# Patient Record
Sex: Female | Born: 1945 | Race: Black or African American | Hispanic: No | Marital: Single | Smoking: Former smoker
Health system: Southern US, Community
[De-identification: ages and names within clinical notes are randomized; demographics above are authoritative.]

## PROBLEM LIST (undated history)

## (undated) DIAGNOSIS — I10 Essential (primary) hypertension: Secondary | ICD-10-CM

## (undated) DIAGNOSIS — I517 Cardiomegaly: Secondary | ICD-10-CM

## (undated) DIAGNOSIS — I739 Peripheral vascular disease, unspecified: Secondary | ICD-10-CM

## (undated) DIAGNOSIS — I422 Other hypertrophic cardiomyopathy: Secondary | ICD-10-CM

## (undated) DIAGNOSIS — M1990 Unspecified osteoarthritis, unspecified site: Secondary | ICD-10-CM

## (undated) DIAGNOSIS — I441 Atrioventricular block, second degree: Secondary | ICD-10-CM

## (undated) DIAGNOSIS — E785 Hyperlipidemia, unspecified: Secondary | ICD-10-CM

## (undated) DIAGNOSIS — I779 Disorder of arteries and arterioles, unspecified: Secondary | ICD-10-CM

## (undated) DIAGNOSIS — Z9289 Personal history of other medical treatment: Secondary | ICD-10-CM

## (undated) DIAGNOSIS — I2510 Atherosclerotic heart disease of native coronary artery without angina pectoris: Secondary | ICD-10-CM

## (undated) DIAGNOSIS — Z950 Presence of cardiac pacemaker: Secondary | ICD-10-CM

## (undated) HISTORY — DX: Personal history of other medical treatment: Z92.89

## (undated) HISTORY — DX: Atrioventricular block, second degree: I44.1

## (undated) HISTORY — PX: ABDOMINAL HYSTERECTOMY: SHX81

## (undated) HISTORY — DX: Cardiomegaly: I51.7

## (undated) HISTORY — DX: Other hypertrophic cardiomyopathy: I42.2

---

## 2004-02-02 ENCOUNTER — Emergency Department: Payer: Self-pay | Admitting: Internal Medicine

## 2004-03-20 ENCOUNTER — Ambulatory Visit: Payer: Self-pay | Admitting: Family Medicine

## 2004-03-22 ENCOUNTER — Ambulatory Visit: Payer: Self-pay | Admitting: Family Medicine

## 2004-05-15 ENCOUNTER — Ambulatory Visit: Payer: Self-pay | Admitting: Neurology

## 2004-10-22 ENCOUNTER — Ambulatory Visit: Payer: Self-pay | Admitting: Family Medicine

## 2004-11-13 ENCOUNTER — Ambulatory Visit: Payer: Self-pay | Admitting: Family Medicine

## 2004-12-20 ENCOUNTER — Ambulatory Visit: Payer: Self-pay | Admitting: Family Medicine

## 2005-03-05 ENCOUNTER — Other Ambulatory Visit: Payer: Self-pay

## 2005-04-07 ENCOUNTER — Inpatient Hospital Stay: Payer: Self-pay | Admitting: Obstetrics and Gynecology

## 2006-03-18 ENCOUNTER — Ambulatory Visit: Payer: Self-pay | Admitting: Family Medicine

## 2006-03-23 ENCOUNTER — Ambulatory Visit: Payer: Self-pay | Admitting: Family Medicine

## 2006-04-22 ENCOUNTER — Other Ambulatory Visit: Payer: Self-pay

## 2006-04-22 ENCOUNTER — Ambulatory Visit: Payer: Self-pay | Admitting: General Surgery

## 2006-04-29 ENCOUNTER — Ambulatory Visit: Payer: Self-pay | Admitting: General Surgery

## 2006-10-23 ENCOUNTER — Ambulatory Visit: Payer: Self-pay | Admitting: Unknown Physician Specialty

## 2016-10-03 ENCOUNTER — Observation Stay: Admit: 2016-10-03 | Payer: No Typology Code available for payment source

## 2016-10-03 ENCOUNTER — Observation Stay
Admission: EM | Admit: 2016-10-03 | Discharge: 2016-10-05 | Disposition: A | Discharge status: Hospice | Payer: No Typology Code available for payment source | Attending: Internal Medicine | Admitting: Internal Medicine

## 2016-10-03 ENCOUNTER — Encounter: Payer: Self-pay | Admitting: Emergency Medicine

## 2016-10-03 ENCOUNTER — Emergency Department: Payer: No Typology Code available for payment source

## 2016-10-03 DIAGNOSIS — I441 Atrioventricular block, second degree: Secondary | ICD-10-CM | POA: Diagnosis not present

## 2016-10-03 DIAGNOSIS — I6523 Occlusion and stenosis of bilateral carotid arteries: Secondary | ICD-10-CM

## 2016-10-03 DIAGNOSIS — N179 Acute kidney failure, unspecified: Secondary | ICD-10-CM | POA: Diagnosis not present

## 2016-10-03 DIAGNOSIS — I700 Atherosclerosis of aorta: Secondary | ICD-10-CM | POA: Diagnosis not present

## 2016-10-03 DIAGNOSIS — Y9389 Activity, other specified: Secondary | ICD-10-CM | POA: Insufficient documentation

## 2016-10-03 DIAGNOSIS — M1990 Unspecified osteoarthritis, unspecified site: Secondary | ICD-10-CM | POA: Insufficient documentation

## 2016-10-03 DIAGNOSIS — Y92481 Parking lot as the place of occurrence of the external cause: Secondary | ICD-10-CM | POA: Insufficient documentation

## 2016-10-03 DIAGNOSIS — E785 Hyperlipidemia, unspecified: Secondary | ICD-10-CM | POA: Insufficient documentation

## 2016-10-03 DIAGNOSIS — Z8673 Personal history of transient ischemic attack (TIA), and cerebral infarction without residual deficits: Secondary | ICD-10-CM | POA: Insufficient documentation

## 2016-10-03 DIAGNOSIS — I119 Hypertensive heart disease without heart failure: Secondary | ICD-10-CM

## 2016-10-03 DIAGNOSIS — R55 Syncope and collapse: Secondary | ICD-10-CM | POA: Diagnosis present

## 2016-10-03 DIAGNOSIS — Z79899 Other long term (current) drug therapy: Secondary | ICD-10-CM | POA: Insufficient documentation

## 2016-10-03 DIAGNOSIS — Z720 Tobacco use: Secondary | ICD-10-CM

## 2016-10-03 DIAGNOSIS — Z7982 Long term (current) use of aspirin: Secondary | ICD-10-CM | POA: Diagnosis not present

## 2016-10-03 DIAGNOSIS — R9431 Abnormal electrocardiogram [ECG] [EKG]: Secondary | ICD-10-CM

## 2016-10-03 DIAGNOSIS — F17210 Nicotine dependence, cigarettes, uncomplicated: Secondary | ICD-10-CM | POA: Diagnosis not present

## 2016-10-03 DIAGNOSIS — Z90710 Acquired absence of both cervix and uterus: Secondary | ICD-10-CM | POA: Insufficient documentation

## 2016-10-03 DIAGNOSIS — J322 Chronic ethmoidal sinusitis: Secondary | ICD-10-CM | POA: Insufficient documentation

## 2016-10-03 DIAGNOSIS — Y998 Other external cause status: Secondary | ICD-10-CM | POA: Diagnosis not present

## 2016-10-03 DIAGNOSIS — I10 Essential (primary) hypertension: Secondary | ICD-10-CM | POA: Insufficient documentation

## 2016-10-03 HISTORY — DX: Unspecified osteoarthritis, unspecified site: M19.90

## 2016-10-03 LAB — URINALYSIS, COMPLETE (UACMP) WITH MICROSCOPIC
Bacteria, UA: NONE SEEN
Bilirubin Urine: NEGATIVE
GLUCOSE, UA: NEGATIVE mg/dL
HGB URINE DIPSTICK: NEGATIVE
KETONES UR: NEGATIVE mg/dL
LEUKOCYTES UA: NEGATIVE
NITRITE: NEGATIVE
PH: 6 (ref 5.0–8.0)
Protein, ur: NEGATIVE mg/dL
Specific Gravity, Urine: 1.011 (ref 1.005–1.030)

## 2016-10-03 LAB — HEMOGLOBIN A1C
HEMOGLOBIN A1C: 5.8 % — AB (ref 4.8–5.6)
Hgb A1c MFr Bld: 5.8 % — ABNORMAL HIGH (ref 4.8–5.6)
Mean Plasma Glucose: 119.76 mg/dL
Mean Plasma Glucose: 119.76 mg/dL

## 2016-10-03 LAB — TSH: TSH: 0.577 u[IU]/mL (ref 0.350–4.500)

## 2016-10-03 LAB — LIPID PANEL
Cholesterol: 275 mg/dL — ABNORMAL HIGH (ref 0–200)
HDL: 54 mg/dL (ref >40)
LDL CALC: 185 mg/dL — AB (ref 0–99)
TRIGLYCERIDES: 178 mg/dL — AB (ref <150)
Total CHOL/HDL Ratio: 5.1 RATIO
VLDL: 36 mg/dL (ref 0–40)

## 2016-10-03 LAB — COMPREHENSIVE METABOLIC PANEL
ALT: 17 U/L (ref 14–54)
AST: 26 U/L (ref 15–41)
Albumin: 4.1 g/dL (ref 3.5–5.0)
Alkaline Phosphatase: 74 U/L (ref 38–126)
Anion gap: 13 (ref 5–15)
BILIRUBIN TOTAL: 1 mg/dL (ref 0.3–1.2)
BUN: 28 mg/dL — AB (ref 6–20)
CO2: 22 mmol/L (ref 22–32)
CREATININE: 1.32 mg/dL — AB (ref 0.44–1.00)
Calcium: 9.7 mg/dL (ref 8.9–10.3)
Chloride: 108 mmol/L (ref 101–111)
GFR calc Af Amer: 45 mL/min — ABNORMAL LOW (ref >60)
GFR, EST NON AFRICAN AMERICAN: 39 mL/min — AB (ref >60)
Glucose, Bld: 183 mg/dL — ABNORMAL HIGH (ref 65–99)
POTASSIUM: 3.7 mmol/L (ref 3.5–5.1)
Sodium: 143 mmol/L (ref 135–145)
TOTAL PROTEIN: 8.1 g/dL (ref 6.5–8.1)

## 2016-10-03 LAB — CBC
HCT: 44 % (ref 35.0–47.0)
HEMOGLOBIN: 14.9 g/dL (ref 12.0–16.0)
MCH: 29.5 pg (ref 26.0–34.0)
MCHC: 33.8 g/dL (ref 32.0–36.0)
MCV: 87.1 fL (ref 80.0–100.0)
Platelets: 327 10*3/uL (ref 150–440)
RBC: 5.05 MIL/uL (ref 3.80–5.20)
RDW: 13.2 % (ref 11.5–14.5)
WBC: 9.7 10*3/uL (ref 3.6–11.0)

## 2016-10-03 LAB — TROPONIN I: TROPONIN I: 0.05 ng/mL — AB (ref <0.03)

## 2016-10-03 MED ORDER — ENOXAPARIN SODIUM 40 MG/0.4ML ~~LOC~~ SOLN
40.0000 mg | SUBCUTANEOUS | Status: DC
  Filled 2016-10-03: qty 0.4

## 2016-10-03 MED ORDER — NICOTINE 21 MG/24HR TD PT24
MEDICATED_PATCH | TRANSDERMAL | Status: AC
  Filled 2016-10-03: qty 1

## 2016-10-03 MED ORDER — ACETAMINOPHEN 650 MG RE SUPP: 650.0000 mg | Freq: Four times a day (QID) | RECTAL | Status: DC | PRN

## 2016-10-03 MED ORDER — NICOTINE 21 MG/24HR TD PT24
21.0000 mg | MEDICATED_PATCH | Freq: Every day | TRANSDERMAL | Status: DC
  Administered 2016-10-03 – 2016-10-05 (×3): 21 mg via TRANSDERMAL
  Filled 2016-10-03 (×2): qty 1

## 2016-10-03 MED ORDER — ONDANSETRON HCL 4 MG PO TABS: 4.0000 mg | ORAL_TABLET | Freq: Four times a day (QID) | ORAL | Status: DC | PRN

## 2016-10-03 MED ORDER — HYDRALAZINE HCL 20 MG/ML IJ SOLN
10.0000 mg | Freq: Four times a day (QID) | INTRAMUSCULAR | Status: DC | PRN
  Administered 2016-10-03 – 2016-10-05 (×3): 10 mg via INTRAVENOUS
  Filled 2016-10-03 (×2): qty 1

## 2016-10-03 MED ORDER — HYDRALAZINE HCL 20 MG/ML IJ SOLN
INTRAMUSCULAR | Status: AC
  Administered 2016-10-04: 10 mg via INTRAVENOUS
  Filled 2016-10-03: qty 1

## 2016-10-03 MED ORDER — ACETAMINOPHEN 325 MG PO TABS
650.0000 mg | ORAL_TABLET | Freq: Four times a day (QID) | ORAL | Status: DC | PRN
  Administered 2016-10-05: 650 mg via ORAL
  Filled 2016-10-03: qty 2

## 2016-10-03 MED ORDER — SODIUM CHLORIDE 0.9 % IV BOLUS (SEPSIS): 500.0000 mL | Freq: Once | INTRAVENOUS | Status: DC

## 2016-10-03 MED ORDER — SODIUM CHLORIDE 0.9 % IV SOLN
1000.0000 mL | Freq: Once | INTRAVENOUS | Status: AC
  Administered 2016-10-03: 1000 mL via INTRAVENOUS

## 2016-10-03 MED ORDER — ONDANSETRON HCL 4 MG/2ML IJ SOLN
4.0000 mg | Freq: Four times a day (QID) | INTRAMUSCULAR | Status: DC | PRN
Start: 2016-10-03 — End: 2016-10-05

## 2016-10-03 MED ORDER — SODIUM CHLORIDE 0.9 % IV SOLN
INTRAVENOUS | Status: DC
  Administered 2016-10-03 – 2016-10-04 (×2): via INTRAVENOUS
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
## 2016-10-03 NOTE — ED Notes (Signed)
Assisted to bathroom. Ambulated with steady gait. NAD
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
## 2016-10-03 NOTE — ED Notes (Signed)
Assisted to bedpan.  Unable to go to bathroom at this time

## 2016-10-03 NOTE — ED Notes (Signed)
Given water, ok per dr Roxan Hockey
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
## 2016-10-04 ENCOUNTER — Observation Stay (HOSPITAL_BASED_OUTPATIENT_CLINIC_OR_DEPARTMENT_OTHER)
Admit: 2016-10-04 | Discharge: 2016-10-04 | Disposition: A | Discharge status: Home / Self Care | Payer: No Typology Code available for payment source | Attending: Internal Medicine | Admitting: Internal Medicine

## 2016-10-04 ENCOUNTER — Observation Stay: Admit: 2016-10-04 | Payer: No Typology Code available for payment source

## 2016-10-04 ENCOUNTER — Observation Stay: Payer: No Typology Code available for payment source

## 2016-10-04 DIAGNOSIS — I6523 Occlusion and stenosis of bilateral carotid arteries: Secondary | ICD-10-CM

## 2016-10-04 DIAGNOSIS — R55 Syncope and collapse: Secondary | ICD-10-CM | POA: Diagnosis not present

## 2016-10-04 DIAGNOSIS — I10 Essential (primary) hypertension: Secondary | ICD-10-CM | POA: Diagnosis not present

## 2016-10-04 DIAGNOSIS — F17200 Nicotine dependence, unspecified, uncomplicated: Secondary | ICD-10-CM

## 2016-10-04 DIAGNOSIS — I119 Hypertensive heart disease without heart failure: Secondary | ICD-10-CM

## 2016-10-04 DIAGNOSIS — I441 Atrioventricular block, second degree: Secondary | ICD-10-CM

## 2016-10-04 LAB — CBC
HCT: 39.5 % (ref 35.0–47.0)
Hemoglobin: 13.4 g/dL (ref 12.0–16.0)
MCH: 29.3 pg (ref 26.0–34.0)
MCHC: 33.9 g/dL (ref 32.0–36.0)
MCV: 86.5 fL (ref 80.0–100.0)
PLATELETS: 261 10*3/uL (ref 150–440)
RBC: 4.57 MIL/uL (ref 3.80–5.20)
RDW: 13.3 % (ref 11.5–14.5)
WBC: 7.5 10*3/uL (ref 3.6–11.0)

## 2016-10-04 LAB — ECHOCARDIOGRAM COMPLETE
Height: 66 in
Weight: 2272 oz

## 2016-10-04 LAB — BASIC METABOLIC PANEL
Anion gap: 6 (ref 5–15)
BUN: 20 mg/dL (ref 6–20)
CHLORIDE: 110 mmol/L (ref 101–111)
CO2: 24 mmol/L (ref 22–32)
CREATININE: 0.56 mg/dL (ref 0.44–1.00)
Calcium: 8.5 mg/dL — ABNORMAL LOW (ref 8.9–10.3)
GFR calc Af Amer: >60 mL/min (ref >60)
GFR calc non Af Amer: >60 mL/min (ref >60)
GLUCOSE: 112 mg/dL — AB (ref 65–99)
Potassium: 3.5 mmol/L (ref 3.5–5.1)
Sodium: 140 mmol/L (ref 135–145)

## 2016-10-04 LAB — TROPONIN I
TROPONIN I: 0.09 ng/mL — AB (ref <0.03)
Troponin I: 0.07 ng/mL (ref <0.03)

## 2016-10-04 MED ORDER — LOSARTAN POTASSIUM 50 MG PO TABS
100.0000 mg | ORAL_TABLET | Freq: Every day | ORAL | Status: DC
  Administered 2016-10-04 – 2016-10-05 (×2): 100 mg via ORAL
  Filled 2016-10-04 (×2): qty 2

## 2016-10-04 MED ORDER — HYDRALAZINE HCL 25 MG PO TABS
25.0000 mg | ORAL_TABLET | Freq: Three times a day (TID) | ORAL | Status: DC
  Administered 2016-10-04 (×2): 25 mg via ORAL
  Filled 2016-10-04 (×3): qty 1

## 2016-10-04 MED ORDER — ROSUVASTATIN CALCIUM 10 MG PO TABS
20.0000 mg | ORAL_TABLET | Freq: Every day | ORAL | Status: DC
  Administered 2016-10-04: 20 mg via ORAL
  Filled 2016-10-04: qty 2

## 2016-10-04 MED ORDER — METOPROLOL TARTRATE 25 MG PO TABS: 25.0000 mg | ORAL_TABLET | Freq: Two times a day (BID) | ORAL | Status: DC

## 2016-10-04 NOTE — Progress Notes (Signed)
DR [name]  And DR Luberta Mutter was made aware of pt change In rhythm. bata-blocker was held and will continue to monitor after reviewing pt strip
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
## 2016-10-04 NOTE — Progress Notes (Signed)
She has episodes of second-degree heart block on telemetr,d/w  Dr. [name].. Continue to monitor. Discontinue beta blocker and patient did not. Beta blocker dose yet.
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
## 2016-10-04 NOTE — Care Management Obs Status (Signed)
MEDICARE OBSERVATION STATUS NOTIFICATION   Patient Details  Name: Shannon Chen MRN: [id number] Date of Birth: 1945/10/22   Medicare Observation Status Notification Given:  Yes    Maley Venezia A, RN 10/04/2016, 4:02 PM

## 2016-10-04 NOTE — Progress Notes (Signed)
DR Luberta Mutter was informed about pt having 5 beat of v-tach , pt asymptomatic and resting at the time of event , no new order will continue to monitor.

## 2016-10-05 ENCOUNTER — Encounter (HOSPITAL_COMMUNITY): Payer: Self-pay | Admitting: Cardiology

## 2016-10-05 ENCOUNTER — Inpatient Hospital Stay (HOSPITAL_COMMUNITY)
Admission: AD | Admit: 2016-10-05 | Discharge: 2016-10-09 | DRG: 244 | Disposition: A | Discharge status: Home Health | Payer: Medicare Other | Source: Other Acute Inpatient Hospital | Attending: Internal Medicine | Admitting: Internal Medicine

## 2016-10-05 DIAGNOSIS — E785 Hyperlipidemia, unspecified: Secondary | ICD-10-CM | POA: Diagnosis present

## 2016-10-05 DIAGNOSIS — R9431 Abnormal electrocardiogram [ECG] [EKG]: Secondary | ICD-10-CM

## 2016-10-05 DIAGNOSIS — K0530 Chronic periodontitis, unspecified: Secondary | ICD-10-CM | POA: Diagnosis present

## 2016-10-05 DIAGNOSIS — I119 Hypertensive heart disease without heart failure: Secondary | ICD-10-CM | POA: Diagnosis present

## 2016-10-05 DIAGNOSIS — R55 Syncope and collapse: Secondary | ICD-10-CM | POA: Diagnosis present

## 2016-10-05 DIAGNOSIS — I10 Essential (primary) hypertension: Secondary | ICD-10-CM | POA: Diagnosis not present

## 2016-10-05 DIAGNOSIS — I441 Atrioventricular block, second degree: Secondary | ICD-10-CM | POA: Diagnosis not present

## 2016-10-05 DIAGNOSIS — R739 Hyperglycemia, unspecified: Secondary | ICD-10-CM | POA: Diagnosis present

## 2016-10-05 DIAGNOSIS — I110 Hypertensive heart disease with heart failure: Secondary | ICD-10-CM | POA: Diagnosis present

## 2016-10-05 DIAGNOSIS — F17210 Nicotine dependence, cigarettes, uncomplicated: Secondary | ICD-10-CM | POA: Diagnosis present

## 2016-10-05 DIAGNOSIS — K089 Disorder of teeth and supporting structures, unspecified: Secondary | ICD-10-CM | POA: Diagnosis not present

## 2016-10-05 DIAGNOSIS — Z8249 Family history of ischemic heart disease and other diseases of the circulatory system: Secondary | ICD-10-CM

## 2016-10-05 DIAGNOSIS — E7800 Pure hypercholesterolemia, unspecified: Secondary | ICD-10-CM | POA: Diagnosis not present

## 2016-10-05 DIAGNOSIS — Z79899 Other long term (current) drug therapy: Secondary | ICD-10-CM | POA: Diagnosis not present

## 2016-10-05 DIAGNOSIS — M264 Malocclusion, unspecified: Secondary | ICD-10-CM | POA: Diagnosis present

## 2016-10-05 DIAGNOSIS — I509 Heart failure, unspecified: Secondary | ICD-10-CM | POA: Diagnosis present

## 2016-10-05 DIAGNOSIS — E860 Dehydration: Secondary | ICD-10-CM | POA: Diagnosis present

## 2016-10-05 DIAGNOSIS — Z720 Tobacco use: Secondary | ICD-10-CM | POA: Diagnosis present

## 2016-10-05 DIAGNOSIS — Z01818 Encounter for other preprocedural examination: Secondary | ICD-10-CM | POA: Diagnosis not present

## 2016-10-05 DIAGNOSIS — Z950 Presence of cardiac pacemaker: Secondary | ICD-10-CM

## 2016-10-05 DIAGNOSIS — I6521 Occlusion and stenosis of right carotid artery: Secondary | ICD-10-CM | POA: Diagnosis not present

## 2016-10-05 DIAGNOSIS — K029 Dental caries, unspecified: Secondary | ICD-10-CM | POA: Diagnosis present

## 2016-10-05 DIAGNOSIS — I6523 Occlusion and stenosis of bilateral carotid arteries: Secondary | ICD-10-CM | POA: Diagnosis present

## 2016-10-05 DIAGNOSIS — K045 Chronic apical periodontitis: Secondary | ICD-10-CM | POA: Diagnosis present

## 2016-10-05 DIAGNOSIS — I442 Atrioventricular block, complete: Secondary | ICD-10-CM | POA: Diagnosis present

## 2016-10-05 HISTORY — DX: Hyperlipidemia, unspecified: E78.5

## 2016-10-05 HISTORY — DX: Disorder of arteries and arterioles, unspecified: I77.9

## 2016-10-05 HISTORY — DX: Essential (primary) hypertension: I10

## 2016-10-05 HISTORY — DX: Peripheral vascular disease, unspecified: I73.9

## 2016-10-05 LAB — CBC
HCT: 40.5 % (ref 36.0–46.0)
Hemoglobin: 13.6 g/dL (ref 12.0–15.0)
MCH: 28.9 pg (ref 26.0–34.0)
MCHC: 33.6 g/dL (ref 30.0–36.0)
MCV: 86.2 fL (ref 78.0–100.0)
PLATELETS: 280 10*3/uL (ref 150–400)
RBC: 4.7 MIL/uL (ref 3.87–5.11)
RDW: 13 % (ref 11.5–15.5)
WBC: 7.2 10*3/uL (ref 4.0–10.5)

## 2016-10-05 LAB — MRSA PCR SCREENING: MRSA BY PCR: NEGATIVE

## 2016-10-05 LAB — CREATININE, SERUM: CREATININE: 0.73 mg/dL (ref 0.44–1.00)

## 2016-10-05 LAB — MAGNESIUM: Magnesium: 2 mg/dL (ref 1.7–2.4)

## 2016-10-05 MED ORDER — NITROGLYCERIN 0.4 MG SL SUBL: 0.4000 mg | SUBLINGUAL_TABLET | SUBLINGUAL | Status: DC | PRN

## 2016-10-05 MED ORDER — HYDRALAZINE HCL 50 MG PO TABS: 100.0000 mg | ORAL_TABLET | Freq: Three times a day (TID) | ORAL | Status: DC

## 2016-10-05 MED ORDER — ONDANSETRON HCL 4 MG/2ML IJ SOLN: 4.0000 mg | Freq: Four times a day (QID) | INTRAMUSCULAR | Status: DC | PRN

## 2016-10-05 MED ORDER — ROSUVASTATIN CALCIUM 20 MG PO TABS: 20.0000 mg | ORAL_TABLET | Freq: Every day | ORAL | 0 refills | Status: DC

## 2016-10-05 MED ORDER — ROSUVASTATIN CALCIUM 20 MG PO TABS
40.0000 mg | ORAL_TABLET | Freq: Every day | ORAL | Status: DC
  Administered 2016-10-05 – 2016-10-09 (×5): 40 mg via ORAL
  Filled 2016-10-05 (×5): qty 2

## 2016-10-05 MED ORDER — LOSARTAN POTASSIUM 50 MG PO TABS
100.0000 mg | ORAL_TABLET | Freq: Every day | ORAL | Status: DC
  Administered 2016-10-05 – 2016-10-07 (×3): 100 mg via ORAL
  Filled 2016-10-05 (×3): qty 2

## 2016-10-05 MED ORDER — HYDRALAZINE HCL 20 MG/ML IJ SOLN
20.0000 mg | Freq: Once | INTRAMUSCULAR | Status: AC
  Administered 2016-10-05: 20 mg via INTRAVENOUS
  Filled 2016-10-05: qty 1

## 2016-10-05 MED ORDER — HYDRALAZINE HCL 25 MG PO TABS: 25.0000 mg | ORAL_TABLET | Freq: Three times a day (TID) | ORAL | 0 refills | Status: DC

## 2016-10-05 MED ORDER — ASPIRIN EC 81 MG PO TBEC
81.0000 mg | DELAYED_RELEASE_TABLET | Freq: Every day | ORAL | Status: DC
  Administered 2016-10-06 – 2016-10-09 (×4): 81 mg via ORAL
  Filled 2016-10-05 (×4): qty 1

## 2016-10-05 MED ORDER — HYDROCODONE-ACETAMINOPHEN 5-325 MG PO TABS: 1.0000 | ORAL_TABLET | ORAL | Status: DC | PRN

## 2016-10-05 MED ORDER — NICOTINE 21 MG/24HR TD PT24
21.0000 mg | MEDICATED_PATCH | TRANSDERMAL | Status: DC
  Administered 2016-10-05 – 2016-10-09 (×5): 21 mg via TRANSDERMAL
  Filled 2016-10-05 (×5): qty 1

## 2016-10-05 MED ORDER — HYDRALAZINE HCL 50 MG PO TABS
50.0000 mg | ORAL_TABLET | Freq: Four times a day (QID) | ORAL | Status: DC
Start: 2016-10-05 — End: 2016-10-08
  Administered 2016-10-05 – 2016-10-08 (×10): 50 mg via ORAL
  Filled 2016-10-05 (×10): qty 1

## 2016-10-05 MED ORDER — ASPIRIN 81 MG PO CHEW
324.0000 mg | CHEWABLE_TABLET | ORAL | Status: AC
  Filled 2016-10-05: qty 4

## 2016-10-05 MED ORDER — LOSARTAN POTASSIUM 100 MG PO TABS: 100.0000 mg | ORAL_TABLET | Freq: Every day | ORAL | 0 refills | Status: DC

## 2016-10-05 MED ORDER — HYDRALAZINE HCL 20 MG/ML IJ SOLN
10.0000 mg | Freq: Four times a day (QID) | INTRAMUSCULAR | Status: DC | PRN
  Administered 2016-10-06 – 2016-10-08 (×2): 10 mg via INTRAVENOUS
  Filled 2016-10-05 (×2): qty 1

## 2016-10-05 MED ORDER — ASPIRIN EC 81 MG PO TBEC: 81.0000 mg | DELAYED_RELEASE_TABLET | Freq: Every day | ORAL | 2 refills | Status: AC

## 2016-10-05 MED ORDER — ENOXAPARIN SODIUM 40 MG/0.4ML ~~LOC~~ SOLN
40.0000 mg | SUBCUTANEOUS | Status: DC
  Administered 2016-10-05: 40 mg via SUBCUTANEOUS
  Filled 2016-10-05: qty 0.4

## 2016-10-05 MED ORDER — ASPIRIN 300 MG RE SUPP: 300.0000 mg | RECTAL | Status: AC

## 2016-10-05 MED ORDER — ZOLPIDEM TARTRATE 5 MG PO TABS: 5.0000 mg | ORAL_TABLET | Freq: Every evening | ORAL | Status: DC | PRN

## 2016-10-05 MED ORDER — ACETAMINOPHEN 325 MG PO TABS: 650.0000 mg | ORAL_TABLET | ORAL | Status: DC | PRN
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
## 2016-10-05 NOTE — Progress Notes (Signed)
Nursing informed of an 8-Beat run of V-Tach per CCMD. MD notified and new order to check mag level placed. Nursing will continue to assess.

## 2016-10-06 ENCOUNTER — Inpatient Hospital Stay (HOSPITAL_COMMUNITY): Payer: Medicare Other

## 2016-10-06 ENCOUNTER — Encounter (HOSPITAL_COMMUNITY): Payer: Self-pay | Admitting: Dentistry

## 2016-10-06 DIAGNOSIS — I10 Essential (primary) hypertension: Secondary | ICD-10-CM

## 2016-10-06 DIAGNOSIS — R55 Syncope and collapse: Secondary | ICD-10-CM

## 2016-10-06 DIAGNOSIS — Z01818 Encounter for other preprocedural examination: Secondary | ICD-10-CM

## 2016-10-06 DIAGNOSIS — I6523 Occlusion and stenosis of bilateral carotid arteries: Secondary | ICD-10-CM

## 2016-10-06 DIAGNOSIS — I441 Atrioventricular block, second degree: Secondary | ICD-10-CM

## 2016-10-06 DIAGNOSIS — I6521 Occlusion and stenosis of right carotid artery: Secondary | ICD-10-CM

## 2016-10-06 LAB — BASIC METABOLIC PANEL
ANION GAP: 6 (ref 5–15)
BUN: 20 mg/dL (ref 6–20)
CHLORIDE: 108 mmol/L (ref 101–111)
CO2: 27 mmol/L (ref 22–32)
Calcium: 8.9 mg/dL (ref 8.9–10.3)
Creatinine, Ser: 0.81 mg/dL (ref 0.44–1.00)
GFR calc non Af Amer: >60 mL/min (ref >60)
GLUCOSE: 124 mg/dL — AB (ref 65–99)
POTASSIUM: 3.8 mmol/L (ref 3.5–5.1)
Sodium: 141 mmol/L (ref 135–145)

## 2016-10-06 LAB — GLUCOSE, CAPILLARY: Glucose-Capillary: 107 mg/dL — ABNORMAL HIGH (ref 65–99)

## 2016-10-06 LAB — PROTIME-INR
INR: 1.04
PROTHROMBIN TIME: 13.6 s (ref 11.4–15.2)

## 2016-10-06 MED ORDER — AMLODIPINE BESYLATE 5 MG PO TABS
5.0000 mg | ORAL_TABLET | Freq: Every day | ORAL | Status: DC
  Administered 2016-10-06 – 2016-10-07 (×2): 5 mg via ORAL
  Filled 2016-10-06 (×2): qty 1

## 2016-10-06 MED ORDER — CEFAZOLIN SODIUM-DEXTROSE 2-4 GM/100ML-% IV SOLN: 2.0000 g | INTRAVENOUS | Status: DC

## 2016-10-06 MED ORDER — SODIUM CHLORIDE 0.9% FLUSH
3.0000 mL | Freq: Two times a day (BID) | INTRAVENOUS | Status: DC
  Administered 2016-10-07: 3 mL via INTRAVENOUS

## 2016-10-06 MED ORDER — SODIUM CHLORIDE 0.9% FLUSH: 3.0000 mL | INTRAVENOUS | Status: DC | PRN

## 2016-10-06 MED ORDER — CHLORHEXIDINE GLUCONATE 4 % EX LIQD: 60.0000 mL | Freq: Once | CUTANEOUS | Status: AC

## 2016-10-06 MED ORDER — CHLORHEXIDINE GLUCONATE 4 % EX LIQD
60.0000 mL | Freq: Once | CUTANEOUS | Status: AC
  Filled 2016-10-06: qty 60

## 2016-10-06 MED ORDER — SODIUM CHLORIDE 0.9 % IV SOLN
INTRAVENOUS | Status: DC
  Administered 2016-10-06: 09:00:00 via INTRAVENOUS
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
## 2016-10-06 NOTE — Progress Notes (Signed)
Cardiology paged and made aware of Shannon Chen pauses. 3.7 second and 3.6 second pause around 2330 on 10/05/16. Shortly followed by a 5 second pause at 0112. EKG obtained. Patient is asymptomatic. Will continue to closely monitor.

## 2016-10-07 ENCOUNTER — Encounter (HOSPITAL_COMMUNITY)
Admission: AD | Disposition: A | Discharge status: Home Health | Payer: Self-pay | Source: Other Acute Inpatient Hospital | Attending: Internal Medicine

## 2016-10-07 DIAGNOSIS — E7800 Pure hypercholesterolemia, unspecified: Secondary | ICD-10-CM

## 2016-10-07 SURGERY — PACEMAKER IMPLANT

## 2016-10-07 MED ORDER — SODIUM CHLORIDE 0.9 % IV SOLN: 250.0000 mL | INTRAVENOUS | Status: DC

## 2016-10-07 MED ORDER — AMLODIPINE BESYLATE 10 MG PO TABS
10.0000 mg | ORAL_TABLET | Freq: Every day | ORAL | Status: DC
  Administered 2016-10-08 – 2016-10-09 (×2): 10 mg via ORAL
  Filled 2016-10-07 (×2): qty 1

## 2016-10-07 MED ORDER — SODIUM CHLORIDE 0.9 % IR SOLN: 80.0000 mg | Status: DC

## 2016-10-07 MED ORDER — AMLODIPINE BESYLATE 5 MG PO TABS
5.0000 mg | ORAL_TABLET | Freq: Once | ORAL | Status: AC
  Administered 2016-10-07: 5 mg via ORAL
  Filled 2016-10-07: qty 1

## 2016-10-07 NOTE — Progress Notes (Signed)
Progress Note  Patient Name: Shannon Chen Date of Encounter: 10/07/2016  Primary Cardiologist: Dr. [name] Primary Electrophysiologist: Dr. Elberta Fortis  Subjective   Feeling overwhelmed but feeling well physically.  She denies chest pain or shortness of breath.  No dizziness or palpitaitons.   Inpatient Medications    Scheduled Meds: . amLODipine  5 mg Oral Daily  . aspirin EC  81 mg Oral Daily  . gentamicin irrigation  80 mg Irrigation To SSTC  . hydrALAZINE  50 mg Oral Q6H  . losartan  100 mg Oral Daily  . nicotine  21 mg Transdermal Q24H  . rosuvastatin  40 mg Oral q1800  . sodium chloride flush  3 mL Intravenous Q12H   Continuous Infusions: . sodium chloride 50 mL/hr at 10/07/16 0800  . sodium chloride Stopped (10/07/16 0522)  .  ceFAZolin (ANCEF) IV     PRN Meds: acetaminophen, hydrALAZINE, HYDROcodone-acetaminophen, nitroGLYCERIN, ondansetron (ZOFRAN) IV, sodium chloride flush, zolpidem   Vital Signs    Vitals:   10/06/16 2304 10/06/16 2321 10/07/16 0335 10/07/16 0745  BP: (!) 170/71 (!) 150/74 (!) 143/59 (!) 161/78  Pulse: 71  95 87  Resp: 20  17 16   Temp: 98 F (36.7 C)  97.9 F (36.6 C) 98.7 F (37.1 C)  TempSrc: Oral  Oral Oral  SpO2: 97%  100% 100%  Weight:      Height:        Intake/Output Summary (Last 24 hours) at 10/07/16 0916 Last data filed at 10/07/16 0519  Gross per 24 hour  Intake             1475 ml  Output              475 ml  Net             1000 ml   Filed Weights   10/05/16 1622  Weight: 73.3 kg (161 lb 9.6 oz)    Telemetry    Sinus rhythm.  Mobitz II - Personally Reviewed  ECG    Sinus rhythm. Rate 91 bpm.  LVH with repolarization abnormalities  - Personally Reviewed  Physical Exam   GEN: No acute distress.   HEENT: Poor dentition Neck: No JVD Cardiac: RRR, no murmurs, rubs, or gallops.  Respiratory: Clear to auscultation bilaterally. GI: Soft, nontender, non-distended  MS: No edema; No deformity. Neuro:   Nonfocal  Psych: Normal affect   Labs    Chemistry Recent Labs Lab 10/03/16 1422 10/04/16 0637 10/05/16 1516 10/06/16 0442  NA 143 140  --  141  K 3.7 3.5  --  3.8  CL 108 110  --  108  CO2 22 24  --  27  GLUCOSE 183* 112*  --  124*  BUN 28* 20  --  20  CREATININE 1.32* 0.56 0.73 0.81  CALCIUM 9.7 8.5*  --  8.9  PROT 8.1  --   --   --   ALBUMIN 4.1  --   --   --   AST 26  --   --   --   ALT 17  --   --   --   ALKPHOS 74  --   --   --   BILITOT 1.0  --   --   --   GFRNONAA 39* >60 >60 >60  GFRAA 45* >60 >60 >60  ANIONGAP 13 6  --  6     Hematology Recent Labs Lab 10/03/16 1422 10/04/16 6811 10/05/16 1516  WBC 9.7 7.5 7.2  RBC 5.05 4.57 4.70  HGB 14.9 13.4 13.6  HCT 44.0 39.5 40.5  MCV 87.1 86.5 86.2  MCH 29.5 29.3 28.9  MCHC 33.8 33.9 33.6  RDW 13.2 13.3 13.0  PLT 327 261 280    Cardiac Enzymes Recent Labs Lab 10/03/16 1422 10/03/16 1841 10/04/16 0033 10/04/16 0637  TROPONINI <0.03 0.05* 0.09* 0.07*   No results for input(s): TROPIPOC in the last 168 hours.   BNPNo results for input(s): BNP, PROBNP in the last 168 hours.   DDimer No results for input(s): DDIMER in the last 168 hours.   Radiology    Dg Orthopantogram  Result Date: 10/06/2016 CLINICAL DATA:  Poor dentition EXAM: ORTHOPANTOGRAM/PANORAMIC COMPARISON:  None. FINDINGS: Multiple missing teeth. Caries involving teeth numbers 5, 20, and 27. No definite periapical lucency to suggest abscess. Mandible unremarkable. IMPRESSION: 1. Multiple missing teeth and caries as above. Electronically Signed   By: Corlis Leak M.D.   On: 10/06/2016 13:04    Cardiac Studies   Echo 10/04/16: Study Conclusions  - Left ventricle: The cavity size was normal. There was moderate   concentric hypertrophy. Cavity obliteration in systole. Unable to   exclude LVOT gradient. Systolic function was normal. The   estimated ejection fraction was in the range of 60% to 65%. Wall   motion was normal; there were no  regional wall motion   abnormalities. Doppler parameters are consistent with abnormal   left ventricular relaxation (grade 1 diastolic dysfunction). - Mitral valve: Calcified annulus. - Left atrium: The atrium was normal in size. - Right ventricle: Systolic function was normal. - Pulmonary arteries: Systolic pressure could not be accurately   estimated. - Inferior vena cava: The vessel was small size. . The   respirophasic diameter changes were >= 50%, consistent with low   central venous pressure.  Carotid Doppler 10/04/16: L 50-69%, R 70-99%  Patient Profile     [name] is a 56F with hypertension, hyeprlipidemia and carotid stenosis here with syncope  Assessment & Plan    # Syncope: # High degree AV block: Plan for PPM after dental extractions.  Currently stable.   # Carotid Stenosis:  Neck CT-A pending.  Vascular surgery following and will make recommendations after CT.  # Hypertension:  Blood pressure remains poorly-controlled.  Increase amlodipine to 10mg .  Continue hydralazine and losartan.   # Hyperlipidemia: Rosuvastatin started this admission.  Check lipids and CMP in 6 weeks.   Signed, Chilton Si, MD  10/07/2016, 9:16 AM

## 2016-10-08 ENCOUNTER — Encounter (HOSPITAL_COMMUNITY)
Admission: AD | Disposition: A | Discharge status: Home Health | Payer: Self-pay | Source: Other Acute Inpatient Hospital | Attending: Internal Medicine

## 2016-10-08 ENCOUNTER — Inpatient Hospital Stay (HOSPITAL_COMMUNITY): Payer: Medicare Other

## 2016-10-08 DIAGNOSIS — I119 Hypertensive heart disease without heart failure: Secondary | ICD-10-CM

## 2016-10-08 DIAGNOSIS — K089 Disorder of teeth and supporting structures, unspecified: Secondary | ICD-10-CM

## 2016-10-08 DIAGNOSIS — I441 Atrioventricular block, second degree: Secondary | ICD-10-CM

## 2016-10-08 DIAGNOSIS — Z720 Tobacco use: Secondary | ICD-10-CM

## 2016-10-08 HISTORY — PX: PACEMAKER IMPLANT: EP1218

## 2016-10-08 SURGERY — PACEMAKER IMPLANT

## 2016-10-08 MED ORDER — SODIUM CHLORIDE 0.9 % IR SOLN
Status: AC
  Filled 2016-10-08: qty 2

## 2016-10-08 MED ORDER — ASPIRIN EC 81 MG PO TBEC: 81.0000 mg | DELAYED_RELEASE_TABLET | Freq: Every day | ORAL | Status: DC

## 2016-10-08 MED ORDER — HYDRALAZINE HCL 50 MG PO TABS: 25.0000 mg | ORAL_TABLET | Freq: Three times a day (TID) | ORAL | Status: DC

## 2016-10-08 MED ORDER — CHLORHEXIDINE GLUCONATE 4 % EX LIQD
CUTANEOUS | Status: AC
  Filled 2016-10-08: qty 15

## 2016-10-08 MED ORDER — HEPARIN (PORCINE) IN NACL 2-0.9 UNIT/ML-% IJ SOLN
INTRAMUSCULAR | Status: AC | PRN
  Administered 2016-10-08: 500 mL

## 2016-10-08 MED ORDER — LOSARTAN POTASSIUM 50 MG PO TABS
100.0000 mg | ORAL_TABLET | Freq: Every day | ORAL | Status: DC
  Administered 2016-10-09: 100 mg via ORAL
  Filled 2016-10-08: qty 2

## 2016-10-08 MED ORDER — IOPAMIDOL (ISOVUE-370) INJECTION 76%
INTRAVENOUS | Status: AC
  Administered 2016-10-08: 50 mL via INTRAVENOUS
  Filled 2016-10-08: qty 50

## 2016-10-08 MED ORDER — CEFAZOLIN SODIUM-DEXTROSE 2-4 GM/100ML-% IV SOLN
2.0000 g | INTRAVENOUS | Status: AC
  Administered 2016-10-08: 2 g via INTRAVENOUS

## 2016-10-08 MED ORDER — SODIUM CHLORIDE 0.9 % IR SOLN: 80.0000 mg | Status: DC

## 2016-10-08 MED ORDER — HYDRALAZINE HCL 50 MG PO TABS
50.0000 mg | ORAL_TABLET | Freq: Once | ORAL | Status: AC
  Administered 2016-10-08: 50 mg via ORAL
  Filled 2016-10-08: qty 1

## 2016-10-08 MED ORDER — LIDOCAINE HCL (PF) 1 % IJ SOLN
INTRAMUSCULAR | Status: DC | PRN
  Administered 2016-10-08: 45 mL via INTRADERMAL

## 2016-10-08 MED ORDER — HEPARIN (PORCINE) IN NACL 2-0.9 UNIT/ML-% IJ SOLN
INTRAMUSCULAR | Status: AC
  Filled 2016-10-08: qty 500

## 2016-10-08 MED ORDER — MIDAZOLAM HCL 5 MG/5ML IJ SOLN
INTRAMUSCULAR | Status: AC
  Filled 2016-10-08: qty 5

## 2016-10-08 MED ORDER — MIDAZOLAM HCL 5 MG/5ML IJ SOLN
INTRAMUSCULAR | Status: DC | PRN
  Administered 2016-10-08 (×4): 1 mg via INTRAVENOUS

## 2016-10-08 MED ORDER — CHLORHEXIDINE GLUCONATE 4 % EX LIQD
60.0000 mL | Freq: Once | CUTANEOUS | Status: AC
  Administered 2016-10-08: 4 via TOPICAL

## 2016-10-08 MED ORDER — SODIUM CHLORIDE 0.9 % IV SOLN
INTRAVENOUS | Status: DC
  Administered 2016-10-08: 12:00:00 via INTRAVENOUS

## 2016-10-08 MED ORDER — CEFAZOLIN SODIUM-DEXTROSE 2-4 GM/100ML-% IV SOLN
INTRAVENOUS | Status: AC
  Filled 2016-10-08: qty 100

## 2016-10-08 MED ORDER — HYDRALAZINE HCL 50 MG PO TABS
100.0000 mg | ORAL_TABLET | Freq: Three times a day (TID) | ORAL | Status: DC
  Administered 2016-10-08 – 2016-10-09 (×3): 100 mg via ORAL
  Filled 2016-10-08 (×4): qty 2

## 2016-10-08 MED ORDER — ACETAMINOPHEN 325 MG PO TABS: 325.0000 mg | ORAL_TABLET | ORAL | Status: DC | PRN

## 2016-10-08 MED ORDER — FENTANYL CITRATE (PF) 100 MCG/2ML IJ SOLN
INTRAMUSCULAR | Status: DC | PRN
  Administered 2016-10-08 (×4): 12.5 ug via INTRAVENOUS

## 2016-10-08 MED ORDER — CHLORHEXIDINE GLUCONATE 4 % EX LIQD: 60.0000 mL | Freq: Once | CUTANEOUS | Status: AC

## 2016-10-08 MED ORDER — LIDOCAINE HCL (PF) 1 % IJ SOLN
INTRAMUSCULAR | Status: AC
  Filled 2016-10-08: qty 60

## 2016-10-08 MED ORDER — FENTANYL CITRATE (PF) 100 MCG/2ML IJ SOLN
INTRAMUSCULAR | Status: AC
  Filled 2016-10-08: qty 2

## 2016-10-08 MED ORDER — ONDANSETRON HCL 4 MG/2ML IJ SOLN: 4.0000 mg | Freq: Four times a day (QID) | INTRAMUSCULAR | Status: DC | PRN

## 2016-10-08 MED ORDER — IRBESARTAN 150 MG PO TABS
300.0000 mg | ORAL_TABLET | Freq: Every day | ORAL | Status: DC
  Administered 2016-10-08: 300 mg via ORAL
  Filled 2016-10-08: qty 2

## 2016-10-08 MED ORDER — ROSUVASTATIN CALCIUM 20 MG PO TABS: 20.0000 mg | ORAL_TABLET | Freq: Every day | ORAL | Status: DC

## 2016-10-08 MED ORDER — CEFAZOLIN SODIUM-DEXTROSE 1-4 GM/50ML-% IV SOLN
1.0000 g | Freq: Four times a day (QID) | INTRAVENOUS | Status: AC
  Administered 2016-10-09 (×3): 1 g via INTRAVENOUS
  Filled 2016-10-08 (×6): qty 50

## 2016-10-08 SURGICAL SUPPLY — 12 items
CABLE SURGICAL S-101-97-12 (CABLE) ×3 IMPLANT
CATH RIGHTSITE C315HIS02 (CATHETERS) ×3 IMPLANT
IPG PACE AZUR XT DR MRI W1DR01 (Pacemaker) ×1 IMPLANT
LEAD CAPSURE NOVUS 5076-52CM (Lead) ×3 IMPLANT
LEAD SELECT SECURE 3830 383069 (Lead) ×1 IMPLANT
PACE AZURE XT DR MRI W1DR01 (Pacemaker) ×3 IMPLANT
PAD DEFIB LIFELINK (PAD) ×3 IMPLANT
SELECT SECURE 3830 383069 (Lead) ×3 IMPLANT
SHEATH CLASSIC 7F (SHEATH) ×6 IMPLANT
SLITTER 6232ADJ (MISCELLANEOUS) ×3 IMPLANT
TRAY PACEMAKER INSERTION (PACKS) ×3 IMPLANT
WIRE HI TORQ VERSACORE-J 145CM (WIRE) ×3 IMPLANT

## 2016-10-08 NOTE — Progress Notes (Signed)
Prep. done on the chest with hibiclens,tol.well.

## 2016-10-08 NOTE — Progress Notes (Addendum)
Back from the ep lab by bed. Dressing to left upper chest pacemaker site clean and dry. Bedrest emphasized, left arm sling in placed.

## 2016-10-08 NOTE — Progress Notes (Signed)
Progress Note  Patient Name: Shannon Chen Date of Encounter: 10/08/2016  Primary Cardiologist: Dr. Mariah Chen Primary Electrophysiologist: Dr. Elberta Chen  Subjective   Feeling well.  Hungry.  Otherwise no complaints.    Inpatient Medications    Scheduled Meds: . amLODipine  10 mg Oral Daily  . aspirin EC  81 mg Oral Daily  . hydrALAZINE  50 mg Oral Q6H  . losartan  100 mg Oral Daily  . nicotine  21 mg Transdermal Q24H  . rosuvastatin  40 mg Oral q1800   Continuous Infusions:  PRN Meds: acetaminophen, hydrALAZINE, HYDROcodone-acetaminophen, nitroGLYCERIN, ondansetron (ZOFRAN) IV, zolpidem   Vital Signs    Vitals:   10/08/16 0330 10/08/16 0409 10/08/16 0740 10/08/16 0742  BP: (!) 196/81 (!) 152/65 (!) 159/72   Pulse:   78   Resp: 17  18   Temp: 98 F (36.7 C)   97.8 F (36.6 C)  TempSrc: Oral   Axillary  SpO2: 98%  94%   Weight:      Height:        Intake/Output Summary (Last 24 hours) at 10/08/16 0809 Last data filed at 10/08/16 1117  Gross per 24 hour  Intake              830 ml  Output              950 ml  Net             -120 ml   Filed Weights   10/05/16 1622  Weight: 73.3 kg (161 lb 9.6 oz)    Telemetry    Sinus rhythm.  Mobitz II.  Episodes with up to 2 dropped beats.  - Personally Reviewed  ECG    Sinus rhythm. Rate 91 bpm.  LVH with repolarization abnormalities  - Personally Reviewed  Physical Exam   VS:  BP (!) 159/72 (BP Location: Right Arm)   Pulse 78   Temp 97.8 F (36.6 C) (Axillary)   Resp 18   Ht 5\' 6"  (1.676 m)   Wt 73.3 kg (161 lb 9.6 oz)   SpO2 94%   BMI 26.08 kg/m  , BMI Body mass index is 26.08 kg/m. GENERAL:  Well appearing.  No acute distress HEENT: Pupils equal round and reactive, fundi not visualized, oral mucosa unremarkable.  Poor dentition.  Several missing teeth. NECK:  No jugular venous distention, waveform within normal limits, carotid upstroke brisk and symmetric, no bruits LUNGS:  Clear to auscultation  bilaterally HEART:  RRR.  PMI not displaced or sustained,S1 and S2 within normal limits, no S3, no S4, no clicks, no rubs, II/VI systolic murmur ABD:  Flat, positive bowel sounds normal in frequency in pitch, no bruits, no rebound, no guarding, no midline pulsatile mass, no hepatomegaly, no splenomegaly EXT:  2 plus pulses throughout, no edema, no cyanosis no clubbing SKIN:  No rashes no nodules.  Large lipoma on back  NEURO:  Cranial nerves II through XII grossly intact, motor grossly intact throughout [facility]:  Cognitively intact, oriented to person place and time   Labs    Chemistry  Recent Labs Lab 10/03/16 1422 10/04/16 0637 10/05/16 1516 10/06/16 0442  NA 143 140  --  141  K 3.7 3.5  --  3.8  CL 108 110  --  108  CO2 22 24  --  27  GLUCOSE 183* 112*  --  124*  BUN 28* 20  --  20  CREATININE 1.32* 0.56 0.73 0.81  CALCIUM  9.7 8.5*  --  8.9  PROT 8.1  --   --   --   ALBUMIN 4.1  --   --   --   AST 26  --   --   --   ALT 17  --   --   --   ALKPHOS 74  --   --   --   BILITOT 1.0  --   --   --   GFRNONAA 39* >60 >60 >60  GFRAA 45* >60 >60 >60  ANIONGAP 13 6  --  6     Hematology  Recent Labs Lab 10/03/16 1422 10/04/16 0637 10/05/16 1516  WBC 9.7 7.5 7.2  RBC 5.05 4.57 4.70  HGB 14.9 13.4 13.6  HCT 44.0 39.5 40.5  MCV 87.1 86.5 86.2  MCH 29.5 29.3 28.9  MCHC 33.8 33.9 33.6  RDW 13.2 13.3 13.0  PLT 327 261 280    Cardiac Enzymes  Recent Labs Lab 10/03/16 1422 10/03/16 1841 10/04/16 0033 10/04/16 0637  TROPONINI <0.03 0.05* 0.09* 0.07*   No results for input(s): TROPIPOC in the last 168 hours.   BNPNo results for input(s): BNP, PROBNP in the last 168 hours.   DDimer No results for input(s): DDIMER in the last 168 hours.   Radiology    Dg Orthopantogram  Result Date: 10/06/2016 CLINICAL DATA:  Poor dentition EXAM: ORTHOPANTOGRAM/PANORAMIC COMPARISON:  None. FINDINGS: Multiple missing teeth. Caries involving teeth numbers 5, 20, and 27. No definite  periapical lucency to suggest abscess. Mandible unremarkable. IMPRESSION: 1. Multiple missing teeth and caries as above. Electronically Signed   By: Corlis Leak M.D.   On: 10/06/2016 13:04    Cardiac Studies   Echo 10/04/16: Study Conclusions  - Left ventricle: The cavity size was normal. There was moderate   concentric hypertrophy. Cavity obliteration in systole. Unable to   exclude LVOT gradient. Systolic function was normal. The   estimated ejection fraction was in the range of 60% to 65%. Wall   motion was normal; there were no regional wall motion   abnormalities. Doppler parameters are consistent with abnormal   left ventricular relaxation (grade 1 diastolic dysfunction). - Mitral valve: Calcified annulus. - Left atrium: The atrium was normal in size. - Right ventricle: Systolic function was normal. - Pulmonary arteries: Systolic pressure could not be accurately   estimated. - Inferior vena cava: The vessel was small size. . The   respirophasic diameter changes were >= 50%, consistent with low   central venous pressure.  Carotid Doppler 10/04/16: L 50-69%, R 70-99%  Patient Profile     Shannon Chen is a 83F with hypertension, hyeprlipidemia and carotid stenosis here with syncope  Assessment & Plan    # Syncope: # High degree AV block: Plan for PPM after dental extractions.  Currently stable.   # Carotid Stenosis:  Neck CT-A pending.  Apparently this didn't happen because they were unable to place a large bore IV.  Will ask IV team to return and try again.  Vascular surgery following.  She will likely need R CEA.  They will make recommendations regarding timing after the CT.  Continue aspirin and rosuvastatin.   # Hypertension:  Blood pressure remains poorly-controlled.  Increase hydralazine to 100mg  q8h.  Continue amlodipine and switch losartan to irbesartan 300 mg daily.   # Hyperlipidemia: Rosuvastatin started this admission.  Check lipids and CMP in 6 weeks.    Signed, Chilton Si, MD  10/08/2016, 8:09 AM

## 2016-10-08 NOTE — Progress Notes (Signed)
Electrophysiology Rounding Note  Patient Name: TONGA PROUT Date of Encounter: 10/08/2016  Primary Cardiologist: [name] Electrophysiologist: Elberta Fortis   Subjective   The patient is doing well today.  At this time, the patient denies chest pain, shortness of breath, or any new concerns. She has decided against teeth extraction  Inpatient Medications    Scheduled Meds: . amLODipine  10 mg Oral Daily  . aspirin EC  81 mg Oral Daily  . hydrALAZINE  100 mg Oral Q8H  . irbesartan  300 mg Oral Daily  . nicotine  21 mg Transdermal Q24H  . rosuvastatin  40 mg Oral q1800   Continuous Infusions:  PRN Meds: acetaminophen, hydrALAZINE, HYDROcodone-acetaminophen, nitroGLYCERIN, ondansetron (ZOFRAN) IV, zolpidem   Vital Signs    Vitals:   10/08/16 0409 10/08/16 0740 10/08/16 0742 10/08/16 1145  BP: (!) 152/65 (!) 159/72    Pulse:  78    Resp:  18    Temp:   97.8 F (36.6 C) 97.8 F (36.6 C)  TempSrc:   Axillary Oral  SpO2:  94%    Weight:      Height:        Intake/Output Summary (Last 24 hours) at 10/08/16 1146 Last data filed at 10/08/16 1100  Gross per 24 hour  Intake              560 ml  Output             1100 ml  Net             -540 ml   Filed Weights   10/05/16 1622  Weight: 161 lb 9.6 oz (73.3 kg)    Physical Exam    GEN- The patient is elderly appearing, alert and oriented x 3 today.   Head- normocephalic, atraumatic Eyes-  Sclera clear, conjunctiva pink Ears- hearing intact Neck- supple Lungs- Clear to ausculation bilaterally, normal work of breathing Heart- Regular rate and rhythm  GI- soft, NT, ND, + BS Extremities- no clubbing, cyanosis, or edema Skin- no rash or lesion Psych- euthymic mood, full affect Neuro- strength and sensation are intact  Labs    CBC  Recent Labs  10/05/16 1516  WBC 7.2  HGB 13.6  HCT 40.5  MCV 86.2  PLT 280   Basic Metabolic Panel  Recent Labs  10/05/16 1516 10/06/16 0442  NA  --  141  K  --  3.8    CL  --  108  CO2  --  27  GLUCOSE  --  124*  BUN  --  20  CREATININE 0.73 0.81  CALCIUM  --  8.9     Telemetry    Sinus rhythm with intermittent complete heart block  (personally reviewed)  Radiology    Dg Orthopantogram  Result Date: 10/06/2016 CLINICAL DATA:  Poor dentition EXAM: ORTHOPANTOGRAM/PANORAMIC COMPARISON:  None. FINDINGS: Multiple missing teeth. Caries involving teeth numbers 5, 20, and 27. No definite periapical lucency to suggest abscess. Mandible unremarkable. IMPRESSION: 1. Multiple missing teeth and caries as above. Electronically Signed   By: Corlis Leak M.D.   On: 10/06/2016 13:04     Patient Profile     Ms. Salberg is a 38F with hypertension, hyeprlipidemia and carotid stenosis here with syncope  Assessment & Plan    1.  Intermittent complete heart block Persistent She has decided against teeth extraction, we discussed extensively infection risks. She would like to go ahead and proceed with pacemaker. Risks, benefits reviewed. Will plan for  later today  Signed, Gypsy Balsam, NP  10/08/2016, 11:46 AM   EP Attending  Patient seen and examined. Agree with above. The patient has declined tooth extraction and wishes to proceed with PPM insertion. She understands that her infection risk is increased. Once the PPM is in place, I have strongly encouraged her to see her dentist as an outpatient.  I have discussed the indications, risks/benefits/goals/expectations of PPM insertion and she wishes to proceed.   Leonia Reeves.D.

## 2016-10-08 NOTE — Progress Notes (Signed)
Transported down for CT scan of the neck with SWOT RN by wheelchair.

## 2016-10-08 NOTE — Progress Notes (Signed)
10/08/2016  Patient:            Shannon Chen Date of Birth:  May 22, 1945 MRN:                [id number]   BP (!) 159/72 (BP Location: Right Arm)   Pulse 78   Temp 97.8 F (36.6 C) (Axillary)   Resp 18   Ht 5\' 6"  (1.676 m)   Wt 161 lb 9.6 oz (73.3 kg)   SpO2 94%   BMI 26.08 kg/m   [name]  Is a 71 year old female recently diagnosed with an AV block. Patient with anticipated placement of a permanent pacemaker by Dr. Elberta Fortis.  A dental consultation was then requested to evaluate poor dentition prior to placement of the permanent pacemaker.  The patient was examined and treatment planned for extraction of remaining teeth with alveoloplasty in the operating room with general anesthesia.  The procedure had been scheduled for Friday, 10/10/2016 at 7:30 AM.   I presented today to again discuss risks, benefits, complications of the proposed treatment.  After further discussion of risks, benefits and potential complications, the patient indicates that she does not wish to proceed with dental procedures in the operating room at this time. I reviewed the potential risk for the teeth to be a cause of infection to the anticipated pacemaker placement, infection to the heart , and infection to the heart valves. The patient expresses understanding of these potential complications and again refuses to proceed with dental extractions at this time. I then contacted Dr. Elberta Fortis concerning the patient's decision. Dr. Elberta Fortis will again discuss potential risk for infection if the teeth are not extracted.  If the patient does not change her mind, I will cancel the operating room procedure currently scheduled for Friday morning.   Charlynne Pander, DDS [phone] Clinic [phone]  Pager

## 2016-10-08 NOTE — Progress Notes (Signed)
Transported to the cath lab by bed. 

## 2016-10-09 ENCOUNTER — Encounter (HOSPITAL_COMMUNITY): Payer: Self-pay | Admitting: Internal Medicine

## 2016-10-09 ENCOUNTER — Inpatient Hospital Stay (HOSPITAL_COMMUNITY): Payer: Medicare Other

## 2016-10-09 DIAGNOSIS — E785 Hyperlipidemia, unspecified: Secondary | ICD-10-CM | POA: Diagnosis present

## 2016-10-09 MED ORDER — ACETAMINOPHEN 325 MG PO TABS: 650.0000 mg | ORAL_TABLET | ORAL | Status: DC | PRN

## 2016-10-09 MED ORDER — CARVEDILOL 6.25 MG PO TABS: 6.2500 mg | ORAL_TABLET | Freq: Two times a day (BID) | ORAL | 5 refills | Status: DC

## 2016-10-09 MED ORDER — CARVEDILOL 6.25 MG PO TABS
6.2500 mg | ORAL_TABLET | Freq: Two times a day (BID) | ORAL | Status: DC
  Administered 2016-10-09: 6.25 mg via ORAL
  Filled 2016-10-09: qty 1

## 2016-10-09 MED ORDER — CARVEDILOL 12.5 MG PO TABS: 12.5000 mg | ORAL_TABLET | Freq: Two times a day (BID) | ORAL | Status: DC

## 2016-10-09 MED ORDER — NICOTINE 21 MG/24HR TD PT24: 21.0000 mg | MEDICATED_PATCH | TRANSDERMAL | 0 refills | Status: DC

## 2016-10-09 MED ORDER — HYDRALAZINE HCL 25 MG PO TABS: 50.0000 mg | ORAL_TABLET | Freq: Three times a day (TID) | ORAL | 5 refills | Status: DC

## 2016-10-09 MED ORDER — ROSUVASTATIN CALCIUM 40 MG PO TABS: 40.0000 mg | ORAL_TABLET | Freq: Every day | ORAL | 5 refills | Status: DC
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
## 2016-10-09 NOTE — Care Management Note (Addendum)
Case Management Note  Patient Details  Name: Shannon Chen MRN: [id number] Date of Birth: August 02, 1945  Subjective/Objective:   Pt is s/p pacemaker placement               Action/Plan:   PTA independent from home - pt still drives.  Pt is in agreement for [facility] as suggested by cardiology - pt given choice of agencies that accepts insurance and pt chose [facility].  Pt will attempt to arrange with family and friends for transportation from [place] to [place] for follow up appts during the time periods when pt is advised not to drive.  [facility] contacted and referral accepted for [facility] - agency informed of discharge today.  Pt will transport via private vehicle home..    Expected Discharge Date:  10/09/16               Expected Discharge Plan:  Home w Home Health Services  In-House Referral:     Discharge planning Services  CM Consult  Post Acute Care Choice:    Choice offered to:  Patient  DME Arranged:    DME Agency:     HH Arranged:  RN HH Agency:  [facility]  Status of Service:  In process, will continue to follow  If discussed at Long Length of Stay Meetings, dates discussed:    Additional Comments: CM explained to pt the barriers with transportation assistance across counties -  Pt states she has arranged for nephew to provide transportation to appts.  Also, Cardiology PA will also arrange for pts first follow up appt to be in [place]  CM contacted council on aging, [employer] , [facility] to gain transportation assistance - however neither agency provides transportation across counties.  CM consulted CSW for transportation assistance including [place].   Cherylann Parr, RN 10/09/2016, 11:28 AM

## 2016-10-09 NOTE — Discharge Instructions (Signed)
° ° °  Supplemental Discharge Instructions for  Pacemaker/Defibrillator Patients  Activity No heavy lifting or vigorous activity with your left/right arm for 6 to 8 weeks.  Do not raise your left/right arm above your head for one week.  Gradually raise your affected arm as drawn below.          10/13/16                              10/14/16                        10/15/16                    10/16/16 __  NO DRIVING until after wound check appointment   WOUND CARE - Keep the wound area clean and dry.  Do not get this area wet, no showers for 1 week - The tape/steri-strips on your wound will fall off; do not pull them off.  No bandage is needed on the site.  DO  NOT apply any creams, oils, or ointments to the wound area. - If you notice any drainage or discharge from the wound, any swelling or bruising at the site, or you develop a fever > 101? F after you are discharged home, call the office at once.  Special Instructions - You are still able to use cellular telephones; use the ear opposite the side where you have your pacemaker/defibrillator.  Avoid carrying your cellular phone near your device. - When traveling through airports, show security personnel your identification card to avoid being screened in the metal detectors.  Ask the security personnel to use the hand wand. - Avoid arc welding equipment, MRI testing (magnetic resonance imaging), TENS units (transcutaneous nerve stimulators).  Call the office for questions about other devices. - Avoid electrical appliances that are in poor condition or are not properly grounded. - Microwave ovens are safe to be near or to operate.  Additional information for defibrillator patients should your device go off: - If your device goes off ONCE and you feel fine afterward, notify the device clinic nurses. - If your device goes off ONCE and you do not feel well afterward, call 911. - If your device goes off TWICE, call 911. - If your device goes off  THREE times in one day, call 911.  DO NOT DRIVE YOURSELF OR A FAMILY MEMBER WITH A DEFIBRILLATOR TO THE HOSPITAL--CALL 911.

## 2016-10-09 NOTE — Progress Notes (Signed)
Resting throughout the night. C/o pain at pacer site but refusing any analgesic. Sling to left arm usually underneath patient, finally took it off. Knows not to turn on affected side. Up to void in [facility].

## 2016-10-09 NOTE — Progress Notes (Signed)
Discharge note. Patient educated at bedside on medications and when to take them, follow-up appointments, when to call the MD, when to call 911, instructions on pacemaker. Peripheral IVs removed without complications.   Patient discharged in wheelchair by NT with godson.
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
## 2016-10-10 SURGERY — MULTIPLE EXTRACTION WITH ALVEOLOPLASTY
Anesthesia: General

## 2016-10-14 ENCOUNTER — Ambulatory Visit: Payer: Medicare Other

## 2016-10-15 ENCOUNTER — Ambulatory Visit: Payer: Medicare Other

## 2016-10-21 ENCOUNTER — Ambulatory Visit (INDEPENDENT_AMBULATORY_CARE_PROVIDER_SITE_OTHER): Payer: Medicare Other | Admitting: *Deleted

## 2016-10-21 DIAGNOSIS — I441 Atrioventricular block, second degree: Secondary | ICD-10-CM

## 2016-10-21 DIAGNOSIS — R55 Syncope and collapse: Secondary | ICD-10-CM

## 2016-10-21 DIAGNOSIS — Z950 Presence of cardiac pacemaker: Secondary | ICD-10-CM

## 2016-10-21 LAB — CUP PACEART INCLINIC DEVICE CHECK
Brady Statistic AP VP Percent: 2.64 %
Brady Statistic AP VS Percent: 1.91 %
Brady Statistic AS VP Percent: 0.18 %
Brady Statistic AS VS Percent: 95.27 %
Date Time Interrogation Session: 20180904121624
Implantable Lead Location: 753859
Implantable Lead Model: 5076
Lead Channel Impedance Value: 494 Ohm
Lead Channel Pacing Threshold Amplitude: 0.5 V
Lead Channel Sensing Intrinsic Amplitude: 27.5 mV
Lead Channel Setting Pacing Amplitude: 3.5 V
Lead Channel Setting Pacing Pulse Width: 1 ms
MDC IDC LEAD IMPLANT DT: 20180822
MDC IDC LEAD IMPLANT DT: 20180822
MDC IDC LEAD LOCATION: 753860
MDC IDC MSMT BATTERY REMAINING LONGEVITY: 182 mo
MDC IDC MSMT BATTERY VOLTAGE: 3.23 V
MDC IDC MSMT LEADCHNL RA IMPEDANCE VALUE: 418 Ohm
MDC IDC MSMT LEADCHNL RA IMPEDANCE VALUE: 532 Ohm
MDC IDC MSMT LEADCHNL RA PACING THRESHOLD AMPLITUDE: 0.5 V
MDC IDC MSMT LEADCHNL RA PACING THRESHOLD PULSEWIDTH: 0.4 ms
MDC IDC MSMT LEADCHNL RA SENSING INTR AMPL: 4.625 mV
MDC IDC MSMT LEADCHNL RV IMPEDANCE VALUE: 399 Ohm
MDC IDC MSMT LEADCHNL RV PACING THRESHOLD PULSEWIDTH: 1 ms
MDC IDC PG IMPLANT DT: 20180822
MDC IDC SET LEADCHNL RV PACING AMPLITUDE: 3.5 V
MDC IDC SET LEADCHNL RV SENSING SENSITIVITY: 2 mV
MDC IDC STAT BRADY RA PERCENT PACED: 4.58 %
MDC IDC STAT BRADY RV PERCENT PACED: 2.82 %

## 2016-10-21 NOTE — Progress Notes (Signed)
Wound check appointment. Steri-strips removed. Wound without redness or edema. Incision edges approximated, wound well healed. Normal device function. Thresholds, sensing, and impedances consistent with implant measurements. EKG reviewed, RV (His) capture appears septal. Device programmed at 3.5V for extra safety margin until 3 month visit. Histogram distribution appropriate for patient and level of activity. No mode switches or high ventricular rates noted. Patient educated about wound care, arm mobility, lifting restrictions. ROV with GT in 3 months.

## 2016-10-23 ENCOUNTER — Ambulatory Visit: Payer: Medicare Other

## 2016-10-24 ENCOUNTER — Other Ambulatory Visit: Payer: Self-pay

## 2016-10-29 ENCOUNTER — Ambulatory Visit (INDEPENDENT_AMBULATORY_CARE_PROVIDER_SITE_OTHER): Payer: Medicare Other | Admitting: Nurse Practitioner

## 2016-10-29 ENCOUNTER — Encounter: Payer: Self-pay | Admitting: Nurse Practitioner

## 2016-10-29 VITALS — BP 146/90 | HR 91 | Ht 66.0 in | Wt 151.8 lb

## 2016-10-29 DIAGNOSIS — I441 Atrioventricular block, second degree: Secondary | ICD-10-CM | POA: Diagnosis not present

## 2016-10-29 DIAGNOSIS — E782 Mixed hyperlipidemia: Secondary | ICD-10-CM

## 2016-10-29 DIAGNOSIS — I779 Disorder of arteries and arterioles, unspecified: Secondary | ICD-10-CM

## 2016-10-29 DIAGNOSIS — R55 Syncope and collapse: Secondary | ICD-10-CM

## 2016-10-29 DIAGNOSIS — I739 Peripheral vascular disease, unspecified: Secondary | ICD-10-CM

## 2016-10-29 DIAGNOSIS — I10 Essential (primary) hypertension: Secondary | ICD-10-CM | POA: Diagnosis not present

## 2016-10-29 MED ORDER — CARVEDILOL 12.5 MG PO TABS: 12.5000 mg | ORAL_TABLET | Freq: Two times a day (BID) | ORAL | 3 refills | Status: DC

## 2016-10-29 NOTE — Patient Instructions (Signed)
Medication Instructions:  Your physician has recommended you make the following change in your medication:  INCREASE coreg to 12.5mg  twice daily   Labwork: [facility]. Nothing to eat or drink after midnight the evening before your labs.   Testing/Procedures: none  Follow-Up: Your physician recommends that you schedule a follow-up appointment with Dr. Mariah MillingGollan at the end of October.    Any Other Special Instructions Will Be Listed Below (If Applicable).     If you need a refill on your cardiac medications before your next appointment, please call your pharmacy.
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
## 2016-11-18 ENCOUNTER — Encounter: Payer: Self-pay | Admitting: Vascular Surgery

## 2016-11-18 ENCOUNTER — Ambulatory Visit (INDEPENDENT_AMBULATORY_CARE_PROVIDER_SITE_OTHER): Payer: Medicare Other | Admitting: Vascular Surgery

## 2016-11-18 VITALS — BP 120/63 | HR 72 | Temp 97.4°F | Resp 16 | Ht 65.0 in | Wt 152.4 lb

## 2016-11-18 DIAGNOSIS — I6523 Occlusion and stenosis of bilateral carotid arteries: Secondary | ICD-10-CM
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
## 2016-11-19 NOTE — Addendum Note (Signed)
Addended by: Ladana Chavero A on: 11/19/2016 09:46 AM   Modules accepted: Orders  

## 2016-12-15 ENCOUNTER — Other Ambulatory Visit: Payer: Self-pay | Admitting: Cardiology

## 2016-12-15 NOTE — Telephone Encounter (Signed)
REFILL 
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
## 2016-12-17 ENCOUNTER — Encounter: Payer: Self-pay | Admitting: Cardiovascular Disease

## 2016-12-17 ENCOUNTER — Ambulatory Visit (INDEPENDENT_AMBULATORY_CARE_PROVIDER_SITE_OTHER): Payer: Medicare Other | Admitting: Cardiovascular Disease

## 2016-12-17 VITALS — BP 150/88 | HR 76 | Ht 65.5 in | Wt 146.5 lb

## 2016-12-17 DIAGNOSIS — E785 Hyperlipidemia, unspecified: Secondary | ICD-10-CM | POA: Diagnosis not present

## 2016-12-17 DIAGNOSIS — I441 Atrioventricular block, second degree: Secondary | ICD-10-CM

## 2016-12-17 DIAGNOSIS — I6523 Occlusion and stenosis of bilateral carotid arteries: Secondary | ICD-10-CM

## 2016-12-17 DIAGNOSIS — I10 Essential (primary) hypertension: Secondary | ICD-10-CM

## 2016-12-17 DIAGNOSIS — Z720 Tobacco use: Secondary | ICD-10-CM

## 2016-12-17 NOTE — Patient Instructions (Addendum)
Medication Instructions:   No medication changes made  Labwork:  No new labs needed  Testing/Procedures:  We will schedule a stress test (lexiscan) for arrhythmia and abn EKG, Unable to treadmill  [facility] MYOVIEW  Your caregiver has ordered a Stress Test with nuclear imaging. The purpose of this test is to evaluate the blood supply to your heart muscle. This procedure is referred to as a "Non-Invasive Stress Test." This is because other than having an IV started in your vein, nothing is inserted or "invades" your body. Cardiac stress tests are done to find areas of poor blood flow to the heart by determining the extent of coronary artery disease (CAD). Some patients exercise on a treadmill, which naturally increases the blood flow to your heart, while others who are  unable to walk on a treadmill due to physical limitations have a pharmacologic/chemical stress agent called Lexiscan . This medicine will mimic walking on a treadmill by temporarily increasing your coronary blood flow.   Please note: these test may take anywhere between 2-4 hours to complete  PLEASE REPORT TO [facility]TR[facility]  THE VOLUNTEERS AT THE FIRST DESK WILL DIRECT YOU WHERE TO GO  Date of Procedure:_Thursday Nov. 8th___  Arrival Time for Procedure:___08:45AM____  Instructions regarding medication:   _X___:  Hold Carvedilol the night before procedure and morning of procedure    PLEASE NOTIFY THE OFFICE AT LEAST 24 HOURS IN ADVANCE IF YOU ARE UNABLE TO KEEP YOUR APPOINTMENT.  [phone] AND  PLEASE NOTIFY NUCLEAR MEDICINE AT [facility] AT LEAST 24 HOURS IN ADVANCE IF YOU ARE UNABLE TO KEEP YOUR APPOINTMENT. [phone]  How to prepare for your Myoview test:  1. Do not eat or drink after midnight 2. No caffeine for 24 hours prior to test 3. No smoking 24 hours prior to test. 4. Your medication may be taken with water.  If your doctor stopped a medication because of this test, do not take that  medication. 5. Ladies, please do not wear dresses.  Skirts or pants are appropriate. Please wear a short sleeve shirt. 6. No perfume, cologne or lotion. 7. Wear comfortable walking shoes. No heels!   Follow-Up: It was a pleasure seeing you in the office today. Please call us if you have new issues that need to be addressed before your next appt.  [phone]  Your physician wants you to follow-up in: 6 months.  You will receive a reminder letter in the mail two months in advance. If you don't receive a letter, please call our office to schedule the follow-up appointment.  If you need a refill on your cardiac medications before your next appointment, please call your pharmacy.

## 2016-12-25 ENCOUNTER — Ambulatory Visit
Admission: RE | Admit: 2016-12-25 | Discharge: 2016-12-25 | Disposition: A | Discharge status: Home / Self Care | Payer: Medicare Other | Source: Ambulatory Visit | Attending: Cardiovascular Disease | Admitting: Cardiovascular Disease

## 2016-12-25 DIAGNOSIS — I441 Atrioventricular block, second degree: Secondary | ICD-10-CM | POA: Insufficient documentation

## 2016-12-25 LAB — NM MYOCAR MULTI W/SPECT W/WALL MOTION / EF
CHL CUP NUCLEAR SDS: 0
CHL CUP NUCLEAR SRS: 2
CHL CUP NUCLEAR SSS: 0
CHL CUP RESTING HR STRESS: 63 {beats}/min
LV dias vol: 54 mL (ref 46–106)
LV sys vol: 22 mL
NUC STRESS TID: 1.21
Peak HR: 81 {beats}/min
Percent HR: 55 %

## 2016-12-25 MED ORDER — TECHNETIUM TC 99M TETROFOSMIN IV KIT
10.0000 | PACK | Freq: Once | INTRAVENOUS | Status: AC | PRN
  Administered 2016-12-25: 8.86 via INTRAVENOUS

## 2016-12-25 MED ORDER — TECHNETIUM TC 99M TETROFOSMIN IV KIT
27.6900 | PACK | Freq: Once | INTRAVENOUS | Status: AC | PRN
  Administered 2016-12-25: 27.69 via INTRAVENOUS

## 2016-12-25 MED ORDER — REGADENOSON 0.4 MG/5ML IV SOLN
0.4000 mg | Freq: Once | INTRAVENOUS | Status: AC
  Administered 2016-12-25: 0.4 mg via INTRAVENOUS

## 2017-01-01 ENCOUNTER — Telehealth: Payer: Self-pay | Admitting: Cardiovascular Disease

## 2017-01-01 NOTE — Telephone Encounter (Signed)
No answer/No voicemail box has been set up. 

## 2017-01-01 NOTE — Telephone Encounter (Signed)
Reviewed stress test results with patient and she verbalized understanding with no further questions.

## 2017-01-01 NOTE — Telephone Encounter (Signed)
Pt returning our call °Please call back ° °

## 2017-01-06 ENCOUNTER — Ambulatory Visit: Payer: Medicare Other | Admitting: Cardiology

## 2017-01-12 ENCOUNTER — Encounter: Payer: Medicare Other | Admitting: Internal Medicine

## 2017-01-13 ENCOUNTER — Encounter: Payer: Self-pay | Admitting: Internal Medicine

## 2017-01-13 ENCOUNTER — Ambulatory Visit (INDEPENDENT_AMBULATORY_CARE_PROVIDER_SITE_OTHER): Payer: Medicare Other | Admitting: Internal Medicine

## 2017-01-13 VITALS — BP 160/62 | HR 78 | Ht 65.5 in | Wt 147.5 lb

## 2017-01-13 DIAGNOSIS — R55 Syncope and collapse: Secondary | ICD-10-CM | POA: Diagnosis not present

## 2017-01-13 DIAGNOSIS — I441 Atrioventricular block, second degree: Secondary | ICD-10-CM

## 2017-01-13 MED ORDER — CARVEDILOL 12.5 MG PO TABS: 18.7500 mg | ORAL_TABLET | Freq: Two times a day (BID) | ORAL | 3 refills | Status: DC

## 2017-01-13 NOTE — Progress Notes (Signed)
Patient Care Team: Gustavo LahFralix, Teresa, MD as PCP - General (Family Medicine)   HPI  Shannon Chen is a 71 y.o. female Seen in followup for Medtronic Para Hisian PM implanted 8/18 for symptomatic second-degree heart block.  No interval syncope  No chest pain edema or sob  BP   LVEF 8/18 normal LV function Myoview 11/18 no ischemia  Records and Results Reviewed  Date Cr K Hgb  8/18  0.81 3.8 13.6           Past Medical History:  Diagnosis Date  . Arthritis   . AV block, Mobitz II    a. 09/2016 s/p MDT [id number] Azure XT DR MRI DC PPM  . Carotid artery disease ([name])    a. 09/2016 Carotid U/S: RICA 70-99%, LICA 50-69%;  b. 09/2016 CTA Neck: RICA 60, RCCA 30, LICA 70%, L Vert 60, L Basilar 70.  . Essential hypertension   . Hyperlipidemia   . Systolic murmur    09/2016 Echo: EF 60-65%, no rwma, Gr1 DD, Ca2+ MV annulus.    Past Surgical History:  Procedure Laterality Date  . ABDOMINAL HYSTERECTOMY    . PACEMAKER IMPLANT N/A 10/08/2016   Procedure: Pacemaker Implant;  Surgeon: Marinus Mawaylor, Gregg W, MD;  [facility];  Service: Cardiovascular;  Laterality: N/A;    Current Outpatient Medications  Medication Sig Dispense Refill  . aspirin EC 81 MG tablet Take 1 tablet (81 mg total) by mouth daily. 150 tablet 2  . carvedilol (COREG) 12.5 MG tablet Take 1 tablet (12.5 mg total) by mouth 2 (two) times daily. 180 tablet 3  . hydrALAZINE (APRESOLINE) 25 MG tablet Take 2 tablets (50 mg total) by mouth 3 (three) times daily. 180 tablet 5  . losartan (COZAAR) 100 MG tablet TAKE 1 TABLET BY MOUTH EVERY DAY 30 tablet 6  . nicotine (NICODERM CQ - DOSED IN MG/24 HOURS) 21 mg/24hr patch Place 1 patch (21 mg total) onto the skin daily. 28 patch 0  . rosuvastatin (CRESTOR) 40 MG tablet Take 1 tablet (40 mg total) by mouth daily at 6 PM. (Patient taking differently: Take 60 mg by mouth daily at 6 PM. ) 30 tablet 5   No current facility-administered medications for this visit.     What we have her increase  No Known Allergies    Review of Systems negative except from HPI and PMH  Physical Exam BP (!) 160/62 (BP Location: Left Arm, Patient Position: Sitting, Cuff Size: Normal)   Pulse 78   Ht 5' 5.5" (1.664 m)   Wt 147 lb 8 oz (66.9 kg)   BMI 24.17 kg/m  Well developed and well nourished in no acute distress HENT normal E scleral and icterus clear Neck Supple JVP flat; carotids brisk and full Clear to ausculation Regular rate and rhythm, no murmurs gallops or rub Soft with active bowel sounds No clubbing cyanosis none Edema Alert and oriented, grossly normal motor and sensory function Skin Warm and Dry  Personally reviewed  ECG 9/18  Low atrial rhythm v Junctional  11/26/35  Assessment and  Plan  Mobitz 2 AVB  Syncope  Medtronic ParaHisian Pacemaker  Hypertension    BP is elevated  And was previously   Easiest option is to increase carvedilol 12.5>>18.75   Will have her followup with PCP in a few weeks  No interval syncope  Device function normal        Current medicines are reviewed at length with  the patient today .  The patient does not  have concerns regarding medicines.

## 2017-01-13 NOTE — Patient Instructions (Addendum)
Medication Instructions:  Your physician has recommended you make the following change in your medication:  INCREASE coreg to 18.75mg  (1 and 1/2 tablets) twice daily   Labwork: none  Testing/Procedures: none  Follow-Up: Your physician wants you to follow-up in: 9 months with Dr. Graciela HusbandsKlein. You will receive a reminder letter in the mail two months in advance. If you don't receive a letter, please call our office to schedule the follow-up appointment.   Any Other Special Instructions Will Be Listed Below (If Applicable). Please follow up with your primary care physician in two weeks for a blood pressure check.      If you need a refill on your cardiac medications before your next appointment, please call your pharmacy.

## 2017-01-20 ENCOUNTER — Other Ambulatory Visit: Payer: Self-pay

## 2017-01-29 ENCOUNTER — Ambulatory Visit: Payer: Self-pay

## 2017-02-03 LAB — CUP PACEART INCLINIC DEVICE CHECK
Brady Statistic AP VP Percent: 3.35 %
Brady Statistic AP VS Percent: 1.39 %
Brady Statistic AS VP Percent: 2.35 %
Brady Statistic AS VS Percent: 92.92 %
Brady Statistic RV Percent Paced: 5.7 %
Date Time Interrogation Session: 20181127162040
Implantable Lead Implant Date: 20180822
Implantable Lead Model: 5076
Implantable Pulse Generator Implant Date: 20180822
Lead Channel Impedance Value: 342 Ohm
Lead Channel Impedance Value: 437 Ohm
Lead Channel Impedance Value: 513 Ohm
Lead Channel Pacing Threshold Amplitude: 1.125 V
Lead Channel Pacing Threshold Pulse Width: 0.4 ms
Lead Channel Sensing Intrinsic Amplitude: 21.375 mV
Lead Channel Setting Pacing Amplitude: 2.5 V
Lead Channel Setting Pacing Pulse Width: 0.4 ms
Lead Channel Setting Sensing Sensitivity: 2 mV
MDC IDC LEAD IMPLANT DT: 20180822
MDC IDC LEAD LOCATION: 753859
MDC IDC LEAD LOCATION: 753860
MDC IDC MSMT BATTERY REMAINING LONGEVITY: 178 mo
MDC IDC MSMT BATTERY VOLTAGE: 3.2 V
MDC IDC MSMT LEADCHNL RA IMPEDANCE VALUE: 570 Ohm
MDC IDC MSMT LEADCHNL RA PACING THRESHOLD AMPLITUDE: 0.5 V
MDC IDC MSMT LEADCHNL RA SENSING INTR AMPL: 6.375 mV
MDC IDC MSMT LEADCHNL RV PACING THRESHOLD PULSEWIDTH: 0.4 ms
MDC IDC SET LEADCHNL RV PACING AMPLITUDE: 2.5 V
MDC IDC STAT BRADY RA PERCENT PACED: 4.75 %

## 2017-04-14 ENCOUNTER — Encounter: Payer: Medicare Other | Admitting: *Deleted

## 2017-04-14 ENCOUNTER — Telehealth: Payer: Self-pay | Admitting: Cardiology

## 2017-04-14 NOTE — Telephone Encounter (Signed)
LMOVM reminding pt to send remote transmission.   

## 2017-04-15 ENCOUNTER — Encounter: Payer: Self-pay | Admitting: Cardiology

## 2017-05-19 ENCOUNTER — Ambulatory Visit: Payer: Medicare Other | Admitting: Vascular Surgery

## 2017-05-19 ENCOUNTER — Encounter (HOSPITAL_COMMUNITY): Payer: Self-pay

## 2017-06-01 ENCOUNTER — Other Ambulatory Visit: Payer: Self-pay | Admitting: Cardiology

## 2017-08-11 ENCOUNTER — Encounter: Payer: Self-pay | Admitting: Cardiology

## 2017-09-11 ENCOUNTER — Encounter: Payer: Self-pay | Admitting: Cardiology

## 2017-11-17 ENCOUNTER — Inpatient Hospital Stay
Admission: EM | Admit: 2017-11-17 | Discharge: 2017-11-20 | DRG: 297 | Disposition: A | Discharge status: Home / Self Care | Payer: Medicare Other | Attending: Internal Medicine | Admitting: Internal Medicine

## 2017-11-17 ENCOUNTER — Emergency Department: Payer: Medicare Other

## 2017-11-17 ENCOUNTER — Inpatient Hospital Stay: Payer: Medicare Other

## 2017-11-17 ENCOUNTER — Inpatient Hospital Stay (HOSPITAL_COMMUNITY)
Admit: 2017-11-17 | Discharge: 2017-11-17 | Disposition: A | Discharge status: Home / Self Care | Payer: Medicare Other | Attending: Internal Medicine | Admitting: Internal Medicine

## 2017-11-17 ENCOUNTER — Other Ambulatory Visit: Payer: Self-pay

## 2017-11-17 DIAGNOSIS — E860 Dehydration: Secondary | ICD-10-CM | POA: Diagnosis present

## 2017-11-17 DIAGNOSIS — I441 Atrioventricular block, second degree: Secondary | ICD-10-CM | POA: Diagnosis present

## 2017-11-17 DIAGNOSIS — I959 Hypotension, unspecified: Secondary | ICD-10-CM | POA: Diagnosis present

## 2017-11-17 DIAGNOSIS — Z79899 Other long term (current) drug therapy: Secondary | ICD-10-CM | POA: Diagnosis not present

## 2017-11-17 DIAGNOSIS — Z87891 Personal history of nicotine dependence: Secondary | ICD-10-CM

## 2017-11-17 DIAGNOSIS — I469 Cardiac arrest, cause unspecified: Secondary | ICD-10-CM

## 2017-11-17 DIAGNOSIS — E785 Hyperlipidemia, unspecified: Secondary | ICD-10-CM | POA: Diagnosis present

## 2017-11-17 DIAGNOSIS — I350 Nonrheumatic aortic (valve) stenosis: Secondary | ICD-10-CM | POA: Diagnosis present

## 2017-11-17 DIAGNOSIS — R7989 Other specified abnormal findings of blood chemistry: Secondary | ICD-10-CM | POA: Diagnosis present

## 2017-11-17 DIAGNOSIS — N179 Acute kidney failure, unspecified: Secondary | ICD-10-CM | POA: Diagnosis present

## 2017-11-17 DIAGNOSIS — I248 Other forms of acute ischemic heart disease: Secondary | ICD-10-CM | POA: Diagnosis present

## 2017-11-17 DIAGNOSIS — Z8673 Personal history of transient ischemic attack (TIA), and cerebral infarction without residual deficits: Secondary | ICD-10-CM | POA: Diagnosis not present

## 2017-11-17 DIAGNOSIS — I5030 Unspecified diastolic (congestive) heart failure: Secondary | ICD-10-CM | POA: Diagnosis not present

## 2017-11-17 DIAGNOSIS — E876 Hypokalemia: Secondary | ICD-10-CM | POA: Diagnosis present

## 2017-11-17 DIAGNOSIS — I421 Obstructive hypertrophic cardiomyopathy: Secondary | ICD-10-CM | POA: Diagnosis not present

## 2017-11-17 DIAGNOSIS — I472 Ventricular tachycardia: Secondary | ICD-10-CM | POA: Diagnosis present

## 2017-11-17 DIAGNOSIS — I10 Essential (primary) hypertension: Secondary | ICD-10-CM | POA: Diagnosis present

## 2017-11-17 DIAGNOSIS — I422 Other hypertrophic cardiomyopathy: Secondary | ICD-10-CM | POA: Diagnosis present

## 2017-11-17 DIAGNOSIS — Z950 Presence of cardiac pacemaker: Secondary | ICD-10-CM

## 2017-11-17 DIAGNOSIS — R55 Syncope and collapse: Secondary | ICD-10-CM | POA: Diagnosis present

## 2017-11-17 LAB — LACTIC ACID, PLASMA
Lactic Acid, Venous: 2.8 mmol/L (ref 0.5–1.9)
Lactic Acid, Venous: 1.2 mmol/L (ref 0.5–1.9)

## 2017-11-17 LAB — CBC WITH DIFFERENTIAL/PLATELET
Basophils Absolute: 0 10*3/uL (ref 0–0.1)
Basophils Relative: 0 %
EOS ABS: 0 10*3/uL (ref 0–0.7)
EOS PCT: 0 %
HCT: 37.3 % (ref 35.0–47.0)
HEMOGLOBIN: 13.1 g/dL (ref 12.0–16.0)
LYMPHS ABS: 4.6 10*3/uL — AB (ref 1.0–3.6)
Lymphocytes Relative: 48 %
MCH: 30.6 pg (ref 26.0–34.0)
MCHC: 35 g/dL (ref 32.0–36.0)
MCV: 87.4 fL (ref 80.0–100.0)
MONOS PCT: 6 %
Monocytes Absolute: 0.6 10*3/uL (ref 0.2–0.9)
NEUTROS PCT: 46 %
Neutro Abs: 4.4 10*3/uL (ref 1.4–6.5)
Platelets: 296 10*3/uL (ref 150–440)
RBC: 4.27 MIL/uL (ref 3.80–5.20)
RDW: 13.4 % (ref 11.5–14.5)
WBC: 9.7 10*3/uL (ref 3.6–11.0)

## 2017-11-17 LAB — COMPREHENSIVE METABOLIC PANEL
ALT: 11 U/L (ref 0–44)
AST: 15 U/L (ref 15–41)
Albumin: 3.7 g/dL (ref 3.5–5.0)
Alkaline Phosphatase: 60 U/L (ref 38–126)
Anion gap: 13 (ref 5–15)
BUN: 32 mg/dL — AB (ref 8–23)
CHLORIDE: 105 mmol/L (ref 98–111)
CO2: 23 mmol/L (ref 22–32)
CREATININE: 1.39 mg/dL — AB (ref 0.44–1.00)
Calcium: 8.6 mg/dL — ABNORMAL LOW (ref 8.9–10.3)
GFR calc Af Amer: 42 mL/min — ABNORMAL LOW (ref >60)
GFR calc non Af Amer: 36 mL/min — ABNORMAL LOW (ref >60)
Glucose, Bld: 196 mg/dL — ABNORMAL HIGH (ref 70–99)
Potassium: 2.8 mmol/L — ABNORMAL LOW (ref 3.5–5.1)
SODIUM: 141 mmol/L (ref 135–145)
Total Bilirubin: 1.2 mg/dL (ref 0.3–1.2)
Total Protein: 7 g/dL (ref 6.5–8.1)

## 2017-11-17 LAB — MAGNESIUM: Magnesium: 2.1 mg/dL (ref 1.7–2.4)

## 2017-11-17 LAB — TROPONIN I
TROPONIN I: 0.03 ng/mL — AB (ref <0.03)
Troponin I: 0.06 ng/mL (ref <0.03)

## 2017-11-17 MED ORDER — SODIUM CHLORIDE 0.9 % IV SOLN
INTRAVENOUS | Status: DC | PRN
  Administered 2017-11-17: 500 mL via INTRAVENOUS

## 2017-11-17 MED ORDER — LOSARTAN POTASSIUM 50 MG PO TABS
100.0000 mg | ORAL_TABLET | Freq: Every day | ORAL | Status: DC
  Administered 2017-11-17: 100 mg via ORAL

## 2017-11-17 MED ORDER — POTASSIUM CHLORIDE CRYS ER 20 MEQ PO TBCR
40.0000 meq | EXTENDED_RELEASE_TABLET | ORAL | Status: AC
  Administered 2017-11-17: 40 meq via ORAL
  Filled 2017-11-17: qty 2

## 2017-11-17 MED ORDER — ENOXAPARIN SODIUM 40 MG/0.4ML ~~LOC~~ SOLN
40.0000 mg | SUBCUTANEOUS | Status: DC
  Administered 2017-11-17 – 2017-11-19 (×3): 40 mg via SUBCUTANEOUS
  Filled 2017-11-17 (×3): qty 0.4

## 2017-11-17 MED ORDER — ONDANSETRON HCL 4 MG/2ML IJ SOLN: 4.0000 mg | Freq: Four times a day (QID) | INTRAMUSCULAR | Status: DC | PRN

## 2017-11-17 MED ORDER — ACETAMINOPHEN 650 MG RE SUPP: 650.0000 mg | Freq: Four times a day (QID) | RECTAL | Status: DC | PRN

## 2017-11-17 MED ORDER — SODIUM CHLORIDE 0.9% FLUSH
3.0000 mL | Freq: Two times a day (BID) | INTRAVENOUS | Status: DC
  Administered 2017-11-17 – 2017-11-20 (×6): 3 mL via INTRAVENOUS

## 2017-11-17 MED ORDER — CARVEDILOL 6.25 MG PO TABS
18.7500 mg | ORAL_TABLET | Freq: Two times a day (BID) | ORAL | Status: DC
  Administered 2017-11-17: 18.75 mg via ORAL
  Filled 2017-11-17: qty 1

## 2017-11-17 MED ORDER — ALBUTEROL SULFATE (2.5 MG/3ML) 0.083% IN NEBU: 2.5000 mg | INHALATION_SOLUTION | RESPIRATORY_TRACT | Status: DC | PRN

## 2017-11-17 MED ORDER — ONDANSETRON HCL 4 MG PO TABS: 4.0000 mg | ORAL_TABLET | Freq: Four times a day (QID) | ORAL | Status: DC | PRN

## 2017-11-17 MED ORDER — POLYETHYLENE GLYCOL 3350 17 G PO PACK: 17.0000 g | PACK | Freq: Every day | ORAL | Status: DC | PRN

## 2017-11-17 MED ORDER — ASPIRIN EC 81 MG PO TBEC
81.0000 mg | DELAYED_RELEASE_TABLET | Freq: Every day | ORAL | Status: DC
  Administered 2017-11-18 – 2017-11-20 (×3): 81 mg via ORAL
  Filled 2017-11-17 (×3): qty 1

## 2017-11-17 MED ORDER — SODIUM CHLORIDE 0.9 % IV BOLUS
1000.0000 mL | Freq: Once | INTRAVENOUS | Status: AC
  Administered 2017-11-17: 1000 mL via INTRAVENOUS

## 2017-11-17 MED ORDER — ROSUVASTATIN CALCIUM 10 MG PO TABS
40.0000 mg | ORAL_TABLET | Freq: Every day | ORAL | Status: DC
  Administered 2017-11-18 – 2017-11-19 (×2): 40 mg via ORAL
  Filled 2017-11-17 (×2): qty 4

## 2017-11-17 MED ORDER — HYDRALAZINE HCL 20 MG/ML IJ SOLN: 10.0000 mg | Freq: Four times a day (QID) | INTRAMUSCULAR | Status: DC | PRN

## 2017-11-17 MED ORDER — POTASSIUM CHLORIDE 10 MEQ/100ML IV SOLN
10.0000 meq | INTRAVENOUS | Status: AC
  Administered 2017-11-17: 10 meq via INTRAVENOUS
  Filled 2017-11-17 (×2): qty 100

## 2017-11-17 MED ORDER — ACETAMINOPHEN 325 MG PO TABS: 650.0000 mg | ORAL_TABLET | Freq: Four times a day (QID) | ORAL | Status: DC | PRN

## 2017-11-17 NOTE — ED Notes (Signed)
Floor nurse Merdis Delay given report.
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
## 2017-11-17 NOTE — Consult Note (Signed)
Cardiology Consultation:   Patient ID: Shannon Chen MRN: [id number]; DOB: 23-Oct-1945  Admit date: 11/17/2017 Date of Consult: 11/17/2017  Primary Care Provider: Gustavo Lah, MD Primary Cardiologist: Dossie Arbour, MD PhD Primary Electrophysiologist: Berton Mount, MD   Patient Profile:   Shannon Chen is a 72 y.o. 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 Elpidio Anis.  History of Present Illness:   Shannon Chen was in her usual state of health and had returned around noon from taking her sister to the wound care clinic.  The patient was seated outside in her carport and was suddenly noted to slump over by her nephew.  He rushed out to her and found Shannon Chen unresponsive and without a pulse.  He was able to lie her on the ground and begin CPR, which lasted for a total of 15 minutes per his report.  EMS was summoned and found the patient to be responsive with low blood pressure (SBP in the 80s).  She reportedly complained of chest and abdominal pain at that time, though she now denies this.  Since arriving in the emergency department, the patient reports that she has felt well.  She has not had any bradycardia or hypotension here.  At this time, she feels back to her baseline.  Shannon Chen notes that she has not been eating and drinking very well for several weeks, predominantly due to anxiety and stress related to her sister.  This morning, she had only consumed half a glass of water.  She had not had anything else to eat or drink.  She denies any recent medication changes or missing doses/taking extra medications.  She has not felt any palpitations, lightheadedness, orthopnea, PND, or edema.  Past Medical History:  Diagnosis Date  . Arthritis   . AV block, Mobitz II    a. 09/2016 s/p MDT  [id number] Azure XT DR MRI DC PPM  . Carotid artery disease ([name])    a. 09/2016 Carotid U/S: RICA 70-99%, LICA 50-69%;  b. 09/2016 CTA Neck: RICA 60, RCCA 30, LICA 70%, L Vert 60, L Basilar 70.  . Essential hypertension   . Hyperlipidemia   . Systolic murmur    09/2016 Echo: EF 60-65%, no rwma, Gr1 DD, Ca2+ MV annulus.    Past Surgical History:  Procedure Laterality Date  . ABDOMINAL HYSTERECTOMY    . PACEMAKER IMPLANT N/A 10/08/2016   Procedure: Pacemaker Implant;  Surgeon: Marinus Maw, MD;  Location: [facility] INVASIVE CV LAB;  Service: Cardiovascular;  Laterality: N/A;     Home Medications:  Prior to Admission medications   Medication Sig Start Date Angelia Hazell Date Taking? Authorizing Provider  carvedilol (COREG) 12.5 MG tablet Take 1.5 tablets (18.75 mg total) by mouth 2 (two) times daily. Patient not taking: Reported on 11/17/2017 01/13/17 11/17/17  Duke Salvia, MD  hydrALAZINE (APRESOLINE) 25 MG tablet Take 2 tablets (50 mg total) by mouth 3 (three) times daily. Patient not taking: Reported on 11/17/2017 06/03/17   Abelino Derrick, PA-C  losartan (COZAAR) 100 MG tablet TAKE 1 TABLET BY MOUTH EVERY DAY Patient not taking: Reported on 11/17/2017 12/15/16   Abelino Derrick, PA-C  rosuvastatin (CRESTOR) 40 MG tablet Take 1 tablet (40 mg total) by mouth daily at 6 PM. Patient not taking: Reported on 11/17/2017 10/09/16   Diona Fanti,  Eda Paschal, PA-C    Inpatient Medications: Scheduled Meds: . [START ON 11/18/2017] aspirin EC  81 mg Oral Daily  . enoxaparin (LOVENOX) injection  40 mg Subcutaneous Q24H  . potassium chloride  40 mEq Oral Q4H  . sodium chloride flush  3 mL Intravenous Q12H   Continuous Infusions: . potassium chloride 10 mEq (11/17/17 1748)   PRN Meds: acetaminophen **OR** acetaminophen, albuterol, hydrALAZINE, ondansetron **OR** ondansetron (ZOFRAN) IV, polyethylene glycol  Allergies:   No Known Allergies  Social History:   Social History   Tobacco Use  . Smoking status: Former Smoker      Packs/day: 1.00    Years: 30.00    Pack years: 30.00    Types: Cigarettes  . Smokeless tobacco: Never Used  Substance Use Topics  . Alcohol use: No  . Drug use: No     Family History:   Family History  Problem Relation Age of Onset  . Diabetes Mother   . CAD Father      ROS:  Please see the history of present illness. All other ROS reviewed and negative.     Physical Exam/Data:   Vitals:   11/17/17 1645 11/17/17 1700 11/17/17 1745 11/17/17 1800  BP: (!) 145/66 (!) 155/70 (!) 156/90 (!) 158/74  Pulse:  75 76 82  Resp: 17 (!) 21 18 16   Temp:      TempSrc:      SpO2:  96% 100% 97%  Weight:      Height:        Intake/Output Summary (Last 24 hours) at 11/17/2017 1830 Last data filed at 11/17/2017 1656 Gross per 24 hour  Intake 1000 ml  Output -  Net 1000 ml   Filed Weights   11/17/17 1457  Weight: 68 kg   Body mass index is 24.95 kg/m.  General: Elderly woman, lying comfortably in bed.  She is accompanied by her nephew. HEENT: Moist mucous membranes.  Poor dentition. Lymph: no adenopathy Neck: no JVD or HJR Endocrine:  No thryomegaly Vascular: 1+ radial pulses bilaterally. Cardiac: Regular rate and rhythm with 1/6 systolic murmur.  No rubs or gallops. Lungs:  Clear to auscultation bilaterally, no wheezing, rhonchi or rales  Abd: Soft, nontender, no hepatomegaly  Ext: No lower extremity edema. Musculoskeletal:  No deformities, BUE and BLE strength normal and equal Skin: warm and dry  Neuro:  CNs 2-12 intact, no focal abnormalities noted Psych:  Normal affect   EKG:  The EKG was personally reviewed and demonstrates: Normal sinus rhythm with right axis deviation, right bundle branch block, and prolonged QTC.  Relevant CV Studies: TTE (10/04/17): - Left ventricle: The cavity size was normal. There was moderate   concentric hypertrophy. Cavity obliteration in systole. Unable to   exclude LVOT gradient. Systolic function was normal. The   estimated ejection  fraction was in the range of 60% to 65%. Wall   motion was normal; there were no regional wall motion   abnormalities. Doppler parameters are consistent with abnormal   left ventricular relaxation (grade 1 diastolic dysfunction). - Mitral valve: Calcified annulus. - Left atrium: The atrium was normal in size. - Right ventricle: Systolic function was normal. - Pulmonary arteries: Systolic pressure could not be accurately   estimated. - Inferior vena cava: The vessel was small size. . The   respirophasic diameter changes were >= 50%, consistent with low   central venous pressure.  Device interrogation (personally performed; 11/17/2017): Mode: DDD, lower rate 60 bpm, upper tract/sensor rate 120  bpm.  Capture threshold 0.5 V (atrial) and 1.0 V (RV).  Sensitivity 5.4 mV (P) 7.6 mV (R) several brief episodes of nonsustained ventricular tachycardia noted since January, most recently on 11/09/2017.  No episodes recorded today to explain syncope.   Laboratory Data:  Chemistry Recent Labs  Lab 11/17/17 1445  NA 141  K 2.8*  CL 105  CO2 23  GLUCOSE 196*  BUN 32*  CREATININE 1.39*  CALCIUM 8.6*  GFRNONAA 36*  GFRAA 42*  ANIONGAP 13    Recent Labs  Lab 11/17/17 1445  PROT 7.0  ALBUMIN 3.7  AST 15  ALT 11  ALKPHOS 60  BILITOT 1.2   Hematology Recent Labs  Lab 11/17/17 1445  WBC 9.7  RBC 4.27  HGB 13.1  HCT 37.3  MCV 87.4  MCH 30.6  MCHC 35.0  RDW 13.4  PLT 296   Cardiac Enzymes Recent Labs  Lab 11/17/17 1445  TROPONINI 0.03*   No results for input(s): TROPIPOC in the last 168 hours.  BNPNo results for input(s): BNP, PROBNP in the last 168 hours.  DDimer No results for input(s): DDIMER in the last 168 hours.  Radiology/Studies:  Ct Abdomen Pelvis Wo Contrast  Result Date: 11/17/2017 CLINICAL DATA:  Patient presents ill-appearing after episode of syncope at home during which she was suspected to have a cardiac arrest. She had CPR at home and did have return of  spontaneous circulation but hypotensive on initial assessment. EKG does not show a STEMI. Plan to check labs. With epigastric tenderness I will get a CT scan of the abdomen to evaluate for AAA. IV fluid bolus for volume resuscitation. EXAM: CT ABDOMEN AND PELVIS WITHOUT CONTRAST TECHNIQUE: Multidetector CT imaging of the abdomen and pelvis was performed following the standard protocol without IV contrast. COMPARISON:  12/20/2004 FINDINGS: Lower chest: No acute abnormality. Hepatobiliary: Liver normal in size attenuation. Gallbladder is distended. No gallstones. No wall thickening or pericholecystic inflammation no bile duct dilation. Pancreas: Unremarkable. No pancreatic ductal dilatation or surrounding inflammatory changes. Spleen: Normal in size without focal abnormality. Adrenals/Urinary Tract: No adrenal masses. Kidneys normal size, orientation and position. No renal masses. Are renal vascular calcifications. No collecting system stones. No hydronephrosis. Ureters normal in course and in caliber.  No ureteral stones. Bladder is unremarkable. Stomach/Bowel: Stomach and small bowel unremarkable colon is normal in caliber. No colonic wall thickening or adjacent inflammation. Scattered colonic diverticula. Normal appendix visualized. Vascular/Lymphatic: There is aortic ectasia and diffuse atherosclerotic calcifications. Aorta is dilated to 3.2 cm at the aortic hiatus. The infrarenal abdominal aorta is dilated to 2.4 cm, anterior posterior, by 2.5 cm transversely. Aortic ectasia has increased compared to the prior CT. No adenopathy. Reproductive: Status post hysterectomy. No adnexal masses. Other: No abdominal wall hernia or abnormality. No abdominopelvic ascites. Musculoskeletal: No fracture or acute finding. No osteoblastic or osteolytic lesions. IMPRESSION: 1. No acute findings. 2. Aortic ectasia. Maximum aortic transverse dimension is 2.4 x 2.5 cm. No aneurysm rupture. Ectatic abdominal aorta at risk for aneurysm  development. Recommend followup by ultrasound in 5 years. This recommendation follows ACR consensus guidelines: White Paper of the ACR Incidental Findings Committee II on Vascular Findings. J Am Coll Radiol 2013; 10:789-794. Electronically Signed   By: Amie Portland M.D.   On: 11/17/2017 15:44   Dg Chest Port 1 View  Result Date: 11/17/2017 CLINICAL DATA:  Syncope, hypotension, cysts underwent CPR, history coronary artery disease, hypertension, arrhythmia EXAM: PORTABLE CHEST 1 VIEW COMPARISON:  Portable exam 1722 hours compared  10/09/2016 FINDINGS: LEFT subclavian sequential pacemaker leads project over RIGHT atrium and questionably near tricuspid valve versus proximal RIGHT ventricle, unchanged. Minimal enlargement of cardiac silhouette. Atherosclerotic calcification aorta. Mediastinal contours and pulmonary vascularity normal. Lungs clear. No infiltrate, pleural effusion or pneumothorax. Bones demineralized. IMPRESSION: Enlargement of cardiac silhouette post pacemaker. No acute abnormalities. Electronically Signed   By: Ulyses Southward M.D.   On: 11/17/2017 17:38    Assessment and Plan:   Syncope Unclear etiology, though I am most suspicious for hypotension secondary to dehydration.  Significant cerebrovascular disease may have complicated this, though it does not explain why the patient was found to be pulseless.  She does not have any residual neurologic findings.  She has not had any chest pain either.  EKG shows a new atypical appearing right bundle branch block.  Initial troponin of 0.03 is nonspecific.  Pacemaker interrogation today demonstrates normal device function and no arrhythmia to explain today's episode.  Repeat EKG.  Trend troponin x3 or until it has peaked.  Obtain transthoracic echocardiogram.  Monitor overnight on telemetry.  Check orthostatic vital signs.  Defer ischemia evaluation unless patient has angina, dynamic EKG changes, or significant rise in troponin.  Evaluation  for potential occult infection, such as UTI, per IM service.  Consider work-up for PE; d-dimer may be helpful.  Hypertension Blood pressure suboptimally controlled.  Continue home medications.  If patient is orthostatic, adjustments may need to be made.   For questions or updates, please contact [facility] Please consult [email or web address] for contact info under [facility] cardiology.  Signed, Yvonne Kendall, MD  11/17/2017 6:30 PM

## 2017-11-17 NOTE — H&P (Signed)
[employer] - [place] [facility]   PATIENT NAME: Shannon Chen    MR#:  [id number]  DATE OF BIRTH:  12-Jul-1945  DATE OF ADMISSION:  11/17/2017  PRIMARY CARE PHYSICIAN: Gustavo Lah, MD   REQUESTING/REFERRING PHYSICIAN: Dr. Pershing Proud  CHIEF COMPLAINT:   Chief Complaint  Patient presents with  . Loss of Consciousness    HISTORY OF PRESENT ILLNESS:  Shannon Chen  is a 72 y.o. female with a known history of Mobitz type II status post pacemaker, hypertension, carotid artery disease presents to the emergency room brought in by EMS after patient had an episode of syncope.  Patient had helped her sister go to a doctor's office.  Was tired and was sitting in the porch.  At the last thing she remembers and passed out.  Nephew found her on the floor and checked her pulse which was absent and no respirations and started CPR.  Patient had CPR for approximately 5 to 7 minutes until EMS arrived and was found to be hypotensive with systolic blood pressure in the 80s.  Patient slowly woke up.  Complained of some chest and abdominal pain which has resolved at this time.  Brought to the emergency room and has had stable vitals.  Troponin normal.  Lactic acid mildly elevated at 2.5.  Patient has no pain at this time.  EKG shows no ST-T wave changes.  PAST MEDICAL HISTORY:   Past Medical History:  Diagnosis Date  . Arthritis   . AV block, Mobitz II    a. 09/2016 s/p MDT [id number] Azure XT DR MRI DC PPM  . Carotid artery disease ([name])    a. 09/2016 Carotid U/S: RICA 70-99%, LICA 50-69%;  b. 09/2016 CTA Neck: RICA 60, RCCA 30, LICA 70%, L Vert 60, L Basilar 70.  . Essential hypertension   . Hyperlipidemia   . Systolic murmur    09/2016 Echo: EF 60-65%, no rwma, Gr1 DD, Ca2+ MV annulus.    PAST SURGICAL HISTORY:   Past Surgical History:  Procedure Laterality Date  . ABDOMINAL HYSTERECTOMY    . PACEMAKER IMPLANT N/A 10/08/2016   Procedure: Pacemaker Implant;  Surgeon: Marinus Maw,  MD;  Location: [facility] INVASIVE CV LAB;  Service: Cardiovascular;  Laterality: N/A;    SOCIAL HISTORY:   Social History   Tobacco Use  . Smoking status: Former Smoker    Packs/day: 1.00    Years: 30.00    Pack years: 30.00    Types: Cigarettes  . Smokeless tobacco: Never Used  Substance Use Topics  . Alcohol use: No    FAMILY HISTORY:   Family History  Problem Relation Age of Onset  . Diabetes Mother   . CAD Father     DRUG ALLERGIES:  No Known Allergies  REVIEW OF SYSTEMS:   Review of Systems  Constitutional: Positive for malaise/fatigue. Negative for chills and fever.  HENT: Negative for sore throat.   Eyes: Negative for blurred vision, double vision and pain.  Respiratory: Negative for cough, hemoptysis, shortness of breath and wheezing.   Cardiovascular: Negative for chest pain, palpitations, orthopnea and leg swelling.  Gastrointestinal: Negative for abdominal pain, constipation, diarrhea, heartburn, nausea and vomiting.  Genitourinary: Negative for dysuria and hematuria.  Musculoskeletal: Negative for back pain and joint pain.  Skin: Negative for rash.  Neurological: Positive for loss of consciousness. Negative for sensory change, speech change, focal weakness and headaches.  Endo/Heme/Allergies: Does not bruise/bleed easily.  Psychiatric/Behavioral: Negative for depression. The patient is not  nervous/anxious.     MEDICATIONS AT HOME:   Prior to Admission medications   Medication Sig Start Date End Date Taking? Authorizing Provider  carvedilol (COREG) 12.5 MG tablet Take 1.5 tablets (18.75 mg total) by mouth 2 (two) times daily. 01/13/17 04/13/17  Duke Salvia, MD  hydrALAZINE (APRESOLINE) 25 MG tablet Take 2 tablets (50 mg total) by mouth 3 (three) times daily. 06/03/17   Abelino Derrick, PA-C  losartan (COZAAR) 100 MG tablet TAKE 1 TABLET BY MOUTH EVERY DAY 12/15/16   Kilroy, Eda Paschal, PA-C  nicotine (NICODERM CQ - DOSED IN MG/24 HOURS) 21 mg/24hr patch Place 1  patch (21 mg total) onto the skin daily. 10/09/16   Abelino Derrick, PA-C  rosuvastatin (CRESTOR) 40 MG tablet Take 1 tablet (40 mg total) by mouth daily at 6 PM. Patient taking differently: Take 60 mg by mouth daily at 6 PM.  10/09/16   Kilroy, Eda Paschal, PA-C     VITAL SIGNS:  Blood pressure (!) 145/66, pulse 80, temperature (!) 96.7 F (35.9 C), temperature source Axillary, resp. rate 17, height 5\' 5"  (1.651 m), weight 68 kg, SpO2 100 %.  PHYSICAL EXAMINATION:  Physical Exam  GENERAL:  72 y.o.-year-old patient lying in the bed with no acute distress.  EYES: Pupils equal, round, reactive to light and accommodation. No scleral icterus. Extraocular muscles intact.  HEENT: Head atraumatic, normocephalic. Oropharynx and nasopharynx clear. No oropharyngeal erythema, moist oral mucosa  NECK:  Supple, no jugular venous distention. No thyroid enlargement, no tenderness.  LUNGS: Normal breath sounds bilaterally, no wheezing, rales, rhonchi. No use of accessory muscles of respiration.  CARDIOVASCULAR: S1, S2 normal. No murmurs, rubs, or gallops.  ABDOMEN: Soft, nontender, nondistended. Bowel sounds present. No organomegaly or mass.  EXTREMITIES: No pedal edema, cyanosis, or clubbing. + 2 pedal & radial pulses b/l.   NEUROLOGIC: Cranial nerves II through XII are intact. No focal Motor or sensory deficits appreciated b/l PSYCHIATRIC: The patient is alert and oriented x 3. Good affect.  SKIN: No obvious rash, lesion, or ulcer.   LABORATORY PANEL:   CBC Recent Labs  Lab 11/17/17 1445  WBC 9.7  HGB 13.1  HCT 37.3  PLT 296   ------------------------------------------------------------------------------------------------------------------  Chemistries  Recent Labs  Lab 11/17/17 1445  NA 141  K 2.8*  CL 105  CO2 23  GLUCOSE 196*  BUN 32*  CREATININE 1.39*  CALCIUM 8.6*  AST 15  ALT 11  ALKPHOS 60  BILITOT 1.2    ------------------------------------------------------------------------------------------------------------------  Cardiac Enzymes Recent Labs  Lab 11/17/17 1445  TROPONINI 0.03*   ------------------------------------------------------------------------------------------------------------------  RADIOLOGY:  Ct Abdomen Pelvis Wo Contrast  Result Date: 11/17/2017 CLINICAL DATA:  Patient presents ill-appearing after episode of syncope at home during which she was suspected to have a cardiac arrest. She had CPR at home and did have return of spontaneous circulation but hypotensive on initial assessment. EKG does not show a STEMI. Plan to check labs. With epigastric tenderness I will get a CT scan of the abdomen to evaluate for AAA. IV fluid bolus for volume resuscitation. EXAM: CT ABDOMEN AND PELVIS WITHOUT CONTRAST TECHNIQUE: Multidetector CT imaging of the abdomen and pelvis was performed following the standard protocol without IV contrast. COMPARISON:  12/20/2004 FINDINGS: Lower chest: No acute abnormality. Hepatobiliary: Liver normal in size attenuation. Gallbladder is distended. No gallstones. No wall thickening or pericholecystic inflammation no bile duct dilation. Pancreas: Unremarkable. No pancreatic ductal dilatation or surrounding inflammatory changes. Spleen: Normal in size without focal  abnormality. Adrenals/Urinary Tract: No adrenal masses. Kidneys normal size, orientation and position. No renal masses. Are renal vascular calcifications. No collecting system stones. No hydronephrosis. Ureters normal in course and in caliber.  No ureteral stones. Bladder is unremarkable. Stomach/Bowel: Stomach and small bowel unremarkable colon is normal in caliber. No colonic wall thickening or adjacent inflammation. Scattered colonic diverticula. Normal appendix visualized. Vascular/Lymphatic: There is aortic ectasia and diffuse atherosclerotic calcifications. Aorta is dilated to 3.2 cm at the aortic  hiatus. The infrarenal abdominal aorta is dilated to 2.4 cm, anterior posterior, by 2.5 cm transversely. Aortic ectasia has increased compared to the prior CT. No adenopathy. Reproductive: Status post hysterectomy. No adnexal masses. Other: No abdominal wall hernia or abnormality. No abdominopelvic ascites. Musculoskeletal: No fracture or acute finding. No osteoblastic or osteolytic lesions. IMPRESSION: 1. No acute findings. 2. Aortic ectasia. Maximum aortic transverse dimension is 2.4 x 2.5 cm. No aneurysm rupture. Ectatic abdominal aorta at risk for aneurysm development. Recommend followup by ultrasound in 5 years. This recommendation follows ACR consensus guidelines: White Paper of the ACR Incidental Findings Committee II on Vascular Findings. J Am Coll Radiol 2013; 10:789-794. Electronically Signed   By: Amie Portland M.D.   On: 11/17/2017 15:44     IMPRESSION AND PLAN:   *Syncope versus cardiac arrest. Patient is status post CPR at home.  Will admit to telemetry floor.  Check echocardiogram.  Check chest x-ray repeat troponin.  We will have her [employer] pacemaker interrogated here in the emergency room.  Discussed with charge nurse. Case discussed with cardiology Dr. Okey Dupre who will see the patient.   *Hypokalemia  replace potassium .  Check magnesium levels  *Hypertension.  Continue home medications.  IV hydralazine as needed added.  *DVT prophylaxis with Lovenox  All the records are reviewed and case discussed with ED provider. Management plans discussed with the patient, family and they are in agreement.  CODE STATUS: Full code  TOTAL TIME TAKING CARE OF THIS PATIENT: 40 minutes.   Molinda Bailiff Lashaya Kienitz M.D on 11/17/2017 at 4:59 PM  Between 7am to 6pm - Pager - [phone]  After 6pm go to [email or web address] - password [email or web address]  [facility]  Office  [phone]  CC: Primary care physician; Gustavo Lah, MD  Note: This dictation was prepared with Dragon dictation  along with smaller phrase technology. Any transcriptional errors that result from this process are unintentional.
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
## 2017-11-17 NOTE — Progress Notes (Signed)
Advance care planning  Purpose of Encounter Cardiac arrest.  Parties in Attendance Patient  Patients Decisional capacity Patient is alert and oriented.  Discussed with patient regarding her need for admission and further work-up for possible cardiac arrest.  Patient questions answered. Patient is a full code.  Presented per  Time spent - 17 minutes

## 2017-11-17 NOTE — ED Provider Notes (Signed)
Signout from Dr. Scotty Court in this 72 year old female with CPR in progress prior to arrival, now with baseline level of consciousness.  Pending labs as well as CT angiography of the abdomen at this time.  Plan is to admit to the hospital for postarrest observation.  Physical Exam  BP (!) 153/42   Pulse 80   Temp (!) 96.7 F (35.9 C) (Axillary)   Resp 17   Ht 5\' 5"  (1.651 m)   Wt 68 kg   SpO2 97%   BMI 24.95 kg/m  ----------------------------------------- 4:39 PM on 11/17/2017 -----------------------------------------   Physical Exam Patient at this time resting without any distress. ED Course/Procedures     Procedures  MDM  CT angiography with an ectatic aorta but without any other critical findings on the CT angiography.  Patient to be admitted to the hospital.  Patient with acute renal insufficiency as well as an elevated lactic acid.  White blood cell count.  Likely elevated lactic acid secondary to hypoperfusing episode.  Patient to be admitted to the hospital.  Signed out to Dr. Elpidio Anis.       Myrna Blazer, MD 11/17/17 1640

## 2017-11-18 DIAGNOSIS — I422 Other hypertrophic cardiomyopathy: Secondary | ICD-10-CM

## 2017-11-18 LAB — BASIC METABOLIC PANEL
Anion gap: 7 (ref 5–15)
BUN: 23 mg/dL (ref 8–23)
CHLORIDE: 109 mmol/L (ref 98–111)
CO2: 26 mmol/L (ref 22–32)
CREATININE: 0.68 mg/dL (ref 0.44–1.00)
Calcium: 8.1 mg/dL — ABNORMAL LOW (ref 8.9–10.3)
GFR calc Af Amer: >60 mL/min (ref >60)
GFR calc non Af Amer: >60 mL/min (ref >60)
GLUCOSE: 110 mg/dL — AB (ref 70–99)
Potassium: 3.4 mmol/L — ABNORMAL LOW (ref 3.5–5.1)
SODIUM: 142 mmol/L (ref 135–145)

## 2017-11-18 LAB — CBC
HCT: 35.3 % (ref 35.0–47.0)
Hemoglobin: 12.2 g/dL (ref 12.0–16.0)
MCH: 30.2 pg (ref 26.0–34.0)
MCHC: 34.5 g/dL (ref 32.0–36.0)
MCV: 87.4 fL (ref 80.0–100.0)
PLATELETS: 258 10*3/uL (ref 150–440)
RBC: 4.04 MIL/uL (ref 3.80–5.20)
RDW: 13.2 % (ref 11.5–14.5)
WBC: 6.6 10*3/uL (ref 3.6–11.0)

## 2017-11-18 LAB — ECHOCARDIOGRAM COMPLETE
Height: 65 in
Weight: 2398.6 oz

## 2017-11-18 MED ORDER — POTASSIUM CHLORIDE CRYS ER 20 MEQ PO TBCR
40.0000 meq | EXTENDED_RELEASE_TABLET | Freq: Once | ORAL | Status: AC
  Administered 2017-11-18: 40 meq via ORAL
  Filled 2017-11-18: qty 2

## 2017-11-18 MED ORDER — CARVEDILOL 25 MG PO TABS
25.0000 mg | ORAL_TABLET | Freq: Two times a day (BID) | ORAL | Status: DC
  Administered 2017-11-18 – 2017-11-20 (×5): 25 mg via ORAL
  Filled 2017-11-18: qty 1
  Filled 2017-11-18: qty 2
  Filled 2017-11-18 (×3): qty 1

## 2017-11-18 MED ORDER — DILTIAZEM HCL 30 MG PO TABS
30.0000 mg | ORAL_TABLET | Freq: Four times a day (QID) | ORAL | Status: DC
  Administered 2017-11-18 – 2017-11-19 (×5): 30 mg via ORAL
  Filled 2017-11-18 (×5): qty 1

## 2017-11-18 NOTE — Progress Notes (Signed)
[facility] - [place] at [facility]   PATIENT NAME: Shannon Chen    MR#:  [id number]  DATE OF BIRTH:  [birth year]  SUBJECTIVE: Admitted for syncope, LOC.  Family members with CPR.  Patient now is alert, denies any chest pain.  CHIEF COMPLAINT:   Chief Complaint  Patient presents with  . Loss of Consciousness    REVIEW OF SYSTEMS:    Review of Systems  Constitutional: Negative for chills and fever.  HENT: Negative for hearing loss.   Eyes: Negative for blurred vision, double vision and photophobia.  Respiratory: Negative for cough, hemoptysis and shortness of breath.   Cardiovascular: Negative for palpitations, orthopnea and leg swelling.  Gastrointestinal: Negative for abdominal pain, diarrhea and vomiting.  Genitourinary: Negative for dysuria and urgency.  Musculoskeletal: Negative for myalgias and neck pain.  Skin: Negative for rash.  Neurological: Negative for dizziness, focal weakness, seizures, weakness and headaches.  Psychiatric/Behavioral: Negative for memory loss. The patient does not have insomnia.     Nutrition: Tolerating Diet: Tolerating PT:      DRUG ALLERGIES:  No Known Allergies  VITALS:  Blood pressure (!) 137/52, pulse 70, temperature 97.9 F (36.6 C), resp. rate 18, height 5\' 5"  (1.651 m), weight 66.2 kg, SpO2 99 %.  PHYSICAL EXAMINATION:   Physical Exam  GENERAL:  72 y.o.-year-old patient lying in the bed with no acute distress.  EYES: Pupils equal, round, reactive to light and accommodation. No scleral icterus. Extraocular muscles intact.  HEENT: Head atraumatic, normocephalic. Oropharynx and nasopharynx clear.  NECK:  Supple, no jugular venous distention. No thyroid enlargement, no tenderness.  LUNGS: Normal breath sounds bilaterally, no wheezing, rales,rhonchi or crepitation. No use of accessory muscles of respiration.  CARDIOVASCULAR: S1, S2 normal. No murmurs, rubs, or gallops.  ABDOMEN: Soft, nontender,  nondistended. Bowel sounds present. No organomegaly or mass.  EXTREMITIES: No pedal edema, cyanosis, or clubbing.  NEUROLOGIC: Cranial nerves II through XII are intact. Muscle strength 5/5 in all extremities. Sensation intact. Gait not checked.  PSYCHIATRIC: The patient is alert and oriented x 3.  SKIN: No obvious rash, lesion, or ulcer.    LABORATORY PANEL:   CBC Recent Labs  Lab 11/18/17 0533  WBC 6.6  HGB 12.2  HCT 35.3  PLT 258   ------------------------------------------------------------------------------------------------------------------  Chemistries  Recent Labs  Lab 11/17/17 1445 11/18/17 0533  NA 141 142  K 2.8* 3.4*  CL 105 109  CO2 23 26  GLUCOSE 196* 110*  BUN 32* 23  CREATININE 1.39* 0.68  CALCIUM 8.6* 8.1*  MG 2.1  --   AST 15  --   ALT 11  --   ALKPHOS 60  --   BILITOT 1.2  --    ------------------------------------------------------------------------------------------------------------------  Cardiac Enzymes Recent Labs  Lab 11/18/17 0005  TROPONINI 0.09*   ------------------------------------------------------------------------------------------------------------------  RADIOLOGY:  Ct Abdomen Pelvis Wo Contrast  Result Date: 11/17/2017 CLINICAL DATA:  Patient presents ill-appearing after episode of syncope at home during which she was suspected to have a cardiac arrest. She had CPR at home and did have return of spontaneous circulation but hypotensive on initial assessment. EKG does not show a STEMI. Plan to check labs. With epigastric tenderness I will get a CT scan of the abdomen to evaluate for AAA. IV fluid bolus for volume resuscitation. EXAM: CT ABDOMEN AND PELVIS WITHOUT CONTRAST TECHNIQUE: Multidetector CT imaging of the abdomen and pelvis was performed following the standard protocol without IV contrast. COMPARISON:  12/20/2004 FINDINGS: Lower chest: No  acute abnormality. Hepatobiliary: Liver normal in size attenuation. Gallbladder is  distended. No gallstones. No wall thickening or pericholecystic inflammation no bile duct dilation. Pancreas: Unremarkable. No pancreatic ductal dilatation or surrounding inflammatory changes. Spleen: Normal in size without focal abnormality. Adrenals/Urinary Tract: No adrenal masses. Kidneys normal size, orientation and position. No renal masses. Are renal vascular calcifications. No collecting system stones. No hydronephrosis. Ureters normal in course and in caliber.  No ureteral stones. Bladder is unremarkable. Stomach/Bowel: Stomach and small bowel unremarkable colon is normal in caliber. No colonic wall thickening or adjacent inflammation. Scattered colonic diverticula. Normal appendix visualized. Vascular/Lymphatic: There is aortic ectasia and diffuse atherosclerotic calcifications. Aorta is dilated to 3.2 cm at the aortic hiatus. The infrarenal abdominal aorta is dilated to 2.4 cm, anterior posterior, by 2.5 cm transversely. Aortic ectasia has increased compared to the prior CT. No adenopathy. Reproductive: Status post hysterectomy. No adnexal masses. Other: No abdominal wall hernia or abnormality. No abdominopelvic ascites. Musculoskeletal: No fracture or acute finding. No osteoblastic or osteolytic lesions. IMPRESSION: 1. No acute findings. 2. Aortic ectasia. Maximum aortic transverse dimension is 2.4 x 2.5 cm. No aneurysm rupture. Ectatic abdominal aorta at risk for aneurysm development. Recommend followup by ultrasound in 5 years. This recommendation follows ACR consensus guidelines: White Paper of the ACR Incidental Findings Committee II on Vascular Findings. J Am Coll Radiol 2013; 10:789-794. Electronically Signed   By: Amie Portland M.D.   On: 11/17/2017 15:44   Dg Chest Port 1 View  Result Date: 11/17/2017 CLINICAL DATA:  Syncope, hypotension, cysts underwent CPR, history coronary artery disease, hypertension, arrhythmia EXAM: PORTABLE CHEST 1 VIEW COMPARISON:  Portable exam 1722 hours compared  10/09/2016 FINDINGS: LEFT subclavian sequential pacemaker leads project over RIGHT atrium and questionably near tricuspid valve versus proximal RIGHT ventricle, unchanged. Minimal enlargement of cardiac silhouette. Atherosclerotic calcification aorta. Mediastinal contours and pulmonary vascularity normal. Lungs clear. No infiltrate, pleural effusion or pneumothorax. Bones demineralized. IMPRESSION: Enlargement of cardiac silhouette post pacemaker. No acute abnormalities. Electronically Signed   By: Ulyses Southward M.D.   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  He received 2 L of normal saline, she feels better so out of bed to chair, check orthostatic vitals.-Year-old note, patient has pacemaker in detail interrogatory and it is working well. 2.  Hypokalemia: Improved with supplements. 3.  History of carotid disease, did not have follow-up ultrasound and patient did not follow-up.  Continue aspirin, statins, follow-up as an outpatient for repeat carotid carotid ultrasound 4.  Elevated troponins likely demand ischemia, cardiology is not planning to do cardiac work-up at this time. 5.  Acute kidney injury: Improving. GI, DVT prophylaxis.     All the records are reviewed and case discussed with Care Management/Social Workerr. Management plans discussed with the patient, family and they are in agreement.  CODE STATUS: Full code  TOTAL TIME TAKING CARE OF THIS PATIENT: 38 minutes.  More than 50% time spent in counseling, coordination of care  POSSIBLE D/C IN 1-2 DAYS, DEPENDING ON CLINICAL CONDITION.   Katha Hamming M.D on 11/18/2017 at 5:00 PM  Between 7am to 6pm - Pager - [phone]  After 6pm go to  [email or web address] - password [email or web address] [facility]  [facility]  Office  [phone]  CC: Primary  care physician; Gustavo Lah, MD

## 2017-11-18 NOTE — Progress Notes (Signed)
Progress Note  Patient Name: Shannon Chen Date of Encounter: 11/18/2017  Primary Cardiologist: Julien Nordmann, MD   Subjective   No recurrent presyncope/syncope.  No events on tele.  Hasn't been out of bed yet.  BP's trending 150's since admission.  Inpatient Medications    Scheduled Meds: . aspirin EC  81 mg Oral Daily  . carvedilol  25 mg Oral BID  . diltiazem  30 mg Oral Q6H  . enoxaparin (LOVENOX) injection  40 mg Subcutaneous Q24H  . rosuvastatin  40 mg Oral q1800  . sodium chloride flush  3 mL Intravenous Q12H   Continuous Infusions: . sodium chloride 500 mL (11/17/17 1912)   PRN Meds: sodium chloride, acetaminophen **OR** acetaminophen, albuterol, hydrALAZINE, ondansetron **OR** ondansetron (ZOFRAN) IV, polyethylene glycol   Vital Signs    Vitals:   11/17/17 1950 11/18/17 0424 11/18/17 0500 11/18/17 0759  BP: (!) 145/70 (!) 159/77  (!) 158/66  Pulse: 95 79  81  Resp: 19 16  18   Temp: 98.3 F (36.8 C) 98.3 F (36.8 C)  97.9 F (36.6 C)  TempSrc: Oral Oral    SpO2: 99% 100%  97%  Weight:   66.2 kg   Height:        Intake/Output Summary (Last 24 hours) at 11/18/2017 1025 Last data filed at 11/18/2017 0626 Gross per 24 hour  Intake 1103 ml  Output 150 ml  Net 953 ml   Filed Weights   11/17/17 1457 11/18/17 0500  Weight: 68 kg 66.2 kg    Telemetry    RSR - Personally Reviewed  Physical Exam   GEN: No acute distress.   Neck: No JVD Cardiac: RRR, 2/6 syst murmur loudest @ upper sternal borders, heard throughout, no rubs, or gallops.  Respiratory: diminished breath sounds bilaterally. GI: Soft, nontender, non-distended  MS: No edema; No deformity. Neuro:  Nonfocal  Psych: Flat affect   Labs    Chemistry Recent Labs  Lab 11/17/17 1445 11/18/17 0533  NA 141 142  K 2.8* 3.4*  CL 105 109  CO2 23 26  GLUCOSE 196* 110*  BUN 32* 23  CREATININE 1.39* 0.68  CALCIUM 8.6* 8.1*  PROT 7.0  --   ALBUMIN 3.7  --   AST 15  --   ALT 11  --     ALKPHOS 60  --   BILITOT 1.2  --   GFRNONAA 36* >60  GFRAA 42* >60  ANIONGAP 13 7     Hematology Recent Labs  Lab 11/17/17 1445 11/18/17 0533  WBC 9.7 6.6  RBC 4.27 4.04  HGB 13.1 12.2  HCT 37.3 35.3  MCV 87.4 87.4  MCH 30.6 30.2  MCHC 35.0 34.5  RDW 13.4 13.2  PLT 296 258    Cardiac Enzymes Recent Labs  Lab 11/17/17 1445 11/17/17 1857 11/18/17 0005  TROPONINI 0.03* 0.06* 0.09*      Radiology    Ct Abdomen Pelvis Wo Contrast  Result Date: 11/17/2017 CLINICAL DATA:  Patient presents ill-appearing after episode of syncope at home during which she was suspected to have a cardiac arrest. She had CPR at home and did have return of spontaneous circulation but hypotensive on initial assessment. EKG does not show a STEMI. Plan to check labs. With epigastric tenderness I will get a CT scan of the abdomen to evaluate for AAA. IV fluid bolus for volume resuscitation. EXAM: CT ABDOMEN AND PELVIS WITHOUT CONTRAST TECHNIQUE: Multidetector CT imaging of the abdomen and pelvis was performed following  the standard protocol without IV contrast. COMPARISON:  12/20/2004 FINDINGS: Lower chest: No acute abnormality. Hepatobiliary: Liver normal in size attenuation. Gallbladder is distended. No gallstones. No wall thickening or pericholecystic inflammation no bile duct dilation. Pancreas: Unremarkable. No pancreatic ductal dilatation or surrounding inflammatory changes. Spleen: Normal in size without focal abnormality. Adrenals/Urinary Tract: No adrenal masses. Kidneys normal size, orientation and position. No renal masses. Are renal vascular calcifications. No collecting system stones. No hydronephrosis. Ureters normal in course and in caliber.  No ureteral stones. Bladder is unremarkable. Stomach/Bowel: Stomach and small bowel unremarkable colon is normal in caliber. No colonic wall thickening or adjacent inflammation. Scattered colonic diverticula. Normal appendix visualized. Vascular/Lymphatic:  There is aortic ectasia and diffuse atherosclerotic calcifications. Aorta is dilated to 3.2 cm at the aortic hiatus. The infrarenal abdominal aorta is dilated to 2.4 cm, anterior posterior, by 2.5 cm transversely. Aortic ectasia has increased compared to the prior CT. No adenopathy. Reproductive: Status post hysterectomy. No adnexal masses. Other: No abdominal wall hernia or abnormality. No abdominopelvic ascites. Musculoskeletal: No fracture or acute finding. No osteoblastic or osteolytic lesions. IMPRESSION: 1. No acute findings. 2. Aortic ectasia. Maximum aortic transverse dimension is 2.4 x 2.5 cm. No aneurysm rupture. Ectatic abdominal aorta at risk for aneurysm development. Recommend followup by ultrasound in 5 years. This recommendation follows ACR consensus guidelines: White Paper of the ACR Incidental Findings Committee II on Vascular Findings. J Am Coll Radiol 2013; 10:789-794. Electronically Signed   By: Amie Portland M.D.   On: 11/17/2017 15:44   Dg Chest Port 1 View  Result Date: 11/17/2017 CLINICAL DATA:  Syncope, hypotension, cysts underwent CPR, history coronary artery disease, hypertension, arrhythmia EXAM: PORTABLE CHEST 1 VIEW COMPARISON:  Portable exam 1722 hours compared 10/09/2016 FINDINGS: LEFT subclavian sequential pacemaker leads project over RIGHT atrium and questionably near tricuspid valve versus proximal RIGHT ventricle, unchanged. Minimal enlargement of cardiac silhouette. Atherosclerotic calcification aorta. Mediastinal contours and pulmonary vascularity normal. Lungs clear. No infiltrate, pleural effusion or pneumothorax. Bones demineralized. IMPRESSION: Enlargement of cardiac silhouette post pacemaker. No acute abnormalities. Electronically Signed   By: Ulyses Southward M.D.   On: 11/17/2017 17:38    Cardiac Studies   2D Echocardiogram 10.1.2019  Study Conclusions   - Left ventricle: The cavity size was normal. Wall thickness was   increased in a pattern of severe LVH. The  basal inferior wall is   relatively spared. Systolic function was hyperdynamic. The   estimated ejection fraction was in the range of 70% to 75%. Wall   motion was normal; there were no regional wall motion   abnormalities. Doppler parameters are consistent with abnormal   left ventricular relaxation (grade 1 diastolic dysfunction). - Aortic valve: Transvalvular velocity was minimally increased.   There was very mild stenosis versus dynamic LVOT obstruction. - Mitral valve: Calcified annulus. - Right ventricle: The cavity size was normal. Systolic function   was normal.   Impressions:   - Hyperdynamic LVEF with severe LVH. Hypertrophic cardiomyopathy   and dynamic LVOT obstruction (HOCM) cannot be excluded.   Patient Profile     72 y.o. female 72 y.o. 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  Echo shows HOCM.  Assessment & Plan    1.  Syncope/left ventricular hypertrophy: Patient presented to [facility] on October 1 after slumping over while sitting at home.  She was unresponsive for  15 minutes and apparently pulseless.  Family member performed CPR.  She was hypotensive upon EMS arrival with a systolic blood pressure in the 80s.  In the emergency department, BUN and creatinine were elevated at 32 and 1.39.  She was hypokalemic at 2.8.  Pacemaker interrogation shows normal device function.  She did have several brief episodes of nonsustained ventricular tachycardia, most recently occurring November 09, 2017.  There were no episodes recorded on the day of admission to explain syncope.  Echocardiogram performed last night shows hyperdynamic LV function with severe LVH and mild aortic stenosis versus dynamic LVOT obstruction.  We suspect that symptoms leading to admission were most likely secondary to dehydration in the  setting of hypertrophic cardiomyopathy.  She has already received 2 L IV fluids and adequate oral hydration in the future will be crucial.  Afterload reducing agents can contribute to syncope in the setting and therefore, we have discontinued her losartan.  Hydralazine was held on admission and this should remain discontinued.  We will titrate her beta-blocker to 25 mg twice daily and add short acting diltiazem 30 mg every 6 hours with a plan to consolidate this tomorrow depending on how blood pressure does today.  Check orthostatics.  Though I do not think that arrhythmia caused symptoms yesterday, in the setting of findings of periodic nonsustained ventricular tachycardia with hypertrophic cardiomyopathy and syncope, we will plan on referral back to electrophysiology as an outpatient for consideration of ICD therapy if appropriate.  2.  Essential hypertension: See discussion above.  3.  Cerebrovascular disease: Known moderate to severe bilateral internal carotid disease.  In the setting of hypertension, this certainly could have contributed to altered mental status.  She was supposed to have had a follow-up carotid ultrasound in April however this never occurred.  This will need to be done as an outpatient.  Continue statin therapy.  4.  Elevated troponin: Likely demand ischemia in the setting of hypotension on admission.  Low risk stress test in 2018.  No wall motion abnormalities on echo.  Would not pursue ischemic evaluation at this time.  Continue aspirin, beta-blocker, and statin.  5.  Hypokalemia: Supplement.  6.  Acute kidney injury: Improved with IV fluids.  For questions or updates, please contact [facility] Please consult [email or web address] for contact info under        Signed, Nicolasa Ducking, NP  11/18/2017, 10:25 AM

## 2017-11-19 DIAGNOSIS — I421 Obstructive hypertrophic cardiomyopathy: Secondary | ICD-10-CM

## 2017-11-19 LAB — BASIC METABOLIC PANEL
ANION GAP: 5 (ref 5–15)
BUN: 22 mg/dL (ref 8–23)
CO2: 25 mmol/L (ref 22–32)
Calcium: 8.5 mg/dL — ABNORMAL LOW (ref 8.9–10.3)
Chloride: 111 mmol/L (ref 98–111)
Creatinine, Ser: 0.62 mg/dL (ref 0.44–1.00)
Glucose, Bld: 128 mg/dL — ABNORMAL HIGH (ref 70–99)
POTASSIUM: 3.8 mmol/L (ref 3.5–5.1)
SODIUM: 141 mmol/L (ref 135–145)

## 2017-11-19 LAB — TROPONIN I: Troponin I: 0.09 ng/mL (ref <0.03)

## 2017-11-19 MED ORDER — DILTIAZEM HCL ER COATED BEADS 120 MG PO CP24: 120.0000 mg | ORAL_CAPSULE | Freq: Every day | ORAL | Status: DC

## 2017-11-19 NOTE — Evaluation (Signed)
Physical Therapy Evaluation Patient Details Name: Shannon Chen MRN: [id number] DOB: 11/11/1945 Today's Date: 11/19/2017   History of Present Illness  Pt is a 72 y.o female presenting with syncope and collapse and hypotension. PMH significant for AV block, CAD, pacemaker, and hypertension.   Clinical Impression  Pt awake and willing to work with PT. Pt demonstrated limited deficits in strength as compared to baseline and overall is [facility]. Pt independent with all bed mobility and basic transfers with no assistance needed. Ambulation of 200 feet with no AD was safe with occasional mild balance nuances noted, however pt reporting this is typical to baseline. HR reaching as high as 77 during ambulation and at rest in mid 60's. SpO2 remaining in high 90's t/o. Pt is relatively near baseline with minimal strength and balance loss due to hospital stay. Pt would benefit from skilled PT via HHPT to conduct balance training that would improve overall safety and prevent falls at home.      Follow Up Recommendations Home health PT    Equipment Recommendations  None recommended by PT    Recommendations for Other Services       Precautions / Restrictions Precautions Precautions: Fall      Mobility  Bed Mobility Overal bed mobility: Independent                Transfers Overall transfer level: Independent                  Ambulation/Gait Ambulation/Gait assistance: Min guard Gait Distance (Feet): 200 Feet Assistive device: None       General Gait Details: pt with occasional and inconsistent unsteadiness that did not affect safety, occasional 1 UE support on hand rail but otherwise not needed  Stairs            Wheelchair Mobility    Modified Rankin (Stroke Patients Only)       Balance Overall balance assessment: Mild deficits observed, not formally tested(pt reporting intermittent dizziness that subsides within seconds, inconsistent balance impairments not  affecting safety)   Sitting balance-Leahy Scale: Normal       Standing balance-Leahy Scale: Good       Tandem Stance - Right Leg: 5(LOB) Tandem Stance - Left Leg: 5(LOB) Rhomberg - Eyes Opened: 20 Rhomberg - Eyes Closed: 10(LOB)                 Pertinent Vitals/Pain Pain Assessment: No/denies pain    Home Living                        Prior Function                 Hand Dominance        Extremity/Trunk Assessment   Upper Extremity Assessment Upper Extremity Assessment: Overall WFL for tasks assessed    Lower Extremity Assessment Lower Extremity Assessment: Overall WFL for tasks assessed       Communication      Cognition Arousal/Alertness: Awake/alert Behavior During Therapy: WFL for tasks assessed/performed Overall Cognitive Status: Within Functional Limits for tasks assessed                                        General Comments General comments (skin integrity, edema, etc.): HR in mid 60's at beginning of eval reaching as high as 77 during ambulation/ SpO2 in high 90's t/o  eval    Exercises     Assessment/Plan    PT Assessment Patient needs continued PT services  PT Problem List Decreased balance;Decreased safety awareness;Decreased strength       PT Treatment Interventions Stair training;Therapeutic exercise;Therapeutic activities;Patient/family education;Functional mobility training;Gait training    PT Goals (Current goals can be found in the Care Plan section)  Acute Rehab PT Goals Patient Stated Goal: to go to rehab PT Goal Formulation: With patient Time For Goal Achievement: 12/03/17 Potential to Achieve Goals: Poor    Frequency Min 2X/week   Barriers to discharge        Co-evaluation               AM-PAC PT "6 Clicks" Daily Activity  Outcome Measure Difficulty turning over in bed (including adjusting bedclothes, sheets and blankets)?: None Difficulty moving from lying on back to  sitting on the side of the bed? : None Difficulty sitting down on and standing up from a chair with arms (e.g., wheelchair, bedside commode, etc,.)?: None Help needed moving to and from a bed to chair (including a wheelchair)?: None Help needed walking in hospital room?: A Little Help needed climbing 3-5 steps with a railing? : A Little 6 Click Score: 22    End of Session Equipment Utilized During Treatment: Gait belt Activity Tolerance: Patient tolerated treatment well Patient left: in bed;with bed alarm set;with call bell/phone within reach Nurse Communication: Mobility status PT Visit Diagnosis: Unsteadiness on feet (R26.81);History of falling (Z91.81)    Time: 1610-9604 PT Time Calculation (min) (ACUTE ONLY): 19 min   Charges:              Mickel Duhamel, SPT 11/19/2017, 4:04 PM

## 2017-11-19 NOTE — Care Management Note (Signed)
Case Management Note  Patient Details  Name: Shannon Chen MRN: [id number] Date of Birth: 26-May-1945  Subjective/Objective:        Patient admitted with syncope.  Negative troponin's.  From home, lives alone.  No difficulties obtaining medications or with transportation.  She uses her sisters car to transport.  She has a cardiologist but no PCP.  Offered to make an appointment for her with a PCP and she said she has a list at home and she wants to call when she gets home.  States she is not happy with current living situation.  Does not explain why when asked what she doesn't like about it. PT to evaluate.  She says she would like to move into a nursing home.  PT is evaluating now.           Action/Plan:   Expected Discharge Date:                  Expected Discharge Plan:  Home/Self Care  In-House Referral:     Discharge planning Services     Post Acute Care Choice:    Choice offered to:     DME Arranged:    DME Agency:     HH Arranged:    HH Agency:     Status of Service:  In process, will continue to follow  If discussed at Long Length of Stay Meetings, dates discussed:    Additional Comments:  Sherren Kerns, RN 11/19/2017, 3:18 PM

## 2017-11-19 NOTE — Progress Notes (Signed)
Progress Note  Patient Name: Shannon Chen Date of Encounter: 11/19/2017  Primary Cardiologist: Julien Nordmann, MD  Subjective   No recurrent syncope.  Denies ever experiencing presyncope.  No c/p or dyspnea.  Good spirits this AM.    Inpatient Medications    Scheduled Meds: . aspirin EC  81 mg Oral Daily  . carvedilol  25 mg Oral BID  . diltiazem  30 mg Oral Q6H  . enoxaparin (LOVENOX) injection  40 mg Subcutaneous Q24H  . rosuvastatin  40 mg Oral q1800  . sodium chloride flush  3 mL Intravenous Q12H   Continuous Infusions: . sodium chloride 500 mL (11/17/17 1912)   PRN Meds: sodium chloride, acetaminophen **OR** acetaminophen, albuterol, hydrALAZINE, ondansetron **OR** ondansetron (ZOFRAN) IV, polyethylene glycol   Vital Signs    Vitals:   11/18/17 1550 11/18/17 2016 11/19/17 0413 11/19/17 0722  BP: (!) 137/52 126/77 (!) 163/87 (!) 136/53  Pulse: 70 69 65 (!) 59  Resp: 18 18 18 18   Temp: 97.9 F (36.6 C) 98 F (36.7 C) 98 F (36.7 C) 97.7 F (36.5 C)  TempSrc:  Oral Oral Oral  SpO2: 99% 99% 100% 99%  Weight:   67.7 kg   Height:        Intake/Output Summary (Last 24 hours) at 11/19/2017 1231 Last data filed at 11/19/2017 0949 Gross per 24 hour  Intake 120 ml  Output 200 ml  Net -80 ml   Filed Weights   11/17/17 1457 11/18/17 0500 11/19/17 0413  Weight: 68 kg 66.2 kg 67.7 kg    Physical Exam   GEN: Well nourished, well developed, in no acute distress.  HEENT: Grossly normal.  Neck: Supple, no JVD, carotid bruits, or masses. Cardiac: RRR, 2/6 syst murmur loudest @ upper sternal borders, no rubs, or gallops. No clubbing, cyanosis, edema.  Radials/DP/PT 2+ and equal bilaterally.  Respiratory:  Respirations regular and unlabored, diminished breath sounds bilaterally. GI: Soft, nontender, nondistended, BS + x 4. MS: no deformity or atrophy. Skin: warm and dry, no rash. Neuro:  Strength and sensation are intact. Psych: AAOx3.  Normal affect.  Labs    Chemistry Recent Labs  Lab 11/17/17 1445 11/18/17 0533 11/19/17 0433  NA 141 142 141  K 2.8* 3.4* 3.8  CL 105 109 111  CO2 23 26 25   GLUCOSE 196* 110* 128*  BUN 32* 23 22  CREATININE 1.39* 0.68 0.62  CALCIUM 8.6* 8.1* 8.5*  PROT 7.0  --   --   ALBUMIN 3.7  --   --   AST 15  --   --   ALT 11  --   --   ALKPHOS 60  --   --   BILITOT 1.2  --   --   GFRNONAA 36* >60 >60  GFRAA 42* >60 >60  ANIONGAP 13 7 5      Hematology Recent Labs  Lab 11/17/17 1445 11/18/17 0533  WBC 9.7 6.6  RBC 4.27 4.04  HGB 13.1 12.2  HCT 37.3 35.3  MCV 87.4 87.4  MCH 30.6 30.2  MCHC 35.0 34.5  RDW 13.4 13.2  PLT 296 258    Cardiac Enzymes Recent Labs  Lab 11/17/17 1445 11/17/17 1857 11/18/17 0005  TROPONINI 0.03* 0.06* 0.09*      Radiology    Ct Abdomen Pelvis Wo Contrast  Result Date: 11/17/2017 CLINICAL DATA:  Patient presents ill-appearing after episode of syncope at home during which she was suspected to have a cardiac arrest. She had CPR  at home and did have return of spontaneous circulation but hypotensive on initial assessment. EKG does not show a STEMI. Plan to check labs. With epigastric tenderness I will get a CT scan of the abdomen to evaluate for AAA. IV fluid bolus for volume resuscitation. EXAM: CT ABDOMEN AND PELVIS WITHOUT CONTRAST TECHNIQUE: Multidetector CT imaging of the abdomen and pelvis was performed following the standard protocol without IV contrast. COMPARISON:  12/20/2004 FINDINGS: Lower chest: No acute abnormality. Hepatobiliary: Liver normal in size attenuation. Gallbladder is distended. No gallstones. No wall thickening or pericholecystic inflammation no bile duct dilation. Pancreas: Unremarkable. No pancreatic ductal dilatation or surrounding inflammatory changes. Spleen: Normal in size without focal abnormality. Adrenals/Urinary Tract: No adrenal masses. Kidneys normal size, orientation and position. No renal masses. Are renal vascular calcifications. No  collecting system stones. No hydronephrosis. Ureters normal in course and in caliber.  No ureteral stones. Bladder is unremarkable. Stomach/Bowel: Stomach and small bowel unremarkable colon is normal in caliber. No colonic wall thickening or adjacent inflammation. Scattered colonic diverticula. Normal appendix visualized. Vascular/Lymphatic: There is aortic ectasia and diffuse atherosclerotic calcifications. Aorta is dilated to 3.2 cm at the aortic hiatus. The infrarenal abdominal aorta is dilated to 2.4 cm, anterior posterior, by 2.5 cm transversely. Aortic ectasia has increased compared to the prior CT. No adenopathy. Reproductive: Status post hysterectomy. No adnexal masses. Other: No abdominal wall hernia or abnormality. No abdominopelvic ascites. Musculoskeletal: No fracture or acute finding. No osteoblastic or osteolytic lesions. IMPRESSION: 1. No acute findings. 2. Aortic ectasia. Maximum aortic transverse dimension is 2.4 x 2.5 cm. No aneurysm rupture. Ectatic abdominal aorta at risk for aneurysm development. Recommend followup by ultrasound in 5 years. This recommendation follows ACR consensus guidelines: White Paper of the ACR Incidental Findings Committee II on Vascular Findings. J Am Coll Radiol 2013; 10:789-794. Electronically Signed   By: Amie Portland M.D.   On: 11/17/2017 15:44   Dg Chest Port 1 View  Result Date: 11/17/2017 CLINICAL DATA:  Syncope, hypotension, cysts underwent CPR, history coronary artery disease, hypertension, arrhythmia EXAM: PORTABLE CHEST 1 VIEW COMPARISON:  Portable exam 1722 hours compared 10/09/2016 FINDINGS: LEFT subclavian sequential pacemaker leads project over RIGHT atrium and questionably near tricuspid valve versus proximal RIGHT ventricle, unchanged. Minimal enlargement of cardiac silhouette. Atherosclerotic calcification aorta. Mediastinal contours and pulmonary vascularity normal. Lungs clear. No infiltrate, pleural effusion or pneumothorax. Bones demineralized.  IMPRESSION: Enlargement of cardiac silhouette post pacemaker. No acute abnormalities. Electronically Signed   By: Ulyses Southward M.D.   On: 11/17/2017 17:38    Telemetry    Sinus rhythm w/ atrial pacing on demand - Personally Reviewed  Cardiac Studies   2D Echocardiogram 10.1.2019  Study Conclusions  - Left ventricle: The cavity size was normal. Wall thickness was increased in a pattern of severe LVH. The basal inferior wall is relatively spared. Systolic function was hyperdynamic. The estimated ejection fraction was in the range of 70% to 75%. Wall motion was normal; there were no regional wall motion abnormalities. Doppler parameters are consistent with abnormal left ventricular relaxation (grade 1 diastolic dysfunction). - Aortic valve: Transvalvular velocity was minimally increased. There was very mild stenosis versus dynamic LVOT obstruction. - Mitral valve: Calcified annulus. - Right ventricle: The cavity size was normal. Systolic function was normal.  Impressions:  - Hyperdynamic LVEF with severe LVH. Hypertrophic cardiomyopathy and dynamic LVOT obstruction (HOCM) cannot be excluded.  Patient Profile     72 y.o. female 72 y.o.femalewith a hx of Mobitz type II second-degree  AV block status post dual-chamber pacemaker (09/2016), essential hypertension, hyperlipidemia, and carotid artery stenosis admitted 10/1 following a syncopal spell.  Echo shows HOCM.  Assessment & Plan    1.  Syncope/LVH: Admitted 10/1 after becoming unresponsive @ home - x 15 mins.  CPR by family member.  Reportedly pulseless.  Hypotensive on EMS arrival.  BUN/Creat 32/1.39 on arrival and hypokalemic.  Pacer interrogation showed nl device fxn w/ brief episodes of NSVT, though last episode was 11/09/17. No arrhythmias to explain symptoms on day of admission.  Echo 10/1 showed hyperdynamic LV fxn w/ severe LVH and mild Ao stenosis vs dynamic LVOT obstruction.  Ss most likely 2/2  dehydration in the setting of possible hypertropic cardiomyopathy.  She has been aggressively hydrated and we've adjusted her meds, discontinuing afterload reducing agents such as hydralazine and losartan.   blocker titrated and dilt added yesterday.  BP and HR stable this AM. Will consolidate dilt to 120 daily.  No arrhythmias on tele or recurrent Ss.  Ok for d/c today.  We will arrange for outpt EP f/u with Odessa Fleming, MD to consider upgrade to ICD in setting of HCM, NSVT, and syncope (not known to be related to NSVT).  2.  Essential HTN:  Stable on  blocker and dilt.  3.  Elevated trop:  Demand isch in the setting of hypotension on admission.  Low risk nuc in 2018.  Echo w/o wma this admission.  Cont asa,  blocker, and statin.  No ischemic eval planned @ this time.  4.  Hypokalemia:  Stable.  5.  AKI:  Resolved.  6.  Cerebrovascular dzs:  Mod to sev bilateral ICA stenoses.  In setting of Hypotension, certainly could have contributed to AMS.  Will need outpt carotid u/s in f/u.  Signed, Nicolasa Ducking, NP  11/19/2017, 12:31 PM    For questions or updates, please contact   Please consult [email or web address] for contact info under Cardiology/STEMI.

## 2017-11-19 NOTE — Progress Notes (Signed)
[facility] - [place] [facility]   PATIENT NAME: Shannon Chen    MR#:  [id number]  DATE OF BIRTH:  02/21/1945  Patient feels better, no further shortness of breath .requesting to go to rehab.  CHIEF COMPLAINT:   Chief Complaint  Patient presents with  . Loss of Consciousness    REVIEW OF SYSTEMS:    Review of Systems  Constitutional: Negative for chills and fever.  HENT: Negative for hearing loss.   Eyes: Negative for blurred vision, double vision and photophobia.  Respiratory: Negative for cough, hemoptysis and shortness of breath.   Cardiovascular: Negative for palpitations, orthopnea and leg swelling.  Gastrointestinal: Negative for abdominal pain, diarrhea and vomiting.  Genitourinary: Negative for dysuria and urgency.  Musculoskeletal: Negative for myalgias and neck pain.  Skin: Negative for rash.  Neurological: Negative for dizziness, focal weakness, seizures, weakness and headaches.  Psychiatric/Behavioral: Negative for memory loss. The patient does not have insomnia.     Nutrition: Tolerating Diet: Tolerating PT:      DRUG ALLERGIES:  No Known Allergies  VITALS:  Blood pressure (!) 136/53, pulse (!) 59, temperature 97.7 F (36.5 C), temperature source Oral, resp. rate 18, height 5\' 5"  (1.651 m), weight 67.7 kg, SpO2 99 %.  PHYSICAL EXAMINATION:   Physical Exam  GENERAL:  72 y.o.-year-old patient lying in the bed with no acute distress.  EYES: Pupils equal, round, reactive to light and accommodation. No scleral icterus. Extraocular muscles intact.  HEENT: Head atraumatic, normocephalic. Oropharynx and nasopharynx clear.  NECK:  Supple, no jugular venous distention. No thyroid enlargement, no tenderness.  LUNGS: Normal breath sounds bilaterally, no wheezing, rales,rhonchi or crepitation. No use of accessory muscles of respiration.  CARDIOVASCULAR: S1, S2 normal. No murmurs, rubs, or gallops.  ABDOMEN: Soft, nontender,  nondistended. Bowel sounds present. No organomegaly or mass.  EXTREMITIES: No pedal edema, cyanosis, or clubbing.  NEUROLOGIC: Cranial nerves II through XII are intact. Muscle strength 5/5 in all extremities. Sensation intact. Gait not checked.  PSYCHIATRIC: The patient is alert and oriented x 3.  SKIN: No obvious rash, lesion, or ulcer.    LABORATORY PANEL:   CBC Recent Labs  Lab 11/18/17 0533  WBC 6.6  HGB 12.2  HCT 35.3  PLT 258   ------------------------------------------------------------------------------------------------------------------  Chemistries  Recent Labs  Lab 11/17/17 1445  11/19/17 0433  NA 141   < > 141  K 2.8*   < > 3.8  CL 105   < > 111  CO2 23   < > 25  GLUCOSE 196*   < > 128*  BUN 32*   < > 22  CREATININE 1.39*   < > 0.62  CALCIUM 8.6*   < > 8.5*  MG 2.1  --   --   AST 15  --   --   ALT 11  --   --   ALKPHOS 60  --   --   BILITOT 1.2  --   --    < > = values in this interval not displayed.   ------------------------------------------------------------------------------------------------------------------  Cardiac Enzymes Recent Labs  Lab 11/18/17 0005  TROPONINI 0.09*   ------------------------------------------------------------------------------------------------------------------  RADIOLOGY:  Ct Abdomen Pelvis Wo Contrast  Result Date: 11/17/2017 CLINICAL DATA:  Patient presents ill-appearing after episode of syncope at home during which she was suspected to have a cardiac arrest. She had CPR at home and did have return of spontaneous circulation but hypotensive on initial assessment. EKG does not show a STEMI. Plan  to check labs. With epigastric tenderness I will get a CT scan of the abdomen to evaluate for AAA. IV fluid bolus for volume resuscitation. EXAM: CT ABDOMEN AND PELVIS WITHOUT CONTRAST TECHNIQUE: Multidetector CT imaging of the abdomen and pelvis was performed following the standard protocol without IV contrast. COMPARISON:   12/20/2004 FINDINGS: Lower chest: No acute abnormality. Hepatobiliary: Liver normal in size attenuation. Gallbladder is distended. No gallstones. No wall thickening or pericholecystic inflammation no bile duct dilation. Pancreas: Unremarkable. No pancreatic ductal dilatation or surrounding inflammatory changes. Spleen: Normal in size without focal abnormality. Adrenals/Urinary Tract: No adrenal masses. Kidneys normal size, orientation and position. No renal masses. Are renal vascular calcifications. No collecting system stones. No hydronephrosis. Ureters normal in course and in caliber.  No ureteral stones. Bladder is unremarkable. Stomach/Bowel: Stomach and small bowel unremarkable colon is normal in caliber. No colonic wall thickening or adjacent inflammation. Scattered colonic diverticula. Normal appendix visualized. Vascular/Lymphatic: There is aortic ectasia and diffuse atherosclerotic calcifications. Aorta is dilated to 3.2 cm at the aortic hiatus. The infrarenal abdominal aorta is dilated to 2.4 cm, anterior posterior, by 2.5 cm transversely. Aortic ectasia has increased compared to the prior CT. No adenopathy. Reproductive: Status post hysterectomy. No adnexal masses. Other: No abdominal wall hernia or abnormality. No abdominopelvic ascites. Musculoskeletal: No fracture or acute finding. No osteoblastic or osteolytic lesions. IMPRESSION: 1. No acute findings. 2. Aortic ectasia. Maximum aortic transverse dimension is 2.4 x 2.5 cm. No aneurysm rupture. Ectatic abdominal aorta at risk for aneurysm development. Recommend followup by ultrasound in 5 years. This recommendation follows ACR consensus guidelines: White Paper of the ACR Incidental Findings Committee II on Vascular Findings. J Am Coll Radiol 2013; 10:789-794. Electronically Signed   By: Amie Portland M.D.   On: 11/17/2017 15:44   Dg Chest Port 1 View  Result Date: 11/17/2017 CLINICAL DATA:  Syncope, hypotension, cysts underwent CPR, history  coronary artery disease, hypertension, arrhythmia EXAM: PORTABLE CHEST 1 VIEW COMPARISON:  Portable exam 1722 hours compared 10/09/2016 FINDINGS: LEFT subclavian sequential pacemaker leads project over RIGHT atrium and questionably near tricuspid valve versus proximal RIGHT ventricle, unchanged. Minimal enlargement of cardiac silhouette. Atherosclerotic calcification aorta. Mediastinal contours and pulmonary vascularity normal. Lungs clear. No infiltrate, pleural effusion or pneumothorax. Bones demineralized. IMPRESSION: Enlargement of cardiac silhouette post pacemaker. No acute abnormalities. Electronically Signed   By: Ulyses Southward M.D.   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  She feels better today, no arrhythmias noted.. 2.  Hypokalemia: Improved with supplements. 3.  History of carotid disease, did not have follow-up ultrasound and patient did not follow-up.  Continue aspirin, statins, follow-up as an outpatient for repeat carotid carotid ultrasound 4.  Elevated troponins likely demand ischemia, cardiology is not planning to do cardiac work-up at this time. 5.  Acute kidney injury: Improving. GI, DVT prophylaxis. #6 deconditioning, physical therapy consult, likely discharge home tomorrow with home health PT,depending on PT eval.    All the records are reviewed and case discussed with Care Management/Social Workerr. Management plans discussed with the patient, family and they are in agreement.  CODE STATUS: Full code  TOTAL TIME TAKING CARE OF THIS PATIENT: 38 minutes.  More than 50% time spent in counseling, coordination of care  POSSIBLE D/C  IN 1-2 DAYS, DEPENDING ON CLINICAL CONDITION.   Katha Hamming M.D on 11/19/2017 at 12:34 PM  Between 7am to 6pm - Pager - [phone]  After 6pm go to [email or web address] - password [email or web address] [facility]  [facility]  Office  [phone]  CC: Primary care physician; Gustavo Lah, MD

## 2017-11-20 LAB — BASIC METABOLIC PANEL
Anion gap: 6 (ref 5–15)
BUN: 15 mg/dL (ref 8–23)
CO2: 26 mmol/L (ref 22–32)
CREATININE: 0.65 mg/dL (ref 0.44–1.00)
Calcium: 8.3 mg/dL — ABNORMAL LOW (ref 8.9–10.3)
Chloride: 107 mmol/L (ref 98–111)
GFR calc Af Amer: >60 mL/min (ref >60)
Glucose, Bld: 108 mg/dL — ABNORMAL HIGH (ref 70–99)
POTASSIUM: 3.9 mmol/L (ref 3.5–5.1)
SODIUM: 139 mmol/L (ref 135–145)

## 2017-11-20 LAB — MAGNESIUM: MAGNESIUM: 1.8 mg/dL (ref 1.7–2.4)

## 2017-11-20 MED ORDER — DILTIAZEM HCL ER COATED BEADS 180 MG PO CP24: 180.0000 mg | ORAL_CAPSULE | Freq: Every day | ORAL | 0 refills | Status: DC

## 2017-11-20 MED ORDER — DILTIAZEM HCL ER COATED BEADS 180 MG PO CP24
180.0000 mg | ORAL_CAPSULE | Freq: Every day | ORAL | Status: DC
  Administered 2017-11-20: 180 mg via ORAL
  Filled 2017-11-20: qty 1

## 2017-11-20 MED ORDER — DILTIAZEM HCL ER COATED BEADS 120 MG PO CP24: 120.0000 mg | ORAL_CAPSULE | Freq: Every day | ORAL | 0 refills | Status: DC

## 2017-11-20 MED ORDER — CARVEDILOL 25 MG PO TABS: 25.0000 mg | ORAL_TABLET | Freq: Two times a day (BID) | ORAL | 0 refills | Status: DC

## 2017-11-20 NOTE — Care Management Important Message (Signed)
Copy of signed IM left with patient in room.  Patient also asked about ALFs or rest homes.  Gave her a list of area ALFs, F[facility]s, and RHs to check out.

## 2017-11-20 NOTE — Discharge Summary (Signed)
Shannon Chen, is a 72 y.o. female  DOB [birth year]  MRN [id number].  Admission date:  11/17/2017  Admitting Physician  Milagros Loll, MD  Discharge Date:  11/20/2017   Primary MD  Gustavo Lah, MD  Recommendations for primary care physician for things to follow:   Follow-up with PCP in 1 week Follow-up with Dr. [name] with EP physician, appointment to be scheduled by cardiology from [facility]   Admission Diagnosis  Cardiac arrest [facility]) [I46.9] Syncope and collapse [R55] Syncope [R55] Hypotension, unspecified hypotension type [I95.9]   Discharge Diagnosis  Cardiac arrest ([name]) [I46.9] Syncope and collapse [R55] Syncope [R55] Hypotension, unspecified hypotension type [I95.9]    Active Problems:   Syncope      Past Medical History:  Diagnosis Date  . Arthritis   . AV block, Mobitz II    a. 09/2016 s/p MDT [id number] Azure XT DR MRI DC PPM  . Carotid artery disease ([name])    a. 09/2016 Carotid U/S: RICA 70-99%, LICA 50-69%;  b. 09/2016 CTA Neck: RICA 60, RCCA 30, LICA 70%, L Vert 60, L Basilar 70.  . Essential hypertension   . Hyperlipidemia   . Systolic murmur    09/2016 Echo: EF 60-65%, no rwma, Gr1 DD, Ca2+ MV annulus.    Past Surgical History:  Procedure Laterality Date  . ABDOMINAL HYSTERECTOMY    . PACEMAKER IMPLANT N/A 10/08/2016   Procedure: Pacemaker Implant;  Surgeon: Marinus Maw, MD;  Location: [facility] INVASIVE CV LAB;  Service: Cardiovascular;  Laterality: N/A;       History of present illness and  Hospital Course:     Kindly see H&P for history of present illness and admission details, please review complete Labs, Consult reports and Test reports for all details in brief  HPI  from the history and physical done  72 year old female patient admitted for loss of consciousness.  Has history of  Mobitz type II heart block status post pacemaker, carotid disease, hypertension.  Patient admitted for evaluation of cardiac arrest, syncope, found to have hypo-tension, acute renal failure, hypokalemia.  Hospital Course   #1 syncope with LVH: Admitted after become unresponsive at home for 15 minutes, reportedly pulseless of family in bed CPR, admitted, patient has pacemaker that is interrogated which showed normal function and normal device, syncope thought to be secondary to dehydration, patient received IV fluids, did not have any arrhythmias on the monitor. 2.  Syncope, cardiac arrest, unresponsive at home for 15 minutes with CPR by family member, hypotensive on EMS arrival, patient had extensive evaluation of her pacemaker, and monitored on telemetry for any arrhythmias, echocardiogram showed hyperdynamic left ventricle with severe LVH, mild aortic stenosis versus dynamic LVOT obstruction: We discontinued afterload reducing agents like hydralazine, losartan.  Cardiology suggested up titrating beta-blocker and also added calcium channel blocker.  Patient did not have any active yes, hemodynamically stable state status stable.  Will discharge home today, cardiology will arrange for outpatient EP follow-up with Dr. [name] to consider ICD in the setting of her centimeters, and SVT and syncope. 3.  Elevated troponins likely demand ischemia.  Patient echo did not show any wall motion.  Continue beta-blockers, statins, no ischemia work-up plan by cardiology. 4.  Hypokalemia improved 5.  High acute renal failure due to dehydration: Improved after fluid resuscitation. 6.  History of cerebrovascular disease with bilateral carotid stenosis, patient is to follow-up with PCP as an outpatient for repeat ultrasound of carotids.    Discharge Condition: stable  Follow UP      Discharge Instructions  and  Discharge Medications      Allergies as of 11/20/2017   No Known Allergies     Medication List     STOP taking these medications   hydrALAZINE 25 MG tablet Commonly known as:  APRESOLINE   losartan 100 MG tablet Commonly known as:  COZAAR     TAKE these medications   carvedilol 25 MG tablet Commonly known as:  COREG Take 1 tablet (25 mg total) by mouth 2 (two) times daily. What changed:    medication strength  how much to take   diltiazem 180 MG 24 hr capsule Commonly known as:  CARDIZEM CD Take 1 capsule (180 mg total) by mouth daily.   rosuvastatin 40 MG tablet Commonly known as:  CRESTOR Take 1 tablet (40 mg total) by mouth daily at 6 PM.         Diet and Activity recommendation: See Discharge Instructions above   Consults obtained - cardio   Major procedures and Radiology Reports - PLEASE review detailed and final reports for all details, in bri   Ct Abdomen Pelvis Wo Contrast  Result Date: 11/17/2017 CLINICAL DATA:  Patient presents ill-appearing after episode of syncope at home during which she was suspected to have a cardiac arrest. She had CPR at home and did have return of spontaneous circulation but hypotensive on initial assessment. EKG does not show a STEMI. Plan to check labs. With epigastric tenderness I will get a CT scan of the abdomen to evaluate for AAA. IV fluid bolus for volume resuscitation. EXAM: CT ABDOMEN AND PELVIS WITHOUT CONTRAST TECHNIQUE: Multidetector CT imaging of the abdomen and pelvis was performed following the standard protocol without IV contrast. COMPARISON:  12/20/2004 FINDINGS: Lower chest: No acute abnormality. Hepatobiliary: Liver normal in size attenuation. Gallbladder is distended. No gallstones. No wall thickening or pericholecystic inflammation no bile duct dilation. Pancreas: Unremarkable. No pancreatic ductal dilatation or surrounding inflammatory changes. Spleen: Normal in size without focal abnormality. Adrenals/Urinary Tract: No adrenal masses. Kidneys normal size, orientation and position. No renal masses. Are renal  vascular calcifications. No collecting system stones. No hydronephrosis. Ureters normal in course and in caliber.  No ureteral stones. Bladder is unremarkable. Stomach/Bowel: Stomach and small bowel unremarkable colon is normal in caliber. No colonic wall thickening or adjacent inflammation. Scattered colonic diverticula. Normal appendix visualized. Vascular/Lymphatic: There is aortic ectasia and diffuse atherosclerotic calcifications. Aorta is dilated to 3.2 cm at the aortic hiatus. The infrarenal abdominal aorta is dilated to 2.4 cm, anterior posterior, by 2.5 cm transversely. Aortic ectasia has increased compared to the prior CT. No adenopathy. Reproductive: Status post hysterectomy. No adnexal masses. Other: No abdominal wall hernia or abnormality. No abdominopelvic ascites. Musculoskeletal: No fracture or acute finding. No osteoblastic or osteolytic lesions. IMPRESSION: 1. No acute findings. 2. Aortic ectasia. Maximum aortic transverse dimension is 2.4 x 2.5 cm. No aneurysm rupture. Ectatic abdominal aorta at risk for aneurysm development. Recommend followup by ultrasound in 5 years. This recommendation follows ACR consensus guidelines: White Paper of the ACR Incidental Findings Committee II on Vascular Findings. J Am Coll Radiol 2013; 10:789-794. Electronically Signed   By: Amie Portland M.D.   On: 11/17/2017 15:44   Dg Chest Port 1 View  Result Date: 11/17/2017 CLINICAL DATA:  Syncope, hypotension, cysts underwent CPR, history coronary artery disease, hypertension, arrhythmia EXAM: PORTABLE CHEST 1 VIEW COMPARISON:  Portable exam 1722 hours compared 10/09/2016 FINDINGS: LEFT subclavian sequential pacemaker leads  project over RIGHT atrium and questionably near tricuspid valve versus proximal RIGHT ventricle, unchanged. Minimal enlargement of cardiac silhouette. Atherosclerotic calcification aorta. Mediastinal contours and pulmonary vascularity normal. Lungs clear. No infiltrate, pleural effusion or  pneumothorax. Bones demineralized. IMPRESSION: Enlargement of cardiac silhouette post pacemaker. No acute abnormalities. Electronically Signed   By: Ulyses Southward M.D.   On: 11/17/2017 17:38    Micro Results     No results found for this or any previous visit (from the past 240 hour(s)).     Today   Subjective:   Shannon Chen today has no headache,no chest abdominal pain,no new weakness tingling or numbness, feels much better wants to go home today.  Objective:   Blood pressure (!) 188/69, pulse 68, temperature 97.9 F (36.6 C), temperature source Oral, resp. rate 18, height 5\' 5"  (1.651 m), weight 68.5 kg, SpO2 100 %.   Intake/Output Summary (Last 24 hours) at 11/20/2017 0935 Last data filed at 11/20/2017 0735 Gross per 24 hour  Intake 480 ml  Output 1050 ml  Net -570 ml    Exam Awake Alert, Oriented x 3, No new F.N deficits, Normal affect [place].AT,PERRAL Supple Neck,No JVD, No cervical lymphadenopathy appriciated.  Symmetrical Chest wall movement, Good air movement bilaterally, CTAB RRR,No Gallops,Rubs or new Murmurs, No Parasternal Heave +ve B.Sounds, Abd Soft, Non tender, No organomegaly appriciated, No rebound -guarding or rigidity. No Cyanosis, Clubbing or edema, No new Rash or bruise  Data Review   CBC w Diff:  Lab Results  Component Value Date   WBC 6.6 11/18/2017   HGB 12.2 11/18/2017   HCT 35.3 11/18/2017   PLT 258 11/18/2017   LYMPHOPCT 48 11/17/2017   MONOPCT 6 11/17/2017   EOSPCT 0 11/17/2017   BASOPCT 0 11/17/2017    CMP:  Lab Results  Component Value Date   NA 139 11/20/2017   K 3.9 11/20/2017   CL 107 11/20/2017   CO2 26 11/20/2017   BUN 15 11/20/2017   CREATININE 0.65 11/20/2017   PROT 7.0 11/17/2017   ALBUMIN 3.7 11/17/2017   BILITOT 1.2 11/17/2017   ALKPHOS 60 11/17/2017   AST 15 11/17/2017   ALT 11 11/17/2017  .   Total Time in preparing paper work, data evaluation and todays exam - 35 minutes  Katha Hamming M.D on  11/20/2017 at 9:35 AM    Note: This dictation was prepared with Dragon dictation along with smaller phrase technology. Any transcriptional errors that result from this process are unintentional.

## 2017-11-20 NOTE — Care Management Note (Signed)
Case Management Note  Patient Details  Name: Shannon Chen MRN: [id number] Date of Birth: December 08, 1945  Subjective/Objective:    Patient will be discharging today to home.  PT recommended home health PT.  MD ordered as well.  RNCM to patient room to offer choice of home health agency.  She adamantly refuses home health services.  She states, "I will be in and out and I am just fine!".   Nephew to pick up patient today at discharge.            Action/Plan:Will notify Dr. Luberta Mutter.    Expected Discharge Date:  11/20/17               Expected Discharge Plan:  Home/Self Care  In-House Referral:     Discharge planning Services  CM Consult  Post Acute Care Choice:    Choice offered to:     DME Arranged:    DME Agency:     HH Arranged:    HH Agency:     Status of Service:  Completed, signed off  If discussed at [employer] of Stay Meetings, dates discussed:    Additional Comments:  Sherren Kerns, RN 11/20/2017, 10:30 AM

## 2017-11-20 NOTE — Progress Notes (Signed)
Progress Note  Patient Name: Shannon Chen Date of Encounter: 11/20/2017  Primary Cardiologist: Julien Nordmann, MD  Subjective   She says she felt lightheaded when using the bathroom earlier this morning but otherwise doing well.  Likely discharge today.  She did have 11 beats of nonsustained VT at 555 this morning she thinks she was sleeping at that time.  Inpatient Medications    Scheduled Meds: . aspirin EC  81 mg Oral Daily  . carvedilol  25 mg Oral BID  . diltiazem  180 mg Oral Daily  . enoxaparin (LOVENOX) injection  40 mg Subcutaneous Q24H  . rosuvastatin  40 mg Oral q1800  . sodium chloride flush  3 mL Intravenous Q12H   Continuous Infusions: . sodium chloride 500 mL (11/17/17 1912)   PRN Meds: sodium chloride, acetaminophen **OR** acetaminophen, albuterol, hydrALAZINE, ondansetron **OR** ondansetron (ZOFRAN) IV, polyethylene glycol   Vital Signs    Vitals:   11/19/17 1625 11/19/17 2002 11/20/17 0241 11/20/17 0733  BP: (!) 155/71 (!) 163/87 (!) 157/75 (!) 188/69  Pulse: 64 71 69 68  Resp: 18 18 18    Temp:  98 F (36.7 C) 98 F (36.7 C) 97.9 F (36.6 C)  TempSrc:  Oral Oral Oral  SpO2: 99% 98% 98% 100%  Weight:   68.5 kg   Height:        Intake/Output Summary (Last 24 hours) at 11/20/2017 1014 Last data filed at 11/20/2017 0942 Gross per 24 hour  Intake 360 ml  Output 1200 ml  Net -840 ml   Filed Weights   11/18/17 0500 11/19/17 0413 11/20/17 0241  Weight: 66.2 kg 67.7 kg 68.5 kg    Physical Exam   GEN: Well nourished, well developed, in no acute distress.  HEENT: Grossly normal.  Neck: Supple, no JVD, carotid bruits, or masses. Cardiac: RRR, 2/6 systolic murmur loudest at the upper sternal borders, no rubs, or gallops. No clubbing, cyanosis, edema.  Radials/DP/PT 2+ and equal bilaterally.  Respiratory:  Respirations regular and unlabored, clear to auscultation bilaterally. GI: Soft, nontender, nondistended, BS + x 4. MS: no deformity or  atrophy. Skin: warm and dry, no rash. Neuro:  Strength and sensation are intact. Psych: AAOx3.  Normal affect.  Labs    Chemistry Recent Labs  Lab 11/17/17 1445 11/18/17 0533 11/19/17 0433 11/20/17 0903  NA 141 142 141 139  K 2.8* 3.4* 3.8 3.9  CL 105 109 111 107  CO2 23 26 25 26   GLUCOSE 196* 110* 128* 108*  BUN 32* 23 22 15   CREATININE 1.39* 0.68 0.62 0.65  CALCIUM 8.6* 8.1* 8.5* 8.3*  PROT 7.0  --   --   --   ALBUMIN 3.7  --   --   --   AST 15  --   --   --   ALT 11  --   --   --   ALKPHOS 60  --   --   --   BILITOT 1.2  --   --   --   GFRNONAA 36* >60 >60 >60  GFRAA 42* >60 >60 >60  ANIONGAP 13 7 5 6      Hematology Recent Labs  Lab 11/17/17 1445 11/18/17 0533  WBC 9.7 6.6  RBC 4.27 4.04  HGB 13.1 12.2  HCT 37.3 35.3  MCV 87.4 87.4  MCH 30.6 30.2  MCHC 35.0 34.5  RDW 13.4 13.2  PLT 296 258    Cardiac Enzymes Recent Labs  Lab 11/17/17 1445 11/17/17  1857 11/18/17 0005  TROPONINI 0.03* 0.06* 0.09*      Radiology    No results found.  Telemetry    RSR, A POD, 11 beats of NSVT @ 05:55 this AM - Personally Reviewed  Cardiac Studies   2D Echocardiogram10.1.2019  Study Conclusions  - Left ventricle: The cavity size was normal. Wall thickness was increased in a pattern of severe LVH. The basal inferior wall is relatively spared. Systolic function was hyperdynamic. The estimated ejection fraction was in the range of 70% to 75%. Wall motion was normal; there were no regional wall motion abnormalities. Doppler parameters are consistent with abnormal left ventricular relaxation (grade 1 diastolic dysfunction). - Aortic valve: Transvalvular velocity was minimally increased. There was very mild stenosis versus dynamic LVOT obstruction. - Mitral valve: Calcified annulus. - Right ventricle: The cavity size was normal. Systolic function was normal.  Impressions:  - Hyperdynamic LVEF with severe LVH. Hypertrophic  cardiomyopathy and dynamic LVOT obstruction (HOCM) cannot be excluded.  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 Echo shows sev LVH - ? HCM.  Assessment & Plan    1.  Syncope/LVH: Admitted October 1 after becoming unresponsive at home times approximate 15 minutes.  CPR by family member.  Reportedly pulses.  Hypotensive on EMS arrival.  BUN and creatinine were elevated at 32 and 1.39 respectively on arrival and she was found to be hypokalemic.  Pacemaker interrogation showed normal device function with brief episodes of nonsustained VT, though the last episode was September 23 and she was not having any arrhythmias on the day of admission.  Echo October 1 showed hyperdynamic LV function with severe LVH and mild aortic stenosis versus dynamic LVOT obstruction.  Symptoms most likely secondary to dehydration in the setting of possible hypertrophic cardiomyopathy.  We have discontinued hydralazine and losartan in favor of titration of beta-blocker in addition of diltiazem.  Blood pressure elevated yesterday and I will titrate diltiazem further today.  She did have 11 beats of nonsustained VT on telemetry though she thinks she was sleeping at that time.  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 Synthia Innocent as an outpatient.  2.  Essential hypertension: Blood pressure elevated yesterday and this morning.  We will titrate the diltiazem further.  Continue beta-blocker.  Avoid afterload reducing agents.  3.  Elevated troponin: Demand ischemia in the setting of hypotension on admission.  Low risk stress test in 2018.  Echo without wall motion abnormalities this admission.  Continue aspirin,  beta-blocker, and statin therapy.  No ischemic evaluation plan at this time.  4.  Nonsustained ventricular tachycardia: 11 beats at 5:55 AM this morning.  Stat magnesium and potassium are within normal limits.  Normal EF by echo.  Continue beta-blocker.  As above, will have her follow-up with Dr. [name].  5.  Acute kidney injury: Resolved.  6.  Cerebrovascular disease: Moderate to severe bilateral ICA stenoses.  The setting of hypertension, certainly could have contributed to altered mental status on admission.  Will need outpatient carotid ultrasound and follow-up.  Signed, Nicolasa Ducking, NP  11/20/2017, 10:14 AM    For questions or updates, please contact   Please consult [email or web address] for contact info under Cardiology/STEMI.

## 2017-11-20 NOTE — Progress Notes (Signed)
Went over discharge instructions with the patient including medications and follow-up appointment. Discontinue peripheral IV and telemetry monitor. Waiting for nephew's patient for a ride, he said he probably be here before 1230. Will call volunteer for transport.

## 2017-11-24 ENCOUNTER — Telehealth: Payer: Self-pay | Admitting: Internal Medicine

## 2017-11-24 ENCOUNTER — Telehealth: Payer: Self-pay

## 2017-11-24 NOTE — Telephone Encounter (Signed)
-----   Message from Creig Hines, NP sent at 11/20/2017 10:18 AM EDT ----- S,  Could you pls arrange for Ms. Holloran to f/u with [facility] sooner rather than later (yes, I realized that this is my second 'sooner rather than later' request this AM).  She will be d/c'd today, has a ppm, and has also had nsvt.  T,  C

## 2017-11-24 NOTE — Telephone Encounter (Signed)
Flagged on EMMI report for not reading discharge papers and for having unfilled prescriptions.  Called and spoke with patient who mentioned she was able to fill all her medications and has since read over her discharge papers.  No questions or concerns currently.  I thanked her for her time and informed her she would receive one more automated call checking in during the next few days.

## 2017-11-24 NOTE — Telephone Encounter (Signed)
Per Ward Givens, NP pt needs pacer follow up appt. Asap.

## 2017-11-26 NOTE — Telephone Encounter (Signed)
Patient is scheduled 12/08/17 to see Dr [name]

## 2017-11-27 ENCOUNTER — Telehealth: Payer: Self-pay | Admitting: Family Medicine

## 2017-11-27 NOTE — Telephone Encounter (Signed)
CSW attempted to contact patient to discuss feelings of sadness/lost interest in things/lonliness.  Unable to leave a message because voice mail not set up.  Shannon Chen. Shannon Chen, MSW, Theresia Majors [phone]  11/27/2017 5:35 PM

## 2017-12-03 IMAGING — US US CAROTID DUPLEX BILAT
1 series · 13 of 24 positions shown · non-contrast
Comparison: None

CLINICAL DATA: 71-year-old female with a history of syncope.

Cardiovascular risk factors include known prior stroke/ TIA, tobacco
use
EXAM:
BILATERAL CAROTID DUPLEX ULTRASOUND
TECHNIQUE: Gray scale imaging, color Doppler and duplex ultrasound were
performed of bilateral carotid and vertebral arteries in the neck.

[Series 1: us carotid duplex bilat · 0.08mm/px · 13 of 81 slices shown]
[im 1/81]
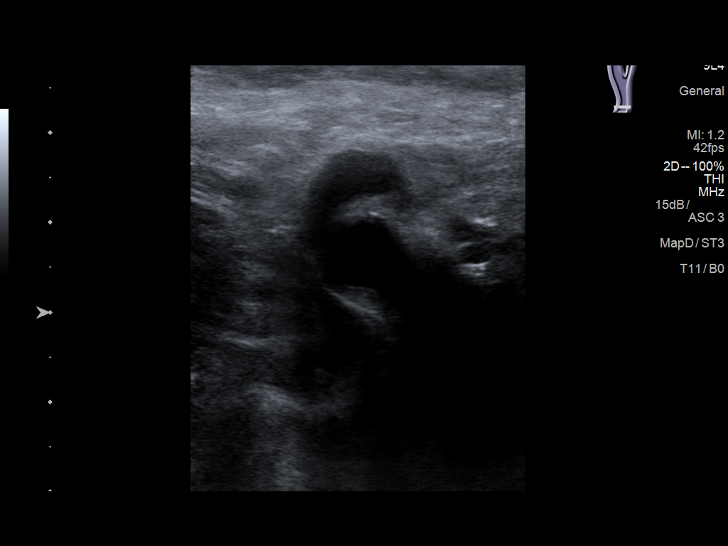
[im 7/81]
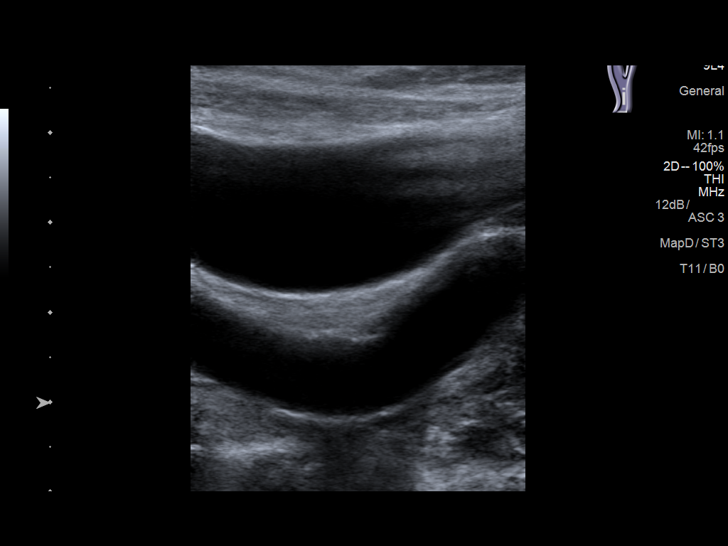
[im 14/81]
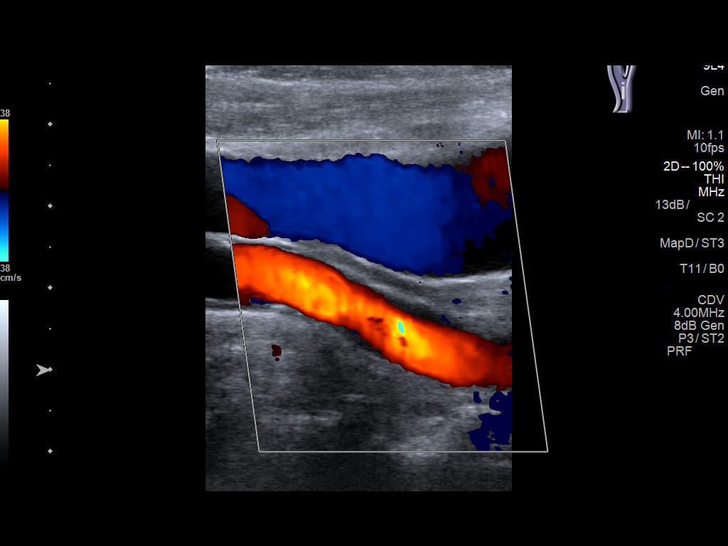
[im 21/81]
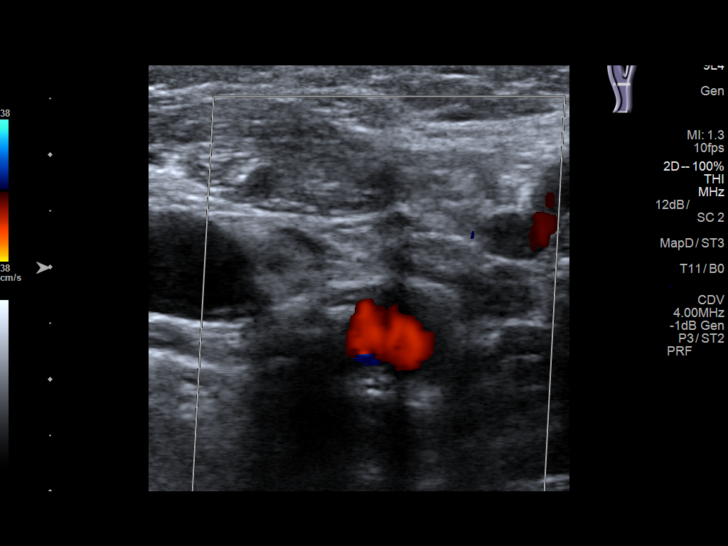
[im 28/81]
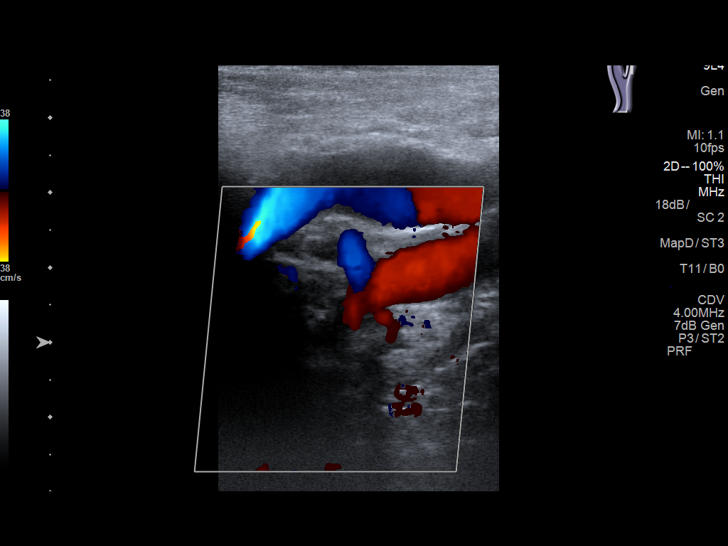
[im 35/81]
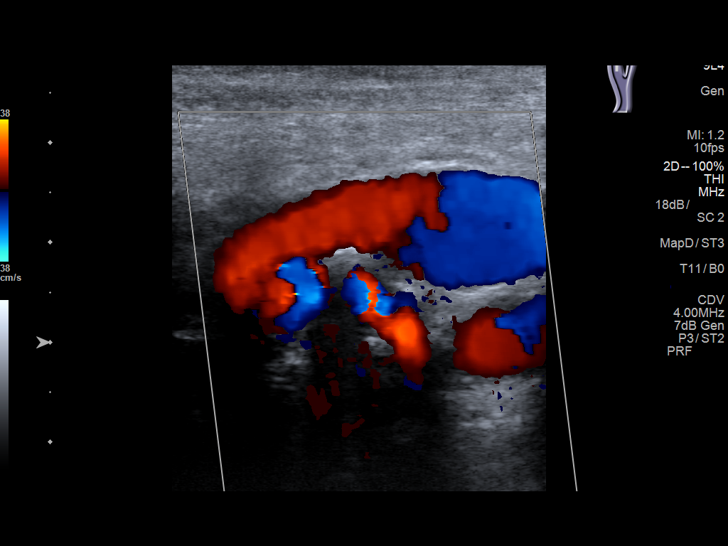
[im 42/81]
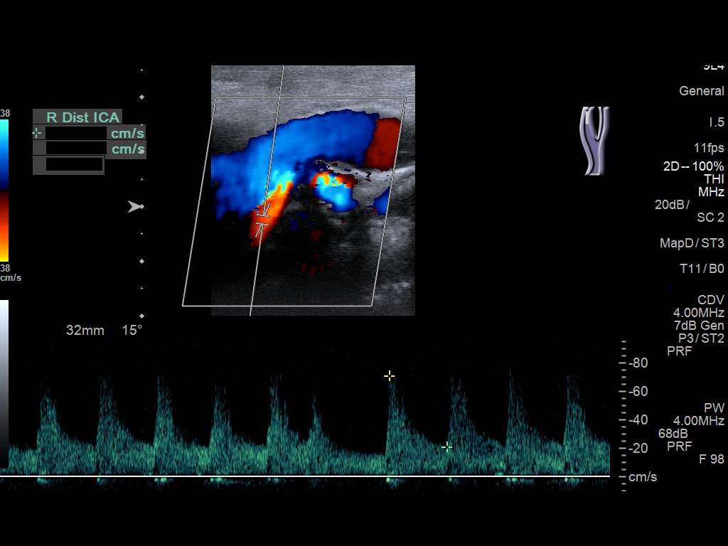
[im 46/81]
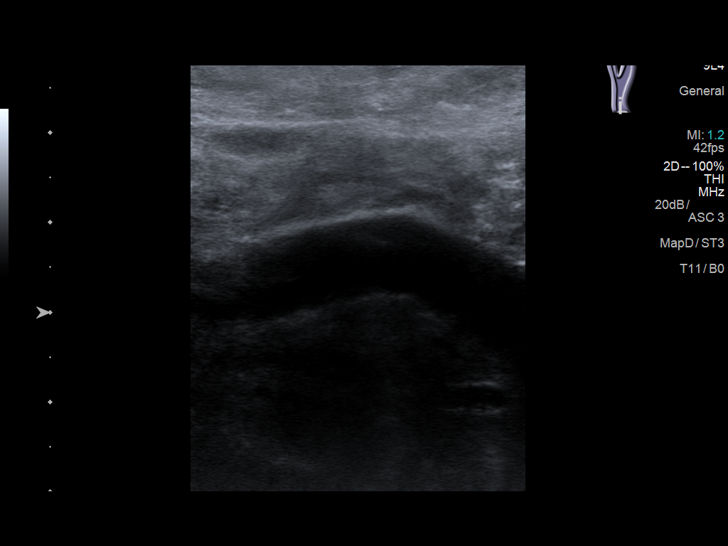
[im 53/81]
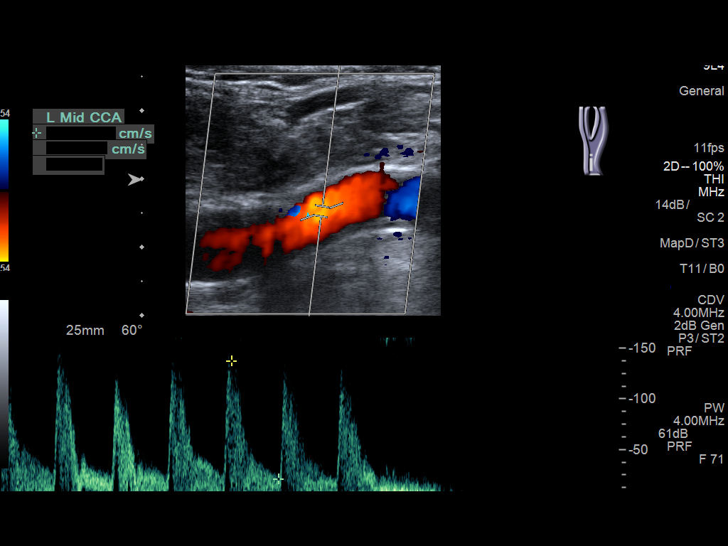
[im 60/81]
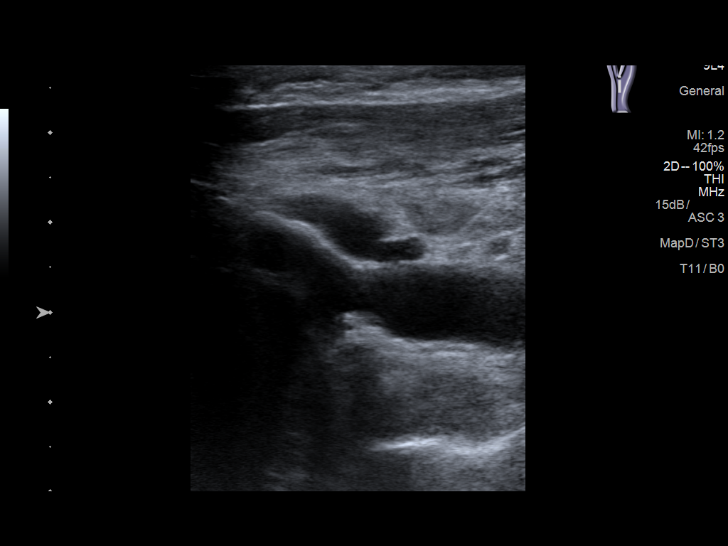
[im 67/81]
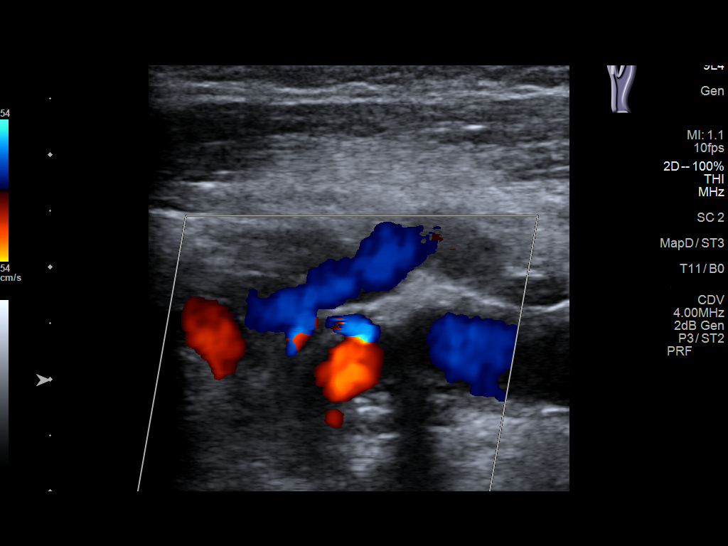
[im 74/81]
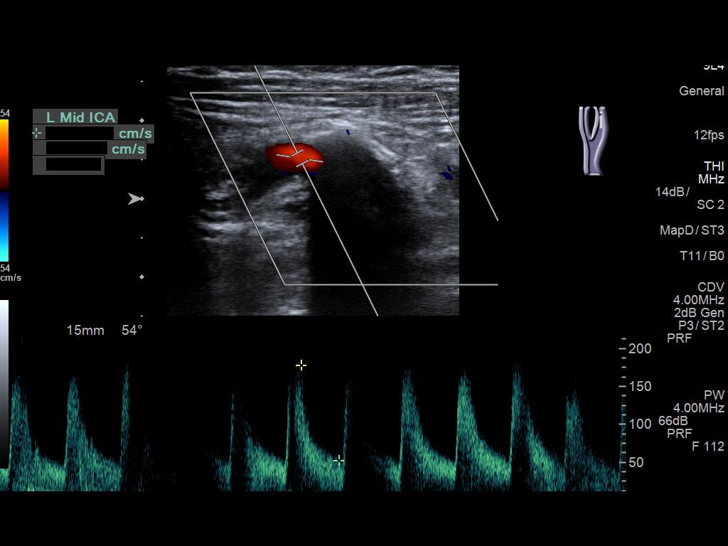
[im 81/81]
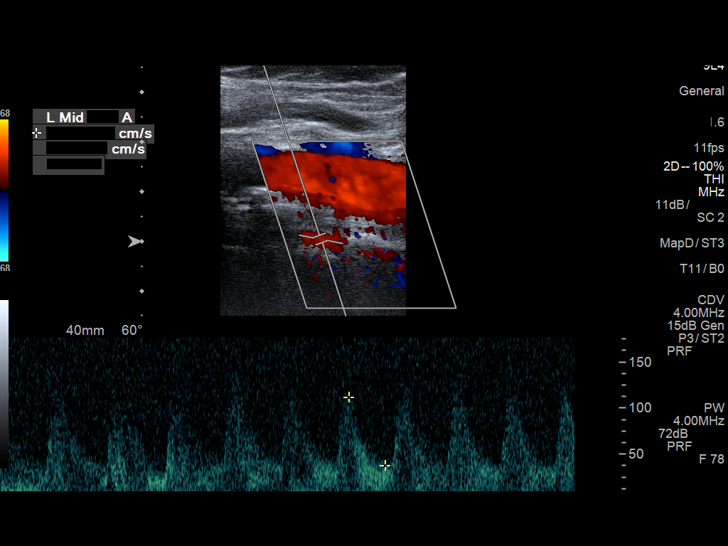

[13 of 24 positions shown; findings below may reference images not displayed]

FINDINGS: Criteria: Quantification of carotid stenosis is based on velocity
parameters that correlate the residual internal carotid diameter
with NASCET-based stenosis levels, using the diameter of the distal
internal carotid lumen as the denominator for stenosis measurement.

The following velocity measurements were obtained:

RIGHT

ICA:  Systolic 645 cm/sec, Diastolic 183 cm/sec

CCA:  147 cm/sec

SYSTOLIC ICA/CCA RATIO:

ECA:  100 cm/sec

LEFT

ICA:  Systolic 182 cm/sec, Diastolic 57 cm/sec

CCA:  137 cm/sec

SYSTOLIC ICA/CCA RATIO:

ECA:  100 cm/sec

Right Brachial SBP: Not acquired

Left Brachial SBP: Not acquired

RIGHT CAROTID ARTERY: Heterogeneous plaque of the distal common
carotid artery without significant calcifications. Intermediate
waveform maintained. Heterogeneous and partially calcified plaque at
the right carotid bifurcation. No significant lumen shadowing. Low
resistance waveform of the right ICA. Tortuosity

RIGHT VERTEBRAL ARTERY: Antegrade flow with low resistance waveform.

LEFT CAROTID ARTERY: No significant calcifications of the left
common carotid artery. Intermediate waveform maintained.
Heterogeneous and partially calcified plaque at the left carotid
bifurcation without significant lumen shadowing. Low resistance
waveform of the left ICA. Tortuosity

LEFT VERTEBRAL ARTERY:  Antegrade flow with low resistance waveform.
IMPRESSION: Right:

Heterogeneous and partially calcified plaque contributes to 70%- 99%
stenosis by established duplex criteria.

Left:

Heterogeneous and partially calcified plaque at the carotid
bifurcation, with discordant results regarding degree of stenosis by
established duplex criteria. Peak velocity suggests 50% -69%
stenosis, with the ICA/ CCA ratio suggesting a lesser degree of
stenosis. There is, however, calcifications contributing to
shadowing of the lumen, and if a more accurate assessment for degree
of stenosis is required, cerebral angiogram should be considered, or
as a second best test, CTA.

## 2017-12-07 NOTE — Progress Notes (Signed)
Patient Care Team: Gustavo Lah, MD as PCP - General (Family Medicine) Antonieta Iba, MD as PCP - Cardiology (Cardiology) Marinus Maw, MD as PCP - Electrophysiology (Cardiology)   HPI  Shannon Chen is a 72 y.o. female Seen in followup for Medtronic Para Hisian PM implanted 8/18 for symptomatic second-degree heart block.  Denies chest pain shortness of breath peripheral edema.  But the weight of the world is on her shoulders.  Where she has lived for the last 16 years is been close down because of bad ceilings rooms.  She is looking for a place to live.    LVEF 8/18 normal LV function Myoview 11/18 no ischemia  Records and Results Reviewed  Date Cr K Hgb  8/18  0.81 3.8 13.6  10/19  0.65 3.9      Past Medical History:  Diagnosis Date  . Arthritis   . AV block, Mobitz II    a. 09/2016 s/p MDT [id number] Azure XT DR MRI DC PPM  . Carotid artery disease ([name])    a. 09/2016 Carotid U/S: RICA 70-99%, LICA 50-69%;  b. 09/2016 CTA Neck: RICA 60, RCCA 30, LICA 70%, L Vert 60, L Basilar 70.  . Essential hypertension   . Hyperlipidemia   . Systolic murmur    09/2016 Echo: EF 60-65%, no rwma, Gr1 DD, Ca2+ MV annulus.    Past Surgical History:  Procedure Laterality Date  . ABDOMINAL HYSTERECTOMY    . PACEMAKER IMPLANT N/A 10/08/2016   Procedure: Pacemaker Implant;  Surgeon: Marinus Maw, MD;  Location: [facility] INVASIVE CV LAB;  Service: Cardiovascular;  Laterality: N/A;    Current Outpatient Medications  Medication Sig Dispense Refill  . carvedilol (COREG) 25 MG tablet Take 1 tablet (25 mg total) by mouth 2 (two) times daily. 60 tablet 0  . diltiazem (CARDIZEM CD) 180 MG 24 hr capsule Take 1 capsule (180 mg total) by mouth daily. 30 capsule 0  . rosuvastatin (CRESTOR) 40 MG tablet Take 1 tablet (40 mg total) by mouth daily at 6 PM. 30 tablet 5   No current facility-administered medications for this visit.   What we have her increase  No Known  Allergies    Review of Systems negative except from HPI and PMH  Physical Exam BP (!) 198/124 (BP Location: Left Arm, Patient Position: Sitting, Cuff Size: Normal)   Pulse 96   Ht 5' 5.5" (1.664 m)   Wt 146 lb 8 oz (66.5 kg)   BMI 24.01 kg/m  Well developed and nourished in no acute distress HENT normal Neck supple with JVP-flat Clear Regular rate and rhythm, no murmurs or gallops Abd-soft with active BS No Clubbing cyanosis edema Skin-warm and dry A & Oriented  Grossly normal sensory and motor function   Personally reviewed sinus with RBBB  Assessment and  Plan  Mobitz 2 AVB  Syncope  Medtronic ParaHisian Pacemaker  Hypertension   Stress    BP poorly controlled.  She has not taken her medications today.  She is under a great deal of stress related to her living situation.  If her blood pressure remains elevated, I have encouraged her to follow-up with the clinic for augmented therapies.  Device function is normal.  We spent more than 50% of our >25 min visit in face to face counseling regarding the above           Current medicines are reviewed at length with the patient today .  The  patient does not  have concerns regarding medicines.

## 2017-12-08 ENCOUNTER — Ambulatory Visit (INDEPENDENT_AMBULATORY_CARE_PROVIDER_SITE_OTHER): Payer: Medicare Other | Admitting: Internal Medicine

## 2017-12-08 ENCOUNTER — Encounter: Payer: Self-pay | Admitting: Internal Medicine

## 2017-12-08 VITALS — BP 198/124 | HR 96 | Ht 65.5 in | Wt 146.5 lb

## 2017-12-08 DIAGNOSIS — I441 Atrioventricular block, second degree: Secondary | ICD-10-CM | POA: Diagnosis not present

## 2017-12-08 DIAGNOSIS — Z950 Presence of cardiac pacemaker: Secondary | ICD-10-CM | POA: Diagnosis not present

## 2017-12-08 LAB — CUP PACEART INCLINIC DEVICE CHECK
Brady Statistic AP VP Percent: 5.32 %
Brady Statistic AP VS Percent: 1.11 %
Brady Statistic AS VP Percent: 4.46 %
Brady Statistic RA Percent Paced: 6.43 %
Date Time Interrogation Session: 20191022133836
Implantable Lead Implant Date: 20180822
Implantable Lead Location: 753859
Implantable Lead Model: 5076
Lead Channel Impedance Value: 342 Ohm
Lead Channel Impedance Value: 475 Ohm
Lead Channel Impedance Value: 494 Ohm
Lead Channel Pacing Threshold Amplitude: 0.5 V
Lead Channel Pacing Threshold Pulse Width: 0.4 ms
Lead Channel Sensing Intrinsic Amplitude: 6.375 mV
Lead Channel Setting Sensing Sensitivity: 2 mV
MDC IDC LEAD IMPLANT DT: 20180822
MDC IDC LEAD LOCATION: 753860
MDC IDC MSMT BATTERY REMAINING LONGEVITY: 170 mo
MDC IDC MSMT BATTERY VOLTAGE: 3.1 V
MDC IDC MSMT LEADCHNL RA IMPEDANCE VALUE: 627 Ohm
MDC IDC MSMT LEADCHNL RV PACING THRESHOLD AMPLITUDE: 1 V
MDC IDC MSMT LEADCHNL RV PACING THRESHOLD PULSEWIDTH: 0.4 ms
MDC IDC MSMT LEADCHNL RV SENSING INTR AMPL: 8.25 mV
MDC IDC PG IMPLANT DT: 20180822
MDC IDC SET LEADCHNL RA PACING AMPLITUDE: 2 V
MDC IDC SET LEADCHNL RV PACING AMPLITUDE: 2.5 V
MDC IDC SET LEADCHNL RV PACING PULSEWIDTH: 0.4 ms
MDC IDC STAT BRADY AS VS PERCENT: 89.11 %
MDC IDC STAT BRADY RV PERCENT PACED: 9.78 %

## 2017-12-08 NOTE — Patient Instructions (Addendum)
Medication Instructions:  - Your physician recommends that you continue on your current medications as directed. Please refer to the Current Medication list given to you today.  If you need a refill on your cardiac medications before your next appointment, please call your pharmacy.   Lab work: - none ordered  If you have labs (blood work) drawn today and your tests are completely normal, you will receive your results only by: Marland Kitchen MyChart Message (if you have MyChart) OR . A paper copy in the mail If you have any lab test that is abnormal or we need to change your treatment, we will call you to review the results.  Testing/Procedures: - none ordered  Follow-Up: At [facility], you and your health needs are our priority.  As part of our continuing mission to provide you with exceptional heart care, we have created designated Provider Care Teams.  These Care Teams include your primary Cardiologist (physician) and Advanced Practice Providers (APPs -  Physician Assistants and Nurse Practitioners) who all work together to provide you with the care you need, when you need it. . You will need a follow up appointment in 10 months (August 2020) with Dr. [name].  Please call our office 2 months in advance to schedule this appointment.   Remote monitoring is used to monitor your Pacemaker of ICD from home. This monitoring reduces the number of office visits required to check your device to one time per year. It allows [place] to keep an eye on the functioning of your device to ensure it is working properly. You are scheduled for a device check from home on 03/09/18. You may send your transmission at any time that day. If you have a wireless device, the transmission will be sent automatically. After your physician reviews your transmission, you will receive a postcard with your next transmission date.   Any Other Special Instructions Will Be Listed Below (If Applicable) - Please send a manual transmission with  your pacemaker home monitor. Instructions are attached.

## 2017-12-16 ENCOUNTER — Ambulatory Visit: Payer: Self-pay | Admitting: Internal Medicine

## 2017-12-23 ENCOUNTER — Ambulatory Visit: Payer: Self-pay | Admitting: Nurse Practitioner

## 2017-12-23 ENCOUNTER — Encounter: Payer: Self-pay | Admitting: Nurse Practitioner

## 2017-12-23 NOTE — Progress Notes (Deleted)
    Office Visit    Patient Name: Shannon Chen Date of Encounter: 12/23/2017  Primary Care Provider:  Gustavo Lah, MD Primary Cardiologist:  Julien Nordmann, MD  Chief Complaint    ***  Past Medical History    Past Medical History:  Diagnosis Date  . Arthritis   . AV block, Mobitz II    a. 09/2016 s/p MDT [id number] Azure XT DR MRI DC PPM  . Carotid artery disease ([name])    a. 09/2016 Carotid U/S: RICA 70-99%, LICA 50-69%;  b. 09/2016 CTA Neck: RICA 60, RCCA 30, LICA 70%, L Vert 60, L Basilar 70.  . Essential hypertension   . Hyperlipidemia   . LVH (left ventricular hypertrophy)    a. 09/2016 Echo: EF 60-65%, no rwma, Gr1 DD, Ca2+ MV annulus; b. 11/2017 Echo: EF 70-75%, no rwma, Gr1 DD, mild AS vs dynamic LVOT obs. Nl RV fxn. Hyperdynamic LVEF w/ sev LVH.   Past Surgical History:  Procedure Laterality Date  . ABDOMINAL HYSTERECTOMY    . PACEMAKER IMPLANT N/A 10/08/2016   Procedure: Pacemaker Implant;  Surgeon: Marinus Maw, MD;  Location: [facility] INVASIVE CV LAB;  Service: Cardiovascular;  Laterality: N/A;    Allergies  No Known Allergies  History of Present Illness    ***  Home Medications    Prior to Admission medications   Medication Sig Start Date End Date Taking? Authorizing Provider  carvedilol (COREG) 25 MG tablet Take 1 tablet (25 mg total) by mouth 2 (two) times daily. 11/20/17   Katha Hamming, MD  diltiazem (CARDIZEM CD) 180 MG 24 hr capsule Take 1 capsule (180 mg total) by mouth daily. 11/20/17   Katha Hamming, MD  rosuvastatin (CRESTOR) 40 MG tablet Take 1 tablet (40 mg total) by mouth daily at 6 PM. 10/09/16   Abelino Derrick, PA-C    Review of Systems    ***.  All other systems reviewed and are otherwise negative except as noted above.  Physical Exam    VS:  There were no vitals taken for this visit. , BMI There is no height or weight on file to calculate BMI. GEN: Well nourished, well developed, in no acute distress. HEENT: normal. Neck:  Supple, no JVD, carotid bruits, or masses. Cardiac: RRR, no murmurs, rubs, or gallops. No clubbing, cyanosis, edema.  Radials/DP/PT 2+ and equal bilaterally.  Respiratory:  Respirations regular and unlabored, clear to auscultation bilaterally. GI: Soft, nontender, nondistended, BS + x 4. MS: no deformity or atrophy. Skin: warm and dry, no rash. Neuro:  Strength and sensation are intact. Psych: Normal affect.  Accessory Clinical Findings    ECG personally reviewed by me today - *** - no acute changes.  Assessment & Plan    1.  ***   Nicolasa Ducking, NP 12/23/2017, 9:36 AM

## 2017-12-24 ENCOUNTER — Encounter: Payer: Self-pay | Admitting: Nurse Practitioner

## 2018-03-12 ENCOUNTER — Telehealth: Payer: Self-pay

## 2018-03-12 NOTE — Telephone Encounter (Signed)
Left message for patient to remind of missed remote transmission.  

## 2018-03-15 ENCOUNTER — Encounter: Payer: Self-pay | Admitting: Cardiology

## 2018-04-24 ENCOUNTER — Emergency Department: Payer: Medicare Other

## 2018-04-24 ENCOUNTER — Other Ambulatory Visit: Payer: Self-pay

## 2018-04-24 ENCOUNTER — Observation Stay
Admission: EM | Admit: 2018-04-24 | Discharge: 2018-04-26 | Disposition: A | Discharge status: Home / Self Care | Payer: Medicare Other | Attending: Internal Medicine | Admitting: Internal Medicine

## 2018-04-24 DIAGNOSIS — E860 Dehydration: Secondary | ICD-10-CM

## 2018-04-24 DIAGNOSIS — I6523 Occlusion and stenosis of bilateral carotid arteries: Secondary | ICD-10-CM | POA: Insufficient documentation

## 2018-04-24 DIAGNOSIS — E876 Hypokalemia: Secondary | ICD-10-CM | POA: Diagnosis not present

## 2018-04-24 DIAGNOSIS — I4510 Unspecified right bundle-branch block: Secondary | ICD-10-CM | POA: Insufficient documentation

## 2018-04-24 DIAGNOSIS — Z950 Presence of cardiac pacemaker: Secondary | ICD-10-CM | POA: Insufficient documentation

## 2018-04-24 DIAGNOSIS — R55 Syncope and collapse: Secondary | ICD-10-CM | POA: Diagnosis not present

## 2018-04-24 DIAGNOSIS — R630 Anorexia: Secondary | ICD-10-CM | POA: Diagnosis not present

## 2018-04-24 DIAGNOSIS — Z8674 Personal history of sudden cardiac arrest: Secondary | ICD-10-CM | POA: Diagnosis not present

## 2018-04-24 DIAGNOSIS — Z8249 Family history of ischemic heart disease and other diseases of the circulatory system: Secondary | ICD-10-CM | POA: Diagnosis not present

## 2018-04-24 DIAGNOSIS — Z7982 Long term (current) use of aspirin: Secondary | ICD-10-CM | POA: Diagnosis not present

## 2018-04-24 DIAGNOSIS — R262 Difficulty in walking, not elsewhere classified: Secondary | ICD-10-CM | POA: Insufficient documentation

## 2018-04-24 DIAGNOSIS — I441 Atrioventricular block, second degree: Secondary | ICD-10-CM | POA: Insufficient documentation

## 2018-04-24 DIAGNOSIS — I119 Hypertensive heart disease without heart failure: Secondary | ICD-10-CM | POA: Diagnosis not present

## 2018-04-24 DIAGNOSIS — Z87891 Personal history of nicotine dependence: Secondary | ICD-10-CM | POA: Insufficient documentation

## 2018-04-24 DIAGNOSIS — M1990 Unspecified osteoarthritis, unspecified site: Secondary | ICD-10-CM | POA: Diagnosis not present

## 2018-04-24 DIAGNOSIS — Z7901 Long term (current) use of anticoagulants: Secondary | ICD-10-CM | POA: Diagnosis not present

## 2018-04-24 DIAGNOSIS — Z79899 Other long term (current) drug therapy: Secondary | ICD-10-CM | POA: Insufficient documentation

## 2018-04-24 DIAGNOSIS — E785 Hyperlipidemia, unspecified: Secondary | ICD-10-CM | POA: Diagnosis not present

## 2018-04-24 LAB — URINALYSIS, COMPLETE (UACMP) WITH MICROSCOPIC
Bilirubin Urine: NEGATIVE
GLUCOSE, UA: NEGATIVE mg/dL
Hgb urine dipstick: NEGATIVE
Ketones, ur: 5 mg/dL — AB
Leukocytes,Ua: NEGATIVE
Nitrite: NEGATIVE
Protein, ur: 30 mg/dL — AB
SPECIFIC GRAVITY, URINE: 1.027 (ref 1.005–1.030)
pH: 5 (ref 5.0–8.0)

## 2018-04-24 LAB — URINE DRUG SCREEN, QUALITATIVE (ARMC ONLY)
AMPHETAMINES, UR SCREEN: NOT DETECTED
Barbiturates, Ur Screen: NOT DETECTED
Benzodiazepine, Ur Scrn: NOT DETECTED
Cannabinoid 50 Ng, Ur ~~LOC~~: NOT DETECTED
Cocaine Metabolite,Ur ~~LOC~~: NOT DETECTED
MDMA (ECSTASY) UR SCREEN: NOT DETECTED
Methadone Scn, Ur: NOT DETECTED
Opiate, Ur Screen: NOT DETECTED
PHENCYCLIDINE (PCP) UR S: NOT DETECTED
TRICYCLIC, UR SCREEN: NOT DETECTED

## 2018-04-24 LAB — CBC WITH DIFFERENTIAL/PLATELET
Abs Immature Granulocytes: 0.02 10*3/uL (ref 0.00–0.07)
BASOS ABS: 0 10*3/uL (ref 0.0–0.1)
BASOS PCT: 0 %
EOS ABS: 0.1 10*3/uL (ref 0.0–0.5)
EOS PCT: 1 %
HCT: 39.8 % (ref 36.0–46.0)
Hemoglobin: 13.1 g/dL (ref 12.0–15.0)
Immature Granulocytes: 0 %
Lymphocytes Relative: 63 %
Lymphs Abs: 6.1 10*3/uL — ABNORMAL HIGH (ref 0.7–4.0)
MCH: 28.9 pg (ref 26.0–34.0)
MCHC: 32.9 g/dL (ref 30.0–36.0)
MCV: 87.9 fL (ref 80.0–100.0)
Monocytes Absolute: 0.6 10*3/uL (ref 0.1–1.0)
Monocytes Relative: 7 %
NEUTROS PCT: 29 %
NRBC: 0 % (ref 0.0–0.2)
Neutro Abs: 2.8 10*3/uL (ref 1.7–7.7)
PLATELETS: 309 10*3/uL (ref 150–400)
RBC: 4.53 MIL/uL (ref 3.87–5.11)
RDW: 12.5 % (ref 11.5–15.5)
WBC: 9.7 10*3/uL (ref 4.0–10.5)

## 2018-04-24 LAB — ETHANOL

## 2018-04-24 LAB — BASIC METABOLIC PANEL
Anion gap: 13 (ref 5–15)
BUN: 24 mg/dL — ABNORMAL HIGH (ref 8–23)
CALCIUM: 8.3 mg/dL — AB (ref 8.9–10.3)
CHLORIDE: 107 mmol/L (ref 98–111)
CO2: 19 mmol/L — AB (ref 22–32)
Creatinine, Ser: 0.92 mg/dL (ref 0.44–1.00)
GFR calc Af Amer: >60 mL/min (ref >60)
Glucose, Bld: 198 mg/dL — ABNORMAL HIGH (ref 70–99)
Potassium: 3.1 mmol/L — ABNORMAL LOW (ref 3.5–5.1)
SODIUM: 139 mmol/L (ref 135–145)

## 2018-04-24 LAB — MAGNESIUM: MAGNESIUM: 2 mg/dL (ref 1.7–2.4)

## 2018-04-24 LAB — TSH: TSH: 1.229 u[IU]/mL (ref 0.350–4.500)

## 2018-04-24 LAB — TROPONIN I: Troponin I: <0.03 ng/mL (ref <0.03)

## 2018-04-24 MED ORDER — SENNOSIDES-DOCUSATE SODIUM 8.6-50 MG PO TABS: 1.0000 | ORAL_TABLET | Freq: Every evening | ORAL | Status: DC | PRN

## 2018-04-24 MED ORDER — DILTIAZEM HCL ER COATED BEADS 180 MG PO CP24
180.0000 mg | ORAL_CAPSULE | Freq: Every day | ORAL | Status: DC
  Administered 2018-04-24 – 2018-04-25 (×2): 180 mg via ORAL
  Filled 2018-04-24 (×2): qty 1

## 2018-04-24 MED ORDER — ACETAMINOPHEN 325 MG PO TABS
650.0000 mg | ORAL_TABLET | Freq: Four times a day (QID) | ORAL | Status: DC | PRN
  Administered 2018-04-26: 650 mg via ORAL
  Filled 2018-04-24: qty 2

## 2018-04-24 MED ORDER — CARVEDILOL 25 MG PO TABS
25.0000 mg | ORAL_TABLET | Freq: Two times a day (BID) | ORAL | Status: DC
  Administered 2018-04-24 – 2018-04-25 (×2): 25 mg via ORAL
  Filled 2018-04-24 (×2): qty 1

## 2018-04-24 MED ORDER — ONDANSETRON HCL 4 MG PO TABS: 4.0000 mg | ORAL_TABLET | Freq: Four times a day (QID) | ORAL | Status: DC | PRN

## 2018-04-24 MED ORDER — ACETAMINOPHEN 650 MG RE SUPP: 650.0000 mg | Freq: Four times a day (QID) | RECTAL | Status: DC | PRN

## 2018-04-24 MED ORDER — POTASSIUM CHLORIDE 20 MEQ PO PACK
40.0000 meq | PACK | Freq: Once | ORAL | Status: AC
  Administered 2018-04-24: 40 meq via ORAL
  Filled 2018-04-24: qty 2

## 2018-04-24 MED ORDER — SODIUM CHLORIDE 0.9 % IV SOLN
INTRAVENOUS | Status: DC
  Administered 2018-04-24 – 2018-04-25 (×2): via INTRAVENOUS

## 2018-04-24 MED ORDER — ACETAMINOPHEN 500 MG PO TABS
1000.0000 mg | ORAL_TABLET | Freq: Once | ORAL | Status: AC
  Administered 2018-04-24: 1000 mg via ORAL
  Filled 2018-04-24: qty 2

## 2018-04-24 MED ORDER — ROSUVASTATIN CALCIUM 10 MG PO TABS: 40.0000 mg | ORAL_TABLET | Freq: Every day | ORAL | Status: DC

## 2018-04-24 MED ORDER — SODIUM CHLORIDE 0.9% FLUSH
3.0000 mL | Freq: Two times a day (BID) | INTRAVENOUS | Status: DC
  Administered 2018-04-24 – 2018-04-25 (×3): 3 mL via INTRAVENOUS

## 2018-04-24 MED ORDER — SODIUM CHLORIDE 0.9 % IV BOLUS
1000.0000 mL | Freq: Once | INTRAVENOUS | Status: AC
  Administered 2018-04-24: 1000 mL via INTRAVENOUS

## 2018-04-24 MED ORDER — ENOXAPARIN SODIUM 40 MG/0.4ML ~~LOC~~ SOLN
40.0000 mg | SUBCUTANEOUS | Status: DC
  Administered 2018-04-24 – 2018-04-25 (×2): 40 mg via SUBCUTANEOUS
  Filled 2018-04-24 (×2): qty 0.4

## 2018-04-24 MED ORDER — ONDANSETRON HCL 4 MG/2ML IJ SOLN: 4.0000 mg | Freq: Four times a day (QID) | INTRAMUSCULAR | Status: DC | PRN

## 2018-04-24 NOTE — ED Notes (Signed)
Pt c/o upper lip/nose pain and there is some nasal/upper lip swelling.

## 2018-04-24 NOTE — ED Notes (Signed)
Admitting at bedside 

## 2018-04-24 NOTE — ED Notes (Signed)
Both numbers in demographics attempted to be called at this time without an answer.

## 2018-04-24 NOTE — H&P (Addendum)
[facility] [place] at [facility]   PATIENT NAME: Shannon Chen    MR#:  [id number]  DATE OF BIRTH:  1946-02-05  DATE OF ADMISSION:  04/24/2018  PRIMARY CARE PHYSICIAN: Gustavo Lah, MD   REQUESTING/REFERRING PHYSICIAN:   CHIEF COMPLAINT:   Chief Complaint  Patient presents with  . Fall    HISTORY OF PRESENT ILLNESS: Amoy Farver  is a 73 y.o. female with a known history of cardiac arrest, carotid artery disease, hypertension, hyperlipidemia, left ventricular hypertrophy, arthritis was brought to the emergency room for passing out.  Patient had prior episodes of syncope and was evaluated by [facility] health cardiology.  She was checked in the emergency room and found to be dehydrated and IV fluids were started.  Pacer download does not show any arrhythmia.  Twelve-lead EKG normal sinus rhythm with RBBB.  No complaints of any chest pain.  Pending CT head.  Hospitalist service was consulted.  PAST MEDICAL HISTORY:   Past Medical History:  Diagnosis Date  . Arthritis   . AV block, Mobitz II    a. 09/2016 s/p MDT [id number] Azure XT DR MRI DC PPM  . Carotid artery disease ([name])    a. 09/2016 Carotid U/S: RICA 70-99%, LICA 50-69%;  b. 09/2016 CTA Neck: RICA 60, RCCA 30, LICA 70%, L Vert 60, L Basilar 70.  . Essential hypertension   . Hyperlipidemia   . LVH (left ventricular hypertrophy)    a. 09/2016 Echo: EF 60-65%, no rwma, Gr1 DD, Ca2+ MV annulus; b. 11/2017 Echo: EF 70-75%, no rwma, Gr1 DD, mild AS vs dynamic LVOT obs. Nl RV fxn. Hyperdynamic LVEF w/ sev LVH.    PAST SURGICAL HISTORY:  Past Surgical History:  Procedure Laterality Date  . ABDOMINAL HYSTERECTOMY    . PACEMAKER IMPLANT N/A 10/08/2016   Procedure: Pacemaker Implant;  Surgeon: Marinus Maw, MD;  Location: [facility] INVASIVE CV LAB;  Service: Cardiovascular;  Laterality: N/A;    SOCIAL HISTORY:  Social History   Tobacco Use  . Smoking status: Former Smoker    Packs/day: 1.00    Years: 30.00     Pack years: 30.00    Types: Cigarettes  . Smokeless tobacco: Never Used  Substance Use Topics  . Alcohol use: No    FAMILY HISTORY:  Family History  Problem Relation Age of Onset  . Diabetes Mother   . CAD Father   Deceased  DRUG ALLERGIES: No Known Allergies  REVIEW OF SYSTEMS:   CONSTITUTIONAL: No fever,has fatigue and weakness.  EYES: No blurred or double vision.  EARS, NOSE, AND THROAT: No tinnitus or ear pain.  RESPIRATORY: No cough, shortness of breath, wheezing or hemoptysis.  CARDIOVASCULAR: No chest pain, orthopnea, edema.  GASTROINTESTINAL: No nausea, vomiting, diarrhea or abdominal pain.  GENITOURINARY: No dysuria, hematuria.  ENDOCRINE: No polyuria, nocturia,  HEMATOLOGY: No anemia, easy bruising or bleeding SKIN: No rash or lesion. MUSCULOSKELETAL: No joint pain or arthritis.   NEUROLOGIC: No tingling, numbness, weakness.  PSYCHIATRY: No anxiety or depression.   MEDICATIONS AT HOME:  Prior to Admission medications   Medication Sig Start Date End Date Taking? Authorizing Provider  carvedilol (COREG) 25 MG tablet Take 1 tablet (25 mg total) by mouth 2 (two) times daily. 11/20/17   Katha Hamming, MD  diltiazem (CARDIZEM CD) 180 MG 24 hr capsule Take 1 capsule (180 mg total) by mouth daily. 11/20/17   Katha Hamming, MD  rosuvastatin (CRESTOR) 40 MG tablet Take 1 tablet (40 mg total)  by mouth daily at 6 PM. 10/09/16   Kilroy, Eda Paschal, PA-C      PHYSICAL EXAMINATION:   VITAL SIGNS: Blood pressure 138/63, pulse 80, temperature 97.6 F (36.4 C), temperature source Oral, resp. rate 16, SpO2 95 %.  GENERAL:  73 y.o.-year-old patient lying in the bed with no acute distress.  EYES: Pupils equal, round, reactive to light and accommodation. No scleral icterus. Extraocular muscles intact.  HEENT: Head atraumatic, normocephalic. Oropharynx and nasopharynx clear.  NECK:  Supple, no jugular venous distention. No thyroid enlargement, no tenderness.  LUNGS: Normal  breath sounds bilaterally, no wheezing, rales,rhonchi or crepitation. No use of accessory muscles of respiration.  CARDIOVASCULAR: S1, S2 normal. No murmurs, rubs, or gallops.  ABDOMEN: Soft, nontender, nondistended. Bowel sounds present. No organomegaly or mass.  EXTREMITIES: No pedal edema, cyanosis, or clubbing.  NEUROLOGIC: Cranial nerves II through XII are intact. Muscle strength 5/5 in all extremities. Sensation intact. Gait not checked.  PSYCHIATRIC: The patient is alert and oriented x 3.  SKIN: No obvious rash, lesion, or ulcer.   LABORATORY PANEL:   CBC Recent Labs  Lab 04/24/18 1526  WBC 9.7  HGB 13.1  HCT 39.8  PLT 309  MCV 87.9  MCH 28.9  MCHC 32.9  RDW 12.5  LYMPHSABS 6.1*  MONOABS 0.6  EOSABS 0.1  BASOSABS 0.0   ------------------------------------------------------------------------------------------------------------------  Chemistries  Recent Labs  Lab 04/24/18 1526 04/24/18 1626  NA 139  --   K 3.1*  --   CL 107  --   CO2 19*  --   GLUCOSE 198*  --   BUN 24*  --   CREATININE 0.92  --   CALCIUM 8.3*  --   MG  --  2.0   ------------------------------------------------------------------------------------------------------------------ CrCl cannot be calculated (Unknown ideal weight.). ------------------------------------------------------------------------------------------------------------------ No results for input(s): TSH, T4TOTAL, T3FREE, THYROIDAB in the last 72 hours.  Invalid input(s): FREET3   Coagulation profile No results for input(s): INR, PROTIME in the last 168 hours. ------------------------------------------------------------------------------------------------------------------- No results for input(s): DDIMER in the last 72 hours. -------------------------------------------------------------------------------------------------------------------  Cardiac Enzymes Recent Labs  Lab 04/24/18 1526  TROPONINI <0.03    ------------------------------------------------------------------------------------------------------------------ Invalid input(s): POCBNP  ---------------------------------------------------------------------------------------------------------------  Urinalysis    Component Value Date/Time   COLORURINE AMBER (A) 04/24/2018 1600   APPEARANCEUR CLEAR (A) 04/24/2018 1600   LABSPEC 1.027 04/24/2018 1600   PHURINE 5.0 04/24/2018 1600   GLUCOSEU NEGATIVE 04/24/2018 1600   HGBUR NEGATIVE 04/24/2018 1600   BILIRUBINUR NEGATIVE 04/24/2018 1600   KETONESUR 5 (A) 04/24/2018 1600   PROTEINUR 30 (A) 04/24/2018 1600   NITRITE NEGATIVE 04/24/2018 1600   LEUKOCYTESUR NEGATIVE 04/24/2018 1600     RADIOLOGY: Dg Chest Portable 1 View  Result Date: 04/24/2018 CLINICAL DATA:  73 year old with syncope. EXAM: PORTABLE CHEST 1 VIEW COMPARISON:  11/17/2017 FINDINGS: Stable appearance of the left dual chamber cardiac pacemaker. Heart size is normal. Atherosclerotic calcifications at the aortic arch. Lungs are clear. Negative for pneumothorax. Bone structures are intact. Degenerative changes at the right [facility] joint. IMPRESSION: No active disease. Electronically Signed   By: Richarda Overlie M.D.   On: 04/24/2018 16:09    EKG: Orders placed or performed during the hospital encounter of 04/24/18  . EKG 12-Lead  . EKG 12-Lead  . Repeat EKG  . Repeat EKG   CT head :  1. No acute intracranial abnormality. 2. Stable advanced atrophy and white matter disease. 3. 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 4. Ex  vacuo dilation of the lateral ventricles, left greater than right.  IMPRESSION AND PLAN: 73 year old female patient with a known history of cardiac arrest, carotid artery disease, hypertension, hyperlipidemia, left ventricular hypertrophy, arthritis was brought to the emergency room for passing out.  -Syncope Appears to be secondary to dehydration and  orthostasis Hydration with normal saline Pacer download does not show any arrhythmia Twelve-lead EKG NSR with no ST segment abnormalities Admit under telemetry observation bed Monitor for any arrhythmias Cardiology consult and echocardiogram Check orthostatics  -Acute hypokalemia Replace potassium orally and follow-up electrolytes  -Dehydration IV fluids  -Hypertension Continue Coreg for control of blood pressure  -Hyperlipidemia Resume statin medication  -DVT prophylaxis subcu Lovenox daily  All the records are reviewed and case discussed with ED provider. Management plans discussed with the patient, family and they are in agreement.  CODE STATUS:Full code Code Status History    Date Active Date Inactive Code Status Order ID Comments User Context   11/17/2017 1653 11/20/2017 1514 Full Code [id number]  Milagros Loll, MD ED   10/05/2016 1444 10/10/2016 0233 Full Code [id number]  Jodelle Gross, NP Inpatient   10/03/2016 1820 10/05/2016 1342 Full Code [id number]  Enid Baas, MD ED       TOTAL TIME TAKING CARE OF THIS PATIENT: 51 minutes.    Ihor Austin M.D on 04/24/2018 at 5:33 PM  Between 7am to 6pm - Pager - [phone]  After 6pm go to [email or web address] - password [email or web address] [facility]  [facility]  Office  [phone]  CC: Primary care physician; Gustavo Lah, MD
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
## 2018-04-24 NOTE — ED Notes (Signed)
ED TO INPATIENT HANDOFF REPORT  ED Nurse Name and Phone #: Shanda Bumps [phone]  S Name/Age/Gender Shannon Chen 73 y.o. female Room/Bed: ED02A/ED02A  Code Status   Code Status: Prior  Home/SNF/Other Home Patient oriented to: self Is this baseline? [email or web address]  Triage Complete: Triage complete  Chief Complaint fall  Triage Note Pt came via EMS from the [facility] after falling backwards and hitting her head. Pt has had multiple EKG changes. Pt was diaphoretic and drooling before EMS arrival but was talking with EMS got to her. EMS says pupils are PERRLA.    Allergies No Known Allergies  Level of Care/Admitting Diagnosis ED Disposition    ED Disposition Condition Comment   Admit  Hospital Area: [facility] [[id number]]  Level of Care: Telemetry [5]  Diagnosis: Syncope and collapse [780.2.ICD-9-CM]  Admitting Physician: Ihor Austin [[id number]]  Attending Physician: Ihor Austin [[id number]]  PT Class (Do Not Modify): Observation [104]  PT Acc Code (Do Not Modify): Observation [10022]       B Medical/Surgery History Past Medical History:  Diagnosis Date  . Arthritis   . AV block, Mobitz II    a. 09/2016 s/p MDT [id number] Azure XT DR MRI DC PPM  . Carotid artery disease ([name])    a. 09/2016 Carotid U/S: RICA 70-99%, LICA 50-69%;  b. 09/2016 CTA Neck: RICA 60, RCCA 30, LICA 70%, L Vert 60, L Basilar 70.  . Essential hypertension   . Hyperlipidemia   . LVH (left ventricular hypertrophy)    a. 09/2016 Echo: EF 60-65%, no rwma, Gr1 DD, Ca2+ MV annulus; b. 11/2017 Echo: EF 70-75%, no rwma, Gr1 DD, mild AS vs dynamic LVOT obs. Nl RV fxn. Hyperdynamic LVEF w/ sev LVH.   Past Surgical History:  Procedure Laterality Date  . ABDOMINAL HYSTERECTOMY    . PACEMAKER IMPLANT N/A 10/08/2016   Procedure: Pacemaker Implant;  Surgeon: Marinus Maw, MD;  Location: [facility] INVASIVE CV LAB;  Service: Cardiovascular;  Laterality: N/A;     A IV Location/Drains/Wounds Patient  Lines/Drains/Airways Status   Active Line/Drains/Airways    Name:   Placement date:   Placement time:   Site:   Days:   Peripheral IV 04/24/18 Left Antecubital   04/24/18    1555    Antecubital   less than 1   Incision (Closed) 10/08/16 Chest Left   10/08/16    1200     563          Intake/Output Last 24 hours No intake or output data in the 24 hours ending 04/24/18 1728  Labs/Imaging Results for orders placed or performed during the hospital encounter of 04/24/18 (from the past 48 hour(s))  Basic metabolic panel     Status: Abnormal   Collection Time: 04/24/18  3:26 PM  Result Value Ref Range   Sodium 139 135 - 145 mmol/L   Potassium 3.1 (L) 3.5 - 5.1 mmol/L   Chloride 107 98 - 111 mmol/L   CO2 19 (L) 22 - 32 mmol/L   Glucose, Bld 198 (H) 70 - 99 mg/dL   BUN 24 (H) 8 - 23 mg/dL   Creatinine, Ser 9.92 0.44 - 1.00 mg/dL   Calcium 8.3 (L) 8.9 - 10.3 mg/dL   GFR calc non Af Amer >60 >60 mL/min   GFR calc Af Amer >60 >60 mL/min   Anion gap 13 5 - 15    Comment: Performed at [facility], [address], [place], [place] [address]  Ethanol  Status: None   Collection Time: 04/24/18  3:26 PM  Result Value Ref Range   Alcohol, Ethyl (B) <10 <10 mg/dL    Comment: (NOTE) Lowest detectable limit for serum alcohol is 10 mg/dL. For medical purposes only. Performed at [facility], [address] [place], [place] [address]   CBC with Differential     Status: Abnormal   Collection Time: 04/24/18  3:26 PM  Result Value Ref Range   WBC 9.7 4.0 - 10.5 K/uL   RBC 4.53 3.87 - 5.11 MIL/uL   Hemoglobin 13.1 12.0 - 15.0 g/dL   HCT 60.439.8 54.036.0 - 98.146.0 %   MCV 87.9 80.0 - 100.0 fL   MCH 28.9 26.0 - 34.0 pg   MCHC 32.9 30.0 - 36.0 g/dL   RDW 19.112.5 47.811.5 - 29.515.5 %   Platelets 309 150 - 400 K/uL   nRBC 0.0 0.0 - 0.2 %   Neutrophils Relative % 29 %   Neutro Abs 2.8 1.7 - 7.7 K/uL   Lymphocytes Relative 63 %   Lymphs Abs 6.1 (H) 0.7 - 4.0 K/uL   Monocytes Relative 7 %    Monocytes Absolute 0.6 0.1 - 1.0 K/uL   Eosinophils Relative 1 %   Eosinophils Absolute 0.1 0.0 - 0.5 K/uL   Basophils Relative 0 %   Basophils Absolute 0.0 0.0 - 0.1 K/uL   Immature Granulocytes 0 %   Abs Immature Granulocytes 0.02 0.00 - 0.07 K/uL    Comment: Performed at [facility], [address] [place], [place] [address]  Troponin I - Once     Status: None   Collection Time: 04/24/18  3:26 PM  Result Value Ref Range   Troponin I <0.03 <0.03 ng/mL    Comment: Performed at [facility], [address] [place], [place] [address]  Urinalysis, Complete w Microscopic     Status: Abnormal   Collection Time: 04/24/18  4:00 PM  Result Value Ref Range   Color, Urine AMBER (A) YELLOW    Comment: BIOCHEMICALS MAY BE AFFECTED BY COLOR   APPearance CLEAR (A) CLEAR   Specific Gravity, Urine 1.027 1.005 - 1.030   pH 5.0 5.0 - 8.0   Glucose, UA NEGATIVE NEGATIVE mg/dL   Hgb urine dipstick NEGATIVE NEGATIVE   Bilirubin Urine NEGATIVE NEGATIVE   Ketones, ur 5 (A) NEGATIVE mg/dL   Protein, ur 30 (A) NEGATIVE mg/dL   Nitrite NEGATIVE NEGATIVE   Leukocytes,Ua NEGATIVE NEGATIVE   RBC / HPF 0-5 0 - 5 RBC/hpf   WBC, UA 0-5 0 - 5 WBC/hpf   Bacteria, UA RARE (A) NONE SEEN   Squamous Epithelial / LPF 0-5 0 - 5   Mucus PRESENT     Comment: Performed at [facility], [address] [place], [place] [address]  Urine Drug Screen, Qualitative     Status: None   Collection Time: 04/24/18  4:00 PM  Result Value Ref Range   Tricyclic, Ur Screen NONE DETECTED NONE DETECTED   Amphetamines, Ur Screen NONE DETECTED NONE DETECTED   MDMA (Ecstasy)Ur Screen NONE DETECTED NONE DETECTED   Cocaine Metabolite,Ur [employer] NONE DETECTED NONE DETECTED   Opiate, Ur Screen NONE DETECTED NONE DETECTED   Phencyclidine (PCP) Ur S NONE DETECTED NONE DETECTED   Cannabinoid 50 Ng, Ur [place] NONE DETECTED NONE DETECTED   Barbiturates, Ur Screen NONE DETECTED NONE DETECTED   Benzodiazepine, Ur Scrn  NONE DETECTED NONE DETECTED   Methadone Scn, Ur NONE DETECTED NONE DETECTED  Comment: (NOTE) Tricyclics + metabolites, urine    Cutoff 1000 ng/mL Amphetamines + metabolites, urine  Cutoff 1000 ng/mL MDMA (Ecstasy), urine              Cutoff 500 ng/mL Cocaine Metabolite, urine          Cutoff 300 ng/mL Opiate + metabolites, urine        Cutoff 300 ng/mL Phencyclidine (PCP), urine         Cutoff 25 ng/mL Cannabinoid, urine                 Cutoff 50 ng/mL Barbiturates + metabolites, urine  Cutoff 200 ng/mL Benzodiazepine, urine              Cutoff 200 ng/mL Methadone, urine                   Cutoff 300 ng/mL The urine drug screen provides only a preliminary, unconfirmed analytical test result and should not be used for non-medical purposes. Clinical consideration and professional judgment should be applied to any positive drug screen result due to possible interfering substances. A more specific alternate chemical method must be used in order to obtain a confirmed analytical result. Gas chromatography / mass spectrometry (GC/MS) is the preferred confirmat ory method. Performed at [facility], [address] [place], [place] [address]   Magnesium     Status: None   Collection Time: 04/24/18  4:26 PM  Result Value Ref Range   Magnesium 2.0 1.7 - 2.4 mg/dL    Comment: Performed at [facility], [address] [place] [place], [place] [address]   Dg Chest Portable 1 View  Result Date: 04/24/2018 CLINICAL DATA:  73 year old with syncope. EXAM: PORTABLE CHEST 1 VIEW COMPARISON:  11/17/2017 FINDINGS: Stable appearance of the left dual chamber cardiac pacemaker. Heart size is normal. Atherosclerotic calcifications at the aortic arch. Lungs are clear. Negative for pneumothorax. Bone structures are intact. Degenerative changes at the right [facility] joint. IMPRESSION: No active disease. Electronically Signed   By: Richarda Overlie M.D.   On: 04/24/2018 16:09    Pending Labs Unresulted Labs  (From admission, onward)    Start     Ordered   Signed and Held  CBC  (enoxaparin (LOVENOX)    CrCl >/= 30 ml/min)  Once,   R    Comments:  Baseline for enoxaparin therapy IF NOT ALREADY DRAWN.  Notify MD if PLT < 100 K.    Signed and Held   Signed and Held  Creatinine, serum  (enoxaparin (LOVENOX)    CrCl >/= 30 ml/min)  Once,   R    Comments:  Baseline for enoxaparin therapy IF NOT ALREADY DRAWN.    Signed and Held   Signed and Held  Creatinine, serum  (enoxaparin (LOVENOX)    CrCl >/= 30 ml/min)  Weekly,   R    Comments:  while on enoxaparin therapy    Signed and Held   Signed and Held  Basic metabolic panel  Tomorrow morning,   R     Signed and Held   Signed and Held  TSH  Once,   R     Signed and Held          Vitals/Pain Today's Vitals   04/24/18 1614 04/24/18 1621 04/24/18 1630 04/24/18 1700  BP:  (!) 117/59 (!) 126/58 138/63  Pulse:  72 70 80  Resp:  Temp:      TempSrc:  SpO2:  98% 99% 95%  PainSc: 10-Worst pain ever       Isolation Precautions No active isolations  Medications Medications  sodium chloride 0.9 % bolus 1,000 mL (1,000 mLs Intravenous Bolus 04/24/18 1555)  acetaminophen (TYLENOL) tablet 1,000 mg (1,000 mg Oral Given 04/24/18 1613)  potassium chloride (KLOR-CON) packet 40 mEq (40 mEq Oral Given 04/24/18 1711)    Mobility walks with person assist Moderate fall risk   Focused Assessments Neuro Assessment Handoff:  Swallow screen pass? Not ordered         Neuro Assessment: Exceptions to WDL Neuro Checks:      Last Documented NIHSS Modified Score:   Has TPA been given? No If patient is a Neuro Trauma and patient is going to OR before floor call report to 4N Charge nurse: [phone] or [phone]     R Recommendations: See Admitting Provider Note  Report given to:   Additional Notes: pt fell today and hit head; pt has pacemaker that has been interrogated and found 4 runs of SVT since October including one yesterday;  pt is oriented to self and time but not situation (doesn't remember falling)

## 2018-04-24 NOTE — ED Triage Notes (Signed)
Pt came via EMS from the [facility] after falling backwards and hitting her head. Pt has had multiple EKG changes. Pt was diaphoretic and drooling before EMS arrival but was talking with EMS got to her. EMS says pupils are PERRLA.

## 2018-04-24 NOTE — Progress Notes (Signed)
Advanced care plan. Purpose of the Encounter: CODE STATUS Parties in Attendance: Patient Patient's Decision Capacity: Good Subjective/Patient's story:  Shannon Chen  is a 73 y.o. female with a known history of cardiac arrest, carotid artery disease, hypertension, hyperlipidemia, left ventricular hypertrophy, arthritis was brought to the emergency room for passing out.  Patient had prior episodes of syncope and was evaluated by [facility] cardiology.   Objective/Medical story Patient evaluated in the emergency room with EKG. pacer download did not reveal any arrhythmia.  Appears dry and dehydrated needs IV fluid hydration.  Syncope could be secondary to orthostasis. Goals of care determination:  Advance care directives goals of care and treatment plan discussed For now wants everything done which includes CPR, intubation ventilator if the need arises CODE STATUS: Full code Time spent discussing advanced care planning: 16 minutes

## 2018-04-25 ENCOUNTER — Observation Stay (HOSPITAL_BASED_OUTPATIENT_CLINIC_OR_DEPARTMENT_OTHER)
Admit: 2018-04-25 | Discharge: 2018-04-25 | Disposition: A | Discharge status: Home / Self Care | Payer: Medicare Other | Attending: Internal Medicine | Admitting: Internal Medicine

## 2018-04-25 ENCOUNTER — Observation Stay: Payer: Medicare Other

## 2018-04-25 DIAGNOSIS — I340 Nonrheumatic mitral (valve) insufficiency: Secondary | ICD-10-CM | POA: Diagnosis not present

## 2018-04-25 DIAGNOSIS — R55 Syncope and collapse: Secondary | ICD-10-CM

## 2018-04-25 DIAGNOSIS — E860 Dehydration: Secondary | ICD-10-CM

## 2018-04-25 DIAGNOSIS — I517 Cardiomegaly: Secondary | ICD-10-CM

## 2018-04-25 DIAGNOSIS — I951 Orthostatic hypotension: Secondary | ICD-10-CM

## 2018-04-25 DIAGNOSIS — I119 Hypertensive heart disease without heart failure: Secondary | ICD-10-CM

## 2018-04-25 DIAGNOSIS — I441 Atrioventricular block, second degree: Secondary | ICD-10-CM

## 2018-04-25 DIAGNOSIS — Z950 Presence of cardiac pacemaker: Secondary | ICD-10-CM

## 2018-04-25 LAB — BASIC METABOLIC PANEL
Anion gap: 9 (ref 5–15)
BUN: 24 mg/dL — ABNORMAL HIGH (ref 8–23)
CO2: 18 mmol/L — ABNORMAL LOW (ref 22–32)
CREATININE: 0.75 mg/dL (ref 0.44–1.00)
Calcium: 8.4 mg/dL — ABNORMAL LOW (ref 8.9–10.3)
Chloride: 110 mmol/L (ref 98–111)
GFR calc Af Amer: >60 mL/min (ref >60)
GFR calc non Af Amer: >60 mL/min (ref >60)
Glucose, Bld: 132 mg/dL — ABNORMAL HIGH (ref 70–99)
Potassium: 3.7 mmol/L (ref 3.5–5.1)
Sodium: 137 mmol/L (ref 135–145)

## 2018-04-25 LAB — ECHOCARDIOGRAM COMPLETE
Height: 66 in
Weight: 2324.8 oz

## 2018-04-25 MED ORDER — SODIUM CHLORIDE 0.9 % IV SOLN
INTRAVENOUS | Status: DC
  Administered 2018-04-25: 23:00:00 via INTRAVENOUS

## 2018-04-25 MED ORDER — DILTIAZEM HCL ER COATED BEADS 120 MG PO CP24: 120.0000 mg | ORAL_CAPSULE | Freq: Every day | ORAL | Status: DC

## 2018-04-25 MED ORDER — SODIUM CHLORIDE 0.9 % IV BOLUS
250.0000 mL | Freq: Once | INTRAVENOUS | Status: AC
  Administered 2018-04-25: 250 mL via INTRAVENOUS

## 2018-04-25 MED ORDER — CARVEDILOL 12.5 MG PO TABS: 12.5000 mg | ORAL_TABLET | Freq: Two times a day (BID) | ORAL | 2 refills | Status: DC

## 2018-04-25 NOTE — Care Management Obs Status (Signed)
MEDICARE OBSERVATION STATUS NOTIFICATION   Patient Details  Name: Shannon Chen MRN: [id number] Date of Birth: 14-Apr-1945   Medicare Observation Status Notification Given:  Yes    Namiko Pritts A Deegan Valentino, RN 04/25/2018, 10:33 AM

## 2018-04-25 NOTE — Progress Notes (Signed)
BP dropped with orthostatic check, as patient received oral coreg and cardizem this AM Continue IV fluids and discontinue discharge. Monitor- meds stopped.

## 2018-04-25 NOTE — Evaluation (Signed)
Physical Therapy Evaluation Patient Details Name: Shannon Chen MRN: [id number] DOB: [birth year] Today's Date: 04/25/2018   History of Present Illness  Patient is a 73 y/o female that presents with syncopal episode with collapse and hypotension. She has had similar episode(s) int he past, has an AV block, CAD, hypertension, and pacemaker. In the ED, pacemaker information was downloaded without significant event seen.   Clinical Impression  Patient was admitted after a syncopal episode while grocery shopping earlier this week. She did not have any remarkable events shown on her pacemaker, and was noted to be hypotensive in the ER. She was initially mildly orthostatic in this session, though no significant complaints of dizziness or light headedness noted throughout the session. She did utilize HHA from IV pole intermittently, unclear if due to balance deficits acutely or at her baseline. Would recommend HHPT with a RW for now to manage balance deficits when she is appropriate for discharge.     Follow Up Recommendations Home health PT    Equipment Recommendations  Rolling walker with 5" wheels    Recommendations for Other Services       Precautions / Restrictions Precautions Precautions: Fall Restrictions Weight Bearing Restrictions: No      Mobility  Bed Mobility Overal bed mobility: Modified Independent             General bed mobility comments: Patient uses hand rails, is otherwise able to complete transfer without assistance.   Transfers Overall transfer level: Needs assistance Equipment used: 1 person hand held assist Transfers: Sit to/from Stand Sit to Stand: Min assist         General transfer comment: Patient used IV pole to help herself up, no other assistance required   Ambulation/Gait Ambulation/Gait assistance: Supervision Gait Distance (Feet): 200 Feet Assistive device: IV Pole Gait Pattern/deviations: WFL(Within Functional Limits)   Gait velocity  interpretation: <1.8 ft/sec, indicate of risk for recurrent falls General Gait Details: Patient utilized IV pole for HHA intermittently as she had one episode of "dizziness". Mild antalgic pattern observed, though could be due to IV pole placement.   Stairs            Wheelchair Mobility    Modified Rankin (Stroke Patients Only)       Balance Overall balance assessment: History of Falls;Mild deficits observed, not formally tested                                           Pertinent Vitals/Pain      Home Living Family/patient expects to be discharged to:: Group home                 Additional Comments: Patient does not use ADs at baseline.     Prior Function Level of Independence: Independent         Comments: Patient walks to get her groceries and denies previous falls (though per notes she has had a similar syncopal episode in the last year).      Hand Dominance        Extremity/Trunk Assessment   Upper Extremity Assessment Upper Extremity Assessment: Overall WFL for tasks assessed    Lower Extremity Assessment Lower Extremity Assessment: Overall WFL for tasks assessed       Communication   Communication: No difficulties  Cognition Arousal/Alertness: Awake/alert Behavior During Therapy: WFL for tasks assessed/performed Overall Cognitive Status: Difficult to assess  General Comments: Patient does not engage in conversation, appears to have a history of cognitive impairments though appears at her baseline.       General Comments      Exercises     Assessment/Plan    PT Assessment Patient needs continued PT services  PT Problem List Decreased strength;Decreased range of motion;Decreased cognition;Decreased activity tolerance;Decreased knowledge of use of DME;Decreased balance;Decreased safety awareness;Decreased mobility       PT Treatment Interventions Gait training;DME  instruction;Therapeutic exercise;Balance training;Stair training;Neuromuscular re-education;Functional mobility training;Cognitive remediation;Therapeutic activities    PT Goals (Current goals can be found in the Care Plan section)  Acute Rehab PT Goals Patient Stated Goal: To find a new place to live PT Goal Formulation: With patient Time For Goal Achievement: 05/09/18 Potential to Achieve Goals: Good    Frequency Min 2X/week   Barriers to discharge Decreased caregiver support      Co-evaluation               AM-PAC PT "6 Clicks" Mobility  Outcome Measure Help needed turning from your back to your side while in a flat bed without using bedrails?: None Help needed moving from lying on your back to sitting on the side of a flat bed without using bedrails?: None Help needed moving to and from a bed to a chair (including a wheelchair)?: None Help needed standing up from a chair using your arms (e.g., wheelchair or bedside chair)?: A Little Help needed to walk in hospital room?: A Little Help needed climbing 3-5 steps with a railing? : A Little 6 Click Score: 21    End of Session Equipment Utilized During Treatment: Gait belt Activity Tolerance: Patient tolerated treatment well;Patient limited by fatigue Patient left: in chair;with chair alarm set Nurse Communication: Mobility status PT Visit Diagnosis: Difficulty in walking, not elsewhere classified (R26.2)    Time: 1828-8337 PT Time Calculation (min) (ACUTE ONLY): 20 min   Charges:   PT Evaluation $PT Eval Moderate Complexity: 1 Mod      Alva Garnet PT, DPT, CSCS      04/25/2018, 2:37 PM

## 2018-04-25 NOTE — Consult Note (Addendum)
Cardiology Consultation:   Patient ID: Shannon Chen MRN: [id number]; DOB: 1945/12/27  Admit date: 04/24/2018 Date of Consult: 04/25/2018  Primary Care Provider: Gustavo Lah, MD Primary Cardiologist: Julien Nordmann, MD , [facility] Primary Electrophysiologist: [name] Reason for consult: Syncope, pacemaker And physician requesting the consult: Dr. Tobi Bastos   Patient Profile:   Shannon Chen is a 73 y.o. female with a hx of pretension, hyperlipidemia, AV block type II with syncope, pacemaker implant August 2018, moderate carotid disease, long smoking history, presented to the hospital after episode of syncope  History of Present Illness:   Shannon Chen reports that she has not been eating or drinking well Etiology unclear, reports that she lives in a group home, might have some food in the house but each resident is responsible for doing their own groceries.  She did not really feel like making anything and went at least a day or more without food.  She does not have a car.  Yesterday walking to a convenience store to get lunch, fell backwards hitting her head on the ground, was difficult to wake up, lethargic EMS arrived pulse 90/50  Brought to the emergency room, pacemaker checked showing no significant arrhythmia Lab work was markedly abnormal on arrival  Potassium 3.1, creatinine 0.92 with BUN 24, above her baseline Normal TSH  Started on IV fluids, She did receive diltiazem and carvedilol last night and again this morning Orthostatics checked, systolic pressure down to 100 with standing, after 3 minutes up to 120   Past Medical History:  Diagnosis Date  . Arthritis   . AV block, Mobitz II    a. 09/2016 s/p MDT [id number] Azure XT DR MRI DC PPM  . Carotid artery disease ([name])    a. 09/2016 Carotid U/S: RICA 70-99%, LICA 50-69%;  b. 09/2016 CTA Neck: RICA 60, RCCA 30, LICA 70%, L Vert 60, L Basilar 70.  . Essential hypertension   . Hyperlipidemia   . LVH (left ventricular  hypertrophy)    a. 09/2016 Echo: EF 60-65%, no rwma, Gr1 DD, Ca2+ MV annulus; b. 11/2017 Echo: EF 70-75%, no rwma, Gr1 DD, mild AS vs dynamic LVOT obs. Nl RV fxn. Hyperdynamic LVEF w/ sev LVH.    Past Surgical History:  Procedure Laterality Date  . ABDOMINAL HYSTERECTOMY    . PACEMAKER IMPLANT N/A 10/08/2016   Procedure: Pacemaker Implant;  Surgeon: Marinus Maw, MD;  Location: [facility] INVASIVE CV LAB;  Service: Cardiovascular;  Laterality: N/A;     Home Medications:  Prior to Admission medications   Medication Sig Start Date End Date Taking? Authorizing Provider  diltiazem (CARDIZEM CD) 120 MG 24 hr capsule Take 120 mg by mouth daily. 12/08/17  Yes [provider]  rosuvastatin (CRESTOR) 40 MG tablet Take 1 tablet (40 mg total) by mouth daily at 6 PM. 10/09/16  Yes Kilroy, Franky Macho K, PA-C  carvedilol (COREG) 12.5 MG tablet Take 1 tablet (12.5 mg total) by mouth 2 (two) times daily. 04/25/18   Enid Baas, MD    Inpatient Medications: Scheduled Meds: . enoxaparin (LOVENOX) injection  40 mg Subcutaneous Q24H  . rosuvastatin  40 mg Oral q1800  . sodium chloride flush  3 mL Intravenous Q12H   Continuous Infusions: . sodium chloride     PRN Meds: acetaminophen **OR** acetaminophen, ondansetron **OR** ondansetron (ZOFRAN) IV, senna-docusate  Allergies:   No Known Allergies  Social History:   Social History   Socioeconomic History  . Marital status: Single    Spouse name: Not  on file  . Number of children: Not on file  . Years of education: Not on file  . Highest education level: Not on file  Occupational History  . Not on file  Social Needs  . Financial resource strain: Not on file  . Food insecurity:    Worry: Not on file    Inability: Not on file  . Transportation needs:    Medical: Not on file    Non-medical: Not on file  Tobacco Use  . Smoking status: Former Smoker    Packs/day: 1.00    Years: 30.00    Pack years: 30.00    Types: Cigarettes  . Smokeless  tobacco: Never Used  Substance and Sexual Activity  . Alcohol use: No  . Drug use: No  . Sexual activity: Not on file  Lifestyle  . Physical activity:    Days per week: Not on file    Minutes per session: Not on file  . Stress: Not on file  Relationships  . Social connections:    Talks on phone: Not on file    Gets together: Not on file    Attends religious service: Not on file    Active member of club or organization: Not on file    Attends meetings of clubs or organizations: Not on file    Relationship status: Not on file  . Intimate partner violence:    Fear of current or ex partner: Not on file    Emotionally abused: Not on file    Physically abused: Not on file    Forced sexual activity: Not on file  Other Topics Concern  . Not on file  Social History Narrative   Independent at baseline   Ambulates without any assistance. Lives by herself    Family History:    Family History  Problem Relation Age of Onset  . Diabetes Mother   . CAD Father      ROS:  Please see the history of present illness.  Review of Systems  Constitutional: Negative.   Respiratory: Negative.   Cardiovascular: Negative.   Gastrointestinal: Negative.   Musculoskeletal: Negative.   Neurological: Positive for loss of consciousness.  Psychiatric/Behavioral: Negative.   All other systems reviewed and are negative.   Physical Exam/Data:   Vitals:   04/25/18 0831 04/25/18 1448 04/25/18 1451 04/25/18 1454  BP: (!) 150/73 95/76 104/65 (!) 55/40  Pulse: 66 (!) 59 62 67  Resp: 20     Temp: 98 F (36.7 C)     TempSrc: Oral     SpO2: 100%     Weight:      Height:        Intake/Output Summary (Last 24 hours) at 04/25/2018 1612 Last data filed at 04/25/2018 1300 Gross per 24 hour  Intake 600 ml  Output 300 ml  Net 300 ml   Last 3 Weights 04/25/2018 04/24/2018 12/08/2017  Weight (lbs) 145 lb 4.8 oz 145 lb 14.4 oz 146 lb 8 oz  Weight (kg) 65.908 kg 66.18 kg 66.452 kg     Body mass index is  23.45 kg/m.  General: Moderately disheveled, poor dentition,  HEENT: normal Lymph: no adenopathy Neck: no JVD Endocrine:  No thryomegaly Vascular: No carotid bruits; FA pulses 2+ bilaterally without bruits  Cardiac:  normal S1, S2; RRR; 2/6 systolic ejection murmur left sternal border murmur  Lungs:  clear to auscultation bilaterally, no wheezing, rhonchi or rales  Abd: soft, nontender, no hepatomegaly  Ext: no edema Musculoskeletal:  No deformities, BUE and BLE strength normal and equal Skin: warm and dry  Neuro:  CNs 2-12 intact, no focal abnormalities noted Psych: Communicative but not very talkative, alert and oriented  EKG:  The EKG was personally reviewed and demonstrates: Normal sinus rhythm rate 80 bpm consider right bundle branch block, nonspecific T wave abnormality V1 through V4, lead I and aVL Telemetry:  Telemetry was personally reviewed and demonstrates: Normal sinus rhythm  Relevant CV Studies: Echocardiogram October 2019 Left ventricle: The cavity size was normal. Wall thickness was  increased in a pattern of severe LVH. The basal inferior wall is relatively spared. Systolic function was hyperdynamic. The  estimated ejection fraction was in the range of 70% to 75%.   Laboratory Data:   Chemistry Recent Labs  Lab 04/24/18 1526 04/25/18 0543  NA 139 137  K 3.1* 3.7  CL 107 110  CO2 19* 18*  GLUCOSE 198* 132*  BUN 24* 24*  CREATININE 0.92 0.75  CALCIUM 8.3* 8.4*  GFRNONAA >60 >60  GFRAA >60 >60  ANIONGAP 13 9    No results for input(s): PROT, ALBUMIN, AST, ALT, ALKPHOS, BILITOT in the last 168 hours. Hematology Recent Labs  Lab 04/24/18 1526  WBC 9.7  RBC 4.53  HGB 13.1  HCT 39.8  MCV 87.9  MCH 28.9  MCHC 32.9  RDW 12.5  PLT 309   Cardiac Enzymes Recent Labs  Lab 04/24/18 1526  TROPONINI <0.03   No results for input(s): TROPIPOC in the last 168 hours.  BNPNo results for input(s): BNP, PROBNP in the last 168 hours.  DDimer No results for  input(s): DDIMER in the last 168 hours.  Radiology/Studies:  Ct Head Wo Contrast  Result Date: 04/24/2018 CLINICAL DATA:  Fall.  Trauma to back of head.  Abnormal EKG. EXAM: CT HEAD WITHOUT CONTRAST TECHNIQUE: Contiguous axial images were obtained from the base of the skull through the vertex without intravenous contrast. COMPARISON:  CT head without contrast 10/03/2016 FINDINGS: Brain: Advanced atrophy and white matter disease is present bilaterally. Remote lacunar infarcts of the basal ganglia bilaterally are stable. Asymmetric volume loss is present on the left. Ex vacuo dilation of the left lateral ventricle is noted. No acute infarct, hemorrhage, or mass lesion is present. The ventricles are of proportionate to the degree of atrophy. No significant extraaxial fluid collection is present. The brainstem and cerebellum are within normal limits. Vascular: Atherosclerotic calcifications are present there is no hyperdense vessel. Skull: Calvarium is intact. No focal lytic or blastic lesions are present. Sinuses/Orbits: Fluid is present and posterior ethmoid air cells. The paranasal sinuses and mastoid air cells are otherwise clear. IMPRESSION: 1. No acute intracranial abnormality. 2. Stable advanced atrophy and white matter disease. 3. 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 4. Ex vacuo dilation of the lateral ventricles, left greater than right. Electronically Signed   By: Marin Roberts M.D.   On: 04/24/2018 18:42   US Carotid Bilateral  Result Date: 04/25/2018 CLINICAL DATA:  Syncope EXAM: BILATERAL CAROTID DUPLEX ULTRASOUND TECHNIQUE: Wallace Cullens scale imaging, color Doppler and duplex ultrasound were performed of bilateral carotid and vertebral arteries in the neck. COMPARISON:  None. FINDINGS: Criteria: Quantification of carotid stenosis is based on velocity parameters that correlate the residual internal carotid diameter with NASCET-based stenosis levels, using the  diameter of the distal internal carotid lumen as the denominator for stenosis measurement. The following velocity measurements were obtained: RIGHT ICA: 170/42 cm/sec CCA: 115/22 cm/sec SYSTOLIC ICA/CCA  RATIO:  1.5 ECA: 78 cm/sec LEFT ICA: 198/4 cm/sec CCA: 94/19 cm/sec SYSTOLIC ICA/CCA RATIO:  2.1 ECA: 74 cm/sec RIGHT CAROTID ARTERY: Moderate carotid atherosclerosis. Calcific plaque formation of the proximal ICA. Proximal ICA mild velocity elevation measures 170/42 cm per second with slight turbulent flow. Right ICA stenosis estimated at 50-69% by ultrasound criteria. RIGHT VERTEBRAL ARTERY:  Antegrade LEFT CAROTID ARTERY: Similar moderate calcific plaque formation. Luminal narrowing of the proximal ICA. Proximal ICA mild velocity elevation measures 198/40 centimeters/second with mild turbulent flow. Degree of stenosis also estimated at 50-69% by ultrasound criteria. LEFT VERTEBRAL ARTERY:  Antegrade IMPRESSION: Moderate carotid atherosclerosis. Bilateral ICA stenoses within the 50-69% range by ultrasound criteria as above. Patent antegrade vertebral flow bilaterally Electronically Signed   By: Judie Petit.  Shick M.D.   On: 04/25/2018 09:29   Dg Chest Portable 1 View  Result Date: 04/24/2018 CLINICAL DATA:  73 year old with syncope. EXAM: PORTABLE CHEST 1 VIEW COMPARISON:  11/17/2017 FINDINGS: Stable appearance of the left dual chamber cardiac pacemaker. Heart size is normal. Atherosclerotic calcifications at the aortic arch. Lungs are clear. Negative for pneumothorax. Bone structures are intact. Degenerative changes at the right [facility] joint. IMPRESSION: No active disease. Electronically Signed   By: Richarda Overlie M.D.   On: 04/24/2018 16:09    Assessment and Plan:   1) syncope Appears to be secondary to orthostasis Possibly overmedicated, also presenting with dehydration Poor fluid and food intake at least the past 24 hours,  Etiology for her anorexia is unclear.  She denies having a financial issue, but more of a  motivational issue to go to local convenience store to get food -She does not have a car -Consult case manager to see if she qualifies for Meals on Wheels -In terms of her blood pressure, still orthostatic this morning and she did receive carvedilol and diltiazem last night and this morning.  --- Would continue IV fluids for now  hold the diltiazem, decrease Coreg down to 6.25 mg twice daily only once orthostatic numbers have stabilized -Stressed the importance that she needs to eat and drink and not skip meals  2) secondary AV block History of pacemaker Pacer interrogated in the emergency room and by report there was no arrhythmia yesterday contributing to her syncope -Medication changes as above  3) carotid stenosis 50 to 60% bilateral disease Continue aspirin, statin  4) essential hypertension Given poor p.o. intake, recent anorexia, will need to cut back drastically on her medications to avoid recurrent syncope She was orthostatic again this morning on her medications. As above would hold the diltiazem, decrease Coreg down to 6.26 mg twice daily Outpatient follow-up  5) severe hypertrophy Documented on echocardiogram, this will make her very sensitive to dehydration Cavity obliteration in systole Agree with hydration   Case discussed with hospitalist service and with nursing  Total encounter time more than 110 minutes  Greater than 50% was spent in counseling and coordination of care with the patient   For questions or updates, please contact [facility] Please consult [email or web address] for contact info under     Signed, Julien Nordmann, MD  04/25/2018 4:12 PM

## 2018-04-25 NOTE — Progress Notes (Signed)
[facility] [place] at [facility]   PATIENT NAME: Shannon Chen    MR#:  [id number]  DATE OF BIRTH:  1945-08-26  SUBJECTIVE:  CHIEF COMPLAINT:   Chief Complaint  Patient presents with  . Fall   -Came in after she passed out at the store yesterday. -denies any complaints now  REVIEW OF SYSTEMS:  Review of Systems  Constitutional: Positive for malaise/fatigue. Negative for chills and fever.  HENT: Negative for congestion, ear discharge, hearing loss and nosebleeds.   Eyes: Negative for blurred vision and double vision.  Respiratory: Negative for cough, shortness of breath and wheezing.   Cardiovascular: Negative for chest pain, palpitations and leg swelling.  Gastrointestinal: Negative for abdominal pain, constipation, diarrhea, nausea and vomiting.  Genitourinary: Negative for dysuria.  Musculoskeletal: Negative for myalgias.  Neurological: Negative for dizziness, focal weakness, seizures, weakness and headaches.  Psychiatric/Behavioral: Negative for depression.    DRUG ALLERGIES:  No Known Allergies  VITALS:  Blood pressure (!) 150/73, pulse 66, temperature 98 F (36.7 C), temperature source Oral, resp. rate 20, height 5\' 6"  (1.676 m), weight 65.9 kg, SpO2 100 %.  PHYSICAL EXAMINATION:  Physical Exam   GENERAL:  73 y.o.-year-old patient lying in the bed with no acute distress.  EYES: Pupils equal, round, reactive to light and accommodation. No scleral icterus. Extraocular muscles intact.  HEENT: Head atraumatic, normocephalic. Oropharynx and nasopharynx clear.  NECK:  Supple, no jugular venous distention. No thyroid enlargement, no tenderness.  LUNGS: Normal breath sounds bilaterally, no wheezing, rales,rhonchi or crepitation. No use of accessory muscles of respiration. Decreased bibasilar breath sounds CARDIOVASCULAR: S1, S2 normal. No  rubs, or gallops. 2/6 systolic murmur present ABDOMEN: Soft, nontender, nondistended. Bowel sounds present. No  organomegaly or mass.  EXTREMITIES: No pedal edema, cyanosis, or clubbing.  NEUROLOGIC: Cranial nerves II through XII are intact. Muscle strength 5/5 in all extremities. Sensation intact. Gait not checked.  PSYCHIATRIC: The patient is alert and oriented x 3.  SKIN: No obvious rash, lesion, or ulcer.    LABORATORY PANEL:   CBC Recent Labs  Lab 04/24/18 1526  WBC 9.7  HGB 13.1  HCT 39.8  PLT 309   ------------------------------------------------------------------------------------------------------------------  Chemistries  Recent Labs  Lab 04/24/18 1626 04/25/18 0543  NA  --  137  K  --  3.7  CL  --  110  CO2  --  18*  GLUCOSE  --  132*  BUN  --  24*  CREATININE  --  0.75  CALCIUM  --  8.4*  MG 2.0  --    ------------------------------------------------------------------------------------------------------------------  Cardiac Enzymes Recent Labs  Lab 04/24/18 1526  TROPONINI <0.03   ------------------------------------------------------------------------------------------------------------------  RADIOLOGY:  Ct Head Wo Contrast  Result Date: 04/24/2018 CLINICAL DATA:  Fall.  Trauma to back of head.  Abnormal EKG. EXAM: CT HEAD WITHOUT CONTRAST TECHNIQUE: Contiguous axial images were obtained from the base of the skull through the vertex without intravenous contrast. COMPARISON:  CT head without contrast 10/03/2016 FINDINGS: Brain: Advanced atrophy and white matter disease is present bilaterally. Remote lacunar infarcts of the basal ganglia bilaterally are stable. Asymmetric volume loss is present on the left. Ex vacuo dilation of the left lateral ventricle is noted. No acute infarct, hemorrhage, or mass lesion is present. The ventricles are of proportionate to the degree of atrophy. No significant extraaxial fluid collection is present. The brainstem and cerebellum are within normal limits. Vascular: Atherosclerotic calcifications are present there is no hyperdense  vessel. Skull: Calvarium  is intact. No focal lytic or blastic lesions are present. Sinuses/Orbits: Fluid is present and posterior ethmoid air cells. The paranasal sinuses and mastoid air cells are otherwise clear. IMPRESSION: 1. No acute intracranial abnormality. 2. Stable advanced atrophy and white matter disease. 3. 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 4. Ex vacuo dilation of the lateral ventricles, left greater than right. Electronically Signed   By: Marin Robertshristopher  Mattern M.D.   On: 04/24/2018 18:42   [place] Carotid Bilateral  Result Date: 04/25/2018 CLINICAL DATA:  Syncope EXAM: BILATERAL CAROTID DUPLEX ULTRASOUND TECHNIQUE: Wallace CullensGray scale imaging, color Doppler and duplex ultrasound were performed of bilateral carotid and vertebral arteries in the neck. COMPARISON:  None. FINDINGS: Criteria: Quantification of carotid stenosis is based on velocity parameters that correlate the residual internal carotid diameter with NASCET-based stenosis levels, using the diameter of the distal internal carotid lumen as the denominator for stenosis measurement. The following velocity measurements were obtained: RIGHT ICA: 170/42 cm/sec CCA: 115/22 cm/sec SYSTOLIC ICA/CCA RATIO:  1.5 ECA: 78 cm/sec LEFT ICA: 198/4 cm/sec CCA: 94/19 cm/sec SYSTOLIC ICA/CCA RATIO:  2.1 ECA: 74 cm/sec RIGHT CAROTID ARTERY: Moderate carotid atherosclerosis. Calcific plaque formation of the proximal ICA. Proximal ICA mild velocity elevation measures 170/42 cm per second with slight turbulent flow. Right ICA stenosis estimated at 50-69% by ultrasound criteria. RIGHT VERTEBRAL ARTERY:  Antegrade LEFT CAROTID ARTERY: Similar moderate calcific plaque formation. Luminal narrowing of the proximal ICA. Proximal ICA mild velocity elevation measures 198/40 centimeters/second with mild turbulent flow. Degree of stenosis also estimated at 50-69% by ultrasound criteria. LEFT VERTEBRAL ARTERY:  Antegrade IMPRESSION: Moderate  carotid atherosclerosis. Bilateral ICA stenoses within the 50-69% range by ultrasound criteria as above. Patent antegrade vertebral flow bilaterally Electronically Signed   By: Judie PetitM.  Shick M.D.   On: 04/25/2018 09:29   Dg Chest Portable 1 View  Result Date: 04/24/2018 CLINICAL DATA:  73 year old with syncope. EXAM: PORTABLE CHEST 1 VIEW COMPARISON:  11/17/2017 FINDINGS: Stable appearance of the left dual chamber cardiac pacemaker. Heart size is normal. Atherosclerotic calcifications at the aortic arch. Lungs are clear. Negative for pneumothorax. Bone structures are intact. Degenerative changes at the right [employer] joint. IMPRESSION: No active disease. Electronically Signed   By: Richarda OverlieAdam  Henn M.D.   On: 04/24/2018 16:09    EKG:   Orders placed or performed during the hospital encounter of 04/24/18  . EKG 12-Lead  . EKG 12-Lead  . Repeat EKG  . Repeat EKG    ASSESSMENT AND PLAN:   73 year old female with prior medical history significant for type II AV block status post pacemaker, carotid artery disease, hypertension presents to hospital secondary to a syncopal episode.  1.  Syncope-likely vasovagal/secondary to hypotension -Dehydration.  Did not eat anything for a day prior to this.  Denies any aura -No seizure-like activity -Received IV fluids, no orthostatic hypotension -Cardiology consult pending.  Echocardiogram done -Physical therapy evaluation today.  2.  History of AV block status post pacemaker-continue Coreg and Cardizem -Follow-up cardiology input  3.  Carotid artery disease-repeat carotid Doppler showing moderate 50 to 60% carotid artery disease bilaterally.  No change from previous Dopplers.  4.  DVT prophylaxis-on Lovenox  Ambulate with physical therapy    All the records are reviewed and case discussed with Care Management/Social Workerr. Management plans discussed with the patient, family and they are in agreement.  CODE STATUS: Full Code  TOTAL TIME TAKING CARE OF THIS  PATIENT: 38 minutes.   POSSIBLE D/C  IN 1-2 DAYS, DEPENDING ON CLINICAL CONDITION.   Enid Baas M.D on 04/25/2018 at 11:43 AM  Between 7am to 6pm - Pager - [phone]  After 6pm go to [email or web address] - Social research officer, government  [facility] [place] [facility] [facility]  Office  [phone]  CC: Primary care physician; Gustavo Lah, MD

## 2018-04-25 NOTE — Progress Notes (Signed)
*  PRELIMINARY RESULTS* Echocardiogram 2D Echocardiogram has been performed.  Shannon Chen Shannon Chen 04/25/2018, 12:18 PM

## 2018-04-25 NOTE — Clinical Social Work Note (Signed)
The CSW received a consult for "other" with no comment given as to the nature of the consult. The CSW performed a chart review and no social work needs were apparent. Please consult should needs arise; the CSW is signing off.  Argentina Ponder, MSW, LCSW [phone]

## 2018-04-26 ENCOUNTER — Telehealth: Payer: Self-pay | Admitting: Cardiovascular Disease

## 2018-04-26 DIAGNOSIS — R55 Syncope and collapse: Secondary | ICD-10-CM | POA: Diagnosis not present

## 2018-04-26 LAB — GLUCOSE, CAPILLARY
Glucose-Capillary: 104 mg/dL — ABNORMAL HIGH (ref 70–99)
Glucose-Capillary: 91 mg/dL (ref 70–99)

## 2018-04-26 MED ORDER — TRAMADOL HCL 50 MG PO TABS
50.0000 mg | ORAL_TABLET | ORAL | Status: AC
  Administered 2018-04-26: 50 mg via ORAL
  Filled 2018-04-26: qty 1

## 2018-04-26 MED ORDER — CARVEDILOL 6.25 MG PO TABS
6.2500 mg | ORAL_TABLET | Freq: Two times a day (BID) | ORAL | Status: DC
  Administered 2018-04-26: 6.25 mg via ORAL
  Filled 2018-04-26: qty 1

## 2018-04-26 NOTE — Telephone Encounter (Signed)
Pt needs a TCM appointment from 3/8

## 2018-04-26 NOTE — Care Management Note (Signed)
Case Management Note  Patient Details  Name: Shannon Chen MRN: [id number] Date of Birth: 03-Aug-1945  Subjective/Objective:     Patient is discharging to home today.  She states she lives in a boarding house.  This house does not have a name.  She does not have  Transportation when discharged.  When asked patient what her address is, she states one different from the chart.  I explained  that was not the address on chart.  She stated she used to live with her sister; then provided me with that address.  This RNCM provided patient with a taxi voucher to transport to home.  She is current with PCP.  Obtains medications at [facility] on [address] [facility] difficulty. Offered HH services and patient declines.  No further needs identified at this time.              Action/Plan:   Expected Discharge Date:  04/26/18               Expected Discharge Plan:  Home/Self Care  In-House Referral:  Clinical Social Work  Discharge planning Services  CM Consult  Post Acute Care Choice:    Choice offered to:     DME Arranged:    DME Agency:     HH Arranged:  Patient Refused HH HH Agency:     Status of Service:  Completed, signed off  If discussed at [employer], dates discussed:    Additional Comments:  Sherren Kerns, RN 04/26/2018, 2:02 PM

## 2018-04-26 NOTE — Progress Notes (Signed)
Progress Note  Patient Name: Shannon Chen Date of Encounter: 04/26/2018  Primary Cardiologist: [name] Primary Electrophysiologist: [name]  Subjective   Notes thoracic back pain this morning. "Feels like a pulled muscle." Appetite remains poor. No chest pain, SOB, or dizziness.   Inpatient Medications    Scheduled Meds: . carvedilol  6.25 mg Oral BID WC  . enoxaparin (LOVENOX) injection  40 mg Subcutaneous Q24H  . rosuvastatin  40 mg Oral q1800  . sodium chloride flush  3 mL Intravenous Q12H   Continuous Infusions: . sodium chloride 75 mL/hr at 04/25/18 2311   PRN Meds: acetaminophen **OR** acetaminophen, ondansetron **OR** ondansetron (ZOFRAN) IV, senna-docusate   Vital Signs    Vitals:   04/26/18 0721 04/26/18 0839 04/26/18 0840 04/26/18 0841  BP: (!) 166/70 (!) 165/60 (!) 166/90 (!) 166/79  Pulse: (!) 59  62 68  Resp:      Temp: (!) 97.4 F (36.3 C)     TempSrc: Oral     SpO2: 98% 100% 99% 99%  Weight:      Height:        Intake/Output Summary (Last 24 hours) at 04/26/2018 1033 Last data filed at 04/26/2018 0950 Gross per 24 hour  Intake 480 ml  Output 700 ml  Net -220 ml   Filed Weights   04/24/18 1829 04/25/18 0522 04/26/18 0356  Weight: 66.2 kg 65.9 kg 68.8 kg    Telemetry    Atrial paced rhythm - Personally Reviewed  ECG    n/a - Personally Reviewed  Physical Exam   GEN: No acute distress. Dentition poor.  Neck: No JVD. Cardiac: RRR, no murmurs, rubs, or gallops.  Respiratory: Clear to auscultation bilaterally.  GI: Soft, nontender, non-distended.   MS: No edema; No deformity. Neuro:  Alert and oriented x 3; Nonfocal.  Psych: Normal affect.  Labs    Chemistry Recent Labs  Lab 04/24/18 1526 04/25/18 0543  NA 139 137  K 3.1* 3.7  CL 107 110  CO2 19* 18*  GLUCOSE 198* 132*  BUN 24* 24*  CREATININE 0.92 0.75  CALCIUM 8.3* 8.4*  GFRNONAA >60 >60  GFRAA >60 >60  ANIONGAP 13 9     Hematology Recent Labs  Lab 04/24/18 1526   WBC 9.7  RBC 4.53  HGB 13.1  HCT 39.8  MCV 87.9  MCH 28.9  MCHC 32.9  RDW 12.5  PLT 309    Cardiac Enzymes Recent Labs  Lab 04/24/18 1526  TROPONINI <0.03   No results for input(s): TROPIPOC in the last 168 hours.   BNPNo results for input(s): BNP, PROBNP in the last 168 hours.   DDimer No results for input(s): DDIMER in the last 168 hours.   Radiology    Ct Head Wo Contrast  Result Date: 04/24/2018 IMPRESSION: 1. No acute intracranial abnormality. 2. Stable advanced atrophy and white matter disease. 3. 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 4. Ex vacuo dilation of the lateral ventricles, left greater than right. Electronically Signed   By: Marin Roberts M.D.   On: 04/24/2018 18:42   US Carotid Bilateral  Result Date: 04/25/2018 IMPRESSION: Moderate carotid atherosclerosis. Bilateral ICA stenoses within the 50-69% range by ultrasound criteria as above. Patent antegrade vertebral flow bilaterally Electronically Signed   By: Judie Petit.  Shick M.D.   On: 04/25/2018 09:29   Dg Chest Portable 1 View  Result Date: 04/24/2018 IMPRESSION: No active disease. Electronically Signed   By: Richarda Overlie  M.D.   On: 04/24/2018 16:09    Cardiac Studies   2D echo 04/25/2018:   1. The left ventricle has normal systolic function with an ejection fraction of 60-65%. The cavity size was decreased. There is severely increased left ventricular wall thickness. Near cavity obliteration in systole. Left ventricular diastolic Doppler  parameters are consistent with impaired relaxation.  2. The right ventricle has normal systolic function. The cavity was normal. There is no increase in right ventricular wall thickness. Unable to estimate RVSP  3. Left atrial size was mildly dilated.  Patient Profile     73 y.o. 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   Assessment & Plan    1. Syncope: -Felt to be orthostatic hypotension, possible over medication dehydration, and poor food intake -Recheck orthostatic vitals today -Diltiazem held following AM dose on 3/8 with Coreg being decreased to 6.25 mg bid on 3/8 -Remains on IV fluids  2. Second degree AV block: -Status post PPM as above -Notes indicate PPM was interrogated in the ED without significant arrhythmia being noted  3. Carotid artery stenosis: -Outpatient follow up -ASA -Statin  4. HTN: -Medications scaled back as above given poor PO intake and dehydration with orthostasis -Now hypertensive  -Increase Coreg back to 12.5 mg bid, if orthostatic vital signs are improved later today  5. Severe LVH: -Echo as above -Very sensitive to dehydration    For questions or updates, please contact [facility] Please consult [email or web address] for contact info under Cardiology/STEMI.    Signed, Eula Listen, PA-C [facility] Pager: [phone] 04/26/2018, 10:33 AM

## 2018-04-26 NOTE — Discharge Summary (Signed)
[facility] [place] at [facility]   PATIENT NAME: Shannon Chen    MR#:  [id number]  DATE OF BIRTH:  Nov 06, 1945  DATE OF ADMISSION:  04/24/2018   ADMITTING PHYSICIAN: Ihor Austin, MD  DATE OF DISCHARGE:  04/26/18  PRIMARY CARE PHYSICIAN: Gustavo Lah, MD   ADMISSION DIAGNOSIS:   Dehydration [E86.0] Syncope, unspecified syncope type [R55]  DISCHARGE DIAGNOSIS:   Active Problems:   Syncope and collapse   SECONDARY DIAGNOSIS:   Past Medical History:  Diagnosis Date  . Arthritis   . AV block, Mobitz II    a. 09/2016 s/p MDT [id number] Azure XT DR MRI DC PPM  . Carotid artery disease ([name])    a. 09/2016 Carotid U/S: RICA 70-99%, LICA 50-69%;  b. 09/2016 CTA Neck: RICA 60, RCCA 30, LICA 70%, L Vert 60, L Basilar 70.  . Essential hypertension   . Hyperlipidemia   . LVH (left ventricular hypertrophy)    a. 09/2016 Echo: EF 60-65%, no rwma, Gr1 DD, Ca2+ MV annulus; b. 11/2017 Echo: EF 70-75%, no rwma, Gr1 DD, mild AS vs dynamic LVOT obs. Nl RV fxn. Hyperdynamic LVEF w/ sev LVH.    HOSPITAL COURSE:   73 year old female with prior medical history significant for type II AV block status post pacemaker, carotid artery disease, hypertension presents to hospital secondary to a syncopal episode.  1.  Syncope-likely vasovagal/secondary to hypotension -Dehydration and poor oral intake.   Denies any aura -No seizure-like activity -Received IV fluids-was orthostatically hypotensive initially-.  Now with fluids -Cardiology consult appreciated.  Echocardiogram with EF 60 to 65%.  Severely increased LV thickness causing near cavity obliteration in systole secondary to dehydration -Blood pressure meds were held initially.  Coreg being restarted at discharge..  2.  History of AV block status post pacemaker-2 hypertension, Cardizem discontinued. -Coreg restarted and increased to 12.5 mg twice a day at discharge -Pacemaker was interrogated in the ED without significant  events  3.  Carotid artery disease-repeat carotid Doppler showing moderate 50 to 60% carotid artery disease bilaterally.  No change from previous Dopplers. -Continue outpatient follow-up.  On statin.  Ambulated well with physical therapy.  Will be discharged home today   DISCHARGE CONDITIONS:   Guarded  CONSULTS OBTAINED:   Treatment Team:  Antonieta Iba, MD  DRUG ALLERGIES:   No Known Allergies DISCHARGE MEDICATIONS:   Allergies as of 04/26/2018   No Known Allergies     Medication List    STOP taking these medications   diltiazem 120 MG 24 hr capsule Commonly known as:  CARDIZEM CD     TAKE these medications   carvedilol 12.5 MG tablet Commonly known as:  COREG Take 1 tablet (12.5 mg total) by mouth 2 (two) times daily. What changed:    medication strength  how much to take   rosuvastatin 40 MG tablet Commonly known as:  CRESTOR Take 1 tablet (40 mg total) by mouth daily at 6 PM.        DISCHARGE INSTRUCTIONS:   1. PCP f/u in 1-2 weeks 2. Cardiology f/u in 2  Weeks  DIET:   Cardiac diet  ACTIVITY:   Activity as tolerated  OXYGEN:   Home Oxygen: No.  Oxygen Delivery: room air  DISCHARGE LOCATION:   home   If you experience worsening of your admission symptoms, develop shortness of breath, life threatening emergency, suicidal or homicidal thoughts you must seek medical attention immediately by calling 911 or calling your MD immediately  if symptoms less severe.  You Must read complete instructions/literature along with all the possible adverse reactions/side effects for all the Medicines you take and that have been prescribed to you. Take any new Medicines after you have completely understood and accpet all the possible adverse reactions/side effects.   Please note  You were cared for by a hospitalist during your hospital stay. If you have any questions about your discharge medications or the care you received while you were in the  hospital after you are discharged, you can call the unit and asked to speak with the hospitalist on call if the hospitalist that took care of you is not available. Once you are discharged, your primary care physician will handle any further medical issues. Please note that NO REFILLS for any discharge medications will be authorized once you are discharged, as it is imperative that you return to your primary care physician (or establish a relationship with a primary care physician if you do not have one) for your aftercare needs so that they can reassess your need for medications and monitor your lab values.    On the day of Discharge:  VITAL SIGNS:   Blood pressure (!) 166/79, pulse 68, temperature (!) 97.4 F (36.3 C), temperature source Oral, resp. rate 20, height 5\' 6"  (1.676 m), weight 68.8 kg, SpO2 99 %.  PHYSICAL EXAMINATION:     GENERAL:  73 y.o.-year-old patient lying in the bed with no acute distress.  EYES: Pupils equal, round, reactive to light and accommodation. No scleral icterus. Extraocular muscles intact.  HEENT: Head atraumatic, normocephalic. Oropharynx and nasopharynx clear.  NECK:  Supple, no jugular venous distention. No thyroid enlargement, no tenderness.  LUNGS: Normal breath sounds bilaterally, no wheezing, rales,rhonchi or crepitation. No use of accessory muscles of respiration. Decreased bibasilar breath sounds CARDIOVASCULAR: S1, S2 normal. No  rubs, or gallops. 2/6 systolic murmur present ABDOMEN: Soft, nontender, nondistended. Bowel sounds present. No organomegaly or mass.  EXTREMITIES: No pedal edema, cyanosis, or clubbing.  NEUROLOGIC: Cranial nerves II through XII are intact. Muscle strength 5/5 in all extremities. Sensation intact. Gait not checked.  PSYCHIATRIC: The patient is alert and oriented x 3.  SKIN: No obvious rash, lesion, or ulcer.    DATA REVIEW:   CBC Recent Labs  Lab 04/24/18 1526  WBC 9.7  HGB 13.1  HCT 39.8  PLT 309    Chemistries    Recent Labs  Lab 04/24/18 1626 04/25/18 0543  NA  --  137  K  --  3.7  CL  --  110  CO2  --  18*  GLUCOSE  --  132*  BUN  --  24*  CREATININE  --  0.75  CALCIUM  --  8.4*  MG 2.0  --      Microbiology Results  Results for orders placed or performed during the hospital encounter of 10/05/16  MRSA PCR Screening     Status: None   Collection Time: 10/05/16  1:48 PM  Result Value Ref Range Status   MRSA by PCR NEGATIVE NEGATIVE Final    Comment:        The GeneXpert MRSA Assay (FDA approved for NASAL specimens only), is one component of a comprehensive MRSA colonization surveillance program. It is not intended to diagnose MRSA infection nor to guide or monitor treatment for MRSA infections.     RADIOLOGY:  No results found.   Management plans discussed with the patient, family and they are in agreement.  CODE  STATUS:     Code Status Orders  (From admission, onward)         Start     Ordered   04/24/18 1838  Full code  Continuous     04/24/18 1837        Code Status History    Date Active Date Inactive Code Status Order ID Comments User Context   11/17/2017 1653 11/20/2017 1514 Full Code [id number]  Milagros Loll, MD ED   10/05/2016 1444 10/10/2016 0233 Full Code [id number]  Jodelle Gross, NP Inpatient   10/03/2016 1820 10/05/2016 1342 Full Code [id number]  Enid Baas, MD ED      TOTAL TIME TAKING CARE OF THIS PATIENT: 38 minutes.    Enid Baas M.D on 04/26/2018 at 2:10 PM  Between 7am to 6pm - Pager - [phone]  After 6pm go to [email or web address] - Social research officer, government  [facility] [place] [facility]  Office  [phone]  CC: Primary care physician; Gustavo Lah, MD   Note: This dictation was prepared with Dragon dictation along with smaller phrase technology. Any transcriptional errors that result from this process are unintentional.

## 2018-04-26 NOTE — Progress Notes (Signed)
Pt to be discharged to home today. (2) iv's d/c'd and tele removed. disch instructions and taxi voucher given to pt. Awaiting [employer] taxi.

## 2018-04-27 NOTE — Telephone Encounter (Signed)
Patient is scheduled for 05/20/18 at 9 am with Brion Aliment, NP.  1st attempt.  No answer. Left message to call back.

## 2018-04-27 NOTE — Telephone Encounter (Signed)
lmov to see sooner .  °

## 2018-04-28 NOTE — Telephone Encounter (Signed)
2nd attempt- TCM  I attempted to call the patient at her home #. No answer & no voice mail set up.  I attempted to call the cell # listed, which I think is for her nephew. I left a message at that # to have the patient please call back.

## 2018-04-29 NOTE — Telephone Encounter (Signed)
3rd TCM attempt.  No answer or VM on home number listed.  No answer. Left message to call back on mobile number listed.

## 2018-05-18 ENCOUNTER — Telehealth: Payer: Self-pay

## 2018-05-18 NOTE — Telephone Encounter (Signed)
Call attempted to reschedule patient to a telephone or video visit instead of a face to face visit with Ward Givens, NP due to current clinic policies related to COVID19 precautions. Left voicemail message on cell requesting to have patient call back to discuss. Also tried home number but unable to leave a message because voice mailbox has not been set up yet.

## 2018-05-19 NOTE — Telephone Encounter (Signed)
Attempted to call patient. LMTCB 05/19/2018   

## 2018-05-20 ENCOUNTER — Ambulatory Visit: Payer: Self-pay | Admitting: Nurse Practitioner

## 2018-05-24 NOTE — Telephone Encounter (Signed)
Attempted to call patient. LMTCB 05/24/2018

## 2018-05-27 NOTE — Telephone Encounter (Signed)
Tried to call patient to see If she would be willing to do a video visit.  No answer. Vm was full.

## 2018-06-08 NOTE — Telephone Encounter (Signed)
Recall placed for 09/18/2018

## 2018-06-10 ENCOUNTER — Telehealth: Payer: Self-pay | Admitting: *Deleted

## 2018-06-10 NOTE — Telephone Encounter (Signed)
LM with family member who reports pt is not available. Requested that pt call the DC back about setting up [facility] monitor. Monitor has not transmitted since 10/08/16, remote appointment on 03/09/18 was missed.

## 2018-06-22 NOTE — Telephone Encounter (Signed)
LMOVM for pt's nephew requesting that pt call the DC. Gave direct number for return call.

## 2018-06-23 NOTE — Telephone Encounter (Signed)
LMOVM for pt nephew to return call.

## 2018-06-24 NOTE — Telephone Encounter (Signed)
LMOVM requesting a call back.

## 2018-06-25 NOTE — Telephone Encounter (Signed)
Spoke w/ pt nephew and he stated that they have not seen patient in quit some time. He stated that pt used to live on the premises with them but they do not know where she is at. He stated he would try and get some information and requested that we call back around 4:00 PM.

## 2018-07-06 NOTE — Telephone Encounter (Signed)
LMOVM for Mr. Alessandra Bevels to return call.

## 2018-07-08 ENCOUNTER — Encounter: Payer: Self-pay | Admitting: Cardiology

## 2018-07-08 NOTE — Telephone Encounter (Signed)
LMOVM for pt nephew to return call. Mailing letter w/ instructions attached.

## 2018-07-10 ENCOUNTER — Emergency Department
Admission: EM | Admit: 2018-07-10 | Discharge: 2018-07-10 | Disposition: A | Discharge status: Home / Self Care | Payer: Medicare Other | Attending: Emergency Medicine | Admitting: Emergency Medicine

## 2018-07-10 ENCOUNTER — Other Ambulatory Visit: Payer: Self-pay

## 2018-07-10 DIAGNOSIS — R55 Syncope and collapse: Secondary | ICD-10-CM | POA: Insufficient documentation

## 2018-07-10 DIAGNOSIS — Z950 Presence of cardiac pacemaker: Secondary | ICD-10-CM | POA: Insufficient documentation

## 2018-07-10 DIAGNOSIS — I10 Essential (primary) hypertension: Secondary | ICD-10-CM | POA: Insufficient documentation

## 2018-07-10 DIAGNOSIS — R42 Dizziness and giddiness: Secondary | ICD-10-CM | POA: Insufficient documentation

## 2018-07-10 DIAGNOSIS — E876 Hypokalemia: Secondary | ICD-10-CM | POA: Diagnosis not present

## 2018-07-10 DIAGNOSIS — Z79899 Other long term (current) drug therapy: Secondary | ICD-10-CM | POA: Insufficient documentation

## 2018-07-10 DIAGNOSIS — Z87891 Personal history of nicotine dependence: Secondary | ICD-10-CM | POA: Diagnosis not present

## 2018-07-10 DIAGNOSIS — E860 Dehydration: Secondary | ICD-10-CM | POA: Diagnosis not present

## 2018-07-10 LAB — CBC
HCT: 33.7 % — ABNORMAL LOW (ref 36.0–46.0)
Hemoglobin: 11 g/dL — ABNORMAL LOW (ref 12.0–15.0)
MCH: 28.4 pg (ref 26.0–34.0)
MCHC: 32.6 g/dL (ref 30.0–36.0)
MCV: 87.1 fL (ref 80.0–100.0)
Platelets: 320 10*3/uL (ref 150–400)
RBC: 3.87 MIL/uL (ref 3.87–5.11)
RDW: 13 % (ref 11.5–15.5)
WBC: 9.1 10*3/uL (ref 4.0–10.5)
nRBC: 0 % (ref 0.0–0.2)

## 2018-07-10 LAB — URINALYSIS, COMPLETE (UACMP) WITH MICROSCOPIC
Bacteria, UA: NONE SEEN
Bilirubin Urine: NEGATIVE
Glucose, UA: NEGATIVE mg/dL
Hgb urine dipstick: NEGATIVE
Ketones, ur: NEGATIVE mg/dL
Leukocytes,Ua: NEGATIVE
Nitrite: NEGATIVE
Protein, ur: 30 mg/dL — AB
Specific Gravity, Urine: 1.015 (ref 1.005–1.030)
pH: 6 (ref 5.0–8.0)

## 2018-07-10 LAB — BASIC METABOLIC PANEL
Anion gap: 13 (ref 5–15)
BUN: 21 mg/dL (ref 8–23)
CO2: 22 mmol/L (ref 22–32)
Calcium: 8.3 mg/dL — ABNORMAL LOW (ref 8.9–10.3)
Chloride: 106 mmol/L (ref 98–111)
Creatinine, Ser: 1.04 mg/dL — ABNORMAL HIGH (ref 0.44–1.00)
GFR calc Af Amer: >60 mL/min (ref >60)
GFR calc non Af Amer: 53 mL/min — ABNORMAL LOW (ref >60)
Glucose, Bld: 184 mg/dL — ABNORMAL HIGH (ref 70–99)
Potassium: 2.6 mmol/L — CL (ref 3.5–5.1)
Sodium: 141 mmol/L (ref 135–145)

## 2018-07-10 LAB — MAGNESIUM: Magnesium: 2 mg/dL (ref 1.7–2.4)

## 2018-07-10 MED ORDER — POTASSIUM CHLORIDE CRYS ER 20 MEQ PO TBCR
40.0000 meq | EXTENDED_RELEASE_TABLET | Freq: Once | ORAL | Status: AC
  Administered 2018-07-10: 13:00:00 40 meq via ORAL
  Filled 2018-07-10: qty 2

## 2018-07-10 MED ORDER — POTASSIUM CHLORIDE 10 MEQ/100ML IV SOLN
10.0000 meq | Freq: Once | INTRAVENOUS | Status: AC
  Administered 2018-07-10: 14:00:00 10 meq via INTRAVENOUS
  Filled 2018-07-10: qty 100

## 2018-07-10 MED ORDER — IRON 325 (65 FE) MG PO TABS: 1.0000 | ORAL_TABLET | Freq: Every day | ORAL | 1 refills | Status: DC

## 2018-07-10 MED ORDER — SENNA 8.6 MG PO TABS: 1.0000 | ORAL_TABLET | Freq: Every day | ORAL | 0 refills | Status: DC

## 2018-07-10 MED ORDER — SODIUM CHLORIDE 0.9 % IV BOLUS
1000.0000 mL | Freq: Once | INTRAVENOUS | Status: AC
  Administered 2018-07-10: 13:00:00 1000 mL via INTRAVENOUS

## 2018-07-10 MED ORDER — SODIUM CHLORIDE 0.9% FLUSH
3.0000 mL | Freq: Once | INTRAVENOUS | Status: AC
  Administered 2018-07-10: 3 mL via INTRAVENOUS

## 2018-07-10 NOTE — ED Notes (Signed)
Pt c/o burning with IV site, decreased rate to 43ml/hr on potassium

## 2018-07-10 NOTE — ED Triage Notes (Signed)
Ems was called to the [employer] for the patient who was witnessed to be possibly passing out or having a seizure. Pt was weak, pale, and diaphoretic for ems, blood sugar of 200, bp of 106/67. 12 lead for ems noted an st depression in leads 4,5, and 6. Pt c/o weakness. Pt states recent difficulty with eating, states that she is having some emotional issues with her family. Pt speech is slow and somewhat slurred

## 2018-07-10 NOTE — ED Notes (Signed)
Patient ambulated to room commode with a steady gait. 

## 2018-07-10 NOTE — Discharge Instructions (Addendum)
You have been seen today in the Emergency Department (ED)  for syncope (passing out).  Your workup including labs and EKG did not show a cause for your syncope and were overall reassuring.   ° °Your symptoms may be due to dehydration, so it is important that you drink plenty of non-alcoholic fluids. Emotional stress, pain, or overheating--especially if you have been standing--can make you faint. In these cases, fainting is usually not serious. But fainting can be a sign of a more serious problem. Therefore it is imperative that you follow up with your doctor in 1-2 days for further evaluation. ° °When should you call for help?  °Call 911 anytime you think you may need emergency care. For example, call if:  °You have symptoms of a heart problem. These may include:  °Chest pain or pressure.  °Severe trouble breathing.  °A fast or irregular heartbeat.  °Lightheadedness or sudden weakness.  °Coughing up pink, foamy mucus.  °Passing out. °You have symptoms of a stroke. These may include:  °Sudden numbness, tingling, weakness, or loss of movement in your face, arm, or leg, especially on only one side of your body.  °Sudden vision changes.  °Sudden trouble speaking.  °Sudden confusion or trouble understanding simple statements.  °Sudden problems with walking or balance.  °A sudden, severe headache that is different from past headaches. °You passed out (lost consciousness) again. ° °Watch closely for changes in your health, and be sure to contact your doctor if:  °You do not get better as expected. ° ° How can you care for yourself at home?  °Drink plenty of fluids to prevent dehydration. If you have kidney, heart, or liver disease and have to limit fluids, talk with your doctor before you increase your fluid intake. ° ° °

## 2018-07-10 NOTE — ED Notes (Signed)
Attempted to urinate, pt unable to do so. Pt reports that some times after she eats she doesn't use the bathroom for a couple of hours.

## 2018-07-10 NOTE — ED Notes (Signed)
Patient was yelling out, stating that her IV site was swelling. Potassium infusion was finished. IV site was flushed and checked for patency. Blood return noted and site flushed without difficulty. NS infusing.
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
## 2018-07-10 NOTE — ED Notes (Signed)
PT reports feeling better after taking a few bites of food.

## 2018-07-12 LAB — URINE CULTURE

## 2018-07-16 IMAGING — DX DG ORTHOPANTOGRAM /PANORAMIC
2 series · 2 of 2 positions shown · non-contrast
Comparison: None.

CLINICAL DATA: Poor dentition

EXAM:
ORTHOPANTOGRAM/PANORAMIC

[view not recorded (1 of 2)]
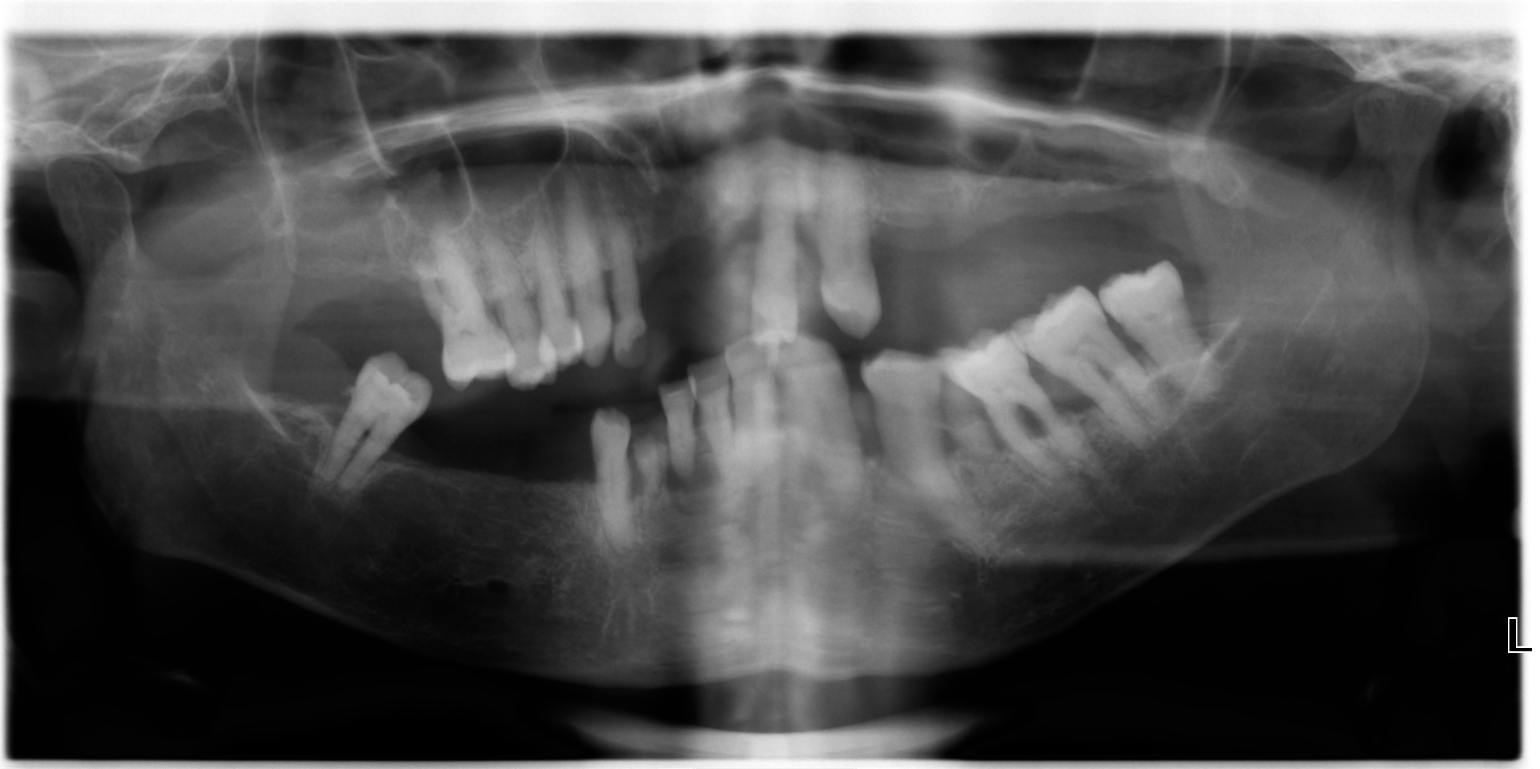

[view not recorded (2 of 2)]
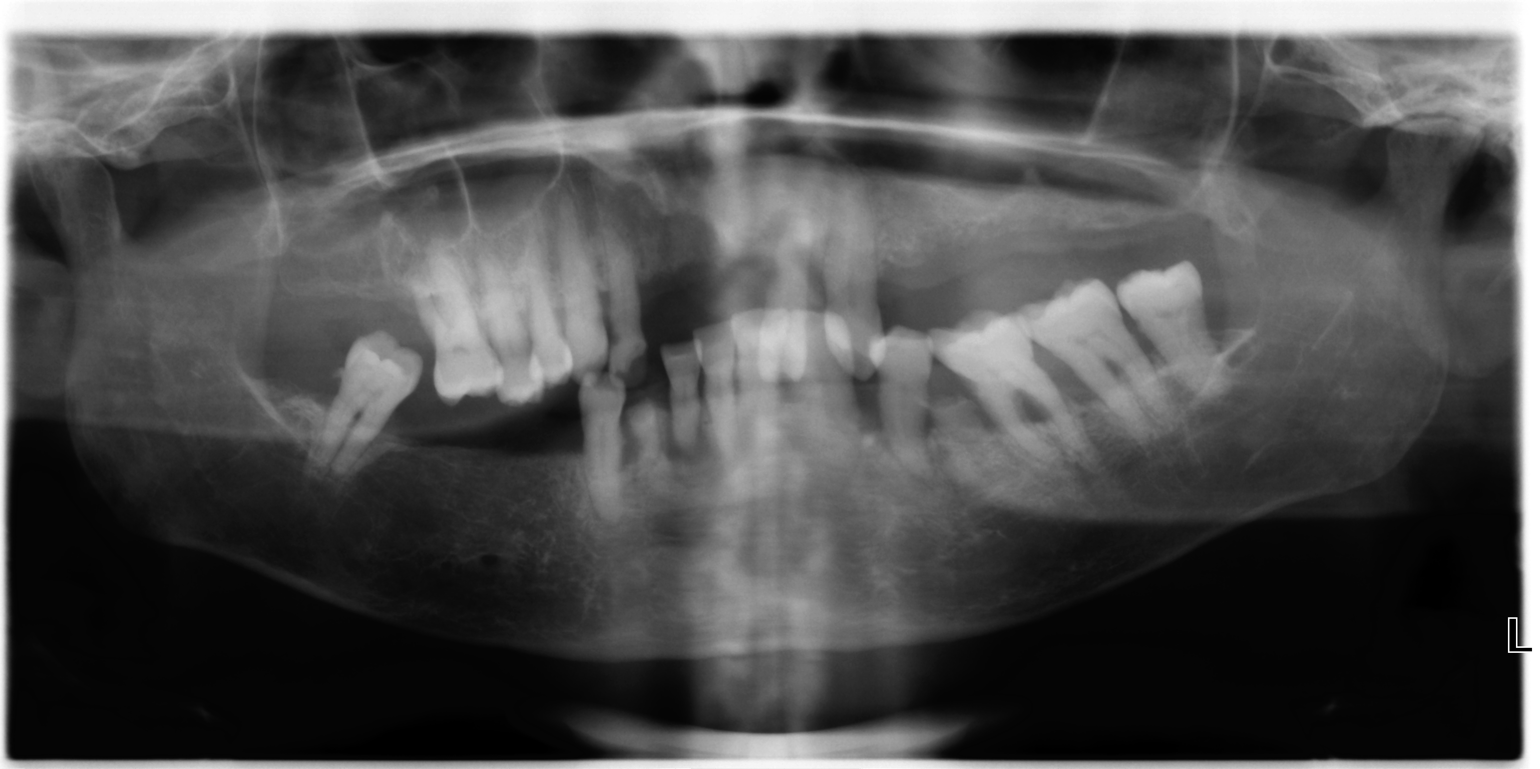

[2 of 2 positions shown; findings below may reference images not displayed]

FINDINGS: Multiple missing teeth. Caries involving teeth numbers 5, 20, and
27. No definite periapical lucency to suggest abscess. Mandible
unremarkable.
IMPRESSION: 1. Multiple missing teeth and caries as above.

## 2018-08-02 ENCOUNTER — Encounter: Payer: Self-pay | Admitting: Cardiology

## 2018-08-02 NOTE — Telephone Encounter (Signed)
Certified letter  

## 2019-08-08 ENCOUNTER — Ambulatory Visit
Admission: RE | Admit: 2019-08-08 | Discharge: 2019-08-08 | Disposition: A | Discharge status: Home / Self Care | Payer: Medicare Other | Source: Ambulatory Visit | Attending: Adult Health | Admitting: Adult Health

## 2019-08-08 ENCOUNTER — Other Ambulatory Visit: Payer: Self-pay

## 2019-08-08 ENCOUNTER — Ambulatory Visit
Admission: RE | Admit: 2019-08-08 | Discharge: 2019-08-08 | Disposition: A | Discharge status: Home / Self Care | Payer: Medicare Other | Attending: Adult Health | Admitting: Adult Health

## 2019-08-08 ENCOUNTER — Other Ambulatory Visit: Payer: Self-pay | Admitting: Adult Health

## 2019-08-08 DIAGNOSIS — Z201 Contact with and (suspected) exposure to tuberculosis: Secondary | ICD-10-CM | POA: Diagnosis present

## 2019-09-13 DIAGNOSIS — I4510 Unspecified right bundle-branch block: Secondary | ICD-10-CM | POA: Insufficient documentation

## 2019-10-03 ENCOUNTER — Other Ambulatory Visit: Payer: Self-pay

## 2019-10-03 ENCOUNTER — Encounter (INDEPENDENT_AMBULATORY_CARE_PROVIDER_SITE_OTHER): Payer: Self-pay | Admitting: Vascular Surgery

## 2019-10-03 ENCOUNTER — Ambulatory Visit (INDEPENDENT_AMBULATORY_CARE_PROVIDER_SITE_OTHER): Payer: Medicare Other | Admitting: Vascular Surgery

## 2019-10-03 VITALS — BP 146/78 | HR 98 | Ht 65.0 in | Wt 154.0 lb

## 2019-10-03 DIAGNOSIS — E785 Hyperlipidemia, unspecified: Secondary | ICD-10-CM

## 2019-10-03 DIAGNOSIS — I10 Essential (primary) hypertension: Secondary | ICD-10-CM

## 2019-10-03 DIAGNOSIS — R55 Syncope and collapse: Secondary | ICD-10-CM

## 2019-10-03 DIAGNOSIS — I6523 Occlusion and stenosis of bilateral carotid arteries: Secondary | ICD-10-CM | POA: Diagnosis not present

## 2019-10-03 NOTE — Progress Notes (Signed)
MRN : [id number]  Shannon Chen is a 74 y.o. (12-09-1945) female who presents with chief complaint of No chief complaint on file. Marland Kitchen  History of Present Illness:   The patient is seen for evaluation of carotid stenosis. The carotid stenosis was identified in the past by duplex ultrasound.  Most recently he had a follow up duplex on 09/13/2019 that showed RICA 80-90% and LICA 50-69%.  He has had syncopal episodes and follows with Dr Darrold Junker.  The patient denies recent amaurosis fugax. There is no recent history of TIA symptoms or focal motor deficits. There is no prior documented CVA.  There is no history of migraine headaches. There is no history of seizures.  The patient is taking enteric-coated aspirin 81 mg daily.  The patient has a history of coronary artery disease, no recent episodes of angina or shortness of breath. The patient denies PAD or claudication symptoms. There is a history of hyperlipidemia which is being treated with a statin.    No outpatient medications have been marked as taking for the 10/03/19 encounter (Appointment) with Gilda Crease, Latina Craver, MD.    Past Medical History:  Diagnosis Date  . Arthritis   . AV block, Mobitz II    a. 09/2016 s/p MDT [id number] Azure XT DR MRI DC PPM  . Carotid artery disease ([name])    a. 09/2016 Carotid U/S: RICA 70-99%, LICA 50-69%;  b. 09/2016 CTA Neck: RICA 60, RCCA 30, LICA 70%, L Vert 60, L Basilar 70.  . Essential hypertension   . Hyperlipidemia   . LVH (left ventricular hypertrophy)    a. 09/2016 Echo: EF 60-65%, no rwma, Gr1 DD, Ca2+ MV annulus; b. 11/2017 Echo: EF 70-75%, no rwma, Gr1 DD, mild AS vs dynamic LVOT obs. Nl RV fxn. Hyperdynamic LVEF w/ sev LVH.    Past Surgical History:  Procedure Laterality Date  . ABDOMINAL HYSTERECTOMY    . PACEMAKER IMPLANT N/A 10/08/2016   Procedure: Pacemaker Implant;  Surgeon: Marinus Maw, MD;  Location: [facility] INVASIVE CV LAB;  Service: Cardiovascular;  Laterality: N/A;    Social  History Social History   Tobacco Use  . Smoking status: Former Smoker    Packs/day: 1.00    Years: 30.00    Pack years: 30.00    Types: Cigarettes  . Smokeless tobacco: Never Used  Vaping Use  . Vaping Use: Never used  Substance Use Topics  . Alcohol use: No  . Drug use: No    Family History Family History  Problem Relation Age of Onset  . Diabetes Mother   . CAD Father   No family history of bleeding/clotting disorders, porphyria or autoimmune disease   No Known Allergies   REVIEW OF SYSTEMS (Negative unless checked)  Constitutional: [] Weight loss  [] Fever  [] Chills Cardiac: [] Chest pain   [] Chest pressure   [] Palpitations   [] Shortness of breath when laying flat   [] Shortness of breath with exertion. Vascular:  [] Pain in legs with walking   [] Pain in legs at rest  [] History of DVT   [] Phlebitis   [] Swelling in legs   [] Varicose veins   [] Non-healing ulcers Pulmonary:   [] Uses home oxygen   [] Productive cough   [] Hemoptysis   [] Wheeze  [] COPD   [] Asthma Neurologic:  [x] Dizziness   [] Seizures   [] History of stroke   [] History of TIA  [] Aphasia   [] Vissual changes   [] Weakness or numbness in arm   [] Weakness or numbness in leg Musculoskeletal:   [] Joint  swelling   [] Joint pain   [] Low back pain Hematologic:  [] Easy bruising  [] Easy bleeding   [] Hypercoagulable state   [] Anemic Gastrointestinal:  [] Diarrhea   [] Vomiting  [] Gastroesophageal reflux/heartburn   [] Difficulty swallowing. Genitourinary:  [] Chronic kidney disease   [] Difficult urination  [] Frequent urination   [] Blood in urine Skin:  [] Rashes   [] Ulcers  Psychological:  [] History of anxiety   []  History of major depression.  Physical Examination  There were no vitals filed for this visit. There is no height or weight on file to calculate BMI. Gen: WD/WN, NAD Head: [place]/AT, No temporalis wasting.  Ear/Nose/Throat: Hearing grossly intact, nares w/o erythema or drainage, poor dentition Eyes: PER, EOMI, sclera  nonicteric.  Neck: Supple, no masses.  No bruit or JVD.  Pulmonary:  Good air movement, clear to auscultation bilaterally, no use of accessory muscles.  Cardiac: RRR, normal S1, S2, no Murmurs. Vascular: carotid bruit bilaterally Vessel Right Left  Radial Palpable Palpable  Carotid Palpable Palpable  Gastrointestinal: soft, non-distended. No guarding/no peritoneal signs.  Musculoskeletal: M/S 5/5 throughout.  No deformity or atrophy.  Neurologic: CN 2-12 intact. Pain and light touch intact in extremities.  Symmetrical.  Speech is fluent. Motor exam as listed above. Psychiatric: Judgment intact, Mood & affect appropriate for pt's clinical situation. Dermatologic: No rashes or ulcers noted.  No changes consistent with cellulitis.   CBC Lab Results  Component Value Date   WBC 9.1 07/10/2018   HGB 11.0 (L) 07/10/2018   HCT 33.7 (L) 07/10/2018   MCV 87.1 07/10/2018   PLT 320 07/10/2018    BMET    Component Value Date/Time   NA 141 07/10/2018 1224   K 2.6 (LL) 07/10/2018 1224   CL 106 07/10/2018 1224   CO2 22 07/10/2018 1224   GLUCOSE 184 (H) 07/10/2018 1224   BUN 21 07/10/2018 1224   CREATININE 1.04 (H) 07/10/2018 1224   CALCIUM 8.3 (L) 07/10/2018 1224   GFRNONAA 53 (L) 07/10/2018 1224   GFRAA >60 07/10/2018 1224   CrCl cannot be calculated (Patient's most recent lab result is older than the maximum 21 days allowed.).  COAG Lab Results  Component Value Date   INR 1.04 10/06/2016    Radiology No results found.   Assessment/Plan 1. Carotid stenosis, bilateral The patient remains asymptomatic with respect to the carotid stenosis.  However, the patient has now progressed and has a lesion the is >70%.  Patient should undergo CT angiography of the carotid arteries to define the degree of stenosis of the internal carotid arteries bilaterally and the anatomic suitability for surgery vs. intervention.  If the patient does indeed need surgery cardiac clearance will be  required, once cleared the patient will be scheduled for surgery.  The risks, benefits and alternative therapies were reviewed in detail with the patient.  All questions were answered.  The patient agrees to proceed with imaging.  Continue antiplatelet therapy as prescribed. Continue management of CAD, HTN and Hyperlipidemia. Healthy heart diet, encouraged exercise at least 4 times per week.   - CT ANGIO NECK W OR WO CONTRAST; Future  2. Essential hypertension Continue antihypertensive medications as already ordered, these medications have been reviewed and there are no changes at this time.   3. Dyslipidemia Continue statin as ordered and reviewed, no changes at this time   4. Syncope, unspecified syncope type Possibly secondary to her carotid stenosis    , MD  10/03/2019 9:26 AM

## 2019-10-12 ENCOUNTER — Ambulatory Visit
Admission: RE | Admit: 2019-10-12 | Discharge: 2019-10-12 | Disposition: A | Discharge status: Home / Self Care | Payer: Medicare Other | Source: Ambulatory Visit | Attending: Vascular Surgery | Admitting: Vascular Surgery

## 2019-10-12 ENCOUNTER — Other Ambulatory Visit: Payer: Self-pay

## 2019-10-12 DIAGNOSIS — I6523 Occlusion and stenosis of bilateral carotid arteries: Secondary | ICD-10-CM | POA: Diagnosis not present

## 2019-10-12 LAB — POCT I-STAT CREATININE: Creatinine, Ser: 1.2 mg/dL — ABNORMAL HIGH (ref 0.44–1.00)

## 2019-10-12 MED ORDER — IOHEXOL 350 MG/ML SOLN
75.0000 mL | Freq: Once | INTRAVENOUS | Status: AC | PRN
  Administered 2019-10-12: 75 mL via INTRAVENOUS

## 2019-10-13 ENCOUNTER — Encounter (INDEPENDENT_AMBULATORY_CARE_PROVIDER_SITE_OTHER): Payer: Self-pay | Admitting: Vascular Surgery

## 2019-10-13 ENCOUNTER — Ambulatory Visit (INDEPENDENT_AMBULATORY_CARE_PROVIDER_SITE_OTHER): Payer: Medicare Other | Admitting: Vascular Surgery

## 2019-10-13 VITALS — BP 130/74 | HR 96 | Resp 16 | Wt 153.8 lb

## 2019-10-13 DIAGNOSIS — E785 Hyperlipidemia, unspecified: Secondary | ICD-10-CM

## 2019-10-13 DIAGNOSIS — I6523 Occlusion and stenosis of bilateral carotid arteries: Secondary | ICD-10-CM

## 2019-10-13 DIAGNOSIS — R55 Syncope and collapse: Secondary | ICD-10-CM | POA: Diagnosis not present

## 2019-10-13 DIAGNOSIS — I10 Essential (primary) hypertension: Secondary | ICD-10-CM

## 2019-10-13 NOTE — Progress Notes (Signed)
MRN : [id number]  Shannon Chen is a 74 y.o. (1945/09/20) female who presents with chief complaint of No chief complaint on file. Marland Kitchen  History of Present Illness:   The patient is seen for follow up evaluation of carotid stenosis status post CT angiogram. CT scan was done 10/12/2019. Patient reports that the test went well with no problems or complications.   The patient denies interval amaurosis fugax. There is no recent or interval TIA symptoms or focal motor deficits. There is no prior documented CVA.  The patient is taking enteric-coated aspirin 81 mg daily.  There is no history of migraine headaches. There is no history of seizures.  The patient has a history of coronary artery disease, no recent episodes of angina or shortness of breath. The patient denies PAD or claudication symptoms. There is a history of hyperlipidemia which is being treated with a statin.    CT angiogram is reviewed by me personally and shows >80% RICA and 70% LICA stenosis. The right lesion is heavily calcified.  I do not concur with the radiologist, I believe that this is an significant under read of the lesion  No outpatient medications have been marked as taking for the 10/13/19 encounter (Appointment) with Gilda Crease, Latina Craver, MD.    Past Medical History:  Diagnosis Date  . Arthritis   . AV block, Mobitz II    a. 09/2016 s/p MDT [id number] Azure XT DR MRI DC PPM  . Carotid artery disease ([name])    a. 09/2016 Carotid U/S: RICA 70-99%, LICA 50-69%;  b. 09/2016 CTA Neck: RICA 60, RCCA 30, LICA 70%, L Vert 60, L Basilar 70.  . Essential hypertension   . Hyperlipidemia   . LVH (left ventricular hypertrophy)    a. 09/2016 Echo: EF 60-65%, no rwma, Gr1 DD, Ca2+ MV annulus; b. 11/2017 Echo: EF 70-75%, no rwma, Gr1 DD, mild AS vs dynamic LVOT obs. Nl RV fxn. Hyperdynamic LVEF w/ sev LVH.    Past Surgical History:  Procedure Laterality Date  . ABDOMINAL HYSTERECTOMY    . PACEMAKER IMPLANT N/A 10/08/2016    Procedure: Pacemaker Implant;  Surgeon: Marinus Maw, MD;  Location: [facility] INVASIVE CV LAB;  Service: Cardiovascular;  Laterality: N/A;    Social History Social History   Tobacco Use  . Smoking status: Former Smoker    Packs/day: 1.00    Years: 30.00    Pack years: 30.00    Types: Cigarettes  . Smokeless tobacco: Never Used  Vaping Use  . Vaping Use: Never used  Substance Use Topics  . Alcohol use: No  . Drug use: No    Family History Family History  Problem Relation Age of Onset  . Diabetes Mother   . CAD Father     No Known Allergies   REVIEW OF SYSTEMS (Negative unless checked)  Constitutional: [] Weight loss  [] Fever  [] Chills Cardiac: [] Chest pain   [] Chest pressure   [] Palpitations   [] Shortness of breath when laying flat   [] Shortness of breath with exertion. Vascular:  [] Pain in legs with walking   [] Pain in legs at rest  [] History of DVT   [] Phlebitis   [] Swelling in legs   [] Varicose veins   [] Non-healing ulcers Pulmonary:   [] Uses home oxygen   [] Productive cough   [] Hemoptysis   [] Wheeze  [] COPD   [] Asthma Neurologic:  [x] Dizziness   [] Seizures   [] History of stroke   [x] History of TIA  [] Aphasia   [] Vissual changes   []   Weakness or numbness in arm   [] Weakness or numbness in leg Musculoskeletal:   [] Joint swelling   [] Joint pain   [] Low back pain Hematologic:  [] Easy bruising  [] Easy bleeding   [] Hypercoagulable state   [] Anemic Gastrointestinal:  [] Diarrhea   [] Vomiting  [] Gastroesophageal reflux/heartburn   [] Difficulty swallowing. Genitourinary:  [] Chronic kidney disease   [] Difficult urination  [] Frequent urination   [] Blood in urine Skin:  [] Rashes   [] Ulcers  Psychological:  [] History of anxiety   []  History of major depression.  Physical Examination  There were no vitals filed for this visit. There is no height or weight on file to calculate BMI. Gen: WD/WN, NAD Head: [place]/AT, No temporalis wasting.  Ear/Nose/Throat: Hearing grossly intact, nares w/o  erythema or drainage Eyes: PER, EOMI, sclera nonicteric.  Neck: Supple, no large masses.   Pulmonary:  Good air movement, no audible wheezing bilaterally, no use of accessory muscles.  Cardiac: RRR, no JVD Vascular: bilateral carotid bruits Vessel Right Left  Radial Palpable Palpable  Brachial Palpable Palpable  Carotid Palpable Palpable  Gastrointestinal: Non-distended. No guarding/no peritoneal signs.  Musculoskeletal: M/S 5/5 throughout.  No deformity or atrophy.  Neurologic: CN 2-12 intact. Symmetrical.  Speech is fluent. Motor exam as listed above. Psychiatric: Judgment intact, Mood & affect appropriate for pt's clinical situation. Dermatologic: No rashes or ulcers noted.  No changes consistent with cellulitis.   CBC Lab Results  Component Value Date   WBC 9.1 07/10/2018   HGB 11.0 (L) 07/10/2018   HCT 33.7 (L) 07/10/2018   MCV 87.1 07/10/2018   PLT 320 07/10/2018    BMET    Component Value Date/Time   NA 141 07/10/2018 1224   K 2.6 (LL) 07/10/2018 1224   CL 106 07/10/2018 1224   CO2 22 07/10/2018 1224   GLUCOSE 184 (H) 07/10/2018 1224   BUN 21 07/10/2018 1224   CREATININE 1.20 (H) 10/12/2019 1113   CALCIUM 8.3 (L) 07/10/2018 1224   GFRNONAA 53 (L) 07/10/2018 1224   GFRAA >60 07/10/2018 1224   Estimated Creatinine Clearance: 39.2 mL/min (A) (by C-G formula based on SCr of 1.2 mg/dL (H)).  COAG Lab Results  Component Value Date   INR 1.04 10/06/2016    Radiology CT ANGIO NECK W OR WO CONTRAST  Result Date: 10/12/2019 CLINICAL DATA:  Carotid stenosis.  Worsening by previous ultrasound. EXAM: CT ANGIOGRAPHY NECK TECHNIQUE: Multidetector CT imaging of the neck was performed using the standard protocol during bolus administration of intravenous contrast. Multiplanar CT image reconstructions and MIPs were obtained to evaluate the vascular anatomy. Carotid stenosis measurements (when applicable) are obtained utilizing NASCET criteria, using the distal internal  carotid diameter as the denominator. CONTRAST:  66mL OMNIPAQUE IOHEXOL 350 MG/ML SOLN COMPARISON:  Ultrasound 04/25/2018.  CT angiography 10/08/2016. FINDINGS: Aortic arch: Pronounced aortic atherosclerosis with calcified and soft plaque and wall irregularity. No sign of dissection. Right carotid system: Innominate artery is widely patent. Common carotid artery is tortuous and shows soft and calcified plaque. 30% stenosis 4 cm proximal to the bifurcation as seen previously. Complex soft and calcified plaque at the carotid bifurcation and ICA bulb. Minimal diameter at the distal bulb is 1.5 mm. Compared to an expected distal cervical ICA diameter of 5 mm, this indicates a 70% stenosis. Actual diameter of the distal internal carotid artery measures only 4 mm, therefore the stenosis is only 60% using NASCET criteria. The morphology of the stenosis is self appears quite similar to the study of 2018. Left carotid system:  There is beam hardening artifact at the left common carotid artery origin. The common carotid artery shows soft and calcified plaque but no stenosis proximal to the bifurcation. Complex soft and calcified plaque at the carotid bifurcation and ICA bulb. Minimal diameter is 1.8-2 mm. Compared to a more distal cervical ICA diameter of 5 mm, this indicates a 60-70% stenosis. Both internal carotid arteries are patent through the skull base and siphon regions. Calcified plaque in both carotid siphon regions but without stenosis greater than 30% suspected. Vertebral arteries: The think proximal right vertebral artery is occluded and reconstituted in the cervical region by cervical collaterals. This is a severely disease vessel with multiple serial stenoses. The vessel does show flow through the foramen magnum. The right V4 segment is severely diseased in is occluded or nearly occluded. Flow is present within right PICA. Left subclavian artery shows stenosis at its origin estimated at 50%. Beam hardening  artifact prevents accurate evaluation of the next 2-3 cm of the vessel. The left vertebral artery origin is poorly demonstrated. The vessel does show flow beyond that with multiple serial stenoses through the cervical region. The vessel is patent through the foramen magnum. No vertebral artery V4 stenosis. PICA shows flow. The left vertebral artery give supply to the basilar. 70% stenosis of proximal basilar artery. Skeleton: Ordinary cervical spondylosis and facet osteoarthritis Other neck: No soft tissue mass or lymphadenopathy. Upper chest: No focal or active process. Slightly prominent interstitial markings. IMPRESSION: 1. Advanced aortic atherosclerosis with calcified and soft plaque and wall irregularity. No sign of dissection. 2. 30% stenosis of the right common carotid artery 4 cm proximal to the bifurcation. Complex soft and calcified plaque at the carotid bifurcation and ICA bulb. Minimal diameter at the distal bulb is 1.5 mm, therefore the stenosis is only 60% using NASCET criteria given a distal ICA diameter of 4 mm. The morphology of the stenosis is self appears quite similar to the study of 2018. 3. Complex soft and calcified plaque at the left carotid bifurcation and ICA bulb regions. Minimal diameter at the distal bulb is 1.8-2 mm, therefore the stenosis is 60-70%, considering distal ICA diameter of 5 mm. The morphology of the stenosis it self appears quite similar to the study of 2018. 4. Left vertebral artery origin, proximal left common carotid artery and proximal left subclavian artery obscured by beam hardening artifact. Left vertebral artery is severely disease with multiple serial stenoses but does remain patent to the basilar. 70% stenosis of the basilar artery as shown previously. 5. The proximal right vertebral artery is occluded and reconstituted in the cervical region by cervical collaterals. This is a severely diseased vessel. There is supply to right PICA but no contribution to the  basilar. Aortic Atherosclerosis (ICD10-I70.0). Electronically Signed   By: Paulina Fusi M.D.   On: 10/12/2019 16:19     Assessment/Plan 1. Carotid stenosis, bilateral Recommend:  The patient remains asymptomatic with respect to the carotid stenosis.  However, the patient has now progressed and has a lesion the is >80%.  Patient's CT angiography of the carotid arteries confirms >80% right ICA stenosis.  The anatomical considerations support surgery over stenting.  This was discussed in detail with the patient.  The patient does indeed need surgery, therefore, cardiac clearance will be arranged. Once cleared the patient will be scheduled for surgery.  The risks, benefits and alternative therapies were reviewed in detail with the patient.  All questions were answered.  The patient agrees to proceed with surgery  of the right carotid artery.  Continue antiplatelet therapy as prescribed. Continue management of CAD, HTN and Hyperlipidemia. Healthy heart diet, encouraged exercise at least 4 times per week.    2. Syncope and collapse See #1  3. Essential hypertension Continue antihypertensive medications as already ordered, these medications have been reviewed and there are no changes at this time.   4. Dyslipidemia Continue statin as ordered and reviewed, no changes at this time     Levora DredgeGregory Kwali Wrinkle, MD  10/13/2019 8:51 AM
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
## 2019-10-19 ENCOUNTER — Other Ambulatory Visit: Payer: Self-pay

## 2019-10-19 ENCOUNTER — Encounter: Payer: Self-pay | Admitting: Cardiovascular Disease

## 2019-10-19 ENCOUNTER — Ambulatory Visit (INDEPENDENT_AMBULATORY_CARE_PROVIDER_SITE_OTHER): Payer: Medicare Other | Admitting: Cardiovascular Disease

## 2019-10-19 VITALS — BP 140/80 | HR 82 | Ht 65.5 in | Wt 155.0 lb

## 2019-10-19 DIAGNOSIS — I739 Peripheral vascular disease, unspecified: Secondary | ICD-10-CM

## 2019-10-19 DIAGNOSIS — I6523 Occlusion and stenosis of bilateral carotid arteries: Secondary | ICD-10-CM

## 2019-10-19 DIAGNOSIS — I441 Atrioventricular block, second degree: Secondary | ICD-10-CM | POA: Diagnosis not present

## 2019-10-19 DIAGNOSIS — R0602 Shortness of breath: Secondary | ICD-10-CM

## 2019-10-19 DIAGNOSIS — I10 Essential (primary) hypertension: Secondary | ICD-10-CM

## 2019-10-19 DIAGNOSIS — R072 Precordial pain: Secondary | ICD-10-CM

## 2019-10-19 DIAGNOSIS — E782 Mixed hyperlipidemia: Secondary | ICD-10-CM

## 2019-10-19 DIAGNOSIS — Z950 Presence of cardiac pacemaker: Secondary | ICD-10-CM

## 2019-10-19 MED ORDER — ROSUVASTATIN CALCIUM 40 MG PO TABS: 40.0000 mg | ORAL_TABLET | Freq: Every day | ORAL | 3 refills | Status: DC

## 2019-10-19 NOTE — Patient Instructions (Addendum)
Medication Instructions:  - Your physician has recommended you make the following change in your medication:   1) START crestor/rosuvastatin 40 mg- take 1 tablet by mouth once daily for cholesterol  If you need a refill on your cardiac medications before your next appointment, please call your pharmacy.    Lab work: No new labs needed   If you have labs (blood work) drawn today and your tests are completely normal, you will receive your results only by: Shannon Chen MyChart Message (if you have MyChart) OR . A paper copy in the mail If you have any lab test that is abnormal or we need to change your treatment, we will call you to review the results.   Testing/Procedures:  1) Your physician has requested that you have a lexiscan myoview- next Tuesday (Cardiac Nuclear Scan)  [facility] MYOVIEW  Your caregiver has ordered a Stress Test with nuclear imaging. The purpose of this test is to evaluate the blood supply to your heart muscle. This procedure is referred to as a "Non-Invasive Stress Test." This is because other than having an IV started in your vein, nothing is inserted or "invades" your body. Cardiac stress tests are done to find areas of poor blood flow to the heart by determining the extent of coronary artery disease (CAD). Some patients exercise on a treadmill, which naturally increases the blood flow to your heart, while others who are  unable to walk on a treadmill due to physical limitations have a pharmacologic/chemical stress agent called Lexiscan . This medicine will mimic walking on a treadmill by temporarily increasing your coronary blood flow.   Please note: these test may take anywhere between 2-4 hours to complete  PLEASE REPORT TO [facility] MEDICAL MALL ENTRANCE  THE VOLUNTEERS AT THE FIRST DESK WILL DIRECT YOU WHERE TO GO  Date of Procedure:_____________________________________  Arrival Time for Procedure:______________________________  Instructions regarding medication:     _X___:  Hold lasix (furosemide) the morning of your test  PLEASE NOTIFY THE OFFICE AT LEAST 24 HOURS IN ADVANCE IF YOU ARE UNABLE TO KEEP YOUR APPOINTMENT.  [phone] AND  PLEASE NOTIFY NUCLEAR MEDICINE AT [facility] AT LEAST 24 HOURS IN ADVANCE IF YOU ARE UNABLE TO KEEP YOUR APPOINTMENT. [phone]  How to prepare for your Myoview test:  1. Do not eat or drink after midnight 2. No caffeine for 24 hours prior to test 3. No smoking 24 hours prior to test. 4. Your medication may be taken with water.  If your doctor stopped a medication because of this test, do not take that medication. 5. Ladies, please do not wear dresses.  Skirts or pants are appropriate. Please wear a short sleeve shirt. 6. No perfume, cologne or lotion. 7. Wear comfortable walking shoes. No heels!       Follow-Up: At [facility], you and your health needs are our priority.  As part of our continuing mission to provide you with exceptional heart care, we have created designated Provider Care Teams.  These Care Teams include your primary Cardiologist (physician) and Advanced Practice Providers (APPs -  Physician Assistants and Nurse Practitioners) who all work together to provide you with the care you need, when you need it.  Shannon Chen You will need a follow up appointment with Dr. [name]- next available for re-evaluation/ care of your pacemaker.  . You will need a follow up appointment in 12 months with Dr. [name]   . Providers on your designated Care Team:   . Nicolasa Ducking, NP . Ryan  Dunn, PA-C . Marisue Ivan, PA-C  Any Other Special Instructions Will Be Listed Below (If Applicable).  COVID-19 Vaccine Information can be found at: [email or web address] For questions related to vaccine distribution or appointments, please email [email or web address] or call [phone].       Cardiac Nuclear Scan A cardiac nuclear scan is a test that is done to  check the flow of blood to your heart. It is done when you are resting and when you are exercising. The test looks for problems such as:  Not enough blood reaching a portion of the heart.  The heart muscle not working as it should. You may need this test if:  You have heart disease.  You have had lab results that are not normal.  You have had heart surgery or a balloon procedure to open up blocked arteries (angioplasty).  You have chest pain.  You have shortness of breath. In this test, a special dye (tracer) is put into your bloodstream. The tracer will travel to your heart. A camera will then take pictures of your heart to see how the tracer moves through your heart. This test is usually done at a hospital and takes 2-4 hours. Tell a doctor about:  Any allergies you have.  All medicines you are taking, including vitamins, herbs, eye drops, creams, and over-the-counter medicines.  Any problems you or family members have had with anesthetic medicines.  Any blood disorders you have.  Any surgeries you have had.  Any medical conditions you have.  Whether you are pregnant or may be pregnant. What are the risks? Generally, this is a safe test. However, problems may occur, such as:  Serious chest pain and heart attack. This is only a risk if the stress portion of the test is done.  Rapid heartbeat.  A feeling of warmth in your chest. This feeling usually does not last long.  Allergic reaction to the tracer. What happens before the test?  Ask your doctor about changing or stopping your normal medicines. This is important.  Follow instructions from your doctor about what you cannot eat or drink.  Remove your jewelry on the day of the test. What happens during the test?  An IV tube will be inserted into one of your veins.  Your doctor will give you a small amount of tracer through the IV tube.  You will wait for 20-40 minutes while the tracer moves through your  bloodstream.  Your heart will be monitored with an electrocardiogram (ECG).  You will lie down on an exam table.  Pictures of your heart will be taken for about 15-20 minutes.  You may also have a stress test. For this test, one of these things may be done: ? You will be asked to exercise on a treadmill or a stationary bike. ? You will be given medicines that will make your heart work harder. This is done if you are unable to exercise.  When blood flow to your heart has peaked, a tracer will again be given through the IV tube.  After 20-40 minutes, you will get back on the exam table. More pictures will be taken of your heart.  Depending on the tracer that is used, more pictures may need to be taken 3-4 hours later.  Your IV tube will be removed when the test is over. The test may vary among doctors and hospitals. What happens after the test?  Ask your doctor: ? Whether you can return to your normal schedule, including diet, activities,  and medicines. ? Whether you should drink more fluids. This will help to remove the tracer from your body. Drink enough fluid to keep your pee (urine) pale yellow.  Ask your doctor, or the department that is doing the test: ? When will my results be ready? ? How will I get my results? Summary  A cardiac nuclear scan is a test that is done to check the flow of blood to your heart.  Tell your doctor whether you are pregnant or may be pregnant.  Before the test, ask your doctor about changing or stopping your normal medicines. This is important.  Ask your doctor whether you can return to your normal activities. You may be asked to drink more fluids. This information is not intended to replace advice given to you by your health care provider. Make sure you discuss any questions you have with your health care provider. Document Revised: 05/26/2018 Document Reviewed: 07/20/2017 Elsevier Patient Education  2020 [employer].

## 2019-10-20 ENCOUNTER — Telehealth: Payer: Self-pay | Admitting: Cardiovascular Disease

## 2019-10-20 NOTE — Telephone Encounter (Signed)
Please call to discuss if the time of the medication being administered can be changed.

## 2019-10-20 NOTE — Telephone Encounter (Signed)
Spoke with patient who wanted to see if the Crestor could be moved to the morning pill pack instead of at 8 pm. Patient does not take any other evening meds and it would make it easier since she lives in a group home.  Called Greggory Stallion at [facility] drug who confirmed that would be ok for them to switch the timing to the morning pill pack. He will make arrangements to do so.  Notified patient and caregiver that the changes are being made. They were appreciative.

## 2019-10-26 ENCOUNTER — Encounter
Admission: RE | Admit: 2019-10-26 | Discharge: 2019-10-26 | Disposition: A | Discharge status: Home / Self Care | Payer: Medicare Other | Source: Ambulatory Visit | Attending: Cardiovascular Disease | Admitting: Cardiovascular Disease

## 2019-10-26 ENCOUNTER — Other Ambulatory Visit: Payer: Self-pay

## 2019-10-26 DIAGNOSIS — R072 Precordial pain: Secondary | ICD-10-CM | POA: Insufficient documentation

## 2019-10-26 DIAGNOSIS — R0602 Shortness of breath: Secondary | ICD-10-CM | POA: Insufficient documentation

## 2019-10-26 LAB — NM MYOCAR MULTI W/SPECT W/WALL MOTION / EF
Estimated workload: 1 METS
Exercise duration (min): 0 min
Exercise duration (sec): 0 s
LV dias vol: 32 mL (ref 46–106)
LV sys vol: 7 mL
MPHR: 144 {beats}/min
Peak HR: 92 {beats}/min
Percent HR: 63 %
Rest HR: 75 {beats}/min
SDS: 0
SRS: 0
SSS: 1
TID: 1.07

## 2019-10-26 MED ORDER — REGADENOSON 0.4 MG/5ML IV SOLN
0.4000 mg | Freq: Once | INTRAVENOUS | Status: AC
  Administered 2019-10-26: 0.4 mg via INTRAVENOUS

## 2019-10-26 MED ORDER — TECHNETIUM TC 99M TETROFOSMIN IV KIT
10.0000 | PACK | Freq: Once | INTRAVENOUS | Status: AC | PRN
  Administered 2019-10-26: 10.29 via INTRAVENOUS

## 2019-10-26 MED ORDER — TECHNETIUM TC 99M TETROFOSMIN IV KIT
30.0000 | PACK | Freq: Once | INTRAVENOUS | Status: AC | PRN
  Administered 2019-10-26: 29.828 via INTRAVENOUS

## 2019-11-03 ENCOUNTER — Telehealth (INDEPENDENT_AMBULATORY_CARE_PROVIDER_SITE_OTHER): Payer: Self-pay

## 2019-11-03 NOTE — Telephone Encounter (Signed)
I attempted to contact the patient and a message was left for a return call. 

## 2019-11-04 NOTE — Telephone Encounter (Signed)
I spoke with Clydie Braun at the facility [facility] and let her know the patient is scheduled with Dr. Gilda Crease for a RCEA on 11/16/19, pre-op on 11/09/19 at 1:00 pm at the [facility] and covid testing on 11/14/19 between 8-1 pm at the [facility]. Pre-surgical instructions will be faxed to the facility at [facility] to attention Clydie Braun as requested.

## 2019-11-06 ENCOUNTER — Other Ambulatory Visit: Payer: Self-pay

## 2019-11-06 ENCOUNTER — Encounter: Payer: Self-pay | Admitting: Emergency Medicine

## 2019-11-06 ENCOUNTER — Observation Stay
Admission: EM | Admit: 2019-11-06 | Discharge: 2019-11-08 | Disposition: A | Discharge status: Home / Self Care | Payer: Medicare Other | Attending: Obstetrics and Gynecology | Admitting: Obstetrics and Gynecology

## 2019-11-06 ENCOUNTER — Emergency Department: Payer: Medicare Other

## 2019-11-06 DIAGNOSIS — Z20822 Contact with and (suspected) exposure to covid-19: Secondary | ICD-10-CM | POA: Insufficient documentation

## 2019-11-06 DIAGNOSIS — R55 Syncope and collapse: Secondary | ICD-10-CM | POA: Diagnosis present

## 2019-11-06 DIAGNOSIS — R2681 Unsteadiness on feet: Secondary | ICD-10-CM | POA: Insufficient documentation

## 2019-11-06 DIAGNOSIS — E876 Hypokalemia: Secondary | ICD-10-CM | POA: Diagnosis present

## 2019-11-06 DIAGNOSIS — N189 Chronic kidney disease, unspecified: Secondary | ICD-10-CM | POA: Diagnosis present

## 2019-11-06 DIAGNOSIS — E785 Hyperlipidemia, unspecified: Secondary | ICD-10-CM | POA: Diagnosis present

## 2019-11-06 DIAGNOSIS — Z79899 Other long term (current) drug therapy: Secondary | ICD-10-CM | POA: Diagnosis not present

## 2019-11-06 DIAGNOSIS — N179 Acute kidney failure, unspecified: Secondary | ICD-10-CM | POA: Diagnosis present

## 2019-11-06 DIAGNOSIS — I441 Atrioventricular block, second degree: Secondary | ICD-10-CM | POA: Diagnosis present

## 2019-11-06 DIAGNOSIS — I4510 Unspecified right bundle-branch block: Secondary | ICD-10-CM | POA: Diagnosis present

## 2019-11-06 DIAGNOSIS — R778 Other specified abnormalities of plasma proteins: Secondary | ICD-10-CM | POA: Diagnosis present

## 2019-11-06 DIAGNOSIS — I10 Essential (primary) hypertension: Secondary | ICD-10-CM | POA: Diagnosis present

## 2019-11-06 DIAGNOSIS — Z950 Presence of cardiac pacemaker: Secondary | ICD-10-CM | POA: Diagnosis present

## 2019-11-06 DIAGNOSIS — Z87891 Personal history of nicotine dependence: Secondary | ICD-10-CM | POA: Insufficient documentation

## 2019-11-06 DIAGNOSIS — I2510 Atherosclerotic heart disease of native coronary artery without angina pectoris: Secondary | ICD-10-CM | POA: Diagnosis not present

## 2019-11-06 DIAGNOSIS — R7989 Other specified abnormal findings of blood chemistry: Secondary | ICD-10-CM | POA: Diagnosis present

## 2019-11-06 LAB — URINALYSIS, COMPLETE (UACMP) WITH MICROSCOPIC
Bilirubin Urine: NEGATIVE
Glucose, UA: NEGATIVE mg/dL
Hgb urine dipstick: NEGATIVE
Ketones, ur: NEGATIVE mg/dL
Leukocytes,Ua: NEGATIVE
Nitrite: NEGATIVE
Protein, ur: 100 mg/dL — AB
Specific Gravity, Urine: 1.012 (ref 1.005–1.030)
pH: 6 (ref 5.0–8.0)

## 2019-11-06 LAB — BASIC METABOLIC PANEL
Anion gap: 15 (ref 5–15)
BUN: 26 mg/dL — ABNORMAL HIGH (ref 8–23)
CO2: 28 mmol/L (ref 22–32)
Calcium: 9.1 mg/dL (ref 8.9–10.3)
Chloride: 99 mmol/L (ref 98–111)
Creatinine, Ser: 1.65 mg/dL — ABNORMAL HIGH (ref 0.44–1.00)
GFR calc Af Amer: 35 mL/min — ABNORMAL LOW (ref >60)
GFR calc non Af Amer: 30 mL/min — ABNORMAL LOW (ref >60)
Glucose, Bld: 113 mg/dL — ABNORMAL HIGH (ref 70–99)
Potassium: 2.6 mmol/L — CL (ref 3.5–5.1)
Sodium: 142 mmol/L (ref 135–145)

## 2019-11-06 LAB — CBC
HCT: 38.4 % (ref 36.0–46.0)
Hemoglobin: 13.1 g/dL (ref 12.0–15.0)
MCH: 29.4 pg (ref 26.0–34.0)
MCHC: 34.1 g/dL (ref 30.0–36.0)
MCV: 86.3 fL (ref 80.0–100.0)
Platelets: 314 10*3/uL (ref 150–400)
RBC: 4.45 MIL/uL (ref 3.87–5.11)
RDW: 11.8 % (ref 11.5–15.5)
WBC: 8.9 10*3/uL (ref 4.0–10.5)
nRBC: 0 % (ref 0.0–0.2)

## 2019-11-06 LAB — TROPONIN I (HIGH SENSITIVITY)
Troponin I (High Sensitivity): 131 ng/L (ref <18)
Troponin I (High Sensitivity): 138 ng/L (ref <18)

## 2019-11-06 LAB — SARS CORONAVIRUS 2 BY RT PCR (HOSPITAL ORDER, PERFORMED IN ~~LOC~~ HOSPITAL LAB): SARS Coronavirus 2: NEGATIVE

## 2019-11-06 LAB — MAGNESIUM: Magnesium: 2.3 mg/dL (ref 1.7–2.4)

## 2019-11-06 MED ORDER — ROSUVASTATIN CALCIUM 20 MG PO TABS
40.0000 mg | ORAL_TABLET | Freq: Every day | ORAL | Status: DC
  Administered 2019-11-07: 40 mg via ORAL
  Filled 2019-11-06 (×2): qty 2
  Filled 2019-11-06 (×2): qty 4

## 2019-11-06 MED ORDER — POTASSIUM CHLORIDE 20 MEQ PO PACK
60.0000 meq | PACK | Freq: Once | ORAL | Status: AC
  Administered 2019-11-06: 60 meq via ORAL
  Filled 2019-11-06: qty 3

## 2019-11-06 MED ORDER — POTASSIUM CHLORIDE CRYS ER 20 MEQ PO TBCR
40.0000 meq | EXTENDED_RELEASE_TABLET | Freq: Once | ORAL | Status: AC
  Administered 2019-11-06: 40 meq via ORAL
  Filled 2019-11-06: qty 2

## 2019-11-06 MED ORDER — SODIUM CHLORIDE 0.9% FLUSH
3.0000 mL | Freq: Two times a day (BID) | INTRAVENOUS | Status: DC
  Administered 2019-11-07 – 2019-11-08 (×4): 3 mL via INTRAVENOUS

## 2019-11-06 MED ORDER — ACETAMINOPHEN 650 MG RE SUPP: 650.0000 mg | Freq: Four times a day (QID) | RECTAL | Status: DC | PRN

## 2019-11-06 MED ORDER — ACETAMINOPHEN 325 MG PO TABS: 650.0000 mg | ORAL_TABLET | Freq: Four times a day (QID) | ORAL | Status: DC | PRN

## 2019-11-06 MED ORDER — ASPIRIN EC 81 MG PO TBEC
81.0000 mg | DELAYED_RELEASE_TABLET | Freq: Every day | ORAL | Status: DC
  Administered 2019-11-07 – 2019-11-08 (×2): 81 mg via ORAL
  Filled 2019-11-06 (×2): qty 1

## 2019-11-06 MED ORDER — HEPARIN SODIUM (PORCINE) 5000 UNIT/ML IJ SOLN
5000.0000 [IU] | Freq: Three times a day (TID) | INTRAMUSCULAR | Status: DC
  Administered 2019-11-06 – 2019-11-08 (×6): 5000 [IU] via SUBCUTANEOUS
  Filled 2019-11-06 (×6): qty 1

## 2019-11-06 MED ORDER — POTASSIUM CHLORIDE 2 MEQ/ML IV SOLN
INTRAVENOUS | Status: DC
  Filled 2019-11-06 (×2): qty 1000

## 2019-11-06 MED ORDER — POTASSIUM CHLORIDE 10 MEQ/100ML IV SOLN
10.0000 meq | INTRAVENOUS | Status: AC
  Administered 2019-11-06 (×3): 10 meq via INTRAVENOUS
  Filled 2019-11-06 (×2): qty 100

## 2019-11-06 MED ORDER — ASPIRIN 81 MG PO CHEW
324.0000 mg | CHEWABLE_TABLET | Freq: Once | ORAL | Status: AC
  Administered 2019-11-06: 324 mg via ORAL
  Filled 2019-11-06: qty 4

## 2019-11-06 NOTE — ED Triage Notes (Signed)
Pt was in church and per EMS had syncopal episode.  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  Unlabored. VSS at this time.  Pt felt weak and hot prior to passing out.  Pt is oriented to person place and time.
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
## 2019-11-06 NOTE — ED Notes (Signed)
Attempted to call report

## 2019-11-06 NOTE — ED Notes (Signed)
IV attempt x1

## 2019-11-06 NOTE — ED Notes (Signed)
Helped pt to there bathroom. Pts has no other needs at this time.

## 2019-11-06 NOTE — H&P (Signed)
History and Physical    Shantoya M Racey ZOX:[id number] DOB: Nov 02, 1945 DOA: 11/06/2019  PCP: Franciso Bendogers, Jennifer B, NP  [facility]  I have personally briefly reviewed patient's old medical [facility]  Chief Complaint: Syncopal episode  HPI: Shannon JumperVinnie M Chen is a 74 y.o. 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  Patient does not recall the events but states that she last remembers sitting down in church and feeling hot and weak.  The next thing she knows she is surrounded by people telling her that she lost consciousness and slumped over without falling to the ground.  She was seemingly unconscious for less than a minute before returning back to her baseline.  She does state that she has had decreased appetite and not keeping up with her diet recently.  She noticed becoming lightheaded easily when she stands up at home.  She says one of her blood pressure medications was decreased in dosage due to dizziness.  She has had recent cardiac and vascular work-up for upcoming planned right-sided endarterectomy currently scheduled for 11/16/2019. Work-up included nuclear stress test 10/26/2019 which was a low risk study.  Echocardiogram 08/29/2019 showed EF 54%, LVH, LV diastolic dysfunction, tricuspid valve regurgitation, otherwise normal study.  ED Course:  Initial vitals showed BP 138/76, pulse 77, RR 16, temp 98.4 Fahrenheit, SPO2 98% on room air.  Labs show potassium 3.6, magnesium 2.3, sodium 142, bicarb 28, BUN 26, creatinine 1.65, serum glucose 113, WBC 8.9, hemoglobin 13.1, platelets 314,000, high-sensitivity troponin I 131 > 138.  Urinalysis shows negative  nitrates, negative leukocytes, 0-5 RBC/hpf, 6-10 WBC/hpf, rare bacteria microscopy.  SARS-CoV-2 PCR's obtained and pending.  CT head without contrast was negative for acute intracranial abnormality.  Age-related atrophy and chronic small vessel ischemic changes are seen as well as stable bilateral basal ganglia lacunar infarcts.  Patient was given aspirin 324 mg, IV K 10 mEq x 3 and oral K 40 mEq x 1.  Per EDP, pacemaker was interrogated without evidence of significant arrhythmia or other abnormality.  The hospitalist service was consulted to admit for further evaluation and management.  Review of Systems: All systems reviewed and are negative except as documented in history of present illness above.   Past Medical History:  Diagnosis Date  . Arthritis   . AV block, Mobitz II    a. 09/2016 s/p MDT [id number] Azure XT DR MRI DC PPM  . Carotid artery disease ([name])    a. 09/2016 Carotid U/S: RICA 70-99%, LICA 50-69%;  b. 09/2016 CTA Neck: RICA 60, RCCA 30, LICA 70%, L Vert 60, L Basilar 70.  . Essential hypertension   . Hyperlipidemia   . LVH (left ventricular hypertrophy)    a. 09/2016 Echo: EF 60-65%, no rwma, Gr1 DD, Ca2+ MV annulus; b. 11/2017 Echo: EF 70-75%, no rwma, Gr1 DD, mild AS vs dynamic LVOT obs. Nl RV fxn. Hyperdynamic LVEF w/ sev LVH.    Past Surgical History:  Procedure Laterality Date  . ABDOMINAL HYSTERECTOMY    . PACEMAKER IMPLANT N/A 10/08/2016   Procedure: Pacemaker Implant;  Surgeon: Marinus Mawaylor, Gregg W, MD;  [facility];  Service: Cardiovascular;  Laterality: N/A;    Social History:  reports that  she has quit smoking. Her smoking use included cigarettes. She has a 30.00 pack-year smoking history. She has never used smokeless tobacco. She reports that she does not drink alcohol and does not use drugs.  No Known Allergies  Family History  Problem Relation Age of Onset  . Diabetes Mother   . CAD Father      Prior to Admission medications   Medication  Sig Start Date End Date Taking? Authorizing Provider  acetaminophen (TYLENOL) 325 MG tablet Take 650 mg by mouth every 6 (six) hours as needed.    [provider]  amLODipine (NORVASC) 2.5 MG tablet Take 2.5 mg by mouth daily. 10/06/19   [provider]  busPIRone (BUSPAR) 7.5 MG tablet Take 7.5 mg by mouth daily.  09/07/19   [provider]  furosemide (LASIX) 20 MG tablet Take 20 mg by mouth daily.  09/07/19   [provider]  lisinopril (ZESTRIL) 10 MG tablet Take 10 mg by mouth daily.  09/07/19   [provider]  rosuvastatin (CRESTOR) 40 MG tablet Take 1 tablet (40 mg total) by mouth daily. 10/19/19   Antonieta Iba, MD  Vitamin D, Ergocalciferol, (DRISDOL) 1.25 MG (50000 UNIT) CAPS capsule Take 50,000 Units by mouth once a week. 10/06/19   [provider]    Physical Exam: Vitals:   11/06/19 1534 11/06/19 1536  BP: 138/76   Pulse: 77   Resp: 16   Temp: 98.4 F (36.9 C)   TempSrc: Oral   SpO2: 98%   Weight:  70.3 kg  Height:  5' 5.5" (1.664 m)   Constitutional: Resting in bed with head slightly elevated, NAD, calm, comfortable Eyes: PERRL, lids and conjunctivae normal ENMT: Mucous membranes are moist. Posterior pharynx clear of any exudate or lesions.poor dentition.  Neck: normal, supple, no masses. Respiratory: clear to auscultation bilaterally, no wheezing, no crackles. Normal respiratory effort. No accessory muscle use.  Cardiovascular: Regular rate and rhythm, soft systolic murmur present. No extremity edema. 2+ pedal pulses. Abdomen: no tenderness, no masses palpated. No hepatosplenomegaly. Bowel sounds positive.  Musculoskeletal: no clubbing / cyanosis. No joint deformity upper and lower extremities. Good ROM, no contractures. Normal muscle tone.  Skin: no rashes, lesions, ulcers. No induration Neurologic: CN 2-12 grossly intact. Sensation intact, Strength 5/5 in all 4.  Psychiatric: Normal judgment and insight. Alert and  oriented x 3. Normal mood.   Labs on Admission: I have personally reviewed following labs and imaging studies  CBC: Recent Labs  Lab 11/06/19 1538  WBC 8.9  HGB 13.1  HCT 38.4  MCV 86.3  PLT 314   Basic Metabolic Panel: Recent Labs  Lab 11/06/19 1536 11/06/19 1538  NA  --  142  K  --  2.6*  CL  --  99  CO2  --  28  GLUCOSE  --  113*  BUN  --  26*  CREATININE  --  1.65*  CALCIUM  --  9.1  MG 2.3  --    GFR: Estimated Creatinine Clearance: 28.8 mL/min (A) (by C-G formula based on SCr of 1.65 mg/dL (H)). Liver Function Tests: No results for input(s): AST, ALT, ALKPHOS, BILITOT, PROT, ALBUMIN in the last 168 hours. No results for input(s): LIPASE, AMYLASE in the last 168 hours. No results for input(s): AMMONIA in the last 168 hours. Coagulation Profile: No results for input(s): INR, PROTIME in the last 168 hours. Cardiac Enzymes: No results for input(s): CKTOTAL, CKMB, CKMBINDEX, TROPONINI in the last 168 hours.  BNP (last 3 results) No results for input(s): PROBNP in the last 8760 hours. HbA1C: No results for input(s): HGBA1C in the last 72 hours. CBG: No results for input(s): GLUCAP in the last 168 hours. Lipid Profile: No results for input(s): CHOL, HDL, LDLCALC, TRIG, CHOLHDL, LDLDIRECT in the last 72 hours. Thyroid Function Tests: No results for input(s): TSH, T4TOTAL, FREET4, T3FREE, THYROIDAB in the last 72 hours. Anemia Panel: No results for input(s): VITAMINB12, FOLATE, FERRITIN, TIBC, IRON, RETICCTPCT in the last 72 hours. Urine analysis:    Component Value Date/Time   COLORURINE YELLOW (A) 11/06/2019 1538   APPEARANCEUR CLOUDY (A) 11/06/2019 1538   LABSPEC 1.012 11/06/2019 1538   PHURINE 6.0 11/06/2019 1538   GLUCOSEU NEGATIVE 11/06/2019 1538   HGBUR NEGATIVE 11/06/2019 1538   BILIRUBINUR NEGATIVE 11/06/2019 1538   KETONESUR NEGATIVE 11/06/2019 1538   PROTEINUR 100 (A) 11/06/2019 1538   NITRITE NEGATIVE 11/06/2019 1538   LEUKOCYTESUR NEGATIVE  11/06/2019 1538    Radiological Exams on Admission: CT Head Wo Contrast  Result Date: 11/06/2019 CLINICAL DATA:  Syncopal episodes EXAM: CT HEAD WITHOUT CONTRAST TECHNIQUE: Contiguous axial images were obtained from the base of the skull through the vertex without intravenous contrast. COMPARISON:  April 24, 2018 FINDINGS: Brain: No evidence of acute territorial infarction, hemorrhage, hydrocephalus,extra-axial collection or mass lesion/mass effect. There is dilatation the ventricles and sulci consistent with age-related atrophy. Low-attenuation changes in the deep white matter consistent with small vessel ischemia. Bilateral basal ganglia lacunar infarcts are seen. Vascular: No hyperdense vessel or unexpected calcification. Skull: The skull is intact. No fracture or focal lesion identified. Sinuses/Orbits: Ethmoid air cell mucosal thickening is seen. The orbits and globes intact. Other: None IMPRESSION: No acute intracranial abnormality. Findings consistent with age related atrophy and chronic small vessel ischemia Stable bilateral basal ganglia lacunar infarcts. Electronically Signed   By: Jonna Clark M.D.   On: 11/06/2019 18:28    EKG: Independently reviewed.  Sinus rhythm, RBBB.  Not significantly changed when compared to prior  Assessment/Plan Principal Problem:   Syncope Active Problems:   Second degree AV block, Mobitz type II   Hypertension   Dyslipidemia   RBBB   S/P placement of cardiac pacemaker   Elevated troponin   Hypokalemia   AKI (acute kidney injury) ([name])  Yocelin PRIMA RAYNER is a 74 y.o. 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  Syncope: Differential includes cardiogenic given history  of second-degree AV block and hypokalemia, vasovagal, or related to known carotid artery stenosis.  She also reports orthostatic type symptoms recently though her reported syncopal episode occurred with positional change.  Recent work-up with nuclear stress test 10/26/2019 was a low risk study.  Echocardiogram 08/29/2019 showed EF 54%, LVH, LV diastolic dysfunction, tricuspid valve regurgitation, otherwise normal study -Monitor on telemetry -Correct hypokalemia -Obtain orthostatic vitals -Continue IV fluid hydration overnight -CT head negative for acute changes  Hypokalemia: Continue repletion and recheck labs in a.m.  Acute kidney injury: Suspect due to hypoperfusion during syncopal episode.  Continue IV fluid hydration overnight.  Hold home lisinopril and Lasix.  Elevated troponin: Mild elevation, suspect due to demand ischemia.  She had a recent low risk nuclear stress test.  We will continue to trend cardiac enzymes.  Second-degree AV block, Mobitz  type II s/p PPM RBBB: EKG on admission shows sinus rhythm with RBBB.  Per EP, PPM interrogation was without significant arrhythmia or abnormality.  Carotid artery disease: Follows with vascular surgery, Dr. Gilda Crease.  Currently plan for right-sided endarterectomy on 11/16/2019. -Continue aspirin, rosuvastatin  Hypertension: Holding home lisinopril, Lasix, amlodipine due to possible orthostasis and AKI as above.  Hyperlipidemia: Continue rosuvastatin.   DVT prophylaxis: Subcutaneous heparin Code Status: Full code, confirmed with patient Family Communication: Discussed with patient, she has discussed with family Disposition Plan: From group home, likely discharge to prior facility Consults called: None Admission status:  Status is: Observation  The patient remains OBS appropriate and will d/c before 2 midnights.  Dispo: The patient is from: Group home              Anticipated d/c is to: Group home              Anticipated d/c date  is: 1 day              Patient currently is not medically stable to d/c.   Darreld Mclean MD [facility]  If 7PM-7AM, please contact night-coverage [email or web address]  11/06/2019, 7:42 PM

## 2019-11-07 DIAGNOSIS — R55 Syncope and collapse: Secondary | ICD-10-CM

## 2019-11-07 DIAGNOSIS — R778 Other specified abnormalities of plasma proteins: Secondary | ICD-10-CM

## 2019-11-07 LAB — BASIC METABOLIC PANEL
Anion gap: 8 (ref 5–15)
Anion gap: 11 (ref 5–15)
BUN: 26 mg/dL — ABNORMAL HIGH (ref 8–23)
BUN: 20 mg/dL (ref 8–23)
CO2: 27 mmol/L (ref 22–32)
CO2: 25 mmol/L (ref 22–32)
Calcium: 8.3 mg/dL — ABNORMAL LOW (ref 8.9–10.3)
Calcium: 8.4 mg/dL — ABNORMAL LOW (ref 8.9–10.3)
Chloride: 105 mmol/L (ref 98–111)
Chloride: 106 mmol/L (ref 98–111)
Creatinine, Ser: 1.28 mg/dL — ABNORMAL HIGH (ref 0.44–1.00)
Creatinine, Ser: 1.29 mg/dL — ABNORMAL HIGH (ref 0.44–1.00)
GFR calc Af Amer: 47 mL/min — ABNORMAL LOW (ref >60)
GFR calc Af Amer: 47 mL/min — ABNORMAL LOW (ref >60)
GFR calc non Af Amer: 41 mL/min — ABNORMAL LOW (ref >60)
GFR calc non Af Amer: 40 mL/min — ABNORMAL LOW (ref >60)
Glucose, Bld: 135 mg/dL — ABNORMAL HIGH (ref 70–99)
Glucose, Bld: 114 mg/dL — ABNORMAL HIGH (ref 70–99)
Potassium: 4 mmol/L (ref 3.5–5.1)
Potassium: 3.7 mmol/L (ref 3.5–5.1)
Sodium: 140 mmol/L (ref 135–145)
Sodium: 142 mmol/L (ref 135–145)

## 2019-11-07 LAB — CBC
HCT: 33.5 % — ABNORMAL LOW (ref 36.0–46.0)
Hemoglobin: 11.3 g/dL — ABNORMAL LOW (ref 12.0–15.0)
MCH: 29.1 pg (ref 26.0–34.0)
MCHC: 33.7 g/dL (ref 30.0–36.0)
MCV: 86.3 fL (ref 80.0–100.0)
Platelets: 291 10*3/uL (ref 150–400)
RBC: 3.88 MIL/uL (ref 3.87–5.11)
RDW: 11.9 % (ref 11.5–15.5)
WBC: 7.6 10*3/uL (ref 4.0–10.5)
nRBC: 0 % (ref 0.0–0.2)

## 2019-11-07 LAB — TROPONIN I (HIGH SENSITIVITY)
Troponin I (High Sensitivity): 168 ng/L (ref <18)
Troponin I (High Sensitivity): 173 ng/L (ref <18)

## 2019-11-07 LAB — MAGNESIUM: Magnesium: 2.1 mg/dL (ref 1.7–2.4)

## 2019-11-07 MED ORDER — AMLODIPINE BESYLATE 10 MG PO TABS
10.0000 mg | ORAL_TABLET | Freq: Every day | ORAL | Status: DC
  Administered 2019-11-07: 10 mg via ORAL
  Filled 2019-11-07 (×2): qty 1

## 2019-11-07 MED ORDER — SODIUM CHLORIDE 0.9 % IV SOLN: INTRAVENOUS | Status: DC

## 2019-11-07 MED ORDER — VITAMIN D (ERGOCALCIFEROL) 1.25 MG (50000 UNIT) PO CAPS
50000.0000 [IU] | ORAL_CAPSULE | ORAL | Status: DC
  Administered 2019-11-08: 50000 [IU] via ORAL
  Filled 2019-11-07: qty 1

## 2019-11-07 MED ORDER — POTASSIUM CHLORIDE CRYS ER 20 MEQ PO TBCR
40.0000 meq | EXTENDED_RELEASE_TABLET | Freq: Once | ORAL | Status: AC
  Administered 2019-11-07: 40 meq via ORAL
  Filled 2019-11-07: qty 2

## 2019-11-07 MED ORDER — HYDRALAZINE HCL 25 MG PO TABS: 25.0000 mg | ORAL_TABLET | Freq: Four times a day (QID) | ORAL | Status: DC | PRN

## 2019-11-07 NOTE — Evaluation (Signed)
Physical Therapy Evaluation Patient Details Name: Shannon Chen MRN: [id number] DOB: 11/20/1945 Today's Date: 11/07/2019   History of Present Illness  Shannon Chen is a 74 y.o. 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  Clinical Impression  Pt was pleasant and motivated to participate during the session. Pt was able to move transfer from chair with Min Guard assist with SpO2 remaining in the 90s. Pt ambulated 125' with minimal drifting and inconsistent use of quad cane. Pt did demonstrate decreased exercise tolerance and pt's gait became more unsteady as the pt fatigued. Pt will benefit from HHPT services upon discharge to safely address deficits listed in patient problem list for decreased caregiver assistance and eventual return to PLOF.      Follow Up Recommendations Supervision for mobility/OOB;Other (comment);Home health PT (continue with HHPT that pt is currently recieving)    Equipment Recommendations  Standard walker    Recommendations for Other Services       Precautions / Restrictions Precautions Precautions: Fall Restrictions Weight Bearing Restrictions: No      Mobility  Bed Mobility Overal bed mobility: Needs Assistance Bed Mobility: Rolling;Supine to Sit Rolling: Supervision   Supine to sit: Supervision     General bed mobility comments: min cuing for technique  Transfers Overall transfer level: Needs assistance Equipment used: None Transfers: Sit to/from [employer] Transfers Sit to Stand: Min guard Stand pivot transfers: Min guard       General transfer comment: pt uses some mommentum to come to standing  Ambulation/Gait Ambulation/Gait assistance: Min guard Gait Distance (Feet): 125 Feet Assistive device: Quad cane Gait Pattern/deviations: Drifts right/left Gait  velocity: Decreased   General Gait Details: pt demonstrates decreased balance for walking and reports feeling "wobbly" after walking about 75'  Stairs            Wheelchair Mobility    Modified Rankin (Stroke Patients Only)       Balance Overall balance assessment: Needs assistance   Sitting balance-Leahy Scale: Good Sitting balance - Comments: no LOB with dynamic sitting balance tasks   Standing balance support: During functional activity;No upper extremity supported Standing balance-Leahy Scale: Fair Standing balance comment: supervision - min guard without use of AD                             Pertinent Vitals/Pain Pain Assessment: No/denies pain    Home Living Family/patient expects to be discharged to:: Group home Living Arrangements: Group Home               Additional Comments: pt uses no AD at home but quad cane for mobility ambulation    Prior Function Level of Independence: Independent with assistive device(s)         Comments: IND with all ADLs except for getting in and out of the shower, which she has an assistant to help her. 1 fall getting out of the shower     Hand Dominance   Dominant Hand: Right    Extremity/Trunk Assessment   Upper Extremity Assessment Upper Extremity Assessment: Overall WFL for tasks assessed    Lower Extremity Assessment Lower Extremity Assessment: Overall WFL for tasks assessed    Cervical / Trunk Assessment Cervical / Trunk Assessment: Normal  Communication   Communication: No difficulties  Cognition Arousal/Alertness: Awake/alert Behavior During Therapy:  WFL for tasks assessed/performed Overall Cognitive Status: Within Functional Limits for tasks assessed                                        General Comments      Exercises Total Joint Exercises Ankle Circles/Pumps: AROM;Both;10 reps Quad Sets: AROM;Both;10 reps Long Arc Quad: Both;AROM;10 reps Marching in  Standing: PROM;Both;5 reps Other Exercises Other Exercises: pt educated about conservation of energy   Assessment/Plan    PT Assessment Patient needs continued PT services  PT Problem List Decreased strength;Decreased mobility;Decreased safety awareness;Decreased activity tolerance;Decreased balance;Decreased knowledge of use of DME       PT Treatment Interventions DME instruction;Therapeutic exercise;Gait training;Balance training;Neuromuscular re-education;Functional mobility training;Therapeutic activities;Patient/family education    PT Goals (Current goals can be found in the Care Plan section)  Acute Rehab PT Goals Patient Stated Goal: to go home and get stronger PT Goal Formulation: With patient Time For Goal Achievement: 11/20/19    Frequency Min 2X/week   Barriers to discharge        Co-evaluation               AM-PAC PT "6 Clicks" Mobility  Outcome Measure Help needed turning from your back to your side while in a flat bed without using bedrails?: None Help needed moving from lying on your back to sitting on the side of a flat bed without using bedrails?: None Help needed moving to and from a bed to a chair (including a wheelchair)?: A Little Help needed standing up from a chair using your arms (e.g., wheelchair or bedside chair)?: A Little Help needed to walk in hospital room?: A Little Help needed climbing 3-5 steps with a railing? : A Lot 6 Click Score: 19    End of Session Equipment Utilized During Treatment: Gait belt Activity Tolerance: Patient tolerated treatment well Patient left: in chair;with call bell/phone within reach Nurse Communication: Mobility status PT Visit Diagnosis: Unsteadiness on feet (R26.81);Difficulty in walking, not elsewhere classified (R26.2);Muscle weakness (generalized) (M62.81)    Time: 1610-9604 PT Time Calculation (min) (ACUTE ONLY): 21 min   Charges:              Nicolette Bang, SPT 11/07/19. 1:53 PM

## 2019-11-07 NOTE — Progress Notes (Signed)
Noticed a knot of patients right temporal and Left mid back. MD notified.

## 2019-11-07 NOTE — Progress Notes (Signed)
Clydie Braun from the facility called to check on the pt. Pt stated it was okay to give Clydie Braun information.Clydie Braun updated.

## 2019-11-07 NOTE — Progress Notes (Signed)
BP 162/63.  Pt refuses to take PRN hydralazine at this.  Will recheck at later time.  Pt educated but still refuses at this time.

## 2019-11-07 NOTE — Progress Notes (Addendum)
PROGRESS NOTE    Shannon Chen  DZH:[id number] DOB: August 14, 1945 DOA: 11/06/2019 PCP: Franciso Bend, NP  Outpatient Specialists: [facility]    Brief Narrative:  Shannon Chen is a 74 y.o. 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  Patient does not recall the events but states that she last remembers sitting down in church and feeling hot and weak.  The next thing she knows she is surrounded by people telling her that she lost consciousness and slumped over without falling to the ground.  She was seemingly unconscious for less than a minute before returning back to her baseline.  She does state that she has had decreased appetite and not keeping up with her diet recently.  She noticed becoming lightheaded easily when she stands up at home.  She says one of her blood pressure medications was decreased in dosage due to dizziness.  She has had recent cardiac and vascular work-up for upcoming planned right-sided endarterectomy currently scheduled for 11/16/2019. Work-up included nuclear stress test 10/26/2019 which was a low risk study.  Echocardiogram 08/29/2019 showed EF 54%, LVH, LV diastolic dysfunction, tricuspid valve regurgitation, otherwise normal study.  ED Course:   CT head without contrast was negative for acute intracranial abnormality.  Age-related atrophy and chronic small vessel ischemic changes are seen as well as stable bilateral basal ganglia lacunar infarcts.  Patient was given aspirin 324 mg, IV K 10 mEq x 3 and oral K 40 mEq x 1.  Per EDP, pacemaker was interrogated without evidence of significant arrhythmia or other abnormality.  The hospitalist service was consulted  to admit for further evaluation and management.   Assessment & Plan:   Principal Problem:   Syncope Active Problems:   Second degree AV block, Mobitz type II   Hypertension   Dyslipidemia   RBBB   S/P placement of cardiac pacemaker   Elevated troponin   Hypokalemia   AKI (acute kidney injury) ([name])   Syncope: Differential includes cardiogenic given history of second-degree AV block and hypokalemia, vasovagal, dehydration. Recent work-up with nuclear stress test 10/26/2019 was a low risk study.  Echocardiogram 08/29/2019 showed EF 54%, LVH, LV diastolic dysfunction, tricuspid valve regurgitation, otherwise normal study. Pacemaker reportedly interrogated in ED w/o significant findings. CT head negative. -Monitor on telemetry -Correct hypokalemia as below -Obtain orthostatic vitals -dedrease fluids to NS @75  - cardiology consulted  Hypokalemia -  Severe at 2.6. On lasix at home, reports no recent changes. No recent vomiting or diarrhea. Received 110 meq along with 40 of K w/ IV fluids and K is normal this morning, Mg also normal - re-check PM labs  Acute kidney injury: Initial cr 1.65 from baseline of normal, down to 1.28 with IV hydration. Suspect due to hypoperfusion during syncopal episode.   - reduce fluids to 75 - PO ad lib  Elevated troponin: Mild elevation, stable on trend x4, no ischemic changes on ekg, suspect due to demand ischemia.  She had a recent low risk nuclear stress test.    Second-degree AV block, Mobitz type II s/p PPM RBBB: EKG on admission shows sinus rhythm with RBBB.  Per EP, PPM interrogation was without significant arrhythmia or abnormality. - f/u cardiology consult  Carotid artery disease: Follows with  vascular surgery, Dr. Gilda Crease.  Currently plan for right-sided endarterectomy on 11/16/2019. Syncope is not a typical symptoms of carotid artery stenosis -Continue aspirin, rosuvastatin  Hypertension: Holding home lisinopril, Lasix, amlodipine  due to possible orthostasis and AKI as above. BP this morning wnl  Hyperlipidemia: Continue rosuvastatin.   DVT prophylaxis: heparin Code Status: Full Family Communication: nephew updated telephonically Disposition Plan: return to assisted living (PT/OT consulted)   Consultants:   Cardiology  Procedures:  none  Antimicrobials:  none    Subjective: Says feels "crummy" because didn't eat much yesterday. No specific complaints. No ha, no chest pain or palpitations, no recurrence of syncope or presyneop. No vomiting or diarrhea, has an appetite.  Objective: Vitals:   11/07/19 0100 11/07/19 0448 11/07/19 0600 11/07/19 0733  BP:  (!) 158/65  136/68  Pulse:  72  72  Resp: 19 18 20 17   Temp:  97.7 F (36.5 C)  99.2 F (37.3 C)  TempSrc:  Oral  Oral  SpO2:  98%  97%  Weight:  70.7 kg    Height:        Intake/Output Summary (Last 24 hours) at 11/07/2019 0901 Last data filed at 11/07/2019 0700 Gross per 24 hour  Intake 615.11 ml  Output --  Net 615.11 ml   Filed Weights   11/06/19 1536 11/07/19 0448  Weight: 70.3 kg 70.7 kg    Examination:  General exam: Appears calm and comfortable  Respiratory system: Clear to auscultation. Respiratory effort normal. Cardiovascular system: S1 & S2 heard, RRR. Holosystolic murmur. Gastrointestinal system: Abdomen is nondistended, soft and nontender. No organomegaly or masses felt. Normal bowel sounds heard. Central nervous system: Alert and oriented. No focal neurological deficits. Extremities: Symmetric 5 x 5 power. Skin: No rashes, lesions or ulcers Psychiatry: Judgement and insight appear normal. Mood & affect appropriate.     Data Reviewed: I have personally reviewed following labs and imaging studies  CBC: Recent Labs  Lab 11/06/19 1538 11/07/19 0044  WBC 8.9 7.6  HGB 13.1 11.3*  HCT 38.4 33.5*  MCV 86.3 86.3  PLT 314 291   Basic Metabolic Panel: Recent Labs  Lab 11/06/19 1536 11/06/19 1538 11/07/19 0044   NA  --  142 140  K  --  2.6* 4.0  CL  --  99 105  CO2  --  28 27  GLUCOSE  --  113* 135*  BUN  --  26* 26*  CREATININE  --  1.65* 1.28*  CALCIUM  --  9.1 8.3*  MG 2.3  --  2.1   GFR: Estimated Creatinine Clearance: 37.3 mL/min (A) (by C-G formula based on SCr of 1.28 mg/dL (H)). Liver Function Tests: No results for input(s): AST, ALT, ALKPHOS, BILITOT, PROT, ALBUMIN in the last 168 hours. No results for input(s): LIPASE, AMYLASE in the last 168 hours. No results for input(s): AMMONIA in the last 168 hours. Coagulation Profile: No results for input(s): INR, PROTIME in the last 168 hours. Cardiac Enzymes: No results for input(s): CKTOTAL, CKMB, CKMBINDEX, TROPONINI in the last 168 hours. BNP (last 3 results) No results for input(s): PROBNP in the last 8760 hours. HbA1C: No results for input(s): HGBA1C in the last 72 hours. CBG: No results for input(s): GLUCAP in the last 168 hours. Lipid Profile: No results for input(s): CHOL, HDL, LDLCALC, TRIG, CHOLHDL, LDLDIRECT in the last 72 hours. Thyroid Function Tests: No results for input(s): TSH, T4TOTAL, FREET4, T3FREE, THYROIDAB in the last 72 hours. Anemia Panel: No results for input(s):  VITAMINB12, FOLATE, FERRITIN, TIBC, IRON, RETICCTPCT in the last 72 hours. Urine analysis:    Component Value Date/Time   COLORURINE YELLOW (A) 11/06/2019 1538   APPEARANCEUR CLOUDY (A) 11/06/2019 1538   LABSPEC 1.012 11/06/2019 1538   PHURINE 6.0 11/06/2019 1538   GLUCOSEU NEGATIVE 11/06/2019 1538   HGBUR NEGATIVE 11/06/2019 1538   BILIRUBINUR NEGATIVE 11/06/2019 1538   KETONESUR NEGATIVE 11/06/2019 1538   PROTEINUR 100 (A) 11/06/2019 1538   NITRITE NEGATIVE 11/06/2019 1538   LEUKOCYTESUR NEGATIVE 11/06/2019 1538   Sepsis Labs: @LABRCNTIP (procalcitonin:4,lacticidven:4)  ) Recent Results (from the past 240 hour(s))  SARS Coronavirus 2 by RT PCR (hospital order, performed in [facility] lab) Nasopharyngeal Nasopharyngeal Swab      Status: None   Collection Time: 11/06/19  7:45 PM   Specimen: Nasopharyngeal Swab  Result Value Ref Range Status   SARS Coronavirus 2 NEGATIVE NEGATIVE Final    Comment: (NOTE) SARS-CoV-2 target nucleic acids are NOT DETECTED.  The SARS-CoV-2 RNA is generally detectable in upper and lower respiratory specimens during the acute phase of infection. The lowest concentration of SARS-CoV-2 viral copies this assay can detect is 250 copies / mL. A negative result does not preclude SARS-CoV-2 infection and should not be used as the sole basis for treatment or other patient management decisions.  A negative result may occur with improper specimen collection / handling, submission of specimen other than nasopharyngeal swab, presence of viral mutation(s) within the areas targeted by this assay, and inadequate number of viral copies (<250 copies / mL). A negative result must be combined with clinical observations, patient history, and epidemiological information.  Fact Sheet for Patients:   BoilerBrush.com.cyhttps://www.fda.gov/media/136312/download  Fact Sheet for Healthcare Providers: https://pope.com/https://www.fda.gov/media/136313/download  This test is not yet approved or  cleared by the [place] FDA and has been authorized for detection and/or diagnosis of SARS-CoV-2 by FDA under an Emergency Use Authorization (EUA).  This EUA will remain in effect (meaning this test can be used) for the duration of the COVID-19 declaration under Section 564(b)(1) of the Act, 21 U.S.C. section 360bbb-3(b)(1), unless the authorization is terminated or revoked sooner.  Performed at [facility], [address] [place], [place] [id number]          Radiology Studies: CT Head Wo Contrast  Result Date: 11/06/2019 CLINICAL DATA:  Syncopal episodes EXAM: CT HEAD WITHOUT CONTRAST TECHNIQUE: Contiguous axial images were obtained from the base of the skull through the vertex without intravenous contrast. COMPARISON:   April 24, 2018 FINDINGS: Brain: No evidence of acute territorial infarction, hemorrhage, hydrocephalus,extra-axial collection or mass lesion/mass effect. There is dilatation the ventricles and sulci consistent with age-related atrophy. Low-attenuation changes in the deep white matter consistent with small vessel ischemia. Bilateral basal ganglia lacunar infarcts are seen. Vascular: No hyperdense vessel or unexpected calcification. Skull: The skull is intact. No fracture or focal lesion identified. Sinuses/Orbits: Ethmoid air cell mucosal thickening is seen. The orbits and globes intact. Other: None IMPRESSION: No acute intracranial abnormality. Findings consistent with age related atrophy and chronic small vessel ischemia Stable bilateral basal ganglia lacunar infarcts. Electronically Signed   By: Jonna ClarkBindu  Avutu M.D.   On: 11/06/2019 18:28        Scheduled Meds: . aspirin EC  81 mg Oral Daily  . heparin  5,000 Units Subcutaneous Q8H  . rosuvastatin  40 mg Oral q1800  . sodium chloride flush  3 mL Intravenous Q12H   Continuous Infusions: . sodium chloride  LOS: 0 days    Time spent: 40 min    Silvano Bilis, MD [facility]   If 7PM-7AM, please contact night-coverage [email or web address] Password [email or web address] 11/07/2019, 9:01 AM

## 2019-11-07 NOTE — Progress Notes (Signed)
   11/07/19 1709  Vitals  Temp 98.2 F (36.8 C)  Temp Source Oral  BP (!) 201/94  MAP (mmHg) 123  BP Location Left Arm  BP Method Automatic  Patient Position (if appropriate) Sitting  Pulse Rate 89  Pulse Rate Source Monitor  Resp 20   Pt BP is elevated. Checked x3. MD notified.
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
## 2019-11-07 NOTE — Evaluation (Signed)
Occupational Therapy Evaluation Patient Details Name: Shannon Chen MRN: [id number] DOB: 18-Nov-1945 Today's Date: 11/07/2019    History of Present Illness Shannon Chen is a 74 y.o. 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   Clinical Impression   Patient presenting with decreased I in self care, balance, functional transfer/mobility, endurance, strength, and safety awareness.  Patient reports being mod I PTA with use of quad cane. She lives in a group home and is able to vacuum and maintain her room independently. Group home assists with meals, medications, and assists her into/out of shower but she performs bathing and dressing on her own.Patient currently functioning at min guard overall. Patient will benefit from acute OT to increase overall independence in the areas of ADLs, functional mobility, and safety awareness in order to safely discharge to group home.    Follow Up Recommendations  No OT follow up;Supervision/Assistance - 24 hour    Equipment Recommendations  None recommended by OT       Precautions / Restrictions Precautions Precautions: Fall Restrictions Weight Bearing Restrictions: No      Mobility Bed Mobility Overal bed mobility: Needs Assistance Bed Mobility: Rolling;Supine to Sit Rolling: Supervision   Supine to sit: Supervision     General bed mobility comments: min cuing for technique  Transfers Overall transfer level: Needs assistance Equipment used: None Transfers: Sit to/from [employer] Sit to Stand: Min guard Stand pivot transfers: Min guard       General transfer comment: pt uses some mommentum to come to standing    Balance Overall balance assessment: Needs assistance   Sitting balance-Leahy Scale: Good Sitting balance - Comments: no LOB with  dynamic sitting balance tasks   Standing balance support: During functional activity;No upper extremity supported Standing balance-Leahy Scale: Fair Standing balance comment: supervision - min guard without use of AD          ADL either performed or assessed with clinical judgement   ADL Overall ADL's : Needs assistance/impaired     Grooming: Wash/dry hands;Wash/dry face;Oral care;Applying deodorant;Standing;Min guard           Upper Body Dressing : Set up;Standing;Supervision/safety   Lower Body Dressing: Min guard;Sit to/from stand      Functional mobility during ADLs: Min guard General ADL Comments: Pt reports feeling "close" to her baseline. Pt ambulating without use of AD this session and reports normally utilizing quad cane at home. Pt needing min cuing and min guard overall for safety with self care tasks in standing.                  Pertinent Vitals/Pain Pain Assessment: No/denies pain     Hand Dominance Right   Extremity/Trunk Assessment Upper Extremity Assessment Upper Extremity Assessment: Overall WFL for tasks assessed   Lower Extremity Assessment Lower Extremity Assessment: Overall WFL for tasks assessed   Cervical / Trunk Assessment Cervical / Trunk Assessment: Normal   Communication Communication Communication: No difficulties   Cognition Arousal/Alertness: Awake/alert Behavior During Therapy: WFL for tasks assessed/performed Overall Cognitive Status: Within Functional Limits for tasks assessed           Exercises Total Joint Exercises Ankle Circles/Pumps: AROM;Both;10 reps Quad Sets: AROM;Both;10 reps Long Arc Quad: Both;AROM;10 reps Marching in Standing: PROM;Both;5 reps Other Exercises Other Exercises: pt educated about conservation of energy  Home Living Family/patient expects to be discharged to:: Group home Living Arrangements: Group Home     Additional Comments: pt uses no AD at home but quad cane for mobility  ambulation      Prior Functioning/Environment Level of Independence: Independent with assistive device(s)        Comments: IND with all ADLs except for getting in and out of the shower, which she has an assistant to help her. 1 fall getting out of the shower        OT Problem List: Decreased strength;Decreased coordination;Decreased activity tolerance;Decreased safety awareness;Decreased knowledge of use of DME or AE;Impaired balance (sitting and/or standing);Decreased cognition      OT Treatment/Interventions: Self-care/ADL training;Therapeutic exercise;Therapeutic activities;Energy conservation;DME and/or AE instruction;Patient/family education;Balance training    OT Goals(Current goals can be found in the care plan section) Acute Rehab OT Goals Patient Stated Goal: to go home and get stronger OT Goal Formulation: With patient Time For Goal Achievement: 11/21/19 Potential to Achieve Goals: Good ADL Goals Pt Will Perform Grooming: with modified independence;standing Pt Will Transfer to Toilet: with modified independence;ambulating Pt Will Perform Toileting - Clothing Manipulation and hygiene: with modified independence;sit to/from stand  OT Frequency: Min 1X/week   Barriers to D/C:    none at this time          AM-PAC OT "6 Clicks" Daily Activity     Outcome Measure Help from another person eating meals?: None Help from another person taking care of personal grooming?: A Little Help from another person toileting, which includes using toliet, bedpan, or urinal?: A Little Help from another person bathing (including washing, rinsing, drying)?: A Little Help from another person to put on and taking off regular upper body clothing?: None Help from another person to put on and taking off regular lower body clothing?: A Little 6 Click Score: 20   End of Session Nurse Communication: Mobility status  Activity Tolerance: Patient tolerated treatment well Patient left: in  chair;with call bell/phone within reach;with chair alarm set  OT Visit Diagnosis: Muscle weakness (generalized) (M62.81)                Time: 9233-0076 OT Time Calculation (min): 26 min Charges:  OT General Charges $OT Visit: 1 Visit OT Evaluation $OT Eval Low Complexity: 1 Low OT Treatments $Self Care/Home Management : 8-22 mins  Jackquline Denmark, MS, OTR/L , CBIS ascom [phone]  11/07/19, 12:43 PM

## 2019-11-08 ENCOUNTER — Observation Stay (HOSPITAL_BASED_OUTPATIENT_CLINIC_OR_DEPARTMENT_OTHER)
Admit: 2019-11-08 | Discharge: 2019-11-08 | Disposition: A | Discharge status: Home / Self Care | Payer: Medicare Other | Attending: Physician Assistant | Admitting: Physician Assistant

## 2019-11-08 ENCOUNTER — Other Ambulatory Visit (INDEPENDENT_AMBULATORY_CARE_PROVIDER_SITE_OTHER): Payer: Self-pay | Admitting: Nurse Practitioner

## 2019-11-08 DIAGNOSIS — N179 Acute kidney failure, unspecified: Secondary | ICD-10-CM

## 2019-11-08 DIAGNOSIS — R55 Syncope and collapse: Secondary | ICD-10-CM | POA: Diagnosis not present

## 2019-11-08 LAB — TSH: TSH: 0.294 u[IU]/mL — ABNORMAL LOW (ref 0.350–4.500)

## 2019-11-08 LAB — BASIC METABOLIC PANEL
Anion gap: 7 (ref 5–15)
BUN: 17 mg/dL (ref 8–23)
CO2: 25 mmol/L (ref 22–32)
Calcium: 8.5 mg/dL — ABNORMAL LOW (ref 8.9–10.3)
Chloride: 109 mmol/L (ref 98–111)
Creatinine, Ser: 1.03 mg/dL — ABNORMAL HIGH (ref 0.44–1.00)
GFR calc Af Amer: >60 mL/min (ref >60)
GFR calc non Af Amer: 53 mL/min — ABNORMAL LOW (ref >60)
Glucose, Bld: 112 mg/dL — ABNORMAL HIGH (ref 70–99)
Potassium: 3.5 mmol/L (ref 3.5–5.1)
Sodium: 141 mmol/L (ref 135–145)

## 2019-11-08 LAB — ECHOCARDIOGRAM COMPLETE
AR max vel: 1.93 cm2
AV Area VTI: 2.05 cm2
AV Area mean vel: 1.92 cm2
AV Mean grad: 6 mmHg
AV Peak grad: 10.3 mmHg
Ao pk vel: 1.6 m/s
Area-P 1/2: 2.97 cm2
Height: 65.5 in
S' Lateral: 2.76 cm
Weight: 2600 oz

## 2019-11-08 LAB — MAGNESIUM: Magnesium: 2 mg/dL (ref 1.7–2.4)

## 2019-11-08 MED ORDER — POTASSIUM CHLORIDE CRYS ER 20 MEQ PO TBCR
40.0000 meq | EXTENDED_RELEASE_TABLET | Freq: Once | ORAL | Status: AC
  Administered 2019-11-08: 40 meq via ORAL
  Filled 2019-11-08: qty 2

## 2019-11-08 MED ORDER — ASPIRIN 81 MG PO TBEC
81.0000 mg | DELAYED_RELEASE_TABLET | Freq: Every day | ORAL | 11 refills | Status: DC
Start: 2019-11-09 — End: 2022-02-27

## 2019-11-08 MED ORDER — METOPROLOL TARTRATE 25 MG PO TABS
25.0000 mg | ORAL_TABLET | Freq: Two times a day (BID) | ORAL | Status: DC
  Administered 2019-11-08: 25 mg via ORAL
  Filled 2019-11-08: qty 1

## 2019-11-08 MED ORDER — AMLODIPINE BESYLATE 5 MG PO TABS
5.0000 mg | ORAL_TABLET | Freq: Every day | ORAL | Status: DC
  Administered 2019-11-08: 5 mg via ORAL
  Filled 2019-11-08: qty 1

## 2019-11-08 MED ORDER — AMLODIPINE BESYLATE 2.5 MG PO TABS
5.0000 mg | ORAL_TABLET | Freq: Every day | ORAL | 0 refills | Status: DC
Start: 2019-11-08 — End: 2019-11-28

## 2019-11-08 MED ORDER — METOPROLOL TARTRATE 25 MG PO TABS
25.0000 mg | ORAL_TABLET | Freq: Two times a day (BID) | ORAL | 0 refills | Status: DC
Start: 2019-11-08 — End: 2019-11-28

## 2019-11-08 MED ORDER — ENOXAPARIN SODIUM 40 MG/0.4ML ~~LOC~~ SOLN: 40.0000 mg | SUBCUTANEOUS | Status: DC

## 2019-11-08 NOTE — TOC Transition Note (Signed)
Transition of Care [facility]) - CM/SW Discharge Note   Patient Details  Name: Shannon Chen MRN: [id number] Date of Birth: [birth year]  [facility] CM/SW Contact:  Shawn Route, RN Phone Number: 11/08/2019, 3:44 PM   Clinical Narrative:     Patient discharging back to [facility], Spoke with Administrator Cheri and they will pick patient up at discharge.  DME walker ordered and will be delivered to room before discharge. .nn           Patient Goals and CMS Choice        Discharge Placement                       Discharge Plan and Services                                     Social Determinants of Health (SDOH) Interventions     Readmission Risk Interventions No flowsheet data found.

## 2019-11-08 NOTE — Progress Notes (Signed)
*  PRELIMINARY RESULTS* Echocardiogram 2D Echocardiogram has been performed.  Cristela Blue 11/08/2019, 1:21 PM

## 2019-11-08 NOTE — Progress Notes (Signed)
Progress Note  Patient Name: Shannon Chen Date of Encounter: 11/08/2019  Primary Cardiologist: [name]  Subjective   No chest pain, dyspnea, palpitations, dizziness, presyncope, or further syncope.  With hydration, her renal function continues to improve.  Potassium is improved from 2.6 upon presentation to a current value of 3.5.  Magnesium at goal.  Inpatient Medications    Scheduled Meds: . amLODipine  5 mg Oral Daily  . aspirin EC  81 mg Oral Daily  . heparin  5,000 Units Subcutaneous Q8H  . metoprolol tartrate  25 mg Oral BID  . rosuvastatin  40 mg Oral q1800  . sodium chloride flush  3 mL Intravenous Q12H  . Vitamin D (Ergocalciferol)  50,000 Units Oral Q7 days   Continuous Infusions: . sodium chloride 75 mL/hr at 11/07/19 1047   PRN Meds: acetaminophen **OR** acetaminophen, hydrALAZINE   Vital Signs    Vitals:   11/08/19 0400 11/08/19 0426 11/08/19 0600 11/08/19 0742  BP:  (!) 111/59  129/71  Pulse:  85  88  Resp: 16  15 19   Temp:  98.6 F (37 C)  98.4 F (36.9 C)  TempSrc:  Oral  Oral  SpO2:  98%  98%  Weight:  73.7 kg    Height:        Intake/Output Summary (Last 24 hours) at 11/08/2019 1023 Last data filed at 11/08/2019 0600 Gross per 24 hour  Intake 1330.76 ml  Output 1301 ml  Net 29.76 ml   Filed Weights   11/06/19 1536 11/07/19 0448 11/08/19 0426  Weight: 70.3 kg 70.7 kg 73.7 kg    Telemetry    SR, 12 beat run of SVT at 6:31 AM, intermittent pacing - Personally Reviewed  ECG    No new tracings- Personally Reviewed  Physical Exam   GEN: No acute distress.   Neck: No JVD. Cardiac: RRR, I/VI systolic murmur RUSB, no rubs, or gallops.  Respiratory: Clear to auscultation bilaterally.  GI: Soft, nontender, non-distended.   MS: No edema; No deformity. Neuro:  Alert and oriented x 3; Nonfocal.  Psych: Normal affect.  Labs    Chemistry Recent Labs  Lab 11/07/19 0044 11/07/19 1355 11/08/19 0550  NA 140 142 141  K 4.0 3.7 3.5   CL 105 106 109  CO2 27 25 25   GLUCOSE 135* 114* 112*  BUN 26* 20 17  CREATININE 1.28* 1.29* 1.03*  CALCIUM 8.3* 8.4* 8.5*  GFRNONAA 41* 40* 53*  GFRAA 47* 47* >60  ANIONGAP 8 11 7      Hematology Recent Labs  Lab 11/06/19 1538 11/07/19 0044  WBC 8.9 7.6  RBC 4.45 3.88  HGB 13.1 11.3*  HCT 38.4 33.5*  MCV 86.3 86.3  MCH 29.4 29.1  MCHC 34.1 33.7  RDW 11.8 11.9  PLT 314 291    Cardiac EnzymesNo results for input(s): TROPONINI in the last 168 hours. No results for input(s): TROPIPOC in the last 168 hours.   BNPNo results for input(s): BNP, PROBNP in the last 168 hours.   DDimer No results for input(s): DDIMER in the last 168 hours.   Radiology    CT Head Wo Contrast  Result Date: 11/06/2019 IMPRESSION: No acute intracranial abnormality. Findings consistent with age related atrophy and chronic small vessel ischemia Stable bilateral basal ganglia lacunar infarcts. Electronically Signed   By: 11/08/19 M.D.   On: 11/06/2019 18:28    Cardiac Studies   2D echo this admission: Pending __________  11/08/2019 MPI 10/26/2019:  ST segment depression was noted during stress in the V3, V4, V5, V6, I, II, III and aVF leads.  T wave inversion was noted during stress. T wave inversion persisted.  The study is normal.  This is a low risk study.  The left ventricular ejection fraction is normal (61%).  There is no evidence for ischemia __________  2D echo 08/2019: EF 54%, normal wall motion, diastolic dysfunction, tricuspid regurgitation, normal RV systolic function and ventricular cavity size, normal PASP, normal size and structure aortic root __________  2D echo 04/2018: 1. The left ventricle has normal systolic function with an ejection  fraction of 60-65%. The cavity size was decreased. There is severely  increased left ventricular wall thickness. Near cavity obliteration in  systole. Left ventricular diastolic Doppler  parameters are consistent with impaired  relaxation.  2. The right ventricle has normal systolic function. The cavity was  normal. There is no increase in right ventricular wall thickness. Unable  to estimate RVSP  3. Left atrial size was mildly dilated. __________  2D echo 11/2017: - Left ventricle: The cavity size was normal. Wall thickness was  increased in a pattern of severe LVH. The basal inferior wall is  relatively spared. Systolic function was hyperdynamic. The  estimated ejection fraction was in the range of 70% to 75%. Wall  motion was normal; there were no regional wall motion  abnormalities. Doppler parameters are consistent with abnormal  left ventricular relaxation (grade 1 diastolic dysfunction).  - Aortic valve: Transvalvular velocity was minimally increased.  There was very mild stenosis versus dynamic LVOT obstruction.  - Mitral valve: Calcified annulus.  - Right ventricle: The cavity size was normal. Systolic function  was normal.   Impressions:   - Hyperdynamic LVEF with severe LVH. Hypertrophic cardiomyopathy  and dynamic LVOT obstruction (HOCM) cannot be excluded.   Patient Profile     74 y.o. 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 intake and orthostasis, severe carotid artery stenosis bilaterally, prior CVA, long history of tobacco use quitting in 2020, HTN, HLD, and medication nonadherence who is being seen today for the evaluation of syncope.  Assessment & Plan    1. Syncope with possible HOCM:  -Episode of syncope presumably in the setting of dehydration with poor p.o. intake and recently added Lasix with underlying possible HOCM -Less likely arrhythmogenic in the setting of HOCM given pacemaker interrogation in the ED showed no significant arrhythmia with a VT threshold set at 150 bpm -Update echo -Case has been discussed with EP as well  who feels like the above is the most likely scenario for her syncopal episode -No emergent indication for device upgrade to ICD -No plans for LifeVest at this time -Add Lopressor 25 mg twice daily -Would continue to avoid long-term diuretics and medications that reduce afterload -Recent ischemic evaluation low risk  2.  Bilateral carotid artery stenosis: -Scheduled for surgery on 9/29 -She has been nonadherent with statin therapy -Continue aspirin and Crestor  3.  Second-degree AV block type II: -Status post Medtronic PPM with normal device function and no significant arrhythmia or prolonged pauses noted on interrogation in the ED this admission -Despite PPM implantation syncopal episodes have continued to occur, possibly in the setting of HOCM with intermittent volume depletion in the context of poor p.o. intake and recent diuretic addition -Follow-up with EP as outpatient  4.  AKI: -Likely prerenal with dehydration  -Improving with IV fluids  5. Elevated troponin: -No symptoms of chest pain -Recent low risk Lexiscan MPI and outpatient echo done by outside office with a reported EF of 54% -Update echo as above -Minimally elevated and flat trending not consistent with ACS -No indication for heparin drip -Likely supply demand ischemia in the setting of dehydration with AKI and hypotension with associated syncope -ASA -Statin -No plans for inpatient ischemic evaluation at this time  6.  Paroxysmal SVT: -Noted every 12 beat run of SVT earlier this morning -Asymptomatic -Add Lopressor as above -Replete potassium to goal 4.0 -Add TSH  7.  HLD: -Previously nonadherent to statin therapy -Continue Crestor  8.  HTN: -BP has been mildly elevated at times -Add Lopressor 25 mg twice daily as outlined above -Decrease amlodipine to 5 mg daily  For questions or updates, please contact [facility] Please consult [email or web address] for contact info under Cardiology/STEMI.     Signed, Eula Listen, PA-C [facility] Pager: [phone] 11/08/2019, 10:23 AM

## 2019-11-08 NOTE — Discharge Summary (Signed)
Shannon Chen WUJ:[id number] DOB: 04-21-1945 DOA: 11/06/2019  PCP: Franciso Bend, NP  Admit date: 11/06/2019 Discharge date: 11/08/2019  Time spent: 35 minutes  Recommendations for Outpatient Follow-up:  1. 1 week follow-up with primary cardiologist [facility] heart care), cardiology to schedule   Discharge Diagnoses:  Principal Problem:   Syncope Active Problems:   Second degree AV block, Mobitz type II   Hypertension   Dyslipidemia   RBBB   S/P placement of cardiac pacemaker   Elevated troponin   Hypokalemia   AKI (acute kidney injury) ([name])   Discharge Condition: fair  Diet recommendation: heart healthy  Filed Weights   11/06/19 1536 11/07/19 0448 11/08/19 0426  Weight: 70.3 kg 70.7 kg 73.7 kg    History of present illness:  Shannon M Bethelis a 74 y.o.femalewith medical history significant forMobitz type II second-degree AV block s/p PPM, carotid artery disease (per cardiology documentation in Care Everywhere 80-90% right ICA and 60-79% left ICA stenosis by carotid ultrasound 08/29/2019), RBBB, hypertension, and hyperlipidemia who presents to the ED for evaluation after syncopal episode.  Patient does not recall the events but states that she last remembers sitting down in church and feeling hot and weak. The next thing she knows she is surrounded by people telling her that she lost consciousness and slumped over without falling to the ground. She was seemingly unconscious for less than a minute before returning back to her baseline.  She does state that she has had decreased appetite and not keeping up with her diet recently. She noticed becoming lightheaded easily when she stands up at home. She says one of her blood pressure medications was decreased in dosage due to dizziness.  She has had recent cardiac and vascular work-up for upcoming planned right-sided endarterectomy currently scheduled for 11/16/2019.Work-up included nuclear stress test 10/26/2019 which  was a low risk study. Echocardiogram 08/29/2019 showed EF 54%, LVH, LV diastolic dysfunction, tricuspid valve regurgitation, otherwise normal study.  ED Course:  CT head without contrast was negative for acute intracranial abnormality. Age-related atrophy and chronic small vessel ischemic changes are seen as well as stable bilateral basal ganglia lacunar infarcts.  Patient was given aspirin 324 mg, IV K 10 mEq x 3 and oral K 40 mEq x 1.Per EDP, pacemaker was interrogated without evidence of significant arrhythmia or other abnormality. The hospitalist service was consulted to admit for further evaluation and management.  Hospital Course:  Syncope: Thought to be secondary to volume depletion 2/2 heat/dehydration and diuretic.  Recent work-up with nuclear stress test 10/26/2019 was a low risk study. echocardiogram performed showed hyperdynamic LV and diastolic dysfunction. CT head negative. - cardiology advises hold lasix and lisinopril, start metoprolol, increase amlodipine to 5, and f/u with cardiology in 1 week  Hypokalemia -  Severe at 2.6, likely 2/2 diuretic use. Resolved - hold at discharge  Acute kidney injury: Resolved with fluids and holding home diuretic  Elevated troponin: Mild elevation, stable on trend x4, no ischemic changes on ekg, suspect due to demand ischemia. She had a recent low risk nuclear stress test.   Second-degree AV block, Mobitz type II s/p PPM RBBB: EKG on admission shows sinus rhythm with RBBB. Per EP, PPM interrogation was without significant arrhythmia or abnormality.  Carotid artery disease: Follows with vascular surgery, Dr. Gilda Crease. Currently plan for right-sided endarterectomy on 11/16/2019.   Hypertension: See bp med changes above  Hyperlipidemia: Continue rosuvastatin.  Procedures:  TTE  Consultations:  cardiology  Discharge Exam: Vitals:   11/08/19  1114 11/08/19 1522  BP: 135/68 138/71  Pulse: 63 68  Resp: 18 18   Temp: 98.3 F (36.8 C) 98.4 F (36.9 C)  SpO2: 99% 99%    General exam: Appears calm and comfortable  Respiratory system: Clear to auscultation. Respiratory effort normal. Cardiovascular system: S1 & S2 heard, RRR. Holosystolic murmur. Gastrointestinal system: Abdomen is nondistended, soft and nontender. No organomegaly or masses felt. Normal bowel sounds heard. Central nervous system: Alert and oriented. No focal neurological deficits. Extremities: Symmetric 5 x 5 power. Skin: No rashes, lesions or ulcers Psychiatry: Judgement and insight appear normal. Mood & affect appropriate.    Discharge Instructions   Discharge Instructions    Call MD for:  difficulty breathing, headache or visual disturbances   Complete by: As directed    Call MD for:  extreme fatigue   Complete by: As directed    Call MD for:  severe uncontrolled pain   Complete by: As directed    Call MD for:  temperature >100.4   Complete by: As directed    Diet - low sodium heart healthy   Complete by: As directed    Increase activity slowly   Complete by: As directed      Allergies as of 11/08/2019   No Known Allergies     Medication List    STOP taking these medications   furosemide 20 MG tablet Commonly known as: LASIX   lisinopril 10 MG tablet Commonly known as: ZESTRIL   Vitamin D (Ergocalciferol) 1.25 MG (50000 UNIT) Caps capsule Commonly known as: DRISDOL     TAKE these medications   acetaminophen 325 MG tablet Commonly known as: TYLENOL Take 650 mg by mouth every 6 (six) hours as needed.   amLODipine 2.5 MG tablet Commonly known as: NORVASC Take 2 tablets (5 mg total) by mouth daily. What changed: how much to take   aspirin 81 MG EC tablet Take 1 tablet (81 mg total) by mouth daily. Swallow whole. Start taking on: November 09, 2019   busPIRone 7.5 MG tablet Commonly known as: BUSPAR Take 7.5 mg by mouth daily.   metoprolol tartrate 25 MG tablet Commonly known as:  LOPRESSOR Take 1 tablet (25 mg total) by mouth 2 (two) times daily.   rosuvastatin 40 MG tablet Commonly known as: CRESTOR Take 1 tablet (40 mg total) by mouth daily.            Durable Medical Equipment  (From admission, onward)         Start     Ordered   11/08/19 1531  For home use only DME Walker rolling  Once       Question Answer Comment  Walker: With 5 Inch Wheels   Patient needs a walker to treat with the following condition Weakness      11/08/19 1531         No Known Allergies    The results of significant diagnostics from this hospitalization (including imaging, microbiology, ancillary and laboratory) are listed below for reference.    Significant Diagnostic Studies: CT Head Wo Contrast  Result Date: 11/06/2019 CLINICAL DATA:  Syncopal episodes EXAM: CT HEAD WITHOUT CONTRAST TECHNIQUE: Contiguous axial images were obtained from the base of the skull through the vertex without intravenous contrast. COMPARISON:  April 24, 2018 FINDINGS: Brain: No evidence of acute territorial infarction, hemorrhage, hydrocephalus,extra-axial collection or mass lesion/mass effect. There is dilatation the ventricles and sulci consistent with age-related atrophy. Low-attenuation changes in the deep white matter consistent  with small vessel ischemia. Bilateral basal ganglia lacunar infarcts are seen. Vascular: No hyperdense vessel or unexpected calcification. Skull: The skull is intact. No fracture or focal lesion identified. Sinuses/Orbits: Ethmoid air cell mucosal thickening is seen. The orbits and globes intact. Other: None IMPRESSION: No acute intracranial abnormality. Findings consistent with age related atrophy and chronic small vessel ischemia Stable bilateral basal ganglia lacunar infarcts. Electronically Signed   By: Jonna Clark M.D.   On: 11/06/2019 18:28   CT ANGIO NECK W OR WO CONTRAST  Result Date: 10/12/2019 CLINICAL DATA:  Carotid stenosis.  Worsening by previous  ultrasound. EXAM: CT ANGIOGRAPHY NECK TECHNIQUE: Multidetector CT imaging of the neck was performed using the standard protocol during bolus administration of intravenous contrast. Multiplanar CT image reconstructions and MIPs were obtained to evaluate the vascular anatomy. Carotid stenosis measurements (when applicable) are obtained utilizing NASCET criteria, using the distal internal carotid diameter as the denominator. CONTRAST:  34mL OMNIPAQUE IOHEXOL 350 MG/ML SOLN COMPARISON:  Ultrasound 04/25/2018.  CT angiography 10/08/2016. FINDINGS: Aortic arch: Pronounced aortic atherosclerosis with calcified and soft plaque and wall irregularity. No sign of dissection. Right carotid system: Innominate artery is widely patent. Common carotid artery is tortuous and shows soft and calcified plaque. 30% stenosis 4 cm proximal to the bifurcation as seen previously. Complex soft and calcified plaque at the carotid bifurcation and ICA bulb. Minimal diameter at the distal bulb is 1.5 mm. Compared to an expected distal cervical ICA diameter of 5 mm, this indicates a 70% stenosis. Actual diameter of the distal internal carotid artery measures only 4 mm, therefore the stenosis is only 60% using NASCET criteria. The morphology of the stenosis is self appears quite similar to the study of 2018. Left carotid system: There is beam hardening artifact at the left common carotid artery origin. The common carotid artery shows soft and calcified plaque but no stenosis proximal to the bifurcation. Complex soft and calcified plaque at the carotid bifurcation and ICA bulb. Minimal diameter is 1.8-2 mm. Compared to a more distal cervical ICA diameter of 5 mm, this indicates a 60-70% stenosis. Both internal carotid arteries are patent through the skull base and siphon regions. Calcified plaque in both carotid siphon regions but without stenosis greater than 30% suspected. Vertebral arteries: The think proximal right vertebral artery is occluded  and reconstituted in the cervical region by cervical collaterals. This is a severely disease vessel with multiple serial stenoses. The vessel does show flow through the foramen magnum. The right V4 segment is severely diseased in is occluded or nearly occluded. Flow is present within right PICA. Left subclavian artery shows stenosis at its origin estimated at 50%. Beam hardening artifact prevents accurate evaluation of the next 2-3 cm of the vessel. The left vertebral artery origin is poorly demonstrated. The vessel does show flow beyond that with multiple serial stenoses through the cervical region. The vessel is patent through the foramen magnum. No vertebral artery V4 stenosis. PICA shows flow. The left vertebral artery give supply to the basilar. 70% stenosis of proximal basilar artery. Skeleton: Ordinary cervical spondylosis and facet osteoarthritis Other neck: No soft tissue mass or lymphadenopathy. Upper chest: No focal or active process. Slightly prominent interstitial markings. IMPRESSION: 1. Advanced aortic atherosclerosis with calcified and soft plaque and wall irregularity. No sign of dissection. 2. 30% stenosis of the right common carotid artery 4 cm proximal to the bifurcation. Complex soft and calcified plaque at the carotid bifurcation and ICA bulb. Minimal diameter at the distal bulb  is 1.5 mm, therefore the stenosis is only 60% using NASCET criteria given a distal ICA diameter of 4 mm. The morphology of the stenosis is self appears quite similar to the study of 2018. 3. Complex soft and calcified plaque at the left carotid bifurcation and ICA bulb regions. Minimal diameter at the distal bulb is 1.8-2 mm, therefore the stenosis is 60-70%, considering distal ICA diameter of 5 mm. The morphology of the stenosis it self appears quite similar to the study of 2018. 4. Left vertebral artery origin, proximal left common carotid artery and proximal left subclavian artery obscured by beam hardening artifact.  Left vertebral artery is severely disease with multiple serial stenoses but does remain patent to the basilar. 70% stenosis of the basilar artery as shown previously. 5. The proximal right vertebral artery is occluded and reconstituted in the cervical region by cervical collaterals. This is a severely diseased vessel. There is supply to right PICA but no contribution to the basilar. Aortic Atherosclerosis (ICD10-I70.0). Electronically Signed   By: Paulina Fusi M.D.   On: 10/12/2019 16:19   NM Myocar Multi W/Spect W/Wall Motion / EF  Result Date: 10/26/2019  ST segment depression was noted during stress in the V3, V4, V5, V6, I, II, III and aVF leads.  T wave inversion was noted during stress. T wave inversion persisted.  The study is normal.  This is a low risk study.  The left ventricular ejection fraction is normal (61%).  There is no evidence for ischemia    ECHOCARDIOGRAM COMPLETE  Result Date: 11/08/2019    ECHOCARDIOGRAM REPORT   Patient Name:   MARK BENECKE Date of Exam: 11/08/2019 Medical Rec #:  [id number]       Height:       65.5 in Accession #:    [id number]      Weight:       162.5 lb Date of Birth:  1945-12-26       BSA:          1.821 m Patient Age:    74 years        BP:           135/68 mmHg Patient Gender: F               HR:           63 bpm. Exam Location:  [facility] Procedure: 2D Echo, Cardiac Doppler and Color Doppler Indications:     Syncope 780.2  History:         Patient has prior history of Echocardiogram examinations, most                  recent 04/25/2018. Risk Factors:Dyslipidemia and Hypertension. AV                  block mobitz II, LVH.  Sonographer:     Cristela Blue RDCS (AE) Referring Phys:  [id number] Sondra Barges Diagnosing Phys: Yvonne Kendall MD  Sonographer Comments: Suboptimal parasternal window. IMPRESSIONS  1. Left ventricular ejection fraction, by estimation, is 70 to 75%. The left ventricle has hyperdynamic function. The left ventricle has no regional wall motion  abnormalities. There is severe left ventricular hypertrophy. Left ventricular diastolic parameters are consistent with Grade I diastolic dysfunction (impaired relaxation). Elevated left atrial pressure.  2. Right ventricular systolic function is mildly reduced. The right ventricular size is normal. There is normal pulmonary artery systolic pressure.  3. The mitral valve is normal in structure. Trivial  mitral valve regurgitation. No evidence of mitral stenosis.  4. The aortic valve has an indeterminant number of cusps. There is mild calcification of the aortic valve. There is moderate thickening of the aortic valve. Aortic valve regurgitation is not visualized. Mild to moderate aortic valve sclerosis/calcification is present, without any evidence of aortic stenosis.  5. The inferior vena cava is normal in size with greater than 50% respiratory variability, suggesting right atrial pressure of 3 mmHg. FINDINGS  Left Ventricle: Left ventricular ejection fraction, by estimation, is 70 to 75%. The left ventricle has hyperdynamic function. The left ventricle has no regional wall motion abnormalities. The left ventricular internal cavity size was small. There is severe left ventricular hypertrophy. Left ventricular diastolic parameters are consistent with Grade I diastolic dysfunction (impaired relaxation). Elevated left atrial pressure. Right Ventricle: The right ventricular size is normal. No increase in right ventricular wall thickness. Right ventricular systolic function is mildly reduced. There is normal pulmonary artery systolic pressure. The tricuspid regurgitant velocity is 2.43 m/s, and with an assumed right atrial pressure of 3 mmHg, the estimated right ventricular systolic pressure is 26.6 mmHg. Left Atrium: Left atrial size was normal in size. Right Atrium: Right atrial size was normal in size. Pericardium: There is no evidence of pericardial effusion. Mitral Valve: The mitral valve is normal in structure. Mild  mitral annular calcification. Trivial mitral valve regurgitation. No evidence of mitral valve stenosis. Tricuspid Valve: The tricuspid valve is not well visualized. Tricuspid valve regurgitation is trivial. Aortic Valve: The aortic valve has an indeterminant number of cusps. There is mild calcification of the aortic valve. There is moderate thickening of the aortic valve. Aortic valve regurgitation is not visualized. Mild to moderate aortic valve sclerosis/calcification is present, without any evidence of aortic stenosis. Aortic valve mean gradient measures 6.0 mmHg. Aortic valve peak gradient measures 10.3 mmHg. Aortic valve area, by VTI measures 2.05 cm. Pulmonic Valve: The pulmonic valve was not well visualized. Pulmonic valve regurgitation is not visualized. No evidence of pulmonic stenosis. Aorta: The aortic root is normal in size and structure. Pulmonary Artery: The pulmonary artery is not well seen. Venous: The inferior vena cava is normal in size with greater than 50% respiratory variability, suggesting right atrial pressure of 3 mmHg. IAS/Shunts: The interatrial septum was not well visualized. Additional Comments: A pacer wire is visualized in the right ventricle.  LEFT VENTRICLE PLAX 2D LVIDd:         3.17 cm  Diastology LVIDs:         2.76 cm  LV e' medial:    3.92 cm/s LV PW:         1.47 cm  LV E/e' medial:  15.5 LV IVS:        1.80 cm  LV e' lateral:   3.81 cm/s LVOT diam:     2.00 cm  LV E/e' lateral: 16.0 LV SV:         63 LV SV Index:   34 LVOT Area:     3.14 cm  RIGHT VENTRICLE RV Basal diam:  3.71 cm RV S prime:     8.92 cm/s TAPSE (M-mode): 1.7 cm LEFT ATRIUM             Index       RIGHT ATRIUM           Index LA diam:        2.80 cm 1.54 cm/m  RA Area:     10.70 cm LA Vol (A2C):  28.7 ml 15.76 ml/m RA Volume:   24.80 ml  13.62 ml/m LA Vol (A4C):   45.4 ml 24.93 ml/m LA Biplane Vol: 37.6 ml 20.64 ml/m  AORTIC VALVE                    PULMONIC VALVE AV Area (Vmax):    1.93 cm     PV  Vmax:        0.54 m/s AV Area (Vmean):   1.92 cm     PV Peak grad:   1.1 mmHg AV Area (VTI):     2.05 cm     RVOT Peak grad: 3 mmHg AV Vmax:           160.33 cm/s AV Vmean:          111.667 cm/s AV VTI:            0.305 m AV Peak Grad:      10.3 mmHg AV Mean Grad:      6.0 mmHg LVOT Vmax:         98.60 cm/s LVOT Vmean:        68.400 cm/s LVOT VTI:          0.199 m LVOT/AV VTI ratio: 0.65  AORTA Ao Root diam: 2.40 cm MITRAL VALVE               TRICUSPID VALVE MV Area (PHT): 2.97 cm    TR Peak grad:   23.6 mmHg MV Decel Time: 255 msec    TR Vmax:        243.00 cm/s MV E velocity: 60.80 cm/s MV A velocity: 86.50 cm/s  SHUNTS MV E/A ratio:  0.70        Systemic VTI:  0.20 m                            Systemic Diam: 2.00 cm Yvonne Kendall MD Electronically signed by Yvonne Kendall MD Signature Date/Time: 11/08/2019/3:04:54 PM    Final     Microbiology: Recent Results (from the past 240 hour(s))  SARS Coronavirus 2 by RT PCR (hospital order, performed in [facility] hospital lab) Nasopharyngeal Nasopharyngeal Swab     Status: None   Collection Time: 11/06/19  7:45 PM   Specimen: Nasopharyngeal Swab  Result Value Ref Range Status   SARS Coronavirus 2 NEGATIVE NEGATIVE Final    Comment: (NOTE) SARS-CoV-2 target nucleic acids are NOT DETECTED.  The SARS-CoV-2 RNA is generally detectable in upper and lower respiratory specimens during the acute phase of infection. The lowest concentration of SARS-CoV-2 viral copies this assay can detect is 250 copies / mL. A negative result does not preclude SARS-CoV-2 infection and should not be used as the sole basis for treatment or other patient management decisions.  A negative result may occur with improper specimen collection / handling, submission of specimen other than nasopharyngeal swab, presence of viral mutation(s) within the areas targeted by this assay, and inadequate number of viral copies (<250 copies / mL). A negative result must be combined with  clinical observations, patient history, and epidemiological information.  Fact Sheet for Patients:   BoilerBrush.com.cy  Fact Sheet for Healthcare Providers: https://pope.com/  This test is not yet approved or  cleared by the [place] FDA and has been authorized for detection and/or diagnosis of SARS-CoV-2 by FDA under an Emergency Use Authorization (EUA).  This EUA will remain in effect (meaning this test can be  used) for the duration of the COVID-19 declaration under Section 564(b)(1) of the Act, 21 U.S.C. section 360bbb-3(b)(1), unless the authorization is terminated or revoked sooner.  Performed at [facility], [address] [place], [place] [address]      Labs: Basic Metabolic Panel: Recent Labs  Lab 11/06/19 1536 11/06/19 1538 11/07/19 0044 11/07/19 1355 11/08/19 0550  NA  --  142 140 142 141  K  --  2.6* 4.0 3.7 3.5  CL  --  99 105 106 109  CO2  --  28 27 25 25   GLUCOSE  --  113* 135* 114* 112*  BUN  --  26* 26* 20 17  CREATININE  --  1.65* 1.28* 1.29* 1.03*  CALCIUM  --  9.1 8.3* 8.4* 8.5*  MG 2.3  --  2.1  --  2.0   Liver Function Tests: No results for input(s): AST, ALT, ALKPHOS, BILITOT, PROT, ALBUMIN in the last 168 hours. No results for input(s): LIPASE, AMYLASE in the last 168 hours. No results for input(s): AMMONIA in the last 168 hours. CBC: Recent Labs  Lab 11/06/19 1538 11/07/19 0044  WBC 8.9 7.6  HGB 13.1 11.3*  HCT 38.4 33.5*  MCV 86.3 86.3  PLT 314 291   Cardiac Enzymes: No results for input(s): CKTOTAL, CKMB, CKMBINDEX, TROPONINI in the last 168 hours. BNP: BNP (last 3 results) No results for input(s): BNP in the last 8760 hours.  ProBNP (last 3 results) No results for input(s): PROBNP in the last 8760 hours.  CBG: No results for input(s): GLUCAP in the last 168 hours.     Signed:  11/09/19 MD.  [facility] 11/08/2019, 3:42 PM

## 2019-11-09 ENCOUNTER — Telehealth: Payer: Self-pay | Admitting: *Deleted

## 2019-11-09 ENCOUNTER — Telehealth: Payer: Self-pay | Admitting: Cardiovascular Disease

## 2019-11-09 ENCOUNTER — Encounter
Admission: RE | Admit: 2019-11-09 | Discharge: 2019-11-09 | Disposition: A | Discharge status: Home / Self Care | Payer: Medicare Other | Source: Ambulatory Visit | Attending: Vascular Surgery | Admitting: Vascular Surgery

## 2019-11-09 ENCOUNTER — Other Ambulatory Visit: Payer: Self-pay

## 2019-11-09 DIAGNOSIS — Z01812 Encounter for preprocedural laboratory examination: Secondary | ICD-10-CM | POA: Diagnosis not present

## 2019-11-09 HISTORY — DX: Presence of cardiac pacemaker: Z95.0

## 2019-11-09 LAB — TYPE AND SCREEN
ABO/RH(D): O POS
Antibody Screen: NEGATIVE

## 2019-11-09 NOTE — Telephone Encounter (Signed)
-----   Message from Sondra Barges, PA-C sent at 11/08/2019  3:21 PM EDT ----- Can you please get her set up with TCM follow up in 1-2 weeks? Thanks!

## 2019-11-09 NOTE — Patient Instructions (Signed)
Your procedure is scheduled on: Wed. 9/29 Report to Day Surgery. To find out your arrival time please call [phone] between 1PM - 3PM on Tues 9/28.  Remember: Instructions that are not followed completely may result in serious medical risk,  up to and including death, or upon the discretion of your surgeon and anesthesiologist your  surgery may need to be rescheduled.     _X__ 1. Do not eat food after midnight the night before your procedure.                 No chewing gum or hard candies. You may drink clear liquids up to 2 hours                 before you are scheduled to arrive for your surgery- DO not drink clear                 liquids within 2 hours of the start of your surgery.                 Clear Liquids include:  water, apple juice without pulp, clear Gatorade, G2 or                  Gatorade Zero (avoid Red/Purple/Blue), Black Coffee or Tea (Do not add                 anything to coffee or tea). _____2.   Complete the "Ensure Clear Pre-surgery Clear Carbohydrate Drink" provided to you, 2 hours before arrival. **If you       are diabetic you will be provided with an alternative drink, Gatorade Zero or G2.  __X__2.  On the morning of surgery brush your teeth with toothpaste and water, you                may rinse your mouth with mouthwash if you wish.  Do not swallow any toothpaste of mouthwash.     _X__ 3.  No Alcohol for 24 hours before or after surgery.   ___ 4.  Do Not Smoke or use e-cigarettes For 24 Hours Prior to Your Surgery.                 Do not use any chewable tobacco products for at least 6 hours prior to                 Surgery.  ___  5.  Do not use any recreational drugs (marijuana, cocaine, heroin, ecstasy, MDMA or other)                For at least one week prior to your surgery.  Combination of these drugs with anesthesia                May have life threatening results.  ____  6.  Bring all medications with you on the day of  surgery if instructed.   __x_  7.  Notify your doctor if there is any change in your medical condition      (cold, fever, infections).     Do not wear jewelry, make-up, hairpins, clips or nail polish. Do not wear lotions, powders, or perfumes.  Do not shave 48 hours prior to surgery.  Do not bring valuables to the hospital.    [facility] is not responsible for any belongings or valuables.  Contacts, dentures or bridgework may not be worn into surgery. Leave your suitcase in the car. After surgery it may  be brought to your room. For patients admitted to the hospital, discharge time is determined by your treatment team.   Patients discharged the day of surgery will not be allowed to drive home.   Make arrangements for someone to be with you for the first 24 hours of your Same Day Discharge.    Please read over the following fact sheets that you were given:    __x__ Take these medicines the morning of surgery with A SIP OF WATER:    1. acetaminophen (TYLENOL) 325 MG tablet if needed  2. amLODipine (NORVASC) 2.5 MG tablet  3. busPIRone (BUSPAR) 7.5 MG tablet  4.metoprolol tartrate (LOPRESSOR) 25 MG tablet  5.  6.  ____ Fleet Enema (as directed)   __x__ Use CHG Soap (or wipes) as directed  ____ Use Benzoyl Peroxide Gel as instructed  ____ Use inhalers on the day of surgery  ____ Stop metformin 2 days prior to surgery    ____ Take 1/2 of usual insulin dose the night before surgery. No insulin the morning          of surgery.   _X___ Continue aspirin   DO NOT TAKE THE DAY OF SURGERY  ____ Stop Anti-inflammatories on    ____ Stop supplements until after surgery.    ____ Bring C-Pap to the hospital.    If you have any questions regarding your pre-procedure instructions,  Please call Pre-admit Testing at [phone]  [facility]

## 2019-11-09 NOTE — Telephone Encounter (Signed)
° °  [facility] Pre-operative Risk Assessment    HEARTCARE STAFF: - Please ensure there is not already an duplicate clearance open for this procedure. - Under Visit Info/Reason for Call, type in Other and utilize the format Clearance MM/DD/YY or Clearance TBD. Do not use dashes or single digits. - If request is for dental extraction, please clarify the # of teeth to be extracted.  Request for surgical clearance:  1. What type of surgery is being performed? RIGHT CAROTID ENDARTERCTOMY   2. When is this surgery scheduled? 11/16/19  3. What type of clearance is required (medical clearance vs. Pharmacy clearance to hold med vs. Both)? MEDICAL  4. Are there any medications that need to be held prior to surgery and how long? ASA   5. Practice name and name of physician performing surgery? DR. Belenda Cruise [facility]  6. What is the office phone number? [phone]   7.   What is the office fax number? [phone] ATTN: Honor Loh, MSN, APRN, FNP-C, CEN; [facility] PERI-OPERATIVE SERVICES NURSE PRACTITIONER  8.   Anesthesia type (None, local, MAC, general) ?GENERAL   Shannon Chen 11/09/2019, 2:53 PM  _________________________________________________________________   (provider comments below)

## 2019-11-09 NOTE — Telephone Encounter (Signed)
Attempted to schedule no ans no vm line busy

## 2019-11-09 NOTE — Telephone Encounter (Signed)
   Primary Cardiologist: Julien Nordmann, MD  Chart reviewed as part of pre-operative protocol coverage.   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 intake and orthostasis, severe carotid artery stenosis bilaterally, prior CVA, long history of tobacco use quitting in 2020, HTN, HLD, and medication nonadherence.  Stress test 10/26/19 low risk. The patient was seen by Dr. Okey Dupre yesterday during admission. Per note, no emergent indication for device upgrade to ICD.   Given past medical history and time since last visit, based on ACC/AHA guidelines, ALYCE INSCORE would be at acceptable risk for the planned procedure without further cardiovascular testing.   Okay to hold ASA 5-7 days prior.   [place], [place] 11/09/2019, 3:01 PM

## 2019-11-14 ENCOUNTER — Other Ambulatory Visit: Payer: Self-pay

## 2019-11-14 ENCOUNTER — Other Ambulatory Visit
Admission: RE | Admit: 2019-11-14 | Discharge: 2019-11-14 | Disposition: A | Discharge status: Home / Self Care | Payer: Medicare Other | Source: Ambulatory Visit | Attending: Vascular Surgery | Admitting: Vascular Surgery

## 2019-11-16 ENCOUNTER — Telehealth: Payer: Self-pay | Admitting: Cardiovascular Disease

## 2019-11-16 ENCOUNTER — Inpatient Hospital Stay: Payer: Medicare Other | Admitting: Anesthesiology

## 2019-11-16 ENCOUNTER — Other Ambulatory Visit: Payer: Self-pay

## 2019-11-16 ENCOUNTER — Inpatient Hospital Stay: Payer: Medicare Other

## 2019-11-16 ENCOUNTER — Encounter
Admission: RE | Disposition: A | Discharge status: Home / Self Care | Payer: Self-pay | Source: Home / Self Care | Attending: Vascular Surgery

## 2019-11-16 ENCOUNTER — Encounter: Payer: Self-pay | Admitting: Vascular Surgery

## 2019-11-16 ENCOUNTER — Inpatient Hospital Stay
Admission: RE | Admit: 2019-11-16 | Discharge: 2019-11-18 | DRG: 037 | Disposition: A | Discharge status: Home / Self Care | Payer: Medicare Other | Attending: Vascular Surgery | Admitting: Vascular Surgery

## 2019-11-16 DIAGNOSIS — Z79899 Other long term (current) drug therapy: Secondary | ICD-10-CM

## 2019-11-16 DIAGNOSIS — J9601 Acute respiratory failure with hypoxia: Secondary | ICD-10-CM | POA: Diagnosis present

## 2019-11-16 DIAGNOSIS — I2510 Atherosclerotic heart disease of native coronary artery without angina pectoris: Secondary | ICD-10-CM | POA: Diagnosis present

## 2019-11-16 DIAGNOSIS — Z8249 Family history of ischemic heart disease and other diseases of the circulatory system: Secondary | ICD-10-CM

## 2019-11-16 DIAGNOSIS — R0602 Shortness of breath: Secondary | ICD-10-CM

## 2019-11-16 DIAGNOSIS — J439 Emphysema, unspecified: Secondary | ICD-10-CM | POA: Diagnosis present

## 2019-11-16 DIAGNOSIS — I10 Essential (primary) hypertension: Secondary | ICD-10-CM | POA: Diagnosis present

## 2019-11-16 DIAGNOSIS — Z87891 Personal history of nicotine dependence: Secondary | ICD-10-CM

## 2019-11-16 DIAGNOSIS — E785 Hyperlipidemia, unspecified: Secondary | ICD-10-CM | POA: Diagnosis present

## 2019-11-16 DIAGNOSIS — Z833 Family history of diabetes mellitus: Secondary | ICD-10-CM

## 2019-11-16 DIAGNOSIS — Z950 Presence of cardiac pacemaker: Secondary | ICD-10-CM | POA: Diagnosis not present

## 2019-11-16 DIAGNOSIS — R578 Other shock: Secondary | ICD-10-CM | POA: Diagnosis present

## 2019-11-16 DIAGNOSIS — J811 Chronic pulmonary edema: Secondary | ICD-10-CM | POA: Diagnosis present

## 2019-11-16 DIAGNOSIS — I441 Atrioventricular block, second degree: Secondary | ICD-10-CM | POA: Diagnosis present

## 2019-11-16 DIAGNOSIS — I4510 Unspecified right bundle-branch block: Secondary | ICD-10-CM | POA: Diagnosis present

## 2019-11-16 DIAGNOSIS — M1990 Unspecified osteoarthritis, unspecified site: Secondary | ICD-10-CM | POA: Diagnosis present

## 2019-11-16 DIAGNOSIS — Z7982 Long term (current) use of aspirin: Secondary | ICD-10-CM | POA: Diagnosis not present

## 2019-11-16 DIAGNOSIS — Z20822 Contact with and (suspected) exposure to covid-19: Secondary | ICD-10-CM | POA: Diagnosis present

## 2019-11-16 DIAGNOSIS — I6523 Occlusion and stenosis of bilateral carotid arteries: Secondary | ICD-10-CM | POA: Diagnosis present

## 2019-11-16 DIAGNOSIS — I6521 Occlusion and stenosis of right carotid artery: Secondary | ICD-10-CM | POA: Diagnosis present

## 2019-11-16 HISTORY — PX: ENDARTERECTOMY: SHX5162

## 2019-11-16 LAB — TROPONIN I (HIGH SENSITIVITY): Troponin I (High Sensitivity): 67 ng/L — ABNORMAL HIGH (ref <18)

## 2019-11-16 LAB — RESPIRATORY PANEL BY RT PCR (FLU A&B, COVID)
Influenza A by PCR: NEGATIVE
Influenza B by PCR: NEGATIVE
SARS Coronavirus 2 by RT PCR: NEGATIVE

## 2019-11-16 LAB — ABO/RH: ABO/RH(D): O POS

## 2019-11-16 LAB — GLUCOSE, CAPILLARY
Glucose-Capillary: 94 mg/dL (ref 70–99)
Glucose-Capillary: 201 mg/dL — ABNORMAL HIGH (ref 70–99)

## 2019-11-16 SURGERY — ENDARTERECTOMY, CAROTID
Anesthesia: General | Laterality: Right

## 2019-11-16 MED ORDER — HEPARIN SODIUM (PORCINE) 1000 UNIT/ML IJ SOLN
INTRAMUSCULAR | Status: AC
  Filled 2019-11-16: qty 1

## 2019-11-16 MED ORDER — FAMOTIDINE 20 MG PO TABS: 20.0000 mg | ORAL_TABLET | Freq: Once | ORAL | Status: AC

## 2019-11-16 MED ORDER — AMLODIPINE BESYLATE 5 MG PO TABS
5.0000 mg | ORAL_TABLET | Freq: Every day | ORAL | Status: DC
  Administered 2019-11-17 – 2019-11-18 (×2): 5 mg via ORAL
  Filled 2019-11-16 (×2): qty 1

## 2019-11-16 MED ORDER — PROPOFOL 10 MG/ML IV BOLUS
INTRAVENOUS | Status: AC
  Filled 2019-11-16: qty 40

## 2019-11-16 MED ORDER — SUCCINYLCHOLINE CHLORIDE 200 MG/10ML IV SOSY
PREFILLED_SYRINGE | INTRAVENOUS | Status: AC
  Filled 2019-11-16: qty 10

## 2019-11-16 MED ORDER — ONDANSETRON HCL 4 MG/2ML IJ SOLN
INTRAMUSCULAR | Status: DC | PRN
  Administered 2019-11-16: 4 mg via INTRAVENOUS

## 2019-11-16 MED ORDER — METOPROLOL TARTRATE 5 MG/5ML IV SOLN
2.0000 mg | INTRAVENOUS | Status: DC | PRN
  Filled 2019-11-16: qty 5

## 2019-11-16 MED ORDER — PANTOPRAZOLE SODIUM 40 MG IV SOLR
40.0000 mg | Freq: Every day | INTRAVENOUS | Status: DC
  Administered 2019-11-16 – 2019-11-17 (×2): 40 mg via INTRAVENOUS
  Filled 2019-11-16 (×3): qty 40

## 2019-11-16 MED ORDER — SODIUM CHLORIDE 0.9 % IV SOLN
INTRAVENOUS | Status: DC | PRN
  Administered 2019-11-16: 25 ug/min via INTRAVENOUS

## 2019-11-16 MED ORDER — DEXAMETHASONE SODIUM PHOSPHATE 10 MG/ML IJ SOLN
INTRAMUSCULAR | Status: AC
  Filled 2019-11-16: qty 1

## 2019-11-16 MED ORDER — CEFAZOLIN SODIUM-DEXTROSE 2-4 GM/100ML-% IV SOLN
INTRAVENOUS | Status: AC
  Filled 2019-11-16: qty 100

## 2019-11-16 MED ORDER — HYDROMORPHONE HCL 1 MG/ML IJ SOLN
1.0000 mg | Freq: Once | INTRAMUSCULAR | Status: AC | PRN
  Administered 2019-11-16: 1 mg via INTRAVENOUS

## 2019-11-16 MED ORDER — ONDANSETRON HCL 4 MG/2ML IJ SOLN
INTRAMUSCULAR | Status: AC
  Filled 2019-11-16: qty 2

## 2019-11-16 MED ORDER — FENTANYL CITRATE (PF) 100 MCG/2ML IJ SOLN
INTRAMUSCULAR | Status: AC
  Filled 2019-11-16: qty 2

## 2019-11-16 MED ORDER — ACETAMINOPHEN 325 MG PO TABS
325.0000 mg | ORAL_TABLET | ORAL | Status: DC | PRN
  Administered 2019-11-17 – 2019-11-18 (×2): 650 mg via ORAL

## 2019-11-16 MED ORDER — MORPHINE SULFATE (PF) 2 MG/ML IV SOLN
2.0000 mg | INTRAVENOUS | Status: DC | PRN
  Administered 2019-11-17: 2 mg via INTRAVENOUS

## 2019-11-16 MED ORDER — ASPIRIN 81 MG PO CHEW
CHEWABLE_TABLET | ORAL | Status: AC
  Filled 2019-11-16: qty 1

## 2019-11-16 MED ORDER — ASPIRIN EC 81 MG PO TBEC
81.0000 mg | DELAYED_RELEASE_TABLET | Freq: Every day | ORAL | Status: DC
  Filled 2019-11-16: qty 1

## 2019-11-16 MED ORDER — FENTANYL CITRATE (PF) 100 MCG/2ML IJ SOLN
INTRAMUSCULAR | Status: AC
  Administered 2019-11-16: 25 ug
  Filled 2019-11-16: qty 2

## 2019-11-16 MED ORDER — ACETAMINOPHEN 325 MG RE SUPP
325.0000 mg | RECTAL | Status: DC | PRN
  Filled 2019-11-16: qty 2

## 2019-11-16 MED ORDER — LABETALOL HCL 5 MG/ML IV SOLN
10.0000 mg | INTRAVENOUS | Status: DC | PRN
  Filled 2019-11-16: qty 4

## 2019-11-16 MED ORDER — FENTANYL CITRATE (PF) 100 MCG/2ML IJ SOLN
INTRAMUSCULAR | Status: DC | PRN
Start: 2019-11-16 — End: 2019-11-16
  Administered 2019-11-16: 25 ug via INTRAVENOUS
  Administered 2019-11-16: 50 ug via INTRAVENOUS
  Administered 2019-11-16 (×2): 25 ug via INTRAVENOUS
  Administered 2019-11-16: 50 ug via INTRAVENOUS
  Administered 2019-11-16: 25 ug via INTRAVENOUS

## 2019-11-16 MED ORDER — ASPIRIN 81 MG PO CHEW: 324.0000 mg | CHEWABLE_TABLET | Freq: Once | ORAL | Status: DC

## 2019-11-16 MED ORDER — SODIUM CHLORIDE 0.9 % IV SOLN
INTRAVENOUS | Status: DC | PRN
  Administered 2019-11-16: 15:00:00 200 mL via INTRAMUSCULAR

## 2019-11-16 MED ORDER — OXYCODONE HCL 5 MG/5ML PO SOLN: 5.0000 mg | Freq: Once | ORAL | Status: DC | PRN

## 2019-11-16 MED ORDER — PHENYLEPHRINE HCL (PRESSORS) 10 MG/ML IV SOLN
INTRAVENOUS | Status: DC | PRN
  Administered 2019-11-16 (×3): 50 ug via INTRAVENOUS
  Administered 2019-11-16: 100 ug via INTRAVENOUS
  Administered 2019-11-16: 50 ug via INTRAVENOUS

## 2019-11-16 MED ORDER — SODIUM CHLORIDE (PF) 0.9 % IJ SOLN
INTRAMUSCULAR | Status: AC
  Filled 2019-11-16: qty 10

## 2019-11-16 MED ORDER — OXYCODONE HCL 5 MG PO TABS: 5.0000 mg | ORAL_TABLET | Freq: Once | ORAL | Status: DC | PRN

## 2019-11-16 MED ORDER — ASPIRIN EC 81 MG PO TBEC
81.0000 mg | DELAYED_RELEASE_TABLET | Freq: Every day | ORAL | Status: DC
  Administered 2019-11-16 – 2019-11-17 (×2): 81 mg via ORAL
  Filled 2019-11-16 (×5): qty 1

## 2019-11-16 MED ORDER — FENTANYL CITRATE (PF) 100 MCG/2ML IJ SOLN
25.0000 ug | INTRAMUSCULAR | Status: DC | PRN
  Administered 2019-11-16 (×4): 25 ug via INTRAVENOUS

## 2019-11-16 MED ORDER — BUSPIRONE HCL 15 MG PO TABS
7.5000 mg | ORAL_TABLET | Freq: Every day | ORAL | Status: DC
  Administered 2019-11-17 – 2019-11-18 (×2): 7.5 mg via ORAL
  Filled 2019-11-16 (×2): qty 1

## 2019-11-16 MED ORDER — ACETAMINOPHEN 10 MG/ML IV SOLN
INTRAVENOUS | Status: AC
  Filled 2019-11-16: qty 100

## 2019-11-16 MED ORDER — METOPROLOL TARTRATE 25 MG PO TABS
ORAL_TABLET | ORAL | Status: AC
  Administered 2019-11-16: 25 mg via ORAL
  Filled 2019-11-16: qty 1

## 2019-11-16 MED ORDER — PROPOFOL 10 MG/ML IV BOLUS
INTRAVENOUS | Status: DC | PRN
  Administered 2019-11-16: 100 mg via INTRAVENOUS
  Administered 2019-11-16: 50 mg via INTRAVENOUS

## 2019-11-16 MED ORDER — CHLORHEXIDINE GLUCONATE 0.12 % MT SOLN: 15.0000 mL | Freq: Once | OROMUCOSAL | Status: AC

## 2019-11-16 MED ORDER — SODIUM CHLORIDE 0.9 % IV SOLN: INTRAVENOUS | Status: DC

## 2019-11-16 MED ORDER — HYDRALAZINE HCL 20 MG/ML IJ SOLN
5.0000 mg | INTRAMUSCULAR | Status: DC | PRN
  Filled 2019-11-16: qty 0.25

## 2019-11-16 MED ORDER — ROSUVASTATIN CALCIUM 20 MG PO TABS
40.0000 mg | ORAL_TABLET | Freq: Every day | ORAL | Status: DC
  Administered 2019-11-16 – 2019-11-17 (×2): 40 mg via ORAL
  Filled 2019-11-16 (×3): qty 2

## 2019-11-16 MED ORDER — HEPARIN SODIUM (PORCINE) 1000 UNIT/ML IJ SOLN
INTRAMUSCULAR | Status: DC | PRN
  Administered 2019-11-16: 7000 [IU] via INTRAVENOUS

## 2019-11-16 MED ORDER — GUAIFENESIN-DM 100-10 MG/5ML PO SYRP
15.0000 mL | ORAL_SOLUTION | ORAL | Status: DC | PRN
  Filled 2019-11-16: qty 15

## 2019-11-16 MED ORDER — ASPIRIN 81 MG PO CHEW: 324.0000 mg | CHEWABLE_TABLET | Freq: Every day | ORAL | Status: DC

## 2019-11-16 MED ORDER — ACETAMINOPHEN 10 MG/ML IV SOLN
INTRAVENOUS | Status: DC | PRN
  Administered 2019-11-16: 1000 mg via INTRAVENOUS

## 2019-11-16 MED ORDER — ORAL CARE MOUTH RINSE: 15.0000 mL | Freq: Once | OROMUCOSAL | Status: AC

## 2019-11-16 MED ORDER — DEXMEDETOMIDINE (PRECEDEX) IN NS 20 MCG/5ML (4 MCG/ML) IV SYRINGE
PREFILLED_SYRINGE | INTRAVENOUS | Status: AC
  Filled 2019-11-16: qty 5

## 2019-11-16 MED ORDER — DOCUSATE SODIUM 100 MG PO CAPS
100.0000 mg | ORAL_CAPSULE | Freq: Every day | ORAL | Status: DC
  Administered 2019-11-17 – 2019-11-18 (×2): 100 mg via ORAL
  Filled 2019-11-16 (×2): qty 1

## 2019-11-16 MED ORDER — IPRATROPIUM-ALBUTEROL 0.5-2.5 (3) MG/3ML IN SOLN
RESPIRATORY_TRACT | Status: AC
  Filled 2019-11-16: qty 3

## 2019-11-16 MED ORDER — LIDOCAINE HCL (PF) 2 % IJ SOLN
INTRAMUSCULAR | Status: AC
  Filled 2019-11-16: qty 5

## 2019-11-16 MED ORDER — SUCCINYLCHOLINE CHLORIDE 20 MG/ML IJ SOLN
INTRAMUSCULAR | Status: DC | PRN
  Administered 2019-11-16: 100 mg via INTRAVENOUS

## 2019-11-16 MED ORDER — ROCURONIUM BROMIDE 100 MG/10ML IV SOLN
INTRAVENOUS | Status: DC | PRN
  Administered 2019-11-16: 20 mg via INTRAVENOUS
  Administered 2019-11-16: 40 mg via INTRAVENOUS
  Administered 2019-11-16: 20 mg via INTRAVENOUS

## 2019-11-16 MED ORDER — PHENOL 1.4 % MT LIQD
1.0000 | OROMUCOSAL | Status: DC | PRN
  Filled 2019-11-16: qty 177

## 2019-11-16 MED ORDER — IPRATROPIUM-ALBUTEROL 0.5-2.5 (3) MG/3ML IN SOLN
3.0000 mL | Freq: Four times a day (QID) | RESPIRATORY_TRACT | Status: DC
  Administered 2019-11-16 – 2019-11-17 (×2): 3 mL via RESPIRATORY_TRACT

## 2019-11-16 MED ORDER — HYDRALAZINE HCL 20 MG/ML IJ SOLN
10.0000 mg | INTRAMUSCULAR | Status: AC | PRN
  Administered 2019-11-16 (×2): 10 mg via INTRAVENOUS

## 2019-11-16 MED ORDER — OXYCODONE HCL 5 MG PO TABS: 5.0000 mg | ORAL_TABLET | ORAL | Status: DC | PRN

## 2019-11-16 MED ORDER — ROCURONIUM BROMIDE 10 MG/ML (PF) SYRINGE
PREFILLED_SYRINGE | INTRAVENOUS | Status: AC
  Filled 2019-11-16: qty 10

## 2019-11-16 MED ORDER — NITROGLYCERIN 0.2 MG/ML ON CALL CATH LAB
INTRAVENOUS | Status: DC | PRN
  Administered 2019-11-16: 20 ug via INTRAVENOUS
  Administered 2019-11-16: 40 ug via INTRAVENOUS
  Administered 2019-11-16 (×2): 20 ug via INTRAVENOUS
  Administered 2019-11-16: 40 ug via INTRAVENOUS
  Administered 2019-11-16 (×4): 20 ug via INTRAVENOUS
  Administered 2019-11-16 (×3): 40 ug via INTRAVENOUS
  Administered 2019-11-16: 10 ug via INTRAVENOUS
  Administered 2019-11-16: 20 ug via INTRAVENOUS

## 2019-11-16 MED ORDER — CHLORHEXIDINE GLUCONATE 0.12 % MT SOLN
OROMUCOSAL | Status: AC
  Administered 2019-11-16: 15 mL via OROMUCOSAL
  Filled 2019-11-16: qty 15

## 2019-11-16 MED ORDER — ALUM & MAG HYDROXIDE-SIMETH 200-200-20 MG/5ML PO SUSP: 15.0000 mL | ORAL | Status: DC | PRN

## 2019-11-16 MED ORDER — SODIUM CHLORIDE 0.9 % IV SOLN
500.0000 mg | INTRAVENOUS | Status: DC
  Administered 2019-11-16 – 2019-11-17 (×2): 500 mg via INTRAVENOUS
  Filled 2019-11-16 (×4): qty 500

## 2019-11-16 MED ORDER — FAMOTIDINE 20 MG PO TABS
ORAL_TABLET | ORAL | Status: AC
  Administered 2019-11-16: 20 mg via ORAL
  Filled 2019-11-16: qty 1

## 2019-11-16 MED ORDER — DEXAMETHASONE SODIUM PHOSPHATE 10 MG/ML IJ SOLN
INTRAMUSCULAR | Status: DC | PRN
  Administered 2019-11-16: 5 mg via INTRAVENOUS

## 2019-11-16 MED ORDER — ONDANSETRON HCL 4 MG/2ML IJ SOLN: 4.0000 mg | Freq: Four times a day (QID) | INTRAMUSCULAR | Status: DC | PRN

## 2019-11-16 MED ORDER — HYDRALAZINE HCL 20 MG/ML IJ SOLN
INTRAMUSCULAR | Status: AC
  Filled 2019-11-16: qty 1

## 2019-11-16 MED ORDER — KCL IN DEXTROSE-NACL 20-5-0.9 MEQ/L-%-% IV SOLN
INTRAVENOUS | Status: DC
  Filled 2019-11-16 (×5): qty 1000

## 2019-11-16 MED ORDER — SODIUM CHLORIDE 0.9 % IV SOLN: INTRAVENOUS | Status: DC | PRN

## 2019-11-16 MED ORDER — SODIUM CHLORIDE 0.9 % IV SOLN: 500.0000 mL | Freq: Once | INTRAVENOUS | Status: DC | PRN

## 2019-11-16 MED ORDER — METOPROLOL TARTRATE 25 MG PO TABS: 25.0000 mg | ORAL_TABLET | Freq: Two times a day (BID) | ORAL | Status: DC

## 2019-11-16 MED ORDER — SUGAMMADEX SODIUM 200 MG/2ML IV SOLN
INTRAVENOUS | Status: DC | PRN
  Administered 2019-11-16: 200 mg via INTRAVENOUS

## 2019-11-16 MED ORDER — FIBRIN SEALANT 2 ML SINGLE DOSE KIT
PACK | CUTANEOUS | Status: DC | PRN
  Administered 2019-11-16: 2 mL via TOPICAL

## 2019-11-16 MED ORDER — LIDOCAINE HCL (CARDIAC) PF 100 MG/5ML IV SOSY
PREFILLED_SYRINGE | INTRAVENOUS | Status: DC | PRN
  Administered 2019-11-16: 40 mg via INTRAVENOUS
  Administered 2019-11-16: 60 mg via INTRAVENOUS

## 2019-11-16 MED ORDER — CEFAZOLIN SODIUM-DEXTROSE 2-4 GM/100ML-% IV SOLN
2.0000 g | Freq: Three times a day (TID) | INTRAVENOUS | Status: AC
  Administered 2019-11-16: 2 g via INTRAVENOUS

## 2019-11-16 MED ORDER — DEXMEDETOMIDINE (PRECEDEX) IN NS 20 MCG/5ML (4 MCG/ML) IV SYRINGE
PREFILLED_SYRINGE | INTRAVENOUS | Status: DC | PRN
  Administered 2019-11-16: 4 ug via INTRAVENOUS

## 2019-11-16 MED ORDER — HYDROMORPHONE HCL 1 MG/ML IJ SOLN
INTRAMUSCULAR | Status: AC
Start: 2019-11-16 — End: 2019-11-17
  Filled 2019-11-16: qty 1

## 2019-11-16 MED ORDER — CEFAZOLIN SODIUM-DEXTROSE 2-4 GM/100ML-% IV SOLN
2.0000 g | Freq: Once | INTRAVENOUS | Status: AC
  Administered 2019-11-16: 2 g via INTRAVENOUS

## 2019-11-16 MED ORDER — LACTATED RINGERS IV SOLN: INTRAVENOUS | Status: DC

## 2019-11-16 MED ORDER — ASPIRIN 81 MG PO CHEW
CHEWABLE_TABLET | ORAL | Status: AC
  Filled 2019-11-16: qty 2

## 2019-11-16 SURGICAL SUPPLY — 66 items
APPLIER CLIP 11 MED OPEN (CLIP)
APPLIER CLIP 9.375 SM OPEN (CLIP)
BAG DECANTER FOR FLEXI CONT (MISCELLANEOUS) ×3 IMPLANT
BLADE SURG 15 STRL LF DISP TIS (BLADE) ×1 IMPLANT
BLADE SURG 15 STRL SS (BLADE) ×2
BLADE SURG SZ11 CARB STEEL (BLADE) ×3 IMPLANT
BOOT SUTURE AID YELLOW STND (SUTURE) ×6 IMPLANT
BRUSH SCRUB EZ  4% CHG (MISCELLANEOUS) ×2
BRUSH SCRUB EZ 4% CHG (MISCELLANEOUS) ×1 IMPLANT
CANISTER SUCT 1200ML W/VALVE (MISCELLANEOUS) ×3 IMPLANT
CHLORAPREP W/TINT 26 (MISCELLANEOUS) ×3 IMPLANT
CLIP APPLIE 11 MED OPEN (CLIP) IMPLANT
CLIP APPLIE 9.375 SM OPEN (CLIP) IMPLANT
COVER WAND RF STERILE (DRAPES) ×3 IMPLANT
DERMABOND ADVANCED (GAUZE/BANDAGES/DRESSINGS) ×2
DERMABOND ADVANCED .7 DNX12 (GAUZE/BANDAGES/DRESSINGS) ×1 IMPLANT
DRAPE 3/4 80X56 (DRAPES) ×3 IMPLANT
DRAPE INCISE IOBAN 66X45 STRL (DRAPES) ×3 IMPLANT
DRAPE LAPAROTOMY 100X77 ABD (DRAPES) ×3 IMPLANT
DRESSING SURGICEL FIBRLLR 1X2 (HEMOSTASIS) ×1 IMPLANT
DRSG SURGICEL FIBRILLAR 1X2 (HEMOSTASIS) ×3
ELECT CAUTERY BLADE 6.4 (BLADE) ×3 IMPLANT
ELECT REM PT RETURN 9FT ADLT (ELECTROSURGICAL) ×3
ELECTRODE REM PT RTRN 9FT ADLT (ELECTROSURGICAL) ×1 IMPLANT
GLOVE BIO SURGEON STRL SZ7 (GLOVE) ×12 IMPLANT
GLOVE INDICATOR 7.5 STRL GRN (GLOVE) ×12 IMPLANT
GLOVE SURG SYN 8.0 (GLOVE) ×3 IMPLANT
GOWN STRL REUS W/ TWL LRG LVL3 (GOWN DISPOSABLE) ×2 IMPLANT
GOWN STRL REUS W/ TWL XL LVL3 (GOWN DISPOSABLE) ×1 IMPLANT
GOWN STRL REUS W/TWL LRG LVL3 (GOWN DISPOSABLE) ×4
GOWN STRL REUS W/TWL XL LVL3 (GOWN DISPOSABLE) ×2
HOLDER FOLEY CATH W/STRAP (MISCELLANEOUS) ×3 IMPLANT
IV NS 500ML (IV SOLUTION) ×2
IV NS 500ML BAXH (IV SOLUTION) ×1 IMPLANT
KIT TURNOVER KIT A (KITS) ×3 IMPLANT
LABEL OR SOLS (LABEL) ×3 IMPLANT
LOOP RED MAXI  1X406MM (MISCELLANEOUS) ×4
LOOP VESSEL MAXI 1X406 RED (MISCELLANEOUS) ×2 IMPLANT
LOOP VESSEL MINI 0.8X406 BLUE (MISCELLANEOUS) ×1 IMPLANT
LOOPS BLUE MINI 0.8X406MM (MISCELLANEOUS) ×2
NEEDLE FILTER BLUNT 18X 1/2SAF (NEEDLE) ×2
NEEDLE FILTER BLUNT 18X1 1/2 (NEEDLE) ×1 IMPLANT
NEEDLE HYPO 25X1 1.5 SAFETY (NEEDLE) ×3 IMPLANT
NS IRRIG 500ML POUR BTL (IV SOLUTION) ×3 IMPLANT
PACK BASIN MAJOR ARMC (MISCELLANEOUS) ×3 IMPLANT
PATCH CAROTID ECM VASC 1X10 (Prosthesis & Implant Heart) ×3 IMPLANT
PENCIL ELECTRO HAND CTR (MISCELLANEOUS) IMPLANT
SHUNT CAROTID STR REINF 3.0X4. (MISCELLANEOUS) ×3 IMPLANT
SUT MNCRL+ 5-0 UNDYED PC-3 (SUTURE) ×2 IMPLANT
SUT MONOCRYL 5-0 (SUTURE) ×4
SUT PROLENE 6 0 BV (SUTURE) ×45 IMPLANT
SUT PROLENE 7 0 BV 1 (SUTURE) ×6 IMPLANT
SUT SILK 2 0 (SUTURE) ×2
SUT SILK 2-0 18XBRD TIE 12 (SUTURE) ×1 IMPLANT
SUT SILK 3 0 (SUTURE) ×2
SUT SILK 3-0 18XBRD TIE 12 (SUTURE) ×1 IMPLANT
SUT SILK 4 0 (SUTURE) ×2
SUT SILK 4-0 18XBRD TIE 12 (SUTURE) ×1 IMPLANT
SUT VIC AB 3-0 SH 27 (SUTURE) ×2
SUT VIC AB 3-0 SH 27X BRD (SUTURE) ×1 IMPLANT
SYR 10ML LL (SYRINGE) ×3 IMPLANT
SYR 20ML LL LF (SYRINGE) ×3 IMPLANT
SYR 3ML LL SCALE MARK (SYRINGE) ×3 IMPLANT
TRAY FOLEY MTR SLVR 16FR STAT (SET/KITS/TRAYS/PACK) ×3 IMPLANT
TUBING CONNECTING 10 (TUBING) IMPLANT
TUBING CONNECTING 10' (TUBING)

## 2019-11-16 NOTE — Interval H&P Note (Signed)
History and Physical Interval Note:  11/16/2019 10:30 AM  Shannon Chen  has presented today for surgery, with the diagnosis of CAROTID ARTERY STENOSIS.  The various methods of treatment have been discussed with the patient and family. After consideration of risks, benefits and other options for treatment, the patient has consented to  Procedure(s): ENDARTERECTOMY CAROTID (Right) as a surgical intervention.  The patient's history has been reviewed, patient examined, no change in status, stable for surgery.  I have reviewed the patient's chart and labs.  Questions were answered to the patient's satisfaction.     Levora Dredge

## 2019-11-16 NOTE — Telephone Encounter (Signed)
Patient's Lasix was discontinued during admission as long-term diuretic therapy should be avoided in patients with hypertrophic cardiomyopathy as this worsens the gradient potentially leading to syncope.  Lisinopril was discontinued as we try to avoid medications that reduce afterload for similar reasons.  Please see the patient's discharge summary which indicates these medications were discontinued.  She should continue the recently started metoprolol.

## 2019-11-16 NOTE — Telephone Encounter (Signed)
Spoke with Bonita Quin and she states that patients medications were discontinued and she was calling to find out who and why. Advised that we did not stop any medications from what I could see but I do see when she was discharged from the hospital that there were some changes made. Advised I would send over to provider for review and we would be in touch with any changes or recommendations. She verbalized understanding and had no further questions at this time. She lives with patient but no DPR on file.

## 2019-11-16 NOTE — Telephone Encounter (Signed)
Spoke with patients caregiver and reviewed providers review and she requested that I please send this information to her primary care provider Rexene Agent. Advised that I would send this to them and to let me know if any further questions.

## 2019-11-16 NOTE — Anesthesia Preprocedure Evaluation (Signed)
Anesthesia Evaluation  Patient identified by MRN, date of birth, ID band Patient awake    Reviewed: Allergy & Precautions, H&P , NPO status , Patient's Chart, lab work & pertinent test results  History of Anesthesia Complications Negative for: history of anesthetic complications  Airway Mallampati: III  TM Distance: >3 FB Neck ROM: limited    Dental  (+) Chipped, Poor Dentition, Missing, Loose   Pulmonary neg shortness of breath, former smoker,    Pulmonary exam normal        Cardiovascular hypertension, Normal cardiovascular exam+ dysrhythmias + pacemaker      Neuro/Psych negative neurological ROS  negative psych ROS   GI/Hepatic negative GI ROS,   Endo/Other  negative endocrine ROS  Renal/GU Renal disease     Musculoskeletal   Abdominal   Peds  Hematology negative hematology ROS (+)   Anesthesia Other Findings Past Medical History: No date: Arthritis No date: AV block, Mobitz II     Comment:  a. 09/2016 s/p MDT [id number] Azure XT DR MRI DC PPM No date: Carotid artery disease ([name])     Comment:  a. 09/2016 Carotid U/S: RICA 70-99%, LICA 50-69%;  b.               09/2016 CTA Neck: RICA 60, RCCA 30, LICA 70%, L Vert 60, L              Basilar 70. No date: Essential hypertension No date: Hyperlipidemia No date: LVH (left ventricular hypertrophy)     Comment:  a. 09/2016 Echo: EF 60-65%, no rwma, Gr1 DD, Ca2+ MV               annulus; b. 11/2017 Echo: EF 70-75%, no rwma, Gr1 DD,               mild AS vs dynamic LVOT obs. Nl RV fxn. Hyperdynamic LVEF              w/ sev LVH. No date: Presence of permanent cardiac pacemaker  Past Surgical History: No date: ABDOMINAL HYSTERECTOMY 10/08/2016: PACEMAKER IMPLANT; N/A     Comment:  Procedure: Pacemaker Implant;  Surgeon: Marinus Maw,              MD;  Location: [facility] IN[facility]ASIVE CV LAB;  Service:               Cardiovascular;  Laterality: N/A;      Reproductive/Obstetrics negative OB ROS                             Anesthesia Physical Anesthesia Plan  ASA: IV  Anesthesia Plan: General ETT   Post-op Pain Management:    Induction: Intravenous  PONV Risk Score and Plan: Ondansetron, Dexamethasone, Midazolam and Treatment may vary due to age or medical condition  Airway Management Planned: Oral ETT and Video Laryngoscope Planned  Additional Equipment: Arterial line  Intra-op Plan:   Post-operative Plan: Extubation in OR  Informed Consent: I have reviewed the patients History and Physical, chart, labs and discussed the procedure including the risks, benefits and alternatives for the proposed anesthesia with the patient or authorized representative who has indicated his/her understanding and acceptance.     Dental Advisory Given  Plan Discussed with: Anesthesiologist, CRNA and Surgeon  Anesthesia Plan Comments: (Patient consented for risks of anesthesia including but not limited to:  - adverse reactions to medications - damage to eyes, teeth, lips or other oral mucosa -  nerve damage due to positioning  - sore throat or hoarseness - Damage to heart, brain, nerves, lungs, other parts of body or loss of life  Patient voiced understanding.)        Anesthesia Quick Evaluation

## 2019-11-16 NOTE — Op Note (Signed)
[facility]   OPERATIVE NOTE  PROCEDURE:   1. Right carotid endarterectomy with CorMatrix arterial patch reconstruction in association with resection of 15 mm of the right internal carotid artery with the primary end-to-end anastomosis  PRE-OPERATIVE DIAGNOSIS: 1.  Critical carotid stenosis   POST-OPERATIVE DIAGNOSIS: same as above   SURGEON: Renford Dills, MD  ASSISTANT(S): Ms. Raul Del  ANESTHESIA: general  ESTIMATED BLOOD LOSS: 150 cc  FINDING(S): 1.  Extensive calcified carotid plaque associated with marked redundancy and a kink of the internal carotid artery just distal to the plaque.  SPECIMEN(S):  Carotid plaque in association with resected arterial wall (sent to Pathology)  INDICATIONS:   ZAHRIYAH JOO is a 74 y.o. y.o. female who presents with right carotid stenosis of greater than 90%.  The risks, benefits, and alternatives to carotid endarterectomy were discussed with the patient. The differences between carotid stenting and carotid endarterectomy were reviewed.  The patient voiced understanding and appears to be aware that the risks of carotid endarterectomy include but are not limited to: bleeding, infection, stroke, myocardial infarction, death, cranial nerve injuries both temporary and permanent, neck hematoma, possible airway compromise, labile blood pressure post-operatively, cerebral hyperperfusion syndrome, and possible need for additional interventions in the future. The patient is aware of the risks and agrees to proceed forward with the procedure.  DESCRIPTION: After full informed written consent was obtained from the patient, the patient was brought back to the operating room and placed supine upon the operating table.  Prior to induction, the patient received IV antibiotics.  After obtaining adequate anesthesia, the patient was placed a supine position with a shoulder roll in place and the patient's neck slightly hyperextended  and rotated away from the surgical site.  The patient was prepped in the standard fashion for a carotid endarterectomy.    A first assistant was required to provide a safe and appropriate environment for executing the surgery.  The assistant was integral in providing retraction, exposure, running suture providing suction and in the closing process.  The incision was made anterior to the sternocleidomastoid muscle and dissected down through the subcutaneous tissue.  The platysmas was opened with electrocautery.  The internal jugular vein and facial vein were identified.  The facial vein is ligated and divided between 2-0 silk ties.  The omohyoid was identified in the common carotid artery exposed at this level. The dissection was there in carried out along the carotid artery in a cranial direction.  The dissection was then carried along periadventitial plane along the common carotid artery up to the bifurcation. The external carotid artery was identified. Vessel loops were then placed around the external carotid artery as well as the superior thyroid artery. In the process of this dissection, the hypoglossal nerve was identified and protected from harm.  The internal carotid artery was then dissected circumferentially just beyond an area in the internal carotid artery distal to the plaque.    At this point, we gave the patient 7000 units of intravenous heparin.  After this was allowed to circulate for several minutes, the common carotid followed by the external carotid and then the internal carotid artery were clamped.  Arteriotomy was made in the common carotid artery with a 11 blade, and extended the arteriotomy with a Potts scissor down into the common carotid artery, then the arteriotomy was carried through the bifurcation into the internal carotid artery until I reached an area that was not diseased.  At this point, a  Sundt shunt was placed.  The endarterectomy was begun in the common carotid artery  with a Runner, broadcasting/film/video and carried this dissection down into the common carotid artery circumferentially.  Then I transected the plaque at a segment where it was adherent and transected the plaque with Potts scissors.  I then carried this dissection up into the external carotid artery.  The plaque was extracted by unclamping the external carotid artery and performing an eversion endarterectomy.  The dissection was then carried into the internal carotid artery where a  feathered end point was created.  The plaque was passed off the field as a specimen.  The distal endpoint was tacked down with 6 interrupted 7-0 Prolene sutures.    Once the plaque had been completely removed I now had to address the redundancy and kink. The internal carotid artery was then pulled down to meet the carotid bulb near the origin of the external carotid artery. The redundant carotid wall was then assessed Potts scissors was used to resect this segment and the internal carotid artery was then reattached to the carotid bulb using 8 interrupted 6-0 Prolene sutures. Once the suture line was complete I proceeded with standard patch repair of the endarterectomy.  A CorMatrix arterial patch was delivered onto the field and trimmed appropriately for the artery and sewed in place with 6-0 Prolene using a 4 quadrant technique.  The medial suture line was completed and the lateral suture line was run approximately one quarter the length of the arteriotomy.  Prior to completing this patch angioplasty, the shunt was removed, the internal carotid artery was flushed and there was excellent backbleeding.  The carotid artery repair was flushed with heparinized saline and then the patch angioplasty was completed in the usual fashion.  The flow was then reestablished first to the external carotid artery and then the internal carotid artery to prevent distal embolization.   Several minutes of pressure were held and 6-0 Prolene patch sutures were used  as need for hemostasis.  At this point, I placed Surgicel and Evicel topical hemostatic agents.  There was no more active bleeding in the surgical site.  The sternocleidomastoid space was closed with three interrupted 3-0 Vicryl sutures. I then reapproximated the platysma muscle with a running stitch of 3-0 Vicryl.  The skin was then closed with a running subcuticular 4-0 Monocryl.  The skin was then cleaned, dried and Dermabond was used to reinforce the skin closure.  The patient awakened and was taken to the recovery room in stable condition, following commands and moving all four extremities without any apparent deficits.    COMPLICATIONS: none  CONDITION: stable  Levora Dredge 11/16/2019<3:18 PM

## 2019-11-16 NOTE — H&P (Signed)
@LOGO @   MRN :  Shannon Chen is a 74 y.o. (05-Dec-1945) female who presents with chief complaint of blocked carotid artery.  History of Present Illness:   The patient presents to [facility] today for treatment of her critically stenotic right internal carotid artery.  She was last seen in the office on October 13, 2019 for follow up evaluation of carotid stenosis and review of her CT angiogram. CT scan was done 10/12/2019.  There has been no further episodes of syncope since this visit.  The patient denies interval amaurosis fugax. There is no recent or interval TIA symptoms or focal motor deficits. There is no prior documented CVA.  The patient is taking enteric-coated aspirin 81 mg daily.  There is no history of migraine headaches. There is no history of seizures.  The patient has a history of coronary artery disease, no recent episodes of angina or shortness of breath. The patient denies PAD or claudication symptoms. There is a history of hyperlipidemia which is being treated with a statin.   CT angiogram is reviewed by me personally and shows >80% RICA and 70% LICA stenosis. The right lesion is heavily calcified.  I do not concur with the radiologist, I believe that this is an significant under read of the lesion  Current Meds  Medication Sig  . acetaminophen (TYLENOL) 325 MG tablet Take 650 mg by mouth every 6 (six) hours as needed.  10/14/2019 amLODipine (NORVASC) 2.5 MG tablet Take 2 tablets (5 mg total) by mouth daily.  Marland Kitchen aspirin EC 81 MG EC tablet Take 1 tablet (81 mg total) by mouth daily. Swallow whole.  . busPIRone (BUSPAR) 7.5 MG tablet Take 7.5 mg by mouth daily.   . metoprolol tartrate (LOPRESSOR) 25 MG tablet Take 1 tablet (25 mg total) by mouth 2 (two) times daily.  . rosuvastatin (CRESTOR) 40 MG tablet Take 1 tablet (40 mg total) by mouth daily.    Past Medical History:  Diagnosis Date  . Arthritis   . AV block, Mobitz II    a. 09/2016 s/p MDT 10/2016 Azure XT DR  MRI DC PPM  . Carotid artery disease ([name])    a. 09/2016 Carotid U/S: RICA 70-99%, LICA 50-69%;  b. 09/2016 CTA Neck: RICA 60, RCCA 30, LICA 70%, L Vert 60, L Basilar 70.  . Essential hypertension   . Hyperlipidemia   . LVH (left ventricular hypertrophy)    a. 09/2016 Echo: EF 60-65%, no rwma, Gr1 DD, Ca2+ MV annulus; b. 11/2017 Echo: EF 70-75%, no rwma, Gr1 DD, mild AS vs dynamic LVOT obs. Nl RV fxn. Hyperdynamic LVEF w/ sev LVH.  . Presence of permanent cardiac pacemaker     Past Surgical History:  Procedure Laterality Date  . ABDOMINAL HYSTERECTOMY    . PACEMAKER IMPLANT N/A 10/08/2016   Procedure: Pacemaker Implant;  Surgeon: 10/10/2016, MD;  Location: [facility] INVASIVE CV LAB;  Service: Cardiovascular;  Laterality: N/A;    Social History Social History   Tobacco Use  . Smoking status: Former Smoker    Packs/day: 1.00    Years: 30.00    Pack years: 30.00    Types: Cigarettes    Quit date: 11/09/2018    Years since quitting: 1.0  . Smokeless tobacco: Never Used  Vaping Use  . Vaping Use: Never used  Substance Use Topics  . Alcohol use: No  . Drug use: No    Family History Family History  Problem Relation Age of Onset  . Diabetes  Mother   . CAD Father     No Known Allergies   REVIEW OF SYSTEMS (Negative unless checked)  Constitutional: Weight loss  Fever  Chills Cardiac: Chest pain   Chest pressure   Palpitations   Shortness of breath when laying flat   Shortness of breath with exertion. Vascular:  Pain in legs with walking   Pain in legs at rest  History of DVT   Phlebitis   Swelling in legs   Varicose veins   Non-healing ulcers Pulmonary:   Uses home oxygen   Productive cough   Hemoptysis   Wheeze  COPD   Asthma Neurologic:  Dizziness   Seizures   History of stroke   History of TIA  Aphasia   Vissual changes   Weakness or numbness in arm   Weakness or numbness in leg Musculoskeletal:   Joint swelling    Joint pain   Low back pain Hematologic:  Easy bruising  Easy bleeding   Hypercoagulable state   Anemic Gastrointestinal:  Diarrhea   Vomiting  Gastroesophageal reflux/heartburn   Difficulty swallowing. Genitourinary:  Chronic kidney disease   Difficult urination  Frequent urination   Blood in urine Skin:  Rashes   Ulcers  Psychological:  History of anxiety    History of major depression.  Physical Examination  Vitals:   11/16/19 1016  BP: 105/64  Pulse: (!) 59  Resp: 16  Temp: (!) 97.4 F (36.3 C)  TempSrc: Temporal  SpO2: 99%   There is no height or weight on file to calculate BMI. Gen: WD/WN, NAD Head: [place]/AT, No temporalis wasting.  Ear/Nose/Throat: Hearing grossly intact, nares w/o erythema or drainage Eyes: PER, EOMI, sclera nonicteric.  Neck: Supple, no large masses.   Pulmonary:  Good air movement, no audible wheezing bilaterally, no use of accessory muscles.  Cardiac: RRR, no JVD Vascular: Bilateral carotid bruits are auscultated Vessel Right Left  Radial Palpable Palpable  Carotid Palpable Palpable  Gastrointestinal: Non-distended. No guarding/no peritoneal signs.  Musculoskeletal: M/S 5/5 throughout.  No deformity or atrophy.  Neurologic: CN 2-12 intact. Symmetrical.  Speech is fluent. Motor exam as listed above. Psychiatric: Judgment intact, Mood & affect appropriate for pt's clinical situation. Dermatologic: No rashes or ulcers noted.  No changes consistent with cellulitis.  CBC Lab Results  Component Value Date   WBC 7.6 11/07/2019   HGB 11.3 (L) 11/07/2019   HCT 33.5 (L) 11/07/2019   MCV 86.3 11/07/2019   PLT 291 11/07/2019    BMET    Component Value Date/Time   NA 141 11/08/2019 0550   K 3.5 11/08/2019 0550   CL 109 11/08/2019 0550   CO2 25 11/08/2019 0550   GLUCOSE 112 (H) 11/08/2019 0550   BUN 17 11/08/2019 0550   CREATININE 1.03 (H) 11/08/2019 0550   CALCIUM 8.5 (L) 11/08/2019 0550   GFRNONAA 53 (L)  11/08/2019 0550   GFRAA >60 11/08/2019 0550   Estimated Creatinine Clearance: 46.2 mL/min (A) (by C-G formula based on SCr of 1.03 mg/dL (H)).  COAG Lab Results  Component Value Date   INR 1.04 10/06/2016    Radiology CT Head Wo Contrast  Result Date: 11/06/2019 CLINICAL DATA:  Syncopal episodes EXAM: CT HEAD WITHOUT CONTRAST TECHNIQUE: Contiguous axial images were obtained from the base of the skull through the vertex without intravenous contrast. COMPARISON:  April 24, 2018 FINDINGS: Brain: No evidence of acute territorial infarction, hemorrhage, hydrocephalus,extra-axial collection or mass lesion/mass effect. There is dilatation the ventricles and sulci consistent with  age-related atrophy. Low-attenuation changes in the deep white matter consistent with small vessel ischemia. Bilateral basal ganglia lacunar infarcts are seen. Vascular: No hyperdense vessel or unexpected calcification. Skull: The skull is intact. No fracture or focal lesion identified. Sinuses/Orbits: Ethmoid air cell mucosal thickening is seen. The orbits and globes intact. Other: None IMPRESSION: No acute intracranial abnormality. Findings consistent with age related atrophy and chronic small vessel ischemia Stable bilateral basal ganglia lacunar infarcts. Electronically Signed   By: Jonna ClarkBindu  Avutu M.D.   On: 11/06/2019 18:28   NM Myocar Multi W/Spect W/Wall Motion / EF  Result Date: 10/26/2019  ST segment depression was noted during stress in the V3, V4, V5, V6, I, II, III and aVF leads.  T wave inversion was noted during stress. T wave inversion persisted.  The study is normal.  This is a low risk study.  The left ventricular ejection fraction is normal (61%).  There is no evidence for ischemia    ECHOCARDIOGRAM COMPLETE  Result Date: 11/08/2019    ECHOCARDIOGRAM REPORT   Patient Name:   Romie JumperVINNIE M Jarvie Date of Exam: 11/08/2019 Medical Rec #:  [id number]       Height:       65.5 in Accession #:    [id number]      Weight:        162.5 lb Date of Birth:  30-Apr-1945       BSA:          1.821 m Patient Age:    74 years        BP:           135/68 mmHg Patient Gender: F               HR:           63 bpm. Exam Location:  [facility] Procedure: 2D Echo, Cardiac Doppler and Color Doppler Indications:     Syncope 780.2  History:         Patient has prior history of Echocardiogram examinations, most                  recent 04/25/2018. Risk Factors:Dyslipidemia and Hypertension. AV                  block mobitz II, LVH.  Sonographer:     Cristela BlueJerry Hege RDCS (AE) Referring Phys:  [id number] Sondra BargesYAN M DUNN Diagnosing Phys: Yvonne Kendallhristopher End MD  Sonographer Comments: Suboptimal parasternal window. IMPRESSIONS  1. Left ventricular ejection fraction, by estimation, is 70 to 75%. The left ventricle has hyperdynamic function. The left ventricle has no regional wall motion abnormalities. There is severe left ventricular hypertrophy. Left ventricular diastolic parameters are consistent with Grade I diastolic dysfunction (impaired relaxation). Elevated left atrial pressure.  2. Right ventricular systolic function is mildly reduced. The right ventricular size is normal. There is normal pulmonary artery systolic pressure.  3. The mitral valve is normal in structure. Trivial mitral valve regurgitation. No evidence of mitral stenosis.  4. The aortic valve has an indeterminant number of cusps. There is mild calcification of the aortic valve. There is moderate thickening of the aortic valve. Aortic valve regurgitation is not visualized. Mild to moderate aortic valve sclerosis/calcification is present, without any evidence of aortic stenosis.  5. The inferior vena cava is normal in size with greater than 50% respiratory variability, suggesting right atrial pressure of 3 mmHg. FINDINGS  Left Ventricle: Left ventricular ejection fraction, by estimation, is 70 to 75%. The left ventricle  has hyperdynamic function. The left ventricle has no regional wall motion abnormalities. The left  ventricular internal cavity size was small. There is severe left ventricular hypertrophy. Left ventricular diastolic parameters are consistent with Grade I diastolic dysfunction (impaired relaxation). Elevated left atrial pressure. Right Ventricle: The right ventricular size is normal. No increase in right ventricular wall thickness. Right ventricular systolic function is mildly reduced. There is normal pulmonary artery systolic pressure. The tricuspid regurgitant velocity is 2.43 m/s, and with an assumed right atrial pressure of 3 mmHg, the estimated right ventricular systolic pressure is 26.6 mmHg. Left Atrium: Left atrial size was normal in size. Right Atrium: Right atrial size was normal in size. Pericardium: There is no evidence of pericardial effusion. Mitral Valve: The mitral valve is normal in structure. Mild mitral annular calcification. Trivial mitral valve regurgitation. No evidence of mitral valve stenosis. Tricuspid Valve: The tricuspid valve is not well visualized. Tricuspid valve regurgitation is trivial. Aortic Valve: The aortic valve has an indeterminant number of cusps. There is mild calcification of the aortic valve. There is moderate thickening of the aortic valve. Aortic valve regurgitation is not visualized. Mild to moderate aortic valve sclerosis/calcification is present, without any evidence of aortic stenosis. Aortic valve mean gradient measures 6.0 mmHg. Aortic valve peak gradient measures 10.3 mmHg. Aortic valve area, by VTI measures 2.05 cm. Pulmonic Valve: The pulmonic valve was not well visualized. Pulmonic valve regurgitation is not visualized. No evidence of pulmonic stenosis. Aorta: The aortic root is normal in size and structure. Pulmonary Artery: The pulmonary artery is not well seen. Venous: The inferior vena cava is normal in size with greater than 50% respiratory variability, suggesting right atrial pressure of 3 mmHg. IAS/Shunts: The interatrial septum was not well visualized.  Additional Comments: A pacer wire is visualized in the right ventricle.  LEFT VENTRICLE PLAX 2D LVIDd:         3.17 cm  Diastology LVIDs:         2.76 cm  LV e' medial:    3.92 cm/s LV PW:         1.47 cm  LV E/e' medial:  15.5 LV IVS:        1.80 cm  LV e' lateral:   3.81 cm/s LVOT diam:     2.00 cm  LV E/e' lateral: 16.0 LV SV:         63 LV SV Index:   34 LVOT Area:     3.14 cm  RIGHT VENTRICLE RV Basal diam:  3.71 cm RV S prime:     8.92 cm/s TAPSE (M-mode): 1.7 cm LEFT ATRIUM             Index       RIGHT ATRIUM           Index LA diam:        2.80 cm 1.54 cm/m  RA Area:     10.70 cm LA Vol (A2C):   28.7 ml 15.76 ml/m RA Volume:   24.80 ml  13.62 ml/m LA Vol (A4C):   45.4 ml 24.93 ml/m LA Biplane Vol: 37.6 ml 20.64 ml/m  AORTIC VALVE                    PULMONIC VALVE AV Area (Vmax):    1.93 cm     PV Vmax:        0.54 m/s AV Area (Vmean):   1.92 cm     PV Peak grad:  1.1 mmHg AV Area (VTI):     2.05 cm     RVOT Peak grad: 3 mmHg AV Vmax:           160.33 cm/s AV Vmean:          111.667 cm/s AV VTI:            0.305 m AV Peak Grad:      10.3 mmHg AV Mean Grad:      6.0 mmHg LVOT Vmax:         98.60 cm/s LVOT Vmean:        68.400 cm/s LVOT VTI:          0.199 m LVOT/AV VTI ratio: 0.65  AORTA Ao Root diam: 2.40 cm MITRAL VALVE               TRICUSPID VALVE MV Area (PHT): 2.97 cm    TR Peak grad:   23.6 mmHg MV Decel Time: 255 msec    TR Vmax:        243.00 cm/s MV E velocity: 60.80 cm/s MV A velocity: 86.50 cm/s  SHUNTS MV E/A ratio:  0.70        Systemic VTI:  0.20 m                            Systemic Diam: 2.00 cm Yvonne Kendall MD Electronically signed by Yvonne Kendall MD Signature Date/Time: 11/08/2019/3:04:54 PM    Final      Assessment/Plan 1. Carotid stenosis, bilateral Recommend:  The patient remains asymptomatic with respect to the carotid stenosis.  However, the patient has now progressed and has a lesion the is >80%.  Patient's CT angiography of the carotid arteries confirms  >80% right ICA stenosis.  The anatomical considerations support surgery over stenting.  This was discussed in detail with the patient.  The patient does indeed need surgery, therefore, cardiac clearance will be arranged. Once cleared the patient will be scheduled for surgery.  The risks, benefits and alternative therapies were reviewed in detail with the patient.  All questions were answered.  The patient agrees to proceed with surgery of the right carotid artery.  Continue antiplatelet therapy as prescribed. Continue management of CAD, HTN and Hyperlipidemia. Healthy heart diet, encouraged exercise at least 4 times per week.   2. Syncope and collapse See #1  3. Essential hypertension Continue antihypertensive medications as already ordered, these medications have been reviewed and there are no changes at this time.  4. Dyslipidemia Continue statin as ordered and reviewed, no changes at this time  Levora Dredge, MD  11/16/2019 10:25 AM

## 2019-11-16 NOTE — Transfer of Care (Signed)
Immediate Anesthesia Transfer of Care Note  Patient: Shannon Chen  Procedure(s) Performed: ENDARTERECTOMY CAROTID (Right )  Patient Location: PACU  Anesthesia Type:General  Level of Consciousness: sedated  Airway & Oxygen Therapy: Patient Spontanous Breathing and Patient connected to face mask oxygen  Post-op Assessment: Report given to RN and Post -op Vital signs reviewed and stable  Post vital signs: Reviewed and stable  Last Vitals:  Vitals Value Taken Time  BP 149/66 11/16/19 1553  Temp 36.4 C 11/16/19 1539  Pulse 83 11/16/19 1553  Resp 13 11/16/19 1553  SpO2 100 % 11/16/19 1553  Vitals shown include unvalidated device data.  Last Pain:  Vitals:   11/16/19 1553  TempSrc:   PainSc: 0-No pain         Complications: No complications documented.

## 2019-11-16 NOTE — Anesthesia Procedure Notes (Addendum)
Arterial Line Insertion Start/End9/29/2021 12:20 PM, 11/16/2019 12:30 PM Performed by: Rosaria Ferries, MD, Karoline Caldwell, CRNA, CRNA  Patient location: OR. Preanesthetic checklist: patient identified, IV checked, site marked, risks and benefits discussed, surgical consent, monitors and equipment checked, pre-op evaluation, timeout performed and anesthesia consent Patient sedated Right, radial was placed Catheter size: 20 Fr Hand hygiene performed , maximum sterile barriers used  and Seldinger technique used  Attempts: 1 Procedure performed without using ultrasound guided technique. Following insertion, dressing applied. Post procedure assessment: normal and unchanged  Patient tolerated the procedure well with no immediate complications. Additional procedure comments: Performed by Geoffery Spruce Best.

## 2019-11-16 NOTE — Telephone Encounter (Signed)
Linda from [employer] [facility] came by office  States that Dr [name] discontinued many of the patients medications but they did not receive any word of this - also states PCP Rexene Agent did not know  Would need confirmation or a signature from Dr [name] indicating these changes Fax to [phone] or call Bonita Quin [phone]

## 2019-11-16 NOTE — OR Nursing (Signed)
Dr. Gilda Crease at bedside and states patient looks good.  He told me to go by the cuff pressures and not the A line.

## 2019-11-16 NOTE — Anesthesia Procedure Notes (Addendum)
Procedure Name: Intubation Date/Time: 11/16/2019 12:24 PM Performed by: Felix Ahmadi, RN Pre-anesthesia Checklist: Patient identified, Patient being monitored, Timeout performed, Emergency Drugs available and Suction available Patient Re-evaluated:Patient Re-evaluated prior to induction Oxygen Delivery Method: Circle system utilized Preoxygenation: Pre-oxygenation with 100% oxygen Induction Type: IV induction Ventilation: Mask ventilation without difficulty Laryngoscope Size: 3 and McGraph Grade View: Grade I Tube type: Oral Tube size: 7.0 mm Number of attempts: 1 Airway Equipment and Method: Stylet Placement Confirmation: ETT inserted through vocal cords under direct vision,  positive ETCO2 and breath sounds checked- equal and bilateral Secured at: 21 cm Tube secured with: Tape Dental Injury: Teeth and Oropharynx as per pre-operative assessment

## 2019-11-16 NOTE — Consult Note (Addendum)
CRITICAL CARE CONSULT NOTE    Name: Shannon Chen MRN: [id number] DOB: 1945/10/09     LOS: 0  Referring physician: Dr Gilda CreaseSchnier  SUBJECTIVE FINDINGS & SIGNIFICANT EVENTS    Patient description:  This is a pleasant 74 yo with HTN, lifelong smoker, hx of arrythmias s/p pace maker placement hx of CAD s/p CEA who was noted to have abnormal vital signs with hypotension post operatively.  She also went in to respiratory distress and had increased O2 requirement. Critical care consultation was placed by vascular team to evaluate and manage post operative circulatory shock.  Patient was lethargic during interview but she just had general anesthesia.   Lines/tubes : Arterial Line 11/16/19 Right Radial (Active)  Site Assessment Clean;Dry 11/16/19 1539  Art Line Waveform Appropriate 11/16/19 1539  Art Line Interventions Zeroed and calibrated;Leveled 11/16/19 1539  Color/Movement/Sensation Capillary refill less than 3 sec 11/16/19 1539  Dressing Type Transparent 11/16/19 1539  Dressing Status Clean;Dry 11/16/19 1539     Urethral Catheter Stevan Bornerry Duncan RN Double-lumen;Latex 16 Fr. (Active)    Microbiology/Sepsis markers: Results for orders placed or performed during the hospital encounter of 11/16/19  Respiratory Panel by RT PCR (Flu A&B, Covid) - Nasopharyngeal Swab     Status: None   Collection Time: 11/16/19 10:20 AM   Specimen: Nasopharyngeal Swab  Result Value Ref Range Status   SARS Coronavirus 2 by RT PCR NEGATIVE NEGATIVE Final    Comment: (NOTE) SARS-CoV-2 target nucleic acids are NOT DETECTED.  The SARS-CoV-2 RNA is generally detectable in upper respiratoy specimens during the acute phase of infection. The lowest concentration of SARS-CoV-2 viral copies this assay can detect is 131 copies/mL. A negative  result does not preclude SARS-Cov-2 infection and should not be used as the sole basis for treatment or other patient management decisions. A negative result may occur with  improper specimen collection/handling, submission of specimen other than nasopharyngeal swab, presence of viral mutation(s) within the areas targeted by this assay, and inadequate number of viral copies (<131 copies/mL). A negative result must be combined with clinical observations, patient history, and epidemiological information. The expected result is Negative.  Fact Sheet for Patients:  https://www.moore.com/https://www.fda.gov/media/142436/download  Fact Sheet for Healthcare Providers:  https://www.young.biz/https://www.fda.gov/media/142435/download  This test is no t yet approved or cleared by the [place] FDA and  has been authorized for detection and/or diagnosis of SARS-CoV-2 by FDA under an Emergency Use Authorization (EUA). This EUA will remain  in effect (meaning this test can be used) for the duration of the COVID-19 declaration under Section 564(b)(1) of the Act, 21 U.S.C. section 360bbb-3(b)(1), unless the authorization is terminated or revoked sooner.     Influenza A by PCR NEGATIVE NEGATIVE Final   Influenza B by PCR NEGATIVE NEGATIVE Final    Comment: (NOTE) The Xpert Xpress SARS-CoV-2/FLU/RSV assay is intended as an aid in  the diagnosis of influenza from Nasopharyngeal swab specimens and  should not be used as a sole basis for treatment. Nasal washings and  aspirates are unacceptable for Xpert Xpress SARS-CoV-2/FLU/RSV  testing.  Fact Sheet for Patients: https://www.moore.com/https://www.fda.gov/media/142436/download  Fact Sheet for Healthcare Providers: https://www.young.biz/https://www.fda.gov/media/142435/download  This test is not yet approved or cleared by the [place] FDA and  has been authorized for detection and/or diagnosis of SARS-CoV-2 by  FDA under an Emergency Use Authorization (EUA). This EUA will remain  in effect (meaning this test can be used)  for the duration of the  Covid-19 declaration under Section 564(b)(1) of the Act,  21  U.S.C. section 360bbb-3(b)(1), unless the authorization is  terminated or revoked. Performed at [facility], [address] [place], [place] [address]     Anti-infectives:  Anti-infectives (From admission, onward)   Start     Dose/Rate Route Frequency Ordered Stop   11/16/19 1215  ceFAZolin (ANCEF) IVPB 2g/100 mL premix        2 g 200 mL/hr over 30 Minutes Intravenous  Once 11/16/19 1204 11/16/19 1230   11/16/19 1205  ceFAZolin (ANCEF) 2-4 GM/100ML-% IVPB       Note to Pharmacy: Manfred Arch   : cabinet override      11/16/19 1205 11/16/19 1259         PAST MEDICAL HISTORY   Past Medical History:  Diagnosis Date  . Arthritis   . AV block, Mobitz II    a. 09/2016 s/p MDT [id number] Azure XT DR MRI DC PPM  . Carotid artery disease ([name])    a. 09/2016 Carotid U/S: RICA 70-99%, LICA 50-69%;  b. 09/2016 CTA Neck: RICA 60, RCCA 30, LICA 70%, L Vert 60, L Basilar 70.  . Essential hypertension   . Hyperlipidemia   . LVH (left ventricular hypertrophy)    a. 09/2016 Echo: EF 60-65%, no rwma, Gr1 DD, Ca2+ MV annulus; b. 11/2017 Echo: EF 70-75%, no rwma, Gr1 DD, mild AS vs dynamic LVOT obs. Nl RV fxn. Hyperdynamic LVEF w/ sev LVH.  . Presence of permanent cardiac pacemaker      SURGICAL HISTORY   Past Surgical History:  Procedure Laterality Date  . ABDOMINAL HYSTERECTOMY    . PACEMAKER IMPLANT N/A 10/08/2016   Procedure: Pacemaker Implant;  Surgeon: Marinus Maw, MD;  Location: [facility] INVASIVE CV LAB;  Service: Cardiovascular;  Laterality: N/A;     FAMILY HISTORY   Family History  Problem Relation Age of Onset  . Diabetes Mother   . CAD Father      SOCIAL HISTORY   Social History   Tobacco Use  . Smoking status: Former Smoker    Packs/day: 1.00    Years: 30.00    Pack years: 30.00    Types: Cigarettes    Quit date: 11/09/2018    Years since quitting: 1.0  . Smokeless  tobacco: Never Used  Vaping Use  . Vaping Use: Never used  Substance Use Topics  . Alcohol use: No  . Drug use: No     MEDICATIONS   Current Medication:  Current Facility-Administered Medications:  .  [START ON 11/17/2019] amLODipine (NORVASC) tablet 5 mg, 5 mg, Oral, Daily, Schnier, Latina Craver, MD .  aspirin 81 MG chewable tablet, , , ,  .  aspirin 81 MG chewable tablet, , , ,  .  aspirin 81 MG chewable tablet, , , ,  .  aspirin chewable tablet 324 mg, 324 mg, Oral, Once, Naomie Dean, MD .  Melene Muller ON 11/17/2019] aspirin EC tablet 81 mg, 81 mg, Oral, Daily, Schnier, Latina Craver, MD .  Melene Muller ON 11/17/2019] busPIRone (BUSPAR) tablet 7.5 mg, 7.5 mg, Oral, Daily, Schnier, Latina Craver, MD .  fentaNYL (SUBLIMAZE) 100 MCG/2ML injection, , , ,  .  fentaNYL (SUBLIMAZE) 100 MCG/2ML injection, , , ,  .  fentaNYL (SUBLIMAZE) injection 25-50 mcg, 25-50 mcg, Intravenous, Q5 min PRN, Piscitello, Cleda Mccreedy, MD, 25 mcg at 11/16/19 1631 .  hydrALAZINE (APRESOLINE) 20 MG/ML injection, , , ,  .  metoprolol tartrate (LOPRESSOR) tablet 25 mg, 25 mg, Oral, BID, Schnier, Latina Craver, MD .  oxyCODONE (Oxy IR/ROXICODONE) immediate release tablet 5 mg, 5 mg, Oral, Once PRN **OR** oxyCODONE (ROXICODONE) 5 MG/5ML solution 5 mg, 5 mg, Oral, Once PRN, Piscitello, Cleda Mccreedy, MD .  rosuvastatin (CRESTOR) tablet 40 mg, 40 mg, Oral, Daily, Schnier, Latina Craver, MD    ALLERGIES   Patient has no known allergies.    REVIEW OF SYSTEMS    10 point ROS unable to complete due to lethragy  PHYSICAL EXAMINATION   Vital Signs: Temp:  [97.4 F (36.3 C)-97.5 F (36.4 C)] 97.5 F (36.4 C) (09/29 1539) Pulse Rate:  [59-89] 85 (09/29 1740) Resp:  [10-20] 16 (09/29 1740) BP: (105-186)/(52-74) 136/59 (09/29 1740) SpO2:  [99 %-100 %] 100 % (09/29 1740) Arterial Line BP: (94-146)/(45-83) 146/56 (09/29 1740)  GENERAL:age appropriate HEAD: Normocephalic, atraumatic.  EYES: Pupils equal, round, reactive to light.  No  scleral icterus.  MOUTH: Moist mucosal membrane. NECK: Supple. No thyromegaly. No nodules. No JVD. +righ neck CEA wound PULMONARY: crackles bilaterally  CARDIOVASCULAR: S1 and S2. Regular rate and rhythm. No murmurs, rubs, or gallops.  GASTROINTESTINAL: Soft, nontender, non-distended. No masses. Positive bowel sounds. No hepatosplenomegaly.  MUSCULOSKELETAL: No swelling, clubbing, or edema.  NEUROLOGIC: Mild distress due to acute illness GCS10 SKIN:intact,warm,dry   PERTINENT DATA     Infusions:  Scheduled Medications: . [START ON 11/17/2019] amLODipine  5 mg Oral Daily  . aspirin      . aspirin      . aspirin      . aspirin  324 mg Oral Once  . [START ON 11/17/2019] aspirin EC  81 mg Oral Daily  . [START ON 11/17/2019] busPIRone  7.5 mg Oral Daily  . fentaNYL      . fentaNYL      . hydrALAZINE      . metoprolol tartrate  25 mg Oral BID  . rosuvastatin  40 mg Oral Daily   PRN Medications: fentaNYL (SUBLIMAZE) injection, oxyCODONE **OR** oxyCODONE Hemodynamic parameters:   Intake/Output: No intake/output data recorded.  Ventilator  Settings:     LAB RESULTS:  Basic Metabolic Panel: No results for input(s): NA, K, CL, CO2, GLUCOSE, BUN, CREATININE, CALCIUM, MG, PHOS in the last 168 hours. Liver Function Tests: No results for input(s): AST, ALT, ALKPHOS, BILITOT, PROT, ALBUMIN in the last 168 hours. No results for input(s): LIPASE, AMYLASE in the last 168 hours. No results for input(s): AMMONIA in the last 168 hours. CBC: No results for input(s): WBC, NEUTROABS, HGB, HCT, MCV, PLT in the last 168 hours. Cardiac Enzymes: No results for input(s): CKTOTAL, CKMB, CKMBINDEX, TROPONINI in the last 168 hours. BNP: Invalid input(s): POCBNP CBG: Recent Labs  Lab 11/16/19 1040 11/16/19 1606  GLUCAP 94 201*       IMAGING RESULTS:    ASSESSMENT AND PLAN    -Multidisciplinary rounds held today  Acute Hypoxic Respiratory Failure -due to mild-moderate pulmonary  edema as evidenced by cxr above with concomitant weak respiratory drive post anesthesia and baseline emphysema  -q6h duoneb while awake -due to abnormal CXR and history of emphysema/copd - will start empiric zithromax for COPD since she has increased O2 requirement  Circulatory shock   - will monitor only for now    - after my evaluation patient had improvement in vital signs and no intervention was required     -serial interval 12 lead EKGs reviewed before and after transient hypotension with findigs of LVH, prolonged QTc, RBBB which are all chronic there were no ischemic changes.      -  after BP is stable we will diurese gently  ICU telemetry monitoring   Carotid stenosis  S/p CEA  -patient on Cephalexin   ID -continue IV abx as prescibed -follow up cultures  GI/Nutrition GI PROPHYLAXIS as indicated DIET-->TF's as tolerated Constipation protocol as indicated  ENDO - ICU hypoglycemic\Hyperglycemia protocol -check FSBS per protocol   ELECTROLYTES -follow labs as needed -replace as needed -pharmacy consultation   DVT/GI PRX ordered -SCDs  TRANSFUSIONS AS NEEDED MONITOR FSBS ASSESS the need for LABS as needed   Critical care provider statement:    Critical care time (minutes):  33   Critical care time was exclusive of:  Separately billable procedures and treating other patients   Critical care was necessary to treat or prevent imminent or life-threatening deterioration of the following conditions:  acute hypoxemic respiratory failure, circulatory shock, emphysema, multiple comorbid conditions.    Critical care was time spent personally by me on the following activities:  Development of treatment plan with patient or surrogate, discussions with consultants, evaluation of patient's response to treatment, examination of patient, obtaining history from patient or surrogate, ordering and performing treatments and interventions, ordering and review of laboratory studies and  re-evaluation of patient's condition.  I assumed direction of critical care for this patient from another provider in my specialty: no    This document was prepared using Dragon voice recognition software and may include unintentional dictation errors.    Vida Rigger, M.D.  Division of Pulmonary & Critical Care Medicine  [facility]

## 2019-11-17 ENCOUNTER — Encounter: Payer: Self-pay | Admitting: Vascular Surgery

## 2019-11-17 DIAGNOSIS — R0602 Shortness of breath: Secondary | ICD-10-CM

## 2019-11-17 LAB — BASIC METABOLIC PANEL
Anion gap: 8 (ref 5–15)
BUN: 14 mg/dL (ref 8–23)
CO2: 23 mmol/L (ref 22–32)
Calcium: 7.7 mg/dL — ABNORMAL LOW (ref 8.9–10.3)
Chloride: 108 mmol/L (ref 98–111)
Creatinine, Ser: 0.87 mg/dL (ref 0.44–1.00)
GFR calc Af Amer: >60 mL/min (ref >60)
GFR calc non Af Amer: >60 mL/min (ref >60)
Glucose, Bld: 169 mg/dL — ABNORMAL HIGH (ref 70–99)
Potassium: 3.9 mmol/L (ref 3.5–5.1)
Sodium: 139 mmol/L (ref 135–145)

## 2019-11-17 LAB — CBC
HCT: 27.7 % — ABNORMAL LOW (ref 36.0–46.0)
Hemoglobin: 9.8 g/dL — ABNORMAL LOW (ref 12.0–15.0)
MCH: 29.4 pg (ref 26.0–34.0)
MCHC: 35.4 g/dL (ref 30.0–36.0)
MCV: 83.2 fL (ref 80.0–100.0)
Platelets: 258 10*3/uL (ref 150–400)
RBC: 3.33 MIL/uL — ABNORMAL LOW (ref 3.87–5.11)
RDW: 12.4 % (ref 11.5–15.5)
WBC: 10.5 10*3/uL (ref 4.0–10.5)
nRBC: 0 % (ref 0.0–0.2)

## 2019-11-17 MED ORDER — IPRATROPIUM-ALBUTEROL 0.5-2.5 (3) MG/3ML IN SOLN
RESPIRATORY_TRACT | Status: AC
  Administered 2019-11-17: 3 mL via RESPIRATORY_TRACT
  Filled 2019-11-17: qty 3

## 2019-11-17 MED ORDER — METOPROLOL TARTRATE 25 MG PO TABS
ORAL_TABLET | ORAL | Status: AC
  Administered 2019-11-17: 25 mg via ORAL
  Filled 2019-11-17: qty 1

## 2019-11-17 MED ORDER — IPRATROPIUM-ALBUTEROL 0.5-2.5 (3) MG/3ML IN SOLN
RESPIRATORY_TRACT | Status: AC
  Filled 2019-11-17: qty 3

## 2019-11-17 MED ORDER — ACETAMINOPHEN 500 MG PO TABS
ORAL_TABLET | ORAL | Status: AC
  Filled 2019-11-17: qty 2

## 2019-11-17 MED ORDER — OXYCODONE HCL 5 MG PO TABS
ORAL_TABLET | ORAL | Status: AC
Start: 2019-11-17 — End: 2019-11-17
  Administered 2019-11-17: 5 mg via ORAL
  Filled 2019-11-17: qty 1

## 2019-11-17 MED ORDER — ACETAMINOPHEN 325 MG PO TABS
ORAL_TABLET | ORAL | Status: AC
  Filled 2019-11-17: qty 2

## 2019-11-17 MED ORDER — CEFAZOLIN SODIUM-DEXTROSE 2-4 GM/100ML-% IV SOLN
INTRAVENOUS | Status: AC
  Administered 2019-11-17: 2 g via INTRAVENOUS
  Filled 2019-11-17: qty 100

## 2019-11-17 MED ORDER — OXYCODONE HCL 5 MG PO TABS
5.0000 mg | ORAL_TABLET | Freq: Four times a day (QID) | ORAL | Status: DC | PRN
  Administered 2019-11-18: 5 mg via ORAL

## 2019-11-17 MED ORDER — MORPHINE SULFATE (PF) 2 MG/ML IV SOLN
INTRAVENOUS | Status: AC
  Filled 2019-11-17: qty 1

## 2019-11-17 NOTE — Anesthesia Postprocedure Evaluation (Signed)
Anesthesia Post Note  Patient: Shannon Chen  Procedure(s) Performed: ENDARTERECTOMY CAROTID (Right )  Patient location during evaluation: Nursing Unit Anesthesia Type: General Level of consciousness: awake, awake and alert and oriented Pain management: pain level controlled Vital Signs Assessment: post-procedure vital signs reviewed and stable Respiratory status: spontaneous breathing, respiratory function stable and nonlabored ventilation Cardiovascular status: blood pressure returned to baseline and stable Postop Assessment: no headache Anesthetic complications: no   No complications documented.   Last Vitals:  Vitals:   11/17/19 0600 11/17/19 0730  BP: (!) 161/67 (!) 173/70  Pulse: 74 78  Resp: 16 16  Temp:  (!) 36.3 C  SpO2: 96% 97%    Last Pain:  Vitals:   11/17/19 0749  TempSrc:   PainSc: 4                  Chiropodist

## 2019-11-17 NOTE — Evaluation (Signed)
Physical Therapy Evaluation Patient Details Name: Shannon Chen MRN: [id number] DOB: 1945-09-09 Today's Date: 11/17/2019   History of Present Illness  Shannon Chen is a 74 y.o. female with medical history significant for Mobitz type II second-degree AV block s/p PPM, carotid artery disease, RBBB, hypertension, and hyperlipidemia who presented last week after syncopal episode.  Now s/p endarectomy.    Clinical Impression  Pt did well with post-op PT exam, she was able to ambulate ~100 ft w/ only light use of the QC and overall showed good effort, tolerance somewhat limited as she felt weak ("I have had almost nothing to eat for the last 24 hours").  Pt's vitals stable t/o the effort with O2 in the high 90s, HR remaining 70-80s and appropriate (not orthostatic despite some c/o lightheadedness).  Pt reports feeling confident with being able to return to her group home once she gets feeling better, apparently she already has HHPT coming out and it is appropriate for her to continue this.  No overt issues that would preclude her return from PT stand-point.   Follow Up Recommendations Home health PT (pt apparently has HHPT currently set up)    Equipment Recommendations  None recommended by PT    Recommendations for Other Services       Precautions / Restrictions Precautions Precautions: Fall Restrictions Weight Bearing Restrictions: No      Mobility  Bed Mobility               General bed mobility comments: in recliner on arrival, returned to recliner post ambulation  Transfers Overall transfer level: Modified independent Equipment used: Quad cane Transfers: Sit to/from Stand Sit to Stand: Min guard         General transfer comment: Pt able to slowly but confidently rise to standing with minimal realince on UEs  Ambulation/Gait Ambulation/Gait assistance: Min guard Gait Distance (Feet): 100 Feet Assistive device: Quad cane       General Gait Details: Pt with slow  but confident ambulaiton, she was not overly reliant on the QC.  She did report subective fatigue but overall did well.  Her HR stayed in the 80s t/o the effort (nursing reports that HR did rise >100 with ambulation earlier)  Stairs            Wheelchair Mobility    Modified Rankin (Stroke Patients Only)       Balance Overall balance assessment: Modified Independent   Sitting balance-Leahy Scale: Good       Standing balance-Leahy Scale: Good Standing balance comment: no LOBs during transition to standing and during ambulation                             Pertinent Vitals/Pain Pain Assessment: 0-10 Pain Score: 5  Pain Location: surgical site, R neck    Home Living Family/patient expects to be discharged to:: Group home Living Arrangements: Group Home               Additional Comments: has been using QC as they "require" her to at group home, no steps to enter, has assist with ALDs, errands,etc    Prior Function                 Hand Dominance        Extremity/Trunk Assessment   Upper Extremity Assessment Upper Extremity Assessment: Overall WFL for tasks assessed;Generalized weakness    Lower Extremity Assessment Lower Extremity Assessment: Overall [facility]  for tasks assessed       Communication      Cognition Arousal/Alertness: Awake/alert Behavior During Therapy: [facility] for tasks assessed/performed Overall Cognitive Status: Within Functional Limits for tasks assessed                                        General Comments      Exercises     Assessment/Plan    PT Assessment Patient needs continued PT services  PT Problem List Decreased strength;Decreased mobility;Decreased safety awareness;Decreased activity tolerance;Decreased balance;Decreased knowledge of use of DME       PT Treatment Interventions DME instruction;Therapeutic exercise;Gait training;Balance training;Neuromuscular re-education;Functional  mobility training;Therapeutic activities;Patient/family education    PT Goals (Current goals can be found in the Care Plan section)  Acute Rehab PT Goals Patient Stated Goal: to go home and get stronger PT Goal Formulation: With patient Time For Goal Achievement: 12/01/19 Potential to Achieve Goals: Good    Frequency Min 2X/week   Barriers to discharge        Co-evaluation               AM-PAC PT "6 Clicks" Mobility  Outcome Measure Help needed turning from your back to your side while in a flat bed without using bedrails?: None Help needed moving from lying on your back to sitting on the side of a flat bed without using bedrails?: None Help needed moving to and from a bed to a chair (including a wheelchair)?: A Little Help needed standing up from a chair using your arms (e.g., wheelchair or bedside chair)?: A Little Help needed to walk in hospital room?: A Little Help needed climbing 3-5 steps with a railing? : A Little 6 Click Score: 20    End of Session Equipment Utilized During Treatment: Gait belt Activity Tolerance: Patient tolerated treatment well Patient left: in chair;with call bell/phone within reach Nurse Communication: Mobility status PT Visit Diagnosis: Unsteadiness on feet (R26.81);Difficulty in walking, not elsewhere classified (R26.2);Muscle weakness (generalized) (M62.81)    Time: 6803-2122 PT Time Calculation (min) (ACUTE ONLY): 22 min   Charges:   PT Evaluation $PT Eval Low Complexity: 1 Low PT Treatments $Gait Training: 8-22 mins        Malachi Pro, DPT 11/17/2019, 4:51 PM

## 2019-11-17 NOTE — OR Nursing (Addendum)
Went in to check on patient and give a duo neb treatment, patient complaining of some neck pain, swelling noted to surgery site, upon palpating noted to be hard to touch under incision. Dr. Gilda Crease called and notified, no new orders at this time just continue to monitor.  Patient swelling marked with skin marker. Will continue to monitor. No signs of troubled breathing, repositioned patient and sat her up more.

## 2019-11-17 NOTE — Progress Notes (Signed)
[facility] Daily Progress Note   Subjective: 11/16/19: 1. Right carotid endarterectomy with CorMatrix arterial patch reconstruction in association with resection of 15 mm of the right internal carotid artery with the primary end-to-end anastomosis  Patient without complaint this AM  Objective: Vitals:   11/17/19 0600 11/17/19 0730 11/17/19 0839 11/17/19 0939  BP: (!) 161/67 (!) 173/70 (!) 166/70 (!) 137/49  Pulse: 74 78 77 80  Resp: 16 16 14 16   Temp:  (!) 97.4 F (36.3 C)    TempSrc:  Temporal    SpO2: 96% 97% 98% 97%    Intake/Output Summary (Last 24 hours) at 11/17/2019 0959 Last data filed at 11/17/2019 0600 Gross per 24 hour  Intake 2100 ml  Output 1930 ml  Net 170 ml   Physical Exam: A&Ox3, NAD Face: Symmetrical, tongue midline Neck:  Hematoma noted.  Trachea midline CV: RRR Pulmonary: CTA Bilaterally Abdomen: Soft, Nontender, Nondistended Foley: Clear urine Vascular: warm distally to toes   Laboratory: CBC    Component Value Date/Time   WBC 10.5 11/17/2019 0620   HGB 9.8 (L) 11/17/2019 0620   HCT 27.7 (L) 11/17/2019 0620   PLT 258 11/17/2019 0620   BMET    Component Value Date/Time   NA 139 11/17/2019 0620   K 3.9 11/17/2019 0620   CL 108 11/17/2019 0620   CO2 23 11/17/2019 0620   GLUCOSE 169 (H) 11/17/2019 0620   BUN 14 11/17/2019 0620   CREATININE 0.87 11/17/2019 0620   CALCIUM 7.7 (L) 11/17/2019 0620   GFRNONAA >60 11/17/2019 0620   GFRAA >60 11/17/2019 0620   Assessment/Planning: Shannon Chen is a 74 year old female who presents with right carotid stenosis of greater than 90% s/p right carotid endarterectomy POD#1  1) Hematoma noted on exam today. No SOB / dysphagia. 2) CBC in AM 3) D/c foley 4) OOB / Ambulation today 5) Possible d/c tomorrow  Discussed with Dr. 73 Seniah Lawrence PA-C 11/17/2019 9:59 AM

## 2019-11-18 LAB — BASIC METABOLIC PANEL
Anion gap: 7 (ref 5–15)
BUN: 12 mg/dL (ref 8–23)
CO2: 25 mmol/L (ref 22–32)
Calcium: 8.3 mg/dL — ABNORMAL LOW (ref 8.9–10.3)
Chloride: 109 mmol/L (ref 98–111)
Creatinine, Ser: 0.74 mg/dL (ref 0.44–1.00)
GFR calc Af Amer: >60 mL/min (ref >60)
GFR calc non Af Amer: >60 mL/min (ref >60)
Glucose, Bld: 108 mg/dL — ABNORMAL HIGH (ref 70–99)
Potassium: 3.5 mmol/L (ref 3.5–5.1)
Sodium: 141 mmol/L (ref 135–145)

## 2019-11-18 LAB — CBC
HCT: 26.2 % — ABNORMAL LOW (ref 36.0–46.0)
Hemoglobin: 8.8 g/dL — ABNORMAL LOW (ref 12.0–15.0)
MCH: 29.4 pg (ref 26.0–34.0)
MCHC: 33.6 g/dL (ref 30.0–36.0)
MCV: 87.6 fL (ref 80.0–100.0)
Platelets: 234 10*3/uL (ref 150–400)
RBC: 2.99 MIL/uL — ABNORMAL LOW (ref 3.87–5.11)
RDW: 12.4 % (ref 11.5–15.5)
WBC: 6.6 10*3/uL (ref 4.0–10.5)
nRBC: 0 % (ref 0.0–0.2)

## 2019-11-18 LAB — SURGICAL PATHOLOGY

## 2019-11-18 LAB — MAGNESIUM: Magnesium: 2 mg/dL (ref 1.7–2.4)

## 2019-11-18 MED ORDER — ACETAMINOPHEN 325 MG PO TABS
ORAL_TABLET | ORAL | Status: AC
  Administered 2019-11-18: 650 mg via ORAL
  Filled 2019-11-18: qty 2

## 2019-11-18 MED ORDER — IPRATROPIUM-ALBUTEROL 0.5-2.5 (3) MG/3ML IN SOLN
RESPIRATORY_TRACT | Status: AC
  Filled 2019-11-18: qty 3

## 2019-11-18 MED ORDER — OXYCODONE HCL 5 MG PO TABS
5.0000 mg | ORAL_TABLET | Freq: Four times a day (QID) | ORAL | 0 refills | Status: DC | PRN
Start: 2019-11-18 — End: 2020-05-18

## 2019-11-18 MED ORDER — OXYCODONE HCL 5 MG PO TABS
ORAL_TABLET | ORAL | Status: AC
  Filled 2019-11-18: qty 1

## 2019-11-18 MED ORDER — METOPROLOL TARTRATE 25 MG PO TABS
ORAL_TABLET | ORAL | Status: AC
  Administered 2019-11-18: 25 mg via ORAL
  Filled 2019-11-18: qty 1

## 2019-11-18 MED ORDER — ACETAMINOPHEN 325 MG PO TABS
ORAL_TABLET | ORAL | Status: AC
  Filled 2019-11-18: qty 2

## 2019-11-18 NOTE — Discharge Instructions (Signed)
May shower as of tomorrow.  Gently clean your incision with soap and water.  Gently pat dry. No heavy lifting greater than 10 pounds until cleared at your first postoperative visit

## 2019-11-18 NOTE — Progress Notes (Signed)
Nsg Discharge Note  Admit Date:  11/16/2019 Discharge date: 11/18/2019   Shannon Chen to be D/C'd Home per MD order.  AVS completed.   Report called to [facility] and transportation set up via facility.   Discharge Medication: Allergies as of 11/18/2019   No Known Allergies     Medication List    TAKE these medications   acetaminophen 325 MG tablet Commonly known as: TYLENOL Take 650 mg by mouth every 6 (six) hours as needed.   amLODipine 2.5 MG tablet Commonly known as: NORVASC Take 2 tablets (5 mg total) by mouth daily.   aspirin 81 MG EC tablet Take 1 tablet (81 mg total) by mouth daily. Swallow whole.   busPIRone 7.5 MG tablet Commonly known as: BUSPAR Take 7.5 mg by mouth daily.   metoprolol tartrate 25 MG tablet Commonly known as: LOPRESSOR Take 1 tablet (25 mg total) by mouth 2 (two) times daily.   oxyCODONE 5 MG immediate release tablet Commonly known as: Oxy IR/ROXICODONE Take 1 tablet (5 mg total) by mouth every 6 (six) hours as needed for moderate pain or severe pain.   rosuvastatin 40 MG tablet Commonly known as: CRESTOR Take 1 tablet (40 mg total) by mouth daily.       Discharge Assessment: Vitals:   11/18/19 0524 11/18/19 0954  BP: (!) 143/72 (!) 159/64  Pulse: 81 74  Resp: 13 (!) 23  Temp: 98.7 F (37.1 C)   SpO2: 96% 92%   Skin clean, dry and intact without evidence of skin break down, no evidence of skin tears noted. IV catheter discontinued intact. Site without signs and symptoms of complications - no redness or edema noted at insertion site, patient denies c/o pain - only slight tenderness at site.  Dressing with slight pressure applied.  D/c Instructions-Education: Discharge instructions given to patient/family with verbalized understanding. D/c education completed with patient/family including follow up instructions, medication list, d/c activities limitations if indicated, with other d/c instructions as indicated by MD - patient able  to verbalize understanding, all questions fully answered. Patient instructed to return to ED, call 911, or call MD for any changes in condition.  Patient escorted via WC, and D/C home via facility transportation.  Theodore Demark, RN 11/18/2019 2:35 PM

## 2019-11-18 NOTE — Progress Notes (Signed)
Attempted to contact group home twice.  Received call back from facility stating they would have someone pick patient up at 2:00pm.

## 2019-11-18 NOTE — Discharge Summary (Signed)
[facility]    Discharge Summary  Patient ID:  Shannon Chen MRN: [id number] DOB/AGE: February 16, 1946 74 y.o.  Admit date: 11/16/2019 Discharge date: 11/18/2019 Date of Surgery: 11/16/2019 Surgeon: Surgeon(s): Schnier, Latina Craver, MD  Admission Diagnosis: Carotid stenosis, asymptomatic, right [I65.21]  Discharge Diagnoses:  Carotid stenosis, asymptomatic, right [I65.21]  Secondary Diagnoses: Past Medical History:  Diagnosis Date  . Arthritis   . AV block, Mobitz II    a. 09/2016 s/p MDT [id number] Azure XT DR MRI DC PPM  . Carotid artery disease ([name])    a. 09/2016 Carotid U/S: RICA 70-99%, LICA 50-69%;  b. 09/2016 CTA Neck: RICA 60, RCCA 30, LICA 70%, L Vert 60, L Basilar 70.  . Essential hypertension   . Hyperlipidemia   . LVH (left ventricular hypertrophy)    a. 09/2016 Echo: EF 60-65%, no rwma, Gr1 DD, Ca2+ MV annulus; b. 11/2017 Echo: EF 70-75%, no rwma, Gr1 DD, mild AS vs dynamic LVOT obs. Nl RV fxn. Hyperdynamic LVEF w/ sev LVH.  . Presence of permanent cardiac pacemaker    Procedure(s): 11/16/19: 1.Rightcarotid endarterectomy with CorMatrix arterial patch reconstructionin association with resection of 15 mm of the right internal carotid artery with the primary end-to-end anastomosis  Discharged Condition: Good  HPI / Hospital Course:  Shannon Chen is a 74 y.o. y.o. female who presents with right carotid stenosis of greater than 90%.  The risks, benefits, and alternatives to carotid endarterectomy were discussed with the patient. The differences between carotid stenting and carotid endarterectomy were reviewed.  The patient voiced understanding and appears to be aware that the risks of carotid endarterectomy include but are not limited to: bleeding, infection, stroke, myocardial infarction, death, cranial nerve injuries both temporary and permanent, neck hematoma, possible airway compromise, labile blood pressure post-operatively, cerebral hyperperfusion  syndrome, and possible need for additional interventions in the future. The patient is aware of the risks and agrees to proceed forward with the procedure. On 11/16/19 the patient underwent:  1.Rightcarotid endarterectomy with CorMatrix arterial patch reconstructionin association with resection of 15 mm of the right internal carotid artery with the primary end-to-end anastomosis  She tolerated procedure well and was transferred from the operating room to recovery without issue.  Patient's night of surgery was unremarkable.  Patient noted to have small hematoma to the incision line.  Patient stated postop day #1 to monitor hematoma.  During the patient's stay, her diet was advanced, her pain was controlled the use of p.o. pain medication, she was urinating and she was ambulating at baseline.  Day of discharge, patient was afebrile with stable vital signs.  Physical exam:  A&Ox3, NAD Face:Symmetrical,tongue midline Neck: Hematoma noted.  Trachea midline CV: RRR Pulmonary: CTA Bilaterally Abdomen: Soft, Nontender, Nondistended Foley: Clear urine Vascular:warm distally to toes  Labs: As below  Complications: None  Consults: None  Significant Diagnostic Studies: CBC Lab Results  Component Value Date   WBC 6.6 11/18/2019   HGB 8.8 (L) 11/18/2019   HCT 26.2 (L) 11/18/2019   MCV 87.6 11/18/2019   PLT 234 11/18/2019   BMET    Component Value Date/Time   NA 141 11/18/2019 0619   K 3.5 11/18/2019 0619   CL 109 11/18/2019 0619   CO2 25 11/18/2019 0619   GLUCOSE 108 (H) 11/18/2019 0619   BUN 12 11/18/2019 0619   CREATININE 0.74 11/18/2019 0619   CALCIUM 8.3 (L) 11/18/2019 0619   GFRNONAA >60 11/18/2019 0619   GFRAA >60 11/18/2019 0175  COAG Lab Results  Component Value Date   INR 1.04 10/06/2016   Disposition:  Discharge to :Home  Allergies as of 11/18/2019   No Known Allergies     Medication List    TAKE these medications   acetaminophen 325 MG  tablet Commonly known as: TYLENOL Take 650 mg by mouth every 6 (six) hours as needed.   amLODipine 2.5 MG tablet Commonly known as: NORVASC Take 2 tablets (5 mg total) by mouth daily.   aspirin 81 MG EC tablet Take 1 tablet (81 mg total) by mouth daily. Swallow whole.   busPIRone 7.5 MG tablet Commonly known as: BUSPAR Take 7.5 mg by mouth daily.   metoprolol tartrate 25 MG tablet Commonly known as: LOPRESSOR Take 1 tablet (25 mg total) by mouth 2 (two) times daily.   oxyCODONE 5 MG immediate release tablet Commonly known as: Oxy IR/ROXICODONE Take 1 tablet (5 mg total) by mouth every 6 (six) hours as needed for moderate pain or severe pain.   rosuvastatin 40 MG tablet Commonly known as: CRESTOR Take 1 tablet (40 mg total) by mouth daily.      Verbal and written Discharge instructions given to the patient. Wound care per Discharge AVS  Follow-up Information    Georgiana Spinner, NP Follow up in 2 week(s).   Specialty: Vascular Surgery Why: Can see Vivia Birmingham for first post-op visit. No studies.  Contact information: [address] [place] [address] [phone]              Signed: Tonette Lederer, PA-C  11/18/2019, 12:02 PM

## 2019-11-22 NOTE — Telephone Encounter (Signed)
Contacted facility where patient is and spoke to [place] at [facility]. She answered the following questions:  Patient contacted regarding discharge from [facility] on 11/18/19.  Patient understands to follow up with provider Dan Humphreys, NP on 11/28/19 at 11 am at [facility]. Patient understands discharge instructions? yes Patient understands medications and regiment? yes Patient understands to bring all medications to this visit? yes  Ask patient:  Are you enrolled in My Chart  - No thank you at this time.

## 2019-11-22 NOTE — Telephone Encounter (Signed)
  TCM....  Spoke with care facility   Scheduled tcm 10/8 at 11 with walker

## 2019-11-25 ENCOUNTER — Ambulatory Visit: Payer: Medicare Other | Admitting: Family

## 2019-11-28 ENCOUNTER — Other Ambulatory Visit: Payer: Self-pay

## 2019-11-28 ENCOUNTER — Ambulatory Visit (INDEPENDENT_AMBULATORY_CARE_PROVIDER_SITE_OTHER): Payer: Medicare Other | Admitting: Family

## 2019-11-28 ENCOUNTER — Encounter: Payer: Self-pay | Admitting: Family

## 2019-11-28 VITALS — BP 120/68 | HR 87 | Ht 65.5 in | Wt 152.4 lb

## 2019-11-28 DIAGNOSIS — I10 Essential (primary) hypertension: Secondary | ICD-10-CM

## 2019-11-28 DIAGNOSIS — I422 Other hypertrophic cardiomyopathy: Secondary | ICD-10-CM | POA: Diagnosis not present

## 2019-11-28 DIAGNOSIS — K5900 Constipation, unspecified: Secondary | ICD-10-CM

## 2019-11-28 DIAGNOSIS — Z950 Presence of cardiac pacemaker: Secondary | ICD-10-CM | POA: Diagnosis not present

## 2019-11-28 DIAGNOSIS — I6523 Occlusion and stenosis of bilateral carotid arteries: Secondary | ICD-10-CM

## 2019-11-28 DIAGNOSIS — I471 Supraventricular tachycardia: Secondary | ICD-10-CM

## 2019-11-28 MED ORDER — POLYETHYLENE GLYCOL 3350 17 G PO PACK: 17.0000 g | PACK | Freq: Every day | ORAL | 2 refills | Status: DC | PRN

## 2019-11-28 MED ORDER — METOPROLOL TARTRATE 25 MG PO TABS: 25.0000 mg | ORAL_TABLET | Freq: Two times a day (BID) | ORAL | 0 refills | Status: DC

## 2019-11-28 MED ORDER — AMLODIPINE BESYLATE 2.5 MG PO TABS: 5.0000 mg | ORAL_TABLET | Freq: Every day | ORAL | 0 refills | Status: DC

## 2019-11-28 NOTE — Patient Instructions (Signed)
Medication Instructions:  Your physician has recommended you make the following change in your medication:   START Miralax once daily as needed for constipation *If this does not resolve your constipation, please talk with your primary care provider.  *If you need a refill on your cardiac medications before your next appointment, please call your pharmacy*   Lab Work: None ordered today.   Testing/Procedures: None ordered today.   Follow-Up: At [facility], you and your health needs are our priority.  As part of our continuing mission to provide you with exceptional heart care, we have created designated Provider Care Teams.  These Care Teams include your primary Cardiologist (physician) and Advanced Practice Providers (APPs -  Physician Assistants and Nurse Practitioners) who all work together to provide you with the care you need, when you need it.  We recommend signing up for the patient portal called "MyChart".  Sign up information is provided on this After Visit Summary.  MyChart is used to connect with patients for Virtual Visits (Telemedicine).  Patients are able to view lab/test results, encounter notes, upcoming appointments, etc.  Non-urgent messages can be sent to your provider as well.   To learn more about what you can do with MyChart, go to [email or web address].    Your next appointment:   2 month(s)  The format for your next appointment:   In Person  Provider:   You may see Julien Nordmann, MD or one of the following Advanced Practice Providers on your designated Care Team:    Nicolasa Ducking, NP  Eula Listen, PA-C  Marisue Ivan, PA-C  Cadence [place], [place]  Gillian Shields, NP    Other Instructions  Constipation, Adult Constipation is when a person:  Poops (has a bowel movement) fewer times in a week than normal.  Has a hard time pooping.  Has poop that is dry, hard, or bigger than normal. Follow these instructions at home: Eating and  drinking   Eat foods that have a lot of fiber, such as: ? Fresh fruits and vegetables. ? Whole grains. ? Beans.  Eat less of foods that are high in fat, low in fiber, or overly processed, such as: ? [place] fries. ? Hamburgers. ? Cookies. ? Candy. ? Soda.  Drink enough fluid to keep your pee (urine) clear or pale yellow. General instructions  Exercise regularly or as told by your doctor.  Go to the restroom when you feel like you need to poop. Do not hold it in.  Take over-the-counter and prescription medicines only as told by your doctor. These include any fiber supplements.  Do pelvic floor retraining exercises, such as: ? Doing deep breathing while relaxing your lower belly (abdomen). ? Relaxing your pelvic floor while pooping.  Watch your condition for any changes.  Keep all follow-up visits as told by your doctor. This is important. Contact a doctor if:  You have pain that gets worse.  You have a fever.  You have not pooped for 4 days.  You throw up (vomit).  You are not hungry.  You lose weight.  You are bleeding from the anus.  You have thin, pencil-like poop (stool). Get help right away if:  You have a fever, and your symptoms suddenly get worse.  You leak poop or have blood in your poop.  Your belly feels hard or bigger than normal (is bloated).  You have very bad belly pain.  You feel dizzy or you faint. This information is not intended to replace advice given  to you by your health care provider. Make sure you discuss any questions you have with your health care provider. Document Revised: 01/16/2017 Document Reviewed: 07/25/2015 Elsevier Patient Education  2020 Elsevier Inc.  Hypertrophic Cardiomyopathy  Hypertrophic cardiomyopathy (HCM) is a heart condition in which part of the heart muscle gets too thick. What are the signs or symptoms? Symptoms of this condition include:  Shortness of breath, especially after exercising or lying  down.  Chest pain.  Dizziness.  Fatigue.  Irregular or fast heart rate.  Fainting, especially after physical activity. How is this diagnosed? This condition is diagnosed based on:  A physical exam that involves checking for an abnormal heart sound (heart murmur).  Tests, such as: ? An electrocardiogram (ECG). This is a test that records your heart's electrical activity. ? An echocardiogram. This test can show whether your left ventricle is enlarged and whether it fills slowly. ? An exercise stress test. This test is done to collect information about how your heart functions during exercise. ? A Doppler test. This is a test that shows irregular blood flow and pressure differences inside the heart. ? A chest X-ray to see whether your heart is enlarged. ? An MRI. ? Genetic testing done on a blood sample. How is this treated? Treatment for HCM depends on how severe your symptoms are. There are several options for treatment, including:  Medicine. Medicines can be given to: ? Reduce the workload of your heart. ? Lower your blood pressure. ? Thin your blood and prevent clots.  A device. Devices that can be used to treat this condition include: ? A pacemaker. This device helps to control your heartbeat. ? A defibrillator. This device restores a normal heart rhythm.  Surgery. This may include a procedure to: ? Inject alcohol into the small blood vessels that supply your heart muscle (alcohol septal ablation).This is done during a procedure called cardiac catheterization. The goal is to cause the muscle to become thinner. ? Remove part of the wall that divides the right and left sides of the heart (septum) with a procedure called surgical myectomy. ? Replace the mitral valve. Follow these instructions at home: Activity  Avoid strenuous exercise and activities, such as shoveling snow. Do not participate in high intensity activities, such as competitive sports.  Get regular physical  activity. Most patients can participate in low to moderate intensity exercise such as walking. Ask your health care provider what activity level is safe for you.  Do not lift anything that is heavier than 10 lb (4.5 kg), or the limit that you are told, until your health care provider says that it is safe. Lifestyle  Maintain a healthy weight. If you need help with losing weight, ask your health care provider.  Eat a heart-healthy diet.  Do not drink alcohol if: ? Your health care provider tells you not to drink. ? You are pregnant, may be pregnant, or are planning to become pregnant.  If you drink alcohol: ? Limit how much you use to:  0-1 drink a day for women.  0-2 drinks a day for men. ? Be aware of how much alcohol is in your drink. In the [place] one drink equals one 12 oz bottle of beer (355 mL), one 5 oz glass of wine (148 mL), or one 1 oz glass of hard liquor (44 mL).  Do not use any products that contain nicotine or tobacco, such as cigarettes, e-cigarettes, and chewing tobacco. If you need help quitting, ask your health  care provider. General instructions  Make sure the members of your household know how to do CPR in case of an emergency.  Take over-the-counter and prescription medicines only as told by your health care provider.  Keep all follow-up visits as told by your health care provider. This is important. Contact a health care provider if:  You have new symptoms.  Your symptoms get worse. Get help right away if:  You have chest pain or shortness of breath, especially during or after sports.  You feel faint or you pass out.  You have trouble breathing even at rest.  Your feet or ankles swell.  Your heartbeat seems irregular or seems faster than normal (palpitations). These symptoms may represent a serious problem that is an emergency. Do not wait to see if the symptoms will go away. Get medical help right away. Call your local emergency services (911 in the  [place] Do not drive yourself to the hospital. Summary  Hypertrophic cardiomyopathy (HCM) is a heart condition in which part of the heart muscle gets too thick.  The condition can cause a dangerous and abnormal heart rhythm, and it can weaken the heart over time.  Avoid strenuous exercise and activities, such as shoveling snow.  Make sure the members of your household know how to do CPR in case of an emergency.  Keep all follow-up visits as told by your health care provider. This is important. This information is not intended to replace advice given to you by your health care provider. Make sure you discuss any questions you have with your health care provider. Document Revised: 10/27/2017 Document Reviewed: 10/27/2017 Elsevier Patient Education  2020 [employer].

## 2019-11-28 NOTE — Progress Notes (Signed)
Office Visit    Patient Name: Shannon Chen Date of Encounter: 11/28/2019  Primary Care Provider:  Franciso Bend, NP Primary Cardiologist:  Julien Nordmann, MD Electrophysiologist:  Lewayne Bunting, MD   Chief Complaint    Shannon Chen is a 74 y.o. female with a hx of HCM, syncope with second-degree AV block s/p Medtronic PPM 09/2016, severe carotid artery stenosis s/p R carotid endarterectomy 11/16/19, prior CVA, tobacco use, HTN, HLD, paroxysmal SVT presents today for hospital follow up.   Past Medical History    Past Medical History:  Diagnosis Date  . Arthritis   . AV block, Mobitz II    a. 09/2016 s/p MDT [id number] Azure XT DR MRI DC PPM  . Carotid artery disease ([name])    a. 09/2016 Carotid U/S: RICA 70-99%, LICA 50-69%;  b. 09/2016 CTA Neck: RICA 60, RCCA 30, LICA 70%, L Vert 60, L Basilar 70.  . Essential hypertension   . Hyperlipidemia   . LVH (left ventricular hypertrophy)    a. 09/2016 Echo: EF 60-65%, no rwma, Gr1 DD, Ca2+ MV annulus; b. 11/2017 Echo: EF 70-75%, no rwma, Gr1 DD, mild AS vs dynamic LVOT obs. Nl RV fxn. Hyperdynamic LVEF w/ sev LVH.  . Presence of permanent cardiac pacemaker    Past Surgical History:  Procedure Laterality Date  . ABDOMINAL HYSTERECTOMY    . ENDARTERECTOMY Right 11/16/2019   Procedure: ENDARTERECTOMY CAROTID;  Surgeon: Renford Dills, MD;  Location: [facility] ORS;  Service: Vascular;  Laterality: Right;  . PACEMAKER IMPLANT N/A 10/08/2016   Procedure: Pacemaker Implant;  Surgeon: Marinus Maw, MD;  Location: [facility] INVASIVE CV LAB;  Service: Cardiovascular;  Laterality: N/A;    Allergies  No Known Allergies  History of Present Illness    Shannon Chen is a 74 y.o. female with a hx of HCM, syncope with second-degree AV block s/p Medtronic PPM 09/2016, severe carotid artery stenosis s/p R carotid endarterectomy 11/16/19, prior CVA, tobacco use, HTN, HLD, paroxysmal SVT last seen while hospitalized.  She had a stress test 10/26/19 which  was low risk.   Admitted 11/06/19 due to recurrent syncope likely due to intravascular volume depletion in setting of increased LV wall thickness and dynamic LVOT gradient on prior echo. Device interrogation with no sustained tachyarrhythmia not recommended for ICD at that time. She was started on Lopressor 25mg  BID and Amlodipine decreased. Recommended to avoid afterload reducing agents.   She underwent right carotid endarterectomy 11/16/19. She had small hematoma to incision line.   Present today for follow up with caretaker from her facility.  She denies lightheadedness dizziness.  She does endorse fatigue though notes this was present prior to her surgery.  Her chief complaint today is constipation.  Tells me she is having a bowel movement only every 3 to 4 days and is difficulty to pass her stool.  She has not been taking her oxycodone so low suspicion this is contributory.  She saw her primary care a few days ago but they did not discuss this issue.  EKGs/Labs/Other Studies Reviewed:   The following studies were reviewed today:  Echo 11/08/2019: 1. Left ventricular ejection fraction, by estimation, is 70 to 75%. The  left ventricle has hyperdynamic function. The left ventricle has no  regional wall motion abnormalities. There is severe left ventricular  hypertrophy. Left ventricular diastolic  parameters are consistent with Grade I diastolic dysfunction (impaired  relaxation). Elevated left atrial pressure.   2. Right ventricular systolic  function is mildly reduced. The right  ventricular size is normal. There is normal pulmonary artery systolic  pressure.   3. The mitral valve is normal in structure. Trivial mitral valve  regurgitation. No evidence of mitral stenosis.   4. The aortic valve has an indeterminant number of cusps. There is mild  calcification of the aortic valve. There is moderate thickening of the  aortic valve. Aortic valve regurgitation is not visualized. Mild to    moderate aortic valve  sclerosis/calcification is present, without any evidence of aortic  stenosis.   5. The inferior vena cava is normal in size with greater than 50%  respiratory variability, suggesting right atrial pressure of 3 mmHg.  __________   Eugenie Birks MPI 10/26/2019:  ST segment depression was noted during stress in the V3, V4, V5, V6, I, II, III and aVF leads.  T wave inversion was noted during stress. T wave inversion persisted.  The study is normal.  This is a low risk study.  The left ventricular ejection fraction is normal (61%).  There is no evidence for ischemia __________   2D echo 08/2019: EF 54%, normal wall motion, diastolic dysfunction, tricuspid regurgitation, normal RV systolic function and ventricular cavity size, normal PASP, normal size and structure aortic root __________   2D echo 04/2018: 1. The left ventricle has normal systolic function with an ejection  fraction of 60-65%. The cavity size was decreased. There is severely  increased left ventricular wall thickness. Near cavity obliteration in  systole. Left ventricular diastolic Doppler  parameters are consistent with impaired relaxation.   2. The right ventricle has normal systolic function. The cavity was  normal. There is no increase in right ventricular wall thickness. Unable  to estimate RVSP   3. Left atrial size was mildly dilated. __________   2D echo 11/2017: - Left ventricle: The cavity size was normal. Wall thickness was    increased in a pattern of severe LVH. The basal inferior wall is    relatively spared. Systolic function was hyperdynamic. The    estimated ejection fraction was in the range of 70% to 75%. Wall    motion was normal; there were no regional wall motion    abnormalities. Doppler parameters are consistent with abnormal    left ventricular relaxation (grade 1 diastolic dysfunction).  - Aortic valve: Transvalvular velocity was minimally increased.    There was very  mild stenosis versus dynamic LVOT obstruction.  - Mitral valve: Calcified annulus.  - Right ventricle: The cavity size was normal. Systolic function    was normal.   Impressions:   - Hyperdynamic LVEF with severe LVH. Hypertrophic cardiomyopathy    and dynamic LVOT obstruction (HOCM) cannot be excluded.    EKG:  EKG is ordered today.  The ekg ordered today demonstrates normal sinus rhythm 87 bpm with right bundle branch block and stable lateral T wave inversion consistent with hypertrophic cardiomyopathy.  Recent Labs: 11/08/2019: TSH 0.294 11/18/2019: BUN 12; Creatinine, Ser 0.74; Hemoglobin 8.8; Magnesium 2.0; Platelets 234; Potassium 3.5; Sodium 141  Recent Lipid Panel    Component Value Date/Time   CHOL 275 (H) 10/03/2016 1821   TRIG 178 (H) 10/03/2016 1821   HDL 54 10/03/2016 1821   CHOLHDL 5.1 10/03/2016 1821   VLDL 36 10/03/2016 1821   LDLCALC 185 (H) 10/03/2016 1821    Home Medications   No outpatient medications have been marked as taking for the 11/28/19 encounter (Appointment) with Alver Sorrow, NP.    Review of  Systems    All other systems reviewed and are otherwise negative except as noted above.  Physical Exam    VS:  There were no vitals taken for this visit. , BMI There is no height or weight on file to calculate BMI. GEN: Well nourished, well developed, in no acute distress. HEENT: normal. Neck: Supple, no JVD, carotid bruits, or masses. Cardiac: RRR, no murmurs, rubs, or gallops. No clubbing, cyanosis, edema.  Radials/DP/PT 2+ and equal bilaterally.  Respiratory:  Respirations regular and unlabored, clear to auscultation bilaterally. GI: Soft, nontender, nondistended, BS + x 4. MS: No deformity or atrophy. Skin: Warm and dry, no rash. Neuro:  Strength and sensation are intact. Psych: Normal affect.  Assessment & Plan    1. HCM - Avoid afterload reducing agents as well as diuretic.  No indication for ICD at this time per EP.  Continue Lopressor 25  mg twice daily.  2. Constipation -start MiraLAX once daily as needed for constipation.  Encouraged fruit, fluid, fiber.  Further follow-up with primary care as needed.  3. Syncope -most recent episode during hospitalization 10/2019 likely due to intravascular volume depletion in setting of increased LV wall thickness and dynamic LVOT gradient.  4. Bilateral carotid artery stenosis - S/p R carotid endarterectomy.  Continue to follow with vascular surgery.  5. Secondary AV block type II -s/p Medtronic PPM.  No indication for upgrade ICD at this time.  6. AKI - Careful titration of diuretics and antihypertensives.   7. Paroxysmal SVT - Denies palpitations.  Continue present beta-blocker.  No recurrent arrhythmia by EKG today.  8. HLD-  Continue Rosuvastatin 40mg  daily.   9. HTN-  BP well controlled. Continue current antihypertensive regimen.   Disposition: Follow up in 2 month(s) with Dr. or APP.   [name], NP 11/28/2019, 7:59 AM

## 2019-11-29 ENCOUNTER — Other Ambulatory Visit: Payer: Self-pay

## 2019-11-29 ENCOUNTER — Emergency Department
Admission: EM | Admit: 2019-11-29 | Discharge: 2019-11-29 | Disposition: A | Discharge status: Home / Self Care | Payer: Medicare Other | Attending: Emergency Medicine | Admitting: Emergency Medicine

## 2019-11-29 DIAGNOSIS — Z950 Presence of cardiac pacemaker: Secondary | ICD-10-CM | POA: Diagnosis not present

## 2019-11-29 DIAGNOSIS — Z79899 Other long term (current) drug therapy: Secondary | ICD-10-CM | POA: Diagnosis not present

## 2019-11-29 DIAGNOSIS — Z7982 Long term (current) use of aspirin: Secondary | ICD-10-CM | POA: Insufficient documentation

## 2019-11-29 DIAGNOSIS — I10 Essential (primary) hypertension: Secondary | ICD-10-CM | POA: Insufficient documentation

## 2019-11-29 DIAGNOSIS — I2510 Atherosclerotic heart disease of native coronary artery without angina pectoris: Secondary | ICD-10-CM | POA: Insufficient documentation

## 2019-11-29 DIAGNOSIS — Z4800 Encounter for change or removal of nonsurgical wound dressing: Secondary | ICD-10-CM | POA: Diagnosis not present

## 2019-11-29 DIAGNOSIS — Z5189 Encounter for other specified aftercare: Secondary | ICD-10-CM

## 2019-11-29 DIAGNOSIS — Z87891 Personal history of nicotine dependence: Secondary | ICD-10-CM | POA: Diagnosis not present

## 2019-11-29 DIAGNOSIS — L7622 Postprocedural hemorrhage and hematoma of skin and subcutaneous tissue following other procedure: Secondary | ICD-10-CM | POA: Diagnosis present

## 2019-11-29 LAB — CBC
HCT: 34.8 % — ABNORMAL LOW (ref 36.0–46.0)
Hemoglobin: 11.4 g/dL — ABNORMAL LOW (ref 12.0–15.0)
MCH: 29.2 pg (ref 26.0–34.0)
MCHC: 32.8 g/dL (ref 30.0–36.0)
MCV: 89.2 fL (ref 80.0–100.0)
Platelets: 302 10*3/uL (ref 150–400)
RBC: 3.9 MIL/uL (ref 3.87–5.11)
RDW: 13.6 % (ref 11.5–15.5)
WBC: 7.5 10*3/uL (ref 4.0–10.5)
nRBC: 0 % (ref 0.0–0.2)

## 2019-11-29 LAB — BASIC METABOLIC PANEL
Anion gap: 10 (ref 5–15)
BUN: 20 mg/dL (ref 8–23)
CO2: 28 mmol/L (ref 22–32)
Calcium: 9.1 mg/dL (ref 8.9–10.3)
Chloride: 102 mmol/L (ref 98–111)
Creatinine, Ser: 1.21 mg/dL — ABNORMAL HIGH (ref 0.44–1.00)
GFR, Estimated: 43 mL/min — ABNORMAL LOW (ref >60)
Glucose, Bld: 134 mg/dL — ABNORMAL HIGH (ref 70–99)
Potassium: 3.6 mmol/L (ref 3.5–5.1)
Sodium: 140 mmol/L (ref 135–145)
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
## 2019-11-29 NOTE — ED Notes (Signed)
CONTACT INFORMATION: Group Home number [facility]) : [phone] Group Home OWNER Charlton Amor [facility]) : [phone]

## 2019-11-29 NOTE — ED Triage Notes (Signed)
PT to ED via POV with caregiver from care place with c/o suture rupture. PT was not doing anything, just sitting when her surgical site at her R carotid artery began bleeding. At this time bleeding is controlled, hard to tell if sutures are intact, site is 53 week old. PT denies pain . PT takes an aspirin a day.

## 2019-11-29 NOTE — Discharge Instructions (Addendum)
Please seek medical attention for any high fevers, chest pain, shortness of breath, change in behavior, persistent vomiting, bloody stool or any other new or concerning symptoms.  

## 2019-12-02 ENCOUNTER — Ambulatory Visit (INDEPENDENT_AMBULATORY_CARE_PROVIDER_SITE_OTHER): Payer: Medicare Other | Admitting: Nurse Practitioner

## 2019-12-06 ENCOUNTER — Other Ambulatory Visit: Payer: Self-pay

## 2019-12-06 ENCOUNTER — Encounter (INDEPENDENT_AMBULATORY_CARE_PROVIDER_SITE_OTHER): Payer: Self-pay | Admitting: Nurse Practitioner

## 2019-12-06 ENCOUNTER — Ambulatory Visit (INDEPENDENT_AMBULATORY_CARE_PROVIDER_SITE_OTHER): Payer: Medicare Other | Admitting: Nurse Practitioner

## 2019-12-06 VITALS — BP 131/74 | HR 67 | Resp 16 | Wt 151.6 lb

## 2019-12-06 DIAGNOSIS — I6523 Occlusion and stenosis of bilateral carotid arteries: Secondary | ICD-10-CM

## 2019-12-06 DIAGNOSIS — I10 Essential (primary) hypertension: Secondary | ICD-10-CM

## 2019-12-06 DIAGNOSIS — E785 Hyperlipidemia, unspecified: Secondary | ICD-10-CM

## 2019-12-06 NOTE — Progress Notes (Signed)
Subjective:    Patient ID: Shannon Chen, female    DOB: 1945-05-10, 74 y.o.   MRN: [id number] Chief Complaint  Patient presents with  . Follow-up    [facility] 2wk carotid endarterectomy    Patient presents today after having a right carotid endarterectomy about 2 weeks ago.  Overall the patient is doing well however shortly after discharge she had an incident with continued bleeding.  The patient went to the emergency room and the bleeding was contained.  The patient has not had any further incidences of bleeding since leaving the emergency room.  Today the wound is clean dry and intact.  Dermabond still in place with dried blood evident.  Currently patient is doing well.   Review of Systems  Skin: Positive for wound.  All other systems reviewed and are negative.      Objective:   Physical Exam Vitals reviewed.  HENT:     Head: Normocephalic.  Cardiovascular:     Rate and Rhythm: Normal rate.     Pulses: Normal pulses.  Pulmonary:     Effort: Pulmonary effort is normal.  Musculoskeletal:     Cervical back: Normal range of motion. Tenderness present.  Skin:    Comments: Incision on right neck  Neurological:     General: No focal deficit present.     Mental Status: She is alert and oriented to person, place, and time.  Psychiatric:        Mood and Affect: Mood normal.        Behavior: Behavior normal.        Thought Content: Thought content normal.        Judgment: Judgment normal.     BP 131/74 (BP Location: Right Arm)   Pulse 67   Resp 16   Wt 151 lb 9.6 oz (68.8 kg)   BMI 25.23 kg/m   Past Medical History:  Diagnosis Date  . Arthritis   . AV block, Mobitz II    a. 09/2016 s/p MDT [id number] Azure XT DR MRI DC PPM  . Carotid artery disease ([name])    a. 09/2016 Carotid U/S: RICA 70-99%, LICA 50-69%;  b. 09/2016 CTA Neck: RICA 60, RCCA 30, LICA 70%, L Vert 60, L Basilar 70.  . Essential hypertension   . Hyperlipidemia   . LVH (left ventricular hypertrophy)    a.  09/2016 Echo: EF 60-65%, no rwma, Gr1 DD, Ca2+ MV annulus; b. 11/2017 Echo: EF 70-75%, no rwma, Gr1 DD, mild AS vs dynamic LVOT obs. Nl RV fxn. Hyperdynamic LVEF w/ sev LVH.  . Presence of permanent cardiac pacemaker     Social History   Socioeconomic History  . Marital status: Single    Spouse name: Not on file  . Number of children: Not on file  . Years of education: Not on file  . Highest education level: Not on file  Occupational History  . Not on file  Tobacco Use  . Smoking status: Former Smoker    Packs/day: 1.00    Years: 30.00    Pack years: 30.00    Types: Cigarettes    Quit date: 11/09/2018    Years since quitting: 1.0  . Smokeless tobacco: Never Used  Vaping Use  . Vaping Use: Never used  Substance and Sexual Activity  . Alcohol use: No  . Drug use: No  . Sexual activity: Not on file  Other Topics Concern  . Not on file  Social History Narrative   Independent at  baseline   Ambulates without any assistance. Lives by herself   Social Determinants of Health   Financial Resource Strain:   . Difficulty of Paying Living Expenses: Not on file  Food Insecurity:   . Worried About Programme researcher, broadcasting/film/video in the Last Year: Not on file  . Ran Out of Food in the Last Year: Not on file  Transportation Needs:   . Lack of Transportation (Medical): Not on file  . Lack of Transportation (Non-Medical): Not on file  Physical Activity:   . Days of Exercise per Week: Not on file  . Minutes of Exercise per Session: Not on file  Stress:   . Feeling of Stress : Not on file  Social Connections:   . Frequency of Communication with Friends and Family: Not on file  . Frequency of Social Gatherings with Friends and Family: Not on file  . Attends Religious Services: Not on file  . Active Member of Clubs or Organizations: Not on file  . Attends Banker Meetings: Not on file  . Marital Status: Not on file  Intimate Partner Violence:   . Fear of Current or Ex-Partner: Not  on file  . Emotionally Abused: Not on file  . Physically Abused: Not on file  . Sexually Abused: Not on file    Past Surgical History:  Procedure Laterality Date  . ABDOMINAL HYSTERECTOMY    . ENDARTERECTOMY Right 11/16/2019   Procedure: ENDARTERECTOMY CAROTID;  Surgeon: Renford Dills, MD;  Location: [facility] ORS;  Service: Vascular;  Laterality: Right;  . PACEMAKER IMPLANT N/A 10/08/2016   Procedure: Pacemaker Implant;  Surgeon: Marinus Maw, MD;  Location: [facility] INVASIVE CV LAB;  Service: Cardiovascular;  Laterality: N/A;    Family History  Problem Relation Age of Onset  . Diabetes Mother   . CAD Father     No Known Allergies     Assessment & Plan:   1. Carotid stenosis, bilateral The patient is doing well post right carotid endarterectomy.  Patient will continue with daily aspirin.  Patient will follow up in 6 to 8 weeks for carotid duplex.  2. Essential hypertension Continue antihypertensive medications as already ordered, these medications have been reviewed and there are no changes at this time.   3. Dyslipidemia Continue statin as ordered and reviewed, no changes at this time    Current Outpatient Medications on File Prior to Visit  Medication Sig Dispense Refill  . acetaminophen (TYLENOL) 325 MG tablet Take 650 mg by mouth every 6 (six) hours as needed.    Marland Kitchen amLODipine (NORVASC) 2.5 MG tablet Take 2 tablets (5 mg total) by mouth daily. 60 tablet 0  . aspirin EC 81 MG EC tablet Take 1 tablet (81 mg total) by mouth daily. Swallow whole. 30 tablet 11  . busPIRone (BUSPAR) 7.5 MG tablet Take 7.5 mg by mouth daily.     . metoprolol tartrate (LOPRESSOR) 25 MG tablet Take 1 tablet (25 mg total) by mouth 2 (two) times daily. 60 tablet 0  . oxyCODONE (OXY IR/ROXICODONE) 5 MG immediate release tablet Take 1 tablet (5 mg total) by mouth every 6 (six) hours as needed for moderate pain or severe pain. 15 tablet 0  . polyethylene glycol (MIRALAX) 17 g packet Take 17 g by mouth  daily as needed for mild constipation. 14 each 2  . rosuvastatin (CRESTOR) 40 MG tablet Take 1 tablet (40 mg total) by mouth daily. 90 tablet 3   No current facility-administered medications on  file prior to visit.    There are no Patient Instructions on file for this visit. No follow-ups on file.   Kris Hartmann, NP

## 2019-12-07 ENCOUNTER — Other Ambulatory Visit: Payer: Self-pay | Admitting: Family

## 2019-12-11 ENCOUNTER — Encounter (INDEPENDENT_AMBULATORY_CARE_PROVIDER_SITE_OTHER): Payer: Self-pay | Admitting: Nurse Practitioner

## 2019-12-20 ENCOUNTER — Encounter: Payer: Self-pay | Admitting: Internal Medicine

## 2019-12-20 ENCOUNTER — Ambulatory Visit (INDEPENDENT_AMBULATORY_CARE_PROVIDER_SITE_OTHER): Payer: Medicare Other | Admitting: Internal Medicine

## 2019-12-20 ENCOUNTER — Other Ambulatory Visit: Payer: Self-pay

## 2019-12-20 VITALS — BP 128/70 | HR 77 | Ht 65.0 in | Wt 151.0 lb

## 2019-12-20 DIAGNOSIS — Z950 Presence of cardiac pacemaker: Secondary | ICD-10-CM | POA: Diagnosis not present

## 2019-12-20 DIAGNOSIS — I422 Other hypertrophic cardiomyopathy: Secondary | ICD-10-CM | POA: Diagnosis not present

## 2019-12-20 DIAGNOSIS — I441 Atrioventricular block, second degree: Secondary | ICD-10-CM | POA: Diagnosis not present

## 2019-12-20 DIAGNOSIS — I10 Essential (primary) hypertension: Secondary | ICD-10-CM

## 2019-12-20 NOTE — Patient Instructions (Signed)
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
## 2020-01-30 ENCOUNTER — Other Ambulatory Visit (INDEPENDENT_AMBULATORY_CARE_PROVIDER_SITE_OTHER): Payer: Self-pay | Admitting: Vascular Surgery

## 2020-01-30 DIAGNOSIS — I6521 Occlusion and stenosis of right carotid artery: Secondary | ICD-10-CM

## 2020-01-30 DIAGNOSIS — Z9889 Other specified postprocedural states: Secondary | ICD-10-CM

## 2020-01-30 NOTE — Progress Notes (Deleted)
NO SHOW

## 2020-01-31 ENCOUNTER — Ambulatory Visit: Payer: Medicare Other | Admitting: Cardiovascular Disease

## 2020-02-01 ENCOUNTER — Encounter (INDEPENDENT_AMBULATORY_CARE_PROVIDER_SITE_OTHER): Payer: Medicare Other

## 2020-02-01 ENCOUNTER — Ambulatory Visit (INDEPENDENT_AMBULATORY_CARE_PROVIDER_SITE_OTHER): Payer: Medicare Other | Admitting: Nurse Practitioner

## 2020-02-01 ENCOUNTER — Encounter: Payer: Self-pay | Admitting: Cardiovascular Disease

## 2020-02-29 ENCOUNTER — Ambulatory Visit (INDEPENDENT_AMBULATORY_CARE_PROVIDER_SITE_OTHER): Payer: Medicare HMO | Admitting: Nurse Practitioner

## 2020-02-29 ENCOUNTER — Encounter (INDEPENDENT_AMBULATORY_CARE_PROVIDER_SITE_OTHER): Payer: Self-pay | Admitting: Nurse Practitioner

## 2020-02-29 ENCOUNTER — Ambulatory Visit (INDEPENDENT_AMBULATORY_CARE_PROVIDER_SITE_OTHER): Payer: Medicare HMO

## 2020-02-29 ENCOUNTER — Other Ambulatory Visit: Payer: Self-pay

## 2020-02-29 VITALS — BP 137/82 | HR 70 | Ht 65.0 in | Wt 151.0 lb

## 2020-02-29 DIAGNOSIS — I6523 Occlusion and stenosis of bilateral carotid arteries: Secondary | ICD-10-CM | POA: Diagnosis not present

## 2020-02-29 DIAGNOSIS — I10 Essential (primary) hypertension: Secondary | ICD-10-CM | POA: Diagnosis not present

## 2020-02-29 DIAGNOSIS — E785 Hyperlipidemia, unspecified: Secondary | ICD-10-CM

## 2020-02-29 DIAGNOSIS — Z9889 Other specified postprocedural states: Secondary | ICD-10-CM

## 2020-02-29 DIAGNOSIS — I6521 Occlusion and stenosis of right carotid artery: Secondary | ICD-10-CM

## 2020-02-29 NOTE — Progress Notes (Signed)
Subjective:    Patient ID: Shannon Chen, female    DOB: 11-30-1945, 75 y.o.   MRN: [id number] Chief Complaint  Patient presents with  . Carotid    U/S    The patient is seen for follow up evaluation of carotid stenosis status post right carotid endarterectomy on 11/16/2019.  There were no post operative problems or complications related to the surgery.  The patient denies neck or incisional pain.  The patient denies interval amaurosis fugax. There is no recent history of TIA symptoms or focal motor deficits. There is no prior documented CVA.  The patient denies headache.  The patient is taking enteric-coated aspirin 81 mg daily.  The patient has a history of coronary artery disease, no recent episodes of angina or shortness of breath. The patient denies PAD or claudication symptoms. There is a history of hyperlipidemia which is being treated with a statin.   Today noninvasive studies show 1 to 39% stenosis bilaterally   Review of Systems  Neurological: Positive for weakness.  All other systems reviewed and are negative.      Objective:   Physical Exam Vitals reviewed.  HENT:     Head: Normocephalic.  Cardiovascular:     Rate and Rhythm: Normal rate.     Pulses: Normal pulses.  Pulmonary:     Effort: Pulmonary effort is normal.  Neurological:     Mental Status: She is alert and oriented to person, place, and time.     Motor: Weakness present.  Psychiatric:        Mood and Affect: Mood normal.        Behavior: Behavior normal.        Thought Content: Thought content normal.        Judgment: Judgment normal.     BP 137/82   Pulse 70   Ht 5\' 5"  (1.651 m)   Wt 151 lb (68.5 kg)   BMI 25.13 kg/m   Past Medical History:  Diagnosis Date  . Arthritis   . AV block, Mobitz II    a. 09/2016 s/p MDT 10/2016 Azure XT DR MRI DC PPM  . Carotid artery disease ([name])    a. 09/2016 Carotid U/S: RICA 70-99%, LICA 50-69%;  b. 09/2016 CTA Neck: RICA 60, RCCA 30, LICA 70%, L  Vert 60, L Basilar 70.  . Essential hypertension   . Hyperlipidemia   . LVH (left ventricular hypertrophy)    a. 09/2016 Echo: EF 60-65%, no rwma, Gr1 DD, Ca2+ MV annulus; b. 11/2017 Echo: EF 70-75%, no rwma, Gr1 DD, mild AS vs dynamic LVOT obs. Nl RV fxn. Hyperdynamic LVEF w/ sev LVH.  . Presence of permanent cardiac pacemaker     Social History   Socioeconomic History  . Marital status: Single    Spouse name: Not on file  . Number of children: Not on file  . Years of education: Not on file  . Highest education level: Not on file  Occupational History  . Not on file  Tobacco Use  . Smoking status: Former Smoker    Packs/day: 1.00    Years: 30.00    Pack years: 30.00    Types: Cigarettes    Quit date: 11/09/2018    Years since quitting: 1.3  . Smokeless tobacco: Never Used  Vaping Use  . Vaping Use: Never used  Substance and Sexual Activity  . Alcohol use: No  . Drug use: No  . Sexual activity: Not on file  Other Topics Concern  .  Not on file  Social History Narrative   Independent at baseline   Ambulates without any assistance. Lives by herself   Social Determinants of Health   Financial Resource Strain: Not on file  Food Insecurity: Not on file  Transportation Needs: Not on file  Physical Activity: Not on file  Stress: Not on file  Social Connections: Not on file  Intimate Partner Violence: Not on file    Past Surgical History:  Procedure Laterality Date  . ABDOMINAL HYSTERECTOMY    . ENDARTERECTOMY Right 11/16/2019   Procedure: ENDARTERECTOMY CAROTID;  Surgeon: Renford Dills, MD;  Location: [facility] ORS;  Service: Vascular;  Laterality: Right;  . PACEMAKER IMPLANT N/A 10/08/2016   Procedure: Pacemaker Implant;  Surgeon: Marinus Maw, MD;  Location: [facility] INVASIVE CV LAB;  Service: Cardiovascular;  Laterality: N/A;    Family History  Problem Relation Age of Onset  . Diabetes Mother   . CAD Father     No Known Allergies  CBC Latest Ref Rng & Units  11/29/2019 11/18/2019 11/17/2019  WBC 4.0 - 10.5 K/uL 7.5 6.6 10.5  Hemoglobin 12.0 - 15.0 g/dL 11.4(L) 8.8(L) 9.8(L)  Hematocrit 36.0 - 46.0 % 34.8(L) 26.2(L) 27.7(L)  Platelets 150 - 400 K/uL 302 234 258      CMP     Component Value Date/Time   NA 140 11/29/2019 1546   K 3.6 11/29/2019 1546   CL 102 11/29/2019 1546   CO2 28 11/29/2019 1546   GLUCOSE 134 (H) 11/29/2019 1546   BUN 20 11/29/2019 1546   CREATININE 1.21 (H) 11/29/2019 1546   CALCIUM 9.1 11/29/2019 1546   PROT 7.0 11/17/2017 1445   ALBUMIN 3.7 11/17/2017 1445   AST 15 11/17/2017 1445   ALT 11 11/17/2017 1445   ALKPHOS 60 11/17/2017 1445   BILITOT 1.2 11/17/2017 1445   GFRNONAA 43 (L) 11/29/2019 1546   GFRAA >60 11/18/2019 0619     No results found.     Assessment & Plan:   1. Carotid stenosis, bilateral Recommend:  The patient is s/p successful right CEA  Duplex ultrasound preoperatively shows 1-39% contralateral stenosis.  Continue antiplatelet therapy as prescribed Continue management of CAD, HTN and Hyperlipidemia Healthy heart diet,  encouraged exercise at least 4 times per week  Follow up in 6 months with duplex ultrasound and physical exam based on the patient's carotid surgery    2. Dyslipidemia Continue statin as ordered and reviewed, no changes at this time   3. Primary hypertension Continue antihypertensive medications as already ordered, these medications have been reviewed and there are no changes at this time.    Current Outpatient Medications on File Prior to Visit  Medication Sig Dispense Refill  . acetaminophen (TYLENOL) 325 MG tablet Take 650 mg by mouth every 6 (six) hours as needed.    Marland Kitchen amLODipine (NORVASC) 2.5 MG tablet Take 2 tablets (5 mg total) by mouth daily. 60 tablet 0  . amLODipine (NORVASC) 5 MG tablet Take 5 mg by mouth daily.    Marland Kitchen aspirin EC 81 MG EC tablet Take 1 tablet (81 mg total) by mouth daily. Swallow whole. 30 tablet 11  . busPIRone (BUSPAR) 7.5 MG  tablet Take 7.5 mg by mouth daily.     . ergocalciferol (VITAMIN D2) 1.25 MG (50000 UT) capsule     . furosemide (LASIX) 20 MG tablet     . lisinopril (ZESTRIL) 10 MG tablet     . loperamide (IMODIUM) 2 MG capsule Take by mouth.    Marland Kitchen  metoprolol tartrate (LOPRESSOR) 25 MG tablet TAKE 1 TABLET BY MOUTH 2 TIMES DAILY 60 tablet 2  . oxyCODONE (OXY IR/ROXICODONE) 5 MG immediate release tablet Take 1 tablet (5 mg total) by mouth every 6 (six) hours as needed for moderate pain or severe pain. 15 tablet 0  . polyethylene glycol (MIRALAX) 17 g packet Take 17 g by mouth daily as needed for mild constipation. 14 each 2  . rosuvastatin (CRESTOR) 40 MG tablet Take 1 tablet (40 mg total) by mouth daily. 90 tablet 3   No current facility-administered medications on file prior to visit.    There are no Patient Instructions on file for this visit. No follow-ups on file.   Georgiana Spinner, NP

## 2020-03-02 ENCOUNTER — Other Ambulatory Visit: Payer: Self-pay | Admitting: Family

## 2020-03-05 ENCOUNTER — Ambulatory Visit: Payer: Medicare HMO | Admitting: Physician Assistant

## 2020-03-09 ENCOUNTER — Ambulatory Visit: Payer: Medicare HMO | Admitting: Nurse Practitioner

## 2020-03-09 ENCOUNTER — Encounter: Payer: Self-pay | Admitting: Nurse Practitioner
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
## 2020-03-12 ENCOUNTER — Encounter: Payer: Self-pay | Admitting: Nurse Practitioner

## 2020-03-14 ENCOUNTER — Emergency Department: Payer: Medicare HMO

## 2020-03-14 ENCOUNTER — Encounter: Payer: Self-pay | Admitting: Emergency Medicine

## 2020-03-14 ENCOUNTER — Other Ambulatory Visit: Payer: Self-pay

## 2020-03-14 ENCOUNTER — Emergency Department
Admission: EM | Admit: 2020-03-14 | Discharge: 2020-03-14 | Disposition: A | Discharge status: Home / Self Care | Payer: Medicare HMO | Attending: Emergency Medicine | Admitting: Emergency Medicine

## 2020-03-14 DIAGNOSIS — R109 Unspecified abdominal pain: Secondary | ICD-10-CM | POA: Diagnosis present

## 2020-03-14 DIAGNOSIS — I10 Essential (primary) hypertension: Secondary | ICD-10-CM | POA: Insufficient documentation

## 2020-03-14 DIAGNOSIS — Z87891 Personal history of nicotine dependence: Secondary | ICD-10-CM | POA: Diagnosis not present

## 2020-03-14 DIAGNOSIS — Z950 Presence of cardiac pacemaker: Secondary | ICD-10-CM | POA: Insufficient documentation

## 2020-03-14 DIAGNOSIS — R1010 Upper abdominal pain, unspecified: Secondary | ICD-10-CM

## 2020-03-14 DIAGNOSIS — Z7982 Long term (current) use of aspirin: Secondary | ICD-10-CM | POA: Diagnosis not present

## 2020-03-14 DIAGNOSIS — K8020 Calculus of gallbladder without cholecystitis without obstruction: Secondary | ICD-10-CM

## 2020-03-14 DIAGNOSIS — R911 Solitary pulmonary nodule: Secondary | ICD-10-CM

## 2020-03-14 DIAGNOSIS — Z79899 Other long term (current) drug therapy: Secondary | ICD-10-CM | POA: Diagnosis not present

## 2020-03-14 LAB — URINALYSIS, COMPLETE (UACMP) WITH MICROSCOPIC
Bacteria, UA: NONE SEEN
Bilirubin Urine: NEGATIVE
Glucose, UA: NEGATIVE mg/dL
Ketones, ur: NEGATIVE mg/dL
Leukocytes,Ua: NEGATIVE
Nitrite: NEGATIVE
Protein, ur: 100 mg/dL — AB
Specific Gravity, Urine: 1.024 (ref 1.005–1.030)
pH: 6 (ref 5.0–8.0)

## 2020-03-14 LAB — CBC
HCT: 41.2 % (ref 36.0–46.0)
Hemoglobin: 12.7 g/dL (ref 12.0–15.0)
MCH: 27.4 pg (ref 26.0–34.0)
MCHC: 30.8 g/dL (ref 30.0–36.0)
MCV: 88.8 fL (ref 80.0–100.0)
Platelets: 329 10*3/uL (ref 150–400)
RBC: 4.64 MIL/uL (ref 3.87–5.11)
RDW: 13.4 % (ref 11.5–15.5)
WBC: 8.2 10*3/uL (ref 4.0–10.5)
nRBC: 0 % (ref 0.0–0.2)

## 2020-03-14 LAB — COMPREHENSIVE METABOLIC PANEL
ALT: 39 U/L (ref 0–44)
AST: 39 U/L (ref 15–41)
Albumin: 3.1 g/dL — ABNORMAL LOW (ref 3.5–5.0)
Alkaline Phosphatase: 45 U/L (ref 38–126)
Anion gap: 13 (ref 5–15)
BUN: 21 mg/dL (ref 8–23)
CO2: 30 mmol/L (ref 22–32)
Calcium: 9 mg/dL (ref 8.9–10.3)
Chloride: 99 mmol/L (ref 98–111)
Creatinine, Ser: 0.9 mg/dL (ref 0.44–1.00)
GFR, Estimated: >60 mL/min (ref >60)
Glucose, Bld: 119 mg/dL — ABNORMAL HIGH (ref 70–99)
Potassium: 2.5 mmol/L — CL (ref 3.5–5.1)
Sodium: 142 mmol/L (ref 135–145)
Total Bilirubin: 1 mg/dL (ref 0.3–1.2)
Total Protein: 7.5 g/dL (ref 6.5–8.1)

## 2020-03-14 LAB — LIPASE, BLOOD: Lipase: 41 U/L (ref 11–51)

## 2020-03-14 MED ORDER — POTASSIUM CHLORIDE 20 MEQ PO PACK
40.0000 meq | PACK | Freq: Every day | ORAL | Status: DC
  Administered 2020-03-14: 40 meq via ORAL
  Filled 2020-03-14: qty 2

## 2020-03-14 MED ORDER — IOHEXOL 300 MG/ML  SOLN
100.0000 mL | Freq: Once | INTRAMUSCULAR | Status: AC | PRN
  Administered 2020-03-14: 100 mL via INTRAVENOUS

## 2020-03-14 MED ORDER — POTASSIUM CHLORIDE 20 MEQ PO PACK
20.0000 meq | PACK | Freq: Once | ORAL | Status: AC
  Administered 2020-03-14: 20 meq via ORAL

## 2020-03-14 NOTE — ED Notes (Signed)
Date and time results received: 03/14/20 1215 (use smartphrase ".now" to insert current time)  Test: K Critical Value: 2.5  Name of Provider Notified: Bradler  Orders Received? Or Actions Taken?: Orders Received - See Orders for details
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
## 2020-03-14 NOTE — Discharge Instructions (Addendum)
? ?  Please return to the emergency room right away if you are to develop a fever, severe nausea, your pain becomes severe or worsens, you are unable to keep food down, begin vomiting any dark or bloody fluid, you develop any dark or bloody stools, feel dehydrated, or other new concerns or symptoms arise. ? ?

## 2020-03-14 NOTE — ED Triage Notes (Signed)
Pt comes into the ED via POV c/io LUQ abdominal pain that started yesterday. DEnies any N/V/D.  Pt denies any CP, SHOB, or dizziness.  Pt in NAD at this time with even and unlabored respirations.  Last BM 2 days ago.
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
## 2020-03-22 ENCOUNTER — Encounter: Payer: Self-pay | Admitting: Physician Assistant

## 2020-03-22 ENCOUNTER — Telehealth: Payer: Self-pay | Admitting: Physician Assistant

## 2020-03-22 ENCOUNTER — Other Ambulatory Visit: Payer: Self-pay

## 2020-03-22 ENCOUNTER — Ambulatory Visit (INDEPENDENT_AMBULATORY_CARE_PROVIDER_SITE_OTHER): Payer: Medicare HMO | Admitting: Physician Assistant

## 2020-03-22 VITALS — BP 98/62 | HR 60 | Ht 65.5 in | Wt 152.0 lb

## 2020-03-22 DIAGNOSIS — Z950 Presence of cardiac pacemaker: Secondary | ICD-10-CM | POA: Diagnosis not present

## 2020-03-22 DIAGNOSIS — I4729 Other ventricular tachycardia: Secondary | ICD-10-CM

## 2020-03-22 DIAGNOSIS — Z79899 Other long term (current) drug therapy: Secondary | ICD-10-CM

## 2020-03-22 DIAGNOSIS — R55 Syncope and collapse: Secondary | ICD-10-CM

## 2020-03-22 DIAGNOSIS — I517 Cardiomegaly: Secondary | ICD-10-CM

## 2020-03-22 DIAGNOSIS — Z8673 Personal history of transient ischemic attack (TIA), and cerebral infarction without residual deficits: Secondary | ICD-10-CM

## 2020-03-22 DIAGNOSIS — I472 Ventricular tachycardia: Secondary | ICD-10-CM

## 2020-03-22 DIAGNOSIS — I6523 Occlusion and stenosis of bilateral carotid arteries: Secondary | ICD-10-CM

## 2020-03-22 DIAGNOSIS — I441 Atrioventricular block, second degree: Secondary | ICD-10-CM | POA: Diagnosis not present

## 2020-03-22 DIAGNOSIS — I10 Essential (primary) hypertension: Secondary | ICD-10-CM

## 2020-03-22 NOTE — Telephone Encounter (Signed)
Patient family member calling with BP update  124/77 Please call to discuss

## 2020-03-22 NOTE — Telephone Encounter (Signed)
BP was noted to be improved at 124/77 upon returning back to her facility.  She should continue current medications as listed from her facility.  Hold antihypertensives for systolic blood pressure less than 110 mmHg.  Follow-up next week to reassess symptoms.

## 2020-03-22 NOTE — Patient Instructions (Addendum)
Medication Instructions:   Call with Medications to review by phone. Please do this today and ask for [facility] all blood pressure medications if systolic (top number) is less than 120.  *If you need a refill on your cardiac medications before your next appointment, please call your pharmacy*   Lab Work: None ordered today.  If you have labs (blood work) drawn today and your tests are completely normal, you will receive your results only by: Marland Kitchen MyChart Message (if you have MyChart) OR . A paper copy in the mail If you have any lab test that is abnormal or we need to change your treatment, we will call you to review the results.   Testing/Procedures: None ordered today.   Follow-Up: At [facility], you and your health needs are our priority.  As part of our continuing mission to provide you with exceptional heart care, we have created designated Provider Care Teams.  These Care Teams include your primary Cardiologist (physician) and Advanced Practice Providers (APPs -  Physician Assistants and Nurse Practitioners) who all work together to provide you with the care you need, when you need it.  We recommend signing up for the patient portal called "MyChart".  Sign up information is provided on this After Visit Summary.  MyChart is used to connect with patients for Virtual Visits (Telemedicine).  Patients are able to view lab/test results, encounter notes, upcoming appointments, etc.  Non-urgent messages can be sent to your provider as well.   To learn more about what you can do with MyChart, go to [email or web address].    Your next appointment:   1 week(s)  The format for your next appointment:   In Person  Provider:   You may see Julien Nordmann, MD or one of the following Advanced Practice Providers on your designated Care Team:    Nicolasa Ducking, NP  Eula Listen, PA-C  Marisue Ivan, PA-C  Cadence English, [place]  Gillian Shields, NP

## 2020-03-22 NOTE — Telephone Encounter (Signed)
Called number with no answer and left voicemail message to call back at facility number.

## 2020-03-22 NOTE — Telephone Encounter (Signed)
Spoke with Shannon Chen at [facility] and requested to review her medication list. She reviewed all medications and inquired all of the ones we have listed. She reports blood pressure once she returned to facility was 124/77.   Rosuvastatin 40 mg daily Buspar 7.5 daily Asa 81 daily Amlodipine 5 mg daily Metoprolol 25 twice a daily Miralax as needed Oxycodone as needed Imodium as needed Tylenol as needed  She is not taking Lisinopril, Furosemide, Amlodipine 2.5 dose, Vitamin D2, no Lisinopril.

## 2020-03-23 NOTE — Telephone Encounter (Signed)
Left voicemail message to call back  

## 2020-03-23 NOTE — Telephone Encounter (Signed)
Called and spoke with Shannon Chen who requested that I call back around 4 pm to speak with the person caring for this patient.
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
## 2020-03-26 NOTE — Telephone Encounter (Signed)
Spoke with Clydie Braun at facility and reviewed new recommendations to hold blood pressure medications for top number less than 110. She read back information and confirmed to hold for less than 110. She verbalized understanding with no further questions at this time.

## 2020-03-27 ENCOUNTER — Encounter: Payer: Self-pay | Admitting: Cardiovascular Disease

## 2020-03-27 ENCOUNTER — Ambulatory Visit (INDEPENDENT_AMBULATORY_CARE_PROVIDER_SITE_OTHER): Payer: Medicare HMO | Admitting: Cardiovascular Disease

## 2020-03-27 ENCOUNTER — Other Ambulatory Visit: Payer: Self-pay

## 2020-03-27 VITALS — BP 90/50 | HR 66 | Ht 65.5 in | Wt 148.0 lb

## 2020-03-27 DIAGNOSIS — Z950 Presence of cardiac pacemaker: Secondary | ICD-10-CM

## 2020-03-27 DIAGNOSIS — I6523 Occlusion and stenosis of bilateral carotid arteries: Secondary | ICD-10-CM

## 2020-03-27 DIAGNOSIS — R55 Syncope and collapse: Secondary | ICD-10-CM

## 2020-03-27 DIAGNOSIS — I441 Atrioventricular block, second degree: Secondary | ICD-10-CM

## 2020-03-27 DIAGNOSIS — I4729 Other ventricular tachycardia: Secondary | ICD-10-CM

## 2020-03-27 DIAGNOSIS — Z8673 Personal history of transient ischemic attack (TIA), and cerebral infarction without residual deficits: Secondary | ICD-10-CM

## 2020-03-27 DIAGNOSIS — I739 Peripheral vascular disease, unspecified: Secondary | ICD-10-CM

## 2020-03-27 DIAGNOSIS — I422 Other hypertrophic cardiomyopathy: Secondary | ICD-10-CM

## 2020-03-27 DIAGNOSIS — I472 Ventricular tachycardia: Secondary | ICD-10-CM

## 2020-03-27 MED ORDER — METOPROLOL TARTRATE 25 MG PO TABS: 12.5000 mg | ORAL_TABLET | Freq: Two times a day (BID) | ORAL | 0 refills | Status: DC

## 2020-03-27 NOTE — Patient Instructions (Addendum)
Medication Instructions:  hold amlodipine  hold lisinopril Hold the lasix  Decrease metoprolol down to 12.5 mg twice a day  If you need a refill on your cardiac medications before your next appointment, please call your pharmacy.    Lab work: No new labs needed   If you have labs (blood work) drawn today and your tests are completely normal, you will receive your results only by: Marland Kitchen MyChart Message (if you have MyChart) OR . A paper copy in the mail If you have any lab test that is abnormal or we need to change your treatment, we will call you to review the results.   Testing/Procedures: No new testing needed   Follow-Up: At [facility], you and your health needs are our priority.  As part of our continuing mission to provide you with exceptional heart care, we have created designated Provider Care Teams.  These Care Teams include your primary Cardiologist (physician) and Advanced Practice Providers (APPs -  Physician Assistants and Nurse Practitioners) who all work together to provide you with the care you need, when you need it.  . You will need a follow up appointment in 2 weeks   . Providers on your designated Care Team:   . Nicolasa Ducking, NP . Eula Listen, PA-C . Marisue Ivan, PA-C  Any Other Special Instructions Will Be Listed Below (If Applicable).  COVID-19 Vaccine Information can be found at: [email or web address] For questions related to vaccine distribution or appointments, please email [email or web address] or call [phone].
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
## 2020-04-11 ENCOUNTER — Other Ambulatory Visit: Payer: Self-pay

## 2020-04-11 ENCOUNTER — Ambulatory Visit (INDEPENDENT_AMBULATORY_CARE_PROVIDER_SITE_OTHER): Payer: Medicare HMO | Admitting: Cardiovascular Disease

## 2020-04-11 ENCOUNTER — Encounter: Payer: Self-pay | Admitting: Cardiovascular Disease

## 2020-04-11 ENCOUNTER — Telehealth: Payer: Self-pay

## 2020-04-11 VITALS — BP 90/58 | HR 70 | Ht 65.5 in | Wt 150.0 lb

## 2020-04-11 DIAGNOSIS — I472 Ventricular tachycardia: Secondary | ICD-10-CM

## 2020-04-11 DIAGNOSIS — I4729 Other ventricular tachycardia: Secondary | ICD-10-CM

## 2020-04-11 DIAGNOSIS — I422 Other hypertrophic cardiomyopathy: Secondary | ICD-10-CM | POA: Diagnosis not present

## 2020-04-11 DIAGNOSIS — I739 Peripheral vascular disease, unspecified: Secondary | ICD-10-CM

## 2020-04-11 DIAGNOSIS — I6523 Occlusion and stenosis of bilateral carotid arteries: Secondary | ICD-10-CM

## 2020-04-11 DIAGNOSIS — I441 Atrioventricular block, second degree: Secondary | ICD-10-CM

## 2020-04-11 DIAGNOSIS — R0989 Other specified symptoms and signs involving the circulatory and respiratory systems: Secondary | ICD-10-CM

## 2020-04-11 DIAGNOSIS — Z950 Presence of cardiac pacemaker: Secondary | ICD-10-CM

## 2020-04-11 DIAGNOSIS — I959 Hypotension, unspecified: Secondary | ICD-10-CM

## 2020-04-11 DIAGNOSIS — R55 Syncope and collapse: Secondary | ICD-10-CM

## 2020-04-11 MED ORDER — AMLODIPINE BESYLATE 5 MG PO TABS: 5.0000 mg | ORAL_TABLET | Freq: Every day | ORAL | 3 refills | Status: DC | PRN

## 2020-04-11 NOTE — Telephone Encounter (Signed)
Called to Shannon Chen's facility, spoke with Shannon Chen, advised that after Dr. [name] reviewed her chart, noted:  "We ordered carotid ultrasound on this patient, she just had one in January. She had a decreased pulsations left upper extremity. Technician did not notice significant subclavian artery disease, may have been missed, unclear. We can cancel the carotid as one was just done. Blockage in the arm may be further down, we will just medically manage for now"  Appt on 2/28 for carotid US was cancel, went over d/c instructions from today's office visit of medications and BP management. Shannon Chen verbalized understanding. Will see pt at next visit to re-evaluate pt's pressures.

## 2020-04-11 NOTE — Progress Notes (Signed)
Carotid US cancel per Dr. [name]. Pt called to be notified.

## 2020-04-11 NOTE — Patient Instructions (Addendum)
PLEASE ONLY TAKE PULSE & BP IN THE RIGHT ARM  Medication Instructions:  STOP the metoprolol  Amlodipine 5 mg daily PRN only for SBP >150  Lab work: No new labs needed  Testing/Procedures: Carotid ultrasound (weak pulse on the left arm, decreased Blood pressure left arm)  Will call with results AND RECOMMENDATION BASED ON RESULTS   Follow-Up:  . You will need a follow up appointment in 6 months  . Providers on your designated Care Team:   . Nicolasa Ducking, NP . Eula Listen, PA-C . Marisue Ivan, PA-C    COVID-19 Vaccine Information can be found at: [email or web address].nl For questions related to vaccine distribution or appointments, please email [email or web address] or call [phone].

## 2020-04-16 ENCOUNTER — Other Ambulatory Visit: Payer: Self-pay

## 2020-04-16 ENCOUNTER — Ambulatory Visit: Payer: Medicare HMO | Attending: Critical Care Medicine

## 2020-04-16 DIAGNOSIS — Z23 Encounter for immunization: Secondary | ICD-10-CM

## 2020-04-16 NOTE — Progress Notes (Signed)
   Covid-19 Vaccination Clinic  Name:  Shannon Chen    MRN: [id number] DOB: Oct 13, 1945  04/16/2020  [name] was observed post Covid-19 immunization for 15 minutes without incident. She was provided with Vaccine Information Sheet and instruction to access the V-Safe system.   [name] was instructed to call 911 with any severe reactions post vaccine: Marland Kitchen Difficulty breathing  . Swelling of face and throat  . A fast heartbeat  . A bad rash all over body  . Dizziness and weakness   Immunizations Administered    Name Date Dose VIS Date Route   PFIZER Comrnaty(Gray TOP) Covid-19 Vaccine 04/16/2020  3:30 PM 0.3 mL 01/26/2020 Intramuscular   Manufacturer: [employer], [employer]   Lot: [id number]   NDC: [phone]

## 2020-04-27 ENCOUNTER — Telehealth: Payer: Self-pay | Admitting: Cardiovascular Disease

## 2020-04-27 NOTE — Telephone Encounter (Signed)
Patient's facility send blood pressure readings, placed in Nurse box.

## 2020-04-27 NOTE — Telephone Encounter (Signed)
[facility] [place] faxed over a note stating   "Clients blood pressure has been elevated since you d/c her medications. Some of her meds, leaving others as PRN, her ranges has been"... 163/84 163/86 213/111  205/113  163/86  121/64    AVS from 2/23 advised : PLEASE ONLY TAKE PULSE & BP IN THE RIGHT ARM  STOP  metoprolol  Amlodipine 5 mg daily PRN only for SBP >150  Will route to Dr. [name] for review.

## 2020-05-02 ENCOUNTER — Telehealth: Payer: Self-pay

## 2020-05-02 NOTE — Telephone Encounter (Signed)
We have four blood pressure measurements dating back to end of January 2022, 3 in February confirmed in medical facility no systolic pressure was over 100 Appears to be some disconnect Would have him continue to monitor blood pressure, give amlodipine 5 for systolic pressure over 150 Would make sure when they are checking pressure she has been sitting resting for 5 minutes,  not running around Would make sure that not checking it when she is supine They are welcome to send [place] more numbers as they get them

## 2020-05-02 NOTE — Telephone Encounter (Signed)
Called and spoke with "fill in" personal for [employer] [facility], she confirmed fax number was correct, reports hear the fax machine "rign twice but nothing happen". Reports and other personal who is helping out are not familiar with fax machines, was able to walking them through operation of machine, was able to get fax machine working. Reports multiple faxes coming through at this time.   Resent fax at this time, got confirmation that fax was successful. It will be given to Mrs. Pepperman's caretaker at [employer] [facility] so they can accomodate BP orders. Advise to call back with any concerns or questions.

## 2020-05-02 NOTE — Telephone Encounter (Signed)
Fax sent back to Clydie Braun at [facility] with dr. Ethelene Hal orders/recommedations "We have four blood pressure measurements dating back to end of January 2022, 3 in February confirmed in medical facility no systolic pressure was over 100 Appears to be some disconnect Would have him continue to monitor blood pressure, give amlodipine 5 for systolic pressure over 150 Would make sure when they are checking pressure she has been sitting resting for 5 minutes,  not running around Would make sure that not checking it when she is supine They are welcome to send [place] more numbers as they get them" Will await future fax with updated BP readings of pt

## 2020-05-02 NOTE — Telephone Encounter (Signed)
2 attempts to fax orders failed, attempted to call with no answer at the facility, no able to leave message

## 2020-05-18 ENCOUNTER — Other Ambulatory Visit: Payer: Self-pay

## 2020-05-18 ENCOUNTER — Observation Stay
Admission: EM | Admit: 2020-05-18 | Discharge: 2020-05-21 | Disposition: A | Discharge status: Home / Self Care | Payer: Medicare HMO | Attending: Hospitalist | Admitting: Hospitalist

## 2020-05-18 ENCOUNTER — Emergency Department: Payer: Medicare HMO

## 2020-05-18 DIAGNOSIS — Z950 Presence of cardiac pacemaker: Secondary | ICD-10-CM | POA: Diagnosis not present

## 2020-05-18 DIAGNOSIS — Z7982 Long term (current) use of aspirin: Secondary | ICD-10-CM | POA: Insufficient documentation

## 2020-05-18 DIAGNOSIS — Z79899 Other long term (current) drug therapy: Secondary | ICD-10-CM | POA: Diagnosis not present

## 2020-05-18 DIAGNOSIS — I110 Hypertensive heart disease with heart failure: Secondary | ICD-10-CM | POA: Insufficient documentation

## 2020-05-18 DIAGNOSIS — I509 Heart failure, unspecified: Secondary | ICD-10-CM | POA: Diagnosis not present

## 2020-05-18 DIAGNOSIS — I2510 Atherosclerotic heart disease of native coronary artery without angina pectoris: Secondary | ICD-10-CM | POA: Diagnosis not present

## 2020-05-18 DIAGNOSIS — Z20822 Contact with and (suspected) exposure to covid-19: Secondary | ICD-10-CM | POA: Insufficient documentation

## 2020-05-18 DIAGNOSIS — I119 Hypertensive heart disease without heart failure: Secondary | ICD-10-CM

## 2020-05-18 DIAGNOSIS — R531 Weakness: Principal | ICD-10-CM | POA: Insufficient documentation

## 2020-05-18 DIAGNOSIS — Z87891 Personal history of nicotine dependence: Secondary | ICD-10-CM | POA: Diagnosis not present

## 2020-05-18 DIAGNOSIS — R778 Other specified abnormalities of plasma proteins: Secondary | ICD-10-CM | POA: Diagnosis not present

## 2020-05-18 DIAGNOSIS — F0390 Unspecified dementia without behavioral disturbance: Secondary | ICD-10-CM | POA: Diagnosis not present

## 2020-05-18 DIAGNOSIS — I739 Peripheral vascular disease, unspecified: Secondary | ICD-10-CM

## 2020-05-18 DIAGNOSIS — I422 Other hypertrophic cardiomyopathy: Secondary | ICD-10-CM | POA: Diagnosis not present

## 2020-05-18 DIAGNOSIS — I10 Essential (primary) hypertension: Secondary | ICD-10-CM | POA: Diagnosis present

## 2020-05-18 LAB — TROPONIN I (HIGH SENSITIVITY)
Troponin I (High Sensitivity): 133 ng/L (ref <18)
Troponin I (High Sensitivity): 145 ng/L (ref <18)
Troponin I (High Sensitivity): 187 ng/L (ref <18)
Troponin I (High Sensitivity): 197 ng/L (ref <18)
Troponin I (High Sensitivity): 204 ng/L (ref <18)

## 2020-05-18 LAB — CBC WITH DIFFERENTIAL/PLATELET
Abs Immature Granulocytes: 0.01 10*3/uL (ref 0.00–0.07)
Basophils Absolute: 0 10*3/uL (ref 0.0–0.1)
Basophils Relative: 0 %
Eosinophils Absolute: 0.2 10*3/uL (ref 0.0–0.5)
Eosinophils Relative: 3 %
HCT: 38.6 % (ref 36.0–46.0)
Hemoglobin: 12 g/dL (ref 12.0–15.0)
Immature Granulocytes: 0 %
Lymphocytes Relative: 41 %
Lymphs Abs: 1.9 10*3/uL (ref 0.7–4.0)
MCH: 26.7 pg (ref 26.0–34.0)
MCHC: 31.1 g/dL (ref 30.0–36.0)
MCV: 86 fL (ref 80.0–100.0)
Monocytes Absolute: 0.2 10*3/uL (ref 0.1–1.0)
Monocytes Relative: 3 %
Neutro Abs: 2.3 10*3/uL (ref 1.7–7.7)
Neutrophils Relative %: 53 %
Platelets: 144 10*3/uL — ABNORMAL LOW (ref 150–400)
RBC: 4.49 MIL/uL (ref 3.87–5.11)
RDW: 14.5 % (ref 11.5–15.5)
WBC: 4.5 10*3/uL (ref 4.0–10.5)
nRBC: 0 % (ref 0.0–0.2)

## 2020-05-18 LAB — URINALYSIS, COMPLETE (UACMP) WITH MICROSCOPIC
Bacteria, UA: NONE SEEN
Bilirubin Urine: NEGATIVE
Glucose, UA: 150 mg/dL — AB
Hgb urine dipstick: NEGATIVE
Ketones, ur: NEGATIVE mg/dL
Leukocytes,Ua: NEGATIVE
Nitrite: NEGATIVE
Protein, ur: 100 mg/dL — AB
Specific Gravity, Urine: 1.015 (ref 1.005–1.030)
Squamous Epithelial / HPF: NONE SEEN (ref 0–5)
pH: 6 (ref 5.0–8.0)

## 2020-05-18 LAB — LACTIC ACID, PLASMA
Lactic Acid, Venous: 1.3 mmol/L (ref 0.5–1.9)
Lactic Acid, Venous: 1.1 mmol/L (ref 0.5–1.9)

## 2020-05-18 LAB — COMPREHENSIVE METABOLIC PANEL
ALT: 13 U/L (ref 0–44)
AST: 24 U/L (ref 15–41)
Albumin: 3.7 g/dL (ref 3.5–5.0)
Alkaline Phosphatase: 63 U/L (ref 38–126)
Anion gap: 10 (ref 5–15)
BUN: 23 mg/dL (ref 8–23)
CO2: 22 mmol/L (ref 22–32)
Calcium: 9.3 mg/dL (ref 8.9–10.3)
Chloride: 108 mmol/L (ref 98–111)
Creatinine, Ser: 1.06 mg/dL — ABNORMAL HIGH (ref 0.44–1.00)
GFR, Estimated: 54 mL/min — ABNORMAL LOW (ref >60)
Glucose, Bld: 195 mg/dL — ABNORMAL HIGH (ref 70–99)
Potassium: 3.9 mmol/L (ref 3.5–5.1)
Sodium: 140 mmol/L (ref 135–145)
Total Bilirubin: 1.6 mg/dL — ABNORMAL HIGH (ref 0.3–1.2)
Total Protein: 7.5 g/dL (ref 6.5–8.1)

## 2020-05-18 LAB — RESP PANEL BY RT-PCR (FLU A&B, COVID) ARPGX2
Influenza A by PCR: NEGATIVE
Influenza B by PCR: NEGATIVE
SARS Coronavirus 2 by RT PCR: NEGATIVE

## 2020-05-18 LAB — CK: Total CK: 65 U/L (ref 38–234)

## 2020-05-18 MED ORDER — AMLODIPINE BESYLATE 5 MG PO TABS
5.0000 mg | ORAL_TABLET | Freq: Every day | ORAL | Status: DC
  Administered 2020-05-18 – 2020-05-21 (×4): 5 mg via ORAL
  Filled 2020-05-18 (×4): qty 1

## 2020-05-18 MED ORDER — SODIUM CHLORIDE 0.9 % IV BOLUS
500.0000 mL | Freq: Once | INTRAVENOUS | Status: AC
  Administered 2020-05-18: 500 mL via INTRAVENOUS

## 2020-05-18 MED ORDER — POLYETHYLENE GLYCOL 3350 17 G PO PACK: 17.0000 g | PACK | Freq: Every day | ORAL | Status: DC | PRN

## 2020-05-18 MED ORDER — SODIUM CHLORIDE 0.9 % IV SOLN: 250.0000 mL | INTRAVENOUS | Status: DC | PRN

## 2020-05-18 MED ORDER — TRAMADOL HCL 50 MG PO TABS
50.0000 mg | ORAL_TABLET | Freq: Four times a day (QID) | ORAL | Status: DC | PRN
  Administered 2020-05-18: 50 mg via ORAL
  Filled 2020-05-18: qty 1

## 2020-05-18 MED ORDER — SODIUM CHLORIDE 0.9% FLUSH: 3.0000 mL | INTRAVENOUS | Status: DC | PRN

## 2020-05-18 MED ORDER — SODIUM CHLORIDE 0.9% FLUSH
3.0000 mL | Freq: Two times a day (BID) | INTRAVENOUS | Status: DC
  Administered 2020-05-18 – 2020-05-21 (×6): 3 mL via INTRAVENOUS

## 2020-05-18 MED ORDER — BUSPIRONE HCL 15 MG PO TABS
7.5000 mg | ORAL_TABLET | Freq: Every day | ORAL | Status: DC
Start: 2020-05-18 — End: 2020-05-21
  Administered 2020-05-18 – 2020-05-21 (×4): 7.5 mg via ORAL
  Filled 2020-05-18 (×4): qty 1

## 2020-05-18 MED ORDER — ASPIRIN EC 81 MG PO TBEC
81.0000 mg | DELAYED_RELEASE_TABLET | Freq: Every day | ORAL | Status: DC
  Administered 2020-05-18 – 2020-05-21 (×4): 81 mg via ORAL
  Filled 2020-05-18 (×4): qty 1

## 2020-05-18 MED ORDER — ENOXAPARIN SODIUM 40 MG/0.4ML ~~LOC~~ SOLN
40.0000 mg | SUBCUTANEOUS | Status: DC
  Administered 2020-05-18 – 2020-05-20 (×3): 40 mg via SUBCUTANEOUS
  Filled 2020-05-18 (×3): qty 0.4

## 2020-05-18 MED ORDER — ASPIRIN 81 MG PO CHEW
324.0000 mg | CHEWABLE_TABLET | Freq: Once | ORAL | Status: AC
  Administered 2020-05-18: 324 mg via ORAL
  Filled 2020-05-18: qty 4

## 2020-05-18 MED ORDER — ONDANSETRON HCL 4 MG PO TABS: 4.0000 mg | ORAL_TABLET | Freq: Four times a day (QID) | ORAL | Status: DC | PRN

## 2020-05-18 MED ORDER — NITROFURANTOIN MACROCRYSTAL 50 MG PO CAPS: 100.0000 mg | ORAL_CAPSULE | Freq: Two times a day (BID) | ORAL | Status: DC

## 2020-05-18 MED ORDER — ONDANSETRON HCL 4 MG/2ML IJ SOLN: 4.0000 mg | Freq: Four times a day (QID) | INTRAMUSCULAR | Status: DC | PRN

## 2020-05-18 MED ORDER — ACETAMINOPHEN 325 MG PO TABS: 650.0000 mg | ORAL_TABLET | ORAL | Status: DC | PRN

## 2020-05-18 MED ORDER — ROSUVASTATIN CALCIUM 20 MG PO TABS
40.0000 mg | ORAL_TABLET | Freq: Every day | ORAL | Status: DC
  Administered 2020-05-18 – 2020-05-21 (×4): 40 mg via ORAL
  Filled 2020-05-18 (×3): qty 4
  Filled 2020-05-18 (×2): qty 2

## 2020-05-18 NOTE — ED Notes (Signed)
IV attempt x1 by this RN. Pt tolerated well.

## 2020-05-18 NOTE — H&P (Signed)
History and Physical    Shannon Chen QQP:[id number] DOB: 03-Nov-1945 DOA: 05/18/2020  PCP: Franciso Bend, NP   Patient coming from: Skilled nursing facility  I have personally briefly reviewed patient's old medical records in [facility]  Chief Complaint: Weakness  HPI: Shannon Chen is a 75 y.o. 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 Per patient she states that the nursing home staff thought that she "fell out" which she denies. She denies having any chest pain, no shortness of breath, no nausea, no vomiting, no diaphoresis, no palpitations, no dizziness, no lightheadedness, no abdominal pain, no changes in her bowel habits, no fever, no chills, no cough, no urinary frequency, no nocturia, no dysuria. Labs show sodium 140, potassium 3.9, chloride 108, bicarb 22,, BUN 23, creatinine 1.06, calcium 9.3, alkaline phosphatase 63, albumin 3.7, AST 24, ALT 13, total protein 7.5, total CK 65, troponin 133 >> 145 >> 187, white count 5.5, hemoglobin 12.0, hematocrit 38.6, MCV 86, RDW 14.5, platelet count 144 Respiratory viral panel is negative Chest x-ray reviewed by me shows clear lungs Twelve-lead EKG reviewed by me shows sinus rhythm, PACs, right bundle branch block and ST depressions in the lateral leads.   ED Course: Patient is a 75 year old African-American female who presents to the emergency room via EMS for evaluation of weakness and poor oral intake.  She denies having any other constitutional symptoms.  Patient noted to have a bump in her troponin which shows an upward trend.  She will be referred to observation status for further evaluation.  Review of Systems: As per HPI otherwise all  other systems reviewed and negative.    Past Medical History:  Diagnosis Date  . Arthritis   . AV block, Mobitz II    a. 09/2016 s/p MDT [id number] Azure XT DR MRI DC PPM  . Carotid artery disease ([name])    a. 09/2016 Carotid U/S: RICA 70-99%, LICA 50-69%;  b. 09/2016 CTA Neck: RICA 60, RCCA 30, LICA 70%, L Vert 60, L Basilar 70; c. 10/2019 s/p R CEA.  . Essential hypertension   . History of stress test    a. 12/2016 MV: EF 51%, no ischemia/infarct; b. 10/2019 MV: EF 61%, no ischemia/infarct.  . Hyperlipidemia   . Hypertrophic cardiomyopathy ([name])    a. 09/2016 Echo: EF 60-65%, no rwma, Gr1 DD, Ca2+ MV annulus; b. 11/2017 Echo: EF 70-75%, no rwma, Gr1 DD, mild AS vs dynamic LVOT obs. Nl RV fxn. Hyperdynamic LVEF w/ sev LVH; c. 10/2019 Echo: EF 70-75%, no rwma, gr1 DD, sev LVH, Gr1 DD, nl PASP, triv MR, mild-mod Ao sclerosis  . Presence of permanent cardiac pacemaker     Past Surgical History:  Procedure Laterality Date  . ABDOMINAL HYSTERECTOMY    . ENDARTERECTOMY Right 11/16/2019   Procedure: ENDARTERECTOMY CAROTID;  Surgeon: Renford Dills, MD;  Location: [facility] ORS;  Service: Vascular;  Laterality: Right;  . PACEMAKER IMPLANT N/A 10/08/2016   Procedure: Pacemaker Implant;  Surgeon: Marinus Maw, MD;  Location: [facility] INVASIVE CV LAB;  Service: Cardiovascular;  Laterality: N/A;     reports that she quit smoking about 18 months ago. Her smoking use included cigarettes. She has a 30.00 pack-year smoking history. She has  never used smokeless tobacco. She reports that she does not drink alcohol and does not use drugs.  No Known Allergies  Family History  Problem Relation Age of Onset  . Diabetes Mother   . CAD Father       Prior to Admission medications   Medication Sig Start Date End Date Taking? Authorizing Provider  acetaminophen (TYLENOL) 325 MG tablet Take 650 mg by mouth every 4 (four) hours as needed.   Yes [provider]  amLODipine (NORVASC) 5 MG tablet Take 1 tablet (5  mg total) by mouth daily as needed. Take for BP >150 systolic Patient taking differently: Take 5 mg by mouth daily. Use only if BP >150 systolic 04/11/20 9/47/09 Yes Gollan, Tollie Pizza, MD  aspirin EC 81 MG EC tablet Take 1 tablet (81 mg total) by mouth daily. Swallow whole. 11/09/19  Yes Wouk, Wilfred Curtis, MD  busPIRone (BUSPAR) 7.5 MG tablet Take 7.5 mg by mouth daily.  09/07/19  Yes [provider]  loperamide (IMODIUM) 2 MG capsule Take 2 mg by mouth as needed. 12/07/19  Yes [provider]  nitrofurantoin (MACRODANTIN) 100 MG capsule Take 100 mg by mouth in the morning, at noon, in the evening, and at bedtime. 05/07/20  Yes [provider]  polyethylene glycol (MIRALAX) 17 g packet Take 17 g by mouth daily as needed for mild constipation. 11/28/19  Yes Alver Sorrow, NP  rosuvastatin (CRESTOR) 40 MG tablet Take 1 tablet (40 mg total) by mouth daily. 10/19/19  Yes Gollan, Tollie Pizza, MD  traMADol (ULTRAM) 50 MG tablet Take 50 mg by mouth every 6 (six) hours as needed. 04/23/20  Yes [provider]    Physical Exam: Vitals:   05/18/20 1230 05/18/20 1330 05/18/20 1430 05/18/20 1630  BP: 117/84 135/68 (!) 147/53 (!) 163/70  Pulse: 74 80 77 80  Resp: 17 11 12 16   Temp:      TempSrc:      SpO2: 99% 99% 99% 97%  Weight:      Height:         Vitals:   05/18/20 1230 05/18/20 1330 05/18/20 1430 05/18/20 1630  BP: 117/84 135/68 (!) 147/53 (!) 163/70  Pulse: 74 80 77 80  Resp: 17 11 12 16   Temp:      TempSrc:      SpO2: 99% 99% 99% 97%  Weight:      Height:          Constitutional: Alert and oriented x 3 . Not in any apparent distress.  Patient is slow to respond but is appropriate HEENT:      Head: Normocephalic and atraumatic.         Eyes: PERLA, EOMI, Conjunctivae are normal. Sclera is non-icteric.       Mouth/Throat: Mucous membranes are moist.       Neck: Supple with no signs of meningismus. Cardiovascular: Regular rate and rhythm. No  murmurs, gallops, or rubs. 2+ symmetrical distal pulses are present . No JVD. No LE edema Respiratory: Respiratory effort normal .Lungs sounds clear bilaterally. No wheezes, crackles, or rhonchi.  Gastrointestinal: Soft, non tender, and non distended with positive bowel sounds.  Genitourinary: No CVA tenderness. Musculoskeletal: Nontender with normal range of motion in all extremities. No cyanosis, or erythema of extremities. Neurologic:  Face is symmetric. Moving all extremities. No gross focal neurologic deficits  Skin: Skin is warm, dry.  No rash or ulcers Psychiatric: Mood and affect are normal   Labs on  Admission: I have personally reviewed following labs and imaging studies  CBC: Recent Labs  Lab 05/18/20 0816  WBC 4.5  NEUTROABS 2.3  HGB 12.0  HCT 38.6  MCV 86.0  PLT 144*   Basic Metabolic Panel: Recent Labs  Lab 05/18/20 0816  NA 140  K 3.9  CL 108  CO2 22  GLUCOSE 195*  BUN 23  CREATININE 1.06*  CALCIUM 9.3   GFR: Estimated Creatinine Clearance: 44.1 mL/min (A) (by C-G formula based on SCr of 1.06 mg/dL (H)). Liver Function Tests: Recent Labs  Lab 05/18/20 0816  AST 24  ALT 13  ALKPHOS 63  BILITOT 1.6*  PROT 7.5  ALBUMIN 3.7   No results for input(s): LIPASE, AMYLASE in the last 168 hours. No results for input(s): AMMONIA in the last 168 hours. Coagulation Profile: No results for input(s): INR, PROTIME in the last 168 hours. Cardiac Enzymes: Recent Labs  Lab 05/18/20 1600  CKTOTAL 65   BNP (last 3 results) No results for input(s): PROBNP in the last 8760 hours. HbA1C: No results for input(s): HGBA1C in the last 72 hours. CBG: No results for input(s): GLUCAP in the last 168 hours. Lipid Profile: No results for input(s): CHOL, HDL, LDLCALC, TRIG, CHOLHDL, LDLDIRECT in the last 72 hours. Thyroid Function Tests: No results for input(s): TSH, T4TOTAL, FREET4, T3FREE, THYROIDAB in the last 72 hours. Anemia Panel: No results for input(s):  VITAMINB12, FOLATE, FERRITIN, TIBC, IRON, RETICCTPCT in the last 72 hours. Urine analysis:    Component Value Date/Time   COLORURINE YELLOW (A) 05/18/2020 1000   APPEARANCEUR HAZY (A) 05/18/2020 1000   LABSPEC 1.015 05/18/2020 1000   PHURINE 6.0 05/18/2020 1000   GLUCOSEU 150 (A) 05/18/2020 1000   HGBUR NEGATIVE 05/18/2020 1000   BILIRUBINUR NEGATIVE 05/18/2020 1000   KETONESUR NEGATIVE 05/18/2020 1000   PROTEINUR 100 (A) 05/18/2020 1000   NITRITE NEGATIVE 05/18/2020 1000   LEUKOCYTESUR NEGATIVE 05/18/2020 1000    Radiological Exams on Admission: DG Chest Portable 1 View  Result Date: 05/18/2020 CLINICAL DATA:  Weakness and decreased appetite EXAM: PORTABLE CHEST 1 VIEW COMPARISON:  November 16, 2019 FINDINGS: Lungs are clear. Heart is upper normal in size with pulmonary vascularity are normal. Pacemaker leads are attached to the right heart. There is aortic atherosclerosis. No adenopathy. No bone lesions. IMPRESSION: Lungs clear. Heart upper normal in size. Pacemaker lead positions unchanged. Aortic Atherosclerosis (ICD10-I70.0). Electronically Signed   By: Bretta Bang III M.D.   On: 05/18/2020 08:25     Assessment/Plan Principal Problem:   Weakness Active Problems:   Hypertension   LVH (left ventricular hypertrophy) due to hypertensive disease, without heart failure   S/P placement of cardiac pacemaker   Elevated troponin   Hypertrophic cardiomyopathy ([name])    Weakness Unclear etiology Will request PT evaluation   Hypertension Continue amlodipine but only for systolic blood pressure greater than   Elevated troponin levels Unclear etiology We will continue to cycle cardiac enzymes Obtain 2D echocardiogram to rule out regional wall motion abnormality Continue aspirin and statins   Depression Continue BuSpar  DVT prophylaxis: Lovenox Code Status: full code Family Communication: Greater than 50% of time was spent discussing plan of care with patient  at the bedside.  All questions and concerns have been addressed.  She verbalizes understanding and agrees with the plan. Disposition Plan: Back to previous home environment Consults called: Cardiology Status: Observation    Johan Antonacci MD [facility]     05/18/2020, 6:00 PM

## 2020-05-18 NOTE — ED Notes (Signed)
Patient is resting comfortably. Call light within reach. Fall precautions in place.  

## 2020-05-18 NOTE — Consult Note (Signed)
Cardiology Consultation:   Patient ID: Shannon Chen Farias MRN: [id number]; DOB: 1945/09/29  Admit date: 05/18/2020 Date of Consult: 05/18/2020  PCP:  Franciso Bendogers, Jennifer B, NP   [facility]  Cardiologist:  Julien Nordmannimothy Anai Lipson, MD  Electrophysiologist:  Sherryl MangesSteven Klein, MD  Physician requesting consult: Dr. Joylene IgoAgbata Reason for consult: Weakness, elevated troponin   Patient Profile:   Shannon Chen Soltis is a 75 y.o. female with a hx of labile hypertension, severe LVH with normal ejection fraction, carotid stenosis, endarterectomy on right, syncope, pacemaker or secondary heart block August 2018, remote smoking history, who presents with weakness, noted to have elevated troponin  History of Present Illness:   Ms. Harrell GaveBethel reports that it was 7 AM, she was at [facility], reports that she felt tired, was leaning over the table, denies loss of consciousness She heard her friends and workers calling her name asking her if she was okay.  Reports that she just felt " weak". Denies any dizziness or near syncope Reports that she did not sleep very well last night, woke several times over the night  Full history not obtained from the nursing facility No paperwork available from the nursing facility to detail events, the list of her medications Triage note indicates " not feeling well" and not eating well past couple of days  Recent events when seen in the office, has had hypotension on numerous occasions, Medications have been held with as needed parameters for amlodipine 5 to be given above systolic pressure of 150  Unclear if she was given amlodipine this morning Blood pressure on arrival 140/65 supine at 8 AM, follow-up blood pressure 102/79 Most of her blood pressure is 120 up to 130 systolic through the course of the day, this evening 150 systolic, no distress Measuring on the right arm   Past Medical History:  Diagnosis Date  . Arthritis   . AV block,  Mobitz II    a. 09/2016 s/p MDT [id number] Azure XT DR MRI DC PPM  . Carotid artery disease ([name])    a. 09/2016 Carotid U/S: RICA 70-99%, LICA 50-69%;  b. 09/2016 CTA Neck: RICA 60, RCCA 30, LICA 70%, L Vert 60, L Basilar 70; c. 10/2019 s/p R CEA.  . Essential hypertension   . History of stress test    a. 12/2016 MV: EF 51%, no ischemia/infarct; b. 10/2019 MV: EF 61%, no ischemia/infarct.  . Hyperlipidemia   . Hypertrophic cardiomyopathy ([name])    a. 09/2016 Echo: EF 60-65%, no rwma, Gr1 DD, Ca2+ MV annulus; b. 11/2017 Echo: EF 70-75%, no rwma, Gr1 DD, mild AS vs dynamic LVOT obs. Nl RV fxn. Hyperdynamic LVEF w/ sev LVH; c. 10/2019 Echo: EF 70-75%, no rwma, gr1 DD, sev LVH, Gr1 DD, nl PASP, triv MR, mild-mod Ao sclerosis  . Presence of permanent cardiac pacemaker     Past Surgical History:  Procedure Laterality Date  . ABDOMINAL HYSTERECTOMY    . ENDARTERECTOMY Right 11/16/2019   Procedure: ENDARTERECTOMY CAROTID;  Surgeon: Renford DillsSchnier, Gregory G, MD;  Location: [facility] ORS;  Service: Vascular;  Laterality: Right;  . PACEMAKER IMPLANT N/A 10/08/2016   Procedure: Pacemaker Implant;  Surgeon: Marinus Mawaylor, Gregg W, MD;  [facility];  Service: Cardiovascular;  Laterality: N/A;     Home Medications:  Prior to Admission medications   Medication Sig Start Date End Date Taking? Authorizing Provider  acetaminophen (TYLENOL) 325 MG tablet Take 650 mg by mouth every 4 (four) hours as needed.  Yes [provider]  amLODipine (NORVASC) 5 MG tablet Take 1 tablet (5 mg total) by mouth daily as needed. Take for BP >150 systolic Patient taking differently: Take 5 mg by mouth daily. Use only if BP >150 systolic 04/11/20 6/96/78 Yes Keyia Moretto, Tollie Pizza, MD  aspirin EC 81 MG EC tablet Take 1 tablet (81 mg total) by mouth daily. Swallow whole. 11/09/19  Yes Wouk, Wilfred Curtis, MD  busPIRone (BUSPAR) 7.5 MG tablet Take 7.5 mg by mouth daily.  09/07/19  Yes [provider]  loperamide (IMODIUM) 2 MG  capsule Take 2 mg by mouth as needed. 12/07/19  Yes [provider]  nitrofurantoin (MACRODANTIN) 100 MG capsule Take 100 mg by mouth in the morning, at noon, in the evening, and at bedtime. 05/07/20  Yes [provider]  polyethylene glycol (MIRALAX) 17 g packet Take 17 g by mouth daily as needed for mild constipation. 11/28/19  Yes Alver Sorrow, NP  rosuvastatin (CRESTOR) 40 MG tablet Take 1 tablet (40 mg total) by mouth daily. 10/19/19  Yes Deneene Tarver, Tollie Pizza, MD  traMADol (ULTRAM) 50 MG tablet Take 50 mg by mouth every 6 (six) hours as needed. 04/23/20  Yes [provider]    Inpatient Medications: Scheduled Meds: . amLODipine  5 mg Oral Daily  . aspirin EC  81 mg Oral Daily  . busPIRone  7.5 mg Oral Daily  . enoxaparin (LOVENOX) injection  40 mg Subcutaneous Q24H  . nitrofurantoin  100 mg Oral Q12H  . rosuvastatin  40 mg Oral Daily  . sodium chloride flush  3 mL Intravenous Q12H   Continuous Infusions: . sodium chloride     PRN Meds: sodium chloride, acetaminophen, ondansetron **OR** ondansetron (ZOFRAN) IV, polyethylene glycol, sodium chloride flush, traMADol  Allergies:   No Known Allergies  Social History:   Social History   Socioeconomic History  . Marital status: Single    Spouse name: Not on file  . Number of children: Not on file  . Years of education: Not on file  . Highest education level: Not on file  Occupational History  . Not on file  Tobacco Use  . Smoking status: Former Smoker    Packs/day: 1.00    Years: 30.00    Pack years: 30.00    Types: Cigarettes    Quit date: 11/09/2018    Years since quitting: 1.5  . Smokeless tobacco: Never Used  Vaping Use  . Vaping Use: Never used  Substance and Sexual Activity  . Alcohol use: No  . Drug use: No  . Sexual activity: Not on file  Other Topics Concern  . Not on file  Social History Narrative   Independent at baseline   Ambulates without any assistance. Lives by herself    Social Determinants of Health   Financial Resource Strain: Not on file  Food Insecurity: Not on file  Transportation Needs: Not on file  Physical Activity: Not on file  Stress: Not on file  Social Connections: Not on file  Intimate Partner Violence: Not on file    Family History:    Family History  Problem Relation Age of Onset  . Diabetes Mother   . CAD Father      ROS:  Please see the history of present illness.  Review of Systems  Constitutional: Positive for malaise/fatigue.  HENT: Negative.   Respiratory: Negative.   Cardiovascular: Negative.   Gastrointestinal: Negative.   Musculoskeletal: Negative.   Neurological: Negative.   Psychiatric/Behavioral: Negative.  All other systems reviewed and are negative.   Physical Exam/Data:   Vitals:   05/18/20 1230 05/18/20 1330 05/18/20 1430 05/18/20 1630  BP: 117/84 135/68 (!) 147/53 (!) 163/70  Pulse: 74 80 77 80  Resp: 17 11 12 16   Temp:      TempSrc:      SpO2: 99% 99% 99% 97%  Weight:      Height:        Intake/Output Summary (Last 24 hours) at 05/18/2020 1821 Last data filed at 05/18/2020 1005 Gross per 24 hour  Intake --  Output 10 ml  Net -10 ml   Last 3 Weights 05/18/2020 04/11/2020 03/27/2020  Weight (lbs) 152 lb 1.9 oz 150 lb 148 lb  Weight (kg) 69 kg 68.04 kg 67.132 kg     Body mass index is 25.31 kg/Chen.  General:  Well nourished, well developed, in no acute distress HEENT: normal Lymph: no adenopathy Neck: no JVD Endocrine:  No thryomegaly Vascular: No carotid bruits; FA pulses 2+ bilaterally without bruits  Cardiac:  normal S1, S2; RRR; no murmur  Lungs:  clear to auscultation bilaterally, no wheezing, rhonchi or rales  Abd: soft, nontender, no hepatomegaly  Ext: no edema Musculoskeletal:  No deformities, BUE and BLE strength normal and equal Skin: warm and dry  Neuro:  CNs 2-12 intact, no focal abnormalities noted Psych:  Normal affect   EKG:  The EKG was personally reviewed and  demonstrates: Normal sinus rhythm with no significant ST-T wave changes, LVH  Telemetry:  Telemetry was personally reviewed and demonstrates:   Normal sinus rhythm, no arrhythmia noted rates in the 60-70 range  Relevant CV Studies: Prior echo with normal LV function, severe LVH  Laboratory Data:  High Sensitivity Troponin:   Recent Labs  Lab 05/18/20 0816 05/18/20 0945 05/18/20 1246  TROPONINIHS 133* 145* 187*     Chemistry Recent Labs  Lab 05/18/20 0816  NA 140  K 3.9  CL 108  CO2 22  GLUCOSE 195*  BUN 23  CREATININE 1.06*  CALCIUM 9.3  GFRNONAA 54*  ANIONGAP 10    Recent Labs  Lab 05/18/20 0816  PROT 7.5  ALBUMIN 3.7  AST 24  ALT 13  ALKPHOS 63  BILITOT 1.6*   Hematology Recent Labs  Lab 05/18/20 0816  WBC 4.5  RBC 4.49  HGB 12.0  HCT 38.6  MCV 86.0  MCH 26.7  MCHC 31.1  RDW 14.5  PLT 144*   BNPNo results for input(s): BNP, PROBNP in the last 168 hours.  DDimer No results for input(s): DDIMER in the last 168 hours.   Radiology/Studies:  DG Chest Portable 1 View  Result Date: 05/18/2020 CLINICAL DATA:  Weakness and decreased appetite EXAM: PORTABLE CHEST 1 VIEW COMPARISON:  November 16, 2019 FINDINGS: Lungs are clear. Heart is upper normal in size with pulmonary vascularity are normal. Pacemaker leads are attached to the right heart. There is aortic atherosclerosis. No adenopathy. No bone lesions. IMPRESSION: Lungs clear. Heart upper normal in size. Pacemaker lead positions unchanged. Aortic Atherosclerosis (ICD10-I70.0). Electronically Signed   By: November 18, 2019 III Chen.D.   On: 05/18/2020 08:25     Assessment and Plan:   1. Elevated troponin In the setting of severe LVH Episode of weakness unclear, She does have history of labile hypertension Most of her medications held for markedly depressed pressures noted in the office.  -Likely no need to repeat stress testing at this time as she is asymptomatic, atypical presentation  History of  syncope --Prior episode of syncope September 2021, work-up at that time with stress test showing no ischemia, echocardiogram with normal LV function with severe LVH -Felt secondary to volume depletion in the setting of LVH Hold diuretics held at that time --Hypotensive episodes noted in the office, many of her blood pressure medications held ----At the time of discharge will need to encourage p.o. fluid intake -Unclear with her dementia whether she has adequate intake  Severe LVH Previously treated with metoprolol 25 twice daily Also amlodipine 2.5, though this was held secondary to hypotension now with hold parameters -Potentially could reintroduce metoprolol  Labile hypertension Documented low blood pressures in the office this year, most of her medications held -Likely need to run it on the high side given history of syncope and near syncope -Avoid diuretics -Could consider metoprolol tartrate given severe LVH Amlodipine with hold parameters -Possible neurocardiogenic etiology in the setting of dementia, known severe carotid disease, prior carotid endarterectomy   Total encounter time more than 110 minutes  Greater than 50% was spent in counseling and coordination of care with the patient   For questions or updates, please contact [facility] Please consult [email or web address] for contact info under    Signed, Julien Nordmann, MD  05/18/2020 6:21 PM

## 2020-05-18 NOTE — ED Triage Notes (Signed)
Pt BIBA from a care home for generalized weakness and just "not feeling well" x couple days. Pt has not been eating good x couple days. CBG 161. Hx anxiety, alzheimer's and pacemaker.
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
## 2020-05-18 NOTE — ED Notes (Signed)
IV attempt x1 by this RN. Pt tolerated well.  

## 2020-05-18 NOTE — ED Notes (Signed)
Pt resting quietly. Call light within reach. Fall precautions in place.  

## 2020-05-18 NOTE — ED Notes (Signed)
Patient is resting comfortably. Fall precautions in place. Call light in reach.  

## 2020-05-19 ENCOUNTER — Observation Stay (HOSPITAL_BASED_OUTPATIENT_CLINIC_OR_DEPARTMENT_OTHER)
Admit: 2020-05-19 | Discharge: 2020-05-19 | Disposition: A | Discharge status: Home / Self Care | Payer: Medicare HMO | Attending: Internal Medicine | Admitting: Internal Medicine

## 2020-05-19 DIAGNOSIS — R079 Chest pain, unspecified: Secondary | ICD-10-CM | POA: Diagnosis not present

## 2020-05-19 DIAGNOSIS — R531 Weakness: Secondary | ICD-10-CM | POA: Diagnosis not present

## 2020-05-19 DIAGNOSIS — I10 Essential (primary) hypertension: Secondary | ICD-10-CM | POA: Diagnosis not present

## 2020-05-19 DIAGNOSIS — E785 Hyperlipidemia, unspecified: Secondary | ICD-10-CM | POA: Diagnosis not present

## 2020-05-19 DIAGNOSIS — R778 Other specified abnormalities of plasma proteins: Secondary | ICD-10-CM | POA: Diagnosis not present

## 2020-05-19 LAB — GLUCOSE, CAPILLARY: Glucose-Capillary: 162 mg/dL — ABNORMAL HIGH (ref 70–99)

## 2020-05-19 LAB — ECHOCARDIOGRAM COMPLETE
AR max vel: 1.96 cm2
AV Peak grad: 9.4 mmHg
Ao pk vel: 1.53 m/s
Area-P 1/2: 3.89 cm2
Height: 65 in
S' Lateral: 2.67 cm
Weight: 2451.52 oz

## 2020-05-19 MED ORDER — METOPROLOL TARTRATE 25 MG PO TABS
12.5000 mg | ORAL_TABLET | Freq: Two times a day (BID) | ORAL | Status: DC
  Administered 2020-05-19 – 2020-05-21 (×5): 12.5 mg via ORAL
  Filled 2020-05-19 (×5): qty 1

## 2020-05-19 NOTE — Plan of Care (Signed)

## 2020-05-19 NOTE — TOC Progression Note (Addendum)
Transition of Care [facility]) - Progression Note    Patient Details  Name: Shannon Chen MRN: [id number] Date of Birth: 1945/11/02  [facility]) CM/SW Contact  Bing Quarry, RN Phone Number: 05/19/2020, 12:12 PM  Clinical Narrative: Contacted SNF patient arrived from. [facility] at [phone]. Staff answered phone at 1134, took information and said she would have AC/CN call me back. Awaiting return call. At 1213 still awaiting return call on return status for today. Gabriel Cirri RN CM   1420: No call back yet form [facility].  Gabriel Cirri RN CM [phone]        Expected Discharge Plan and Services                                                 Social Determinants of Health (SDOH) Interventions    Readmission Risk Interventions No flowsheet data found.

## 2020-05-19 NOTE — Care Management Obs Status (Signed)
MEDICARE OBSERVATION STATUS NOTIFICATION   Patient Details  Name: Shannon Chen MRN: [id number] Date of Birth: 02-01-1946   Medicare Observation Status Notification Given:  Yes  Explained to patient and printed to unit for RN to give to patient.     Bing Quarry, RN 05/19/2020, 4:58 PM

## 2020-05-19 NOTE — Progress Notes (Addendum)
PROGRESS NOTE    Shannon Chen  WER:[id number] DOB: 21-Sep-1945 DOA: 05/18/2020 PCP: Franciso Bend, NP  [id number]   Assessment & Plan:   Principal Problem:   Weakness Active Problems:   Hypertension   LVH (left ventricular hypertrophy) due to hypertensive disease, without heart failure   S/P placement of cardiac pacemaker   Elevated troponin   Hypertrophic cardiomyopathy ([name])   Shannon Chen is a 75 y.o. 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 Per patient she states that the nursing home staff thought that she "fell out" which she denies.   Weakness --general deconditioning and reported poor oral intake --PT --return to SNF  Labile Hypertension Documented low blood pressures in cardiology office this year, most of her medications held -Likely need to run it on the high side given history of syncope and near syncope Plan: -Avoid diuretics --start Lopressor 12.5 mg BID given severe LVH, per cardiology --cont home amlodipine  Elevated troponin levels --Trop 204, in the setting of severe LVH.  No chest pain. --cardiology consulted --Echo --No further evaluation planned  Depression --cont BuSpar   DVT prophylaxis: Lovenox SQ Code Status: Full code  Family Communication:  Level of care: Progressive Cardiac  ACS ruled out.  Can transfer out of PCU. Dispo:   The patient is from: SNF Anticipated d/c is to: SNF Anticipated d/c date is: Whenever SNF can take pt back Patient currently is medically ready to d/c.   SNF was notified earlier today that pt was ready to be discharged, however, SNF had not accepted pt back.   Subjective and Interval History:  Pt denied  pain, said that she felt stronger.     Objective: Vitals:   05/19/20 0500 05/19/20 0758 05/19/20 1204 05/19/20 1519  BP:  (!) 176/76 (!) 145/68 (!) 137/53  Pulse:  79 71 72  Resp:  17 18   Temp:  97.7 F (36.5 C) 97.6 F (36.4 C)   TempSrc:  Oral Oral   SpO2:  93% 99% 99%  Weight: 69.5 kg     Height:        Intake/Output Summary (Last 24 hours) at 05/19/2020 1553 Last data filed at 05/19/2020 1300 Gross per 24 hour  Intake 603 ml  Output 0 ml  Net 603 ml   Filed Weights   05/18/20 0801 05/18/20 2000 05/19/20 0500  Weight: 69 kg 69.3 kg 69.5 kg    Examination:   Constitutional: NAD, alert, oriented to person and place, slow to response HEENT: conjunctivae and lids normal, EOMI CV: No cyanosis.   RESP: normal respiratory effort, on RA Extremities: No effusions, edema in BLE SKIN: warm, dry Neuro: II - XII grossly intact.   Psych: Normal mood and affect.     Data Reviewed: I have personally reviewed following labs and imaging studies  CBC: Recent Labs  Lab 05/18/20 0816  WBC 4.5  NEUTROABS 2.3  HGB 12.0  HCT 38.6  MCV 86.0  PLT 144*   Basic Metabolic Panel: Recent Labs  Lab 05/18/20 0816  NA 140  K 3.9  CL 108  CO2 22  GLUCOSE 195*  BUN 23  CREATININE 1.06*  CALCIUM 9.3   GFR: Estimated Creatinine Clearance: 44.2 mL/min (A) (by  C-G formula based on SCr of 1.06 mg/dL (H)). Liver Function Tests: Recent Labs  Lab 05/18/20 0816  AST 24  ALT 13  ALKPHOS 63  BILITOT 1.6*  PROT 7.5  ALBUMIN 3.7   No results for input(s): LIPASE, AMYLASE in the last 168 hours. No results for input(s): AMMONIA in the last 168 hours. Coagulation Profile: No results for input(s): INR, PROTIME in the last 168 hours. Cardiac Enzymes: Recent Labs  Lab 05/18/20 1600  CKTOTAL 65   BNP (last 3 results) No results for input(s): PROBNP in the last 8760 hours. HbA1C: No results for input(s): HGBA1C in the last 72 hours. CBG: No results for input(s): GLUCAP in the  last 168 hours. Lipid Profile: No results for input(s): CHOL, HDL, LDLCALC, TRIG, CHOLHDL, LDLDIRECT in the last 72 hours. Thyroid Function Tests: No results for input(s): TSH, T4TOTAL, FREET4, T3FREE, THYROIDAB in the last 72 hours. Anemia Panel: No results for input(s): VITAMINB12, FOLATE, FERRITIN, TIBC, IRON, RETICCTPCT in the last 72 hours. Sepsis Labs: Recent Labs  Lab 05/18/20 0945 05/18/20 1246  LATICACIDVEN 1.3 1.1    Recent Results (from the past 240 hour(s))  Resp Panel by RT-PCR (Flu A&B, Covid) Nasopharyngeal Swab     Status: None   Collection Time: 05/18/20 11:02 AM   Specimen: Nasopharyngeal Swab; Nasopharyngeal(NP) swabs in vial transport medium  Result Value Ref Range Status   SARS Coronavirus 2 by RT PCR NEGATIVE NEGATIVE Final    Comment: (NOTE) SARS-CoV-2 target nucleic acids are NOT DETECTED.  The SARS-CoV-2 RNA is generally detectable in upper respiratory specimens during the acute phase of infection. The lowest concentration of SARS-CoV-2 viral copies this assay can detect is 138 copies/mL. A negative result does not preclude SARS-Cov-2 infection and should not be used as the sole basis for treatment or other patient management decisions. A negative result may occur with  improper specimen collection/handling, submission of specimen other than nasopharyngeal swab, presence of viral mutation(s) within the areas targeted by this assay, and inadequate number of viral copies(<138 copies/mL). A negative result must be combined with clinical observations, patient history, and epidemiological information. The expected result is Negative.  Fact Sheet for Patients:  BloggerCourse.com  Fact Sheet for Healthcare Providers:  SeriousBroker.it  This test is no t yet approved or cleared by the [place] FDA and  has been authorized for detection and/or diagnosis of SARS-CoV-2 by FDA under an Emergency Use  Authorization (EUA). This EUA will remain  in effect (meaning this test can be used) for the duration of the COVID-19 declaration under Section 564(b)(1) of the Act, 21 U.S.C.section 360bbb-3(b)(1), unless the authorization is terminated  or revoked sooner.       Influenza A by PCR NEGATIVE NEGATIVE Final   Influenza B by PCR NEGATIVE NEGATIVE Final    Comment: (NOTE) The Xpert Xpress SARS-CoV-2/FLU/RSV plus assay is intended as an aid in the diagnosis of influenza from Nasopharyngeal swab specimens and should not be used as a sole basis for treatment. Nasal washings and aspirates are unacceptable for Xpert Xpress SARS-CoV-2/FLU/RSV testing.  Fact Sheet for Patients: BloggerCourse.com  Fact Sheet for Healthcare Providers: SeriousBroker.it  This test is not yet approved or cleared by the [place] FDA and has been authorized for detection and/or diagnosis of SARS-CoV-2 by FDA under an Emergency Use Authorization (EUA). This EUA will remain in effect (meaning this test can be used) for the duration of the COVID-19 declaration under Section 564(b)(1) of the Act, 21 U.S.C. section 360bbb-3(b)(1),  unless the authorization is terminated or revoked.  Performed at [facility], [address], [place], [place] [address]       Radiology Studies: DG Chest Portable 1 View  Result Date: 05/18/2020 CLINICAL DATA:  Weakness and decreased appetite EXAM: PORTABLE CHEST 1 VIEW COMPARISON:  November 16, 2019 FINDINGS: Lungs are clear. Heart is upper normal in size with pulmonary vascularity are normal. Pacemaker leads are attached to the right heart. There is aortic atherosclerosis. No adenopathy. No bone lesions. IMPRESSION: Lungs clear. Heart upper normal in size. Pacemaker lead positions unchanged. Aortic Atherosclerosis (ICD10-I70.0). Electronically Signed   By: Bretta Bang III M.D.   On: 05/18/2020 08:25    ECHOCARDIOGRAM COMPLETE  Result Date: 05/19/2020    ECHOCARDIOGRAM REPORT   Patient Name:   Shannon Chen Date of Exam: 05/19/2020 Medical Rec #:  [id number]       Height:       65.0 in Accession #:    [id number]      Weight:       153.2 lb Date of Birth:  Jul 01, 1945       BSA:          1.766 m Patient Age:    74 years        BP:           176/76 mmHg Patient Gender: F               HR:           79 bpm. Exam Location:  [facility] Procedure: 2D Echo, Cardiac Doppler and Color Doppler Indications:    Chest Pain R07.9  History:        Patient has prior history of Echocardiogram examinations. CAD.                 [name].  Sonographer:    Neysa Bonito Roar Referring Phys: [id number] AGBATA Diagnosing      Julien Nordmann MD Phys: IMPRESSIONS  1. Left ventricular ejection fraction, by estimation, is 60 to 65%. The left ventricle has normal function. The left ventricle has no regional wall motion abnormalities. There is severe concentric left ventricular hypertrophy, notably around the mid to distal LV walls and apical region. Left ventricular diastolic parameters are consistent with Grade I diastolic dysfunction (impaired relaxation).  2. Right ventricular systolic function is normal. The right ventricular size is normal. There is normal pulmonary artery systolic pressure. The estimated right ventricular systolic pressure is 21.8 mmHg.  3. Left atrial size was mildly dilated. FINDINGS  Left Ventricle: Left ventricular ejection fraction, by estimation, is 60 to 65%. The left ventricle has normal function. The left ventricle has no regional wall motion abnormalities. The left ventricular internal cavity size was normal in size. There is  severe concentric left ventricular hypertrophy. Left ventricular diastolic parameters are consistent with Grade I diastolic dysfunction (impaired relaxation). Right Ventricle: The right ventricular size is normal. No increase in right ventricular wall thickness. Right ventricular systolic  function is normal. There is normal pulmonary artery systolic pressure. The tricuspid regurgitant velocity is 2.05 m/s, and  with an assumed right atrial pressure of 5 mmHg, the estimated right ventricular systolic pressure is 21.8 mmHg. Left Atrium: Left atrial size was mildly dilated. Right Atrium: Right atrial size was normal in size. Pericardium: There is no evidence of pericardial effusion. Mitral Valve: The mitral valve is normal in structure. There is mild thickening of the mitral valve leaflet(s). Mild mitral annular calcification. No evidence  of mitral valve regurgitation. No evidence of mitral valve stenosis. Tricuspid Valve: The tricuspid valve is normal in structure. Tricuspid valve regurgitation is mild . No evidence of tricuspid stenosis. Aortic Valve: The aortic valve is normal in structure. Aortic valve regurgitation is not visualized. Mild aortic valve sclerosis is present, with no evidence of aortic valve stenosis. Aortic valve peak gradient measures 9.4 mmHg. Pulmonic Valve: The pulmonic valve was normal in structure. Pulmonic valve regurgitation is not visualized. No evidence of pulmonic stenosis. Aorta: The aortic root is normal in size and structure. Venous: The inferior vena cava is normal in size with greater than 50% respiratory variability, suggesting right atrial pressure of 3 mmHg. IAS/Shunts: No atrial level shunt detected by color flow Doppler.  LEFT VENTRICLE PLAX 2D LVIDd:         3.84 cm  Diastology LVIDs:         2.67 cm  LV e' medial:    4.68 cm/s LV PW:         1.22 cm  LV E/e' medial:  13.6 LV IVS:        1.55 cm  LV e' lateral:   7.29 cm/s LVOT diam:     1.80 cm  LV E/e' lateral: 8.8 LVOT Area:     2.54 cm  RIGHT VENTRICLE RV Mid diam:    2.93 cm LEFT ATRIUM             Index       RIGHT ATRIUM           Index LA diam:        4.00 cm 2.26 cm/m  RA Area:     14.40 cm LA Vol (A2C):   44.8 ml 25.36 ml/m RA Volume:   37.20 ml  21.06 ml/m LA Vol (A4C):   54.5 ml 30.85 ml/m LA  Biplane Vol: 49.7 ml 28.14 ml/m  AORTIC VALVE                PULMONIC VALVE AV Area (Vmax): 1.96 cm    PV Vmax:        1.32 m/s AV Vmax:        153.00 cm/s PV Peak grad:   7.0 mmHg AV Peak Grad:   9.4 mmHg    RVOT Peak grad: 3 mmHg LVOT Vmax:      118.00 cm/s  AORTA Ao Root diam: 2.40 cm MITRAL VALVE                TRICUSPID VALVE MV Area (PHT): 3.89 cm     TR Peak grad:   16.8 mmHg MV Decel Time: 195 msec     TR Vmax:        205.00 cm/s MV E velocity: 63.80 cm/s MV A velocity: 101.00 cm/s  SHUNTS MV E/A ratio:  0.63         Systemic Diam: 1.80 cm MV A Prime:    9.2 cm/s Julien Nordmann MD Electronically signed by Julien Nordmann MD Signature Date/Time: 05/19/2020/2:54:06 PM    Final      Scheduled Meds: . amLODipine  5 mg Oral Daily  . aspirin EC  81 mg Oral Daily  . busPIRone  7.5 mg Oral Daily  . enoxaparin (LOVENOX) injection  40 mg Subcutaneous Q24H  . metoprolol tartrate  12.5 mg Oral BID  . rosuvastatin  40 mg Oral Daily  . sodium chloride flush  3 mL Intravenous Q12H   Continuous Infusions: . sodium chloride  LOS: 0 days     Darlin Priestlyina Florance Paolillo, MD [facility] If 7PM-7AM, please contact night-coverage 05/19/2020, 3:53 PM

## 2020-05-19 NOTE — Progress Notes (Addendum)
Progress Note  Patient Name: Shannon Chen Date of Encounter: 05/19/2020  Primary Cardiologist: [name]  Subjective   She feels much better today. She wonders if this episode of weakness was in the setting of "sitting down to eat breakfast too late." No chest pain, dyspnea, palpitations, or syncope.   Inpatient Medications    Scheduled Meds: . amLODipine  5 mg Oral Daily  . aspirin EC  81 mg Oral Daily  . busPIRone  7.5 mg Oral Daily  . enoxaparin (LOVENOX) injection  40 mg Subcutaneous Q24H  . rosuvastatin  40 mg Oral Daily  . sodium chloride flush  3 mL Intravenous Q12H   Continuous Infusions: . sodium chloride     PRN Meds: sodium chloride, acetaminophen, ondansetron **OR** ondansetron (ZOFRAN) IV, polyethylene glycol, sodium chloride flush, traMADol   Vital Signs    Vitals:   05/18/20 2000 05/19/20 0425 05/19/20 0500 05/19/20 0758  BP:  139/83  (!) 176/76  Pulse:  75  79  Resp:  18    Temp:  97.6 F (36.4 C)  97.7 F (36.5 C)  TempSrc:  Oral  Oral  SpO2:  97%  (!) 84%  Weight: 69.3 kg  69.5 kg   Height: 5\' 5"  (1.651 m)       Intake/Output Summary (Last 24 hours) at 05/19/2020 0903 Last data filed at 05/18/2020 2123 Gross per 24 hour  Intake 243 ml  Output 10 ml  Net 233 ml   Filed Weights   05/18/20 0801 05/18/20 2000 05/19/20 0500  Weight: 69 kg 69.3 kg 69.5 kg    Telemetry    SR - Personally Reviewed  ECG    No new tracings - Personally Reviewed  Physical Exam   GEN: No acute distress.   Neck: No JVD. Cardiac: RRR, no murmurs, rubs, or gallops.  Respiratory: Clear to auscultation bilaterally.  GI: Soft, nontender, non-distended.   MS: No edema; No deformity. Neuro:  Alert and oriented x 3; Nonfocal.  Psych: Normal affect.  Labs    Chemistry Recent Labs  Lab 05/18/20 0816  NA 140  K 3.9  CL 108  CO2 22  GLUCOSE 195*  BUN 23  CREATININE 1.06*  CALCIUM 9.3  PROT 7.5  ALBUMIN 3.7  AST 24  ALT 13  ALKPHOS 63  BILITOT 1.6*   GFRNONAA 54*  ANIONGAP 10     Hematology Recent Labs  Lab 05/18/20 0816  WBC 4.5  RBC 4.49  HGB 12.0  HCT 38.6  MCV 86.0  MCH 26.7  MCHC 31.1  RDW 14.5  PLT 144*    Cardiac EnzymesNo results for input(s): TROPONINI in the last 168 hours. No results for input(s): TROPIPOC in the last 168 hours.   BNPNo results for input(s): BNP, PROBNP in the last 168 hours.   DDimer No results for input(s): DDIMER in the last 168 hours.   Radiology    DG Chest Portable 1 View  Result Date: 05/18/2020 IMPRESSION: Lungs clear. Heart upper normal in size. Pacemaker lead positions unchanged. Aortic Atherosclerosis (ICD10-I70.0). Electronically Signed   By: 07/18/2020 III M.D.   On: 05/18/2020 08:25    Cardiac Studies   2D echo 09/2016: - Left ventricle: The cavity size was normal. There was moderate  concentric hypertrophy. Cavity obliteration in systole. Unable to  exclude LVOT gradient. Systolic function was normal. The  estimated ejection fraction was in the range of 60% to 65%. Wall  motion was normal; there were no regional  wall motion  abnormalities. Doppler parameters are consistent with abnormal  left ventricular relaxation (grade 1 diastolic dysfunction).  - Mitral valve: Calcified annulus.  - Left atrium: The atrium was normal in size.  - Right ventricle: Systolic function was normal.  - Pulmonary arteries: Systolic pressure could not be accurately  estimated.  - Inferior vena cava: The vessel was small size. . The  respirophasic diameter changes were >= 50%, consistent with low  central venous pressure.  __________  2D echo 11/2017: - Left ventricle: The cavity size was normal. Wall thickness was  increased in a pattern of severe LVH. The basal inferior wall is  relatively spared. Systolic function was hyperdynamic. The  estimated ejection fraction was in the range of 70% to 75%. Wall  motion was normal; there were no regional wall  motion  abnormalities. Doppler parameters are consistent with abnormal  left ventricular relaxation (grade 1 diastolic dysfunction).  - Aortic valve: Transvalvular velocity was minimally increased.  There was very mild stenosis versus dynamic LVOT obstruction.  - Mitral valve: Calcified annulus.  - Right ventricle: The cavity size was normal. Systolic function  was normal.   Impressions:   - Hyperdynamic LVEF with severe LVH. Hypertrophic cardiomyopathy  and dynamic LVOT obstruction (HOCM) cannot be excluded. __________  2D echo 04/2018: 1. The left ventricle has normal systolic function with an ejection  fraction of 60-65%. The cavity size was decreased. There is severely  increased left ventricular wall thickness. Near cavity obliteration in  systole. Left ventricular diastolic Doppler  parameters are consistent with impaired relaxation.  2. The right ventricle has normal systolic function. The cavity was  normal. There is no increase in right ventricular wall thickness. Unable  to estimate RVSP  3. Left atrial size was mildly dilated.  __________  2D echo 08/2019: -EF 54%, normal wall motion, diastolic dysfunction, tricuspid regurgitation, normal RV systolic function and ventricular cavity size, normal PASP,normal size and structure aortic root __________  Lexiscan MPI 10/2019:  ST segment depression was noted during stress in the V3, V4, V5, V6, I, II, III and aVF leads.  T wave inversion was noted during stress. T wave inversion persisted.  The study is normal.  This is a low risk study.  The left ventricular ejection fraction is normal (61%).  There is no evidence for ischemia __________  2D echo 10/2019: 1. Left ventricular ejection fraction, by estimation, is 70 to 75%. The  left ventricle has hyperdynamic function. The left ventricle has no  regional wall motion abnormalities. There is severe left ventricular  hypertrophy. Left ventricular  diastolic  parameters are consistent with Grade I diastolic dysfunction (impaired  relaxation). Elevated left atrial pressure.  2. Right ventricular systolic function is mildly reduced. The right  ventricular size is normal. There is normal pulmonary artery systolic  pressure.  3. The mitral valve is normal in structure. Trivial mitral valve  regurgitation. No evidence of mitral stenosis.  4. The aortic valve has an indeterminant number of cusps. There is mild  calcification of the aortic valve. There is moderate thickening of the  aortic valve. Aortic valve regurgitation is not visualized. Mild to  moderate aortic valve  sclerosis/calcification is present, without any evidence of aortic  stenosis.  5. The inferior vena cava is normal in size with greater than 50%  respiratory variability, suggesting right atrial pressure of 3 mmHg. __________  2D echo pending  Patient Profile     75 y.o. female with history of LVH with  possible hypertrophic cardiomyopathy, syncope with second-degree AV block type II status post [employer] PPM in 09/2016, recurrent syncope felt to be in the setting of poor p.o. intake and orthostasis, carotid artery disease status post right-sided CEA in 10/2019, prior CVA, NSVT, long history of tobacco use quitting in 2020, HTN, HLD, and medication nonadherence who we are seeing for elevated troponin.   Assessment & Plan    1. Elevated high sensitivity troponin: -Felt to be in the setting of known severe LVH -Echo pending -Flat trending and not consistent with ACS -No indication for heparin gtt at this time -Recent Lexiscan MPI low risk as above  2. Severe LVH with possible HCM: -Echoes have indicated severe LVH, possibly enough which could be related to hypertrophic cardiomyopathy.  She has been evaluated by EP with no children noted and with the patient being old enough that risk stratification algorithms no longer applied.  No further syncopal episodes -Echo  pending as above -Do not anticipate further cardiac workup at this time  3. Labile HTN: -Recommendations have been for permissive BP -This along with poor po intake possibly contributing to her weakness -BP high this morning -Amlodipine -Could consider low dose metoprolol, Fredrik Mogel discuss with MD -Historically, her medications have had to be held for labile BP -Recommend adequate fluid intake  4. History of NSVT: -Previously evaluated by EP without recommendation for ICD -No ventricular ectopy noted on tele Consider reintroducing low dose metoprolol, Jennise Both discuss with MD  5. History of syncope/2nd degree AV block type II: -Status post PPM -Device appears to be functioning normally  -Followed by EP -Her syncope has been felt to be in the setting of intravascular volume depletion related to poor p.o. intake and possibly overmedication in this setting  6. HLD: -LDL 185 in 2018 with goal being < 70 -Concerns for medication nonadherence in the outpatient setting -Crestor -Outpatient follow up   For questions or updates, please contact [facility] Please consult [email or web address] for contact info under Cardiology/STEMI.    Signed, Eula Listen, PA-C [facility] Pager: [phone] 05/19/2020, 9:03 AM   I have seen and examined this patient with Eula Listen.  Agree with above, note added to reflect my findings.  On exam, RRR, no murmurs, no LE edema.  Patient feeling well today.  She has no chest pain and denies shortness of breath.  It is certainly possible that her elevated troponin is due to LVH and is related to demand ischemia.  As she is not having chest pain, no further cardiology work-up is necessary at this time.  We Colisha Redler arrange for follow-up in cardiology clinic.  Lashonda Sonneborn M. Chaseton Yepiz MD 05/19/2020 11:16 AM

## 2020-05-19 NOTE — Progress Notes (Signed)
PT Cancellation Note  Patient Details Name: Shannon Chen MRN: [id number] DOB: Oct 13, 1945   Cancelled Treatment:    Reason Eval/Treat Not Completed: Patient declined, no reason specified (Patient eating and declined PT eval this evening. Will attempt at later time/date as appropriate.)   Luretha Murphy. Ilsa Iha, PT, DPT 05/19/20, 5:19 PM

## 2020-05-19 NOTE — Progress Notes (Signed)
*  PRELIMINARY RESULTS* Echocardiogram 2D Echocardiogram has been performed.  Shannon Chen 05/19/2020, 12:58 PM

## 2020-05-20 DIAGNOSIS — R531 Weakness: Secondary | ICD-10-CM | POA: Diagnosis not present

## 2020-05-20 LAB — CBC
HCT: 36.8 % (ref 36.0–46.0)
Hemoglobin: 11.4 g/dL — ABNORMAL LOW (ref 12.0–15.0)
MCH: 26.6 pg (ref 26.0–34.0)
MCHC: 31 g/dL (ref 30.0–36.0)
MCV: 86 fL (ref 80.0–100.0)
Platelets: 204 10*3/uL (ref 150–400)
RBC: 4.28 MIL/uL (ref 3.87–5.11)
RDW: 14.6 % (ref 11.5–15.5)
WBC: 5 10*3/uL (ref 4.0–10.5)
nRBC: 0 % (ref 0.0–0.2)

## 2020-05-20 LAB — BASIC METABOLIC PANEL
Anion gap: 7 (ref 5–15)
BUN: 24 mg/dL — ABNORMAL HIGH (ref 8–23)
CO2: 25 mmol/L (ref 22–32)
Calcium: 9.1 mg/dL (ref 8.9–10.3)
Chloride: 107 mmol/L (ref 98–111)
Creatinine, Ser: 0.92 mg/dL (ref 0.44–1.00)
GFR, Estimated: >60 mL/min (ref >60)
Glucose, Bld: 102 mg/dL — ABNORMAL HIGH (ref 70–99)
Potassium: 3.7 mmol/L (ref 3.5–5.1)
Sodium: 139 mmol/L (ref 135–145)

## 2020-05-20 LAB — MRSA PCR SCREENING: MRSA by PCR: NEGATIVE

## 2020-05-20 LAB — MAGNESIUM: Magnesium: 2.1 mg/dL (ref 1.7–2.4)

## 2020-05-20 NOTE — Progress Notes (Signed)
Patient slept off and on through the night. Denied pain or discomfort. Patient noted with increased urine frequency. Endorsed to Public relations account executive.

## 2020-05-20 NOTE — Evaluation (Signed)
Physical Therapy Evaluation Patient Details Name: Shannon Chen MRN: [id number] DOB: 10/16/1945 Today's Date: 05/20/2020   History of Present Illness  Shannon Chen is a 74yoF presents to the emergency room via EMS from SNF [facility] for evaluation of weakness and poor oral intake. 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  Clinical Impression  Pt admitted with above diagnosis. Pt currently with functional limitations due to the deficits listed below (see "PT Problem List"). Upon entry, pt in bed, awake and agreeable to participate. The pt is alert, pleasant, interactive, and able to provide info regarding prior level of function, both in tolerance and independence. minA for OOB, then supervision for transfers and AMB with RW. Patient's performance this date reveals decreased ability, independence, and tolerance in performing all basic mobility required for performance of activities of daily living, however she is only mildly off her baseline in strength and balance per her report. Pt requires additional DME, close physical assistance, and cues for safe participate in mobility. Pt will benefit from skilled PT intervention to increase independence and safety with basic mobility in preparation for discharge to the venue listed below.       Follow Up Recommendations Home health PT    Equipment Recommendations  None recommended by PT    Recommendations for Other Services       Precautions / Restrictions Precautions Precautions: Fall Restrictions Weight Bearing Restrictions: No      Mobility  Bed Mobility Overal bed mobility: Needs Assistance Bed Mobility: Supine to Sit;Sit to Supine     Supine to sit: Min assist Sit to supine: Supervision        Transfers Overall transfer level: Needs assistance Equipment used: Rolling walker (2 wheeled) Transfers: Sit to/from Stand Sit to Stand: Supervision             Ambulation/Gait Ambulation/Gait assistance: Supervision;Min guard Gait Distance (Feet): 190 Feet Assistive device: Rolling walker (2 wheeled) Gait Pattern/deviations: WFL(Within Functional Limits)     General Gait Details: gets tired after 115ft, stops in hallway for brief pause  Stairs            Wheelchair Mobility    Modified Rankin (Stroke Patients Only)       Balance Overall balance assessment: Modified Independent                                           Pertinent Vitals/Pain Pain Assessment: No/denies pain    Home Living Family/patient expects to be discharged to:: Other (Comment)                 Additional Comments: alternates RW or cane, but does not walk long distances at baseline    Prior Function Level of Independence: Independent with assistive device(s)               Hand Dominance   Dominant Hand: Right    Extremity/Trunk Assessment   Upper Extremity Assessment Upper Extremity Assessment: Generalized weakness;Overall [facility] for tasks assessed    Lower Extremity Assessment Lower Extremity Assessment: Generalized weakness;Overall WFL for tasks assessed       Communication      Cognition Arousal/Alertness: Awake/alert Behavior During Therapy: WFL for tasks assessed/performed Overall Cognitive Status: Within Functional Limits for tasks assessed  General Comments      Exercises     Assessment/Plan    PT Assessment Patient needs continued PT services  PT Problem List Decreased strength;Decreased activity tolerance;Decreased range of motion;Decreased balance       PT Treatment Interventions Balance training;Functional mobility training;Therapeutic activities;Stair training;Therapeutic exercise;Patient/family education    PT Goals (Current goals can be found in the Care Plan section)  Acute Rehab PT Goals Patient Stated Goal: regain strength and  tolerance to AMB PT Goal Formulation: With patient Time For Goal Achievement: 06/03/20 Potential to Achieve Goals: Good    Frequency Min 2X/week   Barriers to discharge        Co-evaluation               AM-PAC PT "6 Clicks" Mobility  Outcome Measure Help needed turning from your back to your side while in a flat bed without using bedrails?: A Little Help needed moving from lying on your back to sitting on the side of a flat bed without using bedrails?: A Little Help needed moving to and from a bed to a chair (including a wheelchair)?: A Little Help needed standing up from a chair using your arms (e.g., wheelchair or bedside chair)?: A Little Help needed to walk in hospital room?: A Little Help needed climbing 3-5 steps with a railing? : A Little 6 Click Score: 18    End of Session Equipment Utilized During Treatment: Gait belt Activity Tolerance: Patient tolerated treatment well;Patient limited by fatigue Patient left: in bed;with call bell/phone within reach;with bed alarm set Nurse Communication: Mobility status (tele battery dead) PT Visit Diagnosis: Unsteadiness on feet (R26.81);Other abnormalities of gait and mobility (R26.89);Difficulty in walking, not elsewhere classified (R26.2)    Time: 2549-8264 PT Time Calculation (min) (ACUTE ONLY): 20 min   Charges:   PT Evaluation $PT Eval Moderate Complexity: 1 Mod          1:15 PM, 05/20/20 Rosamaria Lints, PT, DPT Physical Therapist - [facility]  [phone] (ASCOM)    Elecia Serafin C 05/20/2020, 1:14 PM

## 2020-05-20 NOTE — Progress Notes (Signed)
Pt transferred to 116 for observation awaiting return to her facility.  Pt is alert and oriented on room air, denies pain. Connected to telemetry, call bell within reach

## 2020-05-20 NOTE — Progress Notes (Signed)
PROGRESS NOTE    Romie JumperVinnie M Mitschke  GNF:[id number] DOB: [birth year] DOA: 05/18/2020 PCP: Franciso Bendogers, Jennifer B, NP  116A/[id number]-AA   Assessment & Plan:   Principal Problem:   Weakness Active Problems:   Hypertension   LVH (left ventricular hypertrophy) due to hypertensive disease, without heart failure   S/P placement of cardiac pacemaker   Elevated troponin   Hypertrophic cardiomyopathy ([name])   Anaise Marcy SirenM Wolfer is a 75 y.o. 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 Per patient she states that the nursing home staff thought that she "fell out" which she denies.   Weakness --general deconditioning and reported poor oral intake --PT --return to SNF  Labile Hypertension Documented low blood pressures in cardiology office this year, most of her medications held -Likely need to run it on the high side given history of syncope and near syncope Plan: -Avoid diuretics --cont Lopressor 12.5 mg BID (new) given severe LVH, per cardiology --cont home amlodipine  Elevated troponin levels --Trop 204, in the setting of severe LVH.  No chest pain. --cardiology consulted --Echo --No further evaluation planned  Depression --cont BuSpar   DVT prophylaxis: Lovenox SQ Code Status: Full code  Family Communication:  Level of care: Med-Surg  Dispo:   The patient is from: SNF Anticipated d/c is to: SNF Anticipated d/c date is: Whenever SNF can take pt back Patient currently is medically ready to d/c.   SNF was notified on 4/2 that pt was ready to be discharged, however, SNF had not accepted pt back.   Subjective and Interval History:  Pt and RN reported increased urinary frequency, no dysuria.  UA 2  days ago neg for infection.  Pt has been eating drinking better.     Objective: Vitals:   05/20/20 0848 05/20/20 1010 05/20/20 1138 05/20/20 1307  BP: (!) 155/65 (!) 118/59 111/73 (!) 111/55  Pulse: 74 74 64 72  Resp: 18  17 20   Temp: 98.2 F (36.8 C)  98.2 F (36.8 C) 99 F (37.2 C)  TempSrc: Oral  Oral Oral  SpO2: 97%  97% 98%  Weight:      Height:        Intake/Output Summary (Last 24 hours) at 05/20/2020 1442 Last data filed at 05/20/2020 0900 Gross per 24 hour  Intake 1183 ml  Output --  Net 1183 ml   Filed Weights   05/18/20 2000 05/19/20 0500 05/20/20 0500  Weight: 69.3 kg 69.5 kg 65.5 kg    Examination:   Constitutional: NAD, alert, oriented to person and place HEENT: conjunctivae and lids normal, EOMI CV: No cyanosis.   RESP: normal respiratory effort, on RA Extremities: No effusions, edema in BLE SKIN: warm, dry Neuro: II - XII grossly intact.   Psych: Normal mood and affect.  Appropriate judgement and reason   Data Reviewed: I have personally reviewed following labs and imaging studies  CBC: Recent Labs  Lab 05/18/20 0816 05/20/20 0528  WBC 4.5 5.0  NEUTROABS 2.3  --   HGB 12.0 11.4*  HCT 38.6 36.8  MCV 86.0 86.0  PLT 144* 204   Basic Metabolic Panel: Recent Labs  Lab 05/18/20 0816 05/20/20 0528  NA 140 139  K 3.9 3.7  CL 108 107  CO2  22 25  GLUCOSE 195* 102*  BUN 23 24*  CREATININE 1.06* 0.92  CALCIUM 9.3 9.1  MG  --  2.1   GFR: Estimated Creatinine Clearance: 46.8 mL/min (by C-G formula based on SCr of 0.92 mg/dL). Liver Function Tests: Recent Labs  Lab 05/18/20 0816  AST 24  ALT 13  ALKPHOS 63  BILITOT 1.6*  PROT 7.5  ALBUMIN 3.7   No results for input(s): LIPASE, AMYLASE in the last 168 hours. No results for input(s): AMMONIA in the last 168 hours. Coagulation Profile: No results for input(s): INR, PROTIME in the last 168 hours. Cardiac Enzymes: Recent Labs  Lab 05/18/20 1600  CKTOTAL 65   BNP (last 3  results) No results for input(s): PROBNP in the last 8760 hours. HbA1C: No results for input(s): HGBA1C in the last 72 hours. CBG: Recent Labs  Lab 05/19/20 2105  GLUCAP 162*   Lipid Profile: No results for input(s): CHOL, HDL, LDLCALC, TRIG, CHOLHDL, LDLDIRECT in the last 72 hours. Thyroid Function Tests: No results for input(s): TSH, T4TOTAL, FREET4, T3FREE, THYROIDAB in the last 72 hours. Anemia Panel: No results for input(s): VITAMINB12, FOLATE, FERRITIN, TIBC, IRON, RETICCTPCT in the last 72 hours. Sepsis Labs: Recent Labs  Lab 05/18/20 0945 05/18/20 1246  LATICACIDVEN 1.3 1.1    Recent Results (from the past 240 hour(s))  Resp Panel by RT-PCR (Flu A&B, Covid) Nasopharyngeal Swab     Status: None   Collection Time: 05/18/20 11:02 AM   Specimen: Nasopharyngeal Swab; Nasopharyngeal(NP) swabs in vial transport medium  Result Value Ref Range Status   SARS Coronavirus 2 by RT PCR NEGATIVE NEGATIVE Final    Comment: (NOTE) SARS-CoV-2 target nucleic acids are NOT DETECTED.  The SARS-CoV-2 RNA is generally detectable in upper respiratory specimens during the acute phase of infection. The lowest concentration of SARS-CoV-2 viral copies this assay can detect is 138 copies/mL. A negative result does not preclude SARS-Cov-2 infection and should not be used as the sole basis for treatment or other patient management decisions. A negative result may occur with  improper specimen collection/handling, submission of specimen other than nasopharyngeal swab, presence of viral mutation(s) within the areas targeted by this assay, and inadequate number of viral copies(<138 copies/mL). A negative result must be combined with clinical observations, patient history, and epidemiological information. The expected result is Negative.  Fact Sheet for Patients:  BloggerCourse.com  Fact Sheet for Healthcare Providers:  SeriousBroker.it  This  test is no t yet approved or cleared by the [place] FDA and  has been authorized for detection and/or diagnosis of SARS-CoV-2 by FDA under an Emergency Use Authorization (EUA). This EUA will remain  in effect (meaning this test can be used) for the duration of the COVID-19 declaration under Section 564(b)(1) of the Act, 21 U.S.C.section 360bbb-3(b)(1), unless the authorization is terminated  or revoked sooner.       Influenza A by PCR NEGATIVE NEGATIVE Final   Influenza B by PCR NEGATIVE NEGATIVE Final    Comment: (NOTE) The Xpert Xpress SARS-CoV-2/FLU/RSV plus assay is intended as an aid in the diagnosis of influenza from Nasopharyngeal swab specimens and should not be used as a sole basis for treatment. Nasal washings and aspirates are unacceptable for Xpert Xpress SARS-CoV-2/FLU/RSV testing.  Fact Sheet for Patients: BloggerCourse.com  Fact Sheet for Healthcare Providers: SeriousBroker.it  This test is not yet approved or cleared by the [place] FDA and has been authorized for detection and/or diagnosis of SARS-CoV-2 by FDA under an  Emergency Use Authorization (EUA). This EUA will remain in effect (meaning this test can be used) for the duration of the COVID-19 declaration under Section 564(b)(1) of the Act, 21 U.S.C. section 360bbb-3(b)(1), unless the authorization is terminated or revoked.  Performed at [facility], [address] [place], [place] [address]   MRSA PCR Screening     Status: None   Collection Time: 05/20/20 12:22 AM   Specimen: Nasopharyngeal  Result Value Ref Range Status   MRSA by PCR NEGATIVE NEGATIVE Final    Comment:        The GeneXpert MRSA Assay (FDA approved for NASAL specimens only), is one component of a comprehensive MRSA colonization surveillance program. It is not intended to diagnose MRSA infection nor to guide or monitor treatment for MRSA  infections. Performed at [facility], [address] [place], [place] [address]       Radiology Studies: ECHOCARDIOGRAM COMPLETE  Result Date: 05/19/2020    ECHOCARDIOGRAM REPORT   Patient Name:   CHARLANN WAYNE Date of Exam: 05/19/2020 Medical Rec #:  [id number]       Height:       65.0 in Accession #:    [id number]      Weight:       153.2 lb Date of Birth:  1946-01-21       BSA:          1.766 m Patient Age:    74 years        BP:           176/76 mmHg Patient Gender: F               HR:           79 bpm. Exam Location:  [facility] Procedure: 2D Echo, Cardiac Doppler and Color Doppler Indications:    Chest Pain R07.9  History:        Patient has prior history of Echocardiogram examinations. CAD.                 [name].  Sonographer:    Neysa Bonito Roar Referring Phys: [id number] AGBATA Diagnosing      Julien Nordmann MD Phys: IMPRESSIONS  1. Left ventricular ejection fraction, by estimation, is 60 to 65%. The left ventricle has normal function. The left ventricle has no regional wall motion abnormalities. There is severe concentric left ventricular hypertrophy, notably around the mid to distal LV walls and apical region. Left ventricular diastolic parameters are consistent with Grade I diastolic dysfunction (impaired relaxation).  2. Right ventricular systolic function is normal. The right ventricular size is normal. There is normal pulmonary artery systolic pressure. The estimated right ventricular systolic pressure is 21.8 mmHg.  3. Left atrial size was mildly dilated. FINDINGS  Left Ventricle: Left ventricular ejection fraction, by estimation, is 60 to 65%. The left ventricle has normal function. The left ventricle has no regional wall motion abnormalities. The left ventricular internal cavity size was normal in size. There is  severe concentric left ventricular hypertrophy. Left ventricular diastolic parameters are consistent with Grade I diastolic dysfunction (impaired relaxation). Right Ventricle:  The right ventricular size is normal. No increase in right ventricular wall thickness. Right ventricular systolic function is normal. There is normal pulmonary artery systolic pressure. The tricuspid regurgitant velocity is 2.05 m/s, and  with an assumed right atrial pressure of 5 mmHg, the estimated right ventricular systolic pressure is 21.8 mmHg. Left Atrium: Left atrial size was mildly dilated. Right Atrium:  Right atrial size was normal in size. Pericardium: There is no evidence of pericardial effusion. Mitral Valve: The mitral valve is normal in structure. There is mild thickening of the mitral valve leaflet(s). Mild mitral annular calcification. No evidence of mitral valve regurgitation. No evidence of mitral valve stenosis. Tricuspid Valve: The tricuspid valve is normal in structure. Tricuspid valve regurgitation is mild . No evidence of tricuspid stenosis. Aortic Valve: The aortic valve is normal in structure. Aortic valve regurgitation is not visualized. Mild aortic valve sclerosis is present, with no evidence of aortic valve stenosis. Aortic valve peak gradient measures 9.4 mmHg. Pulmonic Valve: The pulmonic valve was normal in structure. Pulmonic valve regurgitation is not visualized. No evidence of pulmonic stenosis. Aorta: The aortic root is normal in size and structure. Venous: The inferior vena cava is normal in size with greater than 50% respiratory variability, suggesting right atrial pressure of 3 mmHg. IAS/Shunts: No atrial level shunt detected by color flow Doppler.  LEFT VENTRICLE PLAX 2D LVIDd:         3.84 cm  Diastology LVIDs:         2.67 cm  LV e' medial:    4.68 cm/s LV PW:         1.22 cm  LV E/e' medial:  13.6 LV IVS:        1.55 cm  LV e' lateral:   7.29 cm/s LVOT diam:     1.80 cm  LV E/e' lateral: 8.8 LVOT Area:     2.54 cm  RIGHT VENTRICLE RV Mid diam:    2.93 cm LEFT ATRIUM             Index       RIGHT ATRIUM           Index LA diam:        4.00 cm 2.26 cm/m  RA Area:     14.40  cm LA Vol (A2C):   44.8 ml 25.36 ml/m RA Volume:   37.20 ml  21.06 ml/m LA Vol (A4C):   54.5 ml 30.85 ml/m LA Biplane Vol: 49.7 ml 28.14 ml/m  AORTIC VALVE                PULMONIC VALVE AV Area (Vmax): 1.96 cm    PV Vmax:        1.32 m/s AV Vmax:        153.00 cm/s PV Peak grad:   7.0 mmHg AV Peak Grad:   9.4 mmHg    RVOT Peak grad: 3 mmHg LVOT Vmax:      118.00 cm/s  AORTA Ao Root diam: 2.40 cm MITRAL VALVE                TRICUSPID VALVE MV Area (PHT): 3.89 cm     TR Peak grad:   16.8 mmHg MV Decel Time: 195 msec     TR Vmax:        205.00 cm/s MV E velocity: 63.80 cm/s MV A velocity: 101.00 cm/s  SHUNTS MV E/A ratio:  0.63         Systemic Diam: 1.80 cm MV A Prime:    9.2 cm/s Julien Nordmann MD Electronically signed by Julien Nordmann MD Signature Date/Time: 05/19/2020/2:54:06 PM    Final      Scheduled Meds: . amLODipine  5 mg Oral Daily  . aspirin EC  81 mg Oral Daily  . busPIRone  7.5 mg Oral Daily  . enoxaparin (LOVENOX) injection  40 mg  Subcutaneous Q24H  . metoprolol tartrate  12.5 mg Oral BID  . rosuvastatin  40 mg Oral Daily  . sodium chloride flush  3 mL Intravenous Q12H   Continuous Infusions: . sodium chloride       LOS: 0 days     Darlin Priestly, MD [facility] If 7PM-7AM, please contact night-coverage 05/20/2020, 2:42 PM

## 2020-05-20 NOTE — TOC Progression Note (Signed)
Transition of Care [facility]) - Progression Note    Patient Details  Name: Shannon Chen MRN: [id number] Date of Birth: [birth year]  Transition of Care [facility]) CM/SW Contact  Bing Quarry, RN Phone Number: 05/20/2020, 1:52 PM  Clinical Narrative:  4/3 Still no call from facility. New PT evaluation. Moved to OBS 116 pending transfer back to facility per Unit RN notes. Gabriel Cirri RN CM          Expected Discharge Plan and Services                                                 Social Determinants of Health (SDOH) Interventions    Readmission Risk Interventions No flowsheet data found.

## 2020-05-21 ENCOUNTER — Telehealth: Payer: Self-pay | Admitting: Internal Medicine

## 2020-05-21 DIAGNOSIS — R531 Weakness: Secondary | ICD-10-CM | POA: Diagnosis not present

## 2020-05-21 LAB — BASIC METABOLIC PANEL
Anion gap: 5 (ref 5–15)
BUN: 23 mg/dL (ref 8–23)
CO2: 26 mmol/L (ref 22–32)
Calcium: 9 mg/dL (ref 8.9–10.3)
Chloride: 109 mmol/L (ref 98–111)
Creatinine, Ser: 0.94 mg/dL (ref 0.44–1.00)
GFR, Estimated: >60 mL/min (ref >60)
Glucose, Bld: 110 mg/dL — ABNORMAL HIGH (ref 70–99)
Potassium: 3.5 mmol/L (ref 3.5–5.1)
Sodium: 140 mmol/L (ref 135–145)

## 2020-05-21 LAB — MAGNESIUM: Magnesium: 2.2 mg/dL (ref 1.7–2.4)

## 2020-05-21 LAB — CBC
HCT: 35.1 % — ABNORMAL LOW (ref 36.0–46.0)
Hemoglobin: 11.4 g/dL — ABNORMAL LOW (ref 12.0–15.0)
MCH: 27.1 pg (ref 26.0–34.0)
MCHC: 32.5 g/dL (ref 30.0–36.0)
MCV: 83.6 fL (ref 80.0–100.0)
Platelets: 177 10*3/uL (ref 150–400)
RBC: 4.2 MIL/uL (ref 3.87–5.11)
RDW: 14.4 % (ref 11.5–15.5)
WBC: 4.6 10*3/uL (ref 4.0–10.5)
nRBC: 0 % (ref 0.0–0.2)

## 2020-05-21 MED ORDER — METOPROLOL TARTRATE 25 MG PO TABS: 12.5000 mg | ORAL_TABLET | Freq: Two times a day (BID) | ORAL | Status: DC

## 2020-05-21 MED ORDER — METOPROLOL TARTRATE 25 MG PO TABS: 12.5000 mg | ORAL_TABLET | Freq: Two times a day (BID) | ORAL | 2 refills | Status: DC

## 2020-05-21 NOTE — Discharge Summary (Signed)
Physician Discharge Summary   Shannon Chen  female DOB: 02/14/1946  WJX:[id number]  PCP: Franciso Bendogers, Jennifer B, NP  Admit date: 05/18/2020 Discharge date: 05/21/2020  Admitted From: group home Disposition:  Group home Home Health: Yes CODE STATUS: Full code   Hospital Course:  For full details, please see H&P, progress notes, consult notes and ancillary notes.  Briefly,  Shannon Chen a 75 y.o.femalewith medical history significant formild cognitive dysfunction, pacemaker insertion for Mobitz type II heart block, history of HOCMand hypertension who was sent to the emergency room from group home for evaluation of generalized weakness and feeling unwell for a couple of days. Per patient she stated that the home staff thought that she"fell out"which she denies.   Weakness --general deconditioning and reported poor oral intake.  Oral intake appeared good during hospitalization. --PT rec home with HHPT.  Labile Hypertension Documented low blood pressures in cardiology office this year, most of her medications held. -Likely need to run it on the high side given history of syncope and near syncope.   Cardiology consulted, and started pt on Lopressor 12.5 mg BID given severe LVH. --cont home amlodipine  Elevated troponin levels --Trop 204, in the setting of severe LVH.  No chest pain.  Echo showed normal LVEF, grade I diastolic dysfunction.   --cardiology consulted and recommended no further evaluation.  Depression --cont BuSpar   Discharge Diagnoses:  Principal Problem:   Weakness Active Problems:   Hypertension   LVH (left ventricular hypertrophy) due to hypertensive disease, without heart failure   S/P placement of cardiac pacemaker   Elevated troponin   Hypertrophic cardiomyopathy [facility]([name])     Discharge Instructions:  Allergies as of 05/21/2020   No Known Allergies     Medication List    STOP taking these medications   nitrofurantoin 100 MG  capsule Commonly known as: MACRODANTIN     TAKE these medications   acetaminophen 325 MG tablet Commonly known as: TYLENOL Take 650 mg by mouth every 4 (four) hours as needed.   amLODipine 5 MG tablet Commonly known as: NORVASC Take 1 tablet (5 mg total) by mouth daily as needed. Take for BP >150 systolic What changed:   when to take this  additional instructions   aspirin 81 MG EC tablet Take 1 tablet (81 mg total) by mouth daily. Swallow whole.   busPIRone 7.5 MG tablet Commonly known as: BUSPAR Take 7.5 mg by mouth daily.   loperamide 2 MG capsule Commonly known as: IMODIUM Take 2 mg by mouth as needed.   metoprolol tartrate 25 MG tablet Commonly known as: LOPRESSOR Take 0.5 tablets (12.5 mg total) by mouth 2 (two) times daily.   polyethylene glycol 17 g packet Commonly known as: MiraLax Take 17 g by mouth daily as needed for mild constipation.   rosuvastatin 40 MG tablet Commonly known as: CRESTOR Take 1 tablet (40 mg total) by mouth daily.   traMADol 50 MG tablet Commonly known as: ULTRAM Take 50 mg by mouth every 6 (six) hours as needed.        Follow-up Information    Franciso Bendogers, Jennifer B, NP. Schedule an appointment as soon as possible for a visit on 05/28/2020.   Specialty: Nurse Practitioner Why: at 9:45am Contact information: [address] [place] [address] [phone]               No Known Allergies   The results of significant diagnostics from this hospitalization (including imaging, microbiology, ancillary and laboratory)  are listed below for reference.   Consultations:   Procedures/Studies: DG Chest Portable 1 View  Result Date: 05/18/2020 CLINICAL DATA:  Weakness and decreased appetite EXAM: PORTABLE CHEST 1 VIEW COMPARISON:  November 16, 2019 FINDINGS: Lungs are clear. Heart is upper normal in size with pulmonary vascularity are normal. Pacemaker leads are attached to the right heart. There is aortic atherosclerosis.  No adenopathy. No bone lesions. IMPRESSION: Lungs clear. Heart upper normal in size. Pacemaker lead positions unchanged. Aortic Atherosclerosis (ICD10-I70.0). Electronically Signed   By: Bretta Bang III M.D.   On: 05/18/2020 08:25   ECHOCARDIOGRAM COMPLETE  Result Date: 05/19/2020    ECHOCARDIOGRAM REPORT   Patient Name:   Shannon Chen Date of Exam: 05/19/2020 Medical Rec #:  [id number]       Height:       65.0 in Accession #:    [id number]      Weight:       153.2 lb Date of Birth:  December 14, 1945       BSA:          1.766 m Patient Age:    74 years        BP:           176/76 mmHg Patient Gender: F               HR:           79 bpm. Exam Location:  [facility] Procedure: 2D Echo, Cardiac Doppler and Color Doppler Indications:    Chest Pain R07.9  History:        Patient has prior history of Echocardiogram examinations. CAD.                 [name].  Sonographer:    Neysa Bonito Roar Referring Phys: [id number] AGBATA Diagnosing      Julien Nordmann MD Phys: IMPRESSIONS  1. Left ventricular ejection fraction, by estimation, is 60 to 65%. The left ventricle has normal function. The left ventricle has no regional wall motion abnormalities. There is severe concentric left ventricular hypertrophy, notably around the mid to distal LV walls and apical region. Left ventricular diastolic parameters are consistent with Grade I diastolic dysfunction (impaired relaxation).  2. Right ventricular systolic function is normal. The right ventricular size is normal. There is normal pulmonary artery systolic pressure. The estimated right ventricular systolic pressure is 21.8 mmHg.  3. Left atrial size was mildly dilated. FINDINGS  Left Ventricle: Left ventricular ejection fraction, by estimation, is 60 to 65%. The left ventricle has normal function. The left ventricle has no regional wall motion abnormalities. The left ventricular internal cavity size was normal in size. There is  severe concentric left ventricular hypertrophy. Left  ventricular diastolic parameters are consistent with Grade I diastolic dysfunction (impaired relaxation). Right Ventricle: The right ventricular size is normal. No increase in right ventricular wall thickness. Right ventricular systolic function is normal. There is normal pulmonary artery systolic pressure. The tricuspid regurgitant velocity is 2.05 m/s, and  with an assumed right atrial pressure of 5 mmHg, the estimated right ventricular systolic pressure is 21.8 mmHg. Left Atrium: Left atrial size was mildly dilated. Right Atrium: Right atrial size was normal in size. Pericardium: There is no evidence of pericardial effusion. Mitral Valve: The mitral valve is normal in structure. There is mild thickening of the mitral valve leaflet(s). Mild mitral annular calcification. No evidence of mitral valve regurgitation. No evidence of mitral valve stenosis. Tricuspid Valve: The tricuspid valve is normal  in structure. Tricuspid valve regurgitation is mild . No evidence of tricuspid stenosis. Aortic Valve: The aortic valve is normal in structure. Aortic valve regurgitation is not visualized. Mild aortic valve sclerosis is present, with no evidence of aortic valve stenosis. Aortic valve peak gradient measures 9.4 mmHg. Pulmonic Valve: The pulmonic valve was normal in structure. Pulmonic valve regurgitation is not visualized. No evidence of pulmonic stenosis. Aorta: The aortic root is normal in size and structure. Venous: The inferior vena cava is normal in size with greater than 50% respiratory variability, suggesting right atrial pressure of 3 mmHg. IAS/Shunts: No atrial level shunt detected by color flow Doppler.  LEFT VENTRICLE PLAX 2D LVIDd:         3.84 cm  Diastology LVIDs:         2.67 cm  LV e' medial:    4.68 cm/s LV PW:         1.22 cm  LV E/e' medial:  13.6 LV IVS:        1.55 cm  LV e' lateral:   7.29 cm/s LVOT diam:     1.80 cm  LV E/e' lateral: 8.8 LVOT Area:     2.54 cm  RIGHT VENTRICLE RV Mid diam:    2.93  cm LEFT ATRIUM             Index       RIGHT ATRIUM           Index LA diam:        4.00 cm 2.26 cm/m  RA Area:     14.40 cm LA Vol (A2C):   44.8 ml 25.36 ml/m RA Volume:   37.20 ml  21.06 ml/m LA Vol (A4C):   54.5 ml 30.85 ml/m LA Biplane Vol: 49.7 ml 28.14 ml/m  AORTIC VALVE                PULMONIC VALVE AV Area (Vmax): 1.96 cm    PV Vmax:        1.32 m/s AV Vmax:        153.00 cm/s PV Peak grad:   7.0 mmHg AV Peak Grad:   9.4 mmHg    RVOT Peak grad: 3 mmHg LVOT Vmax:      118.00 cm/s  AORTA Ao Root diam: 2.40 cm MITRAL VALVE                TRICUSPID VALVE MV Area (PHT): 3.89 cm     TR Peak grad:   16.8 mmHg MV Decel Time: 195 msec     TR Vmax:        205.00 cm/s MV E velocity: 63.80 cm/s MV A velocity: 101.00 cm/s  SHUNTS MV E/A ratio:  0.63         Systemic Diam: 1.80 cm MV A Prime:    9.2 cm/s Julien Nordmann MD Electronically signed by Julien Nordmann MD Signature Date/Time: 05/19/2020/2:54:06 PM    Final       Labs: BNP (last 3 results) No results for input(s): BNP in the last 8760 hours. Basic Metabolic Panel: Recent Labs  Lab 05/18/20 0816 05/20/20 0528 05/21/20 0714  NA 140 139 140  K 3.9 3.7 3.5  CL 108 107 109  CO2 22 25 26   GLUCOSE 195* 102* 110*  BUN 23 24* 23  CREATININE 1.06* 0.92 0.94  CALCIUM 9.3 9.1 9.0  MG  --  2.1 2.2   Liver Function Tests: Recent Labs  Lab 05/18/20 0816  AST 24  ALT 13  ALKPHOS 63  BILITOT 1.6*  PROT 7.5  ALBUMIN 3.7   No results for input(s): LIPASE, AMYLASE in the last 168 hours. No results for input(s): AMMONIA in the last 168 hours. CBC: Recent Labs  Lab 05/18/20 0816 05/20/20 0528 05/21/20 0714  WBC 4.5 5.0 4.6  NEUTROABS 2.3  --   --   HGB 12.0 11.4* 11.4*  HCT 38.6 36.8 35.1*  MCV 86.0 86.0 83.6  PLT 144* 204 177   Cardiac Enzymes: Recent Labs  Lab 05/18/20 1600  CKTOTAL 65   BNP: Invalid input(s): POCBNP CBG: Recent Labs  Lab 05/19/20 2105  GLUCAP 162*   D-Dimer No results for input(s): DDIMER in the  last 72 hours. Hgb A1c No results for input(s): HGBA1C in the last 72 hours. Lipid Profile No results for input(s): CHOL, HDL, LDLCALC, TRIG, CHOLHDL, LDLDIRECT in the last 72 hours. Thyroid function studies No results for input(s): TSH, T4TOTAL, T3FREE, THYROIDAB in the last 72 hours.  Invalid input(s): FREET3 Anemia work up No results for input(s): VITAMINB12, FOLATE, FERRITIN, TIBC, IRON, RETICCTPCT in the last 72 hours. Urinalysis    Component Value Date/Time   COLORURINE YELLOW (A) 05/18/2020 1000   APPEARANCEUR HAZY (A) 05/18/2020 1000   LABSPEC 1.015 05/18/2020 1000   PHURINE 6.0 05/18/2020 1000   GLUCOSEU 150 (A) 05/18/2020 1000   HGBUR NEGATIVE 05/18/2020 1000   BILIRUBINUR NEGATIVE 05/18/2020 1000   KETONESUR NEGATIVE 05/18/2020 1000   PROTEINUR 100 (A) 05/18/2020 1000   NITRITE NEGATIVE 05/18/2020 1000   LEUKOCYTESUR NEGATIVE 05/18/2020 1000   Sepsis Labs Invalid input(s): PROCALCITONIN,  WBC,  LACTICIDVEN Microbiology Recent Results (from the past 240 hour(s))  Resp Panel by RT-PCR (Flu A&B, Covid) Nasopharyngeal Swab     Status: None   Collection Time: 05/18/20 11:02 AM   Specimen: Nasopharyngeal Swab; Nasopharyngeal(NP) swabs in vial transport medium  Result Value Ref Range Status   SARS Coronavirus 2 by RT PCR NEGATIVE NEGATIVE Final    Comment: (NOTE) SARS-CoV-2 target nucleic acids are NOT DETECTED.  The SARS-CoV-2 RNA is generally detectable in upper respiratory specimens during the acute phase of infection. The lowest concentration of SARS-CoV-2 viral copies this assay can detect is 138 copies/mL. A negative result does not preclude SARS-Cov-2 infection and should not be used as the sole basis for treatment or other patient management decisions. A negative result may occur with  improper specimen collection/handling, submission of specimen other than nasopharyngeal swab, presence of viral mutation(s) within the areas targeted by this assay, and  inadequate number of viral copies(<138 copies/mL). A negative result must be combined with clinical observations, patient history, and epidemiological information. The expected result is Negative.  Fact Sheet for Patients:  BloggerCourse.com  Fact Sheet for Healthcare Providers:  SeriousBroker.it  This test is no t yet approved or cleared by the [place] FDA and  has been authorized for detection and/or diagnosis of SARS-CoV-2 by FDA under an Emergency Use Authorization (EUA). This EUA will remain  in effect (meaning this test can be used) for the duration of the COVID-19 declaration under Section 564(b)(1) of the Act, 21 U.S.C.section 360bbb-3(b)(1), unless the authorization is terminated  or revoked sooner.       Influenza A by PCR NEGATIVE NEGATIVE Final   Influenza B by PCR NEGATIVE NEGATIVE Final    Comment: (NOTE) The Xpert Xpress SARS-CoV-2/FLU/RSV plus assay is intended as an aid in the diagnosis of influenza from Nasopharyngeal swab specimens and should not be used  as a sole basis for treatment. Nasal washings and aspirates are unacceptable for Xpert Xpress SARS-CoV-2/FLU/RSV testing.  Fact Sheet for Patients: BloggerCourse.com  Fact Sheet for Healthcare Providers: SeriousBroker.it  This test is not yet approved or cleared by the [place] FDA and has been authorized for detection and/or diagnosis of SARS-CoV-2 by FDA under an Emergency Use Authorization (EUA). This EUA will remain in effect (meaning this test can be used) for the duration of the COVID-19 declaration under Section 564(b)(1) of the Act, 21 U.S.C. section 360bbb-3(b)(1), unless the authorization is terminated or revoked.  Performed at [facility] [address] [place], [place] [address]   MRSA PCR Screening     Status: None   Collection Time: 05/20/20 12:22 AM   Specimen:  Nasopharyngeal  Result Value Ref Range Status   MRSA by PCR NEGATIVE NEGATIVE Final    Comment:        The GeneXpert MRSA Assay (FDA approved for NASAL specimens only), is one component of a comprehensive MRSA colonization surveillance program. It is not intended to diagnose MRSA infection nor to guide or monitor treatment for MRSA infections. Performed at [facility], [address] [place], [place] [address]      Total time spend on discharging this patient, including the last patient exam, discussing the hospital stay, instructions for ongoing care as it relates to all pertinent caregivers, as well as preparing the medical discharge records, prescriptions, and/or referrals as applicable, is 30 minutes.    Darlin Priestly, MD  [facility] 05/21/2020, 10:38 AM

## 2020-05-21 NOTE — Telephone Encounter (Signed)
Per [facility] schedule fu with [name] and [name].    Patient scheduled Shannon Chen next week but next available in July with [name].  Please advise if needing sooner  / overbook with [facility].
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
## 2020-05-21 NOTE — TOC Transition Note (Signed)
Transition of Care [facility]) - CM/SW Discharge Note   Patient Details  Name: Shannon Chen MRN: [id number] Date of Birth: [birth year]  [facility] CM/SW Contact:  Allayne Butcher, RN Phone Number: 05/21/2020, 9:39 AM   Clinical Narrative:    Patient is medically ready for discharge back to [facility] mental health home.  PT is recommending home health.  [facility] has accepted referral for PT and OT.  Cherry from the group home will pick patient up today.     Final next level of care: Group Home Barriers to Discharge: Barriers Resolved   Patient Goals and CMS Choice        Discharge Placement              Patient chooses bed at:  ([facility]) Patient to be transferred to facility by: Cherry picking patient up   Patient and family notified of of transfer: 05/21/20  Discharge Plan and Services                DME Arranged: N/A         HH Arranged: PT,OT HH Agency: [facility] Date HH Agency Contacted: 05/21/20 Time HH Agency Contacted: [phone] Representative spoke with at [facility] Agency: Janace Aris  Social Determinants of Health (SDOH) Interventions     Readmission Risk Interventions No flowsheet data found.

## 2020-05-21 NOTE — Telephone Encounter (Signed)
Dr. [name], please review and advise if/ when EP follow up would be needed post hospital.  Thank you!

## 2020-05-21 NOTE — Progress Notes (Signed)
Received Md order to discharge patient back to group home, reviewed homes meds, prescriptions, discharge instructions and follow up appointments with patient and patient verbalized understanding.

## 2020-05-22 NOTE — Telephone Encounter (Signed)
Reviewed the timing of the patient's EP follow up with Dr. [name]. Per Dr. [name], no EP follow up needed except as scheduled.  He is currently scheduled to see the patient in July 2022. We will keep this appointment as scheduled.
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
## 2020-05-31 ENCOUNTER — Other Ambulatory Visit: Payer: Self-pay

## 2020-05-31 ENCOUNTER — Ambulatory Visit (INDEPENDENT_AMBULATORY_CARE_PROVIDER_SITE_OTHER): Payer: Medicare HMO | Admitting: Nurse Practitioner

## 2020-05-31 ENCOUNTER — Encounter: Payer: Self-pay | Admitting: Nurse Practitioner

## 2020-05-31 VITALS — BP 110/60 | HR 70 | Ht 65.5 in | Wt 145.0 lb

## 2020-05-31 DIAGNOSIS — I6521 Occlusion and stenosis of right carotid artery: Secondary | ICD-10-CM

## 2020-05-31 DIAGNOSIS — R778 Other specified abnormalities of plasma proteins: Secondary | ICD-10-CM | POA: Diagnosis not present

## 2020-05-31 DIAGNOSIS — I422 Other hypertrophic cardiomyopathy: Secondary | ICD-10-CM

## 2020-05-31 DIAGNOSIS — I951 Orthostatic hypotension: Secondary | ICD-10-CM | POA: Diagnosis not present

## 2020-05-31 DIAGNOSIS — I441 Atrioventricular block, second degree: Secondary | ICD-10-CM | POA: Diagnosis not present

## 2020-05-31 NOTE — Progress Notes (Signed)
Office Visit    Patient Name: Shannon Chen Date of Encounter: 05/31/2020  Primary Care Provider:  Franciso Bend, NP Primary Cardiologist:  Julien Nordmann, MD  Chief Complaint    75 year old female with a history of labile hypertension, hypertrophic cardiomyopathy/severe LVH with normal EF, carotid stenosis status post right CEA, syncope, heart block status post permanent pacemaker in August 2018, and remote tobacco abuse, who presents for follow-up after recent hospitalization for weakness.  Past Medical History    Past Medical History:  Diagnosis Date  . Arthritis   . AV block, Mobitz II    a. 09/2016 s/p MDT [id number] Azure XT DR MRI DC PPM  . Carotid artery disease ([name])    a. 09/2016 Carotid U/S: RICA 70-99%, LICA 50-69%;  b. 09/2016 CTA Neck: RICA 60, RCCA 30, LICA 70%, L Vert 60, L Basilar 70; c. 10/2019 s/p R CEA.  . Essential hypertension   . History of stress test    a. 12/2016 MV: EF 51%, no ischemia/infarct; b. 10/2019 MV: EF 61%, no ischemia/infarct.  . Hyperlipidemia   . Hypertrophic cardiomyopathy ([name])    a. 09/2016 Echo: EF 60-65%, no rwma, Gr1 DD, Ca2+ MV annulus; b. 11/2017 Echo: EF 70-75%, no rwma, Gr1 DD, mild AS vs dynamic LVOT obs. Nl RV fxn. Hyperdynamic LVEF w/ sev LVH; c. 10/2019 Echo: EF 70-75%, no rwma, gr1 DD, sev LVH, Gr1 DD, nl PASP, triv MR, mild-mod Ao sclerosis; c. 05/2020 Echo: EF 60-65%, no rwma, sev conc LVH, gr1 DD, nl RV fxn. RVSP 21.8mmHg. Mildly dil LA.  . Presence of permanent cardiac pacemaker    Past Surgical History:  Procedure Laterality Date  . ABDOMINAL HYSTERECTOMY    . ENDARTERECTOMY Right 11/16/2019   Procedure: ENDARTERECTOMY CAROTID;  Surgeon: Renford Dills, MD;  Location: [facility] ORS;  Service: Vascular;  Laterality: Right;  . PACEMAKER IMPLANT N/A 10/08/2016   Procedure: Pacemaker Implant;  Surgeon: Marinus Maw, MD;  Location: [facility] INVASIVE CV LAB;  Service: Cardiovascular;  Laterality: N/A;    Allergies  No Known  Allergies  History of Present Illness    75 year old female with above past medical history including labile hypertension, hypertrophic cardiomyopathy/severe LVH with normal EF, carotid stenosis status post right CEA, syncope, heart block status post permanent pacemaker placement in August 2018, and remote tobacco abuse.  She lives at a skilled nursing facility and was admitted to [facility] earlier this month in the setting of generalized weakness without presyncope or syncope.  She was found to be orthostatic on examination.  High-sensitivity troponin was elevated to a peak of 204.  Echocardiogram was performed and showed normal LV function with an EF of 60-65% with severe concentric LVH and grade 1 diastolic dysfunction.  She was not experiencing any chest pain or dyspnea.  It was felt that troponin abnormality was most likely secondary to severe LVH and conservative therapy was recommended.  Since discharge, she reports feeling well and improving in strength.  She will begin physical therapy on Monday.  She denies chest pain, dyspnea, palpitations, PND, orthopnea, dizziness, syncope, edema, or early satiety.  Home Medications    Prior to Admission medications   Medication Sig Start Date End Date Taking? Authorizing Provider  acetaminophen (TYLENOL) 325 MG tablet Take 650 mg by mouth every 4 (four) hours as needed.   Yes [provider]  amLODipine (NORVASC) 5 MG tablet Take 1 tablet (5 mg total) by mouth daily as needed. Take for BP >150  systolic Patient taking differently: Take 5 mg by mouth daily. Use only if BP >150 systolic 04/11/20 2/99/24 Yes Gollan, Tollie Pizza, MD  aspirin EC 81 MG EC tablet Take 1 tablet (81 mg total) by mouth daily. Swallow whole. 11/09/19  Yes Wouk, Wilfred Curtis, MD  busPIRone (BUSPAR) 7.5 MG tablet Take 7.5 mg by mouth daily.  09/07/19  Yes [provider]  dapagliflozin propanediol (FARXIGA) 10 MG TABS tablet Take 10 mg by mouth daily.   Yes  [provider]  loperamide (IMODIUM) 2 MG capsule Take 2 mg by mouth as needed. 12/07/19  Yes [provider]  nitrofurantoin (MACRODANTIN) 100 MG capsule Take 100 mg by mouth 4 (four) times daily.   Yes [provider]  polyethylene glycol (MIRALAX) 17 g packet Take 17 g by mouth daily as needed for mild constipation. 11/28/19  Yes Alver Sorrow, NP  rosuvastatin (CRESTOR) 40 MG tablet Take 1 tablet (40 mg total) by mouth daily. 10/19/19  Yes Gollan, Tollie Pizza, MD  traMADol (ULTRAM) 50 MG tablet Take 50 mg by mouth every 6 (six) hours as needed. 04/23/20  Yes [provider]    Review of Systems    Improved strength since discharge.  She denies chest pain, palpitations, dyspnea, pnd, orthopnea, n, v, dizziness, syncope, edema, weight gain, or early satiety.  All other systems reviewed and are otherwise negative except as noted above.  Physical Exam    VS:  BP 110/60 (BP Location: Right Arm, Patient Position: Sitting, Cuff Size: Normal)   Pulse 70   Ht 5' 5.5" (1.664 m)   Wt 145 lb (65.8 kg)   SpO2 97%   BMI 23.76 kg/m  , BMI Body mass index is 23.76 kg/m. GEN: Well nourished, well developed, in no acute distress. HEENT: normal. Neck: Supple, no JVD, carotid bruits, or masses. Cardiac: RRR, 2/6 systolic murmur heard throughout, no rubs, or gallops. No clubbing, cyanosis, edema.  Radials/PT 2+ and equal bilaterally.  Respiratory:  Respirations regular and unlabored, clear to auscultation bilaterally. GI: Soft, nontender, nondistended, BS + x 4. MS: no deformity or atrophy. Skin: warm and dry, no rash. Neuro:  Strength and sensation are intact. Psych: Flat affect.  Accessory Clinical Findings    ECG personally reviewed by me today -regular sinus rhythm, 70, right bundle branch block with anterolateral T wave inversion- no acute changes.  Lab Results  Component Value Date   WBC 4.6 05/21/2020   HGB 11.4 (L) 05/21/2020   HCT 35.1 (L)  05/21/2020   MCV 83.6 05/21/2020   PLT 177 05/21/2020   Lab Results  Component Value Date   CREATININE 0.94 05/21/2020   BUN 23 05/21/2020   NA 140 05/21/2020   K 3.5 05/21/2020   CL 109 05/21/2020   CO2 26 05/21/2020   Lab Results  Component Value Date   ALT 13 05/18/2020   AST 24 05/18/2020   ALKPHOS 63 05/18/2020   BILITOT 1.6 (H) 05/18/2020   Lab Results  Component Value Date   CHOL 275 (H) 10/03/2016   HDL 54 10/03/2016   LDLCALC 185 (H) 10/03/2016   TRIG 178 (H) 10/03/2016   CHOLHDL 5.1 10/03/2016    Lab Results  Component Value Date   HGBA1C 5.8 (H) 10/03/2016    Assessment & Plan    1.  Elevated high-sensitivity troponin: Patient recently admitted to [facility] with generalized weakness and finding of orthostasis.  High-sensitivity troponin was also noted to be elevated to a peak  of 204.  She was not having any chest pain or dyspnea.  Echocardiogram showed normal LV function with severe concentric LVH.  She was evaluated by Dr. [name].  Troponin elevation felt to be secondary to LVH.  Medical therapy recommended.  Since discharge, she has not had any chest pain or dyspnea.  Her strength is improving and she begins physical therapy on Monday.  No further ischemic evaluation planned at this time.  She remains on aspirin therapy.  2.  Orthostatic hypotension: Patient previously on amlodipine but this is now ordered to be used only as needed for systolic pressures greater than 150.  Blood pressure stable though low today at 100/60.  I did review her medicine list from the nursing home and confirm that she is not really receiving amlodipine at this time.  3.  Hypertrophic cardiomyopathy/severe left ventricular hypertrophy: Asymptomatic.  Blood pressure too soft for medical therapy.  4.  Carotid arterial disease: Status post right CEA.  1 to 39% bilateral ICA stenoses noted by ultrasound January 2022.  Followed by vascular surgery.  She remains on aspirin and statin  therapy.  5.  Hyperlipidemia: This is been followed by primary care.  Remains on rosuvastatin therapy.  6.  Second-degree AV block status post permanent pacemaker: Followed by electrophysiology.  7.  Disposition: Follow-up in clinic in 6 months or sooner if necessary.   Nicolasa Ducking, NP 05/31/2020, 4:49 PM

## 2020-05-31 NOTE — Patient Instructions (Signed)
Medication Instructions:  No changes  *If you need a refill on your cardiac medications before your next appointment, please call your pharmacy*   Lab Work: None  If you have labs (blood work) drawn today and your tests are completely normal, you will receive your results only by: Marland Kitchen MyChart Message (if you have MyChart) OR . A paper copy in the mail If you have any lab test that is abnormal or we need to change your treatment, we will call you to review the results.   Testing/Procedures: None   Follow-Up: At [facility], you and your health needs are our priority.  As part of our continuing mission to provide you with exceptional heart care, we have created designated Provider Care Teams.  These Care Teams include your primary Cardiologist (physician) and Advanced Practice Providers (APPs -  Physician Assistants and Nurse Practitioners) who all work together to provide you with the care you need, when you need it.  We recommend signing up for the patient portal called "MyChart".  Sign up information is provided on this After Visit Summary.  MyChart is used to connect with patients for Virtual Visits (Telemedicine).  Patients are able to view lab/test results, encounter notes, upcoming appointments, etc.  Non-urgent messages can be sent to your provider as well.   To learn more about what you can do with MyChart, go to [email or web address].    Your next appointment:   6 month(s)  The format for your next appointment:   In Person  Provider:   Julien Nordmann, MD or Nicolasa Ducking, NP

## 2020-06-01 ENCOUNTER — Ambulatory Visit: Payer: Medicare HMO | Admitting: Physician Assistant

## 2020-06-09 ENCOUNTER — Emergency Department: Payer: Medicare HMO

## 2020-06-09 ENCOUNTER — Other Ambulatory Visit: Payer: Self-pay

## 2020-06-09 ENCOUNTER — Emergency Department
Admission: EM | Admit: 2020-06-09 | Discharge: 2020-06-09 | Disposition: A | Discharge status: Home / Self Care | Payer: Medicare HMO | Attending: Emergency Medicine | Admitting: Emergency Medicine

## 2020-06-09 DIAGNOSIS — Z7982 Long term (current) use of aspirin: Secondary | ICD-10-CM | POA: Insufficient documentation

## 2020-06-09 DIAGNOSIS — Z7984 Long term (current) use of oral hypoglycemic drugs: Secondary | ICD-10-CM | POA: Insufficient documentation

## 2020-06-09 DIAGNOSIS — H538 Other visual disturbances: Secondary | ICD-10-CM | POA: Diagnosis not present

## 2020-06-09 DIAGNOSIS — E785 Hyperlipidemia, unspecified: Secondary | ICD-10-CM | POA: Insufficient documentation

## 2020-06-09 DIAGNOSIS — G319 Degenerative disease of nervous system, unspecified: Secondary | ICD-10-CM | POA: Insufficient documentation

## 2020-06-09 DIAGNOSIS — Z87891 Personal history of nicotine dependence: Secondary | ICD-10-CM | POA: Diagnosis not present

## 2020-06-09 DIAGNOSIS — Z833 Family history of diabetes mellitus: Secondary | ICD-10-CM | POA: Diagnosis not present

## 2020-06-09 DIAGNOSIS — Z8249 Family history of ischemic heart disease and other diseases of the circulatory system: Secondary | ICD-10-CM | POA: Diagnosis not present

## 2020-06-09 DIAGNOSIS — Z79899 Other long term (current) drug therapy: Secondary | ICD-10-CM | POA: Diagnosis not present

## 2020-06-09 DIAGNOSIS — I10 Essential (primary) hypertension: Secondary | ICD-10-CM | POA: Insufficient documentation

## 2020-06-09 DIAGNOSIS — H539 Unspecified visual disturbance: Secondary | ICD-10-CM

## 2020-06-09 DIAGNOSIS — I2510 Atherosclerotic heart disease of native coronary artery without angina pectoris: Secondary | ICD-10-CM | POA: Insufficient documentation

## 2020-06-09 DIAGNOSIS — Z950 Presence of cardiac pacemaker: Secondary | ICD-10-CM | POA: Diagnosis not present

## 2020-06-09 LAB — BASIC METABOLIC PANEL
Anion gap: 10 (ref 5–15)
BUN: 21 mg/dL (ref 8–23)
CO2: 25 mmol/L (ref 22–32)
Calcium: 9.5 mg/dL (ref 8.9–10.3)
Chloride: 106 mmol/L (ref 98–111)
Creatinine, Ser: 1.29 mg/dL — ABNORMAL HIGH (ref 0.44–1.00)
GFR, Estimated: 43 mL/min — ABNORMAL LOW (ref >60)
Glucose, Bld: 147 mg/dL — ABNORMAL HIGH (ref 70–99)
Potassium: 4.1 mmol/L (ref 3.5–5.1)
Sodium: 141 mmol/L (ref 135–145)

## 2020-06-09 LAB — CBC WITH DIFFERENTIAL/PLATELET
Abs Immature Granulocytes: 0.02 10*3/uL (ref 0.00–0.07)
Basophils Absolute: 0 10*3/uL (ref 0.0–0.1)
Basophils Relative: 1 %
Eosinophils Absolute: 0.1 10*3/uL (ref 0.0–0.5)
Eosinophils Relative: 2 %
HCT: 38.8 % (ref 36.0–46.0)
Hemoglobin: 12.2 g/dL (ref 12.0–15.0)
Immature Granulocytes: 0 %
Lymphocytes Relative: 22 %
Lymphs Abs: 1.2 10*3/uL (ref 0.7–4.0)
MCH: 26.5 pg (ref 26.0–34.0)
MCHC: 31.4 g/dL (ref 30.0–36.0)
MCV: 84.3 fL (ref 80.0–100.0)
Monocytes Absolute: 0.4 10*3/uL (ref 0.1–1.0)
Monocytes Relative: 8 %
Neutro Abs: 3.6 10*3/uL (ref 1.7–7.7)
Neutrophils Relative %: 67 %
Platelets: 195 10*3/uL (ref 150–400)
RBC: 4.6 MIL/uL (ref 3.87–5.11)
RDW: 14.5 % (ref 11.5–15.5)
WBC: 5.5 10*3/uL (ref 4.0–10.5)
nRBC: 0 % (ref 0.0–0.2)

## 2020-06-09 NOTE — ED Notes (Signed)
Pt to CT

## 2020-06-09 NOTE — Discharge Instructions (Signed)
Your lab tests and CT scan of the head were okay today.  Please continue following up with your primary care doctor for further assessment of your symptoms.  Continue taking all of your medications.
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
## 2020-06-09 NOTE — ED Notes (Signed)
Lab called to obtain labs as pt has been stuck X 4 with no success in blood draw.

## 2020-06-09 NOTE — ED Notes (Signed)
Attempt to call Shannon Chen, who is the primary contact for the group home-no answer and mailbox full.

## 2020-06-09 NOTE — ED Notes (Signed)
Talked with Burnetta Sabin, pt ride is on the way

## 2020-06-09 NOTE — ED Triage Notes (Signed)
BIB [facility] from group home due to vision changes for approx 5 minutes this AM. Pt vision is now back to normal currently. No hx of TIA or stroke. Pt at baseline mentality currently. No complaints. CBG normal and VSS with EMS

## 2020-07-05 ENCOUNTER — Emergency Department
Admission: EM | Admit: 2020-07-05 | Discharge: 2020-07-05 | Disposition: A | Discharge status: Home / Self Care | Payer: Medicare HMO | Attending: Emergency Medicine | Admitting: Emergency Medicine

## 2020-07-05 DIAGNOSIS — I2510 Atherosclerotic heart disease of native coronary artery without angina pectoris: Secondary | ICD-10-CM | POA: Diagnosis not present

## 2020-07-05 DIAGNOSIS — Z950 Presence of cardiac pacemaker: Secondary | ICD-10-CM | POA: Insufficient documentation

## 2020-07-05 DIAGNOSIS — R42 Dizziness and giddiness: Secondary | ICD-10-CM | POA: Diagnosis present

## 2020-07-05 DIAGNOSIS — Z7982 Long term (current) use of aspirin: Secondary | ICD-10-CM | POA: Insufficient documentation

## 2020-07-05 DIAGNOSIS — I10 Essential (primary) hypertension: Secondary | ICD-10-CM | POA: Diagnosis not present

## 2020-07-05 DIAGNOSIS — Z87891 Personal history of nicotine dependence: Secondary | ICD-10-CM | POA: Insufficient documentation

## 2020-07-05 DIAGNOSIS — Z79899 Other long term (current) drug therapy: Secondary | ICD-10-CM | POA: Insufficient documentation

## 2020-07-05 DIAGNOSIS — I959 Hypotension, unspecified: Secondary | ICD-10-CM | POA: Diagnosis not present

## 2020-07-05 DIAGNOSIS — R55 Syncope and collapse: Secondary | ICD-10-CM | POA: Diagnosis not present

## 2020-07-05 LAB — BASIC METABOLIC PANEL
Anion gap: 10 (ref 5–15)
BUN: 22 mg/dL (ref 8–23)
CO2: 20 mmol/L — ABNORMAL LOW (ref 22–32)
Calcium: 9.2 mg/dL (ref 8.9–10.3)
Chloride: 111 mmol/L (ref 98–111)
Creatinine, Ser: 1.18 mg/dL — ABNORMAL HIGH (ref 0.44–1.00)
GFR, Estimated: 48 mL/min — ABNORMAL LOW (ref >60)
Glucose, Bld: 93 mg/dL (ref 70–99)
Potassium: 4 mmol/L (ref 3.5–5.1)
Sodium: 141 mmol/L (ref 135–145)

## 2020-07-05 LAB — URINALYSIS, COMPLETE (UACMP) WITH MICROSCOPIC
Bilirubin Urine: NEGATIVE
Glucose, UA: 150 mg/dL — AB
Ketones, ur: NEGATIVE mg/dL
Leukocytes,Ua: NEGATIVE
Nitrite: NEGATIVE
Protein, ur: 100 mg/dL — AB
Specific Gravity, Urine: 1.013 (ref 1.005–1.030)
pH: 6 (ref 5.0–8.0)

## 2020-07-05 LAB — CBG MONITORING, ED: Glucose-Capillary: 118 mg/dL — ABNORMAL HIGH (ref 70–99)

## 2020-07-05 LAB — CBC
HCT: 38.4 % (ref 36.0–46.0)
Hemoglobin: 12.7 g/dL (ref 12.0–15.0)
MCH: 27.4 pg (ref 26.0–34.0)
MCHC: 33.1 g/dL (ref 30.0–36.0)
MCV: 82.8 fL (ref 80.0–100.0)
Platelets: 139 10*3/uL — ABNORMAL LOW (ref 150–400)
RBC: 4.64 MIL/uL (ref 3.87–5.11)
RDW: 14.3 % (ref 11.5–15.5)
WBC: 7.2 10*3/uL (ref 4.0–10.5)
nRBC: 0 % (ref 0.0–0.2)

## 2020-07-05 MED ORDER — SODIUM CHLORIDE 0.9 % IV BOLUS
1000.0000 mL | Freq: Once | INTRAVENOUS | Status: AC
  Administered 2020-07-05: 1000 mL via INTRAVENOUS

## 2020-07-05 NOTE — ED Notes (Signed)
Spoke with staff at [employer] [facility], staff state they will send someone to transport patient back to facility.

## 2020-07-05 NOTE — ED Notes (Signed)
Diet order entered per verbal order from Dr Vicente Males, awaiting food tray from cafeteria.

## 2020-07-05 NOTE — ED Notes (Signed)
Pt ambulatory to restroom with steady gait, assisted back to bed.

## 2020-07-05 NOTE — ED Notes (Signed)
Lab called to obtain ordered labs due to pt being a difficult stick.

## 2020-07-05 NOTE — ED Notes (Signed)
Unsuccessful attempt to start IV x2, left antecubital and right antecubital. Gauze bandages applied. Charge RN at bedside attempting IV access.
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
## 2020-07-23 ENCOUNTER — Ambulatory Visit (INDEPENDENT_AMBULATORY_CARE_PROVIDER_SITE_OTHER): Payer: Medicare HMO | Admitting: Podiatry

## 2020-07-23 ENCOUNTER — Other Ambulatory Visit: Payer: Self-pay

## 2020-07-23 ENCOUNTER — Encounter: Payer: Self-pay | Admitting: Podiatry

## 2020-07-23 DIAGNOSIS — E1159 Type 2 diabetes mellitus with other circulatory complications: Secondary | ICD-10-CM | POA: Insufficient documentation

## 2020-07-23 DIAGNOSIS — M79675 Pain in left toe(s): Secondary | ICD-10-CM

## 2020-07-23 DIAGNOSIS — B351 Tinea unguium: Secondary | ICD-10-CM | POA: Diagnosis not present

## 2020-07-23 DIAGNOSIS — E1142 Type 2 diabetes mellitus with diabetic polyneuropathy: Secondary | ICD-10-CM

## 2020-07-23 DIAGNOSIS — E1140 Type 2 diabetes mellitus with diabetic neuropathy, unspecified: Secondary | ICD-10-CM | POA: Insufficient documentation

## 2020-07-23 DIAGNOSIS — E1169 Type 2 diabetes mellitus with other specified complication: Secondary | ICD-10-CM | POA: Insufficient documentation

## 2020-07-23 DIAGNOSIS — M79674 Pain in right toe(s): Secondary | ICD-10-CM

## 2020-07-23 NOTE — Progress Notes (Signed)
This patient returns to my office for at risk foot care.  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  This patient is unable to cut nails herself since the patient cannot reach her nails.These nails are painful walking and wearing shoes.  This patient presents for at risk foot care today.  General Appearance  Alert, conversant and in no acute stress.  Vascular  Dorsalis pedis and posterior tibial  pulses are absent  bilaterally.  Capillary return is within normal limits  bilaterally. Cold feet.    Bilaterally. Absent digital hair  B/L.  Neurologic  Senn-Weinstein monofilament wire test absent  bilaterally. .  Nails Thick disfigured discolored nails with subungual debris  from hallux to fifth toes bilaterally. No evidence of bacterial infection or drainage bilaterally.  Orthopedic  No limitations of motion  feet .  No crepitus or effusions noted.  No bony pathology or digital deformities noted.  Skin  normotropic skin with no porokeratosis noted bilaterally.  No signs of infections or ulcers noted.     Onychomycosis  Pain in right toes  Pain in left toes  Consent was obtained for treatment procedures.   Mechanical debridement of nails 1-5  bilaterally performed with a nail nipper.  Filed with dremel without incident.    Return office visit   3   months                   Told patient to return for periodic foot care and evaluation due to potential at risk complications.   Kylin Genna DPM  

## 2020-08-21 ENCOUNTER — Encounter: Payer: Medicare HMO | Admitting: Internal Medicine
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
## 2020-08-23 ENCOUNTER — Other Ambulatory Visit (INDEPENDENT_AMBULATORY_CARE_PROVIDER_SITE_OTHER): Payer: Self-pay | Admitting: Vascular Surgery

## 2020-08-23 DIAGNOSIS — I6523 Occlusion and stenosis of bilateral carotid arteries: Secondary | ICD-10-CM

## 2020-08-27 ENCOUNTER — Ambulatory Visit (INDEPENDENT_AMBULATORY_CARE_PROVIDER_SITE_OTHER): Payer: Medicare HMO

## 2020-08-27 ENCOUNTER — Other Ambulatory Visit: Payer: Self-pay

## 2020-08-27 ENCOUNTER — Ambulatory Visit (INDEPENDENT_AMBULATORY_CARE_PROVIDER_SITE_OTHER): Payer: Medicare HMO | Admitting: Nurse Practitioner

## 2020-08-27 VITALS — BP 129/71 | HR 62 | Ht 65.0 in | Wt 140.0 lb

## 2020-08-27 DIAGNOSIS — I10 Essential (primary) hypertension: Secondary | ICD-10-CM | POA: Diagnosis not present

## 2020-08-27 DIAGNOSIS — I6523 Occlusion and stenosis of bilateral carotid arteries: Secondary | ICD-10-CM | POA: Diagnosis not present

## 2020-08-27 DIAGNOSIS — E785 Hyperlipidemia, unspecified: Secondary | ICD-10-CM

## 2020-08-28 ENCOUNTER — Encounter: Payer: Self-pay | Admitting: Nurse Practitioner

## 2020-09-01 ENCOUNTER — Encounter (INDEPENDENT_AMBULATORY_CARE_PROVIDER_SITE_OTHER): Payer: Self-pay | Admitting: Nurse Practitioner

## 2020-09-01 NOTE — Progress Notes (Signed)
Subjective:    Patient ID: Shannon Chen, female    DOB: 1945/04/23, 75 y.o.   MRN: [id number] Chief Complaint  Patient presents with   Follow-up    6 Mo carotid US    The patient is seen for follow up evaluation of carotid stenosis. The carotid stenosis followed by ultrasound.   The patient denies amaurosis fugax. There is no recent history of TIA symptoms or focal motor deficits. There is no prior documented CVA.  The patient is taking enteric-coated aspirin 81 mg daily.  There is no history of migraine headaches. There is no history of seizures.  The patient has a history of coronary artery disease, no recent episodes of angina or shortness of breath. The patient denies PAD or claudication symptoms. There is a history of hyperlipidemia which is being treated with a statin.    Duplex ultrasound shows 40 to 59% stenosis of the right ICA with 1 to 39% stenosis of the left.  There is a noted left subclavian stenosis as well   Review of Systems  Neurological:  Positive for weakness.  All other systems reviewed and are negative.     Objective:   Physical Exam Vitals reviewed.  HENT:     Head: Normocephalic.  Neck:     Vascular: No carotid bruit.  Cardiovascular:     Rate and Rhythm: Normal rate.     Pulses: Normal pulses.  Pulmonary:     Effort: Pulmonary effort is normal.  Neurological:     Mental Status: She is alert and oriented to person, place, and time.  Psychiatric:        Mood and Affect: Mood normal.        Behavior: Behavior normal.        Thought Content: Thought content normal.        Judgment: Judgment normal.    BP 129/71   Pulse 62   Ht 5\' 5"  (1.651 m)   Wt 140 lb (63.5 kg)   BMI 23.30 kg/m   Past Medical History:  Diagnosis Date   Arthritis    AV block, Mobitz II    a. 09/2016 s/p MDT 10/2016 Azure XT DR MRI DC PPM   Carotid artery disease ([name])    a. 09/2016 Carotid U/S: RICA 70-99%, LICA 50-69%;  b. 09/2016 CTA Neck: RICA 60, RCCA 30, LICA  70%, L Vert 60, L Basilar 70; c. 10/2019 s/p R CEA.   Essential hypertension    History of stress test    a. 12/2016 MV: EF 51%, no ischemia/infarct; b. 10/2019 MV: EF 61%, no ischemia/infarct.   Hyperlipidemia    Hypertrophic cardiomyopathy ([name])    a. 09/2016 Echo: EF 60-65%, no rwma, Gr1 DD, Ca2+ MV annulus; b. 11/2017 Echo: EF 70-75%, no rwma, Gr1 DD, mild AS vs dynamic LVOT obs. Nl RV fxn. Hyperdynamic LVEF w/ sev LVH; c. 10/2019 Echo: EF 70-75%, no rwma, gr1 DD, sev LVH, Gr1 DD, nl PASP, triv MR, mild-mod Ao sclerosis; c. 05/2020 Echo: EF 60-65%, no rwma, sev conc LVH, gr1 DD, nl RV fxn. RVSP 21.30mmHg. Mildly dil LA.   Presence of permanent cardiac pacemaker     Social History   Socioeconomic History   Marital status: Single    Spouse name: Not on file   Number of children: Not on file   Years of education: Not on file   Highest education level: Not on file  Occupational History   Not on file  Tobacco Use  Smoking status: Not on file   Smokeless tobacco: Never  Vaping Use   Vaping Use: Never used  Substance and Sexual Activity   Alcohol use: No   Drug use: No   Sexual activity: Not on file  Other Topics Concern   Not on file  Social History Narrative   ** Merged History Encounter **       Independent at baseline Ambulates without any assistance. Lives by herself   Social Determinants of Health   Financial Resource Strain: Not on file  Food Insecurity: Not on file  Transportation Needs: Not on file  Physical Activity: Not on file  Stress: Not on file  Social Connections: Not on file  Intimate Partner Violence: Not on file    Past Surgical History:  Procedure Laterality Date   ABDOMINAL HYSTERECTOMY     ENDARTERECTOMY Right 11/16/2019   Procedure: ENDARTERECTOMY CAROTID;  Surgeon: Renford Dills, MD;  Location: [facility] ORS;  Service: Vascular;  Laterality: Right;   PACEMAKER IMPLANT N/A 10/08/2016   Procedure: Pacemaker Implant;  Surgeon: Marinus Maw, MD;   Location: [facility];  Service: Cardiovascular;  Laterality: N/A;    Family History  Problem Relation Age of Onset   Diabetes Mother    CAD Father     No Known Allergies  CBC Latest Ref Rng & Units 07/05/2020 06/09/2020 05/21/2020  WBC 4.0 - 10.5 K/uL 7.2 5.5 4.6  Hemoglobin 12.0 - 15.0 g/dL 98.9 21.1 11.4(L)  Hematocrit 36.0 - 46.0 % 38.4 38.8 35.1(L)  Platelets 150 - 400 K/uL 139(L) 195 177      CMP     Component Value Date/Time   NA 141 07/05/2020 1116   K 4.0 07/05/2020 1116   CL 111 07/05/2020 1116   CO2 20 (L) 07/05/2020 1116   GLUCOSE 93 07/05/2020 1116   BUN 22 07/05/2020 1116   CREATININE 1.18 (H) 07/05/2020 1116   CALCIUM 9.2 07/05/2020 1116   PROT 7.5 05/18/2020 0816   ALBUMIN 3.7 05/18/2020 0816   AST 24 05/18/2020 0816   ALT 13 05/18/2020 0816   ALKPHOS 63 05/18/2020 0816   BILITOT 1.6 (H) 05/18/2020 0816   GFRNONAA 48 (L) 07/05/2020 1116   GFRAA >60 11/18/2019 0619     No results found.     Assessment & Plan:   1. Bilateral carotid artery stenosis Recommend:  Given the patient's asymptomatic subcritical stenosis no further invasive testing or surgery at this time.  Duplex ultrasound shows 40 to 59% stenosis of the right ICA with 1 to 39% stenosis of the left.  There is a noted left subclavian stenosis as well  Continue antiplatelet therapy as prescribed Continue management of CAD, HTN and Hyperlipidemia Healthy heart diet,  encouraged exercise at least 4 times per week Follow up in 12 months with duplex ultrasound and physical exam    2. Dyslipidemia Continue statin as ordered and reviewed, no changes at this time   3. Essential hypertension Continue antihypertensive medications as already ordered, these medications have been reviewed and there are no changes at this time.    Current Outpatient Medications on File Prior to Visit  Medication Sig Dispense Refill   acetaminophen (TYLENOL) 325 MG tablet Take 650 mg by mouth every 4  (four) hours as needed.     aspirin EC 81 MG EC tablet Take 1 tablet (81 mg total) by mouth daily. Swallow whole. 30 tablet 11   busPIRone (BUSPAR) 7.5 MG tablet Take 7.5 mg by mouth  daily.      dapagliflozin propanediol (FARXIGA) 10 MG TABS tablet Take 10 mg by mouth daily.     loperamide (IMODIUM) 2 MG capsule Take 2 mg by mouth as needed.     nitrofurantoin (MACRODANTIN) 100 MG capsule Take 100 mg by mouth 4 (four) times daily.     polyethylene glycol (MIRALAX) 17 g packet Take 17 g by mouth daily as needed for mild constipation. 14 each 2   rosuvastatin (CRESTOR) 40 MG tablet Take 1 tablet (40 mg total) by mouth daily. 90 tablet 3   traMADol (ULTRAM) 50 MG tablet Take 50 mg by mouth every 6 (six) hours as needed.     amLODipine (NORVASC) 5 MG tablet Take 1 tablet (5 mg total) by mouth daily as needed. Take for BP >150 systolic (Patient taking differently: Take 5 mg by mouth daily. Use only if BP >150 systolic) 90 tablet 3   No current facility-administered medications on file prior to visit.    There are no Patient Instructions on file for this visit. No follow-ups on file.   Georgiana Spinner, NP

## 2020-09-10 ENCOUNTER — Telehealth: Payer: Self-pay | Admitting: Cardiovascular Disease

## 2020-09-10 NOTE — Telephone Encounter (Signed)
Patients group home administrator from [facility] is calling in needing an order faxed for this patient   Need orders for  Daily BP/HR checks on Right arm only   Fax [phone]

## 2020-09-10 NOTE — Telephone Encounter (Signed)
Called back to speak with [facility] caregiver at facility but she is not available. They took my information and will call back when she returns.

## 2020-09-13 NOTE — Telephone Encounter (Signed)
Ok to fax order for:  Daily BP/HR checks on Right arm only.

## 2020-09-13 NOTE — Telephone Encounter (Signed)
Shannon Chen- you last saw this patient in April 2022. I'm not quite sure why the patient's caregiver is calling now for this order, but is there any reason not to send it?

## 2020-09-14 NOTE — Telephone Encounter (Addendum)
Order faxed to number provided in previous note. Confirmation received and placed in my file box under "Faxed"

## 2020-10-04 ENCOUNTER — Other Ambulatory Visit: Payer: Self-pay | Admitting: Cardiovascular Disease

## 2020-10-08 ENCOUNTER — Encounter: Payer: Self-pay | Admitting: Cardiovascular Disease

## 2020-10-08 ENCOUNTER — Ambulatory Visit (INDEPENDENT_AMBULATORY_CARE_PROVIDER_SITE_OTHER): Payer: Medicare HMO | Admitting: Cardiovascular Disease

## 2020-10-08 ENCOUNTER — Other Ambulatory Visit: Payer: Self-pay

## 2020-10-08 VITALS — BP 120/70 | HR 66 | Ht 65.5 in | Wt 140.5 lb

## 2020-10-08 DIAGNOSIS — I739 Peripheral vascular disease, unspecified: Secondary | ICD-10-CM

## 2020-10-08 DIAGNOSIS — E1159 Type 2 diabetes mellitus with other circulatory complications: Secondary | ICD-10-CM

## 2020-10-08 DIAGNOSIS — I10 Essential (primary) hypertension: Secondary | ICD-10-CM

## 2020-10-08 DIAGNOSIS — I441 Atrioventricular block, second degree: Secondary | ICD-10-CM | POA: Diagnosis not present
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
## 2020-10-08 NOTE — Patient Instructions (Signed)
Medication Instructions:  No changes  If you need a refill on your cardiac medications before your next appointment, please call your pharmacy.    Lab work: No new labs needed   If you have labs (blood work) drawn today and your tests are completely normal, you will receive your results only by: MyChart Message (if you have MyChart) OR A paper copy in the mail If you have any lab test that is abnormal or we need to change your treatment, we will call you to review the results.   Testing/Procedures: No new testing needed   Follow-Up: At [facility], you and your health needs are our priority.  As part of our continuing mission to provide you with exceptional heart care, we have created designated Provider Care Teams.  These Care Teams include your primary Cardiologist (physician) and Advanced Practice Providers (APPs -  Physician Assistants and Nurse Practitioners) who all work together to provide you with the care you need, when you need it.  You will need a follow up appointment in 12 months  Providers on your designated Care Team:   Christopher Berge, NP Ryan Dunn, PA-C Jacquelyn Visser, PA-C Cadence Furth, PA-C  Any Other Special Instructions Will Be Listed Below (If Applicable).  COVID-19 Vaccine Information can be found at: [email or web address] For questions related to vaccine distribution or appointments, please email [email or web address] or call [phone].    

## 2020-10-29 ENCOUNTER — Ambulatory Visit (INDEPENDENT_AMBULATORY_CARE_PROVIDER_SITE_OTHER): Payer: Medicare HMO | Admitting: Podiatry

## 2020-10-29 ENCOUNTER — Encounter: Payer: Self-pay | Admitting: Podiatry

## 2020-10-29 ENCOUNTER — Other Ambulatory Visit: Payer: Self-pay

## 2020-10-29 DIAGNOSIS — E1142 Type 2 diabetes mellitus with diabetic polyneuropathy: Secondary | ICD-10-CM

## 2020-10-29 DIAGNOSIS — B351 Tinea unguium: Secondary | ICD-10-CM

## 2020-10-29 DIAGNOSIS — M79674 Pain in right toe(s): Secondary | ICD-10-CM

## 2020-10-29 DIAGNOSIS — M79675 Pain in left toe(s): Secondary | ICD-10-CM | POA: Diagnosis not present

## 2020-10-29 DIAGNOSIS — N179 Acute kidney failure, unspecified: Secondary | ICD-10-CM

## 2020-10-29 NOTE — Progress Notes (Signed)
This patient returns to my office for at risk foot care.  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  This patient is unable to cut nails herself since the patient cannot reach her nails.These nails are painful walking and wearing shoes.  This patient presents for at risk foot care today.  General Appearance  Alert, conversant and in no acute stress.  Vascular  Dorsalis pedis and posterior tibial  pulses are absent  bilaterally.  Capillary return is within normal limits  bilaterally. Cold feet.    Bilaterally. Absent digital hair  B/L.  Neurologic  Senn-Weinstein monofilament wire test absent  bilaterally. .  Nails Thick disfigured discolored nails with subungual debris  from hallux to fifth toes bilaterally. No evidence of bacterial infection or drainage bilaterally.  Orthopedic  No limitations of motion  feet .  No crepitus or effusions noted.  No bony pathology or digital deformities noted.  Skin  normotropic skin with no porokeratosis noted bilaterally.  No signs of infections or ulcers noted.     Onychomycosis  Pain in right toes  Pain in left toes  Consent was obtained for treatment procedures.   Mechanical debridement of nails 1-5  bilaterally performed with a nail nipper.  Filed with dremel without incident.    Return office visit   4   months                   Told patient to return for periodic foot care and evaluation due to potential at risk complications.   Shannon Chen DPM  

## 2020-10-30 ENCOUNTER — Ambulatory Visit (INDEPENDENT_AMBULATORY_CARE_PROVIDER_SITE_OTHER): Payer: Medicare HMO | Admitting: Internal Medicine

## 2020-10-30 ENCOUNTER — Telehealth: Payer: Self-pay | Admitting: Internal Medicine

## 2020-10-30 ENCOUNTER — Encounter: Payer: Self-pay | Admitting: Internal Medicine

## 2020-10-30 VITALS — BP 150/70 | HR 65 | Ht 65.5 in | Wt 143.0 lb

## 2020-10-30 DIAGNOSIS — I10 Essential (primary) hypertension: Secondary | ICD-10-CM | POA: Diagnosis not present

## 2020-10-30 DIAGNOSIS — I951 Orthostatic hypotension: Secondary | ICD-10-CM

## 2020-10-30 DIAGNOSIS — I422 Other hypertrophic cardiomyopathy: Secondary | ICD-10-CM | POA: Diagnosis not present

## 2020-10-30 DIAGNOSIS — Z950 Presence of cardiac pacemaker: Secondary | ICD-10-CM

## 2020-10-30 DIAGNOSIS — I441 Atrioventricular block, second degree: Secondary | ICD-10-CM | POA: Diagnosis not present

## 2020-10-30 MED ORDER — AMLODIPINE BESYLATE 2.5 MG PO TABS: 2.5000 mg | ORAL_TABLET | Freq: Every day | ORAL | 3 refills | Status: DC

## 2020-10-30 NOTE — Progress Notes (Signed)
Patient Care Team: Armando Gang, FNP as PCP - General (Family Medicine) Antonieta Iba, MD as PCP - Cardiology (Cardiology) Duke Salvia, MD as PCP - Electrophysiology (Cardiology) Franciso Bend, NP (Nurse Practitioner)   HPI  Shannon Chen is a 75 y.o. female Seen in followup for Medtronic Para Hisian PM implanted 8/18 for symptomatic second-degree heart block assoc w syncope.  Has significant hypertension and has variably reported left ventricular hypertrophy over the last 4 years (1.2-1.8 cm septum- 1.1/1.8 cm posterior wall)     Underwent endarterectomy 9/21 right carotid in the setting of a prior stroke.  Now living in facility, sticks to herself  reads detective stories  The patient denies chest pain, shortness of breath, nocturnal dyspnea, orthopnea or peripheral edema.  There have been no palpitations, lightheadedness or syncope.     DATE TEST EF   8/18 Echo   65 %  LVH minimal 1.2/1.1 cm  11/18 Myoview     % No ischemia  3/21 Echo    LVH 1.62/1.82 cm (cavity obliteration)  9/21 Echo  70-75% Indeterminate cusps//LVH 1.8/1.47 cm  9/21 Myoview 61% No ischemia     Records and Results Reviewed  Date Cr K Hgb  8/18  0.81 3.8 13.6  10/19  0.65 3.9      Past Medical History:  Diagnosis Date   Arthritis    AV block, Mobitz II    a. 09/2016 s/p MDT [id number] Azure XT DR MRI DC PPM   Carotid artery disease ([name])    a. 09/2016 Carotid U/S: RICA 70-99%, LICA 50-69%;  b. 09/2016 CTA Neck: RICA 60, RCCA 30, LICA 70%, L Vert 60, L Basilar 70; c. 10/2019 s/p R CEA.   Essential hypertension    History of stress test    a. 12/2016 MV: EF 51%, no ischemia/infarct; b. 10/2019 MV: EF 61%, no ischemia/infarct.   Hyperlipidemia    Hypertrophic cardiomyopathy ([name])    a. 09/2016 Echo: EF 60-65%, no rwma, Gr1 DD, Ca2+ MV annulus; b. 11/2017 Echo: EF 70-75%, no rwma, Gr1 DD, mild AS vs dynamic LVOT obs. Nl RV fxn. Hyperdynamic LVEF w/ sev LVH; c. 10/2019 Echo: EF  70-75%, no rwma, gr1 DD, sev LVH, Gr1 DD, nl PASP, triv MR, mild-mod Ao sclerosis; c. 05/2020 Echo: EF 60-65%, no rwma, sev conc LVH, gr1 DD, nl RV fxn. RVSP 21.67mmHg. Mildly dil LA.   Presence of permanent cardiac pacemaker     Past Surgical History:  Procedure Laterality Date   ABDOMINAL HYSTERECTOMY     ENDARTERECTOMY Right 11/16/2019   Procedure: ENDARTERECTOMY CAROTID;  Surgeon: Renford Dills, MD;  Location: [facility] ORS;  Service: Vascular;  Laterality: Right;   PACEMAKER IMPLANT N/A 10/08/2016   Procedure: Pacemaker Implant;  Surgeon: Marinus Maw, MD;  Location: [facility] INVASIVE CV LAB;  Service: Cardiovascular;  Laterality: N/A;    Current Outpatient Medications  Medication Sig Dispense Refill   acetaminophen (TYLENOL) 325 MG tablet Take 650 mg by mouth every 4 (four) hours as needed.     aspirin EC 81 MG EC tablet Take 1 tablet (81 mg total) by mouth daily. Swallow whole. 30 tablet 11   dapagliflozin propanediol (FARXIGA) 10 MG TABS tablet Take 10 mg by mouth daily.     Finerenone (KERENDIA) 20 MG TABS Take 20 mg by mouth daily.     loperamide (IMODIUM) 2 MG capsule Take 2 mg by mouth as needed.     metoprolol tartrate (  LOPRESSOR) 25 MG tablet Take 25 mg by mouth 2 (two) times daily.     mirtazapine (REMERON) 15 MG tablet Take 15 mg by mouth at bedtime.     nitrofurantoin (MACRODANTIN) 100 MG capsule Take 100 mg by mouth 4 (four) times daily.     polyethylene glycol (MIRALAX) 17 g packet Take 17 g by mouth daily as needed for mild constipation. 14 each 2   potassium chloride (KLOR-CON) 10 MEQ tablet Take 10 mEq by mouth 2 (two) times daily.     rosuvastatin (CRESTOR) 40 MG tablet TAKE 1 TABLET BY MOUTH DAILY 90 tablet 0   traMADol (ULTRAM) 50 MG tablet Take 50 mg by mouth every 6 (six) hours as needed.     Vibegron (GEMTESA) 75 MG TABS Take 75 mg by mouth daily.     amLODipine (NORVASC) 5 MG tablet Take 1 tablet (5 mg total) by mouth daily as needed. Take for BP >150 systolic  (Patient taking differently: Take 5 mg by mouth daily. Use only if BP >150 systolic) 90 tablet 3   busPIRone (BUSPAR) 7.5 MG tablet Take 7.5 mg by mouth daily.  (Patient not taking: Reported on 10/30/2020)     No current facility-administered medications for this visit.  What we have her increase  No Known Allergies    Review of Systems negative except from HPI and PMH  Physical Exam BP (!) 150/70 (BP Location: Right Arm, Patient Position: Sitting, Cuff Size: Normal)   Pulse 65   Ht 5' 5.5" (1.664 m)   Wt 143 lb (64.9 kg)   SpO2 97%   BMI 23.43 kg/m  Well developed and well nourished in no acute distress HENT normal Neck supple w Clear Device pocket well healed; without hematoma or erythema.  There is no tethering  Regular rate and rhythm,no murmur Abd-soft  No Clubbing cyanosis  edema Skin-warm and dry A & Oriented  Grossly normal sensory and motor function  ECG sinus @ 65 17/12/45 Deep twave inversions 2 3 F V2-v6  Multiple electrocardiograms were reviewed back to 2018.  T wave inversions are more prominent now than ever but have been quite prominent before but also not evident 4 (5/18)   Assessment and  Plan  Mobitz 2 AVB  Abnormal ECG-dynamic T wave changes  Syncope  Hypertrophic heart disease ?HCM   Medtronic ParaHisian Pacemaker  Hypertension   Stress  VT nonsustained     Living in a care facility.  Life is much better.  Blood pressures are elevated.  She is taking amlodipine as needed???;  We will begin her on 2.5 mg daily, as half-life making it not practical for as needed medication metoprolol 25 mg daily and finerenone 20 mg daily  ECG remains dynamic and variable.  Probably related to hypertrophic heart disease.  No known coronary disease and without symptoms of ischemia to suggest that  Normal device function.  30% ventricular pacing.  No interval syncope.     Current medicines are reviewed at length with the patient today .  The patient  does not  have concerns regarding medicines.

## 2020-10-30 NOTE — Telephone Encounter (Signed)
Pt c/o medication issue:  1. Name of Medication: amlodipine   2. How are you currently taking this medication (dosage and times per day)? Please confirm   3. Are you having a reaction (difficulty breathing--STAT)? na  4. What is your medication issue? Pharmacy has previous dose 5 mg po prn .  Please confirm old rx dc and new received rx of 2.5 mg po q d is correct

## 2020-10-30 NOTE — Patient Instructions (Signed)
Medication Instructions:  - Your physician has recommended you make the following change in your medication:   1) START amlodipine 2.5 mg- take 1 tablet by mouth once daily   *If you need a refill on your cardiac medications before your next appointment, please call your pharmacy*   Lab Work: - none ordered  If you have labs (blood work) drawn today and your tests are completely normal, you will receive your results only by: MyChart Message (if you have MyChart) OR A paper copy in the mail If you have any lab test that is abnormal or we need to change your treatment, we will call you to review the results.   Testing/Procedures: - none ordered   Follow-Up: At [facility], you and your health needs are our priority.  As part of our continuing mission to provide you with exceptional heart care, we have created designated Provider Care Teams.  These Care Teams include your primary Cardiologist (physician) and Advanced Practice Providers (APPs -  Physician Assistants and Nurse Practitioners) who all work together to provide you with the care you need, when you need it.  We recommend signing up for the patient portal called "MyChart".  Sign up information is provided on this After Visit Summary.  MyChart is used to connect with patients for Virtual Visits (Telemedicine).  Patients are able to view lab/test results, encounter notes, upcoming appointments, etc.  Non-urgent messages can be sent to your provider as well.   To learn more about what you can do with MyChart, go to [email or web address].    Your next appointment:   1 year(s)  The format for your next appointment:   In Person  Provider:   Sherryl Manges, MD   Other Instructions  Amlodipine Tablets What is this medication? AMLODIPINE (am LOE di peen) treats high blood pressure and prevents chest pain (angina). It works by relaxing the blood vessels, which helps decrease the amount of work your heart has to do. It belongs  to a group of medications called calcium channel blockers. This medicine may be used for other purposes; ask your health care provider or pharmacist if you have questions. COMMON BRAND NAME(S): Norvasc What should I tell my care team before I take this medication? They need to know if you have any of these conditions: Heart disease Liver disease An unusual or allergic reaction to amlodipine, other medications, foods, dyes, or preservatives Pregnant or trying to get pregnant Breast-feeding How should I use this medication? Take this medication by mouth. Take it as directed on the prescription label at the same time every day. You can take it with or without food. If it upsets your stomach, take it with food. Keep taking it unless your care team tells you to stop. Talk to your care team about the use of this medication in children. While it may be prescribed for children as young as 6 for selected conditions, precautions do apply. Overdosage: If you think you have taken too much of this medicine contact a poison control center or emergency room at once. NOTE: This medicine is only for you. Do not share this medicine with others. What if I miss a dose? If you miss a dose, take it as soon as you can. If it is almost time for your next dose, take only that dose. Do not take double or extra doses. What may interact with this medication? Clarithromycin Cyclosporine Diltiazem Itraconazole Simvastatin Tacrolimus This list may not describe all possible interactions. Give your health  care provider a list of all the medicines, herbs, non-prescription drugs, or dietary supplements you use. Also tell them if you smoke, drink alcohol, or use illegal drugs. Some items may interact with your medicine. What should I watch for while using this medication? Visit your health care provider for regular checks on your progress. Check your blood pressure as directed. Ask your health care provider what your blood  pressure should be. Also, find out when you should contact him or her. Do not treat yourself for coughs, colds, or pain while you are using this medication without asking your health care provider for advice. Some medications may increase your blood pressure. You may get drowsy or dizzy. Do not drive, use machinery, or do anything that needs mental alertness until you know how this medication affects you. Do not stand up or sit up quickly, especially if you are an older patient. This reduces the risk of dizzy or fainting spells. Alcohol can make you more drowsy and dizzy. Avoid alcoholic drinks. What side effects may I notice from receiving this medication? Side effects that you should report to your care team as soon as possible: Allergic reactions-skin rash, itching, hives, swelling of the face, lips, tongue, or throat Heart attack-pain or tightness in the chest, shoulders, arms, or jaw, nausea, shortness of breath, cold or clammy skin, feeling faint or lightheaded Low blood pressure-dizziness, feeling faint or lightheaded, blurry vision Side effects that usually do not require medical attention (report these to your care team if they continue or are bothersome): Facial flushing, redness Heart palpitations-rapid, pounding, or irregular heartbeat Nausea Stomach pain Swelling of the ankles, hands, or feet This list may not describe all possible side effects. Call your doctor for medical advice about side effects. You may report side effects to FDA at 1-800-FDA-1088. Where should I keep my medication? Keep out of the reach of children and pets. Store at room temperature between 20 and 25 degrees C (68 and 77 degrees F). Protect from light and moisture. Keep the container tightly closed. Get rid of any unused medication after the expiration date. To get rid of medications that are no longer needed or have expired: Take the medication to a medication take-back program. Check with your pharmacy or law  enforcement to find a location. If you cannot return the medication, check the label or package insert to see if the medication should be thrown out in the garbage or flushed down the toilet. If you are not sure, ask your health care provider. If it is safe to put in the trash, empty the medication out of the container. Mix the medication with cat litter, dirt, coffee grounds, or other unwanted substance. Seal the mixture in a bag or container. Put it in the trash. NOTE: This sheet is a summary. It may not cover all possible information. If you have questions about this medicine, talk to your doctor, pharmacist, or health care provider.  2022 [employer]/Gold Standard (2019-12-31 14:59:47)

## 2020-10-30 NOTE — Telephone Encounter (Signed)
I called and spoke with Britt Boozer, Pharmacist at [facility].  She wanted to clarify if the patient was to continue with the amlodipine 5 mg PRN order and use in addition to what Dr. [name] prescribed today for amlodipine 2.5 mg once daily, or just d/c the amlodipine 5 mg PRN order.  I have advised Jenn, to please discontinue the PRN order as the patient will be going a maintenance dose of amlodipine 2.5 mg once daily.  Jenn voices understanding and asked the I resend the E-script the with notes to the pharmacy to discontinue the amlodipine 5 mg PRN order. This needs be done as the patient is in a facility.  I advised Jenn I will resend the order- order sent.

## 2020-11-05 NOTE — Addendum Note (Signed)
Addended by: Festus Aloe on: 11/05/2020 03:49 PM   Modules accepted: Orders

## 2020-11-22 NOTE — Addendum Note (Signed)
Addended by: Festus Aloe on: 11/22/2020 12:01 PM   Modules accepted: Orders

## 2020-12-04 ENCOUNTER — Other Ambulatory Visit: Payer: Self-pay | Admitting: Cardiovascular Disease

## 2020-12-04 ENCOUNTER — Other Ambulatory Visit: Payer: Self-pay | Admitting: Obstetrics and Gynecology

## 2020-12-12 ENCOUNTER — Inpatient Hospital Stay (HOSPITAL_COMMUNITY)
Admit: 2020-12-12 | Discharge: 2020-12-12 | Disposition: A | Discharge status: Home / Self Care | Payer: Medicare HMO | Attending: Cardiology | Admitting: Cardiology

## 2020-12-12 ENCOUNTER — Inpatient Hospital Stay
Admission: EM | Admit: 2020-12-12 | Discharge: 2020-12-17 | DRG: 281 | Disposition: A | Discharge status: Home / Self Care | Payer: Medicare HMO | Attending: Student | Admitting: Student

## 2020-12-12 ENCOUNTER — Emergency Department: Payer: Medicare HMO

## 2020-12-12 ENCOUNTER — Other Ambulatory Visit: Payer: Self-pay

## 2020-12-12 DIAGNOSIS — N179 Acute kidney failure, unspecified: Secondary | ICD-10-CM | POA: Diagnosis present

## 2020-12-12 DIAGNOSIS — E1122 Type 2 diabetes mellitus with diabetic chronic kidney disease: Secondary | ICD-10-CM | POA: Diagnosis present

## 2020-12-12 DIAGNOSIS — I119 Hypertensive heart disease without heart failure: Secondary | ICD-10-CM | POA: Diagnosis present

## 2020-12-12 DIAGNOSIS — E785 Hyperlipidemia, unspecified: Secondary | ICD-10-CM | POA: Diagnosis present

## 2020-12-12 DIAGNOSIS — Z79899 Other long term (current) drug therapy: Secondary | ICD-10-CM | POA: Diagnosis not present

## 2020-12-12 DIAGNOSIS — Z20822 Contact with and (suspected) exposure to covid-19: Secondary | ICD-10-CM | POA: Diagnosis present

## 2020-12-12 DIAGNOSIS — E1169 Type 2 diabetes mellitus with other specified complication: Secondary | ICD-10-CM | POA: Diagnosis present

## 2020-12-12 DIAGNOSIS — I2584 Coronary atherosclerosis due to calcified coronary lesion: Secondary | ICD-10-CM | POA: Diagnosis present

## 2020-12-12 DIAGNOSIS — I1310 Hypertensive heart and chronic kidney disease without heart failure, with stage 1 through stage 4 chronic kidney disease, or unspecified chronic kidney disease: Secondary | ICD-10-CM | POA: Diagnosis present

## 2020-12-12 DIAGNOSIS — I4510 Unspecified right bundle-branch block: Secondary | ICD-10-CM | POA: Diagnosis present

## 2020-12-12 DIAGNOSIS — E1159 Type 2 diabetes mellitus with other circulatory complications: Secondary | ICD-10-CM | POA: Diagnosis present

## 2020-12-12 DIAGNOSIS — Z833 Family history of diabetes mellitus: Secondary | ICD-10-CM

## 2020-12-12 DIAGNOSIS — Z7982 Long term (current) use of aspirin: Secondary | ICD-10-CM | POA: Diagnosis not present

## 2020-12-12 DIAGNOSIS — Z8679 Personal history of other diseases of the circulatory system: Secondary | ICD-10-CM | POA: Diagnosis not present

## 2020-12-12 DIAGNOSIS — I2510 Atherosclerotic heart disease of native coronary artery without angina pectoris: Secondary | ICD-10-CM | POA: Diagnosis present

## 2020-12-12 DIAGNOSIS — I10 Essential (primary) hypertension: Secondary | ICD-10-CM | POA: Diagnosis present

## 2020-12-12 DIAGNOSIS — I441 Atrioventricular block, second degree: Secondary | ICD-10-CM | POA: Diagnosis present

## 2020-12-12 DIAGNOSIS — I214 Non-ST elevation (NSTEMI) myocardial infarction: Secondary | ICD-10-CM | POA: Diagnosis present

## 2020-12-12 DIAGNOSIS — Z7951 Long term (current) use of inhaled steroids: Secondary | ICD-10-CM | POA: Diagnosis not present

## 2020-12-12 DIAGNOSIS — Z950 Presence of cardiac pacemaker: Secondary | ICD-10-CM | POA: Diagnosis not present

## 2020-12-12 DIAGNOSIS — I422 Other hypertrophic cardiomyopathy: Secondary | ICD-10-CM | POA: Diagnosis not present

## 2020-12-12 DIAGNOSIS — N1831 Chronic kidney disease, stage 3a: Secondary | ICD-10-CM | POA: Diagnosis present

## 2020-12-12 DIAGNOSIS — Z7984 Long term (current) use of oral hypoglycemic drugs: Secondary | ICD-10-CM

## 2020-12-12 DIAGNOSIS — M1990 Unspecified osteoarthritis, unspecified site: Secondary | ICD-10-CM | POA: Diagnosis present

## 2020-12-12 DIAGNOSIS — Z8249 Family history of ischemic heart disease and other diseases of the circulatory system: Secondary | ICD-10-CM

## 2020-12-12 DIAGNOSIS — Z90710 Acquired absence of both cervix and uterus: Secondary | ICD-10-CM

## 2020-12-12 DIAGNOSIS — I421 Obstructive hypertrophic cardiomyopathy: Secondary | ICD-10-CM | POA: Diagnosis present

## 2020-12-12 DIAGNOSIS — R55 Syncope and collapse: Secondary | ICD-10-CM

## 2020-12-12 DIAGNOSIS — Z8673 Personal history of transient ischemic attack (TIA), and cerebral infarction without residual deficits: Secondary | ICD-10-CM | POA: Diagnosis not present

## 2020-12-12 DIAGNOSIS — Z87891 Personal history of nicotine dependence: Secondary | ICD-10-CM | POA: Diagnosis not present

## 2020-12-12 LAB — CBC
HCT: 40.1 % (ref 36.0–46.0)
Hemoglobin: 13.1 g/dL (ref 12.0–15.0)
MCH: 27.7 pg (ref 26.0–34.0)
MCHC: 32.7 g/dL (ref 30.0–36.0)
MCV: 84.8 fL (ref 80.0–100.0)
Platelets: 163 10*3/uL (ref 150–400)
RBC: 4.73 MIL/uL (ref 3.87–5.11)
RDW: 14 % (ref 11.5–15.5)
WBC: 4.1 10*3/uL (ref 4.0–10.5)
nRBC: 0 % (ref 0.0–0.2)

## 2020-12-12 LAB — BASIC METABOLIC PANEL WITH GFR
Anion gap: 6 (ref 5–15)
BUN: 26 mg/dL — ABNORMAL HIGH (ref 8–23)
CO2: 24 mmol/L (ref 22–32)
Calcium: 9.4 mg/dL (ref 8.9–10.3)
Chloride: 108 mmol/L (ref 98–111)
Creatinine, Ser: 1.41 mg/dL — ABNORMAL HIGH (ref 0.44–1.00)
GFR, Estimated: 38 mL/min — ABNORMAL LOW
Glucose, Bld: 136 mg/dL — ABNORMAL HIGH (ref 70–99)
Potassium: 3.7 mmol/L (ref 3.5–5.1)
Sodium: 138 mmol/L (ref 135–145)

## 2020-12-12 LAB — BRAIN NATRIURETIC PEPTIDE: B Natriuretic Peptide: 12.3 pg/mL (ref 0.0–100.0)

## 2020-12-12 LAB — PROTIME-INR
INR: 1.1 (ref 0.8–1.2)
Prothrombin Time: 13.7 seconds (ref 11.4–15.2)

## 2020-12-12 LAB — TROPONIN I (HIGH SENSITIVITY)
Troponin I (High Sensitivity): 2926 ng/L (ref <18)
Troponin I (High Sensitivity): 4062 ng/L (ref <18)
Troponin I (High Sensitivity): 3313 ng/L (ref <18)
Troponin I (High Sensitivity): 2419 ng/L (ref <18)

## 2020-12-12 LAB — RESP PANEL BY RT-PCR (FLU A&B, COVID) ARPGX2
Influenza A by PCR: NEGATIVE
Influenza B by PCR: NEGATIVE
SARS Coronavirus 2 by RT PCR: NEGATIVE

## 2020-12-12 LAB — HEPARIN LEVEL (UNFRACTIONATED): Heparin Unfractionated: 0.37 IU/mL (ref 0.30–0.70)

## 2020-12-12 LAB — MAGNESIUM: Magnesium: 2.2 mg/dL (ref 1.7–2.4)

## 2020-12-12 LAB — APTT: aPTT: 31 s (ref 24–36)

## 2020-12-12 MED ORDER — METOPROLOL SUCCINATE ER 25 MG PO TB24
25.0000 mg | ORAL_TABLET | Freq: Every day | ORAL | Status: DC
  Administered 2020-12-13 – 2020-12-14 (×2): 25 mg via ORAL
  Filled 2020-12-12: qty 1

## 2020-12-12 MED ORDER — METOPROLOL TARTRATE 25 MG PO TABS: 25.0000 mg | ORAL_TABLET | Freq: Two times a day (BID) | ORAL | Status: DC

## 2020-12-12 MED ORDER — TRAMADOL HCL 50 MG PO TABS
50.0000 mg | ORAL_TABLET | Freq: Four times a day (QID) | ORAL | Status: DC | PRN
Start: 2020-12-12 — End: 2020-12-17

## 2020-12-12 MED ORDER — ASPIRIN 81 MG PO CHEW
324.0000 mg | CHEWABLE_TABLET | Freq: Once | ORAL | Status: AC
  Administered 2020-12-12: 324 mg via ORAL
  Filled 2020-12-12: qty 4

## 2020-12-12 MED ORDER — MIRTAZAPINE 15 MG PO TABS
7.5000 mg | ORAL_TABLET | Freq: Every day | ORAL | Status: DC
  Administered 2020-12-12 – 2020-12-16 (×5): 7.5 mg via ORAL
  Filled 2020-12-12 (×5): qty 1

## 2020-12-12 MED ORDER — ASPIRIN 300 MG RE SUPP: 300.0000 mg | RECTAL | Status: AC

## 2020-12-12 MED ORDER — MIRTAZAPINE 15 MG PO TABS: 15.0000 mg | ORAL_TABLET | Freq: Every day | ORAL | Status: DC

## 2020-12-12 MED ORDER — ASPIRIN 81 MG PO CHEW: 324.0000 mg | CHEWABLE_TABLET | ORAL | Status: AC

## 2020-12-12 MED ORDER — ACETAMINOPHEN 325 MG PO TABS
650.0000 mg | ORAL_TABLET | ORAL | Status: DC | PRN
  Administered 2020-12-15 – 2020-12-17 (×2): 650 mg via ORAL
  Filled 2020-12-12 (×3): qty 2

## 2020-12-12 MED ORDER — HEPARIN (PORCINE) 25000 UT/250ML-% IV SOLN
750.0000 [IU]/h | INTRAVENOUS | Status: DC
  Administered 2020-12-12: 750 [IU]/h via INTRAVENOUS
  Filled 2020-12-12: qty 250

## 2020-12-12 MED ORDER — LACTATED RINGERS IV BOLUS
500.0000 mL | Freq: Once | INTRAVENOUS | Status: AC
  Administered 2020-12-12: 500 mL via INTRAVENOUS

## 2020-12-12 MED ORDER — ROSUVASTATIN CALCIUM 20 MG PO TABS
40.0000 mg | ORAL_TABLET | Freq: Every day | ORAL | Status: DC
  Administered 2020-12-12 – 2020-12-17 (×5): 40 mg via ORAL
  Filled 2020-12-12: qty 2
  Filled 2020-12-12 (×3): qty 4
  Filled 2020-12-12 (×3): qty 2
  Filled 2020-12-12: qty 4

## 2020-12-12 MED ORDER — VIBEGRON 75 MG PO TABS: 75.0000 mg | ORAL_TABLET | Freq: Every day | ORAL | Status: DC

## 2020-12-12 MED ORDER — INSULIN ASPART 100 UNIT/ML IJ SOLN
0.0000 [IU] | Freq: Three times a day (TID) | INTRAMUSCULAR | Status: DC
  Filled 2020-12-12 (×2): qty 1

## 2020-12-12 MED ORDER — HEPARIN BOLUS VIA INFUSION
3800.0000 [IU] | Freq: Once | INTRAVENOUS | Status: AC
  Administered 2020-12-12: 3800 [IU] via INTRAVENOUS
  Filled 2020-12-12: qty 3800

## 2020-12-12 MED ORDER — ONDANSETRON HCL 4 MG/2ML IJ SOLN: 4.0000 mg | Freq: Four times a day (QID) | INTRAMUSCULAR | Status: DC | PRN

## 2020-12-12 MED ORDER — NITROGLYCERIN 0.4 MG SL SUBL: 0.4000 mg | SUBLINGUAL_TABLET | SUBLINGUAL | Status: DC | PRN

## 2020-12-12 MED ORDER — ASPIRIN EC 81 MG PO TBEC: 81.0000 mg | DELAYED_RELEASE_TABLET | Freq: Every day | ORAL | Status: DC

## 2020-12-12 MED ORDER — ASPIRIN EC 81 MG PO TBEC
81.0000 mg | DELAYED_RELEASE_TABLET | Freq: Every day | ORAL | Status: DC
  Administered 2020-12-13 – 2020-12-17 (×5): 81 mg via ORAL
  Filled 2020-12-12 (×4): qty 1

## 2020-12-12 NOTE — Assessment & Plan Note (Signed)
The patient is normotensive on her home dosage of metoprolol. Monitor.

## 2020-12-12 NOTE — Consult Note (Signed)
ANTICOAGULATION CONSULT NOTE - Initial Consult  Pharmacy Consult for heparin infusion Indication: chest pain/ACS  No Known Allergies  Patient Measurements: Height: 5\' 5"  (165.1 cm) Weight: 63.5 kg (140 lb) IBW/kg (Calculated) : 57 Heparin Dosing Weight: 63.5 kg  Vital Signs: Temp: 97.9 F (36.6 C) (10/26 2208) Temp Source: Oral (10/26 2208) BP: 115/53 (10/26 2208) Pulse Rate: 75 (10/26 2208)  Labs: Recent Labs    12/12/20 0828 12/12/20 1206 12/12/20 1420 12/12/20 1743 12/12/20 2224  HGB 13.1  --   --   --   --   HCT 40.1  --   --   --   --   PLT 163  --   --   --   --   APTT  --   --  31  --   --   LABPROT  --   --  13.7  --   --   INR  --   --  1.1  --   --   HEPARINUNFRC  --   --   --   --  0.37  CREATININE 1.41*  --   --   --   --   TROPONINIHS  --    < > 4,062* 3,313* 2,419*   < > = values in this interval not displayed.     Estimated Creatinine Clearance: 30.1 mL/min (A) (by C-G formula based on SCr of 1.41 mg/dL (H)).   Medical History: Past Medical History:  Diagnosis Date   Arthritis    AV block, Mobitz II    a. 09/2016 s/p MDT 10/2016 Azure XT DR MRI DC PPM   Carotid artery disease ([name])    a. 09/2016 Carotid U/S: RICA 70-99%, LICA 50-69%;  b. 09/2016 CTA Neck: RICA 60, RCCA 30, LICA 70%, L Vert 60, L Basilar 70; c. 10/2019 s/p R CEA.   Essential hypertension    History of stress test    a. 12/2016 MV: EF 51%, no ischemia/infarct; b. 10/2019 MV: EF 61%, no ischemia/infarct.   Hyperlipidemia    Hypertrophic cardiomyopathy ([name])    a. 09/2016 Echo: EF 60-65%, no rwma, Gr1 DD, Ca2+ MV annulus; b. 11/2017 Echo: EF 70-75%, no rwma, Gr1 DD, mild AS vs dynamic LVOT obs. Nl RV fxn. Hyperdynamic LVEF w/ sev LVH; c. 10/2019 Echo: EF 70-75%, no rwma, gr1 DD, sev LVH, Gr1 DD, nl PASP, triv MR, mild-mod Ao sclerosis; c. 05/2020 Echo: EF 60-65%, no rwma, sev conc LVH, gr1 DD, nl RV fxn. RVSP 21.33mmHg. Mildly dil LA.   Presence of permanent cardiac pacemaker      Medications:  No prior AC noted   Assessment: 75 y.o. female with past medical history of carotid artery disease, hypertrophic cardiomyopathy with pacemaker, presents with an episode of lightheadedness dizziness and diaphoresis. Troponin 2900>4000. Pharmacy has been consulted for heparin infusion.  Goal of Therapy:  Heparin level 0.3-0.7 units/ml Monitor platelets by anticoagulation protocol: Yes   Plan:  10/26:  HL @ 2224 = 0.37, therapeutic X 1  Will continue pt on current rate and draw confirmation level in 8 hrs on 10/27 @ 0600.  Milt Coye D Clinical Pharmacist   12/12/2020,11:27 PM

## 2020-12-12 NOTE — Assessment & Plan Note (Signed)
Noted. Echocardiogram is being repeated. Cardiology consulted.
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
## 2020-12-12 NOTE — ED Notes (Signed)
Patient placed on pure wick °

## 2020-12-12 NOTE — H&P (Signed)
Shannon Chen is an 75 y.o. female.   Chief Complaint: lightheadedness, dizziness, and diaphoresis HPI: The patient is a 75 yr old woman who presented to [facility] ED with complaints of lightheadedness, dizziness, and diaphoresis. She denies chest pain or recent history of chest pain. She did not pass out nor did she feel that she was going to pass out. The duration of her symptoms were 10 minutes. No nausea or vomiting. It seems that she has had several of these episodes are in the last couple of months. She has sought help from her PCP for these episodes, but no cause was found.   She has a medical history significant for arthritis, carotid artery disease, remote CVA, HTN, HDL, hypertrophic cardiomyopathy with pacemaker.  EKG in the ED was unchanged from previous from 10/30/2020. CT head demonstrated no acute pathology to explain symptoms. CXR also demonstrated no acute pathology. Troponins were 2926 to 4062 to 3313.   [facility] hospitalists have been consulted to admit the patient for further evaluation and care. Cardiology has been consulted.   The patient denies fevers, chills, cough, cough productive of sputum, nausea, vomiting, syncope, swelling, shortness of breath, sores, rashes, lesions, neurological changes other than dizziness, hematochezia, melena, or coffee ground emesis.  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 09/2016 s/p MDT [id number] Azure XT DR MRI DC PPM   Carotid artery disease ([name])    a. 09/2016 Carotid U/S: RICA 70-99%, LICA 50-69%;  b. 09/2016 CTA Neck: RICA 60, RCCA 30, LICA 70%, L Vert 60, L Basilar 70; c. 10/2019 s/p R CEA.   Essential hypertension    History of stress test    a. 12/2016 MV: EF 51%, no ischemia/infarct; b. 10/2019 MV: EF 61%, no ischemia/infarct.   Hyperlipidemia    Hypertrophic cardiomyopathy ([name])    a. 09/2016 Echo: EF 60-65%, no rwma, Gr1 DD, Ca2+ MV annulus; b. 11/2017 Echo: EF 70-75%, no rwma, Gr1 DD, mild AS vs dynamic  LVOT obs. Nl RV fxn. Hyperdynamic LVEF w/ sev LVH; c. 10/2019 Echo: EF 70-75%, no rwma, gr1 DD, sev LVH, Gr1 DD, nl PASP, triv MR, mild-mod Ao sclerosis; c. 05/2020 Echo: EF 60-65%, no rwma, sev conc LVH, gr1 DD, nl RV fxn. RVSP 21.33mmHg. Mildly dil LA.   Presence of permanent cardiac pacemaker     Past Surgical History:  Procedure Laterality Date   ABDOMINAL HYSTERECTOMY     ENDARTERECTOMY Right 11/16/2019   Procedure: ENDARTERECTOMY CAROTID;  Surgeon: Renford Dills, MD;  Location: [facility] ORS;  Service: Vascular;  Laterality: Right;   PACEMAKER IMPLANT N/A 10/08/2016   Procedure: Pacemaker Implant;  Surgeon: Marinus Maw, MD;  Location: [facility] INVASIVE CV LAB;  Service: Cardiovascular;  Laterality: N/A;    Family History  Problem Relation Age of Onset   Diabetes Mother    CAD Father    Social History:  reports that she quit smoking about 2 years ago. Her smoking use included cigarettes. She has a 30.00 pack-year smoking history. She has never used smokeless tobacco. She reports that she does not drink alcohol and does not use drugs. (Not in a hospital admission)   Allergies: No Known Allergies  12 systems were reviewed with the patient. All except for the elements noted in the HPI above are negative.   General appearance: alert, cooperative, appears stated age, and no distress Head: Normocephalic, without obvious abnormality, atraumatic Eyes: conjunctivae/corneas clear. PERRL, EOM's intact.  Fundi benign. Throat: lips, mucosa, and tongue normal; teeth and gums normal Neck: no adenopathy, no carotid bruit, no JVD, supple, symmetrical, trachea midline, and thyroid not enlarged, symmetric, no tenderness/mass/nodules Resp:  No increased work of breathing. No wheezes, rales, or rhonchi. No tactile fremitus. Chest wall: no tenderness Cardio: regular rate and rhythm, S1, S2 normal, no murmur, click, rub or gallop and no lateral PMI or thrills. GI: soft, non-tender; bowel sounds normal; no  masses,  no organomegaly Extremities: extremities normal, atraumatic, no cyanosis or edema Pulses: 2+ and symmetric Skin: Skin color, texture, turgor normal. No rashes or lesions Lymph nodes: Cervical, supraclavicular, and axillary nodes normal. Neurologic: Alert and oriented X 3, normal strength and tone. Normal symmetric reflexes. Normal coordination and gait Incision/Wound: None noted.  Results for orders placed or performed during the hospital encounter of 12/12/20 (from the past 48 hour(s))  Basic metabolic panel     Status: Abnormal   Collection Time: 12/12/20  8:28 AM  Result Value Ref Range   Sodium 138 135 - 145 mmol/L   Potassium 3.7 3.5 - 5.1 mmol/L   Chloride 108 98 - 111 mmol/L   CO2 24 22 - 32 mmol/L   Glucose, Bld 136 (H) 70 - 99 mg/dL    Comment: Glucose reference range applies only to samples taken after fasting for at least 8 hours.   BUN 26 (H) 8 - 23 mg/dL   Creatinine, Ser 3.55 (H) 0.44 - 1.00 mg/dL   Calcium 9.4 8.9 - 73.2 mg/dL   GFR, Estimated 38 (L) >60 mL/min    Comment: (NOTE) Calculated using the CKD-EPI Creatinine Equation (2021)    Anion gap 6 5 - 15    Comment: Performed at [facility], [address] [place], [place] [address]  CBC     Status: None   Collection Time: 12/12/20  8:28 AM  Result Value Ref Range   WBC 4.1 4.0 - 10.5 K/uL   RBC 4.73 3.87 - 5.11 MIL/uL   Hemoglobin 13.1 12.0 - 15.0 g/dL   HCT 27.0 62.3 - 76.2 %   MCV 84.8 80.0 - 100.0 fL   MCH 27.7 26.0 - 34.0 pg   MCHC 32.7 30.0 - 36.0 g/dL   RDW 83.1 51.7 - 61.6 %   Platelets 163 150 - 400 K/uL   nRBC 0.0 0.0 - 0.2 %    Comment: Performed at [facility], [address] [place], [place] [address]  Brain natriuretic peptide     Status: None   Collection Time: 12/12/20  8:28 AM  Result Value Ref Range   B Natriuretic Peptide 12.3 0.0 - 100.0 pg/mL    Comment: Performed at [facility], [address] [place], [place] [address]  Resp Panel by  RT-PCR (Flu A&B, Covid) Nasopharyngeal Swab     Status: None   Collection Time: 12/12/20 11:29 AM   Specimen: Nasopharyngeal Swab; Nasopharyngeal(NP) swabs in vial transport medium  Result Value Ref Range   SARS Coronavirus 2 by RT PCR NEGATIVE NEGATIVE    Comment: (NOTE) SARS-CoV-2 target nucleic acids are NOT DETECTED.  The SARS-CoV-2 RNA is generally detectable in upper respiratory specimens during the acute phase of infection. The lowest concentration of SARS-CoV-2 viral copies this assay can detect is 138 copies/mL. A negative result does not preclude SARS-Cov-2 infection and should not be used as the sole basis for treatment or other patient management decisions. A negative result may occur with  improper specimen collection/handling, submission  of specimen other than nasopharyngeal swab, presence of viral mutation(s) within the areas targeted by this assay, and inadequate number of viral copies(<138 copies/mL). A negative result must be combined with clinical observations, patient history, and epidemiological information. The expected result is Negative.  Fact Sheet for Patients:  BloggerCourse.com  Fact Sheet for Healthcare Providers:  SeriousBroker.it  This test is no t yet approved or cleared by the [place] FDA and  has been authorized for detection and/or diagnosis of SARS-CoV-2 by FDA under an Emergency Use Authorization (EUA). This EUA will remain  in effect (meaning this test can be used) for the duration of the COVID-19 declaration under Section 564(b)(1) of the Act, 21 U.S.C.section 360bbb-3(b)(1), unless the authorization is terminated  or revoked sooner.       Influenza A by PCR NEGATIVE NEGATIVE   Influenza B by PCR NEGATIVE NEGATIVE    Comment: (NOTE) The Xpert Xpress SARS-CoV-2/FLU/RSV plus assay is intended as an aid in the diagnosis of influenza from Nasopharyngeal swab specimens and should not be  used as a sole basis for treatment. Nasal washings and aspirates are unacceptable for Xpert Xpress SARS-CoV-2/FLU/RSV testing.  Fact Sheet for Patients: BloggerCourse.com  Fact Sheet for Healthcare Providers: SeriousBroker.it  This test is not yet approved or cleared by the [place] FDA and has been authorized for detection and/or diagnosis of SARS-CoV-2 by FDA under an Emergency Use Authorization (EUA). This EUA will remain in effect (meaning this test can be used) for the duration of the COVID-19 declaration under Section 564(b)(1) of the Act, 21 U.S.C. section 360bbb-3(b)(1), unless the authorization is terminated or revoked.  Performed at [facility], [address] [place], [place] [address]   Troponin I (High Sensitivity)     Status: Abnormal   Collection Time: 12/12/20 12:06 PM  Result Value Ref Range   Troponin I (High Sensitivity) 2,926 (HH) <18 ng/L    Comment: CRITICAL RESULT CALLED TO, READ BACK BY AND VERIFIED WITH JOHN BANDASS 12/12/20 1309 MW/KLW (NOTE) Elevated high sensitivity troponin I (hsTnI) values and significant  changes across serial measurements may suggest ACS but many other  chronic and acute conditions are known to elevate hsTnI results.  Refer to the "Links" section for chest pain algorithms and additional  guidance. Performed at [facility], [address] [place], [place] [address]   Magnesium     Status: None   Collection Time: 12/12/20 12:06 PM  Result Value Ref Range   Magnesium 2.2 1.7 - 2.4 mg/dL    Comment: Performed at [facility], [address] [place], [place] [address]  Troponin I (High Sensitivity)     Status: Abnormal   Collection Time: 12/12/20  2:20 PM  Result Value Ref Range   Troponin I (High Sensitivity) 4,062 (HH) <18 ng/L    Comment: CRITICAL VALUE NOTED. VALUE IS CONSISTENT WITH PREVIOUSLY REPORTED/CALLED VALUE KLW (NOTE) Elevated  high sensitivity troponin I (hsTnI) values and significant  changes across serial measurements may suggest ACS but many other  chronic and acute conditions are known to elevate hsTnI results.  Refer to the "Links" section for chest pain algorithms and additional  guidance. Performed at [facility], [address] [place], [place] [address]   APTT     Status: None   Collection Time: 12/12/20  2:20 PM  Result Value Ref Range   aPTT 31 24 - 36 seconds    Comment: Performed at [facility], [address] [place],  [place] [address]  Protime-INR     Status: None   Collection Time: 12/12/20  2:20 PM  Result Value Ref Range   Prothrombin Time 13.7 11.4 - 15.2 seconds   INR 1.1 0.8 - 1.2    Comment: (NOTE) INR goal varies based on device and disease states. Performed at [facility], [address] [place], [place] [address]   Troponin I (High Sensitivity)     Status: Abnormal   Collection Time: 12/12/20  5:43 PM  Result Value Ref Range   Troponin I (High Sensitivity) 3,313 (HH) <18 ng/L    Comment: CRITICAL VALUE NOTED. VALUE IS CONSISTENT WITH PREVIOUSLY REPORTED/CALLED VALUE SKL (NOTE) Elevated high sensitivity troponin I (hsTnI) values and significant  changes across serial measurements may suggest ACS but many other  chronic and acute conditions are known to elevate hsTnI results.  Refer to the "Links" section for chest pain algorithms and additional  guidance. Performed at [facility], [address] [place], [place] [address]    @[email or web address] @  Blood pressure 127/70, pulse 72, temperature 97.8 F (36.6 C), temperature source Oral, resp. rate 16, height 5\' 5"  (1.651 m), weight 63.5 kg, SpO2 98 %.    Assessment/Plan Hypertension The patient is normotensive on her home dosage of metoprolol. Monitor.  Hypertrophic cardiomyopathy ([name]) Noted. Echocardiogram is being repeated. Cardiology consulted.  LVH (left ventricular  hypertrophy) due to hypertensive disease, without heart failure Noted. Continue antihypertensives. Echocardiogram has been ordered.  NSTEMI (non-ST elevated myocardial infarction) ([name]) With troponins of 2926, 4062, and 3313. Continue to monitor. Cardiology consulted. Unable to evaluate ischemia by EKG due to presence of RBBB. This was present on previous EKG.  RBBB Noted. Present on previous EKG's. Unable to evaluate ischemia from EKG due to this finding.  Type 2 diabetes mellitus with vascular disease ([name]) Check HbA1c. It appears that the patient's glucoses are covered with farxiga alone at home. Will follow FSBS with [facility].  I have seen and examined this patient myself. I have spent 65 minutes in her evaluation and care.  Shannon Chen 12/12/2020, 8:26 PM

## 2020-12-12 NOTE — Assessment & Plan Note (Signed)
With troponins of 2926, 4062, and 3313. Continue to monitor. Cardiology consulted. Unable to evaluate ischemia by EKG due to presence of RBBB. This was present on previous EKG.

## 2020-12-12 NOTE — H&P (View-Only) (Signed)
Cardiology Consultation:   Patient ID: Shannon Chen MRN: [id number]; DOB: 03/27/1945  Admit date: 12/12/2020 Date of Consult: 12/12/2020  PCP:  Armando Gang, FNP   [facility] Providers Cardiologist:  Julien Nordmann, MD  Electrophysiologist:  Sherryl Manges, MD       Patient Profile:   Shannon Chen is a 75 y.o. female with a hx of hypertension, HCM, former smoker who is being seen 12/12/2020 for the evaluation of dizziness at the request of Dr. Gerri Lins.  History of Present Illness:   [name] is a 75 year old female with history of hypertension, HCM, Mobitz 2 heart block s/p PPM, HTN, former smoker x30+ years, carotid stenosis s/p CEA presenting with dizziness and diaphoresis.  Patient lives in a skilled nursing facility.  She was in her kitchen making breakfast when she suddenly felt dizzy and sweaty.  She denies chest pain, shortness of breath, palpitations.  She sat down for a few minutes but symptoms persisted.  One of the nursing aide noticed heart and call EMS.  In the ED, initial EKG showed sinus rhythm with right bundle branch block, diffuse T wave inversions unchanged from prior.  Initial troponins were 2926, 4062, 3313.  Patient started on aspirin and heparin drip per ACS protocol.   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 09/2016 s/p MDT [id number] Azure XT DR MRI DC PPM   Carotid artery disease ([name])    a. 09/2016 Carotid U/S: RICA 70-99%, LICA 50-69%;  b. 09/2016 CTA Neck: RICA 60, RCCA 30, LICA 70%, L Vert 60, L Basilar 70; c. 10/2019 s/p R CEA.   Essential hypertension    History of stress test    a. 12/2016 MV: EF 51%, no ischemia/infarct; b. 10/2019 MV: EF 61%, no ischemia/infarct.   Hyperlipidemia    Hypertrophic cardiomyopathy ([name])    a. 09/2016 Echo: EF 60-65%, no rwma, Gr1 DD, Ca2+ MV annulus; b. 11/2017 Echo: EF 70-75%, no rwma, Gr1 DD, mild AS vs dynamic LVOT obs. Nl RV fxn. Hyperdynamic LVEF w/ sev LVH; c.  10/2019 Echo: EF 70-75%, no rwma, gr1 DD, sev LVH, Gr1 DD, nl PASP, triv MR, mild-mod Ao sclerosis; c. 05/2020 Echo: EF 60-65%, no rwma, sev conc LVH, gr1 DD, nl RV fxn. RVSP 21.86mmHg. Mildly dil LA.   Presence of permanent cardiac pacemaker     Past Surgical History:  Procedure Laterality Date   ABDOMINAL HYSTERECTOMY     ENDARTERECTOMY Right 11/16/2019   Procedure: ENDARTERECTOMY CAROTID;  Surgeon: Renford Dills, MD;  Location: [facility] ORS;  Service: Vascular;  Laterality: Right;   PACEMAKER IMPLANT N/A 10/08/2016   Procedure: Pacemaker Implant;  Surgeon: Marinus Maw, MD;  Location: [facility];  Service: Cardiovascular;  Laterality: N/A;     Home Medications:  Prior to Admission medications   Medication Sig Start Date End Date Taking? Authorizing Provider  amLODipine (NORVASC) 2.5 MG tablet Take 1 tablet (2.5 mg total) by mouth daily. 10/30/20 01/28/21 Yes Duke Salvia, MD  aspirin EC 81 MG EC tablet Take 1 tablet (81 mg total) by mouth daily. Swallow whole. 11/09/19  Yes Wouk, Wilfred Curtis, MD  dapagliflozin propanediol (FARXIGA) 10 MG TABS tablet Take 10 mg by mouth daily.   Yes [provider]  Finerenone (KERENDIA) 20 MG TABS Take 20 mg by mouth daily.   Yes [provider]  metoprolol succinate (TOPROL-XL) 25 MG 24 hr tablet Take  25 mg by mouth daily. 12/04/20  Yes [provider]  mirtazapine (REMERON) 7.5 MG tablet Take 7.5 mg by mouth at bedtime. 12/04/20  Yes [provider]  potassium chloride (KLOR-CON) 10 MEQ tablet Take 10 mEq by mouth 2 (two) times daily.   Yes [provider]  rosuvastatin (CRESTOR) 40 MG tablet TAKE 1 TABLET BY MOUTH DAILY 12/04/20  Yes Antonieta Iba, MD  TRELEGY ELLIPTA 200-62.5-25 MCG/ACT AEPB Inhale 1 puff into the lungs daily. 12/07/20  Yes [provider]  Vibegron (GEMTESA) 75 MG TABS Take 75 mg by mouth daily.   Yes [provider]  acetaminophen (TYLENOL) 325 MG tablet  Take 650 mg by mouth every 4 (four) hours as needed.    [provider]  albuterol (VENTOLIN HFA) 108 (90 Base) MCG/ACT inhaler Inhale 1-2 puffs into the lungs every 6 (six) hours as needed for wheezing or shortness of breath. 11/13/20   [provider]  busPIRone (BUSPAR) 7.5 MG tablet Take 7.5 mg by mouth daily.  Patient not taking: No sig reported 09/07/19   [provider]  loperamide (IMODIUM) 2 MG capsule Take 2 mg by mouth as needed. 12/07/19   [provider]  metoprolol tartrate (LOPRESSOR) 25 MG tablet Take 25 mg by mouth 2 (two) times daily. Patient not taking: Reported on 12/12/2020    [provider]  mirtazapine (REMERON) 15 MG tablet Take 15 mg by mouth at bedtime. Patient not taking: Reported on 12/12/2020    [provider]  nitrofurantoin (MACRODANTIN) 100 MG capsule Take 100 mg by mouth 4 (four) times daily. Patient not taking: Reported on 12/12/2020    [provider]  polyethylene glycol (MIRALAX) 17 g packet Take 17 g by mouth daily as needed for mild constipation. 11/28/19   Alver Sorrow, NP  traMADol (ULTRAM) 50 MG tablet Take 50 mg by mouth every 6 (six) hours as needed. 04/23/20   [provider]    Inpatient Medications: Scheduled Meds:  aspirin  324 mg Oral NOW   Or   aspirin  300 mg Rectal NOW   [START ON 12/13/2020] aspirin EC  81 mg Oral Daily   metoprolol succinate  25 mg Oral Daily   mirtazapine  7.5 mg Oral QHS   rosuvastatin  40 mg Oral Daily   Vibegron  75 mg Oral Daily   Continuous Infusions:  heparin 750 Units/hr (12/12/20 1452)   PRN Meds: acetaminophen, nitroGLYCERIN, ondansetron (ZOFRAN) IV, traMADol  Allergies:   No Known Allergies  Social History:   Social History   Socioeconomic History   Marital status: Single    Spouse name: Not on file   Number of children: Not on file   Years of education: Not on file   Highest education level: Not on file  Occupational  History   Not on file  Tobacco Use   Smoking status: Former    Packs/day: 1.00    Years: 30.00    Pack years: 30.00    Types: Cigarettes    Quit date: 11/09/2018    Years since quitting: 2.0   Smokeless tobacco: Never  Vaping Use   Vaping Use: Never used  Substance and Sexual Activity   Alcohol use: No   Drug use: No   Sexual activity: Not on file  Other Topics Concern   Not on file  Social History Narrative   ** Merged History Encounter **       Independent at baseline Ambulates without  any assistance. Lives by herself   Social Determinants of Health   Financial Resource Strain: Not on file  Food Insecurity: Not on file  Transportation Needs: Not on file  Physical Activity: Not on file  Stress: Not on file  Social Connections: Not on file  Intimate Partner Violence: Not on file    Family History:    Family History  Problem Relation Age of Onset   Diabetes Mother    CAD Father      ROS:  Please see the history of present illness.   All other ROS reviewed and negative.     Physical Exam/Data:   Vitals:   12/12/20 0821 12/12/20 0824 12/12/20 1259  BP:  100/61 127/70  Pulse:  65 72  Resp:  16 16  Temp:  97.8 F (36.6 C) 97.8 F (36.6 C)  TempSrc:  Oral Oral  SpO2:  97% 98%  Weight: 63.5 kg    Height: 5\' 5"  (1.651 m)     No intake or output data in the 24 hours ending 12/12/20 1957 Last 3 Weights 12/12/2020 10/30/2020 10/08/2020  Weight (lbs) 140 lb 143 lb 140 lb 8 oz  Weight (kg) 63.504 kg 64.864 kg 63.73 kg     Body mass index is 23.3 kg/m.  General:  Well nourished, well developed, in no acute distress HEENT: normal Neck: no JVD Vascular: No carotid bruits; Distal pulses 2+ bilaterally Cardiac:  normal S1, S2; RRR; 2/6 systolic murmur Lungs:  clear to auscultation bilaterally, no wheezing, rhonchi or rales  Abd: soft, nontender, no hepatomegaly  Ext: no edema Musculoskeletal:  No deformities, BUE and BLE strength normal and equal Skin: warm  and dry  Neuro:  CNs 2-12 intact, no focal abnormalities noted Psych:  Normal affect   EKG:  The EKG was personally reviewed and demonstrates: Sinus rhythm, right bundle branch block Telemetry:  Telemetry was personally reviewed and demonstrates: Sinus rhythm  Relevant CV Studies: Echo 05/2020 1. Left ventricular ejection fraction, by estimation, is 60 to 65%. The  left ventricle has normal function. The left ventricle has no regional  wall motion abnormalities. There is severe concentric left ventricular  hypertrophy, notably around the mid to  distal LV walls and apical region. Left ventricular diastolic parameters  are consistent with Grade I diastolic dysfunction (impaired relaxation).   2. Right ventricular systolic function is normal. The right ventricular  size is normal. There is normal pulmonary artery systolic pressure. The  estimated right ventricular systolic pressure is 21.8 mmHg.   3. Left atrial size was mildly dilated.   Laboratory Data:  High Sensitivity Troponin:   Recent Labs  Lab 12/12/20 1206 12/12/20 1420 12/12/20 1743  TROPONINIHS 2,926* 4,062* 3,313*     Chemistry Recent Labs  Lab 12/12/20 0828 12/12/20 1206  NA 138  --   K 3.7  --   CL 108  --   CO2 24  --   GLUCOSE 136*  --   BUN 26*  --   CREATININE 1.41*  --   CALCIUM 9.4  --   MG  --  2.2  GFRNONAA 38*  --   ANIONGAP 6  --     No results for input(s): PROT, ALBUMIN, AST, ALT, ALKPHOS, BILITOT in the last 168 hours. Lipids No results for input(s): CHOL, TRIG, HDL, LABVLDL, LDLCALC, CHOLHDL in the last 168 hours.  Hematology Recent Labs  Lab 12/12/20 0828  WBC 4.1  RBC 4.73  HGB 13.1  HCT 40.1  MCV  84.8  MCH 27.7  MCHC 32.7  RDW 14.0  PLT 163   Thyroid No results for input(s): TSH, FREET4 in the last 168 hours.  BNP Recent Labs  Lab 12/12/20 0828  BNP 12.3    DDimer No results for input(s): DDIMER in the last 168 hours.   Radiology/Studies:  DG Chest 2 View  Result  Date: 12/12/2020 CLINICAL DATA:  Near-syncope EXAM: CHEST - 2 VIEW COMPARISON:  Chest radiograph 05/18/2020 FINDINGS: The left chest wall cardiac device and associated leads are stable. The cardiomediastinal silhouette is stable, with unchanged calcified atherosclerotic plaque of the aortic arch. There is no focal consolidation or pulmonary edema. There is no pleural effusion or pneumothorax. There is no acute osseous abnormality. IMPRESSION: Stable chest with no radiographic evidence of acute cardiopulmonary process. Electronically Signed   By: Lesia Hausen M.D.   On: 12/12/2020 13:05   CT HEAD WO CONTRAST ( )  Result Date: 12/12/2020 CLINICAL DATA:  Weakness, neuro deficit EXAM: CT HEAD WITHOUT CONTRAST TECHNIQUE: Contiguous axial images were obtained from the base of the skull through the vertex without intravenous contrast. COMPARISON:  CT head 06/09/2020 FINDINGS: Brain: There is no evidence of acute intracranial hemorrhage, extra-axial fluid collection, or acute infarct. There is unchanged global parenchymal volume loss with enlargement of the ventricular system. The ventricles are stable in size. 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 Additional confluent hypodensity throughout the remainder of the subcortical and periventricular white matter is unchanged, again likely reflecting sequela of advanced chronic white matter microangiopathy. There is no mass lesion.  There is no midline shift. Vascular: There is calcification of the bilateral cavernous ICAs and vertebral arteries. Skull: Normal. Negative for fracture or focal lesion. Sinuses/Orbits: There is mild mucosal thickening in the paranasal sinuses. The globes and orbits are unremarkable. Other: There is a 1.6 cm by 1.0 cm hypodense lesion in the soft tissues of the right cheek, present since at least 2018 which may reflect a sebaceous cyst. IMPRESSION: 1. No acute intracranial pathology. 2. Unchanged  global parenchymal volume loss, chronic white matter microangiopathy, and remote lacunar infarcts in the bilateral basal ganglia. 3. 1.6 cm hypodense lesion in the soft tissues of the right cheek has been present since at least 2018 and may reflect a sebaceous cyst. Correlate with exam. Electronically Signed   By: Lesia Hausen M.D.   On: 12/12/2020 13:10     Assessment and Plan:   NSTEMI -Troponins peaked at 4062 -Aspirin, heparin drip -Toprol-XL, Crestor. -N.p.o. midnight, obtain echocardiogram -Plan for left heart cath tomorrow if schedule permits.  2.  Mobitz 2 AV block s/p permanent pacemaker -Consider pacemaker interrogation for possible arrhythmias due to dizziness and hx of HCM.  3.  Severe LVH concentric -Echo as above -Toprol-XL  4.  PAD, carotid artery stenosis s/p CEA -Aspirin, Crestor   Total encounter time 110 minutes  Greater than 50% was spent in counseling and coordination of care with the patient   Signed, Debbe Odea, MD  12/12/2020 7:57 PM

## 2020-12-12 NOTE — Assessment & Plan Note (Addendum)
Check HbA1c. It appears that the patient's glucoses are covered with farxiga alone at home. Will follow FSBS with [employer].

## 2020-12-12 NOTE — ED Notes (Signed)
Pt to xray

## 2020-12-12 NOTE — ED Triage Notes (Signed)
Pt comes into the ED via EMS from care home [facility], states she was getting ready to cook breakfast for everyone  and had sudden onset diaphoresis and feeling weak.. pt is a/ox4 on arrival  CBg 129 HR 66 96%RA 142/62 97.8 temp

## 2020-12-12 NOTE — Assessment & Plan Note (Signed)
Noted. Present on previous EKG's. Unable to evaluate ischemia from EKG due to this finding.

## 2020-12-12 NOTE — Consult Note (Signed)
ANTICOAGULATION CONSULT NOTE - Initial Consult  Pharmacy Consult for heparin infusion Indication: chest pain/ACS  No Known Allergies  Patient Measurements: Height: 5\' 5"  (165.1 cm) Weight: 63.5 kg (140 lb) IBW/kg (Calculated) : 57 Heparin Dosing Weight: 63.5 kg  Vital Signs: Temp: 97.8 F (36.6 C) (10/26 1259) Temp Source: Oral (10/26 1259) BP: 127/70 (10/26 1259) Pulse Rate: 72 (10/26 1259)  Labs: Recent Labs    12/12/20 0828 12/12/20 1206  HGB 13.1  --   HCT 40.1  --   PLT 163  --   CREATININE 1.41*  --   TROPONINIHS  --  2,926*    Estimated Creatinine Clearance: 30.1 mL/min (A) (by C-G formula based on SCr of 1.41 mg/dL (H)).   Medical History: Past Medical History:  Diagnosis Date   Arthritis    AV block, Mobitz II    a. 09/2016 s/p MDT 10/2016 Azure XT DR MRI DC PPM   Carotid artery disease ([name])    a. 09/2016 Carotid U/S: RICA 70-99%, LICA 50-69%;  b. 09/2016 CTA Neck: RICA 60, RCCA 30, LICA 70%, L Vert 60, L Basilar 70; c. 10/2019 s/p R CEA.   Essential hypertension    History of stress test    a. 12/2016 MV: EF 51%, no ischemia/infarct; b. 10/2019 MV: EF 61%, no ischemia/infarct.   Hyperlipidemia    Hypertrophic cardiomyopathy ([name])    a. 09/2016 Echo: EF 60-65%, no rwma, Gr1 DD, Ca2+ MV annulus; b. 11/2017 Echo: EF 70-75%, no rwma, Gr1 DD, mild AS vs dynamic LVOT obs. Nl RV fxn. Hyperdynamic LVEF w/ sev LVH; c. 10/2019 Echo: EF 70-75%, no rwma, gr1 DD, sev LVH, Gr1 DD, nl PASP, triv MR, mild-mod Ao sclerosis; c. 05/2020 Echo: EF 60-65%, no rwma, sev conc LVH, gr1 DD, nl RV fxn. RVSP 21.67mmHg. Mildly dil LA.   Presence of permanent cardiac pacemaker     Medications:  No prior AC noted   Assessment: 75 y.o. female with past medical history of carotid artery disease, hypertrophic cardiomyopathy with pacemaker, presents with an episode of lightheadedness dizziness and diaphoresis. Troponin 2900>4000. Pharmacy has been consulted for heparin infusion.  Goal of  Therapy:  Heparin level 0.3-0.7 units/ml Monitor platelets by anticoagulation protocol: Yes   Plan:  Give 3800 units bolus x 1 Start heparin infusion at 750 units/hr Check anti-Xa level in 8 hours and daily while on heparin Continue to monitor H&H and platelets  62, PharmD, BCPS Clinical Pharmacist   12/12/2020,1:55 PM

## 2020-12-12 NOTE — ED Notes (Signed)
Pt sitting up in bed eating, watching tv, denies any pain, denies any needs. Call bell in reach.

## 2020-12-12 NOTE — Consult Note (Signed)
Cardiology Consultation:   Patient ID: Shannon Chen MRN: [id number]; DOB: 03/27/1945  Admit date: 12/12/2020 Date of Consult: 12/12/2020  PCP:  Armando Gang, FNP   [facility] Providers Cardiologist:  Julien Nordmann, MD  Electrophysiologist:  Sherryl Manges, MD       Patient Profile:   Shannon Chen is a 75 y.o. female with a hx of hypertension, HCM, former smoker who is being seen 12/12/2020 for the evaluation of dizziness at the request of Dr. Gerri Lins.  History of Present Illness:   Shannon Chen is a 75 year old female with history of hypertension, HCM, Mobitz 2 heart block s/p PPM, HTN, former smoker x30+ years, carotid stenosis s/p CEA presenting with dizziness and diaphoresis.  Patient lives in a skilled nursing facility.  She was in her kitchen making breakfast when she suddenly felt dizzy and sweaty.  She denies chest pain, shortness of breath, palpitations.  She sat down for a few minutes but symptoms persisted.  One of the nursing aide noticed heart and call EMS.  In the ED, initial EKG showed sinus rhythm with right bundle branch block, diffuse T wave inversions unchanged from prior.  Initial troponins were 2926, 4062, 3313.  Patient started on aspirin and heparin drip per ACS protocol.   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 09/2016 s/p MDT [id number] Azure XT DR MRI DC PPM   Carotid artery disease ([name])    a. 09/2016 Carotid U/S: RICA 70-99%, LICA 50-69%;  b. 09/2016 CTA Neck: RICA 60, RCCA 30, LICA 70%, L Vert 60, L Basilar 70; c. 10/2019 s/p R CEA.   Essential hypertension    History of stress test    a. 12/2016 MV: EF 51%, no ischemia/infarct; b. 10/2019 MV: EF 61%, no ischemia/infarct.   Hyperlipidemia    Hypertrophic cardiomyopathy ([name])    a. 09/2016 Echo: EF 60-65%, no rwma, Gr1 DD, Ca2+ MV annulus; b. 11/2017 Echo: EF 70-75%, no rwma, Gr1 DD, mild AS vs dynamic LVOT obs. Nl RV fxn. Hyperdynamic LVEF w/ sev LVH; c.  10/2019 Echo: EF 70-75%, no rwma, gr1 DD, sev LVH, Gr1 DD, nl PASP, triv MR, mild-mod Ao sclerosis; c. 05/2020 Echo: EF 60-65%, no rwma, sev conc LVH, gr1 DD, nl RV fxn. RVSP 21.86mmHg. Mildly dil LA.   Presence of permanent cardiac pacemaker     Past Surgical History:  Procedure Laterality Date   ABDOMINAL HYSTERECTOMY     ENDARTERECTOMY Right 11/16/2019   Procedure: ENDARTERECTOMY CAROTID;  Surgeon: Renford Dills, MD;  Location: [facility] ORS;  Service: Vascular;  Laterality: Right;   PACEMAKER IMPLANT N/A 10/08/2016   Procedure: Pacemaker Implant;  Surgeon: Marinus Maw, MD;  Location: [facility];  Service: Cardiovascular;  Laterality: N/A;     Home Medications:  Prior to Admission medications   Medication Sig Start Date End Date Taking? Authorizing Provider  amLODipine (NORVASC) 2.5 MG tablet Take 1 tablet (2.5 mg total) by mouth daily. 10/30/20 01/28/21 Yes Duke Salvia, MD  aspirin EC 81 MG EC tablet Take 1 tablet (81 mg total) by mouth daily. Swallow whole. 11/09/19  Yes Wouk, Wilfred Curtis, MD  dapagliflozin propanediol (FARXIGA) 10 MG TABS tablet Take 10 mg by mouth daily.   Yes [provider]  Finerenone (KERENDIA) 20 MG TABS Take 20 mg by mouth daily.   Yes [provider]  metoprolol succinate (TOPROL-XL) 25 MG 24 hr tablet Take  25 mg by mouth daily. 12/04/20  Yes [provider]  mirtazapine (REMERON) 7.5 MG tablet Take 7.5 mg by mouth at bedtime. 12/04/20  Yes [provider]  potassium chloride (KLOR-CON) 10 MEQ tablet Take 10 mEq by mouth 2 (two) times daily.   Yes [provider]  rosuvastatin (CRESTOR) 40 MG tablet TAKE 1 TABLET BY MOUTH DAILY 12/04/20  Yes Antonieta Iba, MD  TRELEGY ELLIPTA 200-62.5-25 MCG/ACT AEPB Inhale 1 puff into the lungs daily. 12/07/20  Yes [provider]  Vibegron (GEMTESA) 75 MG TABS Take 75 mg by mouth daily.   Yes [provider]  acetaminophen (TYLENOL) 325 MG tablet  Take 650 mg by mouth every 4 (four) hours as needed.    [provider]  albuterol (VENTOLIN HFA) 108 (90 Base) MCG/ACT inhaler Inhale 1-2 puffs into the lungs every 6 (six) hours as needed for wheezing or shortness of breath. 11/13/20   [provider]  busPIRone (BUSPAR) 7.5 MG tablet Take 7.5 mg by mouth daily.  Patient not taking: No sig reported 09/07/19   [provider]  loperamide (IMODIUM) 2 MG capsule Take 2 mg by mouth as needed. 12/07/19   [provider]  metoprolol tartrate (LOPRESSOR) 25 MG tablet Take 25 mg by mouth 2 (two) times daily. Patient not taking: Reported on 12/12/2020    [provider]  mirtazapine (REMERON) 15 MG tablet Take 15 mg by mouth at bedtime. Patient not taking: Reported on 12/12/2020    [provider]  nitrofurantoin (MACRODANTIN) 100 MG capsule Take 100 mg by mouth 4 (four) times daily. Patient not taking: Reported on 12/12/2020    [provider]  polyethylene glycol (MIRALAX) 17 g packet Take 17 g by mouth daily as needed for mild constipation. 11/28/19   Alver Sorrow, NP  traMADol (ULTRAM) 50 MG tablet Take 50 mg by mouth every 6 (six) hours as needed. 04/23/20   [provider]    Inpatient Medications: Scheduled Meds:  aspirin  324 mg Oral NOW   Or   aspirin  300 mg Rectal NOW   [START ON 12/13/2020] aspirin EC  81 mg Oral Daily   metoprolol succinate  25 mg Oral Daily   mirtazapine  7.5 mg Oral QHS   rosuvastatin  40 mg Oral Daily   Vibegron  75 mg Oral Daily   Continuous Infusions:  heparin 750 Units/hr (12/12/20 1452)   PRN Meds: acetaminophen, nitroGLYCERIN, ondansetron (ZOFRAN) IV, traMADol  Allergies:   No Known Allergies  Social History:   Social History   Socioeconomic History   Marital status: Single    Spouse name: Not on file   Number of children: Not on file   Years of education: Not on file   Highest education level: Not on file  Occupational  History   Not on file  Tobacco Use   Smoking status: Former    Packs/day: 1.00    Years: 30.00    Pack years: 30.00    Types: Cigarettes    Quit date: 11/09/2018    Years since quitting: 2.0   Smokeless tobacco: Never  Vaping Use   Vaping Use: Never used  Substance and Sexual Activity   Alcohol use: No   Drug use: No   Sexual activity: Not on file  Other Topics Concern   Not on file  Social History Narrative   ** Merged History Encounter **       Independent at baseline Ambulates without  any assistance. Lives by herself   Social Determinants of Health   Financial Resource Strain: Not on file  Food Insecurity: Not on file  Transportation Needs: Not on file  Physical Activity: Not on file  Stress: Not on file  Social Connections: Not on file  Intimate Partner Violence: Not on file    Family History:    Family History  Problem Relation Age of Onset   Diabetes Mother    CAD Father      ROS:  Please see the history of present illness.   All other ROS reviewed and negative.     Physical Exam/Data:   Vitals:   12/12/20 0821 12/12/20 0824 12/12/20 1259  BP:  100/61 127/70  Pulse:  65 72  Resp:  16 16  Temp:  97.8 F (36.6 C) 97.8 F (36.6 C)  TempSrc:  Oral Oral  SpO2:  97% 98%  Weight: 63.5 kg    Height: 5\' 5"  (1.651 m)     No intake or output data in the 24 hours ending 12/12/20 1957 Last 3 Weights 12/12/2020 10/30/2020 10/08/2020  Weight (lbs) 140 lb 143 lb 140 lb 8 oz  Weight (kg) 63.504 kg 64.864 kg 63.73 kg     Body mass index is 23.3 kg/m.  General:  Well nourished, well developed, in no acute distress HEENT: normal Neck: no JVD Vascular: No carotid bruits; Distal pulses 2+ bilaterally Cardiac:  normal S1, S2; RRR; 2/6 systolic murmur Lungs:  clear to auscultation bilaterally, no wheezing, rhonchi or rales  Abd: soft, nontender, no hepatomegaly  Ext: no edema Musculoskeletal:  No deformities, BUE and BLE strength normal and equal Skin: warm  and dry  Neuro:  CNs 2-12 intact, no focal abnormalities noted Psych:  Normal affect   EKG:  The EKG was personally reviewed and demonstrates: Sinus rhythm, right bundle branch block Telemetry:  Telemetry was personally reviewed and demonstrates: Sinus rhythm  Relevant CV Studies: Echo 05/2020 1. Left ventricular ejection fraction, by estimation, is 60 to 65%. The  left ventricle has normal function. The left ventricle has no regional  wall motion abnormalities. There is severe concentric left ventricular  hypertrophy, notably around the mid to  distal LV walls and apical region. Left ventricular diastolic parameters  are consistent with Grade I diastolic dysfunction (impaired relaxation).   2. Right ventricular systolic function is normal. The right ventricular  size is normal. There is normal pulmonary artery systolic pressure. The  estimated right ventricular systolic pressure is 21.8 mmHg.   3. Left atrial size was mildly dilated.   Laboratory Data:  High Sensitivity Troponin:   Recent Labs  Lab 12/12/20 1206 12/12/20 1420 12/12/20 1743  TROPONINIHS 2,926* 4,062* 3,313*     Chemistry Recent Labs  Lab 12/12/20 0828 12/12/20 1206  NA 138  --   K 3.7  --   CL 108  --   CO2 24  --   GLUCOSE 136*  --   BUN 26*  --   CREATININE 1.41*  --   CALCIUM 9.4  --   MG  --  2.2  GFRNONAA 38*  --   ANIONGAP 6  --     No results for input(s): PROT, ALBUMIN, AST, ALT, ALKPHOS, BILITOT in the last 168 hours. Lipids No results for input(s): CHOL, TRIG, HDL, LABVLDL, LDLCALC, CHOLHDL in the last 168 hours.  Hematology Recent Labs  Lab 12/12/20 0828  WBC 4.1  RBC 4.73  HGB 13.1  HCT 40.1  MCV  84.8  MCH 27.7  MCHC 32.7  RDW 14.0  PLT 163   Thyroid No results for input(s): TSH, FREET4 in the last 168 hours.  BNP Recent Labs  Lab 12/12/20 0828  BNP 12.3    DDimer No results for input(s): DDIMER in the last 168 hours.   Radiology/Studies:  DG Chest 2 View  Result  Date: 12/12/2020 CLINICAL DATA:  Near-syncope EXAM: CHEST - 2 VIEW COMPARISON:  Chest radiograph 05/18/2020 FINDINGS: The left chest wall cardiac device and associated leads are stable. The cardiomediastinal silhouette is stable, with unchanged calcified atherosclerotic plaque of the aortic arch. There is no focal consolidation or pulmonary edema. There is no pleural effusion or pneumothorax. There is no acute osseous abnormality. IMPRESSION: Stable chest with no radiographic evidence of acute cardiopulmonary process. Electronically Signed   By: Peter  Noone M.D.   On: 12/12/2020 13:05   CT HEAD WO CONTRAST (5MM)  Result Date: 12/12/2020 CLINICAL DATA:  Weakness, neuro deficit EXAM: CT HEAD WITHOUT CONTRAST TECHNIQUE: Contiguous axial images were obtained from the base of the skull through the vertex without intravenous contrast. COMPARISON:  CT head 06/09/2020 FINDINGS: Brain: There is no evidence of acute intracranial hemorrhage, extra-axial fluid collection, or acute infarct. There is unchanged global parenchymal volume loss with enlargement of the ventricular system. The ventricles are stable in size. 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 Additional confluent hypodensity throughout the remainder of the subcortical and periventricular white matter is unchanged, again likely reflecting sequela of advanced chronic white matter microangiopathy. There is no mass lesion.  There is no midline shift. Vascular: There is calcification of the bilateral cavernous ICAs and vertebral arteries. Skull: Normal. Negative for fracture or focal lesion. Sinuses/Orbits: There is mild mucosal thickening in the paranasal sinuses. The globes and orbits are unremarkable. Other: There is a 1.6 cm by 1.0 cm hypodense lesion in the soft tissues of the right cheek, present since at least 2018 which may reflect a sebaceous cyst. IMPRESSION: 1. No acute intracranial pathology. 2. Unchanged  global parenchymal volume loss, chronic white matter microangiopathy, and remote lacunar infarcts in the bilateral basal ganglia. 3. 1.6 cm hypodense lesion in the soft tissues of the right cheek has been present since at least 2018 and may reflect a sebaceous cyst. Correlate with exam. Electronically Signed   By: Peter  Noone M.D.   On: 12/12/2020 13:10     Assessment and Plan:   NSTEMI -Troponins peaked at 4062 -Aspirin, heparin drip -Toprol-XL, Crestor. -N.p.o. midnight, obtain echocardiogram -Plan for left heart cath tomorrow if schedule permits.  2.  Mobitz 2 AV block s/p permanent pacemaker -Consider pacemaker interrogation for possible arrhythmias due to dizziness and hx of HCM.  3.  Severe LVH concentric -Echo as above -Toprol-XL  4.  PAD, carotid artery stenosis s/p CEA -Aspirin, Crestor   Total encounter time 110 minutes  Greater than 50% was spent in counseling and coordination of care with the patient   Signed, Ercole Georg Agbor-Etang, MD  12/12/2020 7:57 PM  

## 2020-12-12 NOTE — ED Notes (Signed)
Patient given TV remote at this time. 

## 2020-12-12 NOTE — Assessment & Plan Note (Signed)
Noted. Continue antihypertensives. Echocardiogram has been ordered.

## 2020-12-13 ENCOUNTER — Encounter
Admission: EM | Disposition: A | Discharge status: Home / Self Care | Payer: Self-pay | Source: Home / Self Care | Attending: Student

## 2020-12-13 DIAGNOSIS — I214 Non-ST elevation (NSTEMI) myocardial infarction: Secondary | ICD-10-CM | POA: Diagnosis not present

## 2020-12-13 DIAGNOSIS — I422 Other hypertrophic cardiomyopathy: Secondary | ICD-10-CM

## 2020-12-13 DIAGNOSIS — R55 Syncope and collapse: Secondary | ICD-10-CM | POA: Diagnosis not present

## 2020-12-13 DIAGNOSIS — E1159 Type 2 diabetes mellitus with other circulatory complications: Secondary | ICD-10-CM

## 2020-12-13 DIAGNOSIS — I119 Hypertensive heart disease without heart failure: Secondary | ICD-10-CM | POA: Diagnosis not present

## 2020-12-13 DIAGNOSIS — I2510 Atherosclerotic heart disease of native coronary artery without angina pectoris: Secondary | ICD-10-CM

## 2020-12-13 DIAGNOSIS — I10 Essential (primary) hypertension: Secondary | ICD-10-CM | POA: Diagnosis not present

## 2020-12-13 HISTORY — PX: LEFT HEART CATH AND CORONARY ANGIOGRAPHY: CATH118249

## 2020-12-13 HISTORY — PX: INTRAVASCULAR PRESSURE WIRE/FFR STUDY: CATH118243

## 2020-12-13 LAB — HEPARIN LEVEL (UNFRACTIONATED): Heparin Unfractionated: 0.33 IU/mL (ref 0.30–0.70)

## 2020-12-13 LAB — GLUCOSE, CAPILLARY
Glucose-Capillary: 81 mg/dL (ref 70–99)
Glucose-Capillary: 82 mg/dL (ref 70–99)
Glucose-Capillary: 126 mg/dL — ABNORMAL HIGH (ref 70–99)

## 2020-12-13 LAB — HEMOGLOBIN A1C
Hgb A1c MFr Bld: 6.7 % — ABNORMAL HIGH (ref 4.8–5.6)
Mean Plasma Glucose: 145.59 mg/dL

## 2020-12-13 LAB — LIPID PANEL
Cholesterol: 127 mg/dL (ref 0–200)
HDL: 57 mg/dL (ref >40)
LDL Cholesterol: 61 mg/dL (ref 0–99)
Total CHOL/HDL Ratio: 2.2 RATIO
Triglycerides: 43 mg/dL (ref <150)
VLDL: 9 mg/dL (ref 0–40)

## 2020-12-13 LAB — CBC
HCT: 38.2 % (ref 36.0–46.0)
Hemoglobin: 12.1 g/dL (ref 12.0–15.0)
MCH: 26.5 pg (ref 26.0–34.0)
MCHC: 31.7 g/dL (ref 30.0–36.0)
MCV: 83.8 fL (ref 80.0–100.0)
Platelets: 156 10*3/uL (ref 150–400)
RBC: 4.56 MIL/uL (ref 3.87–5.11)
RDW: 13.9 % (ref 11.5–15.5)
WBC: 3.9 10*3/uL — ABNORMAL LOW (ref 4.0–10.5)
nRBC: 0 % (ref 0.0–0.2)

## 2020-12-13 LAB — BASIC METABOLIC PANEL
Anion gap: 3 — ABNORMAL LOW (ref 5–15)
BUN: 29 mg/dL — ABNORMAL HIGH (ref 8–23)
CO2: 26 mmol/L (ref 22–32)
Calcium: 9.2 mg/dL (ref 8.9–10.3)
Chloride: 111 mmol/L (ref 98–111)
Creatinine, Ser: 1.24 mg/dL — ABNORMAL HIGH (ref 0.44–1.00)
GFR, Estimated: 45 mL/min — ABNORMAL LOW (ref >60)
Glucose, Bld: 107 mg/dL — ABNORMAL HIGH (ref 70–99)
Potassium: 4.2 mmol/L (ref 3.5–5.1)
Sodium: 140 mmol/L (ref 135–145)

## 2020-12-13 LAB — ECHOCARDIOGRAM COMPLETE
AR max vel: 2.83 cm2
AV Area VTI: 2.74 cm2
AV Area mean vel: 3.37 cm2
AV Mean grad: 6 mmHg
AV Peak grad: 12.4 mmHg
Ao pk vel: 1.76 m/s
Area-P 1/2: 2.54 cm2
Height: 65 in
S' Lateral: 2 cm
Weight: 2240 oz

## 2020-12-13 LAB — POCT ACTIVATED CLOTTING TIME: Activated Clotting Time: 271 seconds

## 2020-12-13 LAB — CBG MONITORING, ED
Glucose-Capillary: 99 mg/dL (ref 70–99)
Glucose-Capillary: 87 mg/dL (ref 70–99)

## 2020-12-13 LAB — PHOSPHORUS: Phosphorus: 4.6 mg/dL (ref 2.5–4.6)

## 2020-12-13 LAB — TROPONIN I (HIGH SENSITIVITY): Troponin I (High Sensitivity): 1344 ng/L (ref <18)

## 2020-12-13 SURGERY — LEFT HEART CATH AND CORONARY ANGIOGRAPHY
Anesthesia: Moderate Sedation

## 2020-12-13 MED ORDER — SODIUM CHLORIDE 0.9 % IV SOLN: INTRAVENOUS | Status: AC

## 2020-12-13 MED ORDER — SODIUM CHLORIDE 0.9 % WEIGHT BASED INFUSION
3.0000 mL/kg/h | INTRAVENOUS | Status: DC
Start: 2020-12-14 — End: 2020-12-13
  Administered 2020-12-13: 3 mL/kg/h via INTRAVENOUS

## 2020-12-13 MED ORDER — SODIUM CHLORIDE 0.9% FLUSH
3.0000 mL | INTRAVENOUS | Status: DC | PRN
Start: 2020-12-13 — End: 2020-12-13

## 2020-12-13 MED ORDER — NITROGLYCERIN 1 MG/10 ML FOR IR/CATH LAB
INTRA_ARTERIAL | Status: DC | PRN
  Administered 2020-12-13: 200 ug via INTRACORONARY

## 2020-12-13 MED ORDER — MIDAZOLAM HCL 2 MG/2ML IJ SOLN
INTRAMUSCULAR | Status: DC | PRN
  Administered 2020-12-13: 1 mg via INTRAVENOUS

## 2020-12-13 MED ORDER — SODIUM CHLORIDE 0.9 % IV SOLN: 250.0000 mL | INTRAVENOUS | Status: DC | PRN

## 2020-12-13 MED ORDER — VERAPAMIL HCL 2.5 MG/ML IV SOLN
INTRAVENOUS | Status: DC | PRN
  Administered 2020-12-13: 2.5 mg via INTRA_ARTERIAL

## 2020-12-13 MED ORDER — HEPARIN (PORCINE) IN NACL 1000-0.9 UT/500ML-% IV SOLN
INTRAVENOUS | Status: AC
  Filled 2020-12-13: qty 1000

## 2020-12-13 MED ORDER — IOHEXOL 350 MG/ML SOLN
INTRAVENOUS | Status: DC | PRN
  Administered 2020-12-13: 50 mL

## 2020-12-13 MED ORDER — LABETALOL HCL 5 MG/ML IV SOLN: 10.0000 mg | INTRAVENOUS | Status: AC | PRN

## 2020-12-13 MED ORDER — LIDOCAINE HCL (PF) 1 % IJ SOLN
INTRAMUSCULAR | Status: DC | PRN
  Administered 2020-12-13: 2 mL

## 2020-12-13 MED ORDER — SODIUM CHLORIDE 0.9 % IV SOLN: INTRAVENOUS | Status: DC

## 2020-12-13 MED ORDER — HYDRALAZINE HCL 20 MG/ML IJ SOLN
INTRAMUSCULAR | Status: AC
  Filled 2020-12-13: qty 1

## 2020-12-13 MED ORDER — HEPARIN (PORCINE) IN NACL 1000-0.9 UT/500ML-% IV SOLN
INTRAVENOUS | Status: DC | PRN
  Administered 2020-12-13 (×2): 500 mL

## 2020-12-13 MED ORDER — LIDOCAINE HCL 1 % IJ SOLN
INTRAMUSCULAR | Status: AC
  Filled 2020-12-13: qty 20

## 2020-12-13 MED ORDER — SODIUM CHLORIDE 0.9% FLUSH: 3.0000 mL | INTRAVENOUS | Status: DC | PRN

## 2020-12-13 MED ORDER — VERAPAMIL HCL 2.5 MG/ML IV SOLN
INTRAVENOUS | Status: AC
  Filled 2020-12-13: qty 2

## 2020-12-13 MED ORDER — SODIUM CHLORIDE 0.9 % WEIGHT BASED INFUSION
1.0000 mL/kg/h | INTRAVENOUS | Status: DC
Start: 2020-12-14 — End: 2020-12-13
  Administered 2020-12-13: 1 mL/kg/h via INTRAVENOUS

## 2020-12-13 MED ORDER — MIDAZOLAM HCL 2 MG/2ML IJ SOLN
INTRAMUSCULAR | Status: AC
  Filled 2020-12-13: qty 2

## 2020-12-13 MED ORDER — HEPARIN (PORCINE) 25000 UT/250ML-% IV SOLN
900.0000 [IU]/h | INTRAVENOUS | Status: DC
  Administered 2020-12-14: 750 [IU]/h via INTRAVENOUS
  Filled 2020-12-13: qty 250

## 2020-12-13 MED ORDER — FENTANYL CITRATE (PF) 100 MCG/2ML IJ SOLN
INTRAMUSCULAR | Status: AC
  Filled 2020-12-13: qty 2

## 2020-12-13 MED ORDER — SODIUM CHLORIDE 0.9% FLUSH: 3.0000 mL | Freq: Two times a day (BID) | INTRAVENOUS | Status: DC

## 2020-12-13 MED ORDER — HEPARIN SODIUM (PORCINE) 1000 UNIT/ML IJ SOLN
INTRAMUSCULAR | Status: AC
  Filled 2020-12-13: qty 1

## 2020-12-13 MED ORDER — HEPARIN SODIUM (PORCINE) 1000 UNIT/ML IJ SOLN
INTRAMUSCULAR | Status: DC | PRN
  Administered 2020-12-13 (×2): 3000 [IU] via INTRAVENOUS

## 2020-12-13 MED ORDER — SODIUM CHLORIDE 0.9% FLUSH
3.0000 mL | Freq: Two times a day (BID) | INTRAVENOUS | Status: DC
  Administered 2020-12-13 – 2020-12-17 (×8): 3 mL via INTRAVENOUS

## 2020-12-13 SURGICAL SUPPLY — 15 items
CATH 5F 110X4 TIG (CATHETERS) ×1 IMPLANT
CATH LAUNCHER 5F JR4 (CATHETERS) ×1 IMPLANT
DEVICE RAD TR BAND REGULAR (VASCULAR PRODUCTS) ×1 IMPLANT
DRAPE BRACHIAL (DRAPES) ×1 IMPLANT
GLIDESHEATH SLEND A-KIT 6F 22G (SHEATH) ×1 IMPLANT
GUIDEWIRE INQWIRE 1.5J.035X260 (WIRE) IMPLANT
GUIDEWIRE PRESS OMNI 185 ST (WIRE) ×1 IMPLANT
INQWIRE 1.5J .035X260CM (WIRE) ×2
KIT ENCORE 26 ADVANTAGE (KITS) ×1 IMPLANT
PACK CARDIAC CATH (CUSTOM PROCEDURE TRAY) ×2 IMPLANT
PROTECTION STATION PRESSURIZED (MISCELLANEOUS) ×2
SET ATX SIMPLICITY (MISCELLANEOUS) ×1 IMPLANT
STATION PROTECTION PRESSURIZED (MISCELLANEOUS) IMPLANT
TUBING CIL FLEX 10 FLL-RA (TUBING) ×1 IMPLANT
WIRE HI TORQ VERSACORE J 260CM (WIRE) ×1 IMPLANT

## 2020-12-13 NOTE — Progress Notes (Signed)
Progress Note  Patient Name: Shannon Chen Date of Encounter: 12/13/2020  Primary Cardiologist: Julien Nordmann, MD   Subjective   Pending cath today.  She reports that she was not certain what procedure she was proceeding to with catheterization and alternatives again presented and risks/benefits of the procedure reviewed.  She is agreeable to proceed with LHC today.  No chest pain.   She reports her breathing is stable, though she does not use oxygen at home.  She is hungry.  N.p.o. status pending.  Echo performed, pending official read   Inpatient Medications    Scheduled Meds:  aspirin  324 mg Oral NOW   Or   aspirin  300 mg Rectal NOW   aspirin EC  81 mg Oral Daily   insulin aspart  0-9 Units Subcutaneous TID WC   metoprolol succinate  25 mg Oral Daily   mirtazapine  7.5 mg Oral QHS   rosuvastatin  40 mg Oral Daily   Vibegron  75 mg Oral Daily   Continuous Infusions:  sodium chloride 75 mL/hr at 12/13/20 1006   heparin 750 Units/hr (12/13/20 0122)   PRN Meds: acetaminophen, nitroGLYCERIN, ondansetron (ZOFRAN) IV, traMADol   Vital Signs    Vitals:   12/13/20 0400 12/13/20 0548 12/13/20 0727 12/13/20 0943  BP: (!) 154/69 105/69 (!) 130/56 128/74  Pulse: 73 68  71  Resp: 16 12 12 12   Temp:   97.8 F (36.6 C)   TempSrc:   Oral   SpO2: 99% 100% 99% 100%  Weight:      Height:       No intake or output data in the 24 hours ending 12/13/20 1051 Last 3 Weights 12/12/2020 10/30/2020 10/08/2020  Weight (lbs) 140 lb 143 lb 140 lb 8 oz  Weight (kg) 63.504 kg 64.864 kg 63.73 kg      Telemetry    NSR- Personally Reviewed  ECG    No new tracings- Personally Reviewed  Physical Exam   GEN: Female, wrapped in blankets, appears fatigued Neck: No JVD Vascular: No carotid bruits; radial pulses 2+ bilaterally Cardiac:  normal S1, S2; RRR; 3/6 holosystolic murmur appreciated throughout the cardiac exam  Respiratory: Clear to auscultation with expiratory wheeze  bilaterally GI: Soft, nontender, non-distended  MS: No edema; No deformity. Neuro:  Nonfocal  Psych: Normal affect   Labs    High Sensitivity Troponin:   Recent Labs  Lab 12/12/20 1206 12/12/20 1420 12/12/20 1743 12/12/20 2224 12/13/20 0536  TROPONINIHS 2,926* 4,062* 3,313* 2,419* 1,344*      Chemistry Recent Labs  Lab 12/12/20 0828 12/13/20 0536  NA 138 140  K 3.7 4.2  CL 108 111  CO2 24 26  GLUCOSE 136* 107*  BUN 26* 29*  CREATININE 1.41* 1.24*  CALCIUM 9.4 9.2  GFRNONAA 38* 45*  ANIONGAP 6 3*     Hematology Recent Labs  Lab 12/12/20 0828 12/13/20 0536  WBC 4.1 3.9*  RBC 4.73 4.56  HGB 13.1 12.1  HCT 40.1 38.2  MCV 84.8 83.8  MCH 27.7 26.5  MCHC 32.7 31.7  RDW 14.0 13.9  PLT 163 156    BNP Recent Labs  Lab 12/12/20 0828  BNP 12.3     DDimer No results for input(s): DDIMER in the last 168 hours.   Radiology    DG Chest 2 View  Result Date: 12/12/2020 CLINICAL DATA:  Near-syncope EXAM: CHEST - 2 VIEW COMPARISON:  Chest radiograph 05/18/2020 FINDINGS: The left chest wall cardiac device and  associated leads are stable. The cardiomediastinal silhouette is stable, with unchanged calcified atherosclerotic plaque of the aortic arch. There is no focal consolidation or pulmonary edema. There is no pleural effusion or pneumothorax. There is no acute osseous abnormality. IMPRESSION: Stable chest with no radiographic evidence of acute cardiopulmonary process. Electronically Signed   By: Lesia Hausen M.D.   On: 12/12/2020 13:05   CT HEAD WO CONTRAST ( )  Result Date: 12/12/2020 CLINICAL DATA:  Weakness, neuro deficit EXAM: CT HEAD WITHOUT CONTRAST TECHNIQUE: Contiguous axial images were obtained from the base of the skull through the vertex without intravenous contrast. COMPARISON:  CT head 06/09/2020 FINDINGS: Brain: There is no evidence of acute intracranial hemorrhage, extra-axial fluid collection, or acute infarct. There is unchanged global parenchymal  volume loss with enlargement of the ventricular system. The ventricles are stable in size. 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 Additional confluent hypodensity throughout the remainder of the subcortical and periventricular white matter is unchanged, again likely reflecting sequela of advanced chronic white matter microangiopathy. There is no mass lesion.  There is no midline shift. Vascular: There is calcification of the bilateral cavernous ICAs and vertebral arteries. Skull: Normal. Negative for fracture or focal lesion. Sinuses/Orbits: There is mild mucosal thickening in the paranasal sinuses. The globes and orbits are unremarkable. Other: There is a 1.6 cm by 1.0 cm hypodense lesion in the soft tissues of the right cheek, present since at least 2018 which may reflect a sebaceous cyst. IMPRESSION: 1. No acute intracranial pathology. 2. Unchanged global parenchymal volume loss, chronic white matter microangiopathy, and remote lacunar infarcts in the bilateral basal ganglia. 3. 1.6 cm hypodense lesion in the soft tissues of the right cheek has been present since at least 2018 and may reflect a sebaceous cyst. Correlate with exam. Electronically Signed   By: Lesia Hausen M.D.   On: 12/12/2020 13:10    Cardiac Studies   Echo 05/2020 1. Left ventricular ejection fraction, by estimation, is 60 to 65%. The  left ventricle has normal function. The left ventricle has no regional  wall motion abnormalities. There is severe concentric left ventricular  hypertrophy, notably around the mid to  distal LV walls and apical region. Left ventricular diastolic parameters  are consistent with Grade I diastolic dysfunction (impaired relaxation).   2. Right ventricular systolic function is normal. The right ventricular  size is normal. There is normal pulmonary artery systolic pressure. The  estimated right ventricular systolic pressure is 21.8 mmHg.   3. Left atrial size  was mildly dilated.   Patient Profile     75 y.o. female with history of hypertension, hypertrophic cardiomyopathy/severe LVH with normal EF, carotid stenosis s/p right CEA, syncope, labile hypertension, heart block s/p PPM 09/2016, former tobacco use, and who is being seen today for the evaluation of dizziness and with plan for cardiac catheterization.  Assessment & Plan    NSTEMI -- No current chest pain.  Reports stable breathing but does not use oxygen at home.  High-sensitivity troponin peaked at 4062, downtrending.  Previous echo as above with repeat echo performed and pending official read.  Continue ASA, IV heparin, Toprol, Crestor.  Scheduled for LHC today for further ischemic work-up given elevated at HS Tn.  Reviewed this procedure with the patient and answered all questions with patient willing to proceed with her scheduled LHC.  Continue n.p.o. in preparation for catheterization.   Shared Decision Making/Informed Consent The risks [stroke (1 in 1000), death (  1 in 1000), kidney failure [usually temporary] (1 in 500), bleeding (1 in 200), allergic reaction [possibly serious] (1 in 200)], benefits (diagnostic support and management of coronary artery disease) and alternatives of a cardiac catheterization were discussed in detail with Ms. Newhart and she is willing to proceed.   Dizziness/presyncope History of orthostatic hypotension -- Reports resolution of previous dizziness.  She does have a history of orthostatic hypotension.  Also considered is her history of carotid disease s/p right CEA with 1 to 39% bilateral ICA by ultrasound 02/2020.  S/p PPM as below.  History of HOCM/severe concentric LVH, which could certainly be contributing to her dizziness. Continue to monitor on telemetry.  Maintain electrolytes at goal.  Monitor H&H.  Perform orthostatics if recurrent dizziness.  Follow-up with vascular surgery for carotids /EP in OP setting. 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    HOCM/ severe concentric LVH -- Reports stable breathing, though currently on oxygen, and reports she does not use oxygen at home.  Most recent echo as above.  Repeat echo pending official results.  Consider HOCM is contributing to her dizziness.  Continue current medications.  Monitor I/os, daily weights.  Mobitz second-degree AV block s/p PPM -- Reports resolution of previous dizziness.  S/p PPM with recommendation for outpatient follow-up with EP as directed.  PAD, CAD s/p CEA --Continue ASA, Crestor  For questions or updates, please contact [facility] Please consult [email or web address] for contact info under        Signed, Lennon Alstrom, PA-C  12/13/2020, 10:51 AM

## 2020-12-13 NOTE — Consult Note (Signed)
ANTICOAGULATION CONSULT NOTE - Initial Consult  Pharmacy Consult for heparin infusion Indication: chest pain/ACS  No Known Allergies  Patient Measurements: Height: 5\' 5"  (165.1 cm) Weight: 63.5 kg (140 lb) IBW/kg (Calculated) : 57 Heparin Dosing Weight: 63.5 kg  Vital Signs: Temp: 97.8 F (36.6 C) (10/27 1512) Temp Source: Oral (10/27 1512) BP: 147/83 (10/27 1700) Pulse Rate: 69 (10/27 1700)  Labs: Recent Labs    12/12/20 0828 12/12/20 1206 12/12/20 1420 12/12/20 1743 12/12/20 2224 12/13/20 0536  HGB 13.1  --   --   --   --  12.1  HCT 40.1  --   --   --   --  38.2  PLT 163  --   --   --   --  156  APTT  --   --  31  --   --   --   LABPROT  --   --  13.7  --   --   --   INR  --   --  1.1  --   --   --   HEPARINUNFRC  --   --   --   --  0.37 0.33  CREATININE 1.41*  --   --   --   --  1.24*  TROPONINIHS  --    < > 4,062* 3,313* 2,419* 1,344*   < > = values in this interval not displayed.     Estimated Creatinine Clearance: 34.2 mL/min (A) (by C-G formula based on SCr of 1.24 mg/dL (H)).   Medical History: Past Medical History:  Diagnosis Date   Arthritis    AV block, Mobitz II    a. 09/2016 s/p MDT 10/2016 Azure XT DR MRI DC PPM   Carotid artery disease ([name])    a. 09/2016 Carotid U/S: RICA 70-99%, LICA 50-69%;  b. 09/2016 CTA Neck: RICA 60, RCCA 30, LICA 70%, L Vert 60, L Basilar 70; c. 10/2019 s/p R CEA.   Essential hypertension    History of stress test    a. 12/2016 MV: EF 51%, no ischemia/infarct; b. 10/2019 MV: EF 61%, no ischemia/infarct.   Hyperlipidemia    Hypertrophic cardiomyopathy ([name])    a. 09/2016 Echo: EF 60-65%, no rwma, Gr1 DD, Ca2+ MV annulus; b. 11/2017 Echo: EF 70-75%, no rwma, Gr1 DD, mild AS vs dynamic LVOT obs. Nl RV fxn. Hyperdynamic LVEF w/ sev LVH; c. 10/2019 Echo: EF 70-75%, no rwma, gr1 DD, sev LVH, Gr1 DD, nl PASP, triv MR, mild-mod Ao sclerosis; c. 05/2020 Echo: EF 60-65%, no rwma, sev conc LVH, gr1 DD, nl RV fxn. RVSP 21.44mmHg. Mildly dil  LA.   Presence of permanent cardiac pacemaker     Medications:  No prior AC noted   Assessment: 75 y.o. female with past medical history of carotid artery disease, hypertrophic cardiomyopathy with pacemaker, presents with an episode of lightheadedness dizziness and diaphoresis. Troponin 2900>4000. Pharmacy has been consulted for heparin infusion.  Goal of Therapy:  Heparin level 0.3-0.7 units/ml Monitor platelets by anticoagulation protocol: Yes   Plan:  Heparin therapeutic x 2 @ 750 units/hr Resume heparin at previous rate 8 hours following sheath removal per cardiology  Check HL 8 hours following heparin resuming    62, PharmD, BCPS Clinical Pharmacist   12/13/2020,5:34 PM

## 2020-12-13 NOTE — ED Notes (Signed)
Pt to cath lab accompanied by nurse

## 2020-12-13 NOTE — Interval H&P Note (Signed)
History and Physical Interval Note:  12/13/2020 3:23 PM  Shannon Chen  has presented today for surgery, with the diagnosis of cardiac catheterization with angiography and possible percutaneous coronary intervention.  The various methods of treatment have been discussed with the patient and family. After consideration of risks, benefits and other options for treatment, the patient has consented to  Procedure(s): LEFT HEART CATH AND CORONARY ANGIOGRAPHY (N/A)  PERCUTANEOUS CORONARY INTERVENTION  as a surgical intervention.  The patient's history has been reviewed, patient examined, no change in status, stable for surgery.  I have reviewed the patient's chart and labs.  Questions were answered to the patient's satisfaction.    Cath Lab Visit (complete for each Cath Lab visit)  Clinical Evaluation Leading to the Procedure:   ACS: Yes.    Non-ACS:    Anginal Classification: CCS IV  Anti-ischemic medical therapy: Maximal Therapy (2 or more classes of medications)  Non-Invasive Test Results: No non-invasive testing performed  Prior CABG: No previous CABG  Bryan Lemma, MD

## 2020-12-13 NOTE — Progress Notes (Signed)
[facility] Progress Note  Patient: Shannon Chen    GLO:[id number]  DOA: 12/12/2020     Date of Service: the patient was seen and examined on 12/13/2020  Chief Complaint  Patient presents with   Near Syncope   Brief hospital course: The patient is a 75 yr old woman who presented to [facility] ED with complaints of lightheadedness, dizziness, and diaphoresis. She denies chest pain or recent history of chest pain. She did not pass out nor did she feel that she was going to pass out. The duration of her symptoms were 10 minutes. No nausea or vomiting. It seems that she has had several of these episodes are in the last couple of months. She has sought help from her PCP for these episodes, but no cause was found.    She has a medical history significant for arthritis, carotid artery disease, remote CVA, HTN, HDL, hypertrophic cardiomyopathy with pacemaker.   EKG in the ED was unchanged from previous from 10/30/2020. CT head demonstrated no acute pathology to explain symptoms. CXR also demonstrated no acute pathology. Troponins were 2926 to 4062 to 3313.    [facility] have been consulted to admit the patient for further evaluation and care. Cardiology has been consulted.    The patient denies fevers, chills, cough, cough productive of sputum, nausea, vomiting, syncope, swelling, shortness of breath, sores, rashes, lesions, neurological changes other than dizziness, hematochezia, melena, or coffee ground emesis.   Assessment and Plan:  NSTEMI (non-ST elevated myocardial infarction) ([name]) With troponins of 2926, 4062, and 3313. Continue to monitor. Unable to evaluate ischemia by EKG due to presence of RBBB. This was present on previous EKG. Patient was started on heparin IV infusion Continue aspirin 80 mg daily and nitroglycerin as needed Cardiology consulted.  Keep n.p.o. for cardiac cath today  AKI on CKD stage III Baseline creatinine 0.9, 6 months ago, creatinine elevated on  admission Continue monitor renal functions and urine output Started gentle hydration NS 75 mill per hour   Hypertension The patient is normotensive on her home dosage of metoprolol. Monitor.   Hypertrophic cardiomyopathy  Noted. Echocardiogram is being repeated. Cardiology consulted.   LVH (left ventricular hypertrophy) due to hypertensive disease, without heart failure Noted. Continue antihypertensives. Echocardiogram has been ordered.     RBBB Noted. Present on previous EKG's. Unable to evaluate ischemia from EKG due to this finding.   Type 2 diabetes mellitus with vascular disease ([name]) Check HbA1c. It appears that the patient's glucoses are covered with farxiga alone at home. Will follow FSBS with SSI.    Body mass index is 23.3 kg/m.  Interventions:      Diet: Currently n.p.o. we will start diabetic diet once cleared by cardiology DVT Prophylaxis: Therapeutic Anticoagulation with heparin IV infusion    Advance goals of care discussion: Full code  Family Communication: family was NOT present at bedside, at the time of interview.  The pt provided permission to discuss medical plan with the family. Opportunity was given to ask question and all questions were answered satisfactorily.   Disposition:  Pt is from Home, admitted with nonspecific symptoms with NSTEMI, still has symptoms, seen by cardiology, cardiac cath pending, which precludes a safe discharge. Discharge to home, when can be resolved, needs cardiology clearance.  Subjective: No significant event overnight, patient was admitted due to feeling diaphoretic and clammy, no chest pain.  Currently patient is asymptomatic.  Denies any chest pain or palpitations, no any other active issues.  Patient was  wearing for cardiac cath.   Physical Exam: General:  alert oriented to time, place, and person.  Appear in mild distress, affect appropriate Eyes: PERRLA ENT: Oral Mucosa Clear, moist  Neck: no JVD,   Cardiovascular: S1 and S2 Present, no Murmur,  Respiratory: good respiratory effort, Bilateral Air entry equal and Decreased, no Crackles, no wheezes Abdomen: Bowel Sound present, Soft and no tenderness,  Skin: no rashes Extremities: no Pedal edema, no calf tenderness Neurologic: without any new focal findings Gait not checked due to patient safety concerns  Vitals:   12/13/20 0943 12/13/20 1244 12/13/20 1441 12/13/20 1512  BP: 128/74 129/61 (!) 143/59 (!) 149/70  Pulse: 71 71 70 68  Resp: 12 12 12 18   Temp:    97.8 F (36.6 C)  TempSrc:    Oral  SpO2: 100% 100% 99% 97%  Weight:      Height:       No intake or output data in the 24 hours ending 12/13/20 1532 Filed Weights   12/12/20 0821  Weight: 63.5 kg    Data Reviewed: I have personally reviewed and interpreted daily labs, tele strips, imagings as discussed above. I reviewed all nursing notes, pharmacy notes, vitals, pertinent old records I have discussed plan of care as described above with RN and patient/family.  CBC: Recent Labs  Lab 12/12/20 0828 12/13/20 0536  WBC 4.1 3.9*  HGB 13.1 12.1  HCT 40.1 38.2  MCV 84.8 83.8  PLT 163 156   Basic Metabolic Panel: Recent Labs  Lab 12/12/20 0828 12/12/20 1206 12/13/20 0536  NA 138  --  140  K 3.7  --  4.2  CL 108  --  111  CO2 24  --  26  GLUCOSE 136*  --  107*  BUN 26*  --  29*  CREATININE 1.41*  --  1.24*  CALCIUM 9.4  --  9.2  MG  --  2.2  --   PHOS  --   --  4.6    Studies: No results found.  Scheduled Meds:  [MAR Hold] aspirin EC  81 mg Oral Daily   [MAR Hold] insulin aspart  0-9 Units Subcutaneous TID WC   [MAR Hold] metoprolol succinate  25 mg Oral Daily   [MAR Hold] mirtazapine  7.5 mg Oral QHS   [MAR Hold] rosuvastatin  40 mg Oral Daily   sodium chloride flush  3 mL Intravenous Q12H   [MAR Hold] Vibegron  75 mg Oral Daily   Continuous Infusions:  sodium chloride     sodium chloride 75 mL/hr at 12/13/20 1006   [START ON 12/14/2020]  sodium chloride 3 mL/kg/hr (12/13/20 1505)   Followed by   12/15/20 ON 12/14/2020] sodium chloride 1 mL/kg/hr (12/13/20 1505)   heparin Stopped (12/13/20 1505)   PRN Meds: sodium chloride, [MAR Hold] acetaminophen, [MAR Hold] nitroGLYCERIN, [MAR Hold] ondansetron (ZOFRAN) IV, sodium chloride flush, [MAR Hold] traMADol  Time spent: 35 minutes  Author: 12/15/20. MD [facility] 12/13/2020 3:32 PM  To reach On-call, see care teams to locate the attending and reach out to them via [email or web address]. If 7PM-7AM, please contact night-coverage If you still have difficulty reaching the attending provider, please page the [facility] (Director on Call) for [facility] on amion for assistance.

## 2020-12-13 NOTE — Plan of Care (Signed)

## 2020-12-13 NOTE — Consult Note (Signed)
ANTICOAGULATION CONSULT NOTE - Initial Consult  Pharmacy Consult for heparin infusion Indication: chest pain/ACS  No Known Allergies  Patient Measurements: Height: 5\' 5"  (165.1 cm) Weight: 63.5 kg (140 lb) IBW/kg (Calculated) : 57 Heparin Dosing Weight: 63.5 kg  Vital Signs: Temp: 97.9 F (36.6 C) (10/26 2208) Temp Source: Oral (10/26 2208) BP: 105/69 (10/27 0548) Pulse Rate: 68 (10/27 0548)  Labs: Recent Labs    12/12/20 0828 12/12/20 1206 12/12/20 1420 12/12/20 1743 12/12/20 2224 12/13/20 0536  HGB 13.1  --   --   --   --  12.1  HCT 40.1  --   --   --   --  38.2  PLT 163  --   --   --   --  156  APTT  --   --  31  --   --   --   LABPROT  --   --  13.7  --   --   --   INR  --   --  1.1  --   --   --   HEPARINUNFRC  --   --   --   --  0.37 0.33  CREATININE 1.41*  --   --   --   --  1.24*  TROPONINIHS  --    < > 4,062* 3,313* 2,419*  --    < > = values in this interval not displayed.     Estimated Creatinine Clearance: 34.2 mL/min (A) (by C-G formula based on SCr of 1.24 mg/dL (H)).   Medical History: Past Medical History:  Diagnosis Date   Arthritis    AV block, Mobitz II    a. 09/2016 s/p MDT 10/2016 Azure XT DR MRI DC PPM   Carotid artery disease ([name])    a. 09/2016 Carotid U/S: RICA 70-99%, LICA 50-69%;  b. 09/2016 CTA Neck: RICA 60, RCCA 30, LICA 70%, L Vert 60, L Basilar 70; c. 10/2019 s/p R CEA.   Essential hypertension    History of stress test    a. 12/2016 MV: EF 51%, no ischemia/infarct; b. 10/2019 MV: EF 61%, no ischemia/infarct.   Hyperlipidemia    Hypertrophic cardiomyopathy ([name])    a. 09/2016 Echo: EF 60-65%, no rwma, Gr1 DD, Ca2+ MV annulus; b. 11/2017 Echo: EF 70-75%, no rwma, Gr1 DD, mild AS vs dynamic LVOT obs. Nl RV fxn. Hyperdynamic LVEF w/ sev LVH; c. 10/2019 Echo: EF 70-75%, no rwma, gr1 DD, sev LVH, Gr1 DD, nl PASP, triv MR, mild-mod Ao sclerosis; c. 05/2020 Echo: EF 60-65%, no rwma, sev conc LVH, gr1 DD, nl RV fxn. RVSP 21.13mmHg. Mildly dil  LA.   Presence of permanent cardiac pacemaker     Medications:  No prior AC noted   Assessment: 75 y.o. female with past medical history of carotid artery disease, hypertrophic cardiomyopathy with pacemaker, presents with an episode of lightheadedness dizziness and diaphoresis. Troponin 2900>4000. Pharmacy has been consulted for heparin infusion.  Goal of Therapy:  Heparin level 0.3-0.7 units/ml Monitor platelets by anticoagulation protocol: Yes   Plan:  10/27:  HL @ 0536 = 0.33 , therapeutic X 2 Will continue pt on current rate and recheck HL on 10/28 with AM labs.   Atif Chapple D Clinical Pharmacist   12/13/2020,6:20 AM

## 2020-12-13 NOTE — ED Notes (Signed)
Report provided to special procedures for cath.

## 2020-12-14 ENCOUNTER — Other Ambulatory Visit: Payer: Self-pay | Admitting: Internal Medicine

## 2020-12-14 ENCOUNTER — Encounter: Payer: Self-pay | Admitting: Cardiology

## 2020-12-14 DIAGNOSIS — I422 Other hypertrophic cardiomyopathy: Secondary | ICD-10-CM | POA: Diagnosis not present

## 2020-12-14 DIAGNOSIS — I214 Non-ST elevation (NSTEMI) myocardial infarction: Secondary | ICD-10-CM | POA: Diagnosis not present

## 2020-12-14 LAB — BASIC METABOLIC PANEL
Anion gap: 9 (ref 5–15)
BUN: 28 mg/dL — ABNORMAL HIGH (ref 8–23)
CO2: 23 mmol/L (ref 22–32)
Calcium: 8.7 mg/dL — ABNORMAL LOW (ref 8.9–10.3)
Chloride: 108 mmol/L (ref 98–111)
Creatinine, Ser: 1.12 mg/dL — ABNORMAL HIGH (ref 0.44–1.00)
GFR, Estimated: 51 mL/min — ABNORMAL LOW (ref >60)
Glucose, Bld: 96 mg/dL (ref 70–99)
Potassium: 3.8 mmol/L (ref 3.5–5.1)
Sodium: 140 mmol/L (ref 135–145)

## 2020-12-14 LAB — MAGNESIUM: Magnesium: 1.9 mg/dL (ref 1.7–2.4)

## 2020-12-14 LAB — CBC
HCT: 35.7 % — ABNORMAL LOW (ref 36.0–46.0)
Hemoglobin: 11.7 g/dL — ABNORMAL LOW (ref 12.0–15.0)
MCH: 27.8 pg (ref 26.0–34.0)
MCHC: 32.8 g/dL (ref 30.0–36.0)
MCV: 84.8 fL (ref 80.0–100.0)
Platelets: 144 10*3/uL — ABNORMAL LOW (ref 150–400)
RBC: 4.21 MIL/uL (ref 3.87–5.11)
RDW: 13.9 % (ref 11.5–15.5)
WBC: 3.7 10*3/uL — ABNORMAL LOW (ref 4.0–10.5)
nRBC: 0 % (ref 0.0–0.2)

## 2020-12-14 LAB — GLUCOSE, CAPILLARY
Glucose-Capillary: 102 mg/dL — ABNORMAL HIGH (ref 70–99)
Glucose-Capillary: 117 mg/dL — ABNORMAL HIGH (ref 70–99)
Glucose-Capillary: 149 mg/dL — ABNORMAL HIGH (ref 70–99)
Glucose-Capillary: 113 mg/dL — ABNORMAL HIGH (ref 70–99)

## 2020-12-14 LAB — HEPARIN LEVEL (UNFRACTIONATED): Heparin Unfractionated: 0.24 IU/mL — ABNORMAL LOW (ref 0.30–0.70)

## 2020-12-14 LAB — PHOSPHORUS: Phosphorus: 4.2 mg/dL (ref 2.5–4.6)

## 2020-12-14 MED ORDER — CLOPIDOGREL BISULFATE 75 MG PO TABS
75.0000 mg | ORAL_TABLET | Freq: Every day | ORAL | Status: DC
  Administered 2020-12-14 – 2020-12-17 (×4): 75 mg via ORAL
  Filled 2020-12-14 (×4): qty 1

## 2020-12-14 MED ORDER — HEPARIN BOLUS VIA INFUSION
1000.0000 [IU] | Freq: Once | INTRAVENOUS | Status: AC
  Administered 2020-12-14: 1000 [IU] via INTRAVENOUS
  Filled 2020-12-14: qty 1000

## 2020-12-14 MED ORDER — METOPROLOL SUCCINATE ER 25 MG PO TB24
25.0000 mg | ORAL_TABLET | Freq: Two times a day (BID) | ORAL | Status: AC
  Administered 2020-12-14 – 2020-12-15 (×3): 25 mg via ORAL
  Filled 2020-12-14 (×3): qty 1

## 2020-12-14 MED ORDER — SODIUM CHLORIDE 0.9 % IV SOLN: INTRAVENOUS | Status: AC

## 2020-12-14 NOTE — Consult Note (Signed)
ANTICOAGULATION CONSULT NOTE - Initial Consult  Pharmacy Consult for heparin infusion Indication: chest pain/ACS  No Known Allergies  Patient Measurements: Height: 5\' 5"  (165.1 cm) Weight: 63.5 kg (140 lb) IBW/kg (Calculated) : 57 Heparin Dosing Weight: 63.5 kg  Vital Signs: Temp: 98.1 F (36.7 C) (10/28 0737) Temp Source: Oral (10/28 0737) BP: 198/94 (10/28 0737) Pulse Rate: 92 (10/28 0737)  Labs: Recent Labs    12/12/20 0828 12/12/20 1206 12/12/20 1420 12/12/20 1743 12/12/20 2224 12/13/20 0536 12/14/20 0521  HGB 13.1  --   --   --   --  12.1 11.7*  HCT 40.1  --   --   --   --  38.2 35.7*  PLT 163  --   --   --   --  156 144*  APTT  --   --  31  --   --   --   --   LABPROT  --   --  13.7  --   --   --   --   INR  --   --  1.1  --   --   --   --   HEPARINUNFRC  --   --   --   --  0.37 0.33  --   CREATININE 1.41*  --   --   --   --  1.24* 1.12*  TROPONINIHS  --    < > 4,062* 3,313* 2,419* 1,344*  --    < > = values in this interval not displayed.     Estimated Creatinine Clearance: 37.9 mL/min (A) (by C-G formula based on SCr of 1.12 mg/dL (H)).   Medical History: Past Medical History:  Diagnosis Date   Arthritis    AV block, Mobitz II    a. 09/2016 s/p MDT 10/2016 Azure XT DR MRI DC PPM   Carotid artery disease ([name])    a. 09/2016 Carotid U/S: RICA 70-99%, LICA 50-69%;  b. 09/2016 CTA Neck: RICA 60, RCCA 30, LICA 70%, L Vert 60, L Basilar 70; c. 10/2019 s/p R CEA.   Essential hypertension    History of stress test    a. 12/2016 MV: EF 51%, no ischemia/infarct; b. 10/2019 MV: EF 61%, no ischemia/infarct.   Hyperlipidemia    Hypertrophic cardiomyopathy ([name])    a. 09/2016 Echo: EF 60-65%, no rwma, Gr1 DD, Ca2+ MV annulus; b. 11/2017 Echo: EF 70-75%, no rwma, Gr1 DD, mild AS vs dynamic LVOT obs. Nl RV fxn. Hyperdynamic LVEF w/ sev LVH; c. 10/2019 Echo: EF 70-75%, no rwma, gr1 DD, sev LVH, Gr1 DD, nl PASP, triv MR, mild-mod Ao sclerosis; c. 05/2020 Echo: EF 60-65%, no  rwma, sev conc LVH, gr1 DD, nl RV fxn. RVSP 21.56mmHg. Mildly dil LA.   Presence of permanent cardiac pacemaker     Medications:  No prior AC/APT noted; NKDA Heparin Dosing Weight: 63.5 kg  Assessment: 75 y.o. female with past medical history of carotid artery disease, hypertrophic cardiomyopathy with pacemaker, presents with an episode of lightheadedness dizziness and diaphoresis. Troponin 2900>4000. Pharmacy has been consulted for heparin infusion.  Date Time HL Rate/Comment 10/26 2224 0.37 Therapeutic; 750 un/hr   10/27 0536 0.33 Therapeutic; 750 un/hr 10/27 --- --- Held for procedure; resumed 10/28 ~0100 10/28 0857 0.24 Subtherapeutic; 750 > 900 un/hr   Baseline Labs: aPTT - 31s INR - 1.1 Hgb - 13.1>12.1>11.7 Plts - 11/28  Goal of Therapy:  Heparin level 0.3-0.7 units/ml Monitor platelets by anticoagulation protocol: Yes   Plan:  HL subtherapeutic @ 0.24 (goal 0.3-0.7). Give 1000 units bolus x1; then increase heparin infusion to 900 units/hr. Check anti-Xa level in 8 hours and daily while on heparin Continue to monitor H&H and platelets  Martyn Malay, PharmD, [facility] Clinical Pharmacist   12/14/2020,7:41 AM

## 2020-12-14 NOTE — Progress Notes (Signed)
Progress Note  Patient Name: Shannon Chen Date of Encounter: 12/14/2020  Primary Cardiologist: [name]  Subjective   No chest pain or dyspnea. Clamminess has resolved. No right radial artery arteriotomy site complications. Post cath labs stable. She will complete 48 hours of heparin gtt at 14:52 today.   Inpatient Medications    Scheduled Meds:  aspirin EC  81 mg Oral Daily   insulin aspart  0-9 Units Subcutaneous TID WC   metoprolol succinate  25 mg Oral Daily   mirtazapine  7.5 mg Oral QHS   rosuvastatin  40 mg Oral Daily   sodium chloride flush  3 mL Intravenous Q12H   Vibegron  75 mg Oral Daily   Continuous Infusions:  sodium chloride     sodium chloride 75 mL/hr at 12/14/20 1042   heparin 900 Units/hr (12/14/20 1047)   PRN Meds: sodium chloride, acetaminophen, nitroGLYCERIN, ondansetron (ZOFRAN) IV, sodium chloride flush, traMADol   Vital Signs    Vitals:   12/13/20 1937 12/14/20 0250 12/14/20 0737 12/14/20 1110  BP: (!) 157/74 134/77 (!) 198/94 (!) 150/98  Pulse: 87 72 92 91  Resp: 18 16 17 18   Temp: 98 F (36.7 C) 98.3 F (36.8 C) 98.1 F (36.7 C) (!) 97.5 F (36.4 C)  TempSrc: Oral Oral Oral   SpO2: 96% 99% 100% 100%  Weight:      Height:        Intake/Output Summary (Last 24 hours) at 12/14/2020 1325 Last data filed at 12/14/2020 1000 Gross per 24 hour  Intake 840 ml  Output 500 ml  Net 340 ml   Filed Weights   12/12/20 0821  Weight: 63.5 kg    Telemetry    SR with artifact - Personally Reviewed  ECG    No new tracings - Personally Reviewed  Physical Exam   GEN: No acute distress.   Neck: No JVD. Cardiac: RRR, no murmurs, rubs, or gallops. Right radial artery arteriotomy site without active bleeding, swelling, warmth, bruising, or TTP. Radial pulse 2+ proximal and distal to the arteriotomy site.  Respiratory: Clear to auscultation bilaterally.  GI: Soft, nontender, non-distended.   MS: No edema; No deformity. Neuro:  Alert  and oriented x 3; Nonfocal.  Psych: Normal affect.  Labs    Chemistry Recent Labs  Lab 12/12/20 0828 12/13/20 0536 12/14/20 0521  NA 138 140 140  K 3.7 4.2 3.8  CL 108 111 108  CO2 24 26 23   GLUCOSE 136* 107* 96  BUN 26* 29* 28*  CREATININE 1.41* 1.24* 1.12*  CALCIUM 9.4 9.2 8.7*  GFRNONAA 38* 45* 51*  ANIONGAP 6 3* 9     Hematology Recent Labs  Lab 12/12/20 0828 12/13/20 0536 12/14/20 0521  WBC 4.1 3.9* 3.7*  RBC 4.73 4.56 4.21  HGB 13.1 12.1 11.7*  HCT 40.1 38.2 35.7*  MCV 84.8 83.8 84.8  MCH 27.7 26.5 27.8  MCHC 32.7 31.7 32.8  RDW 14.0 13.9 13.9  PLT 163 156 144*    Cardiac EnzymesNo results for input(s): TROPONINI in the last 168 hours. No results for input(s): TROPIPOC in the last 168 hours.   BNP Recent Labs  Lab 12/12/20 0828  BNP 12.3     DDimer No results for input(s): DDIMER in the last 168 hours.   Radiology      Cardiac Studies   LHC 12/13/2020:   Moderate to Severe calcification of all epicardial coronary arteries; tortuous arteries.   Mid RCA lesion is 70% stenosed.  (  Very discrete lesion, questionable significance.  Range from 30 to 90% depending on image)   Dist Cx lesion is 30% stenosed.   LV end diastolic pressure is normal.   There is no aortic valve stenosis.   Post-Operative Diagnoses: Heavily calcified coronary arteries with diffuse mild to moderate calcific stenosis throughout.  Potential culprit lesion is a possible focal lesion in the mid RCA questionable significance.   In some images, there appears to be minimal stenosis whereas another images it appears to be severe.  Likely heavily calcified vessel with hazy appearance because of calcification. =   This lesion is in a very tortuous heavily calcified vessel, not able to advance FFR wired halfway to the lesion.  Likely not very amenable to PCI. Despite having Hypertrophic Cardiomyopathy, the LVEDP was normal at 14 mmHg. ->  LV gram not performed in order to conserve  contrast.     Recommendations: Aggressive risk factor modification for extensive calcified CAD. If the patient does have active anginal symptoms, could consider noninvasive evaluation of the RCA to determine if this is significant.  Would need to review with interventional colleagues to determine if there is any potential option as the entire vessel is calcified, very tortuous making it highly unlikely to be actually successfully treated.  Would definitely need to be done by the femoral access due to the 270  turn in the innominate artery __________  2D echo 12/12/2020: 1. Left ventricular ejection fraction, by estimation, is 60 to 65%. The  left ventricle has normal function. The left ventricle has no regional  wall motion abnormalities. There is severe left ventricular hypertrophy.  Left ventricular diastolic parameters   are indeterminate.   2. Right ventricular systolic function is normal. The right ventricular  size is normal. Tricuspid regurgitation signal is inadequate for assessing  PA pressure.   3. The mitral valve is normal in structure. Mild mitral valve  regurgitation. No evidence of mitral stenosis.   4. The aortic valve is normal in structure. Aortic valve regurgitation is  not visualized. Mild to moderate aortic valve sclerosis/calcification is  present, without any evidence of aortic stenosis.   5. The inferior vena cava is normal in size with greater than 50%  respiratory variability, suggesting right atrial pressure of 3 mmHg.  Patient Profile     75 y.o. female with history of HCM/severe LVH, carotid artery stenosis s/p right-sided CEA, syncope, labile hypertension, heart block s/p PP< in 09/2016, and prior tobacco use who we are seeing for NSTEMI.  Assessment & Plan    1. 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 HCM/severe LVH: -Echo as above -No syncope -Toprol XL  3. 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 Carotid artery disease: -Status post right-sided CEA -ASA -Crestor   5. HLD: -LDL 61 this admission -Crestor   6. AKI: -Improving    For questions or updates, please contact [facility] Please consult [email or web address] for contact info under Cardiology/STEMI.    Signed, Eula Listen, PA-C [facility] Pager: [phone] 12/14/2020, 1:25 PM

## 2020-12-14 NOTE — Progress Notes (Signed)
[facility] Progress Note  Patient: Shannon Chen    OZH:[id number]  DOA: 12/12/2020     Date of Service: the patient was seen and examined on 12/14/2020  Chief Complaint  Patient presents with   Near Syncope   Brief hospital course: The patient is a 75 yr old woman who presented to [facility] ED with complaints of lightheadedness, dizziness, and diaphoresis. She denies chest pain or recent history of chest pain. She did not pass out nor did she feel that she was going to pass out. The duration of her symptoms were 10 minutes. No nausea or vomiting. It seems that she has had several of these episodes are in the last couple of months. She has sought help from her PCP for these episodes, but no cause was found.    She has a medical history significant for arthritis, carotid artery disease, remote CVA, HTN, HDL, hypertrophic cardiomyopathy with pacemaker.   EKG in the ED was unchanged from previous from 10/30/2020. CT head demonstrated no acute pathology to explain symptoms. CXR also demonstrated no acute pathology. Troponins were 2926 to 4062 to 3313.    [facility] have been consulted to admit the patient for further evaluation and care. Cardiology has been consulted.    The patient denies fevers, chills, cough, cough productive of sputum, nausea, vomiting, syncope, swelling, shortness of breath, sores, rashes, lesions, neurological changes other than dizziness, hematochezia, melena, or coffee ground emesis.   Assessment and Plan:  NSTEMI (non-ST elevated myocardial infarction) ([name]) With troponins of 2926, 4062, and 3313. Continue to monitor. Unable to evaluate ischemia by EKG due to presence of RBBB. This was present on previous EKG. Patient was started on heparin IV infusion for 48 hours till 14:52 on 10/28 Continue aspirin 80 mg daily and nitroglycerin as needed Cardiology consulted. S/p cardiac cath, moderate to severe calcification of all epicardial coronary arteries,  tortuous arteries, mid RCA lesion 70% stenosed. Heavily calcified coronary arteries.  Cardiology recommended aggressive risk factor modification for extensive calcified CAD. Continue heparin IV infusion for 48 hours, and continue aspirin Toprol and Crestor. -If refractory symptoms despite optimization of medications, consider functional study to assess RCA stenosis -Post cath instructions -Ambulate     AKI on CKD stage III Baseline creatinine 0.9, 6 months ago, creatinine elevated on admission Continue monitor renal functions and urine output Started gentle hydration NS 75 mill per hour Cr 1.24--1.12   Hypertension The patient is normotensive on her home dosage of metoprolol. Monitor.   Hypertrophic cardiomyopathy  Noted. Echocardiogram is being repeated. Cardiology consulted.   LVH (left ventricular hypertrophy) due to hypertensive disease, without heart failure Noted. Continue antihypertensives. Echocardiogram has been ordered.     RBBB Noted. Present on previous EKG's. Unable to evaluate ischemia from EKG due to this finding.   Type 2 diabetes mellitus with vascular disease ([name]) Check HbA1c. It appears that the patient's glucoses are covered with farxiga alone at home. Will follow FSBS with SSI.    Body mass index is 23.3 kg/m.  Interventions:      Diet: Currently n.p.o. we will start diabetic diet once cleared by cardiology DVT Prophylaxis: Therapeutic Anticoagulation with heparin IV infusion    Advance goals of care discussion: Full code  Family Communication: family was NOT present at bedside, at the time of interview.  The pt provided permission to discuss medical plan with the family. Opportunity was given to ask question and all questions were answered satisfactorily.   Disposition:  Pt is  from Home, admitted with nonspecific symptoms with NSTEMI, still has symptoms, seen by cardiology, cardiac cath pending, which precludes a safe discharge. Discharge to  home, when can be resolved, needs cardiology clearance.  Subjective: No significant event overnight, patient denies any chest palpitation, no abdominal pain, no nausea vomiting or diarrhea.    Physical Exam: General:  alert oriented to time, place, and person.  Appear in mild distress, affect appropriate Eyes: PERRLA ENT: Oral Mucosa Clear, moist  Neck: no JVD,  Cardiovascular: S1 and S2 Present, no Murmur,  Respiratory: good respiratory effort, Bilateral Air entry equal and Decreased, no Crackles, no wheezes Abdomen: Bowel Sound present, Soft and no tenderness,  Skin: no rashes Extremities: no Pedal edema, no calf tenderness Neurologic: without any new focal findings Gait not checked due to patient safety concerns  Vitals:   12/14/20 0250 12/14/20 0737 12/14/20 1110 12/14/20 1513  BP: 134/77 (!) 198/94 (!) 150/98 124/68  Pulse: 72 92 91 75  Resp: 16 17 18 17   Temp: 98.3 F (36.8 C) 98.1 F (36.7 C) (!) 97.5 F (36.4 C) 98.8 F (37.1 C)  TempSrc: Oral Oral    SpO2: 99% 100% 100% 98%  Weight:      Height:        Intake/Output Summary (Last 24 hours) at 12/14/2020 1519 Last data filed at 12/14/2020 1345 Gross per 24 hour  Intake 1524.08 ml  Output 1100 ml  Net 424.08 ml   Filed Weights   12/12/20 0821  Weight: 63.5 kg    Data Reviewed: I have personally reviewed and interpreted daily labs, tele strips, imagings as discussed above. I reviewed all nursing notes, pharmacy notes, vitals, pertinent old records I have discussed plan of care as described above with RN and patient/family.  CBC: Recent Labs  Lab 12/12/20 0828 12/13/20 0536 12/14/20 0521  WBC 4.1 3.9* 3.7*  HGB 13.1 12.1 11.7*  HCT 40.1 38.2 35.7*  MCV 84.8 83.8 84.8  PLT 163 156 144*   Basic Metabolic Panel: Recent Labs  Lab 12/12/20 0828 12/12/20 1206 12/13/20 0536 12/14/20 0521  NA 138  --  140 140  K 3.7  --  4.2 3.8  CL 108  --  111 108  CO2 24  --  26 23  GLUCOSE 136*  --  107* 96   BUN 26*  --  29* 28*  CREATININE 1.41*  --  1.24* 1.12*  CALCIUM 9.4  --  9.2 8.7*  MG  --  2.2  --  1.9  PHOS  --   --  4.6 4.2    Studies: CARDIAC CATHETERIZATION  Result Date: 12/13/2020   Moderate to Severe calcification of all epicardial coronary arteries; tortuous arteries.   Mid RCA lesion is 70% stenosed.  (Very discrete lesion, questionable significance.  Range from 30 to 90% depending on image)   Dist Cx lesion is 30% stenosed.   LV end diastolic pressure is normal.   There is no aortic valve stenosis. Post-Operative Diagnoses: Heavily calcified coronary arteries with diffuse mild to moderate calcific stenosis throughout. Potential culprit lesion is a possible focal lesion in the mid RCA questionable significance.   In some images, there appears to be minimal stenosis whereas another images it appears to be severe.  Likely heavily calcified vessel with hazy appearance because of calcification. =  This lesion is in a very tortuous heavily calcified vessel, not able to advance FFR wired halfway to the lesion.  Likely not very amenable to PCI. Despite  having Hypertrophic Cardiomyopathy, the LVEDP was normal at 14 mmHg. ->  LV gram not performed in order to conserve contrast. Recommendations: Aggressive risk factor modification for extensive calcified CAD. If the patient does have active anginal symptoms, could consider noninvasive evaluation of the RCA to determine if this is significant.  Would need to review with interventional colleagues to determine if there is any potential option as the entire vessel is calcified, very tortuous making it highly unlikely to be actually successfully treated.  Would definitely need to be done by the femoral access due to the 270  turn in the innominate artery Bryan Lemma, MD   Scheduled Meds:  aspirin EC  81 mg Oral Daily   insulin aspart  0-9 Units Subcutaneous TID WC   metoprolol succinate  25 mg Oral Daily   mirtazapine  7.5 mg Oral QHS    rosuvastatin  40 mg Oral Daily   sodium chloride flush  3 mL Intravenous Q12H   Vibegron  75 mg Oral Daily   Continuous Infusions:  sodium chloride     sodium chloride 75 mL/hr at 12/14/20 1300   heparin 900 Units/hr (12/14/20 1300)   PRN Meds: sodium chloride, acetaminophen, nitroGLYCERIN, ondansetron (ZOFRAN) IV, sodium chloride flush, traMADol  Time spent: 35 minutes  Author: Gillis Santa. MD [facility] 12/14/2020 3:19 PM  To reach On-call, see care teams to locate the attending and reach out to them via [email or web address]. If 7PM-7AM, please contact night-coverage If you still have difficulty reaching the attending provider, please page the [facility] (Director on Call) for [facility] on amion for assistance.

## 2020-12-15 DIAGNOSIS — I214 Non-ST elevation (NSTEMI) myocardial infarction: Secondary | ICD-10-CM

## 2020-12-15 DIAGNOSIS — I10 Essential (primary) hypertension: Secondary | ICD-10-CM | POA: Diagnosis not present

## 2020-12-15 DIAGNOSIS — I422 Other hypertrophic cardiomyopathy: Secondary | ICD-10-CM | POA: Diagnosis not present

## 2020-12-15 LAB — CBC
HCT: 37.2 % (ref 36.0–46.0)
Hemoglobin: 12.2 g/dL (ref 12.0–15.0)
MCH: 27.4 pg (ref 26.0–34.0)
MCHC: 32.8 g/dL (ref 30.0–36.0)
MCV: 83.6 fL (ref 80.0–100.0)
Platelets: 148 10*3/uL — ABNORMAL LOW (ref 150–400)
RBC: 4.45 MIL/uL (ref 3.87–5.11)
RDW: 13.8 % (ref 11.5–15.5)
WBC: 3.9 10*3/uL — ABNORMAL LOW (ref 4.0–10.5)
nRBC: 0 % (ref 0.0–0.2)

## 2020-12-15 LAB — BASIC METABOLIC PANEL
Anion gap: 3 — ABNORMAL LOW (ref 5–15)
BUN: 25 mg/dL — ABNORMAL HIGH (ref 8–23)
CO2: 24 mmol/L (ref 22–32)
Calcium: 9.3 mg/dL (ref 8.9–10.3)
Chloride: 112 mmol/L — ABNORMAL HIGH (ref 98–111)
Creatinine, Ser: 0.99 mg/dL (ref 0.44–1.00)
GFR, Estimated: 59 mL/min — ABNORMAL LOW (ref >60)
Glucose, Bld: 105 mg/dL — ABNORMAL HIGH (ref 70–99)
Potassium: 3.8 mmol/L (ref 3.5–5.1)
Sodium: 139 mmol/L (ref 135–145)

## 2020-12-15 LAB — GLUCOSE, CAPILLARY
Glucose-Capillary: 107 mg/dL — ABNORMAL HIGH (ref 70–99)
Glucose-Capillary: 161 mg/dL — ABNORMAL HIGH (ref 70–99)
Glucose-Capillary: 100 mg/dL — ABNORMAL HIGH (ref 70–99)
Glucose-Capillary: 164 mg/dL — ABNORMAL HIGH (ref 70–99)

## 2020-12-15 LAB — PHOSPHORUS: Phosphorus: 3.5 mg/dL (ref 2.5–4.6)

## 2020-12-15 LAB — MAGNESIUM: Magnesium: 2 mg/dL (ref 1.7–2.4)

## 2020-12-15 MED ORDER — METOPROLOL SUCCINATE ER 50 MG PO TB24
50.0000 mg | ORAL_TABLET | Freq: Every day | ORAL | Status: DC
  Administered 2020-12-16 – 2020-12-17 (×2): 50 mg via ORAL
  Filled 2020-12-15 (×2): qty 1

## 2020-12-15 MED ORDER — HYDRALAZINE HCL 20 MG/ML IJ SOLN
10.0000 mg | Freq: Four times a day (QID) | INTRAMUSCULAR | Status: DC | PRN
  Administered 2020-12-15 – 2020-12-17 (×2): 10 mg via INTRAVENOUS
  Filled 2020-12-15 (×2): qty 1

## 2020-12-15 NOTE — Evaluation (Signed)
Physical Therapy Evaluation Patient Details Name: Shannon Chen MRN: [id number] DOB: 1945-10-28 Today's Date: 12/15/2020  History of Present Illness  Shannon Chen is a 75 y.o. female with history of HCM/severe LVH, carotid artery stenosis s/p right-sided CEA, syncope, labile hypertension, heart block s/p PP< in 09/2016, and prior tobacco use who is admitted for NSTEMI 1. Heavily calcified coronary arteries with diffuse mild to moderate calcific stenosis throughout.  Cath revealed Potential culprit lesion is a possible focal lesion in the mid RCA questionable significance.   In some images, there appears to be minimal stenosis whereas another images it appears to be severe.  Likely heavily calcified vessel with hazy appearance because of calcification.  2.This lesion is in a very tortuous heavily calcified vessel, not able to advance FFR wired halfway to the lesion.  Likely not very amenable to PCI.   Clinical Impression  Patient received reclining in bed. Reports headache of 5/10 at rest and declines requesting pain meds from RN. Patient has history of cognitive impairment in chart but is A&Ox4 and provides detailed history. Patient lives in a group home where she has assistance with Bathing, Dressing, IADLs, Grooming but she was modified independent with mobility using her SPC or RW. Upon PT evaluation, patient needed mod I for bed mobility, supervision for sit <> stand transfers with RW, supervision for ambulation with cuing for safe AD and body positioning, and CGA to ascend/descend 3 steps with right handrail. Patient complained of "feeling odd" and "disoriented" throughout session and reported "feeling warm" immediately after ambulation. Vitals remained WFL. Deficits seem mostly due to not feeling steady or well which appears to be part of her primary complaint for hospitalization. Expect her independence with mobility to improve significantly if these symptoms clear. Patient appears to have  experienced a decline in function and would benefit from home health PT upon discharge from acute care setting. Patient would benefit from skilled physical therapy to address impairments and functional limitations (see PT Problem List below) to work towards stated goals and return to PLOF or maximal functional independence.       Recommendations for follow up therapy are one component of a multi-disciplinary discharge planning process, led by the attending physician.  Recommendations may be updated based on patient status, additional functional criteria and insurance authorization.  Follow Up Recommendations Home health PT    Assistance Recommended at Discharge Intermittent Supervision/Assistance  Functional Status Assessment Patient has had a recent decline in their functional status and demonstrates the ability to make significant improvements in function in a reasonable and predictable amount of time.  Equipment Recommendations  None recommended by PT    Recommendations for Other Services OT consult     Precautions / Restrictions Precautions Precautions: Fall      Mobility  Bed Mobility Overal bed mobility: Modified Independent             General bed mobility comments: supine to sit from flat bed with increased time.    Transfers Overall transfer level: Needs assistance Equipment used: Rolling walker (2 wheels) Transfers: Sit to/from Stand Sit to Stand: Min guard;Supervision           General transfer comment: cuing for hand placement and to keep RW close to body.    Ambulation/Gait Ambulation/Gait assistance: Supervision;Min guard Gait Distance (Feet): 120 Feet Assistive device: Rolling walker (2 wheels) Gait Pattern/deviations: Trunk flexed;Decreased stride length Gait velocity: decreased   General Gait Details: Patient ambulates at slow pace and fails to keep RW  close to body despite cuing.  Stairs Stairs: Yes Stairs assistance: Min guard Stair  Management: One rail Right;Backwards;Forwards Number of Stairs: 3 General stair comments: step to gait pattern with BUE support on R handrail. ascended forwards, descended backwards.  Wheelchair Mobility    Modified Rankin (Stroke Patients Only)       Balance Overall balance assessment: Needs assistance Sitting-balance support: Bilateral upper extremity supported Sitting balance-Leahy Scale: Good       Standing balance-Leahy Scale: Fair Standing balance comment: requires use of RW for balance.                             Pertinent Vitals/Pain Pain Assessment: 0-10 Pain Score: 5  Pain Location: headache Pain Descriptors / Indicators: Aching Pain Intervention(s): Limited activity within patient's tolerance;Monitored during session (pateint declined offer to ask RN for pain meds)    Home Living Family/patient expects to be discharged to:: Group home (lives in group home with 5 other residents)   Available Help at Discharge: Available 24 hours/day (group home staff) Type of Home: Group Home Home Access: Stairs to enter Entrance Stairs-Rails: Right Entrance Stairs-Number of Steps: 2-3   Home Layout: One level Home Equipment: Tub bench;Rolling Walker (2 wheels);Cane - single point;BSC Additional Comments: ambulated usually with SPC or no AD without assistance in the home and [facility] outside of the home.    Prior Function Prior Level of Function : Needs assist (no driving for past couple of years, no falls in last 6 months)       Physical Assist : ADLs (physical)   ADLs (physical): Bathing;Dressing;IADLs;Grooming         Hand Dominance   Dominant Hand: Right    Extremity/Trunk Assessment   Upper Extremity Assessment Upper Extremity Assessment: Generalized weakness    Lower Extremity Assessment Lower Extremity Assessment: Generalized weakness    Cervical / Trunk Assessment Cervical / Trunk Assessment: Normal  Communication   Communication: No  difficulties  Cognition Arousal/Alertness: Awake/alert Behavior During Therapy: WFL for tasks assessed/performed Overall Cognitive Status: Within Functional Limits for tasks assessed                                          General Comments General comments (skin integrity, edema, etc.): Reports she "feels odd" and "feels disoriented" in general and reports "feeling warm" immediately after ambulation. Vitals remained WFL throughout mobility attempt.    Exercises Other Exercises Other Exercises: educated on role of PT in acute care setting.   Assessment/Plan    PT Assessment Patient needs continued PT services  PT Problem List Decreased strength;Decreased activity tolerance;Decreased balance;Decreased mobility;Decreased knowledge of use of DME       PT Treatment Interventions DME instruction;Balance training;Gait training;Neuromuscular re-education;Stair training;Functional mobility training;Cognitive remediation;Patient/family education;Therapeutic activities;Therapeutic exercise    PT Goals (Current goals can be found in the Care Plan section)  Acute Rehab PT Goals Patient Stated Goal: feel better PT Goal Formulation: With patient Time For Goal Achievement: 12/29/20 Potential to Achieve Goals: Good    Frequency Min 2X/week   Barriers to discharge   patient states patients must be mobile to be at her group home    Co-evaluation               AM-PAC PT "6 Clicks" Mobility  Outcome Measure Help needed turning from your back to your  side while in a flat bed without using bedrails?: None Help needed moving from lying on your back to sitting on the side of a flat bed without using bedrails?: None Help needed moving to and from a bed to a chair (including a wheelchair)?: A Little Help needed standing up from a chair using your arms (e.g., wheelchair or bedside chair)?: A Little Help needed to walk in hospital room?: A Little Help needed climbing 3-5 steps  with a railing? : A Little 6 Click Score: 20    End of Session Equipment Utilized During Treatment: Gait belt Activity Tolerance: Patient limited by fatigue (reports "feeling warm" after ambulation and "disoriented/odd" in general) Patient left: in chair;with call bell/phone within reach;with chair alarm set Nurse Communication: Mobility status PT Visit Diagnosis: Unsteadiness on feet (R26.81);Difficulty in walking, not elsewhere classified (R26.2);Muscle weakness (generalized) (M62.81)    Time: 0940-1005 PT Time Calculation (min) (ACUTE ONLY): 25 min   Charges:   PT Evaluation $PT Eval Moderate Complexity: 1 Mod         Tyheim Vanalstyne R. Ilsa Iha, PT, DPT 12/15/20, 10:27 AM
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
## 2020-12-15 NOTE — Progress Notes (Signed)
[facility] Progress Note  Patient: Shannon Chen    YKZ:[id number]  DOA: 12/12/2020     Date of Service: the patient was seen and examined on 12/15/2020  Chief Complaint  Patient presents with   Near Syncope   Brief hospital course: The patient is a 75 yr old woman with PMH of CAD, remote CVA, HTN, HLD, HCM, s/p PPM,  carotid stenosis s/p R CEA, arthritis, who presented to [facility] ED with complaints of lightheadedness, dizziness, and diaphoresis. She denies chest pain or recent history of chest pain. She did not pass out nor did she feel that she was going to pass out. The duration of her symptoms were 10 minutes. No nausea or vomiting. It seems that she has had several of these episodes are in the last couple of months. She has sought help from her PCP for these episodes, but no cause was found.    EKG in the ED was unchanged from previous from 10/30/2020. CT head demonstrated no acute pathology to explain symptoms. CXR also demonstrated no acute pathology. Troponins were 2926 to 4062 to 3313.  Patient was found to have NSTEMI, cardiology consulted.   [facility] have been consulted to admit the patient for further evaluation and care. Cardiology has been consulted.    The patient denies fevers, chills, cough, cough productive of sputum, nausea, vomiting, syncope, swelling, shortness of breath, sores, rashes, lesions, neurological changes other than dizziness, hematochezia, melena, or coffee ground emesis.   Assessment and Plan:  NSTEMI (non-ST elevated myocardial infarction) ([name]) With troponins of 2926, 4062, and 3313. Continue to monitor. Unable to evaluate ischemia by EKG due to presence of RBBB. This was present on previous EKG. Patient was started on heparin IV infusion for 48 hours till 14:52 on 10/28 Continue aspirin 80 mg daily and nitroglycerin as needed Cardiology consulted. S/p cardiac cath, moderate to severe calcification of all epicardial coronary arteries,  tortuous arteries, mid RCA lesion 70% stenosed. Heavily calcified coronary arteries.  Cardiology recommended aggressive risk factor modification for extensive calcified CAD. Continue heparin IV infusion for 48 hours, and continue aspirin Toprol and Crestor. -If refractory symptoms despite optimization of medications, consider functional study to assess RCA stenosis -Post cath instructions -Ambulate     AKI on CKD stage III Baseline creatinine 0.9, 6 months ago, creatinine elevated on admission Continue monitor renal functions and urine output Started gentle hydration NS 75 mill per hour Cr 1.24--1.12 --0.99  Hypertension The patient is normotensive on her home dosage of metoprolol. Monitor.   Hypertrophic cardiomyopathy  Noted. Echocardiogram is being repeated. Cardiology consulted.   LVH (left ventricular hypertrophy) due to hypertensive disease, without heart failure Noted. Continue antihypertensives. Echocardiogram has been ordered.     RBBB Noted. Present on previous EKG's. Unable to evaluate ischemia from EKG due to this finding.   Type 2 diabetes mellitus with vascular disease ([name]) Check HbA1c. It appears that the patient's glucoses are covered with farxiga alone at home. Will follow FSBS with SSI.    Body mass index is 23.3 kg/m.  Interventions:      Diet: Currently n.p.o. we will start diabetic diet once cleared by cardiology DVT Prophylaxis: Therapeutic Anticoagulation with heparin IV infusion    Advance goals of care discussion: Full code  Family Communication: family was NOT present at bedside, at the time of interview.  The pt provided permission to discuss medical plan with the family. Opportunity was given to ask question and all questions were answered satisfactorily.  Disposition:  Pt is from Home, admitted with nonspecific symptoms with NSTEMI, still has symptoms, seen by cardiology, cardiac cath pending, which precludes a safe discharge. Discharge  to home, when stable, most likely tomorrow a.m.  Subjective: No significant event overnight, since morning patient was having left-sided headache, she did not take Tylenol.  Patient was not feeling good, does not feel like she can go home today.  Patient did work with physical therapy. Denies any chest pain or palpitations, no nausea vomiting or diarrhea.     Physical Exam: General:  alert oriented to time, place, and person.  Appear in mild distress, affect appropriate Eyes: PERRLA ENT: Oral Mucosa Clear, moist  Neck: no JVD,  Cardiovascular: S1 and S2 Present, no Murmur,  Respiratory: good respiratory effort, Bilateral Air entry equal and Decreased, no Crackles, no wheezes Abdomen: Bowel Sound present, Soft and no tenderness,  Skin: no rashes Extremities: no Pedal edema, no calf tenderness Neurologic: without any new focal findings Gait not checked due to patient safety concerns  Vitals:   12/15/20 0235 12/15/20 0400 12/15/20 0745 12/15/20 1136  BP: (!) 178/78 (!) 160/71 128/72 (!) 103/55  Pulse:   71 69  Resp: 15 18 18 18   Temp:  98.6 F (37 C) 97.7 F (36.5 C) 97.8 F (36.6 C)  TempSrc:  Oral    SpO2:  100% 98% 99%  Weight:      Height:        Intake/Output Summary (Last 24 hours) at 12/15/2020 1342 Last data filed at 12/15/2020 1017 Gross per 24 hour  Intake 720 ml  Output 150 ml  Net 570 ml   Filed Weights   12/12/20 0821  Weight: 63.5 kg    Data Reviewed: I have personally reviewed and interpreted daily labs, tele strips, imagings as discussed above. I reviewed all nursing notes, pharmacy notes, vitals, pertinent old records I have discussed plan of care as described above with RN and patient/family.  CBC: Recent Labs  Lab 12/12/20 0828 12/13/20 0536 12/14/20 0521 12/15/20 0529  WBC 4.1 3.9* 3.7* 3.9*  HGB 13.1 12.1 11.7* 12.2  HCT 40.1 38.2 35.7* 37.2  MCV 84.8 83.8 84.8 83.6  PLT 163 156 144* 148*   Basic Metabolic Panel: Recent Labs  Lab  12/12/20 0828 12/12/20 1206 12/13/20 0536 12/14/20 0521 12/15/20 0529  NA 138  --  140 140 139  K 3.7  --  4.2 3.8 3.8  CL 108  --  111 108 112*  CO2 24  --  26 23 24   GLUCOSE 136*  --  107* 96 105*  BUN 26*  --  29* 28* 25*  CREATININE 1.41*  --  1.24* 1.12* 0.99  CALCIUM 9.4  --  9.2 8.7* 9.3  MG  --  2.2  --  1.9 2.0  PHOS  --   --  4.6 4.2 3.5    Studies: No results found.  Scheduled Meds:  aspirin EC  81 mg Oral Daily   clopidogrel  75 mg Oral Daily   insulin aspart  0-9 Units Subcutaneous TID WC   metoprolol succinate  25 mg Oral BID   [START ON 12/16/2020] metoprolol succinate  50 mg Oral Daily   mirtazapine  7.5 mg Oral QHS   rosuvastatin  40 mg Oral Daily   sodium chloride flush  3 mL Intravenous Q12H   Vibegron  75 mg Oral Daily   Continuous Infusions:  sodium chloride     PRN Meds: sodium chloride,  acetaminophen, hydrALAZINE, nitroGLYCERIN, ondansetron (ZOFRAN) IV, sodium chloride flush, traMADol  Time spent: 35 minutes  Author: Gillis Santa. MD [facility] 12/15/2020 1:42 PM  To reach On-call, see care teams to locate the attending and reach out to them via [email or web address]. If 7PM-7AM, please contact night-coverage If you still have difficulty reaching the attending provider, please page the [facility] (Director on Call) for [facility] on amion for assistance.

## 2020-12-15 NOTE — Progress Notes (Signed)
OT Cancellation Note  Patient Details Name: Shannon Chen MRN: [id number] DOB: 1945/09/16   Cancelled Treatment:    Reason Eval/Treat Not Completed: Patient declined, no reason specified;Fatigue/lethargy limiting ability to participate. OT order received, chart reviewed. RN cleared pt for participation in therapy, however, upon OT arrival pt sleeping soundly in bed. Pt wakes briefly to verbal cues. When asked if she would be willing to participate in OT evaluation, pt states "I would rather not". She promptly appears to return to sleep and does not wake to further VCs. Will hold at this time and re-attempt at a later date/time as available and pt medically appropriate.   Rockney Ghee, M.S., OTR/L Ascom: [phone] 12/15/20, 2:56 PM   Lariza Cothron Smith Robert 12/15/2020, 2:54 PM

## 2020-12-15 NOTE — Progress Notes (Signed)
Progress Note  Patient Name: Shannon Chen Date of Encounter: 12/15/2020  Primary Cardiologist: [name]  Subjective   No chest pain or dyspnea. Notes a headache this morning that feels typical of her usual headaches. Labs and vitals stable.   Inpatient Medications    Scheduled Meds:  aspirin EC  81 mg Oral Daily   clopidogrel  75 mg Oral Daily   insulin aspart  0-9 Units Subcutaneous TID WC   metoprolol succinate  25 mg Oral BID   mirtazapine  7.5 mg Oral QHS   rosuvastatin  40 mg Oral Daily   sodium chloride flush  3 mL Intravenous Q12H   Vibegron  75 mg Oral Daily   Continuous Infusions:  sodium chloride     sodium chloride 75 mL/hr at 12/15/20 0406   PRN Meds: sodium chloride, acetaminophen, hydrALAZINE, nitroGLYCERIN, ondansetron (ZOFRAN) IV, sodium chloride flush, traMADol   Vital Signs    Vitals:   12/15/20 0133 12/15/20 0235 12/15/20 0400 12/15/20 0745  BP: (!) 167/71 (!) 178/78 (!) 160/71 128/72  Pulse: 63   71  Resp: 16 15 18 18   Temp: 98.5 F (36.9 C)  98.6 F (37 C) 97.7 F (36.5 C)  TempSrc:   Oral   SpO2: 99%  100% 98%  Weight:      Height:        Intake/Output Summary (Last 24 hours) at 12/15/2020 0838 Last data filed at 12/14/2020 2100 Gross per 24 hour  Intake 1164.08 ml  Output 600 ml  Net 564.08 ml    Filed Weights   12/12/20 0821  Weight: 63.5 kg    Telemetry    SR - Personally Reviewed  ECG    No new tracings - Personally Reviewed  Physical Exam   GEN: No acute distress.   Neck: No JVD. Cardiac: RRR, no murmurs, rubs, or gallops. Right radial artery arteriotomy site without active bleeding, swelling, warmth, bruising, or TTP. Radial pulse 2+ proximal and distal to the arteriotomy site.  Respiratory: Clear to auscultation bilaterally.  GI: Soft, nontender, non-distended.   MS: No edema; No deformity. Neuro:  Alert and oriented x 3; Nonfocal.  Psych: Normal affect.  Labs    Chemistry Recent Labs  Lab  12/13/20 0536 12/14/20 0521 12/15/20 0529  NA 140 140 139  K 4.2 3.8 3.8  CL 111 108 112*  CO2 26 23 24   GLUCOSE 107* 96 105*  BUN 29* 28* 25*  CREATININE 1.24* 1.12* 0.99  CALCIUM 9.2 8.7* 9.3  GFRNONAA 45* 51* 59*  ANIONGAP 3* 9 3*      Hematology Recent Labs  Lab 12/13/20 0536 12/14/20 0521 12/15/20 0529  WBC 3.9* 3.7* 3.9*  RBC 4.56 4.21 4.45  HGB 12.1 11.7* 12.2  HCT 38.2 35.7* 37.2  MCV 83.8 84.8 83.6  MCH 26.5 27.8 27.4  MCHC 31.7 32.8 32.8  RDW 13.9 13.9 13.8  PLT 156 144* 148*     Cardiac EnzymesNo results for input(s): TROPONINI in the last 168 hours. No results for input(s): TROPIPOC in the last 168 hours.   BNP Recent Labs  Lab 12/12/20 0828  BNP 12.3      DDimer No results for input(s): DDIMER in the last 168 hours.   Radiology      Cardiac Studies   LHC 12/13/2020:   Moderate to Severe calcification of all epicardial coronary arteries; tortuous arteries.   Mid RCA lesion is 70% stenosed.  (Very discrete lesion, questionable significance.  Range from  30 to 90% depending on image)   Dist Cx lesion is 30% stenosed.   LV end diastolic pressure is normal.   There is no aortic valve stenosis.   Post-Operative Diagnoses: Heavily calcified coronary arteries with diffuse mild to moderate calcific stenosis throughout.  Potential culprit lesion is a possible focal lesion in the mid RCA questionable significance.   In some images, there appears to be minimal stenosis whereas another images it appears to be severe.  Likely heavily calcified vessel with hazy appearance because of calcification. =   This lesion is in a very tortuous heavily calcified vessel, not able to advance FFR wired halfway to the lesion.  Likely not very amenable to PCI. Despite having Hypertrophic Cardiomyopathy, the LVEDP was normal at 14 mmHg. ->  LV gram not performed in order to conserve contrast.     Recommendations: Aggressive risk factor modification for extensive  calcified CAD. If the patient does have active anginal symptoms, could consider noninvasive evaluation of the RCA to determine if this is significant.  Would need to review with interventional colleagues to determine if there is any potential option as the entire vessel is calcified, very tortuous making it highly unlikely to be actually successfully treated.  Would definitely need to be done by the femoral access due to the 270  turn in the innominate artery __________  2D echo 12/12/2020: 1. Left ventricular ejection fraction, by estimation, is 60 to 65%. The  left ventricle has normal function. The left ventricle has no regional  wall motion abnormalities. There is severe left ventricular hypertrophy.  Left ventricular diastolic parameters   are indeterminate.   2. Right ventricular systolic function is normal. The right ventricular  size is normal. Tricuspid regurgitation signal is inadequate for assessing  PA pressure.   3. The mitral valve is normal in structure. Mild mitral valve  regurgitation. No evidence of mitral stenosis.   4. The aortic valve is normal in structure. Aortic valve regurgitation is  not visualized. Mild to moderate aortic valve sclerosis/calcification is  present, without any evidence of aortic stenosis.   5. The inferior vena cava is normal in size with greater than 50%  respiratory variability, suggesting right atrial pressure of 3 mmHg.  Patient Profile     75 y.o. female with history of HCM/severe LVH, carotid artery stenosis s/p right-sided CEA, syncope, labile hypertension, heart block s/p PP< in 09/2016, and prior tobacco use who we are seeing for NSTEMI.  Assessment & Plan    1. CAD involving the native coronary arteries with NSTEMI: -No chest pain or dyspnea -Status post therapy with heparin gtt -ASA -Toprol -Crestor -LHC with medical management given complexity of CAD as outlined above -If refractory symptoms despite optimization of medications,  consider functional study to assess RCA stenosis -Post cath instructions -Ambulate   2. HCM/severe LVH: -Echo as above -No syncope -Continue titrated dose of Toprol XL  3. 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 Carotid artery disease: -Status post right-sided CEA -ASA -Crestor   5. HLD: -LDL 61 this admission -Crestor   6. AKI: -Improved   For questions or updates, please contact [facility] Please consult [email or web address] for contact info under Cardiology/STEMI.    Signed, Eula Listen, PA-C [facility] Pager: [phone] 12/15/2020, 8:38 AM

## 2020-12-16 DIAGNOSIS — I214 Non-ST elevation (NSTEMI) myocardial infarction: Secondary | ICD-10-CM | POA: Diagnosis not present

## 2020-12-16 LAB — BASIC METABOLIC PANEL
Anion gap: 7 (ref 5–15)
BUN: 30 mg/dL — ABNORMAL HIGH (ref 8–23)
CO2: 22 mmol/L (ref 22–32)
Calcium: 8.9 mg/dL (ref 8.9–10.3)
Chloride: 109 mmol/L (ref 98–111)
Creatinine, Ser: 1.05 mg/dL — ABNORMAL HIGH (ref 0.44–1.00)
GFR, Estimated: 55 mL/min — ABNORMAL LOW (ref >60)
Glucose, Bld: 114 mg/dL — ABNORMAL HIGH (ref 70–99)
Potassium: 3.9 mmol/L (ref 3.5–5.1)
Sodium: 138 mmol/L (ref 135–145)

## 2020-12-16 LAB — CBC
HCT: 33.6 % — ABNORMAL LOW (ref 36.0–46.0)
Hemoglobin: 11.4 g/dL — ABNORMAL LOW (ref 12.0–15.0)
MCH: 28.1 pg (ref 26.0–34.0)
MCHC: 33.9 g/dL (ref 30.0–36.0)
MCV: 82.8 fL (ref 80.0–100.0)
Platelets: 128 10*3/uL — ABNORMAL LOW (ref 150–400)
RBC: 4.06 MIL/uL (ref 3.87–5.11)
RDW: 13.9 % (ref 11.5–15.5)
WBC: 3.5 10*3/uL — ABNORMAL LOW (ref 4.0–10.5)
nRBC: 0 % (ref 0.0–0.2)

## 2020-12-16 LAB — GLUCOSE, CAPILLARY
Glucose-Capillary: 101 mg/dL — ABNORMAL HIGH (ref 70–99)
Glucose-Capillary: 101 mg/dL — ABNORMAL HIGH (ref 70–99)
Glucose-Capillary: 115 mg/dL — ABNORMAL HIGH (ref 70–99)
Glucose-Capillary: 125 mg/dL — ABNORMAL HIGH (ref 70–99)

## 2020-12-16 LAB — PHOSPHORUS: Phosphorus: 4.4 mg/dL (ref 2.5–4.6)

## 2020-12-16 LAB — MAGNESIUM: Magnesium: 2.1 mg/dL (ref 1.7–2.4)

## 2020-12-16 MED ORDER — CLOPIDOGREL BISULFATE 75 MG PO TABS: 75.0000 mg | ORAL_TABLET | Freq: Every day | ORAL | 2 refills | Status: AC

## 2020-12-16 MED ORDER — METOPROLOL SUCCINATE ER 50 MG PO TB24: 50.0000 mg | ORAL_TABLET | Freq: Every day | ORAL | 2 refills | Status: DC

## 2020-12-16 NOTE — Evaluation (Signed)
Occupational Therapy Evaluation Patient Details Name: Shannon Chen MRN: [id number] DOB: 1945/04/12 Today's Date: 12/16/2020   History of Present Illness Shannon Chen is a 75 y.o. female with PMH of CAD, remote CVA, HTN, HLD, hypertrophic cardiomyopathy, s/p permanent pacemaker, carotid stenosis s/p R carotid endarterectomy, arthritis, who presented to [facility] ED with complaints of lightheadedness, dizziness, and diaphoresis. Troponins were 2926 to 4062 to 3313.  Patient was found to have NSTEMI (RCA lesion on cath of questionable significance); per cardiology, to be managed medically.   Clinical Impression   Pt seen for OT evaluation this date. Prior to admission, pt was residing in a group home using a SPC/RW for functional mobility. Pt reports that she was independent with ADLs at baseline, but required assistance for IADLs. Pt currently presents with decreased strength and activity tolerance. Due to these current functional impairments, pt requires SUPERVISION for functional mobility of short household distances with RW, MIN GUARD for toilet transfers, and SUPERVISION for standing grooming tasks. Following walk to/from bathroom, pt reporting fatigue (HR 76). Pt would benefit from additional skilled OT services to facilitate implementation of energy conservation into daily routines and maximize return to PLOF. Upon discharge, recommend HHOT services (although pt is currently refusing at this time).     Recommendations for follow up therapy are one component of a multi-disciplinary discharge planning process, led by the attending physician.  Recommendations may be updated based on patient status, additional functional criteria and insurance authorization.   Follow Up Recommendations  Home health OT    Assistance Recommended at Discharge Intermittent Supervision/Assistance  Functional Status Assessment  Patient has had a recent decline in their functional status and demonstrates the ability to  make significant improvements in function in a reasonable and predictable amount of time.  Equipment Recommendations  None recommended by OT       Precautions / Restrictions Precautions Precautions: Fall Restrictions Weight Bearing Restrictions: No      Mobility Bed Mobility               General bed mobility comments: not assessed, pt in recliner at beginning/end of session    Transfers Overall transfer level: Needs assistance Equipment used: Rolling walker (2 wheels) Transfers: Sit to/from Stand Sit to Stand: Min guard;Supervision           General transfer comment: MIN GUARD from toilet (d/t low height) and SUPERVISION from recliner      Balance Overall balance assessment: Needs assistance Sitting-balance support: Bilateral upper extremity supported;Feet supported Sitting balance-Leahy Scale: Good Sitting balance - Comments: Good sitting balance reaching outside BOS to don/doff socks   Standing balance support: No upper extremity supported;During functional activity Standing balance-Leahy Scale: Fair Standing balance comment: MIN GUARD for standing grooming tasks                           ADL either performed or assessed with clinical judgement   ADL Overall ADL's : Needs assistance/impaired Eating/Feeding: Independent;Sitting   Grooming: Wash/dry hands;Supervision/safety;Standing           Upper Body Dressing : Supervision/safety;Set up;Sitting Upper Body Dressing Details (indicate cue type and reason): to don/doff hospital gown Lower Body Dressing: Min guard;Sitting/lateral leans Lower Body Dressing Details (indicate cue type and reason): to don/doff socks while seated in recliner Toilet Transfer: Min guard;Ambulation;Regular Toilet;Grab bars   Toileting- Clothing Manipulation and Hygiene: Supervision/safety;Set up;Sitting/lateral lean       Functional mobility during ADLs: Supervision/safety;Rolling walker (2  wheels) (to walk  ~61ft)        Pertinent Vitals/Pain Pain Assessment: No/denies pain     Hand Dominance Right   Extremity/Trunk Assessment Upper Extremity Assessment Upper Extremity Assessment: Generalized weakness   Lower Extremity Assessment Lower Extremity Assessment: Generalized weakness       Communication Communication Communication: No difficulties   Cognition Arousal/Alertness: Awake/alert Behavior During Therapy: Flat affect Overall Cognitive Status: No family/caregiver present to determine baseline cognitive functioning                                 General Comments: Alert and oriented to self, place, month/year, and situation. Demonstrates some decreased awareness of deficits, however agreeable throughout session     General Comments  Pt reports feeling fatigued following walk to/from bathroom and following toilet transfer/hygiene            Home Living Family/patient expects to be discharged to:: Group home (lives in group home with 5 other residents)   Available Help at Discharge: Available 24 hours/day (group home staff) Type of Home: Group Home Home Access: Stairs to enter Entrance Stairs-Number of Steps: 2-3 Entrance Stairs-Rails: Right Home Layout: One level     Bathroom Shower/Tub: Tub/shower unit (with seat)   Bathroom Toilet: Standard Bathroom Accessibility:  (unsure)   Home Equipment: Tub bench;Rolling Walker (2 wheels);Cane - single point;BSC   Additional Comments: ambulated usually with SPC or no AD without assistance in the home and [facility] outside of the home.      Prior Functioning/Environment Prior Level of Function : Needs assist (no driving for past couple of years, no falls in last 6 months)       Physical Assist : ADLs (physical)   ADLs (physical): IADLs   ADLs Comments: Pt reports being independent with feeding, dressing, grooming, and bathing at baseline (unclear, as chart review reports that she received assist for ADLs  prior to admission). Pt reports that staff at group home manage her IADLs        OT Problem List: Decreased strength;Decreased activity tolerance;Impaired balance (sitting and/or standing);Decreased safety awareness      OT Treatment/Interventions: Self-care/ADL training;Therapeutic exercise;Energy conservation;DME and/or AE instruction;Therapeutic activities;Patient/family education;Balance training    OT Goals(Current goals can be found in the care plan section) Acute Rehab OT Goals Patient Stated Goal: to get back home OT Goal Formulation: With patient Time For Goal Achievement: 12/30/20 Potential to Achieve Goals: Good ADL Goals Pt Will Perform Lower Body Bathing: with set-up;with supervision;sit to/from stand Pt Will Transfer to Toilet: with modified independence;ambulating;regular height toilet;grab bars Additional ADL Goal #1: Pt will independently initiate x1 energy conservation strategie during functional mobility/ADLs  OT Frequency: Min 1X/week    AM-PAC OT "6 Clicks" Daily Activity     Outcome Measure Help from another person eating meals?: None Help from another person taking care of personal grooming?: A Little Help from another person toileting, which includes using toliet, bedpan, or urinal?: A Little Help from another person bathing (including washing, rinsing, drying)?: A Little Help from another person to put on and taking off regular upper body clothing?: None Help from another person to put on and taking off regular lower body clothing?: A Little 6 Click Score: 20   End of Session Equipment Utilized During Treatment: Rolling walker (2 wheels) Nurse Communication: Mobility status  Activity Tolerance: Patient tolerated treatment well Patient left: in chair;with call bell/phone within reach;with chair alarm set  OT Visit Diagnosis: Unsteadiness on feet (R26.81);Muscle weakness (generalized) (M62.81)                Time: 9166-0600 OT Time Calculation (min): 16  min Charges:  OT General Charges $OT Visit: 1 Visit OT Evaluation $OT Eval Moderate Complexity: 1 Mod OT Treatments $Self Care/Home Management : 8-22 mins  Matthew Folks, OTR/L [facility] [phone]

## 2020-12-16 NOTE — TOC Transition Note (Addendum)
Transition of Care [facility]) - CM/SW Discharge Note   Patient Details  Name: Shannon Chen MRN: [id number] Date of Birth: Nov 24, 1945  [facility] CM/SW Contact:  Liliana Cline, LCSW Phone Number: 12/16/2020, 1:51 PM   Clinical Narrative:   Patient resides at [facility] now. Spoke to Blanket at [facility]. Informed her of patient being DC today. She will call me back regarding whether they can provide transport.  1:54- Return call from [facility]. Daphne reported she spoke to her Director Janace Aris who stated patient cannot return until Monday morning and the group home can pick patient up. Informed Daphne patient is medically ready and I can arrange a taxi for her to come back today (MD says patient is appropriate for taxi transport). Daphne stated they cannot take patient back today even if I set up a ride.      Final next level of care: Group Home Barriers to Discharge: Barriers Resolved   Patient Goals and CMS Choice Patient states their goals for this hospitalization and ongoing recovery are:: return to group home CMS Medicare.gov Compare Post Acute Care list provided to:: Patient Choice offered to / list presented to : Patient  Discharge Placement                    Patient and family notified of of transfer: 12/16/20  Discharge Plan and Services                                     Social Determinants of Health (SDOH) Interventions     Readmission Risk Interventions Readmission Risk Prevention Plan 12/15/2020  Transportation Screening Complete  PCP or Specialist Appt within 5-7 Days Complete  Home Care Screening Complete  Medication Review (RN CM) Complete  Some recent data might be hidden

## 2020-12-16 NOTE — Progress Notes (Signed)
[facility] Progress Note  Patient: Shannon Chen    RAX:[id number]  DOA: 12/12/2020     Date of Service: the patient was seen and examined on 12/16/2020  Chief Complaint  Patient presents with   Near Syncope   Brief hospital course: The patient is a 75 yr old woman with PMH of CAD, remote CVA, HTN, HLD, HCM, s/p PPM,  carotid stenosis s/p R CEA, arthritis, who presented to [facility] ED with complaints of lightheadedness, dizziness, and diaphoresis. She denies chest pain or recent history of chest pain. She did not pass out nor did she feel that she was going to pass out. The duration of her symptoms were 10 minutes. No nausea or vomiting. It seems that she has had several of these episodes are in the last couple of months. She has sought help from her PCP for these episodes, but no cause was found.    EKG in the ED was unchanged from previous from 10/30/2020. CT head demonstrated no acute pathology to explain symptoms. CXR also demonstrated no acute pathology. Troponins were 2926 to 4062 to 3313.  Patient was found to have NSTEMI, cardiology consulted.   [facility] have been consulted to admit the patient for further evaluation and care. Cardiology has been consulted.    The patient denies fevers, chills, cough, cough productive of sputum, nausea, vomiting, syncope, swelling, shortness of breath, sores, rashes, lesions, neurological changes other than dizziness, hematochezia, melena, or coffee ground emesis.   Assessment and Plan:  NSTEMI (non-ST elevated myocardial infarction) ([name]) With troponins of 2926, 4062, and 3313. Continue to monitor. Unable to evaluate ischemia by EKG due to presence of RBBB. This was present on previous EKG. Patient was started on heparin IV infusion for 48 hours till 14:52 on 10/28 Continue aspirin 80 mg daily and nitroglycerin as needed Cardiology consulted. S/p cardiac cath, moderate to severe calcification of all epicardial coronary arteries,  tortuous arteries, mid RCA lesion 70% stenosed. Heavily calcified coronary arteries.  Cardiology recommended aggressive risk factor modification for extensive calcified CAD. Continue heparin IV infusion for 48 hours, and continue aspirin Toprol and Crestor. -If refractory symptoms despite optimization of medications, consider functional study to assess RCA stenosis -Post cath instructions -Ambulate     AKI on CKD stage III Baseline creatinine 0.9, 6 months ago, creatinine elevated on admission Continue monitor renal functions and urine output Started gentle hydration NS 75 mill per hour Cr 1.24--1.12 --0.99  Hypertension The patient is normotensive on her home dosage of metoprolol. Monitor.   Hypertrophic cardiomyopathy  Noted. Echocardiogram is being repeated. Cardiology consulted.   LVH (left ventricular hypertrophy) due to hypertensive disease, without heart failure Noted. Continue antihypertensives. Echocardiogram has been ordered.     RBBB Noted. Present on previous EKG's. Unable to evaluate ischemia from EKG due to this finding.   Type 2 diabetes mellitus with vascular disease ([name]) Check HbA1c. It appears that the patient's glucoses are covered with farxiga alone at home. Will follow FSBS with [facility].    Body mass index is 23.3 kg/m.  Interventions:      Diet: Currently n.p.o. we will start diabetic diet once cleared by cardiology DVT Prophylaxis: Therapeutic Anticoagulation with heparin IV infusion    Advance goals of care discussion: Full code  Family Communication: family was NOT present at bedside, at the time of interview.  The pt provided permission to discuss medical plan with the family. Opportunity was given to ask question and all questions were answered satisfactorily.  Disposition:  Pt is from Home, admitted with nonspecific symptoms with NSTEMI, still has symptoms, seen by cardiology, cardiac cath pending, which precludes a safe discharge. Discharge  to group home tomorrow a.m.  Patient is medically optimized and stable today but group home will not accept her so plan is to discharge her tomorrow a.m.    Subjective: No significant event overnight, yesterday patient had a headache which resolved after Tylenol, no more headache.  Patient denied any chest pain or palpitations.  Patient feels like she can go back to the group home but as per Child psychotherapist they are not excepting patient today so plan is to discharge her to group home tomorrow a.m.    Physical Exam: General:  alert oriented to time, place, and person.  Appear in mild distress, affect appropriate Eyes: PERRLA ENT: Oral Mucosa Clear, moist  Neck: no JVD,  Cardiovascular: S1 and S2 Present, no Murmur,  Respiratory: good respiratory effort, Bilateral Air entry equal and Decreased, no Crackles, no wheezes Abdomen: Bowel Sound present, Soft and no tenderness,  Skin: no rashes Extremities: no Pedal edema, no calf tenderness Neurologic: without any new focal findings Gait not checked due to patient safety concerns  Vitals:   12/15/20 2329 12/16/20 0335 12/16/20 0829 12/16/20 1152  BP: (!) 157/72 (!) 111/50 140/67 117/62  Pulse: 66 60 60 (!) 59  Resp: 16 16 18 20   Temp: 98.2 F (36.8 C) (!) 97.4 F (36.3 C) 98 F (36.7 C) 98.7 F (37.1 C)  TempSrc:   Oral   SpO2: 97% 96% 100% 98%  Weight:      Height:        Intake/Output Summary (Last 24 hours) at 12/16/2020 1431 Last data filed at 12/16/2020 1014 Gross per 24 hour  Intake 240 ml  Output 850 ml  Net -610 ml   Filed Weights   12/12/20 0821  Weight: 63.5 kg    Data Reviewed: I have personally reviewed and interpreted daily labs, tele strips, imagings as discussed above. I reviewed all nursing notes, pharmacy notes, vitals, pertinent old records I have discussed plan of care as described above with RN and patient/family.  CBC: Recent Labs  Lab 12/12/20 0828 12/13/20 0536 12/14/20 0521 12/15/20 0529  12/16/20 0541  WBC 4.1 3.9* 3.7* 3.9* 3.5*  HGB 13.1 12.1 11.7* 12.2 11.4*  HCT 40.1 38.2 35.7* 37.2 33.6*  MCV 84.8 83.8 84.8 83.6 82.8  PLT 163 156 144* 148* 128*   Basic Metabolic Panel: Recent Labs  Lab 12/12/20 0828 12/12/20 1206 12/13/20 0536 12/14/20 0521 12/15/20 0529 12/16/20 0541  NA 138  --  140 140 139 138  K 3.7  --  4.2 3.8 3.8 3.9  CL 108  --  111 108 112* 109  CO2 24  --  26 23 24 22   GLUCOSE 136*  --  107* 96 105* 114*  BUN 26*  --  29* 28* 25* 30*  CREATININE 1.41*  --  1.24* 1.12* 0.99 1.05*  CALCIUM 9.4  --  9.2 8.7* 9.3 8.9  MG  --  2.2  --  1.9 2.0 2.1  PHOS  --   --  4.6 4.2 3.5 4.4    Studies: No results found.  Scheduled Meds:  aspirin EC  81 mg Oral Daily   clopidogrel  75 mg Oral Daily   insulin aspart  0-9 Units Subcutaneous TID WC   metoprolol succinate  50 mg Oral Daily   mirtazapine  7.5 mg Oral QHS  rosuvastatin  40 mg Oral Daily   sodium chloride flush  3 mL Intravenous Q12H   Vibegron  75 mg Oral Daily   Continuous Infusions:  sodium chloride     PRN Meds: sodium chloride, acetaminophen, hydrALAZINE, nitroGLYCERIN, ondansetron (ZOFRAN) IV, sodium chloride flush, traMADol  Time spent: 35 minutes  Author: Gillis Santa. MD [facility] 12/16/2020 2:31 PM  To reach On-call, see care teams to locate the attending and reach out to them via [email or web address]. If 7PM-7AM, please contact night-coverage If you still have difficulty reaching the attending provider, please page the [facility] (Director on Call) for [facility] on amion for assistance.

## 2020-12-17 DIAGNOSIS — I214 Non-ST elevation (NSTEMI) myocardial infarction: Secondary | ICD-10-CM | POA: Diagnosis not present

## 2020-12-17 LAB — BASIC METABOLIC PANEL
Anion gap: 7 (ref 5–15)
BUN: 38 mg/dL — ABNORMAL HIGH (ref 8–23)
CO2: 23 mmol/L (ref 22–32)
Calcium: 9.3 mg/dL (ref 8.9–10.3)
Chloride: 109 mmol/L (ref 98–111)
Creatinine, Ser: 1.04 mg/dL — ABNORMAL HIGH (ref 0.44–1.00)
GFR, Estimated: 55 mL/min — ABNORMAL LOW (ref >60)
Glucose, Bld: 105 mg/dL — ABNORMAL HIGH (ref 70–99)
Potassium: 4.1 mmol/L (ref 3.5–5.1)
Sodium: 139 mmol/L (ref 135–145)

## 2020-12-17 LAB — CBC
HCT: 37.4 % (ref 36.0–46.0)
Hemoglobin: 12 g/dL (ref 12.0–15.0)
MCH: 26.3 pg (ref 26.0–34.0)
MCHC: 32.1 g/dL (ref 30.0–36.0)
MCV: 82 fL (ref 80.0–100.0)
Platelets: 147 10*3/uL — ABNORMAL LOW (ref 150–400)
RBC: 4.56 MIL/uL (ref 3.87–5.11)
RDW: 13.9 % (ref 11.5–15.5)
WBC: 4.2 10*3/uL (ref 4.0–10.5)
nRBC: 0 % (ref 0.0–0.2)

## 2020-12-17 LAB — GLUCOSE, CAPILLARY: Glucose-Capillary: 119 mg/dL — ABNORMAL HIGH (ref 70–99)

## 2020-12-17 NOTE — Discharge Summary (Signed)
[facility] Discharge Summary   Patient: Shannon Chen ZOX:[id number]  PCP: Armando Gang, FNP  Date of admission: 12/12/2020   Date of discharge:  12/17/2020     Discharge Diagnoses:  Principal diagnosis NSTEMI (non-ST elevated myocardial infarction) [facility]) Active Problems:   Hypertension   LVH (left ventricular hypertrophy) due to hypertensive disease, without heart failure   RBBB   Hypertrophic cardiomyopathy ([name])   Type 2 diabetes mellitus with vascular disease ([name])   NSTEMI (non-ST elevated myocardial infarction) ([name])   Admitted From: Group home Disposition: Group home  Recommendations for Outpatient Follow-up:  PCP: in 1 wk F/u Cardio in 1-2 wks Follow up LABS/TEST:     Diet recommendation: Cardiac diet and  carb modified  Activity: The patient is advised to gradually reintroduce usual activities, as tolerated  Discharge Condition: stable  Code Status: Full code   History of present illness: As per the H and P dictated on admission Hospital Course:  The patient is a 75 yr old woman with PMH of CAD, remote CVA, HTN, HLD, HCM, s/p PPM,  carotid stenosis s/p R CEA, arthritis, who presented to [facility] ED with complaints of lightheadedness, dizziness, and diaphoresis. She denies chest pain or recent history of chest pain. She did not pass out nor did she feel that she was going to pass out. The duration of her symptoms were 10 minutes. No nausea or vomiting. It seems that she has had several of these episodes are in the last couple of months. She has sought help from her PCP for these episodes, but no cause was found.  EKG in the ED was unchanged from previous from 10/30/2020. CT head demonstrated no acute pathology to explain symptoms. CXR also demonstrated no acute pathology. Troponins were 2926 to 4062 to 3313.  Patient was found to have NSTEMI, cardiology consulted. [facility] have been consulted to admit the patient for further evaluation and care.  Cardiology has been consulted.  The patient denies fevers, chills, cough, cough productive of sputum, nausea, vomiting, syncope, swelling, shortness of breath, sores, rashes, lesions, neurological changes other than dizziness, hematochezia, melena, or coffee ground emesis.   Assessment and Plan: NSTEMI (non-ST elevated myocardial infarction) ([name]) With troponins of 2926, 4062, and 3313. Continue to monitor. Unable to evaluate ischemia by EKG due to presence of RBBB. This was present on previous EKG. Patient was started on heparin IV infusion for 48 hours till 14:52 on 10/28 Continue aspirin 81 mg daily and nitroglycerin as needed Cardiology consulted. S/p cardiac cath, moderate to severe calcification of all epicardial coronary arteries, tortuous arteries, mid RCA lesion 70% stenosed. Heavily calcified coronary arteries.  Cardiology recommended aggressive risk factor modification for extensive calcified CAD. Continue heparin IV infusion for 48 hours, and continue aspirin Toprol and Crestor.  Patient was started on Plavix 75 mg daily -If refractory symptoms despite optimization of medications, consider functional study to assess RCA stenosis.  Patient was cleared by cardiology to discharge and follow-up as an outpatient. Patient remained asymptomatic during hospital stay.  He agreed with the discharge planning.   AKI on CKD stage III Baseline creatinine 0.9, 6 months ago, creatinine elevated on admission Continue monitor renal functions and urine output S/p gentle hydration NS 19ml/hr, Cr 1.24--1.12 --0.99--1.04 Hypertension, normotensive on her home dosage of metoprolol. Monitor BP H/o Hypertrophic cardiomyopathy, repeat 2D echo shows LVEF 65%, no wall motion abnormality, no any other significant findings. LVH (left ventricular hypertrophy) due to hypertensive disease, without heart failure Continue antihypertensives.  Echocardiogram repeated  RBBB, Present on previous EKG's. Unable to evaluate  ischemia from EKG due to this finding. Type 2 diabetes mellitus with vascular disease, HbA1c 6.7, It appears that the patient's glucoses are covered with farxiga alone at home.  Resumed home meds on discharge. Body mass index is 23.3 kg/m.  Interventions:  Patient was seen by physical therapy, who recommended Home health, which was arranged. On the day of the discharge the patient's vitals were stable, and no other acute medical condition were reported by patient. the patient was felt safe to be discharge at [place] with Home health.  Consultants: Cradiology Procedures: S/p cardiac cath, moderate to severe calcification of all epicardial coronary arteries, tortuous arteries, mid RCA lesion 70% stenosed. Heavily calcified coronary arteries.   Discharge Exam: General: Appear in no distress, no Rash; Oral Mucosa Clear, moist. Cardiovascular: S1 and S2 Present, no Murmur, Respiratory: normal respiratory effort, Bilateral Air entry present and no Crackles, no wheezes Abdomen: Bowel Sound present, Soft and no tenderness, no hernia Extremities: no Pedal edema, no calf tenderness Neurology: alert and oriented to time, place, and person affect appropriate.  Filed Weights   12/12/20 0821  Weight: 63.5 kg   Vitals:   12/17/20 0530 12/17/20 0714  BP: (!) 111/59 104/63  Pulse:  67  Resp: 17 17  Temp:  98.2 F (36.8 C)  SpO2:  98%    DISCHARGE MEDICATION: Allergies as of 12/17/2020   No Known Allergies      Medication List     STOP taking these medications    amLODipine 2.5 MG tablet Commonly known as: NORVASC   busPIRone 7.5 MG tablet Commonly known as: BUSPAR   metoprolol tartrate 25 MG tablet Commonly known as: LOPRESSOR   nitrofurantoin 100 MG capsule Commonly known as: MACRODANTIN       TAKE these medications    acetaminophen 325 MG tablet Commonly known as: TYLENOL Take 650 mg by mouth every 4 (four) hours as needed.   albuterol 108 (90 Base) MCG/ACT  inhaler Commonly known as: VENTOLIN HFA Inhale 1-2 puffs into the lungs every 6 (six) hours as needed for wheezing or shortness of breath.   aspirin 81 MG EC tablet Take 1 tablet (81 mg total) by mouth daily. Swallow whole.   clopidogrel 75 MG tablet Commonly known as: PLAVIX Take 1 tablet (75 mg total) by mouth daily.   dapagliflozin propanediol 10 MG Tabs tablet Commonly known as: FARXIGA Take 10 mg by mouth daily.   Gemtesa 75 MG Tabs Generic drug: Vibegron Take 75 mg by mouth daily.   Kerendia 20 MG Tabs Generic drug: Finerenone Take 20 mg by mouth daily.   loperamide 2 MG capsule Commonly known as: IMODIUM Take 2 mg by mouth as needed.   metoprolol succinate 50 MG 24 hr tablet Commonly known as: TOPROL-XL Take 1 tablet (50 mg total) by mouth daily. What changed:  medication strength how much to take   mirtazapine 7.5 MG tablet Commonly known as: REMERON Take 7.5 mg by mouth at bedtime. What changed: Another medication with the same name was removed. Continue taking this medication, and follow the directions you see here.   polyethylene glycol 17 g packet Commonly known as: MiraLax Take 17 g by mouth daily as needed for mild constipation.   potassium chloride 10 MEQ tablet Commonly known as: KLOR-CON Take 10 mEq by mouth 2 (two) times daily.   rosuvastatin 40 MG tablet Commonly known as: CRESTOR TAKE 1 TABLET BY MOUTH  DAILY   traMADol 50 MG tablet Commonly known as: ULTRAM Take 50 mg by mouth every 6 (six) hours as needed.   Trelegy Ellipta 200-62.5-25 MCG/ACT Aepb Generic drug: Fluticasone-Umeclidin-Vilant Inhale 1 puff into the lungs daily.       No Known Allergies Discharge Instructions     Call MD for:  difficulty breathing, headache or visual disturbances   Complete by: As directed    Call MD for:  extreme fatigue   Complete by: As directed    Call MD for:  persistant dizziness or light-headedness   Complete by: As directed    Call MD  for:  severe uncontrolled pain   Complete by: As directed    Call MD for:  temperature >100.4   Complete by: As directed    Diet - low sodium heart healthy   Complete by: As directed    Discharge instructions   Complete by: As directed    Follow-up with PCP in 1 week, continue to monitor BP and follow with PCP to titrate medications accordingly. Follow with cardiology in 1 to 2 weeks   Increase activity slowly   Complete by: As directed        The results of significant diagnostics from this hospitalization (including imaging, microbiology, ancillary and laboratory) are listed below for reference.    Significant Diagnostic Studies: DG Chest 2 View  Result Date: 12/12/2020 CLINICAL DATA:  Near-syncope EXAM: CHEST - 2 VIEW COMPARISON:  Chest radiograph 05/18/2020 FINDINGS: The left chest wall cardiac device and associated leads are stable. The cardiomediastinal silhouette is stable, with unchanged calcified atherosclerotic plaque of the aortic arch. There is no focal consolidation or pulmonary edema. There is no pleural effusion or pneumothorax. There is no acute osseous abnormality. IMPRESSION: Stable chest with no radiographic evidence of acute cardiopulmonary process. Electronically Signed   By: Valetta Mole M.D.   On: 12/12/2020 13:05   CT HEAD WO CONTRAST (5MM)  Result Date: 12/12/2020 CLINICAL DATA:  Weakness, neuro deficit EXAM: CT HEAD WITHOUT CONTRAST TECHNIQUE: Contiguous axial images were obtained from the base of the skull through the vertex without intravenous contrast. COMPARISON:  CT head 06/09/2020 FINDINGS: Brain: There is no evidence of acute intracranial hemorrhage, extra-axial fluid collection, or acute infarct. There is unchanged global parenchymal volume loss with enlargement of the ventricular system. The ventricles are stable in size. 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 Additional confluent hypodensity throughout the  remainder of the subcortical and periventricular white matter is unchanged, again likely reflecting sequela of advanced chronic white matter microangiopathy. There is no mass lesion.  There is no midline shift. Vascular: There is calcification of the bilateral cavernous ICAs and vertebral arteries. Skull: Normal. Negative for fracture or focal lesion. Sinuses/Orbits: There is mild mucosal thickening in the paranasal sinuses. The globes and orbits are unremarkable. Other: There is a 1.6 cm by 1.0 cm hypodense lesion in the soft tissues of the right cheek, present since at least 2018 which may reflect a sebaceous cyst. IMPRESSION: 1. No acute intracranial pathology. 2. Unchanged global parenchymal volume loss, chronic white matter microangiopathy, and remote lacunar infarcts in the bilateral basal ganglia. 3. 1.6 cm hypodense lesion in the soft tissues of the right cheek has been present since at least 2018 and may reflect a sebaceous cyst. Correlate with exam. Electronically Signed   By: Valetta Mole M.D.   On: 12/12/2020 13:10   CARDIAC CATHETERIZATION  Result Date: 12/13/2020   Moderate  to Severe calcification of all epicardial coronary arteries; tortuous arteries.   Mid RCA lesion is 70% stenosed.  (Very discrete lesion, questionable significance.  Range from 30 to 90% depending on image)   Dist Cx lesion is 30% stenosed.   LV end diastolic pressure is normal.   There is no aortic valve stenosis. Post-Operative Diagnoses: Heavily calcified coronary arteries with diffuse mild to moderate calcific stenosis throughout. Potential culprit lesion is a possible focal lesion in the mid RCA questionable significance.   In some images, there appears to be minimal stenosis whereas another images it appears to be severe.  Likely heavily calcified vessel with hazy appearance because of calcification. =  This lesion is in a very tortuous heavily calcified vessel, not able to advance FFR wired halfway to the lesion.  Likely  not very amenable to PCI. Despite having Hypertrophic Cardiomyopathy, the LVEDP was normal at 14 mmHg. ->  LV gram not performed in order to conserve contrast. Recommendations: Aggressive risk factor modification for extensive calcified CAD. If the patient does have active anginal symptoms, could consider noninvasive evaluation of the RCA to determine if this is significant.  Would need to review with interventional colleagues to determine if there is any potential option as the entire vessel is calcified, very tortuous making it highly unlikely to be actually successfully treated.  Would definitely need to be done by the femoral access due to the 270  turn in the innominate artery Glenetta Hew, MD  ECHOCARDIOGRAM COMPLETE  Result Date: 12/13/2020    ECHOCARDIOGRAM REPORT   Patient Name:   Shannon Chen Date of Exam: 12/12/2020 Medical Rec #:  [id number]       Height:       65.0 in Accession #:    [id number]      Weight:       140.0 lb Date of Birth:  [birth year]       BSA:          1.700 m Patient Age:    57 years        BP:           112/60 mmHg Patient Gender: F               HR:           63 bpm. Exam Location:  [facility] Procedure: 2D Echo, Cardiac Doppler and Color Doppler Indications:     I21.4 NSTEMI  History:         Patient has prior history of Echocardiogram examinations, most                  recent 05/19/2020. Hypertrophic Cardiomyopathy, Pacemaker; Risk                  Factors:Hypertension and Dyslipidemia.  Sonographer:     Cresenciano Lick RDCS Referring Phys:  [id number] Kate Sable Diagnosing Phys: Ida Rogue MD IMPRESSIONS  1. Left ventricular ejection fraction, by estimation, is 60 to 65%. The left ventricle has normal function. The left ventricle has no regional wall motion abnormalities. There is severe left ventricular hypertrophy. Left ventricular diastolic parameters  are indeterminate.  2. Right ventricular systolic function is normal. The right ventricular size is normal.  Tricuspid regurgitation signal is inadequate for assessing PA pressure.  3. The mitral valve is normal in structure. Mild mitral valve regurgitation. No evidence of mitral stenosis.  4. The aortic valve is normal in structure. Aortic valve regurgitation is not visualized. Mild to moderate  aortic valve sclerosis/calcification is present, without any evidence of aortic stenosis.  5. The inferior vena cava is normal in size with greater than 50% respiratory variability, suggesting right atrial pressure of 3 mmHg. FINDINGS  Left Ventricle: Left ventricular ejection fraction, by estimation, is 60 to 65%. The left ventricle has normal function. The left ventricle has no regional wall motion abnormalities. The left ventricular internal cavity size was normal in size. There is  severe left ventricular hypertrophy. Left ventricular diastolic parameters are indeterminate. Right Ventricle: The right ventricular size is normal. No increase in right ventricular wall thickness. Right ventricular systolic function is normal. Tricuspid regurgitation signal is inadequate for assessing PA pressure. Left Atrium: Left atrial size was normal in size. Right Atrium: Right atrial size was normal in size. Pericardium: There is no evidence of pericardial effusion. Mitral Valve: The mitral valve is normal in structure. Mild mitral annular calcification. Mild mitral valve regurgitation. No evidence of mitral valve stenosis. Tricuspid Valve: The tricuspid valve is normal in structure. Tricuspid valve regurgitation is not demonstrated. No evidence of tricuspid stenosis. Aortic Valve: The aortic valve is normal in structure. Aortic valve regurgitation is not visualized. Mild to moderate aortic valve sclerosis/calcification is present, without any evidence of aortic stenosis. Aortic valve mean gradient measures 6.0 mmHg. Aortic valve peak gradient measures 12.4 mmHg. Aortic valve area, by VTI measures 2.74 cm. Pulmonic Valve: The pulmonic valve  was normal in structure. Pulmonic valve regurgitation is not visualized. No evidence of pulmonic stenosis. Aorta: The aortic root is normal in size and structure. Venous: The inferior vena cava is normal in size with greater than 50% respiratory variability, suggesting right atrial pressure of 3 mmHg. IAS/Shunts: No atrial level shunt detected by color flow Doppler.  LEFT VENTRICLE PLAX 2D LVIDd:         3.50 cm   Diastology LVIDs:         2.00 cm   LV e' medial:    4.68 cm/s LV PW:         1.40 cm   LV E/e' medial:  13.3 LV IVS:        1.40 cm   LV e' lateral:   4.35 cm/s LVOT diam:     2.00 cm   LV E/e' lateral: 14.3 LV SV:         88 LV SV Index:   52 LVOT Area:     3.14 cm  RIGHT VENTRICLE RV Basal diam:  4.20 cm RV S prime:     9.48 cm/s TAPSE (M-mode): 2.6 cm LEFT ATRIUM             Index        RIGHT ATRIUM           Index LA diam:        3.70 cm 2.18 cm/m   RA Area:     10.10 cm LA Vol (A2C):   42.3 ml 24.88 ml/m  RA Volume:   22.60 ml  13.29 ml/m LA Vol (A4C):   38.2 ml 22.47 ml/m LA Biplane Vol: 42.4 ml 24.94 ml/m  AORTIC VALVE AV Area (Vmax):    2.83 cm AV Area (Vmean):   3.37 cm AV Area (VTI):     2.74 cm AV Vmax:           176.00 cm/s AV Vmean:          116.000 cm/s AV VTI:            0.323  m AV Peak Grad:      12.4 mmHg AV Mean Grad:      6.0 mmHg LVOT Vmax:         158.50 cm/s LVOT Vmean:        124.500 cm/s LVOT VTI:          0.282 m LVOT/AV VTI ratio: 0.87  AORTA Ao Root diam: 3.10 cm Ao Asc diam:  2.90 cm MITRAL VALVE MV Area (PHT): 2.54 cm     SHUNTS MV Decel Time: 299 msec     Systemic VTI:  0.28 m MV E velocity: 62.40 cm/s   Systemic Diam: 2.00 cm MV A velocity: 115.00 cm/s MV E/A ratio:  0.54 Ida Rogue MD Electronically signed by Ida Rogue MD Signature Date/Time: 12/13/2020/7:11:48 PM    Final     Microbiology: Recent Results (from the past 240 hour(s))  Resp Panel by RT-PCR (Flu A&B, Covid) Nasopharyngeal Swab     Status: None   Collection Time: 12/12/20 11:29 AM    Specimen: Nasopharyngeal Swab; Nasopharyngeal(NP) swabs in vial transport medium  Result Value Ref Range Status   SARS Coronavirus 2 by RT PCR NEGATIVE NEGATIVE Final    Comment: (NOTE) SARS-CoV-2 target nucleic acids are NOT DETECTED.  The SARS-CoV-2 RNA is generally detectable in upper respiratory specimens during the acute phase of infection. The lowest concentration of SARS-CoV-2 viral copies this assay can detect is 138 copies/mL. A negative result does not preclude SARS-Cov-2 infection and should not be used as the sole basis for treatment or other patient management decisions. A negative result may occur with  improper specimen collection/handling, submission of specimen other than nasopharyngeal swab, presence of viral mutation(s) within the areas targeted by this assay, and inadequate number of viral copies(<138 copies/mL). A negative result must be combined with clinical observations, patient history, and epidemiological information. The expected result is Negative.  Fact Sheet for Patients:  EntrepreneurPulse.com.au  Fact Sheet for Healthcare Providers:  IncredibleEmployment.be  This test is no t yet approved or cleared by the [place] FDA and  has been authorized for detection and/or diagnosis of SARS-CoV-2 by FDA under an Emergency Use Authorization (EUA). This EUA will remain  in effect (meaning this test can be used) for the duration of the COVID-19 declaration under Section 564(b)(1) of the Act, 21 U.S.C.section 360bbb-3(b)(1), unless the authorization is terminated  or revoked sooner.       Influenza A by PCR NEGATIVE NEGATIVE Final   Influenza B by PCR NEGATIVE NEGATIVE Final    Comment: (NOTE) The Xpert Xpress SARS-CoV-2/FLU/RSV plus assay is intended as an aid in the diagnosis of influenza from Nasopharyngeal swab specimens and should not be used as a sole basis for treatment. Nasal washings and aspirates are  unacceptable for Xpert Xpress SARS-CoV-2/FLU/RSV testing.  Fact Sheet for Patients: EntrepreneurPulse.com.au  Fact Sheet for Healthcare Providers: IncredibleEmployment.be  This test is not yet approved or cleared by the [place] FDA and has been authorized for detection and/or diagnosis of SARS-CoV-2 by FDA under an Emergency Use Authorization (EUA). This EUA will remain in effect (meaning this test can be used) for the duration of the COVID-19 declaration under Section 564(b)(1) of the Act, 21 U.S.C. section 360bbb-3(b)(1), unless the authorization is terminated or revoked.  Performed at [facility], [address] [place], [place] [address]      Labs: CBC: Recent Labs  Lab 12/13/20 0536 12/14/20 0521 12/15/20 0529 12/16/20 0541 12/17/20 0446  WBC 3.9* 3.7* 3.9*  3.5* 4.2  HGB 12.1 11.7* 12.2 11.4* 12.0  HCT 38.2 35.7* 37.2 33.6* 37.4  MCV 83.8 84.8 83.6 82.8 82.0  PLT 156 144* 148* 128* [id number]*   Basic Metabolic Panel: Recent Labs  Lab 12/12/20 1206 12/13/20 0536 12/14/20 0521 12/15/20 0529 12/16/20 0541 12/17/20 0446  NA  --  140 140 139 138 139  K  --  4.2 3.8 3.8 3.9 4.1  CL  --  111 108 112* 109 109  CO2  --  26 23 24 22 23   GLUCOSE  --  107* 96 105* 114* 105*  BUN  --  29* 28* 25* 30* 38*  CREATININE  --  1.24* 1.12* 0.99 1.05* 1.04*  CALCIUM  --  9.2 8.7* 9.3 8.9 9.3  MG 2.2  --  1.9 2.0 2.1  --   PHOS  --  4.6 4.2 3.5 4.4  --    Liver Function Tests: No results for input(s): AST, ALT, ALKPHOS, BILITOT, PROT, ALBUMIN in the last 168 hours. No results for input(s): LIPASE, AMYLASE in the last 168 hours. No results for input(s): AMMONIA in the last 168 hours. Cardiac Enzymes: No results for input(s): CKTOTAL, CKMB, CKMBINDEX, TROPONINI in the last 168 hours. BNP (last 3 results) Recent Labs    12/12/20 0828  BNP 12.3   CBG: Recent Labs  Lab 12/16/20 0825 12/16/20 1219 12/16/20 1649  12/16/20 2117 12/17/20 0713  GLUCAP 101* 101* 115* 125* 119*    Time spent: 35 minutes  Signed:  Val Riles  [facility]  12/17/2020 9:14 AM

## 2020-12-17 NOTE — TOC Transition Note (Signed)
Transition of Care [facility]) - CM/SW Discharge Note   Patient Details  Name: Shannon Chen MRN: [id number] Date of Birth: 11/15/1945  [facility] CM/SW Contact:  Gildardo Griffes, LCSW Phone Number: 12/17/2020, 9:19 AM   Clinical Narrative:     Patient to discharge back to group home, spoke with [facility] at [phone] who reports they are able to pick patient up at 9:30 am, as patient was medically ready 10/30 but group home was unable to pick up until today.   MD and RN notified.     Final next level of care: Group Home Barriers to Discharge: No Barriers Identified   Patient Goals and CMS Choice Patient states their goals for this hospitalization and ongoing recovery are:: to go home CMS Medicare.gov Compare Post Acute Care list provided to:: Patient Choice offered to / list presented to : Patient  Discharge Placement                  Name of family member notified: group home Patient and family notified of of transfer: 12/17/20  Discharge Plan and Services                                     Social Determinants of Health (SDOH) Interventions     Readmission Risk Interventions Readmission Risk Prevention Plan 12/15/2020  Transportation Screening Complete  PCP or Specialist Appt within 5-7 Days Complete  Home Care Screening Complete  Medication Review (RN CM) Complete  Some recent data might be hidden

## 2020-12-21 ENCOUNTER — Other Ambulatory Visit: Payer: Self-pay

## 2020-12-21 ENCOUNTER — Encounter: Payer: Medicare HMO | Attending: Internal Medicine | Admitting: *Deleted

## 2020-12-21 DIAGNOSIS — Z5189 Encounter for other specified aftercare: Secondary | ICD-10-CM | POA: Insufficient documentation

## 2020-12-21 DIAGNOSIS — I214 Non-ST elevation (NSTEMI) myocardial infarction: Secondary | ICD-10-CM

## 2020-12-21 DIAGNOSIS — I4510 Unspecified right bundle-branch block: Secondary | ICD-10-CM | POA: Insufficient documentation

## 2020-12-21 DIAGNOSIS — R9431 Abnormal electrocardiogram [ECG] [EKG]: Secondary | ICD-10-CM | POA: Insufficient documentation

## 2020-12-21 DIAGNOSIS — I252 Old myocardial infarction: Secondary | ICD-10-CM | POA: Insufficient documentation

## 2020-12-21 NOTE — Progress Notes (Signed)
Initial telephone orientation completed. Diagnosis can be found in [facility] 10/26. EP orientation scheduled for Monday 11/21 at 9am.

## 2020-12-28 ENCOUNTER — Other Ambulatory Visit: Payer: Self-pay

## 2020-12-28 ENCOUNTER — Emergency Department: Payer: Medicare HMO

## 2020-12-28 ENCOUNTER — Inpatient Hospital Stay
Admission: EM | Admit: 2020-12-28 | Discharge: 2021-01-04 | DRG: 280 | Disposition: A | Discharge status: Home Health | Payer: Medicare HMO | Attending: Internal Medicine | Admitting: Internal Medicine

## 2020-12-28 ENCOUNTER — Encounter: Payer: Self-pay | Admitting: Radiology

## 2020-12-28 DIAGNOSIS — E785 Hyperlipidemia, unspecified: Secondary | ICD-10-CM | POA: Diagnosis present

## 2020-12-28 DIAGNOSIS — I422 Other hypertrophic cardiomyopathy: Secondary | ICD-10-CM | POA: Diagnosis present

## 2020-12-28 DIAGNOSIS — Z7902 Long term (current) use of antithrombotics/antiplatelets: Secondary | ICD-10-CM

## 2020-12-28 DIAGNOSIS — E872 Acidosis, unspecified: Secondary | ICD-10-CM | POA: Diagnosis present

## 2020-12-28 DIAGNOSIS — M25561 Pain in right knee: Secondary | ICD-10-CM | POA: Diagnosis present

## 2020-12-28 DIAGNOSIS — N179 Acute kidney failure, unspecified: Secondary | ICD-10-CM

## 2020-12-28 DIAGNOSIS — Z833 Family history of diabetes mellitus: Secondary | ICD-10-CM

## 2020-12-28 DIAGNOSIS — I959 Hypotension, unspecified: Secondary | ICD-10-CM | POA: Diagnosis present

## 2020-12-28 DIAGNOSIS — R7402 Elevation of levels of lactic acid dehydrogenase (LDH): Secondary | ICD-10-CM | POA: Diagnosis not present

## 2020-12-28 DIAGNOSIS — E1169 Type 2 diabetes mellitus with other specified complication: Secondary | ICD-10-CM | POA: Diagnosis present

## 2020-12-28 DIAGNOSIS — N1831 Chronic kidney disease, stage 3a: Secondary | ICD-10-CM | POA: Diagnosis present

## 2020-12-28 DIAGNOSIS — K828 Other specified diseases of gallbladder: Secondary | ICD-10-CM | POA: Diagnosis present

## 2020-12-28 DIAGNOSIS — R6510 Systemic inflammatory response syndrome (SIRS) of non-infectious origin without acute organ dysfunction: Secondary | ICD-10-CM | POA: Diagnosis present

## 2020-12-28 DIAGNOSIS — I5033 Acute on chronic diastolic (congestive) heart failure: Secondary | ICD-10-CM | POA: Diagnosis not present

## 2020-12-28 DIAGNOSIS — I442 Atrioventricular block, complete: Secondary | ICD-10-CM | POA: Diagnosis present

## 2020-12-28 DIAGNOSIS — I723 Aneurysm of iliac artery: Secondary | ICD-10-CM | POA: Diagnosis present

## 2020-12-28 DIAGNOSIS — Z8679 Personal history of other diseases of the circulatory system: Secondary | ICD-10-CM

## 2020-12-28 DIAGNOSIS — D649 Anemia, unspecified: Secondary | ICD-10-CM | POA: Diagnosis present

## 2020-12-28 DIAGNOSIS — I4891 Unspecified atrial fibrillation: Secondary | ICD-10-CM | POA: Diagnosis not present

## 2020-12-28 DIAGNOSIS — R778 Other specified abnormalities of plasma proteins: Secondary | ICD-10-CM | POA: Diagnosis not present

## 2020-12-28 DIAGNOSIS — I2510 Atherosclerotic heart disease of native coronary artery without angina pectoris: Secondary | ICD-10-CM | POA: Diagnosis present

## 2020-12-28 DIAGNOSIS — Z79899 Other long term (current) drug therapy: Secondary | ICD-10-CM

## 2020-12-28 DIAGNOSIS — Z95818 Presence of other cardiac implants and grafts: Secondary | ICD-10-CM | POA: Diagnosis not present

## 2020-12-28 DIAGNOSIS — J449 Chronic obstructive pulmonary disease, unspecified: Secondary | ICD-10-CM | POA: Diagnosis present

## 2020-12-28 DIAGNOSIS — I491 Atrial premature depolarization: Secondary | ICD-10-CM | POA: Diagnosis present

## 2020-12-28 DIAGNOSIS — Z950 Presence of cardiac pacemaker: Secondary | ICD-10-CM

## 2020-12-28 DIAGNOSIS — K838 Other specified diseases of biliary tract: Secondary | ICD-10-CM | POA: Diagnosis present

## 2020-12-28 DIAGNOSIS — I130 Hypertensive heart and chronic kidney disease with heart failure and stage 1 through stage 4 chronic kidney disease, or unspecified chronic kidney disease: Secondary | ICD-10-CM | POA: Diagnosis present

## 2020-12-28 DIAGNOSIS — E7800 Pure hypercholesterolemia, unspecified: Secondary | ICD-10-CM | POA: Diagnosis not present

## 2020-12-28 DIAGNOSIS — Z87891 Personal history of nicotine dependence: Secondary | ICD-10-CM

## 2020-12-28 DIAGNOSIS — Z20822 Contact with and (suspected) exposure to covid-19: Secondary | ICD-10-CM | POA: Diagnosis present

## 2020-12-28 DIAGNOSIS — E278 Other specified disorders of adrenal gland: Secondary | ICD-10-CM | POA: Diagnosis not present

## 2020-12-28 DIAGNOSIS — Z8249 Family history of ischemic heart disease and other diseases of the circulatory system: Secondary | ICD-10-CM

## 2020-12-28 DIAGNOSIS — N189 Chronic kidney disease, unspecified: Secondary | ICD-10-CM | POA: Diagnosis not present

## 2020-12-28 DIAGNOSIS — Z7982 Long term (current) use of aspirin: Secondary | ICD-10-CM

## 2020-12-28 DIAGNOSIS — I214 Non-ST elevation (NSTEMI) myocardial infarction: Secondary | ICD-10-CM | POA: Diagnosis present

## 2020-12-28 DIAGNOSIS — I700 Atherosclerosis of aorta: Secondary | ICD-10-CM | POA: Diagnosis present

## 2020-12-28 DIAGNOSIS — I421 Obstructive hypertrophic cardiomyopathy: Secondary | ICD-10-CM | POA: Diagnosis not present

## 2020-12-28 DIAGNOSIS — I429 Cardiomyopathy, unspecified: Secondary | ICD-10-CM | POA: Diagnosis not present

## 2020-12-28 DIAGNOSIS — E1122 Type 2 diabetes mellitus with diabetic chronic kidney disease: Secondary | ICD-10-CM | POA: Diagnosis present

## 2020-12-28 DIAGNOSIS — I252 Old myocardial infarction: Secondary | ICD-10-CM | POA: Diagnosis not present

## 2020-12-28 DIAGNOSIS — I441 Atrioventricular block, second degree: Secondary | ICD-10-CM | POA: Diagnosis not present

## 2020-12-28 DIAGNOSIS — I951 Orthostatic hypotension: Secondary | ICD-10-CM | POA: Diagnosis not present

## 2020-12-28 DIAGNOSIS — N182 Chronic kidney disease, stage 2 (mild): Secondary | ICD-10-CM | POA: Diagnosis not present

## 2020-12-28 DIAGNOSIS — M25562 Pain in left knee: Secondary | ICD-10-CM | POA: Diagnosis present

## 2020-12-28 DIAGNOSIS — E860 Dehydration: Secondary | ICD-10-CM | POA: Diagnosis present

## 2020-12-28 DIAGNOSIS — R531 Weakness: Secondary | ICD-10-CM | POA: Diagnosis not present

## 2020-12-28 DIAGNOSIS — I498 Other specified cardiac arrhythmias: Secondary | ICD-10-CM | POA: Diagnosis not present

## 2020-12-28 DIAGNOSIS — A419 Sepsis, unspecified organism: Secondary | ICD-10-CM

## 2020-12-28 HISTORY — DX: Atherosclerotic heart disease of native coronary artery without angina pectoris: I25.10

## 2020-12-28 LAB — CBC WITH DIFFERENTIAL/PLATELET
Abs Immature Granulocytes: 0.01 10*3/uL (ref 0.00–0.07)
Basophils Absolute: 0 10*3/uL (ref 0.0–0.1)
Basophils Relative: 1 %
Eosinophils Absolute: 0.1 10*3/uL (ref 0.0–0.5)
Eosinophils Relative: 2 %
HCT: 37 % (ref 36.0–46.0)
Hemoglobin: 11.7 g/dL — ABNORMAL LOW (ref 12.0–15.0)
Immature Granulocytes: 0 %
Lymphocytes Relative: 34 %
Lymphs Abs: 1.6 10*3/uL (ref 0.7–4.0)
MCH: 27.1 pg (ref 26.0–34.0)
MCHC: 31.6 g/dL (ref 30.0–36.0)
MCV: 85.8 fL (ref 80.0–100.0)
Monocytes Absolute: 0.4 10*3/uL (ref 0.1–1.0)
Monocytes Relative: 8 %
Neutro Abs: 2.6 10*3/uL (ref 1.7–7.7)
Neutrophils Relative %: 55 %
Platelets: 201 10*3/uL (ref 150–400)
RBC: 4.31 MIL/uL (ref 3.87–5.11)
RDW: 14.6 % (ref 11.5–15.5)
WBC: 4.7 10*3/uL (ref 4.0–10.5)
nRBC: 0 % (ref 0.0–0.2)

## 2020-12-28 LAB — PROTIME-INR
INR: 1 (ref 0.8–1.2)
Prothrombin Time: 13.4 seconds (ref 11.4–15.2)

## 2020-12-28 LAB — RESP PANEL BY RT-PCR (FLU A&B, COVID) ARPGX2
Influenza A by PCR: NEGATIVE
Influenza B by PCR: NEGATIVE
SARS Coronavirus 2 by RT PCR: NEGATIVE

## 2020-12-28 LAB — APTT: aPTT: 31 seconds (ref 24–36)

## 2020-12-28 LAB — TROPONIN I (HIGH SENSITIVITY): Troponin I (High Sensitivity): 105 ng/L (ref <18)

## 2020-12-28 LAB — URINALYSIS, COMPLETE (UACMP) WITH MICROSCOPIC
Bilirubin Urine: NEGATIVE
Glucose, UA: >=500 mg/dL — AB
Ketones, ur: NEGATIVE mg/dL
Leukocytes,Ua: NEGATIVE
Nitrite: NEGATIVE
Protein, ur: 30 mg/dL — AB
Specific Gravity, Urine: 1.009 (ref 1.005–1.030)
pH: 5 (ref 5.0–8.0)

## 2020-12-28 LAB — COMPREHENSIVE METABOLIC PANEL
ALT: 40 U/L (ref 0–44)
AST: 31 U/L (ref 15–41)
Albumin: 3.6 g/dL (ref 3.5–5.0)
Alkaline Phosphatase: 56 U/L (ref 38–126)
Anion gap: 8 (ref 5–15)
BUN: 35 mg/dL — ABNORMAL HIGH (ref 8–23)
CO2: 23 mmol/L (ref 22–32)
Calcium: 9.3 mg/dL (ref 8.9–10.3)
Chloride: 108 mmol/L (ref 98–111)
Creatinine, Ser: 1.23 mg/dL — ABNORMAL HIGH (ref 0.44–1.00)
GFR, Estimated: 45 mL/min — ABNORMAL LOW (ref >60)
Glucose, Bld: 185 mg/dL — ABNORMAL HIGH (ref 70–99)
Potassium: 3.5 mmol/L (ref 3.5–5.1)
Sodium: 139 mmol/L (ref 135–145)
Total Bilirubin: 1.2 mg/dL (ref 0.3–1.2)
Total Protein: 7.3 g/dL (ref 6.5–8.1)

## 2020-12-28 LAB — LACTIC ACID, PLASMA
Lactic Acid, Venous: 3.6 mmol/L (ref 0.5–1.9)
Lactic Acid, Venous: 3.9 mmol/L (ref 0.5–1.9)
Lactic Acid, Venous: 1.6 mmol/L (ref 0.5–1.9)

## 2020-12-28 MED ORDER — ONDANSETRON HCL 4 MG/2ML IJ SOLN: 4.0000 mg | Freq: Once | INTRAMUSCULAR | Status: AC

## 2020-12-28 MED ORDER — SODIUM CHLORIDE 0.9 % IV SOLN
2.0000 g | INTRAVENOUS | Status: DC
  Administered 2020-12-29: 2 g via INTRAVENOUS
  Filled 2020-12-28: qty 20

## 2020-12-28 MED ORDER — SODIUM CHLORIDE 0.9 % IV SOLN
2.0000 g | INTRAVENOUS | Status: DC
  Administered 2020-12-28: 2 g via INTRAVENOUS
  Filled 2020-12-28: qty 20

## 2020-12-28 MED ORDER — ALBUTEROL SULFATE (2.5 MG/3ML) 0.083% IN NEBU: 2.5000 mg | INHALATION_SOLUTION | Freq: Four times a day (QID) | RESPIRATORY_TRACT | Status: DC | PRN

## 2020-12-28 MED ORDER — ALBUTEROL SULFATE HFA 108 (90 BASE) MCG/ACT IN AERS: 1.0000 | INHALATION_SPRAY | Freq: Four times a day (QID) | RESPIRATORY_TRACT | Status: DC | PRN

## 2020-12-28 MED ORDER — MIRTAZAPINE 15 MG PO TABS
7.5000 mg | ORAL_TABLET | Freq: Every day | ORAL | Status: DC
  Administered 2020-12-28 – 2021-01-03 (×7): 7.5 mg via ORAL
  Filled 2020-12-28 (×7): qty 1

## 2020-12-28 MED ORDER — ACETAMINOPHEN 325 MG PO TABS
650.0000 mg | ORAL_TABLET | ORAL | Status: DC | PRN
  Administered 2020-12-29 – 2021-01-01 (×4): 650 mg via ORAL
  Filled 2020-12-28 (×6): qty 2

## 2020-12-28 MED ORDER — FLUTICASONE-UMECLIDIN-VILANT 200-62.5-25 MCG/ACT IN AEPB: 1.0000 | INHALATION_SPRAY | Freq: Every day | RESPIRATORY_TRACT | Status: DC

## 2020-12-28 MED ORDER — AZITHROMYCIN 250 MG PO TABS
250.0000 mg | ORAL_TABLET | Freq: Every day | ORAL | Status: DC
  Administered 2020-12-29: 250 mg via ORAL
  Filled 2020-12-28: qty 1

## 2020-12-28 MED ORDER — FLUTICASONE FUROATE-VILANTEROL 200-25 MCG/ACT IN AEPB
1.0000 | INHALATION_SPRAY | Freq: Every day | RESPIRATORY_TRACT | Status: DC
  Administered 2020-12-29 – 2021-01-04 (×7): 1 via RESPIRATORY_TRACT
  Filled 2020-12-28: qty 28

## 2020-12-28 MED ORDER — POLYETHYLENE GLYCOL 3350 17 G PO PACK: 17.0000 g | PACK | Freq: Every day | ORAL | Status: DC | PRN

## 2020-12-28 MED ORDER — LACTATED RINGERS IV BOLUS (SEPSIS)
1000.0000 mL | Freq: Once | INTRAVENOUS | Status: AC
  Administered 2020-12-28: 1000 mL via INTRAVENOUS

## 2020-12-28 MED ORDER — FINERENONE 20 MG PO TABS: 20.0000 mg | ORAL_TABLET | Freq: Every day | ORAL | Status: DC

## 2020-12-28 MED ORDER — SODIUM CHLORIDE 0.9 % IV SOLN
1.0000 g | Freq: Once | INTRAVENOUS | Status: AC
  Administered 2020-12-28: 1 g via INTRAVENOUS
  Filled 2020-12-28: qty 10

## 2020-12-28 MED ORDER — ASPIRIN EC 81 MG PO TBEC
81.0000 mg | DELAYED_RELEASE_TABLET | Freq: Every day | ORAL | Status: DC
  Administered 2020-12-28 – 2021-01-04 (×8): 81 mg via ORAL
  Filled 2020-12-28 (×8): qty 1

## 2020-12-28 MED ORDER — IOHEXOL 300 MG/ML  SOLN
80.0000 mL | Freq: Once | INTRAMUSCULAR | Status: AC | PRN
  Administered 2020-12-28: 80 mL via INTRAVENOUS

## 2020-12-28 MED ORDER — UMECLIDINIUM BROMIDE 62.5 MCG/ACT IN AEPB
1.0000 | INHALATION_SPRAY | Freq: Every day | RESPIRATORY_TRACT | Status: DC
  Administered 2020-12-29 – 2021-01-04 (×7): 1 via RESPIRATORY_TRACT
  Filled 2020-12-28: qty 7

## 2020-12-28 MED ORDER — METOPROLOL SUCCINATE ER 50 MG PO TB24
50.0000 mg | ORAL_TABLET | Freq: Every day | ORAL | Status: DC
  Administered 2020-12-29: 50 mg via ORAL
  Filled 2020-12-28: qty 1

## 2020-12-28 MED ORDER — DAPAGLIFLOZIN PROPANEDIOL 10 MG PO TABS
10.0000 mg | ORAL_TABLET | Freq: Every day | ORAL | Status: DC
  Administered 2020-12-29 – 2021-01-04 (×7): 10 mg via ORAL
  Filled 2020-12-28 (×7): qty 1

## 2020-12-28 MED ORDER — AZITHROMYCIN 250 MG PO TABS
500.0000 mg | ORAL_TABLET | Freq: Every day | ORAL | Status: AC
  Administered 2020-12-28: 500 mg via ORAL
  Filled 2020-12-28: qty 2

## 2020-12-28 MED ORDER — LACTATED RINGERS IV SOLN: INTRAVENOUS | Status: DC

## 2020-12-28 MED ORDER — VIBEGRON 75 MG PO TABS: 75.0000 mg | ORAL_TABLET | Freq: Every day | ORAL | Status: DC

## 2020-12-28 MED ORDER — CLOPIDOGREL BISULFATE 75 MG PO TABS
75.0000 mg | ORAL_TABLET | Freq: Every day | ORAL | Status: DC
  Administered 2020-12-29 – 2021-01-04 (×7): 75 mg via ORAL
  Filled 2020-12-28 (×7): qty 1

## 2020-12-28 MED ORDER — ENOXAPARIN SODIUM 40 MG/0.4ML IJ SOSY
40.0000 mg | PREFILLED_SYRINGE | INTRAMUSCULAR | Status: DC
  Administered 2020-12-28: 40 mg via SUBCUTANEOUS
  Filled 2020-12-28: qty 0.4

## 2020-12-28 MED ORDER — ROSUVASTATIN CALCIUM 10 MG PO TABS
40.0000 mg | ORAL_TABLET | Freq: Every day | ORAL | Status: DC
  Administered 2020-12-29 – 2021-01-04 (×7): 40 mg via ORAL
  Filled 2020-12-28 (×5): qty 4
  Filled 2020-12-28: qty 2
  Filled 2020-12-28 (×2): qty 4

## 2020-12-28 MED ORDER — ONDANSETRON HCL 4 MG/2ML IJ SOLN
INTRAMUSCULAR | Status: AC
  Administered 2020-12-28: 4 mg via INTRAVENOUS
  Filled 2020-12-28: qty 2

## 2020-12-28 NOTE — ED Notes (Signed)
Lab at bedside to collect blood work 

## 2020-12-28 NOTE — Sepsis Progress Note (Signed)
Code sepsis protocol being monitored by eLink. 

## 2020-12-28 NOTE — ED Notes (Signed)
Critical repeat lactic acid Lactic Acid 3.9 Dr. Vicente Males notified.

## 2020-12-28 NOTE — ED Notes (Signed)
Urine obtained from suction container and sent to lab

## 2020-12-28 NOTE — H&P (Signed)
[facility] [place] NAME: Shannon Chen    MR#:  [id number]  DATE OF BIRTH:  1945-06-19  DATE OF ADMISSION:  12/28/2020  PRIMARY CARE PHYSICIAN: Remi Haggard, FNP   REQUESTING/REFERRING PHYSICIAN: Dr Archie Balboa  CHIEF COMPLAINT:   Chief Complaint  Patient presents with  . Altered Mental Status    HISTORY OF PRESENT ILLNESS:  Shannon Chen  is a 75 y.o. female sent in with altered mental status and found to be hypotensive in the emergency room.  Suspected sepsis protocol given with IV fluids.  Patient's blood pressures responded.  Mental status has improved and able to answer questions.  She states that she has been feeling very weak and her body felt heavy.  She felt kind of bad.  She has been taking her medications.  No complaints of chest pain or shortness of breath.  She did complain of some sweating and some runny nose.  In the ER she was found to be hypotensive with a blood pressure of 84/54, elevation in creatinine up to 1.23 and lactic acidosis of 3.9.  She was empirically started antibiotics secondary to suspected sepsis.  Urine analysis negative and CT scan of the chest negative for pneumonia.  PAST MEDICAL HISTORY:   Past Medical History:  Diagnosis Date  . Arthritis   . AV block, Mobitz II    a. 09/2016 s/p MDT [id number] Azure XT DR MRI DC PPM  . Carotid artery disease ([place])    a. 09/2016 Carotid U/S: RICA [id number], LICA [id number];  b. [id number] CTA Neck: RICA 60, RCCA 30, LICA [id number], L Vert 60, L Basilar 70; c. 10/2019 s/p R CEA.  . Essential hypertension   . History of stress test    a. 12/2016 MV: EF 51%, no ischemia/infarct; b. 10/2019 MV: EF 61%, no ischemia/infarct.  . Hyperlipidemia   . Hypertrophic cardiomyopathy ([place])    a. 09/2016 Echo: EF 60-65%, no rwma, Gr1 DD, Ca2+ MV annulus; b. 11/2017 Echo: EF 70-75%, no rwma, Gr1 DD, mild AS vs dynamic LVOT obs. Nl RV fxn. Hyperdynamic LVEF w/ sev LVH; c. 10/2019 Echo: EF 70-75%, no rwma,  gr1 DD, sev LVH, Gr1 DD, nl PASP, triv MR, mild-mod Ao sclerosis; c. 05/2020 Echo: EF 60-65%, no rwma, sev conc LVH, gr1 DD, nl RV fxn. RVSP 21.45mmHg. Mildly dil LA.  . Presence of permanent cardiac pacemaker     PAST SURGICAL HISTORY:   Past Surgical History:  Procedure Laterality Date  . ABDOMINAL HYSTERECTOMY    . ENDARTERECTOMY Right 11/16/2019   Procedure: ENDARTERECTOMY CAROTID;  Surgeon: Katha Cabal, MD;  Location: [facility];  Service: Vascular;  Laterality: Right;  . INTRAVASCULAR PRESSURE WIRE/FFR STUDY N/A 12/13/2020   Procedure: INTRAVASCULAR PRESSURE WIRE/FFR STUDY;  Surgeon: Leonie Man, MD;  Location: [facility];  Service: Cardiovascular;  Laterality: N/A;  . LEFT HEART CATH AND CORONARY ANGIOGRAPHY N/A 12/13/2020   Procedure: LEFT HEART CATH AND CORONARY ANGIOGRAPHY;  Surgeon: Leonie Man, MD;  Location: [facility];  Service: Cardiovascular;  Laterality: N/A;  . PACEMAKER IMPLANT N/A 10/08/2016   Procedure: Pacemaker Implant;  Surgeon: Evans Lance, MD;  Location: [facility];  Service: Cardiovascular;  Laterality: N/A;    SOCIAL HISTORY:   Social History   Tobacco Use  . Smoking status: Former    Packs/day: 1.00    Years: 30.00    Pack years: 30.00    Types: Cigarettes  Quit date: 11/09/2018    Years since quitting: 2.1  . Smokeless tobacco: Never  Substance Use Topics  . Alcohol use: No    FAMILY HISTORY:   Family History  Problem Relation Age of Onset  . Diabetes Mother   . CAD Father     DRUG ALLERGIES:  No Known Allergies  REVIEW OF SYSTEMS:  CONSTITUTIONAL: No fever, positive for sweating.  Positive for fatigue and weakness.  EYES: No blurred or double vision.  EARS, NOSE, AND THROAT: No tinnitus or ear pain.  Positive for runny nose.  No sore throat RESPIRATORY: No cough, shortness of breath, wheezing or hemoptysis.  CARDIOVASCULAR: No chest pain.  GASTROINTESTINAL: No nausea.positive for vomiting,.   No diarrhea or abdominal pain. No blood in bowel movements GENITOURINARY: No dysuria, hematuria.  ENDOCRINE: No polyuria, nocturia,  HEMATOLOGY: No anemia, easy bruising or bleeding SKIN: No rash or lesion. MUSCULOSKELETAL: Positive for arthritis.   NEUROLOGIC: No tingling, numbness, weakness.  PSYCHIATRY: No anxiety or depression.   MEDICATIONS AT HOME:   Prior to Admission medications   Medication Sig Start Date End Date Taking? Authorizing Provider  acetaminophen (TYLENOL) 325 MG tablet Take 650 mg by mouth every 4 (four) hours as needed.    [provider]  albuterol (VENTOLIN HFA) 108 (90 Base) MCG/ACT inhaler Inhale 1-2 puffs into the lungs every 6 (six) hours as needed for wheezing or shortness of breath. 11/13/20   [provider]  aspirin EC 81 MG EC tablet Take 1 tablet (81 mg total) by mouth daily. Swallow whole. 11/09/19   Wouk, Ailene Rud, MD  clopidogrel (PLAVIX) 75 MG tablet Take 1 tablet (75 mg total) by mouth daily. 12/17/20 03/17/21  Val Riles, MD  dapagliflozin propanediol (FARXIGA) 10 MG TABS tablet Take 10 mg by mouth daily.    [provider]  Finerenone (KERENDIA) 20 MG TABS Take 20 mg by mouth daily.    [provider]  loperamide (IMODIUM) 2 MG capsule Take 2 mg by mouth as needed. 12/07/19   [provider]  metoprolol succinate (TOPROL-XL) 50 MG 24 hr tablet Take 1 tablet (50 mg total) by mouth daily. 12/16/20 03/16/21  Val Riles, MD  mirtazapine (REMERON) 7.5 MG tablet Take 7.5 mg by mouth at bedtime. 12/04/20   [provider]  polyethylene glycol (MIRALAX) 17 g packet Take 17 g by mouth daily as needed for mild constipation. 11/28/19   Loel Dubonnet, NP  potassium chloride (KLOR-CON) 10 MEQ tablet Take 10 mEq by mouth 2 (two) times daily.    [provider]  rosuvastatin (CRESTOR) 40 MG tablet TAKE 1 TABLET BY MOUTH DAILY 12/04/20   Minna Merritts, MD  traMADol (ULTRAM) 50 MG tablet  Take 50 mg by mouth every 6 (six) hours as needed. 04/23/20   [provider]  Donnal Debar 200-62.5-25 MCG/ACT AEPB Inhale 1 puff into the lungs daily. 12/07/20   [provider]  Vibegron (GEMTESA) 75 MG TABS Take 75 mg by mouth daily.    [provider]      VITAL SIGNS:  Blood pressure (!) 144/80, pulse 78, temperature 98.3 F (36.8 C), temperature source Oral, resp. rate 11, height 5\' 5"  (1.651 m), weight 63.5 kg, SpO2 96 %.  PHYSICAL EXAMINATION:  GENERAL:  75 y.o.-year-old patient lying in the bed with no acute distress.  EYES: Pupils equal, round, reactive to light and accommodation. No scleral icterus. Extraocular muscles intact.  HEENT: Head atraumatic, normocephalic. Oropharynx and nasopharynx  clear.  NECK:  Supple, no jugular venous distention. No thyroid enlargement, no tenderness.  LUNGS: Normal breath sounds bilaterally, no wheezing, rales,rhonchi or crepitation. No use of accessory muscles of respiration.  CARDIOVASCULAR: S1, S2 normal. No murmurs, rubs, or gallops.  ABDOMEN: Soft, nontender, nondistended. Bowel sounds present. No organomegaly or mass.  EXTREMITIES: No pedal edema, cyanosis, or clubbing.  NEUROLOGIC: Cranial nerves II through XII are intact. Muscle strength 5/5 in all extremities. Sensation intact. Gait not checked.  PSYCHIATRIC: The patient is alert and answers questions appropriately.  SKIN: No rash, lesion, or ulcer.   LABORATORY PANEL:   CBC Recent Labs  Lab 12/28/20 0958  WBC 4.7  HGB 11.7*  HCT 37.0  PLT 201   ------------------------------------------------------------------------------------------------------------------  Chemistries  Recent Labs  Lab 12/28/20 0958  NA 139  K 3.5  CL 108  CO2 23  GLUCOSE 185*  BUN 35*  CREATININE 1.23*  CALCIUM 9.3  AST 31  ALT 40  ALKPHOS 56  BILITOT 1.2    ------------------------------------------------------------------------------------------------------------------  Cardiac Enzymes Not done in ER but added on by ER physician  RADIOLOGY:  CT CHEST ABDOMEN PELVIS W CONTRAST  Result Date: 12/28/2020 CLINICAL DATA:  Sepsis, left flank pain. EXAM: CT CHEST, ABDOMEN, AND PELVIS WITH CONTRAST TECHNIQUE: Multidetector CT imaging of the chest, abdomen and pelvis was performed following the standard protocol during bolus administration of intravenous contrast. CONTRAST:  9mL OMNIPAQUE IOHEXOL 300 MG/ML  SOLN COMPARISON:  March 14, 2020. FINDINGS: CT CHEST FINDINGS Cardiovascular: Atherosclerosis of thoracic aorta is noted without aneurysm formation. Normal cardiac size. No pericardial effusion. Coronary artery calcifications are noted. Mediastinum/Nodes: No enlarged mediastinal, hilar, or axillary lymph nodes. Thyroid gland, trachea, and esophagus demonstrate no significant findings. Lungs/Pleura: No pneumothorax or pleural effusion is noted. Left lower lobe opacity noted on prior exam is no longer visualized. Minimal emphysematous disease is noted bilaterally. 3 mm nodule is noted laterally in right upper lobe best seen on image number 41 of series 7. Musculoskeletal: No chest wall mass or suspicious bone lesions identified. CT ABDOMEN PELVIS FINDINGS Hepatobiliary: Liver is unremarkable. Moderate gallbladder distention is noted with dilated common bile duct and mild intrahepatic biliary dilatation. No cholelithiasis is noted. Pancreas: No inflammatory changes noted. Stable pancreatic ductal dilatation is noted which is unchanged. Spleen: Normal in size without focal abnormality. Adrenals/Urinary Tract: Adrenal glands are unremarkable. Kidneys are normal, without renal calculi, focal lesion, or hydronephrosis. Bladder is unremarkable. Stomach/Bowel: Stomach is within normal limits. Appendix appears normal. No evidence of bowel wall thickening, distention,  or inflammatory changes. Vascular/Lymphatic: Aortic atherosclerosis. No enlarged abdominal or pelvic lymph nodes. 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 2.4 cm left common iliac artery aneurysm is noted. 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 Reproductive: Status post hysterectomy. No adnexal masses. Other: No abdominal wall hernia or abnormality. No abdominopelvic ascites. Musculoskeletal: No acute or significant osseous findings. IMPRESSION: 3 mm nodule seen in right upper lobe. No follow-up needed if patient is low-risk. Non-contrast chest CT can be considered in 12 months if patient is high-risk. This recommendation follows the consensus statement: Guidelines for Management of Incidental Pulmonary Nodules Detected on CT Images: From the Fleischner Society 2017; Radiology 2017; 284:228-243. No cholelithiasis is noted. Moderate gallbladder distention is noted with dilated common bile duct and mild intrahepatic biliary dilatation. Correlation with liver function tests is recommended to rule out biliary obstruction. MRCP may be performed for further evaluation. 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 Aortic Atherosclerosis (ICD10-I70.0). Electronically Signed   By: Marijo Conception M.D.   On: 12/28/2020 15:59   DG Chest Port 1 View  Result Date: 12/28/2020 CLINICAL DATA:  Chest pain. EXAM: PORTABLE CHEST 1 VIEW COMPARISON:  December 12, 2020. FINDINGS: Stable cardiomediastinal silhouette. Left-sided pacemaker is unchanged in position. Both lungs are clear. The visualized skeletal structures are unremarkable. IMPRESSION: No active disease. Aortic Atherosclerosis (ICD10-I70.0). Electronically Signed   By: Marijo Conception M.D.   On: 12/28/2020 09:51    EKG:   Initial EKG look like A. fib but on the monitor when I saw her looks like normal sinus rhythm  IMPRESSION AND PLAN:   1.  SIRS.  Suspected sepsis but I do not have a source.   Had hypotension and tachycardia and tachypnea.  Lactic acidosis.  Follow-up blood cultures and urine cultures.  Empiric Rocephin and Zithromax for right now.  Urinalysis and CT scan of the chest were negative. 2.  Hypotension improved with IV fluid hydration.  Hold Toprol today and restart tomorrow.  Check orthostatic vital signs. 3.  Recent NSTEMI.  Troponin 105 and down from previous.  Continue aspirin Plavix and metoprolol tomorrow. 4.  Brief A. fib IV fluid hydration.  Continue metoprolol. 5.  COPD on Trelegy inhaler 6.  Type 2 diabetes mellitus with hyperlipidemia.  Continue Crestor.  Hemoglobin A1c 6.7.  Diet controlled. 7.  Dehydration with creatinine of 1.23 today.  Chronic kidney disease stage IIIa.  IV fluid hydration. 8.  Weakness.  Physical therapy evaluation 9.  History of Mobitz type II AV block with pacemaker 10.  History of hypertrophic cardiomyopathy   All the records, laboratory and radiological data are reviewed and case discussed with ED provider. Management plans discussed with the patient, and she is in agreement.  Patient meets inpatient criteria with suspected sepsis and hypotension.  CODE STATUS: Full code  TOTAL TIME TAKING CARE OF THIS PATIENT: 50 minutes.    Loletha Grayer M.D on 12/28/2020 at 5:19 PM  Between 7am to 6pm - Pager - [phone]  After 6pm call admission pager [phone]  [facility]  CC: Primary care physician; Remi Haggard, FNP

## 2020-12-28 NOTE — ED Triage Notes (Addendum)
Pt to ED via [facility] from [facility]. Facility stating pt started complaining of CP and feeling faint. Pt with hx MI few weeks ago. Facility also states pt more altered than normal. Unknown pt's baseline. Initial BP 77/44. CBG 223. Pt has pacemaker in place. Pt c/o left flank pain

## 2020-12-28 NOTE — ED Notes (Signed)
Pt. Repositioned for comfort, warm blankets and pillows provided. Pt. Denies any further need at this time, NAD.

## 2020-12-28 NOTE — Sepsis Progress Note (Signed)
Notified provider of need to order repeat lactic acid. ° °
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
## 2020-12-28 NOTE — Progress Notes (Signed)
CODE SEPSIS - PHARMACY COMMUNICATION  **Broad Spectrum Antibiotics should be administered within 1 hour of Sepsis diagnosis**  Time Code Sepsis Called/Page Received: 0802  Antibiotics Ordered: Ceftriaxone  Time of 1st antibiotic administration: 1001   Raiford Noble ,PharmD Clinical Pharmacist  12/28/2020  9:36 AM

## 2020-12-28 NOTE — Sepsis Progress Note (Signed)
Secure chat with bedside RN. States will obtain an order for repeat lactic acid and get the follow up drawn.

## 2020-12-29 ENCOUNTER — Encounter: Payer: Self-pay | Admitting: Internal Medicine

## 2020-12-29 ENCOUNTER — Other Ambulatory Visit: Payer: Self-pay

## 2020-12-29 DIAGNOSIS — N182 Chronic kidney disease, stage 2 (mild): Secondary | ICD-10-CM

## 2020-12-29 DIAGNOSIS — I421 Obstructive hypertrophic cardiomyopathy: Secondary | ICD-10-CM

## 2020-12-29 DIAGNOSIS — I951 Orthostatic hypotension: Secondary | ICD-10-CM | POA: Diagnosis not present

## 2020-12-29 DIAGNOSIS — I2510 Atherosclerotic heart disease of native coronary artery without angina pectoris: Secondary | ICD-10-CM | POA: Diagnosis not present

## 2020-12-29 DIAGNOSIS — N179 Acute kidney failure, unspecified: Secondary | ICD-10-CM | POA: Diagnosis not present

## 2020-12-29 DIAGNOSIS — R778 Other specified abnormalities of plasma proteins: Secondary | ICD-10-CM

## 2020-12-29 DIAGNOSIS — N189 Chronic kidney disease, unspecified: Secondary | ICD-10-CM

## 2020-12-29 DIAGNOSIS — I214 Non-ST elevation (NSTEMI) myocardial infarction: Principal | ICD-10-CM

## 2020-12-29 LAB — CBC
HCT: 32 % — ABNORMAL LOW (ref 36.0–46.0)
Hemoglobin: 10.2 g/dL — ABNORMAL LOW (ref 12.0–15.0)
MCH: 27.3 pg (ref 26.0–34.0)
MCHC: 31.9 g/dL (ref 30.0–36.0)
MCV: 85.6 fL (ref 80.0–100.0)
Platelets: 177 10*3/uL (ref 150–400)
RBC: 3.74 MIL/uL — ABNORMAL LOW (ref 3.87–5.11)
RDW: 14.6 % (ref 11.5–15.5)
WBC: 4.1 10*3/uL (ref 4.0–10.5)
nRBC: 0 % (ref 0.0–0.2)

## 2020-12-29 LAB — HEPARIN LEVEL (UNFRACTIONATED): Heparin Unfractionated: 0.82 IU/mL — ABNORMAL HIGH (ref 0.30–0.70)

## 2020-12-29 LAB — BASIC METABOLIC PANEL
Anion gap: 6 (ref 5–15)
BUN: 23 mg/dL (ref 8–23)
CO2: 26 mmol/L (ref 22–32)
Calcium: 9.4 mg/dL (ref 8.9–10.3)
Chloride: 111 mmol/L (ref 98–111)
Creatinine, Ser: 0.91 mg/dL (ref 0.44–1.00)
GFR, Estimated: >60 mL/min (ref >60)
Glucose, Bld: 102 mg/dL — ABNORMAL HIGH (ref 70–99)
Potassium: 4.1 mmol/L (ref 3.5–5.1)
Sodium: 143 mmol/L (ref 135–145)

## 2020-12-29 LAB — PROCALCITONIN: Procalcitonin: <0.1 ng/mL

## 2020-12-29 LAB — TROPONIN I (HIGH SENSITIVITY): Troponin I (High Sensitivity): 13125 ng/L (ref <18)

## 2020-12-29 LAB — LACTIC ACID, PLASMA: Lactic Acid, Venous: 0.7 mmol/L (ref 0.5–1.9)

## 2020-12-29 LAB — PROTIME-INR
INR: 1.1 (ref 0.8–1.2)
Prothrombin Time: 14.4 seconds (ref 11.4–15.2)

## 2020-12-29 MED ORDER — HEPARIN BOLUS VIA INFUSION
4000.0000 [IU] | Freq: Once | INTRAVENOUS | Status: AC
  Administered 2020-12-29: 4000 [IU] via INTRAVENOUS
  Filled 2020-12-29: qty 4000

## 2020-12-29 MED ORDER — METOPROLOL SUCCINATE ER 25 MG PO TB24: 12.5000 mg | ORAL_TABLET | Freq: Every day | ORAL | Status: DC

## 2020-12-29 MED ORDER — HEPARIN (PORCINE) 25000 UT/250ML-% IV SOLN
900.0000 [IU]/h | INTRAVENOUS | Status: DC
Start: 2020-12-29 — End: 2020-12-31
  Administered 2020-12-29: 900 [IU]/h via INTRAVENOUS
  Filled 2020-12-29 (×2): qty 250

## 2020-12-29 MED ORDER — SODIUM CHLORIDE 0.9 % IV SOLN: INTRAVENOUS | Status: DC

## 2020-12-29 NOTE — Progress Notes (Signed)
ANTICOAGULATION CONSULT NOTE  Pharmacy Consult for heparin monitoring Indication: chest pain/ACS  No Known Allergies  Patient Measurements: Height: 5\' 5"  (165.1 cm) Weight: 63.5 kg (139 lb 15.9 oz) IBW/kg (Calculated) : 57  Vital Signs: Temp: 98.6 F (37 C) (11/12 1510) Temp Source: Oral (11/12 0854) BP: 119/78 (11/12 1510) Pulse Rate: 72 (11/12 1510)  Labs: Recent Labs    12/28/20 0958 12/28/20 1646 12/29/20 0511 12/29/20 1651  HGB 11.7*  --  10.2*  --   HCT 37.0  --  32.0*  --   PLT 201  --  177  --   APTT 31  --   --   --   LABPROT 13.4  --  14.4  --   INR 1.0  --  1.1  --   HEPARINUNFRC  --   --   --  0.82*  CREATININE 1.23*  --  0.91  --   TROPONINIHS  --  105* 13,125*  --      Estimated Creatinine Clearance: 46.6 mL/min (by C-G formula based on SCr of 0.91 mg/dL).   Medical History: Past Medical History:  Diagnosis Date   Arthritis    AV block, Mobitz II    a. 09/2016 s/p MDT 10/2016 Azure XT DR MRI DC PPM   CAD (coronary artery disease)    a. 11/2020 NSTEMI/Cath: LM mild diff dzs, LAD, diff dzs, LCX 30d, RCA Ca2+ w/ diff dzs throughout, 29m. FFR cath not able to advance-->Med Rx.   Carotid artery disease ([name])    a. 09/2016 Carotid U/S: RICA 70-99%, LICA 50-69%;  b. 09/2016 CTA Neck: RICA 60, RCCA 30, LICA 70%, L Vert 60, L Basilar 70; c. 10/2019 s/p R CEA.   Essential hypertension    History of stress test    a. 12/2016 MV: EF 51%, no ischemia/infarct; b. 10/2019 MV: EF 61%, no ischemia/infarct.   Hyperlipidemia    Hypertrophic cardiomyopathy ([name])    a. 09/2016 Echo: EF 60-65%, no rwma, Gr1 DD, Ca2+ MV annulus; b. 11/2017 Echo: EF 70-75%, no rwma, Gr1 DD, mild AS vs dynamic LVOT obs. Nl RV fxn. Hyperdynamic LVEF w/ sev LVH; c. 10/2019 Echo: EF 70-75%, no rwma, gr1 DD, sev LVH, Gr1 DD, nl PASP, triv MR, mild-mod Ao sclerosis; c. 05/2020 Echo: EF 60-65%; e. 11/2020 Echo: EF 60-65%, sev LVH, nl RV fxn, Mild MR. Ao Sclerosis.   Presence of permanent cardiac  pacemaker     Medications:  Scheduled:   aspirin EC  81 mg Oral Daily   clopidogrel  75 mg Oral Daily   dapagliflozin propanediol  10 mg Oral Daily   Finerenone  20 mg Oral Daily   fluticasone furoate-vilanterol  1 puff Inhalation Daily   And   umeclidinium bromide  1 puff Inhalation Daily   [START ON 12/30/2020] metoprolol succinate  12.5 mg Oral Daily   mirtazapine  7.5 mg Oral QHS   rosuvastatin  40 mg Oral Daily   Vibegron  75 mg Oral Daily    Assessment: 75 y.o. female with a hx of hypertension, HCM, recent STEMI, former smoker presented with hypotension, AMS and sepsis in the ED. She is not on a DOAC prior to admission based on chart review. H&H, platelets noted to be trending down slightly prior to starting heparin. Pharmacy is asked to monitor and adjust this heparin infusion that is being started due to elevated troponins/ACS  11/12 1651 HL 0.82   Goal of Therapy:  Heparin level 0.3-0.7 units/ml Monitor platelets  by anticoagulation protocol: Yes   Plan:  Heparin level is slightly supratherapeutic. Will decrease infusion to 800 units/hr. Recheck heparin level in 8 hours. CBC daily while on heparin.   Ronnald Ramp, PharmD, BCPS 12/29/2020,5:26 PM

## 2020-12-29 NOTE — Consult Note (Signed)
Cardiology Consult    Patient ID: Shannon Chen MRN: [id number], DOB/AGE: 1946/02/12   Admit date: 12/28/2020 Date of Consult: 12/29/2020  Primary Physician: Remi Haggard, FNP Primary Cardiologist: Ida Rogue, MD Requesting Provider: R. Wieting, MD  Patient Profile    Shannon Chen is a 75 y.o. female with a history of CAD, labile HTN, HCM, severe LVH, syncope and CHB s/p PPM in 09/2016, carotid dzs s/p R CEA , and remote tob abuse, who is being seen today for the evaluation of NSTEMI at the request of Dr. Leslye Peer.  Past Medical History   Past Medical History:  Diagnosis Date   Arthritis    AV block, Mobitz II    a. 09/2016 s/p MDT [id number] Azure XT DR MRI DC PPM   CAD (coronary artery disease)    a. 11/2020 NSTEMI/Cath: LM mild diff dzs, LAD, diff dzs, LCX 30d, RCA Ca2+ w/ diff dzs throughout, 82m. FFR cath not able to advance-->Med Rx.   Carotid artery disease ([place])    a. 09/2016 Carotid U/S: RICA [id number], LICA [id number];  b. [id number] CTA Neck: RICA 60, RCCA 30, LICA [id number], L Vert 60, L Basilar 70; c. 10/2019 s/p R CEA.   Essential hypertension    History of stress test    a. 12/2016 MV: EF 51%, no ischemia/infarct; b. 10/2019 MV: EF 61%, no ischemia/infarct.   Hyperlipidemia    Hypertrophic cardiomyopathy ([place])    a. 09/2016 Echo: EF 60-65%, no rwma, Gr1 DD, Ca2+ MV annulus; b. 11/2017 Echo: EF 70-75%, no rwma, Gr1 DD, mild AS vs dynamic LVOT obs. Nl RV fxn. Hyperdynamic LVEF w/ sev LVH; c. 10/2019 Echo: EF 70-75%, no rwma, gr1 DD, sev LVH, Gr1 DD, nl PASP, triv MR, mild-mod Ao sclerosis; c. 05/2020 Echo: EF 60-65%; e. 11/2020 Echo: EF 60-65%, sev LVH, nl RV fxn, Mild MR. Ao Sclerosis.   Presence of permanent cardiac pacemaker     Past Surgical History:  Procedure Laterality Date   ABDOMINAL HYSTERECTOMY     ENDARTERECTOMY Right 11/16/2019   Procedure: ENDARTERECTOMY CAROTID;  Surgeon: Katha Cabal, MD;  Location: [facility];  Service: Vascular;  Laterality: Right;    INTRAVASCULAR PRESSURE WIRE/FFR STUDY N/A 12/13/2020   Procedure: INTRAVASCULAR PRESSURE WIRE/FFR STUDY;  Surgeon: Leonie Man, MD;  Location: [place] [facility];  Service: Cardiovascular;  Laterality: N/A;   LEFT HEART CATH AND CORONARY ANGIOGRAPHY N/A 12/13/2020   Procedure: LEFT HEART CATH AND CORONARY ANGIOGRAPHY;  Surgeon: Leonie Man, MD;  Location: [place] [facility];  Service: Cardiovascular;  Laterality: N/A;   PACEMAKER IMPLANT N/A 10/08/2016   Procedure: Pacemaker Implant;  Surgeon: Evans Lance, MD;  Location: [place] [facility];  Service: Cardiovascular;  Laterality: N/A;     Allergies  No Known Allergies  History of Present Illness    75 year old female with a history of CAD, labile hypertension, hypertrophic cardiomyopathy, severe LVH with normal EF, carotid stenosis status post right CEA, syncope and complete heart block status post permanent pacemaker placement in August 2018, and remote tobacco abuse.  She lives at a skilled nursing facility.  In April 2022, she was admitted to [facility] with generalized weakness.  High-sensitivity troponin was elevated 204.  Echo showed an EF of 60 to 65% with severe concentric LVH and grade 1 diastolic dysfunction.  Conservative therapy was recommended.  More recently, she was admitted to [facility] in late October with presyncope and diaphoresis.  Troponin was elevated to a  peak of 4062.  Echo showed an EF of 60 to 65% with severe LVH, mild MR, and mild to moderate aortic sclerosis.  She was treated with heparin for 48 hours and underwent diagnostic catheterization which revealed severely calcified and tortuous right coronary artery with a 70% stenosis.  The interventional team was unable to pass an FFR catheter.  The vessel was felt likely challenging for PCI given calcified, small, and tortuous nature of medical therapy was recommended.  Complex PCI could be considered for refractory angina.  She was discharged home  on beta-blocker, statin, and dual antiplatelet therapy.  [name] stays in a skilled nursing facility.  She is unclear as to the details of the past few days but notes that she was feeling poorly and weak at home.  She was not experiencing any chest pain or shortness of breath.  Due to altered mental status, she was taken to the emergency department on November 11.  There, she was found to be hypotensive with pressures in the 80s.  Creatinine was elevated at above prior baseline at 1.23.  Lactic acid was elevated at 3.6.  Initial troponin was 105.  CT of the chest, abdomen, and pelvis, showed a 3 mm right upper lobe nodule.  Moderate gallbladder distention was noted with dilated common bile duct and mild intrahepatic biliary dilatation.  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  Chest x-ray showed no active disease.  Initial ECG suggested atrial fibrillation though notes indicate monitoring at the same time showed sinus rhythm.  Upon further review of twelve-lead, sinus rhythm with PACs is noted.  Pacemaker device interrogation this morning does not show any evidence of atrial fibrillation/atrial tachycardia on the day of admission.  She was treated with IV fluids and placed on azithromycin.  This morning, troponin rose to 13,125 and [name] was placed on heparin.  She denies any history of chest pain or dyspnea recently and as noted above, is unclear on the events surrounding her admission.  Inpatient Medications     aspirin EC  81 mg Oral Daily   azithromycin  250 mg Oral Daily   clopidogrel  75 mg Oral Daily   dapagliflozin propanediol  10 mg Oral Daily   Finerenone  20 mg Oral Daily   fluticasone furoate-vilanterol  1 puff Inhalation Daily   And   umeclidinium bromide  1 puff Inhalation Daily   metoprolol succinate  50 mg Oral Daily   mirtazapine  7.5 mg Oral QHS   rosuvastatin  40 mg Oral Daily   Vibegron  75 mg Oral Daily     Family History    Family History  Problem Relation Age of Onset   Diabetes Mother    CAD Father    She indicated that her mother is deceased. She indicated that her father is deceased.   Social History    Social History   Socioeconomic History   Marital status: Single    Spouse name: Not on file   Number of children: Not on file   Years of education: Not on file   Highest education level: Not on file  Occupational History   Not on file  Tobacco Use   Smoking status: Former    Packs/day: 1.00    Years: 30.00    Pack years: 30.00    Types: Cigarettes    Quit date: 11/09/2018    Years since quitting: 2.1   Smokeless tobacco: Never  Vaping Use   Vaping Use: Never used  Substance and Sexual Activity   Alcohol use: No   Drug use: No   Sexual activity: Not on file  Other Topics Concern   Not on file  Social History Narrative   ** Merged History Encounter **       Independent at baseline Ambulates without any assistance. Lives by herself   Social Determinants of Health   Financial Resource Strain: Not on file  Food Insecurity: Not on file  Transportation Needs: Not on file  Physical Activity: Not on file  Stress: Not on file  Social Connections: Not on file  Intimate Partner Violence: Not on file     Review of Systems    General:  +++ Generalized malaise and weakness.  No chills, fever, night sweats or weight changes.  Cardiovascular:  No chest pain, dyspnea on exertion, edema, orthopnea, palpitations, paroxysmal nocturnal dyspnea. Dermatological: No rash, lesions/masses Respiratory: No cough, dyspnea Urologic: No hematuria, dysuria Abdominal:   No nausea, vomiting, diarrhea, bright red blood per rectum, melena, or hematemesis Neurologic:  No visual changes, +++ generalized wkns, +++ report of altered mental status at skilled nursing facility.   All other systems reviewed and are otherwise negative except as noted above.  Physical Exam    Blood  pressure (!) 103/47, pulse 81, temperature 97.9 F (36.6 C), temperature source Oral, resp. rate 18, height 5\' 5"  (1.651 m), weight 63.5 kg, SpO2 94 %.  General: Pleasant, NAD Psych: Normal affect. Neuro: Alert and oriented X 3. Moves all extremities spontaneously. HEENT: Poor dentition. Neck: Supple without bruits or JVD. Lungs:  Resp regular and unlabored, diminished breath sounds bilaterally.  Heart: RRR, 2/6 systolic murmur throughout, no s3, s4, or murmurs. Abdomen: Soft, non-tender, non-distended, BS + x 4.  Extremities: No clubbing, cyanosis or edema. DP/PT 1+, Radials 2+ and equal bilaterally.  Labs    Cardiac Enzymes Recent Labs  Lab 12/12/20 1743 12/12/20 2224 12/13/20 0536 12/28/20 1646 12/29/20 0511  TROPONINIHS 3,313* 2,419* 1,344* 105* 13,125*      Lab Results  Component Value Date   WBC 4.1 12/29/2020   HGB 10.2 (L) 12/29/2020   HCT 32.0 (L) 12/29/2020   MCV 85.6 12/29/2020   PLT 177 12/29/2020    Recent Labs  Lab 12/28/20 0958 12/29/20 0511  NA 139 143  K 3.5 4.1  CL 108 111  CO2 23 26  BUN 35* 23  CREATININE 1.23* 0.91  CALCIUM 9.3 9.4  PROT 7.3  --   BILITOT 1.2  --   ALKPHOS 56  --   ALT 40  --   AST 31  --   GLUCOSE 185* 102*   Lab Results  Component Value Date   CHOL 127 12/13/2020   HDL 57 12/13/2020   LDLCALC 61 12/13/2020   TRIG 43 12/13/2020      Radiology Studies   CT CHEST ABDOMEN PELVIS W CONTRAST  Result Date: 12/28/2020 CLINICAL DATA:  Sepsis, left flank pain. EXAM: CT CHEST, ABDOMEN, AND PELVIS WITH CONTRAST TECHNIQUE: Multidetector CT imaging of the chest, abdomen and pelvis was performed following the standard protocol during bolus administration of intravenous contrast. CONTRAST:  48mL OMNIPAQUE IOHEXOL 300 MG/ML  SOLN COMPARISON:  March 14, 2020. FINDINGS: CT CHEST FINDINGS Cardiovascular: Atherosclerosis of thoracic aorta is noted without aneurysm formation. Normal cardiac size. No pericardial effusion. Coronary  artery calcifications are noted. Mediastinum/Nodes: No enlarged mediastinal, hilar, or axillary lymph nodes. Thyroid gland, trachea, and esophagus demonstrate no  significant findings. Lungs/Pleura: No pneumothorax or pleural effusion is noted. Left lower lobe opacity noted on prior exam is no longer visualized. Minimal emphysematous disease is noted bilaterally. 3 mm nodule is noted laterally in right upper lobe best seen on image number 41 of series 7. Musculoskeletal: No chest wall mass or suspicious bone lesions identified. CT ABDOMEN PELVIS FINDINGS Hepatobiliary: Liver is unremarkable. Moderate gallbladder distention is noted with dilated common bile duct and mild intrahepatic biliary dilatation. No cholelithiasis is noted. Pancreas: No inflammatory changes noted. Stable pancreatic ductal dilatation is noted which is unchanged. Spleen: Normal in size without focal abnormality. Adrenals/Urinary Tract: Adrenal glands are unremarkable. Kidneys are normal, without renal calculi, focal lesion, or hydronephrosis. Bladder is unremarkable. Stomach/Bowel: Stomach is within normal limits. Appendix appears normal. No evidence of bowel wall thickening, distention, or inflammatory changes. Vascular/Lymphatic: Aortic atherosclerosis. No enlarged abdominal or pelvic lymph nodes. 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 2.4 cm left common iliac artery aneurysm is noted. 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 Reproductive: Status post hysterectomy. No adnexal masses. Other: No abdominal wall hernia or abnormality. No abdominopelvic ascites. Musculoskeletal: No acute or significant osseous findings. IMPRESSION: 3 mm nodule seen in right upper lobe. No follow-up needed if patient is low-risk. Non-contrast chest CT can be considered in 12 months if patient is high-risk. This recommendation follows the consensus statement: Guidelines for Management of Incidental Pulmonary Nodules Detected on CT Images: From  the Fleischner Society 2017; Radiology 2017; 284:228-243. No cholelithiasis is noted. Moderate gallbladder distention is noted with dilated common bile duct and mild intrahepatic biliary dilatation. Correlation with liver function tests is recommended to rule out biliary obstruction. MRCP may be performed for further evaluation. 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 Aortic Atherosclerosis (ICD10-I70.0). Electronically Signed   By: Marijo Conception M.D.   On: 12/28/2020 15:59   DG Chest Port 1 View  Result Date: 12/28/2020 CLINICAL DATA:  Chest pain. EXAM: PORTABLE CHEST 1 VIEW COMPARISON:  December 12, 2020. FINDINGS: Stable cardiomediastinal silhouette. Left-sided pacemaker is unchanged in position. Both lungs are clear. The visualized skeletal structures are unremarkable. IMPRESSION: No active disease. Aortic Atherosclerosis (ICD10-I70.0). Electronically Signed   By: Marijo Conception M.D.   On: 12/28/2020 09:51   ECG & Cardiac Imaging    Sinus rhythm with frequent PACs, 111, RBBB, baseline artifact - personally reviewed.  Assessment & Plan    1.  Non-STEMI: Patient with recent admission for non-STEMI in late October with echocardiogram showing normal LV function with severe LVH in the setting of known hypertrophic cardiomyopathy.  Diagnostic catheterization revealed moderate to severe, diffuse, heavily calcified RCA disease with a 70% mid stenosis and otherwise nonobstructive disease.  The interventional team was unable to measure fractional flow reserve due to tortuosity and calcification and ultimately, decision was made to continue medical therapy with dual antiplatelet therapy, beta-blocker, and statin.  Ms. Vacchiano was readmitted November 11 with weakness and altered mental status.  She was hypotensive on arrival with elevated lactic acid, tachycardia, and acute kidney injury with a rise in creatinine to 1.23.  Initial troponin was 103, which was  initially felt to be coming down from her last admission however, follow-up troponin this morning was markedly elevated at 13,125.  In that setting, she has been placed on heparin.  She denies experiencing chest pain or dyspnea prior to admission or currently.  Agree with continuation of aspirin, Plavix, and  statin therapy as well as 48 hours of heparin.  Agree with holding beta-blocker in the setting of hypotension with ongoing soft blood pressures this morning.  Suspect she has had significant demand ischemia in the setting of hypotension/possible sepsis and known moderate to severe right coronary artery disease, severe LVH/hypertrophic cardiomyopathy, and probable dehydration with evidence of acute kidney injury on presentation.  Limited echo today to evaluate LV function.  If new LV dysfunction noted, we will have our interventional team review her films on Monday morning to determine whether or not she would possibly be a candidate for PCI of the right coronary artery.  2.  Possible sepsis/hypotension: Patient presented with altered mental status and hypotension.  Lactate acid was elevated and creatinine was elevated above prior baseline.  She has received intravenous fluids and creatinine has normalized this morning.  She is being covered with antibiotics per the primary team.  Blood pressures remain soft and will continue to hold off on home dose of beta-blocker.  3.  Hypertrophic cardiomyopathy: Follow-up limited echo today.  Ensure adequate hydration.  Beta-blocker currently on hold in the setting of relative hypotension.  4.  Frequent premature atrial contractions: On admission, patient was tachycardic with initial ECG suggested atrial fibrillation.  I have interrogated her pacemaker using [employer] express and this shows a less than 0.3% burden of atrial arrhythmias and no evidence of atrial arrhythmias on admission.  She has been in sinus rhythm with PACs on telemetry and thus, there is no evidence  of A. fib.  Beta-blocker currently on hold in the setting of hypotension.  5.  Acute kidney injury: In the setting of hypotension and possible sepsis.  Creatinine normalized this morning following IV hydration.  Follow as she did receive contrast yesterday.  6.  Normocytic anemia: H&H down slightly from admission.  Following heparin.  Signed, Murray Hodgkins, NP 12/29/2020, 1:21 PM  For questions or updates, please contact   Please consult [email or web address] for contact info under Cardiology/STEMI.

## 2020-12-29 NOTE — Progress Notes (Signed)
ANTICOAGULATION CONSULT NOTE  Pharmacy Consult for heparin monitoring Indication: chest pain/ACS  No Known Allergies  Patient Measurements: Height: 5\' 5"  (165.1 cm) Weight: 63.5 kg (139 lb 15.9 oz) IBW/kg (Calculated) : 57  Vital Signs: Temp: 97.5 F (36.4 C) (11/12 0429) Temp Source: Oral (11/12 0429) BP: 161/79 (11/12 0429) Pulse Rate: 72 (11/12 0429)  Labs: Recent Labs    12/28/20 0958 12/28/20 1646 12/29/20 0511  HGB 11.7*  --  10.2*  HCT 37.0  --  32.0*  PLT 201  --  177  APTT 31  --   --   LABPROT 13.4  --  14.4  INR 1.0  --  1.1  CREATININE 1.23*  --  0.91  TROPONINIHS  --  105* 13,125*    Estimated Creatinine Clearance: 46.6 mL/min (by C-G formula based on SCr of 0.91 mg/dL).   Medical History: Past Medical History:  Diagnosis Date   Arthritis    AV block, Mobitz II    a. 09/2016 s/p MDT 10/2016 Azure XT DR MRI DC PPM   Carotid artery disease ([name])    a. 09/2016 Carotid U/S: RICA 70-99%, LICA 50-69%;  b. 09/2016 CTA Neck: RICA 60, RCCA 30, LICA 70%, L Vert 60, L Basilar 70; c. 10/2019 s/p R CEA.   Essential hypertension    History of stress test    a. 12/2016 MV: EF 51%, no ischemia/infarct; b. 10/2019 MV: EF 61%, no ischemia/infarct.   Hyperlipidemia    Hypertrophic cardiomyopathy ([name])    a. 09/2016 Echo: EF 60-65%, no rwma, Gr1 DD, Ca2+ MV annulus; b. 11/2017 Echo: EF 70-75%, no rwma, Gr1 DD, mild AS vs dynamic LVOT obs. Nl RV fxn. Hyperdynamic LVEF w/ sev LVH; c. 10/2019 Echo: EF 70-75%, no rwma, gr1 DD, sev LVH, Gr1 DD, nl PASP, triv MR, mild-mod Ao sclerosis; c. 05/2020 Echo: EF 60-65%, no rwma, sev conc LVH, gr1 DD, nl RV fxn. RVSP 21.61mmHg. Mildly dil LA.   Presence of permanent cardiac pacemaker     Medications:  Scheduled:   aspirin EC  81 mg Oral Daily   azithromycin  250 mg Oral Daily   clopidogrel  75 mg Oral Daily   dapagliflozin propanediol  10 mg Oral Daily   Finerenone  20 mg Oral Daily   fluticasone furoate-vilanterol  1 puff Inhalation  Daily   And   umeclidinium bromide  1 puff Inhalation Daily   metoprolol succinate  50 mg Oral Daily   mirtazapine  7.5 mg Oral QHS   rosuvastatin  40 mg Oral Daily   Vibegron  75 mg Oral Daily    Assessment: 75 y.o. female with a hx of hypertension, HCM, recent STEMI, former smoker presented with hypotension, AMS and sepsis in the ED. She is not on a DOAC prior to admission based on chart review. H&H, platelets noted to be trending down slightly prior to starting heparin. Pharmacy is asked to monitor and adjust this heparin infusion that is being started due to elevated troponins/ACS  Goal of Therapy:  Heparin level 0.3-0.7 units/ml Monitor platelets by anticoagulation protocol: Yes   Plan:  Give 4000 units bolus x 1 Start heparin infusion at 900 units/hr (rate based on data from recent admission) Check anti-Xa level in 8 hours and daily while on heparin Continue to monitor H&H and platelets  62 12/29/2020,7:29 AM

## 2020-12-29 NOTE — Progress Notes (Signed)
Shannon KhanPatient ID: Shannon Chen, female   DOB: Jul 06, 1945, 75 y.o.   MRN: [id number] Shannon Chen PROGRESS NOTE  Shannon Chen [id number] DOB: 11/02/1945 DOA: 12/28/2020 PCP: Shannon Gang, FNP  HPI/Subjective: Patient feels okay today.  Shannon complaints of chest pain or shortness of breath.  Today's troponin 13,125.  Still feels weak.  Objective: Vitals:   12/29/20 0429 12/29/20 0854  BP: (!) 161/79 (!) 103/47  Pulse: 72 81  Resp: 16 18  Temp: (!) 97.5 F (36.4 C) 97.9 F (36.6 C)  SpO2: 97% 94%    Intake/Output Summary (Last 24 hours) at 12/29/2020 1351 Last data filed at 12/29/2020 1229 Gross per 24 hour  Intake 1525.74 ml  Output 2900 ml  Net -1374.26 ml   Filed Weights   12/28/20 0947  Weight: 63.5 kg    ROS: Review of Systems  Constitutional:  Positive for malaise/fatigue.  Respiratory:  Negative for shortness of breath.   Cardiovascular:  Negative for chest pain.  Gastrointestinal:  Negative for abdominal pain, nausea and vomiting.  Exam: Physical Exam HENT:     Head: Normocephalic.     Mouth/Throat:     Pharynx: Shannon oropharyngeal exudate.  Eyes:     General: Lids are normal.     Conjunctiva/sclera: Conjunctivae normal.  Cardiovascular:     Rate and Rhythm: Normal rate and regular rhythm.     Heart sounds: Normal heart sounds, S1 normal and S2 normal.  Pulmonary:     Breath sounds: Examination of the right-lower field reveals decreased breath sounds. Examination of the left-lower field reveals decreased breath sounds. Decreased breath sounds present. Shannon wheezing, rhonchi or rales.  Abdominal:     Palpations: Abdomen is soft.     Tenderness: There is Shannon abdominal tenderness.  Musculoskeletal:     Right lower leg: Shannon swelling.     Left lower leg: Shannon swelling.  Skin:    General: Skin is warm.     Findings: Shannon rash.  Neurological:     Mental Status: She is alert.     Comments: Answers questions appropriately      Scheduled Meds:   aspirin EC  81 mg Oral Daily   clopidogrel  75 mg Oral Daily   dapagliflozin propanediol  10 mg Oral Daily   Finerenone  20 mg Oral Daily   fluticasone furoate-vilanterol  1 puff Inhalation Daily   And   umeclidinium bromide  1 puff Inhalation Daily   [START ON 12/30/2020] metoprolol succinate  12.5 mg Oral Daily   mirtazapine  7.5 mg Oral QHS   rosuvastatin  40 mg Oral Daily   Vibegron  75 mg Oral Daily   Continuous Infusions:  sodium chloride     heparin 900 Units/hr (12/29/20 0818)    Assessment/Plan:  NSTEMI.  Troponin 105 yesterday evening and jumped up to 13,125 this morning.  Patient not having chest pain or shortness of breath.  We will start heparin drip.  Continue aspirin, Plavix and Crestor.  Appreciate cardiology consultation.  Recent cardiac cath and recommended medical management. Hypotension initially.  Continue IV fluid hydration.  Decrease Toprol dose.  Check orthostatics. SIRS.  Sepsis ruled out.  So far cultures are negative.  Discontinue antibiotics with procalcitonin being negative. Cardiology interrogated the pacer and there is Shannon atrial fibrillation COPD.  Continue Trelegy inhaler Type 2 diabetes mellitus with hyperlipidemia.  Continue Crestor.  Hemoglobin A1c 6.7.  Diet controlled. Acute kidney injury on CKD 2 with dehydration. Creatinine of 1.23  on presentation and dropped down to 0.91 with IV fluids Weakness History of Mobitz type II block with pacemaker History of hypertrophic cardiomyopathy     Code Status:     Code Status Orders  (From admission, onward)           Start     Ordered   12/28/20 1715  Full code  Continuous        12/28/20 1716           Code Status History     Date Active Date Inactive Code Status Order ID Comments User Context   12/12/2020 1427 12/17/2020 1641 Full Code [id number]  Chen, Ava, DO ED   05/18/2020 1758 05/21/2020 1627 Full Code [id number]  Shannon Shutters, MD ED   11/16/2019 1803 11/18/2019 1944 Full Code  [id number]  Chen, Shannon Craver, MD Inpatient   11/06/2019 2041 11/08/2019 2138 Full Code [id number]  Shannon Quest, MD ED   04/24/2018 1837 04/26/2018 2027 Full Code [id number]  Ihor Austin, MD Inpatient   11/17/2017 1653 11/20/2017 1514 Full Code [id number]  Milagros Loll, MD ED   10/05/2016 1444 10/10/2016 0233 Full Code [id number]  Jodelle Gross, NP Inpatient   10/03/2016 1820 10/05/2016 1342 Full Code [id number]  Enid Baas, MD ED      Family Communication: Declined Disposition Plan: Status is: Inpatient  Consultants: Cardiology  Antibiotics: Discontinued  Time spent: 27 minutes  Shannon Chen [employer]

## 2020-12-30 ENCOUNTER — Inpatient Hospital Stay (HOSPITAL_COMMUNITY)
Admit: 2020-12-30 | Discharge: 2020-12-30 | Disposition: A | Discharge status: Home / Self Care | Payer: Medicare HMO | Attending: Nurse Practitioner | Admitting: Nurse Practitioner

## 2020-12-30 DIAGNOSIS — I491 Atrial premature depolarization: Secondary | ICD-10-CM

## 2020-12-30 DIAGNOSIS — I214 Non-ST elevation (NSTEMI) myocardial infarction: Secondary | ICD-10-CM | POA: Diagnosis not present

## 2020-12-30 DIAGNOSIS — E7800 Pure hypercholesterolemia, unspecified: Secondary | ICD-10-CM

## 2020-12-30 DIAGNOSIS — R778 Other specified abnormalities of plasma proteins: Secondary | ICD-10-CM | POA: Diagnosis not present

## 2020-12-30 DIAGNOSIS — I951 Orthostatic hypotension: Secondary | ICD-10-CM | POA: Diagnosis not present

## 2020-12-30 DIAGNOSIS — N179 Acute kidney failure, unspecified: Secondary | ICD-10-CM | POA: Diagnosis not present

## 2020-12-30 DIAGNOSIS — I421 Obstructive hypertrophic cardiomyopathy: Secondary | ICD-10-CM | POA: Diagnosis not present

## 2020-12-30 LAB — CORTISOL-AM, BLOOD: Cortisol - AM: 5.3 ug/dL — ABNORMAL LOW (ref 6.7–22.6)

## 2020-12-30 LAB — ECHOCARDIOGRAM LIMITED
AR max vel: 1.37 cm2
AV Area VTI: 1.45 cm2
AV Area mean vel: 1.37 cm2
AV Mean grad: 11.5 mmHg
AV Peak grad: 23.9 mmHg
Ao pk vel: 2.45 m/s
Area-P 1/2: 2.82 cm2
Height: 65 in
S' Lateral: 3.1 cm
Weight: 2243.4 oz

## 2020-12-30 LAB — URINE CULTURE

## 2020-12-30 LAB — CBC
HCT: 30.6 % — ABNORMAL LOW (ref 36.0–46.0)
Hemoglobin: 9.9 g/dL — ABNORMAL LOW (ref 12.0–15.0)
MCH: 27.3 pg (ref 26.0–34.0)
MCHC: 32.4 g/dL (ref 30.0–36.0)
MCV: 84.5 fL (ref 80.0–100.0)
Platelets: 171 10*3/uL (ref 150–400)
RBC: 3.62 MIL/uL — ABNORMAL LOW (ref 3.87–5.11)
RDW: 14.4 % (ref 11.5–15.5)
WBC: 4.4 10*3/uL (ref 4.0–10.5)
nRBC: 0 % (ref 0.0–0.2)

## 2020-12-30 LAB — TROPONIN I (HIGH SENSITIVITY)
Troponin I (High Sensitivity): 4072 ng/L (ref <18)
Troponin I (High Sensitivity): 4442 ng/L (ref <18)

## 2020-12-30 LAB — LIPID PANEL
Cholesterol: 121 mg/dL (ref 0–200)
HDL: 57 mg/dL (ref >40)
LDL Cholesterol: 57 mg/dL (ref 0–99)
Total CHOL/HDL Ratio: 2.1 RATIO
Triglycerides: 34 mg/dL (ref <150)
VLDL: 7 mg/dL (ref 0–40)

## 2020-12-30 LAB — HEPARIN LEVEL (UNFRACTIONATED)
Heparin Unfractionated: 0.5 IU/mL (ref 0.30–0.70)
Heparin Unfractionated: 0.2 IU/mL — ABNORMAL LOW (ref 0.30–0.70)
Heparin Unfractionated: 0.33 IU/mL (ref 0.30–0.70)

## 2020-12-30 MED ORDER — METOPROLOL SUCCINATE ER 25 MG PO TB24
25.0000 mg | ORAL_TABLET | Freq: Every day | ORAL | Status: DC
  Administered 2020-12-30 – 2021-01-02 (×4): 25 mg via ORAL
  Filled 2020-12-30 (×4): qty 1

## 2020-12-30 MED ORDER — HYDRALAZINE HCL 20 MG/ML IJ SOLN
10.0000 mg | INTRAMUSCULAR | Status: DC | PRN
  Administered 2020-12-30 – 2021-01-03 (×3): 10 mg via INTRAVENOUS
  Filled 2020-12-30 (×3): qty 1

## 2020-12-30 MED ORDER — HEPARIN BOLUS VIA INFUSION
1000.0000 [IU] | Freq: Once | INTRAVENOUS | Status: AC
  Administered 2020-12-30: 1000 [IU] via INTRAVENOUS
  Filled 2020-12-30: qty 1000

## 2020-12-30 MED ORDER — POLYETHYLENE GLYCOL 3350 17 G PO PACK
17.0000 g | PACK | Freq: Every day | ORAL | Status: DC
  Administered 2020-12-30 – 2021-01-04 (×5): 17 g via ORAL
  Filled 2020-12-30 (×6): qty 1

## 2020-12-30 NOTE — Progress Notes (Signed)
*  PRELIMINARY RESULTS* Echocardiogram 2D Echocardiogram has been performed.  Shannon Chen 12/30/2020, 4:21 PM

## 2020-12-30 NOTE — Progress Notes (Signed)
ANTICOAGULATION CONSULT NOTE  Pharmacy Consult for heparin monitoring Indication: chest pain/ACS  No Known Allergies  Patient Measurements: Height: 5\' 5"  (165.1 cm) Weight: 63.6 kg (140 lb 3.4 oz) IBW/kg (Calculated) : 57  Vital Signs: Temp: 98.7 F (37.1 C) (11/13 1100) BP: 163/70 (11/13 1100) Pulse Rate: 70 (11/13 1100)  Labs: Recent Labs    12/28/20 0958 12/28/20 1646 12/29/20 0511 12/29/20 1651 12/30/20 0157 12/30/20 0548 12/30/20 0743 12/30/20 1008  HGB 11.7*  --  10.2*  --  9.9*  --   --   --   HCT 37.0  --  32.0*  --  30.6*  --   --   --   PLT 201  --  177  --  171  --   --   --   APTT 31  --   --   --   --   --   --   --   LABPROT 13.4  --  14.4  --   --   --   --   --   INR 1.0  --  1.1  --   --   --   --   --   HEPARINUNFRC  --   --   --  0.82* 0.50  --   --  0.20*  CREATININE 1.23*  --  0.91  --   --   --   --   --   TROPONINIHS  --    < > 13,125*  --   --  4,072* 4,442*  --    < > = values in this interval not displayed.     Estimated Creatinine Clearance: 46.6 mL/min (by C-G formula based on SCr of 0.91 mg/dL).   Medical History: Past Medical History:  Diagnosis Date   Arthritis    AV block, Mobitz II    a. 09/2016 s/p MDT 10/2016 Azure XT DR MRI DC PPM   CAD (coronary artery disease)    a. 11/2020 NSTEMI/Cath: LM mild diff dzs, LAD, diff dzs, LCX 30d, RCA Ca2+ w/ diff dzs throughout, 51m. FFR cath not able to advance-->Med Rx.   Carotid artery disease ([name])    a. 09/2016 Carotid U/S: RICA 70-99%, LICA 50-69%;  b. 09/2016 CTA Neck: RICA 60, RCCA 30, LICA 70%, L Vert 60, L Basilar 70; c. 10/2019 s/p R CEA.   Essential hypertension    History of stress test    a. 12/2016 MV: EF 51%, no ischemia/infarct; b. 10/2019 MV: EF 61%, no ischemia/infarct.   Hyperlipidemia    Hypertrophic cardiomyopathy ([name])    a. 09/2016 Echo: EF 60-65%, no rwma, Gr1 DD, Ca2+ MV annulus; b. 11/2017 Echo: EF 70-75%, no rwma, Gr1 DD, mild AS vs dynamic LVOT obs. Nl RV fxn.  Hyperdynamic LVEF w/ sev LVH; c. 10/2019 Echo: EF 70-75%, no rwma, gr1 DD, sev LVH, Gr1 DD, nl PASP, triv MR, mild-mod Ao sclerosis; c. 05/2020 Echo: EF 60-65%; e. 11/2020 Echo: EF 60-65%, sev LVH, nl RV fxn, Mild MR. Ao Sclerosis.   Presence of permanent cardiac pacemaker     Medications:  Scheduled:   aspirin EC  81 mg Oral Daily   clopidogrel  75 mg Oral Daily   dapagliflozin propanediol  10 mg Oral Daily   Finerenone  20 mg Oral Daily   fluticasone furoate-vilanterol  1 puff Inhalation Daily   And   umeclidinium bromide  1 puff Inhalation Daily   metoprolol succinate  25 mg Oral Daily  mirtazapine  7.5 mg Oral QHS   polyethylene glycol  17 g Oral Daily   rosuvastatin  40 mg Oral Daily   Vibegron  75 mg Oral Daily    Assessment: 75 y.o. female with a hx of hypertension, HCM, recent STEMI, former smoker presented with hypotension, AMS and sepsis in the ED. She is not on a DOAC prior to admission based on chart review. H&H, platelets noted to be trending down slightly prior to starting heparin. Pharmacy is asked to monitor and adjust this heparin infusion that is being started due to elevated troponins/ACS  11/12 1651 HL 0.82  11/13 0157 HL 0.50, therapeutic X 1  11/13 1008 HL 0.20  subthera bolus/inc to 900 u/hr  Goal of Therapy:  Heparin level 0.3-0.7 units/ml Monitor platelets by anticoagulation protocol: Yes   Plan:  11/13 1008 HL 0.20    subtherapeutic  Will order bolus 1000 units x1 and increase drip rate to 900 units/hr Recheck HL in 8 hrs Per Cardiology MD note 11/13: IV heparin for 24 more hours  CBC daily  Brandyce Dimario A, PharmD 12/30/2020,11:31 AM

## 2020-12-30 NOTE — Progress Notes (Signed)
ANTICOAGULATION CONSULT NOTE  Pharmacy Consult for heparin monitoring Indication: chest pain/ACS  No Known Allergies  Patient Measurements: Height: 5\' 5"  (165.1 cm) Weight: 63.6 kg (140 lb 3.4 oz) IBW/kg (Calculated) : 57  Vital Signs: Temp: 98.4 F (36.9 C) (11/13 0013) Temp Source: Oral (11/12 2032) BP: 189/86 (11/13 0013) Pulse Rate: 69 (11/13 0013)  Labs: Recent Labs    12/28/20 0958 12/28/20 1646 12/29/20 0511 12/29/20 1651 12/30/20 0157  HGB 11.7*  --  10.2*  --  9.9*  HCT 37.0  --  32.0*  --  30.6*  PLT 201  --  177  --  171  APTT 31  --   --   --   --   LABPROT 13.4  --  14.4  --   --   INR 1.0  --  1.1  --   --   HEPARINUNFRC  --   --   --  0.82* 0.50  CREATININE 1.23*  --  0.91  --   --   TROPONINIHS  --  105* 13,125*  --   --      Estimated Creatinine Clearance: 46.6 mL/min (by C-G formula based on SCr of 0.91 mg/dL).   Medical History: Past Medical History:  Diagnosis Date   Arthritis    AV block, Mobitz II    a. 09/2016 s/p MDT 10/2016 Azure XT DR MRI DC PPM   CAD (coronary artery disease)    a. 11/2020 NSTEMI/Cath: LM mild diff dzs, LAD, diff dzs, LCX 30d, RCA Ca2+ w/ diff dzs throughout, 22m. FFR cath not able to advance-->Med Rx.   Carotid artery disease ([name])    a. 09/2016 Carotid U/S: RICA 70-99%, LICA 50-69%;  b. 09/2016 CTA Neck: RICA 60, RCCA 30, LICA 70%, L Vert 60, L Basilar 70; c. 10/2019 s/p R CEA.   Essential hypertension    History of stress test    a. 12/2016 MV: EF 51%, no ischemia/infarct; b. 10/2019 MV: EF 61%, no ischemia/infarct.   Hyperlipidemia    Hypertrophic cardiomyopathy ([name])    a. 09/2016 Echo: EF 60-65%, no rwma, Gr1 DD, Ca2+ MV annulus; b. 11/2017 Echo: EF 70-75%, no rwma, Gr1 DD, mild AS vs dynamic LVOT obs. Nl RV fxn. Hyperdynamic LVEF w/ sev LVH; c. 10/2019 Echo: EF 70-75%, no rwma, gr1 DD, sev LVH, Gr1 DD, nl PASP, triv MR, mild-mod Ao sclerosis; c. 05/2020 Echo: EF 60-65%; e. 11/2020 Echo: EF 60-65%, sev LVH, nl RV fxn,  Mild MR. Ao Sclerosis.   Presence of permanent cardiac pacemaker     Medications:  Scheduled:   aspirin EC  81 mg Oral Daily   clopidogrel  75 mg Oral Daily   dapagliflozin propanediol  10 mg Oral Daily   Finerenone  20 mg Oral Daily   fluticasone furoate-vilanterol  1 puff Inhalation Daily   And   umeclidinium bromide  1 puff Inhalation Daily   metoprolol succinate  12.5 mg Oral Daily   mirtazapine  7.5 mg Oral QHS   rosuvastatin  40 mg Oral Daily   Vibegron  75 mg Oral Daily    Assessment: 75 y.o. female with a hx of hypertension, HCM, recent STEMI, former smoker presented with hypotension, AMS and sepsis in the ED. She is not on a DOAC prior to admission based on chart review. H&H, platelets noted to be trending down slightly prior to starting heparin. Pharmacy is asked to monitor and adjust this heparin infusion that is being started due to elevated  troponins/ACS  11/12 1651 HL 0.82  11/13 0157 HL 0.50, therapeutic X 1   Goal of Therapy:  Heparin level 0.3-0.7 units/ml Monitor platelets by anticoagulation protocol: Yes   Plan:  11/13:  HL @ 0157 = 0.50, therapeutic X 1 Will continue pt on current rate and draw confirmation level in 8 hrs on 11/13 @ 1000.   Ryne Mctigue D, PharmD 12/30/2020,3:24 AM

## 2020-12-30 NOTE — Progress Notes (Signed)
ANTICOAGULATION CONSULT NOTE  Pharmacy Consult for heparin monitoring Indication: chest pain/ACS  No Known Allergies  Patient Measurements: Height: 5\' 5"  (165.1 cm) Weight: 63.6 kg (140 lb 3.4 oz) IBW/kg (Calculated) : 57  Vital Signs: Temp: 98.4 F (36.9 C) (11/13 2044) BP: 158/77 (11/13 2044) Pulse Rate: 72 (11/13 2044)  Labs: Recent Labs    12/28/20 0958 12/28/20 1646 12/29/20 0511 12/29/20 1651 12/30/20 0157 12/30/20 0548 12/30/20 0743 12/30/20 1008 12/30/20 2001  HGB 11.7*  --  10.2*  --  9.9*  --   --   --   --   HCT 37.0  --  32.0*  --  30.6*  --   --   --   --   PLT 201  --  177  --  171  --   --   --   --   APTT 31  --   --   --   --   --   --   --   --   LABPROT 13.4  --  14.4  --   --   --   --   --   --   INR 1.0  --  1.1  --   --   --   --   --   --   HEPARINUNFRC  --   --   --    < > 0.50  --   --  0.20* 0.33  CREATININE 1.23*  --  0.91  --   --   --   --   --   --   TROPONINIHS  --    < > 13,125*  --   --  4,072* 4,442*  --   --    < > = values in this interval not displayed.     Estimated Creatinine Clearance: 46.6 mL/min (by C-G formula based on SCr of 0.91 mg/dL).   Medical History: Past Medical History:  Diagnosis Date   Arthritis    AV block, Mobitz II    a. 09/2016 s/p MDT 10/2016 Azure XT DR MRI DC PPM   CAD (coronary artery disease)    a. 11/2020 NSTEMI/Cath: LM mild diff dzs, LAD, diff dzs, LCX 30d, RCA Ca2+ w/ diff dzs throughout, 72m. FFR cath not able to advance-->Med Rx.   Carotid artery disease ([name])    a. 09/2016 Carotid U/S: RICA 70-99%, LICA 50-69%;  b. 09/2016 CTA Neck: RICA 60, RCCA 30, LICA 70%, L Vert 60, L Basilar 70; c. 10/2019 s/p R CEA.   Essential hypertension    History of stress test    a. 12/2016 MV: EF 51%, no ischemia/infarct; b. 10/2019 MV: EF 61%, no ischemia/infarct.   Hyperlipidemia    Hypertrophic cardiomyopathy ([name])    a. 09/2016 Echo: EF 60-65%, no rwma, Gr1 DD, Ca2+ MV annulus; b. 11/2017 Echo: EF 70-75%, no  rwma, Gr1 DD, mild AS vs dynamic LVOT obs. Nl RV fxn. Hyperdynamic LVEF w/ sev LVH; c. 10/2019 Echo: EF 70-75%, no rwma, gr1 DD, sev LVH, Gr1 DD, nl PASP, triv MR, mild-mod Ao sclerosis; c. 05/2020 Echo: EF 60-65%; e. 11/2020 Echo: EF 60-65%, sev LVH, nl RV fxn, Mild MR. Ao Sclerosis.   Presence of permanent cardiac pacemaker     Medications:  Scheduled:   aspirin EC  81 mg Oral Daily   clopidogrel  75 mg Oral Daily   dapagliflozin propanediol  10 mg Oral Daily   Finerenone  20 mg Oral Daily  fluticasone furoate-vilanterol  1 puff Inhalation Daily   And   umeclidinium bromide  1 puff Inhalation Daily   metoprolol succinate  25 mg Oral Daily   mirtazapine  7.5 mg Oral QHS   polyethylene glycol  17 g Oral Daily   rosuvastatin  40 mg Oral Daily   Vibegron  75 mg Oral Daily    Assessment: 75 y.o. female with a hx of hypertension, HCM, recent STEMI, former smoker presented with hypotension, AMS and sepsis in the ED. She is not on a DOAC prior to admission based on chart review. H&H, platelets noted to be trending down slightly prior to starting heparin. Pharmacy is asked to monitor and adjust this heparin infusion that is being started due to elevated troponins/ACS  Date/time HL Comments 11/12 1651  0.82  11/13 0157  0.50, therapeutic X 1  11/13 1008  0.20  subtherabolus/inc to 900 u/hr 11/14 2001 0.33 therapeutic x 1  Goal of Therapy:  Heparin level 0.3-0.7 units/ml Monitor platelets by anticoagulation protocol: Yes   Plan:  HL therapeutic x 1 on current arte Will continue heparin drip at 900 units/hr Recheck HL in 8 hrs Per Cardiology MD note 11/13: IV heparin for 24 more hours  CBC daily  Danyael Alipio Rodriguez-Guzman PharmD, BCPS 12/30/2020 9:18 PM

## 2020-12-30 NOTE — Progress Notes (Signed)
Patient ID: ELYNN BERTCH, female   DOB: [birth year], 75 y.o.   MRN: [id number] [facility] PROGRESS NOTE  ALLISE ARIEL [id number] DOB: 1945-10-03 DOA: 12/28/2020 PCP: Remi Haggard, FNP  HPI/Subjective: Patient feeling a little bit better today.  Initially admitted with altered mental status and hypotension.  Then found to have NSTEMI with troponin going above 13,000.  Objective: Vitals:   12/30/20 0815 12/30/20 1100  BP: (!) 163/81 (!) 163/70  Pulse: 70 70  Resp: 20 17  Temp: 98.7 F (37.1 C) 98.7 F (37.1 C)  SpO2: 96% 96%    Intake/Output Summary (Last 24 hours) at 12/30/2020 1216 Last data filed at 12/29/2020 1700 Gross per 24 hour  Intake 659.5 ml  Output 1400 ml  Net -740.5 ml   Filed Weights   12/28/20 0947 12/29/20 1700  Weight: 63.5 kg 63.6 kg    ROS: Review of Systems  Respiratory:  Negative for shortness of breath.   Cardiovascular:  Negative for chest pain.  Gastrointestinal:  Negative for abdominal pain, nausea and vomiting.  Exam: Physical Exam HENT:     Head: Normocephalic.     Mouth/Throat:     Pharynx: No oropharyngeal exudate.  Eyes:     General: Lids are normal.     Conjunctiva/sclera: Conjunctivae normal.     Pupils: Pupils are equal, round, and reactive to light.  Cardiovascular:     Rate and Rhythm: Normal rate and regular rhythm.     Heart sounds: Normal heart sounds, S1 normal and S2 normal.  Pulmonary:     Breath sounds: No decreased breath sounds, wheezing, rhonchi or rales.  Abdominal:     Palpations: Abdomen is soft.     Tenderness: There is no abdominal tenderness.  Musculoskeletal:     Right lower leg: No swelling.     Left lower leg: No swelling.  Skin:    General: Skin is warm.     Findings: No rash.  Neurological:     Mental Status: She is alert.     Comments: Patient answers all questions appropriately.  Able to straight leg raise.      Scheduled Meds:  aspirin EC  81 mg Oral Daily   clopidogrel   75 mg Oral Daily   dapagliflozin propanediol  10 mg Oral Daily   Finerenone  20 mg Oral Daily   fluticasone furoate-vilanterol  1 puff Inhalation Daily   And   umeclidinium bromide  1 puff Inhalation Daily   metoprolol succinate  25 mg Oral Daily   mirtazapine  7.5 mg Oral QHS   polyethylene glycol  17 g Oral Daily   rosuvastatin  40 mg Oral Daily   Vibegron  75 mg Oral Daily   Continuous Infusions:  sodium chloride 30 mL/hr at 12/29/20 1438   heparin 900 Units/hr (12/30/20 1143)    Assessment/Plan:  NSTEMI.  Troponin peaked at 13,125.  Today's troponin's [id number].  Continue heparin drip.  Patient on aspirin, Plavix, Toprol and Crestor.  [facility] cardiology consultation.  Patient had a recent cardiac cath and was recommended medical management at that time.  Cardiology will review cardiac cath to determine whether the blockages amenable to a procedure or not. Hypotension initially.  Improved with gentle IV fluid hydration.  Can go back on Toprol-XL SIRS.  Sepsis ruled out.  Cultures are negative.  I discontinued antibiotics with procalcitonin negative. COPD unspecified on Trelegy inhaler Type 2 diabetes mellitus with hyperlipidemia.  Continue Crestor.  Hemoglobin A1c 6.7.  Diet controlled.  On Farxiga Acute kidney injury on chronic kidney stage II with dehydration.  Creatinine 1.23 on presentation dropped down to 0.91 with fluids. No atrial fibrillation when pacemaker interrogated by cardiology History of Mobitz type II block with pacemaker History of hypertrophic cardiomyopathy Weakness.  Will need PT to see after cardiology clearance to walk        Code Status:     Code Status Orders  (From admission, onward)           Start     Ordered   12/28/20 1715  Full code  Continuous        12/28/20 1716           Code Status History     Date Active Date Inactive Code Status Order ID Comments User Context   12/12/2020 1427 12/17/2020 1641 Full Code [id number]   Swayze, Ava, DO ED   05/18/2020 1758 05/21/2020 1627 Full Code [id number]  Lucile Shutters, MD ED   11/16/2019 1803 11/18/2019 1944 Full Code [id number]  Schnier, Latina Craver, MD Inpatient   11/06/2019 2041 11/08/2019 2138 Full Code [id number]  Charlsie Quest, MD ED   04/24/2018 1837 04/26/2018 2027 Full Code [id number]  Ihor Austin, MD Inpatient   11/17/2017 1653 11/20/2017 1514 Full Code [id number]  Milagros Loll, MD ED   10/05/2016 1444 10/10/2016 0233 Full Code [id number]  Jodelle Gross, NP Inpatient   10/03/2016 1820 10/05/2016 1342 Full Code [id number]  Enid Baas, MD ED      Family Communication: Declined Disposition Plan: Status is: Inpatient.  Cardiology will have to decide what they want to do.  Consultants: Cardiology  Time spent: 27 minutes  Dayrin Stallone [employer]
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
## 2020-12-31 ENCOUNTER — Encounter
Admission: EM | Disposition: A | Discharge status: Home Health | Payer: Self-pay | Source: Home / Self Care | Attending: Internal Medicine

## 2020-12-31 DIAGNOSIS — R531 Weakness: Secondary | ICD-10-CM

## 2020-12-31 DIAGNOSIS — I498 Other specified cardiac arrhythmias: Secondary | ICD-10-CM | POA: Diagnosis not present

## 2020-12-31 DIAGNOSIS — I2510 Atherosclerotic heart disease of native coronary artery without angina pectoris: Secondary | ICD-10-CM | POA: Diagnosis not present

## 2020-12-31 DIAGNOSIS — I422 Other hypertrophic cardiomyopathy: Secondary | ICD-10-CM | POA: Diagnosis not present

## 2020-12-31 DIAGNOSIS — I214 Non-ST elevation (NSTEMI) myocardial infarction: Secondary | ICD-10-CM | POA: Diagnosis not present

## 2020-12-31 HISTORY — PX: CORONARY STENT INTERVENTION: CATH118234

## 2020-12-31 HISTORY — PX: LEFT HEART CATH AND CORONARY ANGIOGRAPHY: CATH118249

## 2020-12-31 LAB — BASIC METABOLIC PANEL
Anion gap: 5 (ref 5–15)
BUN: 31 mg/dL — ABNORMAL HIGH (ref 8–23)
CO2: 24 mmol/L (ref 22–32)
Calcium: 8.9 mg/dL (ref 8.9–10.3)
Chloride: 108 mmol/L (ref 98–111)
Creatinine, Ser: 0.93 mg/dL (ref 0.44–1.00)
GFR, Estimated: >60 mL/min (ref >60)
Glucose, Bld: 116 mg/dL — ABNORMAL HIGH (ref 70–99)
Potassium: 3.9 mmol/L (ref 3.5–5.1)
Sodium: 137 mmol/L (ref 135–145)

## 2020-12-31 LAB — HEPARIN LEVEL (UNFRACTIONATED): Heparin Unfractionated: 0.47 IU/mL (ref 0.30–0.70)

## 2020-12-31 LAB — CBC
HCT: 33.7 % — ABNORMAL LOW (ref 36.0–46.0)
Hemoglobin: 10.7 g/dL — ABNORMAL LOW (ref 12.0–15.0)
MCH: 26.6 pg (ref 26.0–34.0)
MCHC: 31.8 g/dL (ref 30.0–36.0)
MCV: 83.6 fL (ref 80.0–100.0)
Platelets: 189 10*3/uL (ref 150–400)
RBC: 4.03 MIL/uL (ref 3.87–5.11)
RDW: 14.2 % (ref 11.5–15.5)
WBC: 4.5 10*3/uL (ref 4.0–10.5)
nRBC: 0 % (ref 0.0–0.2)

## 2020-12-31 LAB — POCT ACTIVATED CLOTTING TIME: Activated Clotting Time: 324 seconds

## 2020-12-31 SURGERY — LEFT HEART CATH AND CORONARY ANGIOGRAPHY
Anesthesia: Moderate Sedation

## 2020-12-31 MED ORDER — MIDAZOLAM HCL 2 MG/2ML IJ SOLN
INTRAMUSCULAR | Status: AC
  Filled 2020-12-31: qty 2

## 2020-12-31 MED ORDER — SODIUM CHLORIDE 0.9% FLUSH
3.0000 mL | Freq: Two times a day (BID) | INTRAVENOUS | Status: DC
  Administered 2021-01-01 – 2021-01-04 (×7): 3 mL via INTRAVENOUS

## 2020-12-31 MED ORDER — ONDANSETRON HCL 4 MG/2ML IJ SOLN: 4.0000 mg | Freq: Four times a day (QID) | INTRAMUSCULAR | Status: DC | PRN

## 2020-12-31 MED ORDER — HEPARIN SODIUM (PORCINE) 1000 UNIT/ML IJ SOLN
INTRAMUSCULAR | Status: DC | PRN
  Administered 2020-12-31 (×2): 3200 [IU] via INTRAVENOUS

## 2020-12-31 MED ORDER — SODIUM CHLORIDE 0.9 % WEIGHT BASED INFUSION
1.0000 mL/kg/h | INTRAVENOUS | Status: AC
  Administered 2020-12-31: 1 mL/kg/h via INTRAVENOUS

## 2020-12-31 MED ORDER — VERAPAMIL HCL 2.5 MG/ML IV SOLN
INTRAVENOUS | Status: AC
  Filled 2020-12-31: qty 2

## 2020-12-31 MED ORDER — VERAPAMIL HCL 2.5 MG/ML IV SOLN
INTRAVENOUS | Status: DC | PRN
  Administered 2020-12-31: 2.5 mg via INTRA_ARTERIAL

## 2020-12-31 MED ORDER — HEPARIN (PORCINE) IN NACL 2000-0.9 UNIT/L-% IV SOLN
INTRAVENOUS | Status: DC | PRN
  Administered 2020-12-31: 1000 mL via INTRACORONARY

## 2020-12-31 MED ORDER — MIDAZOLAM HCL 2 MG/2ML IJ SOLN
INTRAMUSCULAR | Status: DC | PRN
  Administered 2020-12-31: 1 mg via INTRAVENOUS

## 2020-12-31 MED ORDER — SODIUM CHLORIDE 0.9 % IV SOLN: 250.0000 mL | INTRAVENOUS | Status: DC | PRN

## 2020-12-31 MED ORDER — HEPARIN SODIUM (PORCINE) 1000 UNIT/ML IJ SOLN
INTRAMUSCULAR | Status: AC
  Filled 2020-12-31: qty 1

## 2020-12-31 MED ORDER — SODIUM CHLORIDE 0.9 % IV SOLN
INTRAVENOUS | Status: DC
  Administered 2020-12-31: 1000 mL via INTRAVENOUS

## 2020-12-31 MED ORDER — ENOXAPARIN SODIUM 40 MG/0.4ML IJ SOSY
40.0000 mg | PREFILLED_SYRINGE | INTRAMUSCULAR | Status: DC
  Administered 2021-01-01 – 2021-01-03 (×3): 40 mg via SUBCUTANEOUS
  Filled 2020-12-31 (×3): qty 0.4

## 2020-12-31 MED ORDER — LIDOCAINE HCL 1 % IJ SOLN
INTRAMUSCULAR | Status: AC
  Filled 2020-12-31: qty 20

## 2020-12-31 MED ORDER — SODIUM CHLORIDE 0.9% FLUSH: 3.0000 mL | INTRAVENOUS | Status: DC | PRN

## 2020-12-31 MED ORDER — FENTANYL CITRATE (PF) 100 MCG/2ML IJ SOLN
INTRAMUSCULAR | Status: DC | PRN
  Administered 2020-12-31: 25 ug via INTRAVENOUS

## 2020-12-31 MED ORDER — SODIUM CHLORIDE 0.9% FLUSH
3.0000 mL | INTRAVENOUS | Status: DC | PRN
  Administered 2021-01-01: 3 mL via INTRAVENOUS

## 2020-12-31 MED ORDER — SODIUM CHLORIDE 0.9% FLUSH: 3.0000 mL | Freq: Two times a day (BID) | INTRAVENOUS | Status: DC

## 2020-12-31 MED ORDER — IOHEXOL 350 MG/ML SOLN
INTRAVENOUS | Status: DC | PRN
  Administered 2020-12-31: 101 mL via INTRACARDIAC

## 2020-12-31 MED ORDER — FENTANYL CITRATE (PF) 100 MCG/2ML IJ SOLN
INTRAMUSCULAR | Status: AC
  Filled 2020-12-31: qty 2

## 2020-12-31 SURGICAL SUPPLY — 19 items
BALLN EUPHORA RX 2.0X12 (BALLOONS) ×2
BALLOON EUPHORA RX 2.0X12 (BALLOONS) IMPLANT
CATH 5F 110X4 TIG (CATHETERS) ×1 IMPLANT
CATH VISTA GUIDE 6FR JR4 (CATHETERS) ×1 IMPLANT
DEVICE RAD TR BAND REGULAR (VASCULAR PRODUCTS) ×1 IMPLANT
DRAPE BRACHIAL (DRAPES) ×1 IMPLANT
GLIDESHEATH SLEND SS 6F .021 (SHEATH) ×1 IMPLANT
GUIDEWIRE INQWIRE 1.5J.035X260 (WIRE) IMPLANT
INQWIRE 1.5J .035X260CM (WIRE) ×2
KIT ENCORE 26 ADVANTAGE (KITS) ×1 IMPLANT
PACK CARDIAC CATH (CUSTOM PROCEDURE TRAY) ×2 IMPLANT
PROTECTION STATION PRESSURIZED (MISCELLANEOUS) ×2
SET ATX SIMPLICITY (MISCELLANEOUS) ×1 IMPLANT
STATION PROTECTION PRESSURIZED (MISCELLANEOUS) IMPLANT
TUBING CIL FLEX 10 FLL-RA (TUBING) ×1 IMPLANT
WIRE ASAHI FIELDER XT 300CM (WIRE) ×1 IMPLANT
WIRE HI TORQ WHISPER MS 190CM (WIRE) ×1 IMPLANT
WIRE HITORQ VERSACORE ST 145CM (WIRE) ×1 IMPLANT
WIRE RUNTHROUGH .014X180CM (WIRE) ×1 IMPLANT
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
## 2020-12-31 NOTE — Care Management Important Message (Signed)
Important Message  Patient Details  Name: MARAYA GWILLIAM MRN: [id number] Date of Birth: [birth year]   Medicare Important Message Given:  N/A - LOS <3 / Initial given by admissions  VC 11/12 @ 1:01 pm   Olegario Messier A Nichael Ehly 12/31/2020, 11:21 AM

## 2020-12-31 NOTE — Progress Notes (Signed)
ANTICOAGULATION CONSULT NOTE  Pharmacy Consult for heparin monitoring Indication: chest pain/ACS  No Known Allergies  Patient Measurements: Height: 5\' 5"  (165.1 cm) Weight: 63.6 kg (140 lb 3.4 oz) IBW/kg (Calculated) : 57  Vital Signs: Temp: 97.8 F (36.6 C) (11/14 0422) BP: 168/72 (11/14 0422) Pulse Rate: 69 (11/14 0422)  Labs: Recent Labs    12/28/20 0958 12/28/20 1646 12/29/20 0511 12/29/20 1651 12/30/20 0157 12/30/20 0548 12/30/20 0743 12/30/20 1008 12/30/20 2001 12/31/20 0433  HGB 11.7*  --  10.2*  --  9.9*  --   --   --   --  10.7*  HCT 37.0  --  32.0*  --  30.6*  --   --   --   --  33.7*  PLT 201  --  177  --  171  --   --   --   --  189  APTT 31  --   --   --   --   --   --   --   --   --   LABPROT 13.4  --  14.4  --   --   --   --   --   --   --   INR 1.0  --  1.1  --   --   --   --   --   --   --   HEPARINUNFRC  --   --   --    < > 0.50  --   --  0.20* 0.33 0.47  CREATININE 1.23*  --  0.91  --   --   --   --   --   --  0.93  TROPONINIHS  --    < > 13,125*  --   --  4,072* 4,442*  --   --   --    < > = values in this interval not displayed.     Estimated Creatinine Clearance: 45.6 mL/min (by C-G formula based on SCr of 0.93 mg/dL).   Medical History: Past Medical History:  Diagnosis Date   Arthritis    AV block, Mobitz II    a. 09/2016 s/p MDT 10/2016 Azure XT DR MRI DC PPM   CAD (coronary artery disease)    a. 11/2020 NSTEMI/Cath: LM mild diff dzs, LAD, diff dzs, LCX 30d, RCA Ca2+ w/ diff dzs throughout, 18m. FFR cath not able to advance-->Med Rx.   Carotid artery disease ([name])    a. 09/2016 Carotid U/S: RICA 70-99%, LICA 50-69%;  b. 09/2016 CTA Neck: RICA 60, RCCA 30, LICA 70%, L Vert 60, L Basilar 70; c. 10/2019 s/p R CEA.   Essential hypertension    History of stress test    a. 12/2016 MV: EF 51%, no ischemia/infarct; b. 10/2019 MV: EF 61%, no ischemia/infarct.   Hyperlipidemia    Hypertrophic cardiomyopathy ([name])    a. 09/2016 Echo: EF 60-65%, no  rwma, Gr1 DD, Ca2+ MV annulus; b. 11/2017 Echo: EF 70-75%, no rwma, Gr1 DD, mild AS vs dynamic LVOT obs. Nl RV fxn. Hyperdynamic LVEF w/ sev LVH; c. 10/2019 Echo: EF 70-75%, no rwma, gr1 DD, sev LVH, Gr1 DD, nl PASP, triv MR, mild-mod Ao sclerosis; c. 05/2020 Echo: EF 60-65%; e. 11/2020 Echo: EF 60-65%, sev LVH, nl RV fxn, Mild MR. Ao Sclerosis.   Presence of permanent cardiac pacemaker     Medications:  Scheduled:   aspirin EC  81 mg Oral Daily   clopidogrel  75 mg Oral Daily  dapagliflozin propanediol  10 mg Oral Daily   Finerenone  20 mg Oral Daily   fluticasone furoate-vilanterol  1 puff Inhalation Daily   And   umeclidinium bromide  1 puff Inhalation Daily   metoprolol succinate  25 mg Oral Daily   mirtazapine  7.5 mg Oral QHS   polyethylene glycol  17 g Oral Daily   rosuvastatin  40 mg Oral Daily   Vibegron  75 mg Oral Daily    Assessment: 75 y.o. female with a hx of hypertension, HCM, recent STEMI, former smoker presented with hypotension, AMS and sepsis in the ED. She is not on a DOAC prior to admission based on chart review. H&H, platelets noted to be trending down slightly prior to starting heparin. Pharmacy is asked to monitor and adjust this heparin infusion that is being started due to elevated troponins/ACS  Date/time HL Comments 11/12 1651  0.82  11/13 0157  0.50, therapeutic X 1  11/13 1008  0.20  subtherabolus/inc to 900 u/hr 11/13 2001 0.33 therapeutic x 1 11/14 0433     0.47 therapeutic X 2   Goal of Therapy:  Heparin level 0.3-0.7 units/ml Monitor platelets by anticoagulation protocol: Yes   Plan:  11/14: H @ 0433 = 0.47, therapeutic X 2 Will continue pt on current rate and recheck HL on 11/15 with AM labs.   Lafreda Casebeer D 12/31/2020 5:56 AM

## 2020-12-31 NOTE — Progress Notes (Signed)
Patient ID: Shannon Chen, female   DOB: [birth year], 75 y.o.   MRN: [id number] [facility] PROGRESS NOTE  Shannon Chen [id number] DOB: Oct 13, 1945 DOA: 12/28/2020 PCP: Armando Gang, FNP  HPI/Subjective: Patient seen this morning and was feeling okay.  She was on heparin drip for NSTEMI.  No complaints of chest pain or shortness of breath this morning but still feeling weak.  Objective: Vitals:   12/31/20 0757 12/31/20 1450  BP: (!) 174/64 (!) 152/64  Pulse: 65 67  Resp: 17 15  Temp: 98.7 F (37.1 C) 98.5 F (36.9 C)  SpO2: 96% 94%    Intake/Output Summary (Last 24 hours) at 12/31/2020 1524 Last data filed at 12/31/2020 1100 Gross per 24 hour  Intake 640 ml  Output 1151 ml  Net -511 ml   Filed Weights   12/28/20 0947 12/29/20 1700 12/31/20 1450  Weight: 63.5 kg 63.6 kg 65.3 kg    ROS: Review of Systems  Respiratory:  Negative for shortness of breath.   Cardiovascular:  Negative for chest pain.  Gastrointestinal:  Negative for abdominal pain, nausea and vomiting.  Exam: Physical Exam HENT:     Head: Normocephalic.     Mouth/Throat:     Pharynx: No oropharyngeal exudate.  Eyes:     General: Lids are normal.     Conjunctiva/sclera: Conjunctivae normal.  Cardiovascular:     Rate and Rhythm: Normal rate and regular rhythm.     Heart sounds: Normal heart sounds, S1 normal and S2 normal.  Pulmonary:     Breath sounds: No decreased breath sounds, wheezing, rhonchi or rales.  Abdominal:     Palpations: Abdomen is soft.     Tenderness: There is no abdominal tenderness.  Musculoskeletal:     Right lower leg: No swelling.     Left lower leg: No swelling.  Skin:    General: Skin is warm.     Findings: No rash.  Neurological:     Mental Status: She is alert.     Comments: Answers all questions appropriately      Scheduled Meds:  [MAR Hold] aspirin EC  81 mg Oral Daily   [MAR Hold] clopidogrel  75 mg Oral Daily   [MAR Hold] dapagliflozin  propanediol  10 mg Oral Daily   [MAR Hold] Finerenone  20 mg Oral Daily   [MAR Hold] fluticasone furoate-vilanterol  1 puff Inhalation Daily   And   [MAR Hold] umeclidinium bromide  1 puff Inhalation Daily   [MAR Hold] metoprolol succinate  25 mg Oral Daily   [MAR Hold] mirtazapine  7.5 mg Oral QHS   [MAR Hold] polyethylene glycol  17 g Oral Daily   [MAR Hold] rosuvastatin  40 mg Oral Daily   sodium chloride flush  3 mL Intravenous Q12H   [MAR Hold] Vibegron  75 mg Oral Daily   Continuous Infusions:  sodium chloride     sodium chloride 1,000 mL (12/31/20 1448)   heparin Stopped (12/31/20 1447)    Assessment/Plan:  NSTEMI.  Troponin peaked at 13,125.  Case discussed with Dr. Kirke Corin cardiology and he will bring to the cardiac Cath Lab to see if her blockage is amenable to stenting procedure.  Continue aspirin, Plavix, Toprol, Crestor and heparin drip. Hypotension initially.  Improved with gentle IV fluid hydration.  Continue Toprol lower dose. SIRS.  Sepsis ruled out.  Cultures negative.  Antibiotics discontinued since procalcitonin negative. COPD unspecified on Trelegy inhaler Type 2 diabetes mellitus with hyperlipidemia.  Continue Crestor.  Hemoglobin A1c 6.7.  Diet controlled and on Farxiga Acute kidney injury on chronic kidney disease stage II.  Creatinine 1.23 on presentation and 0.93 today. No atrial fibrillation when pacemaker interrogated by cardiology History of Mobitz type II block with pacemaker Hypertrophic cardiomyopathy Weakness.  Physical therapy evaluation after cardiology procedure likely for tomorrow.     Code Status:     Code Status Orders  (From admission, onward)           Start     Ordered   12/28/20 1715  Full code  Continuous        12/28/20 1716           Code Status History     Date Active Date Inactive Code Status Order ID Comments User Context   12/12/2020 1427 12/17/2020 1641 Full Code [id number]  Swayze, Ava, DO ED   05/18/2020 1758  05/21/2020 1627 Full Code [id number]  Collier Bullock, MD ED   11/16/2019 1803 11/18/2019 1944 Full Code [id number]  Schnier, Dolores Lory, MD Inpatient   11/06/2019 2041 11/08/2019 2138 Full Code [id number]  Lenore Cordia, MD ED   04/24/2018 1837 04/26/2018 2027 Full Code [id number]  Saundra Shelling, MD Inpatient   11/17/2017 1653 11/20/2017 1514 Full Code [id number]  Hillary Bow, MD ED   10/05/2016 1444 10/10/2016 0233 Full Code [id number]  Lendon Colonel, NP Inpatient   10/03/2016 1820 10/05/2016 1342 Full Code [id number]  Gladstone Lighter, MD ED      Family Communication: Declined Disposition Plan: Status is: Inpatient  Consultants: Cardiology  Time spent: 28 minutes  [place]

## 2021-01-01 ENCOUNTER — Encounter: Payer: Self-pay | Admitting: Cardiovascular Disease

## 2021-01-01 DIAGNOSIS — I422 Other hypertrophic cardiomyopathy: Secondary | ICD-10-CM | POA: Diagnosis not present

## 2021-01-01 DIAGNOSIS — M25561 Pain in right knee: Secondary | ICD-10-CM

## 2021-01-01 DIAGNOSIS — Z95818 Presence of other cardiac implants and grafts: Secondary | ICD-10-CM

## 2021-01-01 DIAGNOSIS — I5033 Acute on chronic diastolic (congestive) heart failure: Secondary | ICD-10-CM | POA: Diagnosis not present

## 2021-01-01 DIAGNOSIS — I441 Atrioventricular block, second degree: Secondary | ICD-10-CM

## 2021-01-01 DIAGNOSIS — I959 Hypotension, unspecified: Secondary | ICD-10-CM

## 2021-01-01 DIAGNOSIS — Z7902 Long term (current) use of antithrombotics/antiplatelets: Secondary | ICD-10-CM

## 2021-01-01 DIAGNOSIS — E278 Other specified disorders of adrenal gland: Secondary | ICD-10-CM

## 2021-01-01 DIAGNOSIS — Z7982 Long term (current) use of aspirin: Secondary | ICD-10-CM

## 2021-01-01 DIAGNOSIS — R6510 Systemic inflammatory response syndrome (SIRS) of non-infectious origin without acute organ dysfunction: Secondary | ICD-10-CM

## 2021-01-01 DIAGNOSIS — R531 Weakness: Secondary | ICD-10-CM

## 2021-01-01 DIAGNOSIS — I214 Non-ST elevation (NSTEMI) myocardial infarction: Secondary | ICD-10-CM | POA: Diagnosis not present

## 2021-01-01 DIAGNOSIS — M25562 Pain in left knee: Secondary | ICD-10-CM

## 2021-01-01 LAB — CBC
HCT: 34.1 % — ABNORMAL LOW (ref 36.0–46.0)
Hemoglobin: 10.4 g/dL — ABNORMAL LOW (ref 12.0–15.0)
MCH: 26.1 pg (ref 26.0–34.0)
MCHC: 30.5 g/dL (ref 30.0–36.0)
MCV: 85.5 fL (ref 80.0–100.0)
Platelets: 182 10*3/uL (ref 150–400)
RBC: 3.99 MIL/uL (ref 3.87–5.11)
RDW: 14.4 % (ref 11.5–15.5)
WBC: 4.7 10*3/uL (ref 4.0–10.5)
nRBC: 0 % (ref 0.0–0.2)

## 2021-01-01 LAB — BASIC METABOLIC PANEL
Anion gap: 6 (ref 5–15)
BUN: 29 mg/dL — ABNORMAL HIGH (ref 8–23)
CO2: 24 mmol/L (ref 22–32)
Calcium: 8.7 mg/dL — ABNORMAL LOW (ref 8.9–10.3)
Chloride: 110 mmol/L (ref 98–111)
Creatinine, Ser: 0.98 mg/dL (ref 0.44–1.00)
GFR, Estimated: 59 mL/min — ABNORMAL LOW (ref >60)
Glucose, Bld: 108 mg/dL — ABNORMAL HIGH (ref 70–99)
Potassium: 4.3 mmol/L (ref 3.5–5.1)
Sodium: 140 mmol/L (ref 135–145)

## 2021-01-01 LAB — URIC ACID: Uric Acid, Serum: 3.9 mg/dL (ref 2.5–7.1)

## 2021-01-01 MED ORDER — METOPROLOL SUCCINATE ER 25 MG PO TB24
25.0000 mg | ORAL_TABLET | Freq: Once | ORAL | Status: AC
  Administered 2021-01-01: 25 mg via ORAL
  Filled 2021-01-01: qty 1

## 2021-01-01 MED ORDER — BISACODYL 5 MG PO TBEC
5.0000 mg | DELAYED_RELEASE_TABLET | Freq: Once | ORAL | Status: AC
  Administered 2021-01-01: 5 mg via ORAL
  Filled 2021-01-01: qty 1

## 2021-01-01 MED ORDER — OXYCODONE HCL 5 MG PO TABS
5.0000 mg | ORAL_TABLET | ORAL | Status: DC | PRN
  Administered 2021-01-01 – 2021-01-03 (×4): 5 mg via ORAL
  Filled 2021-01-01 (×4): qty 1

## 2021-01-01 MED ORDER — COSYNTROPIN 0.25 MG IJ SOLR
0.2500 mg | Freq: Once | INTRAMUSCULAR | Status: AC
  Administered 2021-01-02: 0.25 mg via INTRAVENOUS
  Filled 2021-01-01 (×2): qty 0.25

## 2021-01-01 MED ORDER — DICLOFENAC SODIUM 1 % EX GEL
4.0000 g | Freq: Four times a day (QID) | CUTANEOUS | Status: DC
  Administered 2021-01-01 – 2021-01-04 (×7): 4 g via TOPICAL
  Filled 2021-01-01: qty 100

## 2021-01-01 NOTE — Progress Notes (Signed)
Patient ID: Shannon Chen, female   DOB: [birth year], 75 y.o.   MRN: [id number] [facility] PROGRESS NOTE  Shannon Chen FGH:[id number] DOB: 1945/04/29 DOA: 12/28/2020 PCP: Remi Haggard, FNP  HPI/Subjective: Patient still not feeling well but cannot describe how she is feeling.  No complaints of chest pain or shortness of breath.  She does have pain in the knees.  She has not walked since coming into the hospital.  Objective: Vitals:   01/01/21 0808 01/01/21 1100  BP: 138/60 138/60  Pulse: 71 71  Resp: 20 20  Temp: 98.3 F (36.8 C) 98.3 F (36.8 C)  SpO2: 95% 95%    Intake/Output Summary (Last 24 hours) at 01/01/2021 1346 Last data filed at 01/01/2021 1100 Gross per 24 hour  Intake 480 ml  Output 1000 ml  Net -520 ml   Filed Weights   12/28/20 0947 12/29/20 1700 12/31/20 1450  Weight: 63.5 kg 63.6 kg 65.3 kg    ROS: Review of Systems  Respiratory:  Negative for shortness of breath.   Cardiovascular:  Negative for chest pain.  Gastrointestinal:  Negative for abdominal pain, nausea and vomiting.  Musculoskeletal:  Positive for joint pain.  Exam: Physical Exam HENT:     Head: Normocephalic.     Mouth/Throat:     Pharynx: No oropharyngeal exudate.  Eyes:     General: Lids are normal.     Conjunctiva/sclera: Conjunctivae normal.  Cardiovascular:     Rate and Rhythm: Normal rate and regular rhythm.     Heart sounds: Normal heart sounds, S1 normal and S2 normal.  Pulmonary:     Breath sounds: Normal breath sounds. No decreased breath sounds, wheezing, rhonchi or rales.  Abdominal:     Palpations: Abdomen is soft.     Tenderness: There is no abdominal tenderness.  Musculoskeletal:     Right knee: Decreased range of motion. Tenderness present.     Left knee: Decreased range of motion. Tenderness present.     Right lower leg: No swelling.     Left lower leg: No swelling.  Skin:    General: Skin is warm.     Findings: No rash.  Neurological:     Mental  Status: She is alert and oriented to person, place, and time.      Scheduled Meds:  aspirin EC  81 mg Oral Daily   clopidogrel  75 mg Oral Daily   [START ON 01/02/2021] cosyntropin  0.25 mg Intravenous Once   dapagliflozin propanediol  10 mg Oral Daily   diclofenac Sodium  4 g Topical QID   enoxaparin (LOVENOX) injection  40 mg Subcutaneous Q24H   Finerenone  20 mg Oral Daily   fluticasone furoate-vilanterol  1 puff Inhalation Daily   And   umeclidinium bromide  1 puff Inhalation Daily   metoprolol succinate  25 mg Oral Daily   mirtazapine  7.5 mg Oral QHS   polyethylene glycol  17 g Oral Daily   rosuvastatin  40 mg Oral Daily   sodium chloride flush  3 mL Intravenous Q12H   Vibegron  75 mg Oral Daily   Continuous Infusions:  sodium chloride     Brief history.  75 year old female with past medical history of recent NSTEMI treated medically, Mobitz type II AV block status post pacemaker, hyperlipidemia, hypertrophic cardiomyopathy.  She initially was admitted with systemic inflammatory response syndrome but antibiotics were stopped secondary to cultures being negative.  Her first troponin was 105 which was thought to be  coming down from her previous NSTEMI.  The following day her troponin was 13,125.  Patient was started on heparin drip over the weekend.  Repeat cardiac cath that showed mid RCA lesion 90% that was not amenable for PCI secondary to inability to cross the stenosis with a wire.  Medical management recommended.  The patient has not walked since coming into the hospital.  Now complaining of bilateral knee pain.  Assessment/Plan:  NSTEMI.  Troponin peaked at 13,125.  The patient received 48 hours of heparin drip.  The 90% stenosis of the RCA was not amenable for PCI secondary to inability to cross the stenosis with a guidewire.  Medical management with aspirin, Plavix, Toprol, Crestor.  Need to see if she has any further symptoms with working with PT. Hypotension initially  improved with IV fluids SIRS.  Sepsis ruled out.  Cultures negative.  Antibiotics discontinued since procalcitonin negative Weakness and bilateral knee pain.  Uric acid normal range.  We will try diclofenac gel to knees.  Physical therapy evaluation.  Send off ANA, CPK, ESR and rheumatoid factor tomorrow morning. Type 2 diabetes mellitus with hyperlipidemia.  Continue Crestor.  Hemoglobin A1c 6.7.  Continue Farxiga and diet control. Acute kidney injury on chronic kidney disease stage IIIa.  Creatinine 1.23 on presentation.  Creatinine was as low 0.93.  Creatinine 0.98 today Hypertrophic cardiomyopathy Mobitz type II block with pacemaker Borderline low cortisol level.  We will get cosyntropin test tomorrow morning.    Code Status:     Code Status Orders  (From admission, onward)           Start     Ordered   12/28/20 1715  Full code  Continuous        12/28/20 1716           Code Status History     Date Active Date Inactive Code Status Order ID Comments User Context   12/12/2020 1427 12/17/2020 1641 Full Code [id number]  Swayze, Ava, DO ED   05/18/2020 1758 05/21/2020 1627 Full Code [id number]  Collier Bullock, MD ED   11/16/2019 1803 11/18/2019 1944 Full Code [id number]  Schnier, Dolores Lory, MD Inpatient   11/06/2019 2041 11/08/2019 2138 Full Code [id number]  Lenore Cordia, MD ED   04/24/2018 1837 04/26/2018 2027 Full Code [id number]  Saundra Shelling, MD Inpatient   11/17/2017 1653 11/20/2017 1514 Full Code [id number]  Hillary Bow, MD ED   10/05/2016 1444 10/10/2016 0233 Full Code [id number]  Lendon Colonel, NP Inpatient   10/03/2016 1820 10/05/2016 1342 Full Code [id number]  Gladstone Lighter, MD ED      Family Communication: Declined Disposition Plan: Status is: Inpatient  Consultants: Cardiology  Time spent: 27 minutes  [place]

## 2021-01-01 NOTE — Evaluation (Signed)
Physical Therapy Evaluation Patient Details Name: ONEDA DUFFETT MRN: [id number] DOB: Jan 09, 1946 Today's Date: 01/01/2021  History of Present Illness  Mercy Leppla  is a 75 y.o. female sent in with altered mental status and found to be hypotensive in the emergency room.  Suspected sepsis protocol given with IV fluids. Urine analysis negative and CT scan of the chest negative for pneumonia.  Troponin initially was mildly elevated but subsequently increased to 13,000. Pt admissed for NSTEMI. Per Cardiology, "discussed with her the indication for repeat cardiac catheterization and attempted high risk PCI of the right coronary artery." Pt admitted for further cardiac evaluation.   Clinical Impression  Pt is a pleasant 75 year old female who presents from group home with NSTEMI and altered mental status. Pt oreineted x 4 throughout evaluation. Pt reports that she has assistance at her group home for dressing, bathing, and IADLs. Pt reports prior to admission, she was ambulating household distances mostly without any assistive device however, intermittently uses a SPC. Upon evaluation, pt requiring modA for bed mobility, minA for sit to stand at bedside with 1 HHA, and ambulated 10 ft with 1 HHA + minA for balance and safety. Pt currently NWB on R UE due to cardiac catheterization. Pt demonstrating decreased muscular strength, endurance, and impaired balance.PT HR at rest was 80 bpm and after ambulation 97 bpm. Anticipating that with further assistive device training, pt will be able to discharge home with assistance from group home and further HHPT. Pt will benefit from further PT services during acute hospitalization to improve overall mobility and reduce fall risk.       Recommendations for follow up therapy are one component of a multi-disciplinary discharge planning process, led by the attending physician.  Recommendations may be updated based on patient status, additional functional criteria and  insurance authorization.  Follow Up Recommendations Home health PT    Assistance Recommended at Discharge Intermittent Supervision/Assistance  Functional Status Assessment Patient has had a recent decline in their functional status and demonstrates the ability to make significant improvements in function in a reasonable and predictable amount of time.  Equipment Recommendations  Other (comment) (Hemiwalker)    Recommendations for Other Services       Precautions / Restrictions Precautions Precautions: Fall Restrictions Weight Bearing Restrictions: Yes RUE Weight Bearing: Non weight bearing      Mobility  Bed Mobility Overal bed mobility: Needs Assistance Bed Mobility: Supine to Sit     Supine to sit: Mod assist     General bed mobility comments: ModA for supien to sit due to inability to utilize R UE. Heavy VCs for management of B LE    Transfers Overall transfer level: Needs assistance Equipment used: 1 person hand held assist Transfers: Sit to/from Stand Sit to Stand: Min assist           General transfer comment: MinA for transfer from lowered bed surface with 1 HHA on L only. Verbal and tactile cues for increased forward lean    Ambulation/Gait Ambulation/Gait assistance: Min assist Gait Distance (Feet): 10 Feet Assistive device: 1 person hand held assist Gait Pattern/deviations: Step-through pattern;Decreased stride length;Trunk flexed;Narrow base of support Gait velocity: decreased     General Gait Details: Slow ambulation and increased unsteadiness requiring minA for safety.  Stairs            Wheelchair Mobility    Modified Rankin (Stroke Patients Only)       Balance Overall balance assessment: Needs assistance Sitting-balance support: Single  extremity supported;Feet supported Sitting balance-Leahy Scale: Fair Sitting balance - Comments: Posterior lean during LE strength testing   Standing balance support: Single extremity  supported;During functional activity;Reliant on assistive device for balance Standing balance-Leahy Scale: Fair Standing balance comment: MinA for safety with static and dynamic balance activities.                             Pertinent Vitals/Pain Pain Assessment: No/denies pain    Home Living Family/patient expects to be discharged to:: Group home   Available Help at Discharge: Available 24 hours/day Type of Home: Group Home Home Access: Stairs to enter Entrance Stairs-Rails: Right Entrance Stairs-Number of Steps: 2-3   Home Layout: One level Home Equipment: Cane - single point Additional Comments: Pt reports that she only intermittently walks with a SPC for household distances. Pt reports that she does not go out much    Prior Function Prior Level of Function : Needs assist             Mobility Comments: Uses SPC vs. No AD at baseline for mobility ADLs Comments: Assistance with dressing and bathing from group home staff. Assistance wtih cooking and cleaning from staff as well     Hand Dominance   Dominant Hand: Right    Extremity/Trunk Assessment        Lower Extremity Assessment Lower Extremity Assessment: Generalized weakness       Communication   Communication: No difficulties  Cognition Arousal/Alertness: Awake/alert Behavior During Therapy: Flat affect Overall Cognitive Status: No family/caregiver present to determine baseline cognitive functioning                                 General Comments: Alert and oriented to self, place, month/year, and situation.        General Comments General comments (skin integrity, edema, etc.): Pt endorsing some dizziness in standing and requesting to sit. Pt BP obtained an read 128/56 mmHg but symptoms resolved quickly    Exercises     Assessment/Plan    PT Assessment Patient needs continued PT services  PT Problem List Decreased strength;Decreased range of motion;Decreased activity  tolerance;Decreased balance;Decreased mobility;Decreased coordination;Decreased cognition;Decreased knowledge of use of DME;Decreased safety awareness;Decreased knowledge of precautions       PT Treatment Interventions DME instruction;Gait training;Stair training;Functional mobility training;Therapeutic activities;Therapeutic exercise;Balance training;Neuromuscular re-education;Patient/family education;Manual techniques;Modalities    PT Goals (Current goals can be found in the Care Plan section)  Acute Rehab PT Goals Patient Stated Goal: feel better PT Goal Formulation: With patient Time For Goal Achievement: 01/15/21 Potential to Achieve Goals: Good    Frequency Min 2X/week   Barriers to discharge        Co-evaluation               AM-PAC PT "6 Clicks" Mobility  Outcome Measure Help needed turning from your back to your side while in a flat bed without using bedrails?: A Little Help needed moving from lying on your back to sitting on the side of a flat bed without using bedrails?: A Lot Help needed moving to and from a bed to a chair (including a wheelchair)?: A Little Help needed standing up from a chair using your arms (e.g., wheelchair or bedside chair)?: A Little Help needed to walk in hospital room?: A Little Help needed climbing 3-5 steps with a railing? : A Lot 6 Click Score:  16    End of Session Equipment Utilized During Treatment: Gait belt Activity Tolerance: Patient limited by fatigue Patient left: in chair;with call bell/phone within reach;with chair alarm set Nurse Communication: Mobility status PT Visit Diagnosis: Unsteadiness on feet (R26.81);Difficulty in walking, not elsewhere classified (R26.2);Muscle weakness (generalized) (M62.81)    Time: 2924-4628 PT Time Calculation (min) (ACUTE ONLY): 28 min   Charges:   PT Evaluation $PT Eval Low Complexity: 1 Low PT Treatments $Gait Training: 8-22 mins        Verl Blalock, SPT   Verl Blalock 01/01/2021, 4:58 PM

## 2021-01-01 NOTE — Progress Notes (Signed)
Doctor added a dose of Bisacodyl to her med list. Administered. Will look out to see if bowel movement occurs today.

## 2021-01-01 NOTE — Progress Notes (Addendum)
Patient complained of a head ache this morning 7/10. Tylenol PRN given. See eMAR.  Lovenox given this morning as ordered.  Also, patient mentioned not having had a bowel movement in at least 5 days. No previous documentation seen. Miralax given.

## 2021-01-01 NOTE — Progress Notes (Signed)
Progress Note  Patient Name: Shannon Chen Date of Encounter: 01/01/2021  [facility] Cardiologist: Ida Rogue, MD   Subjective   No chest pain, shortness breath, palpitations, or lightheadedness reported.  Patient complains of arthritic pain in both hips extending to the knees.  Inpatient Medications    Scheduled Meds:  aspirin EC  81 mg Oral Daily   clopidogrel  75 mg Oral Daily   [START ON 01/02/2021] cosyntropin  0.25 mg Intravenous Once   dapagliflozin propanediol  10 mg Oral Daily   enoxaparin (LOVENOX) injection  40 mg Subcutaneous Q24H   Finerenone  20 mg Oral Daily   fluticasone furoate-vilanterol  1 puff Inhalation Daily   And   umeclidinium bromide  1 puff Inhalation Daily   metoprolol succinate  25 mg Oral Daily   mirtazapine  7.5 mg Oral QHS   polyethylene glycol  17 g Oral Daily   rosuvastatin  40 mg Oral Daily   sodium chloride flush  3 mL Intravenous Q12H   Vibegron  75 mg Oral Daily   Continuous Infusions:  sodium chloride     PRN Meds: sodium chloride, acetaminophen, albuterol, hydrALAZINE, ondansetron (ZOFRAN) IV, sodium chloride flush   Vital Signs    Vitals:   01/01/21 0510 01/01/21 0800 01/01/21 0808 01/01/21 1100  BP: 131/63  138/60 138/60  Pulse: 69  71 71  Resp: 16  20 20   Temp: 97.9 F (36.6 C)  98.3 F (36.8 C) 98.3 F (36.8 C)  TempSrc:      SpO2: 95% 98% 95% 95%  Weight:      Height:        Intake/Output Summary (Last 24 hours) at 01/01/2021 1312 Last data filed at 01/01/2021 1100 Gross per 24 hour  Intake 480 ml  Output 1000 ml  Net -520 ml   Last 3 Weights 12/31/2020 12/29/2020 12/28/2020  Weight (lbs) 144 lb 140 lb 3.4 oz 139 lb 15.9 oz  Weight (kg) 65.318 kg 63.6 kg 63.5 kg      Telemetry    Normal sinus rhythm - Personally Reviewed  ECG    Normal sinus rhythm with right bundle branch block and anterolateral and inferior T wave inversions. - Personally Reviewed  Physical Exam   GEN: No acute  distress.   Neck: JVP 6-8 cm. Cardiac: RRR, no murmurs, rubs, or gallops.  Respiratory: Regular rate and rhythm with 2/6 systolic murmur. GI: Soft, nontender, non-distended  MS: Trace pretibial edema; No deformity. Neuro:  Nonfocal  Psych: Normal affect   Labs    High Sensitivity Troponin:   Recent Labs  Lab 12/13/20 0536 12/28/20 1646 12/29/20 0511 12/30/20 0548 12/30/20 0743  TROPONINIHS 1,344* 105* 13,125* 4,072* 4,442*     Chemistry Recent Labs  Lab 12/28/20 0958 12/29/20 0511 12/31/20 0433 01/01/21 0505  NA 139 143 137 140  K 3.5 4.1 3.9 4.3  CL 108 111 108 110  CO2 23 26 24 24   GLUCOSE 185* 102* 116* 108*  BUN 35* 23 31* 29*  CREATININE 1.23* 0.91 0.93 0.98  CALCIUM 9.3 9.4 8.9 8.7*  PROT 7.3  --   --   --   ALBUMIN 3.6  --   --   --   AST 31  --   --   --   ALT 40  --   --   --   ALKPHOS 56  --   --   --   BILITOT 1.2  --   --   --  GFRNONAA 45* >60 >60 59*  ANIONGAP 8 6 5 6     Lipids  Recent Labs  Lab 12/30/20 0157  CHOL 121  TRIG 34  HDL 57  LDLCALC 57  CHOLHDL 2.1    Hematology Recent Labs  Lab 12/30/20 0157 12/31/20 0433 01/01/21 0505  WBC 4.4 4.5 4.7  RBC 3.62* 4.03 3.99  HGB 9.9* 10.7* 10.4*  HCT 30.6* 33.7* 34.1*  MCV 84.5 83.6 85.5  MCH 27.3 26.6 26.1  MCHC 32.4 31.8 30.5  RDW 14.4 14.2 14.4  PLT 171 189 182   Thyroid No results for input(s): TSH, FREET4 in the last 168 hours.  BNPNo results for input(s): BNP, PROBNP in the last 168 hours.  DDimer No results for input(s): DDIMER in the last 168 hours.   Radiology    CARDIAC CATHETERIZATION  Result Date: 12/31/2020   Dist Cx lesion is 30% stenosed.   1st Mrg lesion is 60% stenosed.   Prox LAD lesion is 50% stenosed.   Prox RCA lesion is 60% stenosed.   Mid RCA lesion is 90% stenosed. 1.  Severe one-vessel coronary artery disease involving the mid right coronary artery.  The right coronary artery is severely tortuous and calcified.  No significant change since recent  cardiac catheterization. 2.  Left ventricular angiography was not performed.  EF was normal by echo.  Mildly elevated left ventricular Jina Olenick-diastolic pressure. 3.  Attempted unsuccessful angioplasty of the right coronary artery due to inability to cross the stenosis with a wire in spite of multiple attempts. Recommendations: No options for PCI of the right coronary artery.  Recommend continuing medical therapy as is being done and maximizing antianginal medications.   ECHOCARDIOGRAM LIMITED  Result Date: 12/30/2020    ECHOCARDIOGRAM LIMITED REPORT   Patient Name:   Shannon Chen Date of Exam: 12/30/2020 Medical Rec #:  [id number]       Height:       65.0 in Accession #:    [id number]      Weight:       140.2 lb Date of Birth:  Apr 02, 1945       BSA:          1.701 m Patient Age:    75 years        BP:           103/47 mmHg Patient Gender: F               HR:           81 bpm. Exam Location:  [facility] Procedure: 2D Echo, Cardiac Doppler and Color Doppler Indications:     NSTEMI I21.4  History:         Patient has prior history of Echocardiogram examinations.                  Cardiomyopathy; CAD.  Sonographer:     Alyse Low Roar Referring Phys:  [id number] RONALD BERGE Diagnosing Phys: Fransico Him MD IMPRESSIONS  1. Left ventricular ejection fraction, by estimation, is 60 to 65%. The left ventricle has normal function. The left ventricle has no regional wall motion abnormalities. There is severe left ventricular hypertrophy of the basal-septal segment. Left ventricular diastolic parameters are consistent with Grade I diastolic dysfunction (impaired relaxation). Elevated left ventricular Lisha Vitale-diastolic pressure.  2. Right ventricular systolic function is normal. The right ventricular size is normal.  3. The mitral valve is normal in structure. Trivial mitral valve regurgitation. No evidence of mitral stenosis.  4. The  aortic valve is calcified. Aortic valve regurgitation is not visualized. Mild aortic valve  stenosis. Aortic valve area, by VTI measures 1.45 cm. Aortic valve mean gradient measures 11.5 mmHg. Aortic valve Vmax measures 2.45 m/s. FINDINGS  Left Ventricle: Left ventricular ejection fraction, by estimation, is 60 to 65%. The left ventricle has normal function. The left ventricle has no regional wall motion abnormalities. The left ventricular internal cavity size was normal in size. There is  severe left ventricular hypertrophy of the basal-septal segment. Left ventricular diastolic parameters are consistent with Grade I diastolic dysfunction (impaired relaxation). Elevated left ventricular Undrea Shipes-diastolic pressure. Right Ventricle: The right ventricular size is normal. No increase in right ventricular wall thickness. Right ventricular systolic function is normal. Left Atrium: Left atrial size was normal in size. Right Atrium: Right atrial size was normal in size. Pericardium: There is no evidence of pericardial effusion. Mitral Valve: The mitral valve is normal in structure. Mild mitral annular calcification. Trivial mitral valve regurgitation. No evidence of mitral valve stenosis. Tricuspid Valve: The tricuspid valve is normal in structure. Tricuspid valve regurgitation is trivial. No evidence of tricuspid stenosis. Aortic Valve: The aortic valve is calcified. Aortic valve regurgitation is not visualized. Mild aortic stenosis is present. Aortic valve mean gradient measures 11.5 mmHg. Aortic valve peak gradient measures 23.9 mmHg. Aortic valve area, by VTI measures 1.45 cm. Pulmonic Valve: The pulmonic valve was normal in structure. Pulmonic valve regurgitation is trivial. No evidence of pulmonic stenosis. Aorta: The aortic root is normal in size and structure. Venous: The inferior vena cava was not well visualized. IAS/Shunts: The interatrial septum appears to be lipomatous. No atrial level shunt detected by color flow Doppler. Additional Comments: A device lead is visualized. LEFT VENTRICLE PLAX 2D LVIDd:          4.70 cm   Diastology LVIDs:         3.10 cm   LV e' medial:    4.74 cm/s LV PW:         1.20 cm   LV E/e' medial:  15.5 LV IVS:        1.60 cm   LV e' lateral:   3.99 cm/s LVOT diam:     1.70 cm   LV E/e' lateral: 18.5 LV SV:         68 LV SV Index:   40 LVOT Area:     2.27 cm  RIGHT VENTRICLE RV Mid diam:    3.10 cm RV S prime:     11.60 cm/s TAPSE (M-mode): 2.4 cm LEFT ATRIUM             Index        RIGHT ATRIUM           Index LA diam:        3.60 cm 2.12 cm/m   RA Area:     11.90 cm LA Vol (A2C):   49.7 ml 29.22 ml/m  RA Volume:   25.00 ml  14.70 ml/m LA Vol (A4C):   39.4 ml 23.16 ml/m LA Biplane Vol: 44.9 ml 26.40 ml/m  AORTIC VALVE                     PULMONIC VALVE AV Area (Vmax):    1.37 cm      PV Vmax:          0.96 m/s AV Area (Vmean):   1.37 cm      PV Peak grad:  3.7 mmHg AV Area (VTI):     1.45 cm      PR Aislyn Hayse Diast Vel: 2.84 msec AV Vmax:           244.50 cm/s   RVOT Peak grad:   2 mmHg AV Vmean:          153.000 cm/s AV VTI:            0.470 m AV Peak Grad:      23.9 mmHg AV Mean Grad:      11.5 mmHg LVOT Vmax:         148.00 cm/s LVOT Vmean:        92.300 cm/s LVOT VTI:          0.301 m LVOT/AV VTI ratio: 0.64  AORTA Ao Root diam: 2.30 cm MITRAL VALVE               TRICUSPID VALVE MV Area (PHT): 2.82 cm    TR Peak grad:   29.4 mmHg MV Decel Time: 269 msec    TR Vmax:        271.00 cm/s MV E velocity: 73.70 cm/s MV A velocity: 97.30 cm/s  SHUNTS MV E/A ratio:  0.76        Systemic VTI:  0.30 m MV A Prime:    6.7 cm/s    Systemic Diam: 1.70 cm Fransico Him MD Electronically signed by Fransico Him MD Signature Date/Time: 12/30/2020/8:13:18 PM    Final     Cardiac Studies   See cath and echo reports above  Patient Profile     75 y.o. female  with a history of CAD, labile HTN, HCM, severe LVH, syncope and CHB s/p PPM in 09/2016, carotid dzs s/p R CEA , and remote tob abuse, who is bein being followed for non-ST elevation myocardial infarction.  Assessment & Plan     NSTEMI: No further angina reported.  Patient underwent repeat catheterization yesterday demonstrating stable coronary findings from prior catheterization last month.  Attempted PCI to tortuous and heavily calcified RCA was unsuccessful due to inability to cross the lesion with a wire.  Medical therapy was recommended. -Increase metoprolol connate back to 50 mg daily with target heart rate 50-60 bpm in the setting of hypertrophic cardiomyopathy (as tolerated). -Continue 12 months of dual antiplatelet therapy with aspirin and clopidogrel. -If patient has refractory angina, cardiac surgery consultation would need to be considered though she is a suboptimal candidate due to her comorbidities.  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 -Continue dapagliflozin.  Possible sepsis/hypotension: Blood pressure has been labile but upper normal today. -Ongoing management per primary team.  Acute kidney injury: Creatinine stable following catheterization yesterday and improved from peak earlier this admission. -Maintain net even to slightly negative fluid balance in the setting of mildly elevated LVEDP and hypertrophic cardiomyopathy. -Avoid nephrotoxic drugs.    For questions or updates, please contact [place] Please consult [email or web address] for contact info under [facility] Cardiology.     Signed, Nelva Bush, MD  01/01/2021, 1:12 PM

## 2021-01-01 NOTE — Progress Notes (Signed)
Diclofenac cream applied to bilateral knees

## 2021-01-02 ENCOUNTER — Inpatient Hospital Stay: Payer: Medicare HMO

## 2021-01-02 DIAGNOSIS — I429 Cardiomyopathy, unspecified: Secondary | ICD-10-CM

## 2021-01-02 DIAGNOSIS — I214 Non-ST elevation (NSTEMI) myocardial infarction: Secondary | ICD-10-CM | POA: Diagnosis not present

## 2021-01-02 LAB — BASIC METABOLIC PANEL
Anion gap: 5 (ref 5–15)
BUN: 33 mg/dL — ABNORMAL HIGH (ref 8–23)
CO2: 25 mmol/L (ref 22–32)
Calcium: 8.7 mg/dL — ABNORMAL LOW (ref 8.9–10.3)
Chloride: 110 mmol/L (ref 98–111)
Creatinine, Ser: 0.98 mg/dL (ref 0.44–1.00)
GFR, Estimated: 59 mL/min — ABNORMAL LOW (ref >60)
Glucose, Bld: 127 mg/dL — ABNORMAL HIGH (ref 70–99)
Potassium: 4.3 mmol/L (ref 3.5–5.1)
Sodium: 140 mmol/L (ref 135–145)

## 2021-01-02 LAB — CULTURE, BLOOD (ROUTINE X 2)
Culture: NO GROWTH
Culture: NO GROWTH
Special Requests: ADEQUATE
Special Requests: ADEQUATE

## 2021-01-02 LAB — ACTH STIMULATION, 3 TIME POINTS
Cortisol, 30 Min: 20 ug/dL
Cortisol, 60 Min: 24.5 ug/dL
Cortisol, Base: 4.1 ug/dL

## 2021-01-02 LAB — CK: Total CK: 58 U/L (ref 38–234)

## 2021-01-02 LAB — SEDIMENTATION RATE: Sed Rate: 29 mm/hr (ref 0–30)

## 2021-01-02 MED ORDER — BISACODYL 10 MG RE SUPP: 10.0000 mg | Freq: Every day | RECTAL | Status: DC | PRN

## 2021-01-02 MED ORDER — SENNOSIDES-DOCUSATE SODIUM 8.6-50 MG PO TABS
2.0000 | ORAL_TABLET | Freq: Two times a day (BID) | ORAL | Status: DC
  Administered 2021-01-02 – 2021-01-04 (×4): 2 via ORAL
  Filled 2021-01-02 (×4): qty 2

## 2021-01-02 MED ORDER — METOPROLOL SUCCINATE ER 50 MG PO TB24
50.0000 mg | ORAL_TABLET | Freq: Every day | ORAL | Status: DC
  Administered 2021-01-03 – 2021-01-04 (×2): 50 mg via ORAL
  Filled 2021-01-02 (×2): qty 1

## 2021-01-02 MED ORDER — BUTALBITAL-APAP-CAFFEINE 50-325-40 MG PO TABS
1.0000 | ORAL_TABLET | ORAL | Status: DC | PRN
  Administered 2021-01-02 – 2021-01-03 (×2): 1 via ORAL
  Filled 2021-01-02 (×2): qty 1

## 2021-01-02 MED ORDER — LACTULOSE 10 GM/15ML PO SOLN: 20.0000 g | Freq: Two times a day (BID) | ORAL | Status: DC | PRN
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
## 2021-01-02 NOTE — Progress Notes (Signed)
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 She initially was admitted with systemic inflammatory response syndrome but antibiotics were stopped secondary to cultures being negative.  Her first troponin was 105 which was thought to be coming down from her previous NSTEMI.  The following day her troponin was 13,125.  Patient was started on heparin drip over the weekend.  Repeat cardiac cath that showed mid RCA lesion 90% that was not amenable for PCI secondary to inability to cross the stenosis with a wire.  Medical management recommended.  Assessment and Plan: CAD, NSTEMI. Troponin peaked at 13,125.   The patient received 48 hours of heparin drip.   The 90% stenosis of the RCA was not amenable for PCI secondary to inability to cross the stenosis with a guidewire.  Medical management with aspirin, Plavix, for 12 months. Toprol, Crestor.  HOCM. CHB SP pacemaker implant. Cardiology managing medication.  Toprol-XL increased.  Hypotension initially improved with IV fluids SIRS.  Sepsis ruled out.  Cultures negative.  Antibiotics discontinued since procalcitonin negative  Headache. No focal deficit at the time of my evaluation but has some minor left-sided weakness in the upper extremity. CT head unremarkable. Treat with Fioricet. Patient reports similar headache with migraine in the past.  Weakness and bilateral knee pain. Uric acid normal range.  We will try diclofenac gel to knees.  Physical therapy evaluation.  Type 2 diabetes mellitus controlled with hyperlipidemia.   Continue Crestor.  Hemoglobin A1c 6.7.  Continue Farxiga and diet control.  Acute kidney injury on chronic kidney disease stage IIIa.   Creatinine  1.23 on presentation.  Creatinine was as low 0.93.    Borderline low cortisol level. Cosyntropin stimulation test essentially normal. Body mass index is 23.96 kg/m.   Subjective: No chest pain.  No nausea no vomiting.  Reports headache.  Reports weakness.  Physical Exam: General: Appear in mild distress, no Rash; Oral Mucosa Clear, moist. no Abnormal Neck Mass Or lumps, Conjunctiva normal  Cardiovascular: S1 and S2 Present, no Murmur, Respiratory: good respiratory effort, Bilateral Air entry present and CTA, no Crackles, no wheezes Abdomen: Bowel Sound present, Soft and no tenderness Extremities: no Pedal edema Neurology: alert and oriented to time, place, and person affect appropriate. no new focal deficit Gait not checked due to patient safety concerns   Data Reviewed: My review of labs, imaging, notes and other tests shows no new significant findings.   Disposition:  Status is: Inpatient  Remains inpatient appropriate because: Ongoing pain control debility.  Need for SNF.  Family Communication: no family was present at bedside, at the time of interview.   DVT Prophylaxis: enoxaparin (LOVENOX) injection 40 mg Start: 01/01/21 0800   Time spent: 35 minutes. I reviewed all nursing notes, pharmacy notes, vitals, pertinent old records. I have discussed plan of care as described above with RN.  Author: Lynden Oxford  01/02/2021 7:56 PM  To reach On-call, see care teams to locate the attending and reach out via [email or web address]. Between 7PM-7AM, please contact night-coverage If you still have difficulty reaching the attending provider, please page the [facility] (Director on Call) for [facility] on amion for assistance.

## 2021-01-03 DIAGNOSIS — E785 Hyperlipidemia, unspecified: Secondary | ICD-10-CM

## 2021-01-03 DIAGNOSIS — N179 Acute kidney failure, unspecified: Secondary | ICD-10-CM | POA: Diagnosis not present

## 2021-01-03 DIAGNOSIS — E1169 Type 2 diabetes mellitus with other specified complication: Secondary | ICD-10-CM | POA: Diagnosis not present

## 2021-01-03 DIAGNOSIS — N189 Chronic kidney disease, unspecified: Secondary | ICD-10-CM | POA: Diagnosis not present

## 2021-01-03 DIAGNOSIS — I214 Non-ST elevation (NSTEMI) myocardial infarction: Secondary | ICD-10-CM | POA: Diagnosis not present

## 2021-01-03 LAB — CBC WITH DIFFERENTIAL/PLATELET
Abs Immature Granulocytes: 0.01 10*3/uL (ref 0.00–0.07)
Basophils Absolute: 0 10*3/uL (ref 0.0–0.1)
Basophils Relative: 1 %
Eosinophils Absolute: 0.2 10*3/uL (ref 0.0–0.5)
Eosinophils Relative: 3 %
HCT: 33.1 % — ABNORMAL LOW (ref 36.0–46.0)
Hemoglobin: 10.4 g/dL — ABNORMAL LOW (ref 12.0–15.0)
Immature Granulocytes: 0 %
Lymphocytes Relative: 37 %
Lymphs Abs: 1.7 10*3/uL (ref 0.7–4.0)
MCH: 26.7 pg (ref 26.0–34.0)
MCHC: 31.4 g/dL (ref 30.0–36.0)
MCV: 85.1 fL (ref 80.0–100.0)
Monocytes Absolute: 0.5 10*3/uL (ref 0.1–1.0)
Monocytes Relative: 12 %
Neutro Abs: 2.1 10*3/uL (ref 1.7–7.7)
Neutrophils Relative %: 47 %
Platelets: 179 10*3/uL (ref 150–400)
RBC: 3.89 MIL/uL (ref 3.87–5.11)
RDW: 14.6 % (ref 11.5–15.5)
WBC: 4.5 10*3/uL (ref 4.0–10.5)
nRBC: 0 % (ref 0.0–0.2)

## 2021-01-03 LAB — RHEUMATOID FACTOR: Rheumatoid fact SerPl-aCnc: <10 IU/mL (ref <14.0)

## 2021-01-03 LAB — COMPREHENSIVE METABOLIC PANEL
ALT: 28 U/L (ref 0–44)
AST: 22 U/L (ref 15–41)
Albumin: 3.4 g/dL — ABNORMAL LOW (ref 3.5–5.0)
Alkaline Phosphatase: 43 U/L (ref 38–126)
Anion gap: 9 (ref 5–15)
BUN: 32 mg/dL — ABNORMAL HIGH (ref 8–23)
CO2: 24 mmol/L (ref 22–32)
Calcium: 8.9 mg/dL (ref 8.9–10.3)
Chloride: 105 mmol/L (ref 98–111)
Creatinine, Ser: 0.78 mg/dL (ref 0.44–1.00)
GFR, Estimated: >60 mL/min (ref >60)
Glucose, Bld: 102 mg/dL — ABNORMAL HIGH (ref 70–99)
Potassium: 3.9 mmol/L (ref 3.5–5.1)
Sodium: 138 mmol/L (ref 135–145)
Total Bilirubin: 1.1 mg/dL (ref 0.3–1.2)
Total Protein: 6.8 g/dL (ref 6.5–8.1)

## 2021-01-03 LAB — ACTH: C206 ACTH: 1.8 pg/mL — ABNORMAL LOW (ref 7.2–63.3)

## 2021-01-03 LAB — ANA W/REFLEX IF POSITIVE: Anti Nuclear Antibody (ANA): NEGATIVE

## 2021-01-03 MED ORDER — PROCHLORPERAZINE EDISYLATE 10 MG/2ML IJ SOLN
10.0000 mg | Freq: Once | INTRAMUSCULAR | Status: DC
  Filled 2021-01-03: qty 2

## 2021-01-03 MED ORDER — AMLODIPINE BESYLATE 5 MG PO TABS
5.0000 mg | ORAL_TABLET | Freq: Every day | ORAL | Status: DC
  Administered 2021-01-03 – 2021-01-04 (×2): 5 mg via ORAL
  Filled 2021-01-03 (×2): qty 1

## 2021-01-03 MED ORDER — VALPROATE SODIUM 100 MG/ML IV SOLN: 500.0000 mg | Freq: Once | INTRAVENOUS | Status: DC

## 2021-01-03 MED ORDER — VALPROATE SODIUM 250 MG/5ML PO SOLN
500.0000 mg | Freq: Once | ORAL | Status: DC
  Filled 2021-01-03: qty 10

## 2021-01-03 MED ORDER — MORPHINE SULFATE (PF) 2 MG/ML IV SOLN
2.0000 mg | Freq: Once | INTRAVENOUS | Status: DC
  Filled 2021-01-03: qty 1

## 2021-01-03 MED ORDER — ISOSORBIDE MONONITRATE ER 30 MG PO TB24: 30.0000 mg | ORAL_TABLET | Freq: Every day | ORAL | Status: DC

## 2021-01-03 MED ORDER — METOCLOPRAMIDE HCL 5 MG/ML IJ SOLN
5.0000 mg | Freq: Once | INTRAMUSCULAR | Status: DC
  Filled 2021-01-03: qty 2

## 2021-01-03 MED ORDER — DIPHENHYDRAMINE HCL 50 MG/ML IJ SOLN
12.5000 mg | Freq: Once | INTRAMUSCULAR | Status: DC
  Filled 2021-01-03: qty 1

## 2021-01-03 NOTE — Progress Notes (Signed)
Physical Therapy Treatment Patient Details Name: Shannon Chen MRN: [id number] DOB: 11-21-1945 Today's Date: 01/03/2021   History of Present Illness Shannon Chen  is a 75 y.o. female sent in with altered mental status and found to be hypotensive in the emergency room.  Suspected sepsis protocol given with IV fluids. Urine analysis negative and CT scan of the chest negative for pneumonia.  Troponin initially was mildly elevated but subsequently increased to 13,000. Pt admissed for NSTEMI. Per Cardiology, "discussed with her the indication for repeat cardiac catheterization and attempted high risk PCI of the right coronary artery."    PT Comments    Pt received sitting in recliner, reports she feels lousy however is agreeable to therapy. Trial using hemi-walker with STS and ambulation in today's session. She required MOD A to stand from low surfaces (recliner) however CGA to stand from moderately elevated surface of bed, LUE on HW for support. Pt increased ambulation distance to 75f with CGA. Frequent VC for technique, sequencing and placement of HW as pt had tendency to place HW too far in front leading to significant reach and trunk lean and ultimately decreased stability. Technique and stability improved with distance. Pt fatigued upon completion - she ended session in bed with all needs met and alarm activated. [facility]. Would benefit from skilled PT to address above deficits and promote optimal return to PLOF.    Recommendations for follow up therapy are one component of a multi-disciplinary discharge planning process, led by the attending physician.  Recommendations may be updated based on patient status, additional functional criteria and insurance authorization.  Follow Up Recommendations  Home health PT     Assistance Recommended at Discharge Intermittent Supervision/Assistance  Equipment Recommendations  Other (comment)     Recommendations for Other Services       Precautions / Restrictions Precautions Precautions: Fall Restrictions Weight Bearing Restrictions: Yes RUE Weight Bearing: Non weight bearing Other Position/Activity Restrictions: radial cardiac catheter in RUE     Mobility  Bed Mobility Overal bed mobility: Needs Assistance Bed Mobility: Sit to Supine       Sit to supine: Min guard   General bed mobility comments: CGA to ensure pt does not use RUE; VC to place right hand over chest during mobility.    Transfers Overall transfer level: Needs assistance Equipment used: Hemi-walker Transfers: Sit to/from Stand Sit to Stand: Mod assist           General transfer comment: MOD A to lift from a low surface (recliner) with heavy VC to refrain from using RUE - pt instructed to place R hand over chest. CGA to stand from elevated EOB. Hemi-walker placed on left side of pt with reminders to use left UE only.    Ambulation/Gait Ambulation/Gait assistance: Min guard Gait Distance (Feet): 80 Feet Assistive device: Hemi-walker Gait Pattern/deviations: Decreased stride length;Trunk flexed;Narrow base of support;Step-to pattern Gait velocity: decreased     General Gait Details: Trial with hemi-walker - CGA for minimal steadying. Multiple VC to remain close to [facility] reduce forward lean. Technique and steadiness improved with distance.   Stairs             Wheelchair Mobility    Modified Rankin (Stroke Patients Only)       Balance Overall balance assessment: Needs assistance Sitting-balance support: Single extremity supported;Feet supported Sitting balance-Leahy Scale: Good Sitting balance - Comments: Pt prefers to lean onto hand - VC to lean only onto  L hand   Standing balance support: Single extremity supported;During functional activity;Reliant on assistive device for balance Standing balance-Leahy Scale: Poor Standing balance comment: Pt relies on HW during  ambulation                            Cognition Arousal/Alertness: Awake/alert Behavior During Therapy: Flat affect Overall Cognitive Status: No family/caregiver present to determine baseline cognitive functioning                                          Exercises      General Comments        Pertinent Vitals/Pain Pain Assessment: Faces Faces Pain Scale: Hurts little more Pain Location: headache Pain Descriptors / Indicators: Headache Pain Intervention(s): Limited activity within patient's tolerance;Monitored during session;Repositioned    Home Living                          Prior Function            PT Goals (current goals can now be found in the care plan section) Acute Rehab PT Goals Patient Stated Goal: feel better PT Goal Formulation: With patient Time For Goal Achievement: 01/15/21 Potential to Achieve Goals: Good    Frequency    Min 2X/week      PT Plan      Co-evaluation              AM-PAC PT "6 Clicks" Mobility   Outcome Measure  Help needed turning from your back to your side while in a flat bed without using bedrails?: A Little Help needed moving from lying on your back to sitting on the side of a flat bed without using bedrails?: A Little Help needed moving to and from a bed to a chair (including a wheelchair)?: A Little Help needed standing up from a chair using your arms (e.g., wheelchair or bedside chair)?: A Little Help needed to walk in hospital room?: A Little Help needed climbing 3-5 steps with a railing? : A Lot 6 Click Score: 17    End of Session Equipment Utilized During Treatment: Gait belt Activity Tolerance: Patient limited by fatigue Patient left: with call bell/phone within reach;in bed;with bed alarm set Nurse Communication: Mobility status PT Visit Diagnosis: Unsteadiness on feet (R26.81);Difficulty in walking, not elsewhere classified (R26.2);Muscle weakness (generalized)  (M62.81)     Time: 8811-0315 PT Time Calculation (min) (ACUTE ONLY): 19 min  Charges:  $Therapeutic Activity: 8-22 mins                     Patrina Levering PT, DPT 01/03/21 12:29 PM [phone]

## 2021-01-03 NOTE — Care Management Important Message (Signed)
Important Message  Patient Details  Name: Shannon Chen MRN: [id number] Date of Birth: 1945-05-17   Medicare Important Message Given:  Yes     [name] 01/03/2021, 11:30 AM

## 2021-01-03 NOTE — Progress Notes (Signed)
In to check on patient. Per MD, patient stating that she has a 10/10 headache. Medications ordered to give patient to help with headache.  When this nurse entered room to give meds, patient sound asleep in bed.  Will reassess patient when she awakens to see if patient has had any relieve from the headache and will give one time ordered meds if pain is still there.  Dr. Allena Katz aware of plan.

## 2021-01-03 NOTE — Progress Notes (Signed)
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 She initially was admitted with systemic inflammatory response syndrome but antibiotics were stopped secondary to cultures being negative.  Her first troponin was 105 which was thought to be coming down from her previous NSTEMI.  The following day her troponin was 13,125.  Patient was started on heparin drip over the weekend.  Repeat cardiac cath that showed mid RCA lesion 90% that was not amenable for PCI secondary to inability to cross the stenosis with a wire.  Medical management recommended.  Assessment and Plan: CAD, NSTEMI. Troponin peaked at 13,125.   The patient received 48 hours of heparin drip.   The 90% stenosis of the RCA was not amenable for PCI secondary to inability to cross the stenosis with a guidewire.  Medical management with aspirin, Plavix, for 12 months. Toprol, Crestor.  HOCM. CHB SP pacemaker implant. Cardiology managing medication.  Toprol-XL increased.  Hypotension initially improved with IV fluids SIRS.  Sepsis ruled out.  Cultures negative.  Antibiotics discontinued since procalcitonin negative  Headache. No focal deficit at the time of my evaluation but has some minor left-sided weakness in the upper extremity. CT head unremarkable. Treat with Fioricet. Patient reports similar headache with migraine in the past.  Weakness and bilateral knee pain. Uric acid normal range.  We will try diclofenac gel to knees.  Physical therapy evaluation.  Type 2 diabetes mellitus controlled with hyperlipidemia.   Continue Crestor.  Hemoglobin A1c 6.7.  Continue Farxiga and diet control.  Acute kidney injury on chronic kidney disease stage IIIa.   Creatinine  1.23 on presentation.  Creatinine was as low 0.93.    Borderline low cortisol level. Cosyntropin stimulation test essentially normal. Body mass index is 23.96 kg/m.   Subjective: Reports severe headache.  No nausea or vomiting.  Reports 2 out of 10 chest pain as well.  No shortness of breath.  Physical Exam: General: Appear in mild distress, no Rash; Oral Mucosa Clear, moist. no Abnormal Neck Mass Or lumps, Conjunctiva normal  Cardiovascular: S1 and S2 Present, no Murmur, Respiratory: good respiratory effort, Bilateral Air entry present and CTA, no Crackles, no wheezes Abdomen: Bowel Sound present, Soft and no tenderness Extremities: no Pedal edema Neurology: alert and oriented to time, place, and person affect appropriate. no new focal deficit Gait not checked due to patient safety concerns   Data Reviewed: My review of labs, imaging, notes and other tests shows no new significant findings.   Disposition:  Status is: Inpatient  Remains inpatient appropriate because: Requiring aggressive headache treatment.  Family Communication: no family was present at bedside, at the time of interview.   DVT Prophylaxis: enoxaparin (LOVENOX) injection 40 mg Start: 01/01/21 0800   Time spent: 35 minutes. I reviewed all nursing notes, pharmacy notes, vitals, pertinent old records. I have discussed plan of care as described above with RN.  Author: Lynden Oxford  01/03/2021 8:45 PM  To reach On-call, see care teams to locate the attending and reach out via [email or web address]. Between 7PM-7AM, please contact night-coverage If you still have difficulty reaching the attending provider, please page the [facility] (Director on Call) for [facility] on amion for assistance.

## 2021-01-03 NOTE — Progress Notes (Signed)
Mobility Specialist - Progress Note   01/03/21 1251  Mobility  Activity Ambulated in room  Level of Assistance Standby assist, set-up cues, supervision of patient - no hands on  Assistive Device Other (Comment) (HW)  Distance Ambulated (ft) 25 ft  Mobility Ambulated with assistance in room  Mobility Response Tolerated well  Mobility performed by Mobility specialist  $Mobility charge 1 Mobility    Pt lying in bed upon arrival, utilizing RA. Voices headache and pain in BLE but agreeable to session. ModA to reach EOB with VC to maintain NWB status on RUE. Stood from elevated bed height with minA and cues for hand placement. VC for HW technique. Moderate dizziness and fatigue limiting further distance. BP 128/68. Pt returned to bed with alarm set. HR 70-80s.    Filiberto Pinks Mobility Specialist 01/03/21, 12:55 PM

## 2021-01-03 NOTE — Progress Notes (Signed)
Progress Note  Patient Name: Shannon Chen Date of Encounter: 01/03/2021  Primary Cardiologist: Rockey Situ  Subjective   Did not sleep well. Has had a headache for 2-3 days. No chest pain or dyspnea.   Inpatient Medications    Scheduled Meds:  aspirin EC  81 mg Oral Daily   clopidogrel  75 mg Oral Daily   dapagliflozin propanediol  10 mg Oral Daily   diclofenac Sodium  4 g Topical QID   enoxaparin (LOVENOX) injection  40 mg Subcutaneous Q24H   fluticasone furoate-vilanterol  1 puff Inhalation Daily   And   umeclidinium bromide  1 puff Inhalation Daily   metoprolol succinate  50 mg Oral Daily   mirtazapine  7.5 mg Oral QHS   polyethylene glycol  17 g Oral Daily   rosuvastatin  40 mg Oral Daily   senna-docusate  2 tablet Oral BID   sodium chloride flush  3 mL Intravenous Q12H   Continuous Infusions:  sodium chloride     PRN Meds: sodium chloride, acetaminophen, albuterol, bisacodyl, butalbital-acetaminophen-caffeine, hydrALAZINE, lactulose, ondansetron (ZOFRAN) IV, oxyCODONE, sodium chloride flush   Vital Signs    Vitals:   01/03/21 0023 01/03/21 0205 01/03/21 0400 01/03/21 0900  BP: (!) 183/76 (!) 131/55 (!) 139/51 (!) 142/56  Pulse: 76  73 78  Resp: 18  18 18   Temp: (!) 97.3 F (36.3 C)  98.2 F (36.8 C) 98.2 F (36.8 C)  TempSrc:   Axillary Axillary  SpO2: 96%  99% 98%  Weight:      Height:        Intake/Output Summary (Last 24 hours) at 01/03/2021 1001 Last data filed at 01/03/2021 0900 Gross per 24 hour  Intake 1340 ml  Output 1500 ml  Net -160 ml   Filed Weights   12/28/20 0947 12/29/20 1700 12/31/20 1450  Weight: 63.5 kg 63.6 kg 65.3 kg    Telemetry    SR - Personally Reviewed  ECG    No new tracings - Personally Reviewed  Physical Exam   GEN: No acute distress.   Neck: No JVD. Cardiac: RRR, II/VI systolic murmur, no rubs, or gallops.  Respiratory: Clear to auscultation bilaterally.  GI: Soft, nontender, non-distended.   MS: No  edema; No deformity. Neuro:  Alert and oriented x 3; Nonfocal.  Psych: Normal affect.  Labs    Chemistry Recent Labs  Lab 12/28/20 0958 12/29/20 0511 01/01/21 0505 01/02/21 0356 01/03/21 0655  NA 139   < > 140 140 138  K 3.5   < > 4.3 4.3 3.9  CL 108   < > 110 110 105  CO2 23   < > 24 25 24   GLUCOSE 185*   < > 108* 127* 102*  BUN 35*   < > 29* 33* 32*  CREATININE 1.23*   < > 0.98 0.98 0.78  CALCIUM 9.3   < > 8.7* 8.7* 8.9  PROT 7.3  --   --   --  6.8  ALBUMIN 3.6  --   --   --  3.4*  AST 31  --   --   --  22  ALT 40  --   --   --  28  ALKPHOS 56  --   --   --  43  BILITOT 1.2  --   --   --  1.1  GFRNONAA 45*   < > 59* 59* >60  ANIONGAP 8   < > 6 5 9    < > =  values in this interval not displayed.     Hematology Recent Labs  Lab 12/31/20 0433 01/01/21 0505 01/03/21 0655  WBC 4.5 4.7 4.5  RBC 4.03 3.99 3.89  HGB 10.7* 10.4* 10.4*  HCT 33.7* 34.1* 33.1*  MCV 83.6 85.5 85.1  MCH 26.6 26.1 26.7  MCHC 31.8 30.5 31.4  RDW 14.2 14.4 14.6  PLT 189 182 179    Cardiac EnzymesNo results for input(s): TROPONINI in the last 168 hours. No results for input(s): TROPIPOC in the last 168 hours.   BNPNo results for input(s): BNP, PROBNP in the last 168 hours.   DDimer No results for input(s): DDIMER in the last 168 hours.   Radiology    CT HEAD WO CONTRAST (5MM)  Result Date: 01/02/2021 IMPRESSION: No acute intracranial findings are seen. There are no signs of bleeding within the cranium. Atrophy.  Small-vessel disease.  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 Electronically Signed   By: Elmer Picker M.D.   On: 01/02/2021 16:53    Cardiac Studies   LHC 12/31/2020:   Dist Cx lesion is 30% stenosed.   1st Mrg lesion is 60% stenosed.   Prox LAD lesion is 50% stenosed.   Prox RCA lesion is 60% stenosed.   Mid RCA lesion is 90% stenosed.   1.  Severe one-vessel coronary artery disease involving the mid right coronary artery.  The right coronary artery is  severely tortuous and calcified.  No significant change since recent cardiac catheterization. 2.  Left ventricular angiography was not performed.  EF was normal by echo.  Mildly elevated left ventricular end-diastolic pressure. 3.  Attempted unsuccessful angioplasty of the right coronary artery due to inability to cross the stenosis with a wire in spite of multiple attempts.   Recommendations: No options for PCI of the right coronary artery.  Recommend continuing medical therapy as is being done and maximizing antianginal medications. __________  2D echo 12/30/2020: 1. Left ventricular ejection fraction, by estimation, is 60 to 65%. The  left ventricle has normal function. The left ventricle has no regional  wall motion abnormalities. There is severe left ventricular hypertrophy of  the basal-septal segment. Left  ventricular diastolic parameters are consistent with Grade I diastolic  dysfunction (impaired relaxation). Elevated left ventricular end-diastolic  pressure.   2. Right ventricular systolic function is normal. The right ventricular  size is normal.   3. The mitral valve is normal in structure. Trivial mitral valve  regurgitation. No evidence of mitral stenosis.   4. The aortic valve is calcified. Aortic valve regurgitation is not  visualized. Mild aortic valve stenosis. Aortic valve area, by VTI measures  1.45 cm. Aortic valve mean gradient measures 11.5 mmHg. Aortic valve Vmax  measures 2.45 m/s.  __________  LHC 12/13/2020:   Moderate to Severe calcification of all epicardial coronary arteries; tortuous arteries.   Mid RCA lesion is 70% stenosed.  (Very discrete lesion, questionable significance.  Range from 30 to 90% depending on image)   Dist Cx lesion is 30% stenosed.   LV end diastolic pressure is normal.   There is no aortic valve stenosis.   Post-Operative Diagnoses: Heavily calcified coronary arteries with diffuse mild to moderate calcific stenosis throughout.   Potential culprit lesion is a possible focal lesion in the mid RCA questionable significance.   In some images, there appears to be minimal stenosis whereas another images it appears to be severe.  Likely heavily calcified vessel with hazy appearance because of calcification. =   This lesion  is in a very tortuous heavily calcified vessel, not able to advance FFR wired halfway to the lesion.  Likely not very amenable to PCI. Despite having Hypertrophic Cardiomyopathy, the LVEDP was normal at 14 mmHg. ->  LV gram not performed in order to conserve contrast.     Recommendations: Aggressive risk factor modification for extensive calcified CAD. If the patient does have active anginal symptoms, could consider noninvasive evaluation of the RCA to determine if this is significant.  Would need to review with interventional colleagues to determine if there is any potential option as the entire vessel is calcified, very tortuous making it highly unlikely to be actually successfully treated.  Would definitely need to be done by the femoral access due to the 270  turn in the innominate artery __________   2D echo 12/12/2020: 1. Left ventricular ejection fraction, by estimation, is 60 to 65%. The  left ventricle has normal function. The left ventricle has no regional  wall motion abnormalities. There is severe left ventricular hypertrophy.  Left ventricular diastolic parameters   are indeterminate.   2. Right ventricular systolic function is normal. The right ventricular  size is normal. Tricuspid regurgitation signal is inadequate for assessing  PA pressure.   3. The mitral valve is normal in structure. Mild mitral valve  regurgitation. No evidence of mitral stenosis.   4. The aortic valve is normal in structure. Aortic valve regurgitation is  not visualized. Mild to moderate aortic valve sclerosis/calcification is  present, without any evidence of aortic stenosis.   5. The inferior vena cava is normal  in size with greater than 50%  respiratory variability, suggesting right atrial pressure of 3 mmHg.  Patient Profile     75 y.o. female with history of CAD, HCM/severe LVH, carotid artery stenosis s/p right-sided CEA, syncope, labile hypertension, heart block s/p PPM in 09/2016, and prior tobacco use who we are seeing for NSTEMI.  Assessment & Plan    1. CAD with NSTEMI: -Unable to cross the stenosis of the RCA on repeat LHC this admission as above with recommendation for medical management -Initially, we planned to add Imdur today, but with reported headache, we stopped this medication prior to it being given -Add back amlodipine 5 mg for antianginal therapy  -Continue ASA, Plavix, Toprol -Post cath instructions  2. HCM/severe LVH/HFpEF: -Continue Toprol XL and dapaglifozin -No syncope  3. Mobitz type II 2nd degree AV block: -Status post pacemaker -Device appears to be functioning normally -Follow up with EP as an outpatient   4. Carotid artery disease: -Outpatient follow up -ASA/Plavix/Crestor   5. HLD: -LDL 57 -Crestor      For questions or updates, please contact [facility] Please consult [email or web address] for contact info under Cardiology/STEMI.    Signed, Eula Listen, PA-C [facility] Pager: [phone] 01/03/2021, 10:01 AM

## 2021-01-04 MED ORDER — BUTALBITAL-APAP-CAFFEINE 50-325-40 MG PO TABS: 1.0000 | ORAL_TABLET | ORAL | 0 refills | Status: DC | PRN

## 2021-01-04 MED ORDER — AMLODIPINE BESYLATE 5 MG PO TABS: 5.0000 mg | ORAL_TABLET | Freq: Every day | ORAL | 0 refills | Status: DC

## 2021-01-04 MED ORDER — DOCUSATE SODIUM 100 MG PO CAPS: 100.0000 mg | ORAL_CAPSULE | Freq: Two times a day (BID) | ORAL | 2 refills | Status: AC

## 2021-01-04 NOTE — TOC Transition Note (Addendum)
Transition of Care [facility]) - CM/SW Discharge Note   Patient Details  Name: Shannon Chen MRN: [id number] Date of Birth: [birth year]  Transition of Care [facility]) CM/SW Contact:  Liliana Cline, LCSW Phone Number: 01/04/2021, 12:08 PM   Clinical Narrative:   Patient has orders to DC today. CSW notified Adelina Mings with [facility].  Called [facility] and spoke with Daphne. Daphne checked with her Manager who said they cannot pick up patient today. Explained patient is medically ready and we can arrange transport. Daphne reported she will be there to receive patient today if we can arrange transport.  Address confirmed - [address] [place], [place]. Confirmed with RN that patient is appropriate for [employer].    12:15- Return call from [place] who stated her Manager said patient cannot come back today and that they will have to come assess patient at bedside first which probably will not be today. Called Daphne's Manager- Evalyn Casco at [phone]. Clara stated her sister is the Production designer, theatre/television/film of [facility]) and is having a family emergency, so may not be able to assess patient today. Explained patient walked 80 feet with PT and is medically cleared for DC. Clara stated she will need to try to get in touch with Cordelia Pen but that they may not be able to take patient back today. Reached out to [facility] Supervisor for guidance.    12:45- Per TOC Supervisor [facility] has the right to want to assess patient before her coming back there. Will await return call from [facility].  1:40- Call from [facility] who stated Cordelia [facility] called her and said patient can come back to [facility] today. Called [facility] for Door to Door transport. Spoke with [employer] He stated he will call back with pick up time. Updated RN.  1:50- Call from [facility] who stated Clara is already there to pick patient up from the hospital. [facility].   Final next level of care: Home  w Home Health Services Barriers to Discharge: Barriers Resolved   Patient Goals and CMS Choice Patient states their goals for this hospitalization and ongoing recovery are:: back to group home with hh   Choice offered to / list presented to : Patient  Discharge Placement                Patient to be transferred to facility by: [facility] Name of family member notified: Daphne Patient and family notified of of transfer: 01/04/21  Discharge Plan and Services In-house Referral: NA   Post Acute Care Choice: Home Health                    HH Arranged: PT, OT [facility] Agency: [facility] Date [facility] Agency Contacted: 01/04/21 Time HH Agency Contacted: 1427 Representative spoke with at [facility] Agency: Adelina Mings  Social Determinants of Health (SDOH) Interventions     Readmission Risk Interventions Readmission Risk Prevention Plan 12/15/2020  Transportation Screening Complete  PCP or Specialist Appt within 5-7 Days Complete  Home Care Screening Complete  Medication Review (RN CM) Complete  Some recent data might be hidden

## 2021-01-06 ENCOUNTER — Emergency Department
Admission: EM | Admit: 2021-01-06 | Discharge: 2021-01-07 | Disposition: A | Discharge status: Home / Self Care | Payer: Medicare HMO | Attending: Emergency Medicine | Admitting: Emergency Medicine

## 2021-01-06 ENCOUNTER — Encounter: Payer: Self-pay | Admitting: Emergency Medicine

## 2021-01-06 ENCOUNTER — Other Ambulatory Visit: Payer: Self-pay

## 2021-01-06 ENCOUNTER — Emergency Department: Payer: Medicare HMO

## 2021-01-06 DIAGNOSIS — I10 Essential (primary) hypertension: Secondary | ICD-10-CM | POA: Insufficient documentation

## 2021-01-06 DIAGNOSIS — J449 Chronic obstructive pulmonary disease, unspecified: Secondary | ICD-10-CM | POA: Diagnosis not present

## 2021-01-06 DIAGNOSIS — I2510 Atherosclerotic heart disease of native coronary artery without angina pectoris: Secondary | ICD-10-CM | POA: Diagnosis not present

## 2021-01-06 DIAGNOSIS — Z87891 Personal history of nicotine dependence: Secondary | ICD-10-CM | POA: Insufficient documentation

## 2021-01-06 DIAGNOSIS — Z7951 Long term (current) use of inhaled steroids: Secondary | ICD-10-CM | POA: Insufficient documentation

## 2021-01-06 DIAGNOSIS — R9431 Abnormal electrocardiogram [ECG] [EKG]: Secondary | ICD-10-CM | POA: Diagnosis not present

## 2021-01-06 DIAGNOSIS — R519 Headache, unspecified: Secondary | ICD-10-CM | POA: Insufficient documentation

## 2021-01-06 DIAGNOSIS — Z950 Presence of cardiac pacemaker: Secondary | ICD-10-CM | POA: Insufficient documentation

## 2021-01-06 DIAGNOSIS — Z79899 Other long term (current) drug therapy: Secondary | ICD-10-CM | POA: Diagnosis not present

## 2021-01-06 DIAGNOSIS — I252 Old myocardial infarction: Secondary | ICD-10-CM | POA: Diagnosis present

## 2021-01-06 DIAGNOSIS — E1140 Type 2 diabetes mellitus with diabetic neuropathy, unspecified: Secondary | ICD-10-CM | POA: Insufficient documentation

## 2021-01-06 DIAGNOSIS — Z5189 Encounter for other specified aftercare: Secondary | ICD-10-CM | POA: Diagnosis not present

## 2021-01-06 DIAGNOSIS — Z7982 Long term (current) use of aspirin: Secondary | ICD-10-CM | POA: Insufficient documentation

## 2021-01-06 DIAGNOSIS — I4510 Unspecified right bundle-branch block: Secondary | ICD-10-CM | POA: Diagnosis not present

## 2021-01-06 LAB — COMPREHENSIVE METABOLIC PANEL
ALT: 32 U/L (ref 0–44)
AST: 31 U/L (ref 15–41)
Albumin: 3.6 g/dL (ref 3.5–5.0)
Alkaline Phosphatase: 49 U/L (ref 38–126)
Anion gap: 5 (ref 5–15)
BUN: 39 mg/dL — ABNORMAL HIGH (ref 8–23)
CO2: 24 mmol/L (ref 22–32)
Calcium: 8.9 mg/dL (ref 8.9–10.3)
Chloride: 106 mmol/L (ref 98–111)
Creatinine, Ser: 0.96 mg/dL (ref 0.44–1.00)
GFR, Estimated: >60 mL/min (ref >60)
Glucose, Bld: 123 mg/dL — ABNORMAL HIGH (ref 70–99)
Potassium: 4.1 mmol/L (ref 3.5–5.1)
Sodium: 135 mmol/L (ref 135–145)
Total Bilirubin: 1.1 mg/dL (ref 0.3–1.2)
Total Protein: 7.4 g/dL (ref 6.5–8.1)

## 2021-01-06 LAB — CBC
HCT: 35.1 % — ABNORMAL LOW (ref 36.0–46.0)
Hemoglobin: 11 g/dL — ABNORMAL LOW (ref 12.0–15.0)
MCH: 27.3 pg (ref 26.0–34.0)
MCHC: 31.3 g/dL (ref 30.0–36.0)
MCV: 87.1 fL (ref 80.0–100.0)
Platelets: 198 10*3/uL (ref 150–400)
RBC: 4.03 MIL/uL (ref 3.87–5.11)
RDW: 14.6 % (ref 11.5–15.5)
WBC: 4.9 10*3/uL (ref 4.0–10.5)
nRBC: 0 % (ref 0.0–0.2)

## 2021-01-06 LAB — TROPONIN I (HIGH SENSITIVITY)
Troponin I (High Sensitivity): 190 ng/L (ref <18)
Troponin I (High Sensitivity): 193 ng/L (ref <18)

## 2021-01-06 MED ORDER — DIPHENHYDRAMINE HCL 50 MG/ML IJ SOLN
25.0000 mg | Freq: Once | INTRAMUSCULAR | Status: AC
  Administered 2021-01-06: 25 mg via INTRAVENOUS
  Filled 2021-01-06: qty 1

## 2021-01-06 MED ORDER — PROCHLORPERAZINE MALEATE 10 MG PO TABS: 10.0000 mg | ORAL_TABLET | Freq: Four times a day (QID) | ORAL | 0 refills | Status: DC | PRN

## 2021-01-06 MED ORDER — PROCHLORPERAZINE EDISYLATE 10 MG/2ML IJ SOLN
10.0000 mg | Freq: Once | INTRAMUSCULAR | Status: AC
  Administered 2021-01-06: 10 mg via INTRAVENOUS
  Filled 2021-01-06: qty 2

## 2021-01-06 NOTE — ED Triage Notes (Addendum)
Pt c/o sharpe intermittent pain in the right frontal and parietal area of head x1 day. Pt denies falling or hitting head. Pt denies N/V, speech clear, hand grip equal and strong, no facial droop, negative bilateral drift. No previous CVA reported. Pt reports recent CT scan 1 week ago.

## 2021-01-06 NOTE — ED Notes (Signed)
This RN contacted [facility] to inform the staff that the patient will be returning back the group home. The staff member asked that this RN contact them again in 10 mins once she contacts her Supervisor.
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
## 2021-01-06 NOTE — ED Triage Notes (Signed)
First RN Note: pt to ED via [facility] from [facility] with c/o HA  x 3hrs that is intermittent and stabbing pain in nature. Per EMS pt also c/o with known abscessed tooth on R side. Per EMS pt with negative stroke screen, denies recent falls, denies blood thinner use.     170/90 70HR CBG 130

## 2021-01-06 NOTE — ED Notes (Signed)
To CT scan

## 2021-01-07 ENCOUNTER — Ambulatory Visit: Payer: Medicare HMO

## 2021-01-07 NOTE — ED Notes (Signed)
[facility]) contacted and notified of the patients return to the facility and that EMS was picking up the patient now.
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
## 2021-01-08 ENCOUNTER — Encounter: Payer: Self-pay | Admitting: Cardiovascular Disease

## 2021-01-08 ENCOUNTER — Other Ambulatory Visit: Payer: Self-pay

## 2021-01-08 ENCOUNTER — Ambulatory Visit (INDEPENDENT_AMBULATORY_CARE_PROVIDER_SITE_OTHER): Payer: Medicare HMO | Admitting: Cardiovascular Disease

## 2021-01-08 VITALS — BP 100/50 | Ht 65.5 in | Wt 145.0 lb

## 2021-01-08 DIAGNOSIS — E1169 Type 2 diabetes mellitus with other specified complication: Secondary | ICD-10-CM

## 2021-01-08 DIAGNOSIS — Z950 Presence of cardiac pacemaker: Secondary | ICD-10-CM

## 2021-01-08 DIAGNOSIS — I422 Other hypertrophic cardiomyopathy: Secondary | ICD-10-CM

## 2021-01-08 DIAGNOSIS — I441 Atrioventricular block, second degree: Secondary | ICD-10-CM

## 2021-01-08 DIAGNOSIS — E785 Hyperlipidemia, unspecified: Secondary | ICD-10-CM

## 2021-01-08 DIAGNOSIS — I25118 Atherosclerotic heart disease of native coronary artery with other forms of angina pectoris: Secondary | ICD-10-CM | POA: Diagnosis not present

## 2021-01-08 DIAGNOSIS — I6523 Occlusion and stenosis of bilateral carotid arteries: Secondary | ICD-10-CM

## 2021-01-08 DIAGNOSIS — I739 Peripheral vascular disease, unspecified: Secondary | ICD-10-CM

## 2021-01-08 NOTE — Patient Instructions (Addendum)
Medication Instructions:  NTG SL 0.4 mg as needed for chest pain, repeat after 10 minutes only for continued chest pain --Make sure sitting or laying down when taking nitro  If you need a refill on your cardiac medications before your next appointment, please call your pharmacy.   Lab work: No new labs needed  Testing/Procedures: No new testing needed  Follow-Up: At [facility], you and your health needs are our priority.  As part of our continuing mission to provide you with exceptional heart care, we have created designated Provider Care Teams.  These Care Teams include your primary Cardiologist (physician) and Advanced Practice Providers (APPs -  Physician Assistants and Nurse Practitioners) who all work together to provide you with the care you need, when you need it.  You will need a follow up appointment in 6 months  Providers on your designated Care Team:   Nicolasa Ducking, NP Eula Listen, PA-C Cadence Fransico Michael, [place]  COVID-19 Vaccine Information can be found at: [email or web address] For questions related to vaccine distribution or appointments, please email [email or web address] or call [phone].

## 2021-01-09 NOTE — Discharge Summary (Signed)
Physician Discharge Summary   Patient name: Shannon Chen  Admit date:     12/28/2020  Discharge date: 01/04/2021   Discharge Physician: Berle Mull   PCP: Remi Haggard, FNP   Recommendations at discharge: follow up with PCP as recommended  Discharge Diagnoses Active Problems:   Acute kidney injury superimposed on CKD [facility])   Type 2 diabetes mellitus with hyperlipidemia ([place])   Non-ST elevation (NSTEMI) myocardial infarction ([place])   Hypotension   Acute pain of both knees  Hospital Course   CAD, NSTEMI. Troponin peaked at 13,125.   The patient received 48 hours of heparin drip.   The 90% stenosis of the RCA was not amenable for PCI secondary to inability to cross the stenosis with a guidewire.  Medical management with aspirin, Plavix, for 12 months. Toprol, Crestor.   HOCM. CHB SP pacemaker implant. Cardiology managing medication.  Toprol-XL increased.   Hypotension  Initially improved with IV fluids SIRS like picture. Sepsis ruled out.   Cultures negative.   Antibiotics discontinued since procalcitonin negative   Headache. No focal deficit at the time of my evaluation but has some minor left-sided weakness in the upper extremity. CT head unremarkable. Treat with Fioricet. Patient reports similar headache with migraine in the past.   Weakness and bilateral knee pain. Uric acid normal range.  We will try diclofenac gel to knees.  Physical therapy evaluation.   Type 2 diabetes mellitus controlled with hyperlipidemia.   Continue Crestor.  Hemoglobin A1c 6.7.  Continue Farxiga and diet control.   Acute kidney injury on chronic kidney disease stage IIIa.   Creatinine 1.23 on presentation.  Creatinine was as low 0.93.     Borderline low cortisol level. Cosyntropin stimulation test essentially normal. Body mass index is 23.96 kg/m.  Procedures performed: left Cardiac Catheterization     Dist Cx lesion is 30% stenosed.   1st Mrg lesion is 60% stenosed.    Prox LAD lesion is 50% stenosed.   Prox RCA lesion is 60% stenosed.   Mid RCA lesion is 90% stenosed.   1.  Severe one-vessel coronary artery disease involving the mid right coronary artery.  The right coronary artery is severely tortuous and calcified.  No significant change since recent cardiac catheterization. 2.  Left ventricular angiography was not performed.  EF was normal by echo.  Mildly elevated left ventricular end-diastolic pressure. 3.  Attempted unsuccessful angioplasty of the right coronary artery due to inability to cross the stenosis with a wire in spite of multiple attempts.   Recommendations: No options for PCI of the right coronary artery.  Recommend continuing medical therapy as is being done and maximizing antianginal medications.  Echocardiogram   Condition at discharge: good  Exam General: Appear in mild distress, no Rash; Oral Mucosa Clear, moist. no Abnormal Neck Mass Or lumps, Conjunctiva normal  Cardiovascular: S1 and S2 Present, no Murmur, Respiratory: good respiratory effort, Bilateral Air entry present and CTA, no Crackles, no wheezes Abdomen: Bowel Sound present, Soft and no tenderness Extremities: no Pedal edema Neurology: alert and oriented to time, place, and person affect appropriate. no new focal deficit Gait not checked due to patient safety concerns    Disposition: Home  Discharge time: greater than 30 minutes.   Allergies as of 01/04/2021   No Known Allergies      Medication List     STOP taking these medications    amLODipine 2.5 MG tablet Commonly known as: NORVASC   loperamide 2 MG capsule  Commonly known as: IMODIUM   potassium chloride 10 MEQ tablet Commonly known as: KLOR-CON       TAKE these medications    acetaminophen 325 MG tablet Commonly known as: TYLENOL Take 650 mg by mouth every 4 (four) hours as needed for mild pain or moderate pain.   albuterol 108 (90 Base) MCG/ACT inhaler Commonly known as: VENTOLIN  HFA Inhale 1-2 puffs into the lungs every 6 (six) hours as needed for wheezing or shortness of breath.   aspirin 81 MG EC tablet Take 1 tablet (81 mg total) by mouth daily. Swallow whole.   butalbital-acetaminophen-caffeine 50-325-40 MG tablet Commonly known as: FIORICET Take 1 tablet by mouth every 4 (four) hours as needed for headache.   clopidogrel 75 MG tablet Commonly known as: PLAVIX Take 1 tablet (75 mg total) by mouth daily.   dapagliflozin propanediol 10 MG Tabs tablet Commonly known as: FARXIGA Take 10 mg by mouth daily.   docusate sodium 100 MG capsule Commonly known as: Colace Take 1 capsule (100 mg total) by mouth 2 (two) times daily.   Gemtesa 75 MG Tabs Generic drug: Vibegron Take 75 mg by mouth daily.   Kerendia 20 MG Tabs Generic drug: Finerenone Take 20 mg by mouth daily.   metoprolol succinate 50 MG 24 hr tablet Commonly known as: TOPROL-XL Take 1 tablet (50 mg total) by mouth daily.   mirtazapine 7.5 MG tablet Commonly known as: REMERON Take 7.5 mg by mouth at bedtime.   polyethylene glycol 17 g packet Commonly known as: MiraLax Take 17 g by mouth daily as needed for mild constipation.   rosuvastatin 40 MG tablet Commonly known as: CRESTOR TAKE 1 TABLET BY MOUTH DAILY   Trelegy Ellipta 200-62.5-25 MCG/ACT Aepb Generic drug: Fluticasone-Umeclidin-Vilant Inhale 1 puff into the lungs daily.        DG Chest 2 View  Result Date: 12/12/2020 CLINICAL DATA:  Near-syncope EXAM: CHEST - 2 VIEW COMPARISON:  Chest radiograph 05/18/2020 FINDINGS: The left chest wall cardiac device and associated leads are stable. The cardiomediastinal silhouette is stable, with unchanged calcified atherosclerotic plaque of the aortic arch. There is no focal consolidation or pulmonary edema. There is no pleural effusion or pneumothorax. There is no acute osseous abnormality. IMPRESSION: Stable chest with no radiographic evidence of acute cardiopulmonary process.  Electronically Signed   By: Valetta Mole M.D.   On: 12/12/2020 13:05   CT Head Wo Contrast  Result Date: 01/06/2021 CLINICAL DATA:  Headache and head trauma EXAM: CT HEAD WITHOUT CONTRAST TECHNIQUE: Contiguous axial images were obtained from the base of the skull through the vertex without intravenous contrast. COMPARISON:  None. FINDINGS: Brain: There is no mass, hemorrhage or extra-axial collection. There is generalized atrophy without lobar predilection. Hypodensity of the white matter is most commonly associated with chronic microvascular disease. Vascular: Atherosclerotic calcification of the internal carotid arteries at the skull base. No abnormal hyperdensity of the major intracranial arteries or dural venous sinuses. Skull: The visualized skull base, calvarium and extracranial soft tissues are normal. Sinuses/Orbits: No fluid levels or advanced mucosal thickening of the visualized paranasal sinuses. No mastoid or middle ear effusion. The orbits are normal. IMPRESSION: Chronic microvascular ischemia and generalized atrophy without acute intracranial abnormality. Cerebral Atrophy (ICD10-G31.9). Electronically Signed   By: Ulyses Jarred M.D.   On: 01/06/2021 21:12   CT HEAD WO CONTRAST (5MM)  Result Date: 01/02/2021 CLINICAL DATA:  Headaches EXAM: CT HEAD WITHOUT CONTRAST TECHNIQUE: Contiguous axial images were obtained from the base  of the skull through the vertex without intravenous contrast. COMPARISON:  12/12/2020 FINDINGS: Brain: No acute intracranial findings are seen. There are no signs of bleeding. There is no shift of midline structures. Cortical sulci are prominent. There are old lacunar infarcts in the basal ganglia. There is decreased density in the subcortical and periventricular white matter. Vascular: Unremarkable. Skull: Unremarkable. Sinuses/Orbits: There is mucosal thickening in the ethmoid and maxillary sinuses. Other: There is small smooth marginated subcutaneous structure in the  right face, possibly sebaceous cyst. IMPRESSION: No acute intracranial findings are seen. There are no signs of bleeding within the cranium. Atrophy.  Small-vessel disease.  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 Electronically Signed   By: Elmer Picker M.D.   On: 01/02/2021 16:53   CT HEAD WO CONTRAST (5MM)  Result Date: 12/12/2020 CLINICAL DATA:  Weakness, neuro deficit EXAM: CT HEAD WITHOUT CONTRAST TECHNIQUE: Contiguous axial images were obtained from the base of the skull through the vertex without intravenous contrast. COMPARISON:  CT head 06/09/2020 FINDINGS: Brain: There is no evidence of acute intracranial hemorrhage, extra-axial fluid collection, or acute infarct. There is unchanged global parenchymal volume loss with enlargement of the ventricular system. The ventricles are stable in size. 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 Additional confluent hypodensity throughout the remainder of the subcortical and periventricular white matter is unchanged, again likely reflecting sequela of advanced chronic white matter microangiopathy. There is no mass lesion.  There is no midline shift. Vascular: There is calcification of the bilateral cavernous ICAs and vertebral arteries. Skull: Normal. Negative for fracture or focal lesion. Sinuses/Orbits: There is mild mucosal thickening in the paranasal sinuses. The globes and orbits are unremarkable. Other: There is a 1.6 cm by 1.0 cm hypodense lesion in the soft tissues of the right cheek, present since at least 2018 which may reflect a sebaceous cyst. IMPRESSION: 1. No acute intracranial pathology. 2. Unchanged global parenchymal volume loss, chronic white matter microangiopathy, and remote lacunar infarcts in the bilateral basal ganglia. 3. 1.6 cm hypodense lesion in the soft tissues of the right cheek has been present since at least 2018 and may reflect a sebaceous cyst. Correlate with exam. Electronically  Signed   By: Valetta Mole M.D.   On: 12/12/2020 13:10   CARDIAC CATHETERIZATION  Result Date: 12/31/2020   Dist Cx lesion is 30% stenosed.   1st Mrg lesion is 60% stenosed.   Prox LAD lesion is 50% stenosed.   Prox RCA lesion is 60% stenosed.   Mid RCA lesion is 90% stenosed. 1.  Severe one-vessel coronary artery disease involving the mid right coronary artery.  The right coronary artery is severely tortuous and calcified.  No significant change since recent cardiac catheterization. 2.  Left ventricular angiography was not performed.  EF was normal by echo.  Mildly elevated left ventricular end-diastolic pressure. 3.  Attempted unsuccessful angioplasty of the right coronary artery due to inability to cross the stenosis with a wire in spite of multiple attempts. Recommendations: No options for PCI of the right coronary artery.  Recommend continuing medical therapy as is being done and maximizing antianginal medications.   CARDIAC CATHETERIZATION  Result Date: 12/13/2020   Moderate to Severe calcification of all epicardial coronary arteries; tortuous arteries.   Mid RCA lesion is 70% stenosed.  (Very discrete lesion, questionable significance.  Range from 30 to 90% depending on image)   Dist Cx lesion is 30% stenosed.   LV end diastolic pressure is normal.  There is no aortic valve stenosis. Post-Operative Diagnoses: Heavily calcified coronary arteries with diffuse mild to moderate calcific stenosis throughout. Potential culprit lesion is a possible focal lesion in the mid RCA questionable significance.   In some images, there appears to be minimal stenosis whereas another images it appears to be severe.  Likely heavily calcified vessel with hazy appearance because of calcification. =  This lesion is in a very tortuous heavily calcified vessel, not able to advance FFR wired halfway to the lesion.  Likely not very amenable to PCI. Despite having Hypertrophic Cardiomyopathy, the LVEDP was normal at 14 mmHg.  ->  LV gram not performed in order to conserve contrast. Recommendations: Aggressive risk factor modification for extensive calcified CAD. If the patient does have active anginal symptoms, could consider noninvasive evaluation of the RCA to determine if this is significant.  Would need to review with interventional colleagues to determine if there is any potential option as the entire vessel is calcified, very tortuous making it highly unlikely to be actually successfully treated.  Would definitely need to be done by the femoral access due to the 270  turn in the innominate artery Glenetta Hew, MD  CT CHEST ABDOMEN PELVIS W CONTRAST  Result Date: 12/28/2020 CLINICAL DATA:  Sepsis, left flank pain. EXAM: CT CHEST, ABDOMEN, AND PELVIS WITH CONTRAST TECHNIQUE: Multidetector CT imaging of the chest, abdomen and pelvis was performed following the standard protocol during bolus administration of intravenous contrast. CONTRAST:  72mL OMNIPAQUE IOHEXOL 300 MG/ML  SOLN COMPARISON:  March 14, 2020. FINDINGS: CT CHEST FINDINGS Cardiovascular: Atherosclerosis of thoracic aorta is noted without aneurysm formation. Normal cardiac size. No pericardial effusion. Coronary artery calcifications are noted. Mediastinum/Nodes: No enlarged mediastinal, hilar, or axillary lymph nodes. Thyroid gland, trachea, and esophagus demonstrate no significant findings. Lungs/Pleura: No pneumothorax or pleural effusion is noted. Left lower lobe opacity noted on prior exam is no longer visualized. Minimal emphysematous disease is noted bilaterally. 3 mm nodule is noted laterally in right upper lobe best seen on image number 41 of series 7. Musculoskeletal: No chest wall mass or suspicious bone lesions identified. CT ABDOMEN PELVIS FINDINGS Hepatobiliary: Liver is unremarkable. Moderate gallbladder distention is noted with dilated common bile duct and mild intrahepatic biliary dilatation. No cholelithiasis is noted. Pancreas: No inflammatory  changes noted. Stable pancreatic ductal dilatation is noted which is unchanged. Spleen: Normal in size without focal abnormality. Adrenals/Urinary Tract: Adrenal glands are unremarkable. Kidneys are normal, without renal calculi, focal lesion, or hydronephrosis. Bladder is unremarkable. Stomach/Bowel: Stomach is within normal limits. Appendix appears normal. No evidence of bowel wall thickening, distention, or inflammatory changes. Vascular/Lymphatic: Aortic atherosclerosis. No enlarged abdominal or pelvic lymph nodes. 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 2.4 cm left common iliac artery aneurysm is noted. 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 Reproductive: Status post hysterectomy. No adnexal masses. Other: No abdominal wall hernia or abnormality. No abdominopelvic ascites. Musculoskeletal: No acute or significant osseous findings. IMPRESSION: 3 mm nodule seen in right upper lobe. No follow-up needed if patient is low-risk. Non-contrast chest CT can be considered in 12 months if patient is high-risk. This recommendation follows the consensus statement: Guidelines for Management of Incidental Pulmonary Nodules Detected on CT Images: From the Fleischner Society 2017; Radiology 2017; 284:228-243. No cholelithiasis is noted. Moderate gallbladder distention is noted with dilated common bile duct and mild intrahepatic biliary dilatation. Correlation with liver function tests is recommended to rule out biliary obstruction. MRCP may be performed  for further evaluation. 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 Aortic Atherosclerosis (ICD10-I70.0). Electronically Signed   By: Marijo Conception M.D.   On: 12/28/2020 15:59   DG Chest Port 1 View  Result Date: 12/28/2020 CLINICAL DATA:  Chest pain. EXAM: PORTABLE CHEST 1 VIEW COMPARISON:  December 12, 2020. FINDINGS: Stable cardiomediastinal silhouette. Left-sided pacemaker is unchanged in  position. Both lungs are clear. The visualized skeletal structures are unremarkable. IMPRESSION: No active disease. Aortic Atherosclerosis (ICD10-I70.0). Electronically Signed   By: Marijo Conception M.D.   On: 12/28/2020 09:51   ECHOCARDIOGRAM COMPLETE  Result Date: 12/13/2020    ECHOCARDIOGRAM REPORT   Patient Name:   CHASELYNN DRAFT Date of Exam: 12/12/2020 Medical Rec #:  [id number]       Height:       65.0 in Accession #:    [id number]      Weight:       140.0 lb Date of Birth:  1945-12-22       BSA:          1.700 m Patient Age:    59 years        BP:           112/60 mmHg Patient Gender: F               HR:           63 bpm. Exam Location:  [facility] Procedure: 2D Echo, Cardiac Doppler and Color Doppler Indications:     I21.4 NSTEMI  History:         Patient has prior history of Echocardiogram examinations, most                  recent 05/19/2020. Hypertrophic Cardiomyopathy, Pacemaker; Risk                  Factors:Hypertension and Dyslipidemia.  Sonographer:     Cresenciano Lick RDCS Referring Phys:  [id number] Kate Sable Diagnosing Phys: Ida Rogue MD IMPRESSIONS  1. Left ventricular ejection fraction, by estimation, is 60 to 65%. The left ventricle has normal function. The left ventricle has no regional wall motion abnormalities. There is severe left ventricular hypertrophy. Left ventricular diastolic parameters  are indeterminate.  2. Right ventricular systolic function is normal. The right ventricular size is normal. Tricuspid regurgitation signal is inadequate for assessing PA pressure.  3. The mitral valve is normal in structure. Mild mitral valve regurgitation. No evidence of mitral stenosis.  4. The aortic valve is normal in structure. Aortic valve regurgitation is not visualized. Mild to moderate aortic valve sclerosis/calcification is present, without any evidence of aortic stenosis.  5. The inferior vena cava is normal in size with greater than 50% respiratory variability, suggesting  right atrial pressure of 3 mmHg. FINDINGS  Left Ventricle: Left ventricular ejection fraction, by estimation, is 60 to 65%. The left ventricle has normal function. The left ventricle has no regional wall motion abnormalities. The left ventricular internal cavity size was normal in size. There is  severe left ventricular hypertrophy. Left ventricular diastolic parameters are indeterminate. Right Ventricle: The right ventricular size is normal. No increase in right ventricular wall thickness. Right ventricular systolic function is normal. Tricuspid regurgitation signal is inadequate for assessing PA pressure. Left Atrium: Left atrial size was normal in size. Right Atrium: Right atrial size was normal in size. Pericardium: There is no evidence of pericardial effusion. Mitral Valve: The mitral valve is normal  in structure. Mild mitral annular calcification. Mild mitral valve regurgitation. No evidence of mitral valve stenosis. Tricuspid Valve: The tricuspid valve is normal in structure. Tricuspid valve regurgitation is not demonstrated. No evidence of tricuspid stenosis. Aortic Valve: The aortic valve is normal in structure. Aortic valve regurgitation is not visualized. Mild to moderate aortic valve sclerosis/calcification is present, without any evidence of aortic stenosis. Aortic valve mean gradient measures 6.0 mmHg. Aortic valve peak gradient measures 12.4 mmHg. Aortic valve area, by VTI measures 2.74 cm. Pulmonic Valve: The pulmonic valve was normal in structure. Pulmonic valve regurgitation is not visualized. No evidence of pulmonic stenosis. Aorta: The aortic root is normal in size and structure. Venous: The inferior vena cava is normal in size with greater than 50% respiratory variability, suggesting right atrial pressure of 3 mmHg. IAS/Shunts: No atrial level shunt detected by color flow Doppler.  LEFT VENTRICLE PLAX 2D LVIDd:         3.50 cm   Diastology LVIDs:         2.00 cm   LV e' medial:    4.68 cm/s LV  PW:         1.40 cm   LV E/e' medial:  13.3 LV IVS:        1.40 cm   LV e' lateral:   4.35 cm/s LVOT diam:     2.00 cm   LV E/e' lateral: 14.3 LV SV:         88 LV SV Index:   52 LVOT Area:     3.14 cm  RIGHT VENTRICLE RV Basal diam:  4.20 cm RV S prime:     9.48 cm/s TAPSE (M-mode): 2.6 cm LEFT ATRIUM             Index        RIGHT ATRIUM           Index LA diam:        3.70 cm 2.18 cm/m   RA Area:     10.10 cm LA Vol (A2C):   42.3 ml 24.88 ml/m  RA Volume:   22.60 ml  13.29 ml/m LA Vol (A4C):   38.2 ml 22.47 ml/m LA Biplane Vol: 42.4 ml 24.94 ml/m  AORTIC VALVE AV Area (Vmax):    2.83 cm AV Area (Vmean):   3.37 cm AV Area (VTI):     2.74 cm AV Vmax:           176.00 cm/s AV Vmean:          116.000 cm/s AV VTI:            0.323 m AV Peak Grad:      12.4 mmHg AV Mean Grad:      6.0 mmHg LVOT Vmax:         158.50 cm/s LVOT Vmean:        124.500 cm/s LVOT VTI:          0.282 m LVOT/AV VTI ratio: 0.87  AORTA Ao Root diam: 3.10 cm Ao Asc diam:  2.90 cm MITRAL VALVE MV Area (PHT): 2.54 cm     SHUNTS MV Decel Time: 299 msec     Systemic VTI:  0.28 m MV E velocity: 62.40 cm/s   Systemic Diam: 2.00 cm MV A velocity: 115.00 cm/s MV E/A ratio:  0.54 Julien Nordmann MD Electronically signed by Julien Nordmann MD Signature Date/Time: 12/13/2020/7:11:48 PM    Final    ECHOCARDIOGRAM LIMITED  Result Date: 12/30/2020  ECHOCARDIOGRAM LIMITED REPORT   Patient Name:   Shannon Chen Date of Exam: 12/30/2020 Medical Rec #:  [id number]       Height:       65.0 in Accession #:    [id number]      Weight:       140.2 lb Date of Birth:  [birth year]       BSA:          1.701 m Patient Age:    49 years        BP:           103/47 mmHg Patient Gender: F               HR:           81 bpm. Exam Location:  [facility] Procedure: 2D Echo, Cardiac Doppler and Color Doppler Indications:     NSTEMI I21.4  History:         Patient has prior history of Echocardiogram examinations.                  Cardiomyopathy; CAD.  Sonographer:      Alyse Low Roar Referring Phys:  [id number] CHRISTOPHER RONALD BERGE Diagnosing Phys: Fransico Him MD IMPRESSIONS  1. Left ventricular ejection fraction, by estimation, is 60 to 65%. The left ventricle has normal function. The left ventricle has no regional wall motion abnormalities. There is severe left ventricular hypertrophy of the basal-septal segment. Left ventricular diastolic parameters are consistent with Grade I diastolic dysfunction (impaired relaxation). Elevated left ventricular end-diastolic pressure.  2. Right ventricular systolic function is normal. The right ventricular size is normal.  3. The mitral valve is normal in structure. Trivial mitral valve regurgitation. No evidence of mitral stenosis.  4. The aortic valve is calcified. Aortic valve regurgitation is not visualized. Mild aortic valve stenosis. Aortic valve area, by VTI measures 1.45 cm. Aortic valve mean gradient measures 11.5 mmHg. Aortic valve Vmax measures 2.45 m/s. FINDINGS  Left Ventricle: Left ventricular ejection fraction, by estimation, is 60 to 65%. The left ventricle has normal function. The left ventricle has no regional wall motion abnormalities. The left ventricular internal cavity size was normal in size. There is  severe left ventricular hypertrophy of the basal-septal segment. Left ventricular diastolic parameters are consistent with Grade I diastolic dysfunction (impaired relaxation). Elevated left ventricular end-diastolic pressure. Right Ventricle: The right ventricular size is normal. No increase in right ventricular wall thickness. Right ventricular systolic function is normal. Left Atrium: Left atrial size was normal in size. Right Atrium: Right atrial size was normal in size. Pericardium: There is no evidence of pericardial effusion. Mitral Valve: The mitral valve is normal in structure. Mild mitral annular calcification. Trivial mitral valve regurgitation. No evidence of mitral valve stenosis. Tricuspid Valve: The tricuspid  valve is normal in structure. Tricuspid valve regurgitation is trivial. No evidence of tricuspid stenosis. Aortic Valve: The aortic valve is calcified. Aortic valve regurgitation is not visualized. Mild aortic stenosis is present. Aortic valve mean gradient measures 11.5 mmHg. Aortic valve peak gradient measures 23.9 mmHg. Aortic valve area, by VTI measures 1.45 cm. Pulmonic Valve: The pulmonic valve was normal in structure. Pulmonic valve regurgitation is trivial. No evidence of pulmonic stenosis. Aorta: The aortic root is normal in size and structure. Venous: The inferior vena cava was not well visualized. IAS/Shunts: The interatrial septum appears to be lipomatous. No atrial level shunt detected by color flow Doppler. Additional Comments: A device lead is visualized. LEFT VENTRICLE  PLAX 2D LVIDd:         4.70 cm   Diastology LVIDs:         3.10 cm   LV e' medial:    4.74 cm/s LV PW:         1.20 cm   LV E/e' medial:  15.5 LV IVS:        1.60 cm   LV e' lateral:   3.99 cm/s LVOT diam:     1.70 cm   LV E/e' lateral: 18.5 LV SV:         68 LV SV Index:   40 LVOT Area:     2.27 cm  RIGHT VENTRICLE RV Mid diam:    3.10 cm RV S prime:     11.60 cm/s TAPSE (M-mode): 2.4 cm LEFT ATRIUM             Index        RIGHT ATRIUM           Index LA diam:        3.60 cm 2.12 cm/m   RA Area:     11.90 cm LA Vol (A2C):   49.7 ml 29.22 ml/m  RA Volume:   25.00 ml  14.70 ml/m LA Vol (A4C):   39.4 ml 23.16 ml/m LA Biplane Vol: 44.9 ml 26.40 ml/m  AORTIC VALVE                     PULMONIC VALVE AV Area (Vmax):    1.37 cm      PV Vmax:          0.96 m/s AV Area (Vmean):   1.37 cm      PV Peak grad:     3.7 mmHg AV Area (VTI):     1.45 cm      PR End Diast Vel: 2.84 msec AV Vmax:           244.50 cm/s   RVOT Peak grad:   2 mmHg AV Vmean:          153.000 cm/s AV VTI:            0.470 m AV Peak Grad:      23.9 mmHg AV Mean Grad:      11.5 mmHg LVOT Vmax:         148.00 cm/s LVOT Vmean:        92.300 cm/s LVOT VTI:           0.301 m LVOT/AV VTI ratio: 0.64  AORTA Ao Root diam: 2.30 cm MITRAL VALVE               TRICUSPID VALVE MV Area (PHT): 2.82 cm    TR Peak grad:   29.4 mmHg MV Decel Time: 269 msec    TR Vmax:        271.00 cm/s MV E velocity: 73.70 cm/s MV A velocity: 97.30 cm/s  SHUNTS MV E/A ratio:  0.76        Systemic VTI:  0.30 m MV A Prime:    6.7 cm/s    Systemic Diam: 1.70 cm Fransico Him MD Electronically signed by Fransico Him MD Signature Date/Time: 12/30/2020/8:13:18 PM    Final    Results for orders placed or performed during the hospital encounter of 12/28/20  Blood Culture (routine x 2)     Status: None   Collection Time: 12/28/20  9:43 AM   Specimen: BLOOD  Result Value Ref Range  Status   Specimen Description BLOOD BLOOD RIGHT HAND  Final   Special Requests   Final    BOTTLES DRAWN AEROBIC AND ANAEROBIC Blood Culture adequate volume   Culture   Final    NO GROWTH 5 DAYS Performed at [facility], [address] [place], [place] [address]    Report Status 01/02/2021 FINAL  Final  Blood Culture (routine x 2)     Status: None   Collection Time: 12/28/20  9:58 AM   Specimen: BLOOD  Result Value Ref Range Status   Specimen Description BLOOD BLOOD LEFT HAND  Final   Special Requests   Final    BOTTLES DRAWN AEROBIC AND ANAEROBIC Blood Culture adequate volume   Culture   Final    NO GROWTH 5 DAYS Performed at [facility], [address] [place], [place] [address]    Report Status 01/02/2021 FINAL  Final  Resp Panel by RT-PCR (Flu A&B, Covid) Nasopharyngeal Swab     Status: None   Collection Time: 12/28/20 12:21 PM   Specimen: Nasopharyngeal Swab; Nasopharyngeal(NP) swabs in vial transport medium  Result Value Ref Range Status   SARS Coronavirus 2 by RT PCR NEGATIVE NEGATIVE Final    Comment: (NOTE) SARS-CoV-2 target nucleic acids are NOT DETECTED.  The SARS-CoV-2 RNA is generally detectable in upper respiratory specimens during the acute phase of infection. The  lowest concentration of SARS-CoV-2 viral copies this assay can detect is 138 copies/mL. A negative result does not preclude SARS-Cov-2 infection and should not be used as the sole basis for treatment or other patient management decisions. A negative result may occur with  improper specimen collection/handling, submission of specimen other than nasopharyngeal swab, presence of viral mutation(s) within the areas targeted by this assay, and inadequate number of viral copies(<138 copies/mL). A negative result must be combined with clinical observations, patient history, and epidemiological information. The expected result is Negative.  Fact Sheet for Patients:  EntrepreneurPulse.com.au  Fact Sheet for Healthcare Providers:  IncredibleEmployment.be  This test is no t yet approved or cleared by the [place] FDA and  has been authorized for detection and/or diagnosis of SARS-CoV-2 by FDA under an Emergency Use Authorization (EUA). This EUA will remain  in effect (meaning this test can be used) for the duration of the COVID-19 declaration under Section 564(b)(1) of the Act, 21 U.S.C.section 360bbb-3(b)(1), unless the authorization is terminated  or revoked sooner.       Influenza A by PCR NEGATIVE NEGATIVE Final   Influenza B by PCR NEGATIVE NEGATIVE Final    Comment: (NOTE) The Xpert Xpress SARS-CoV-2/FLU/RSV plus assay is intended as an aid in the diagnosis of influenza from Nasopharyngeal swab specimens and should not be used as a sole basis for treatment. Nasal washings and aspirates are unacceptable for Xpert Xpress SARS-CoV-2/FLU/RSV testing.  Fact Sheet for Patients: EntrepreneurPulse.com.au  Fact Sheet for Healthcare Providers: IncredibleEmployment.be  This test is not yet approved or cleared by the [place] FDA and has been authorized for detection and/or diagnosis of SARS-CoV-2 by FDA under  an Emergency Use Authorization (EUA). This EUA will remain in effect (meaning this test can be used) for the duration of the COVID-19 declaration under Section 564(b)(1) of the Act, 21 U.S.C. section 360bbb-3(b)(1), unless the authorization is terminated or revoked.  Performed at [facility], [address] [place], [place] [address]   Urine Culture     Status: Abnormal   Collection Time: 12/28/20 12:24 PM  Specimen: In/Out Cath Urine  Result Value Ref Range Status   Specimen Description   Final    IN/OUT CATH URINE Performed at [facility], [address], [place], [place] [address]    Special Requests   Final    NONE Performed at [facility], [address] [place], [place] [address]    Culture MULTIPLE SPECIES PRESENT, SUGGEST RECOLLECTION (A)  Final   Report Status 12/30/2020 FINAL  Final    Signed:  Lynden Oxford MD.  [facility] 01/04/2021, 8:10 AM

## 2021-01-31 ENCOUNTER — Ambulatory Visit: Payer: Medicare HMO

## 2021-01-31 ENCOUNTER — Other Ambulatory Visit: Payer: Self-pay | Admitting: Cardiovascular Disease

## 2021-01-31 NOTE — Telephone Encounter (Signed)
Please advise if ok to refill. Pt taking Amlodipine 5 mg qd.

## 2021-02-01 NOTE — Addendum Note (Signed)
Addended by: Theola Sequin on: 02/01/2021 04:06 PM   Modules accepted: Orders

## 2021-04-08 ENCOUNTER — Other Ambulatory Visit: Payer: Self-pay

## 2021-04-08 ENCOUNTER — Emergency Department
Admission: EM | Admit: 2021-04-08 | Discharge: 2021-04-08 | Disposition: A | Discharge status: Home / Self Care | Payer: Medicare HMO | Attending: Emergency Medicine | Admitting: Emergency Medicine

## 2021-04-08 DIAGNOSIS — R002 Palpitations: Secondary | ICD-10-CM | POA: Diagnosis not present

## 2021-04-08 DIAGNOSIS — R42 Dizziness and giddiness: Secondary | ICD-10-CM | POA: Diagnosis not present

## 2021-04-08 DIAGNOSIS — Z950 Presence of cardiac pacemaker: Secondary | ICD-10-CM | POA: Diagnosis not present

## 2021-04-08 DIAGNOSIS — I2510 Atherosclerotic heart disease of native coronary artery without angina pectoris: Secondary | ICD-10-CM | POA: Diagnosis not present

## 2021-04-08 DIAGNOSIS — R748 Abnormal levels of other serum enzymes: Secondary | ICD-10-CM | POA: Diagnosis not present

## 2021-04-08 LAB — BASIC METABOLIC PANEL
Anion gap: 7 (ref 5–15)
BUN: 29 mg/dL — ABNORMAL HIGH (ref 8–23)
CO2: 23 mmol/L (ref 22–32)
Calcium: 9.1 mg/dL (ref 8.9–10.3)
Chloride: 109 mmol/L (ref 98–111)
Creatinine, Ser: 1.28 mg/dL — ABNORMAL HIGH (ref 0.44–1.00)
GFR, Estimated: 43 mL/min — ABNORMAL LOW (ref >60)
Glucose, Bld: 126 mg/dL — ABNORMAL HIGH (ref 70–99)
Potassium: 4.5 mmol/L (ref 3.5–5.1)
Sodium: 139 mmol/L (ref 135–145)

## 2021-04-08 LAB — URINALYSIS, ROUTINE W REFLEX MICROSCOPIC
Bilirubin Urine: NEGATIVE
Glucose, UA: >=500 mg/dL — AB
Ketones, ur: NEGATIVE mg/dL
Leukocytes,Ua: NEGATIVE
Nitrite: NEGATIVE
Protein, ur: 100 mg/dL — AB
Specific Gravity, Urine: 1.021 (ref 1.005–1.030)
pH: 5 (ref 5.0–8.0)

## 2021-04-08 LAB — CBC
HCT: 41.1 % (ref 36.0–46.0)
Hemoglobin: 12.5 g/dL (ref 12.0–15.0)
MCH: 25.8 pg — ABNORMAL LOW (ref 26.0–34.0)
MCHC: 30.4 g/dL (ref 30.0–36.0)
MCV: 84.9 fL (ref 80.0–100.0)
Platelets: 175 10*3/uL (ref 150–400)
RBC: 4.84 MIL/uL (ref 3.87–5.11)
RDW: 14.6 % (ref 11.5–15.5)
WBC: 4.6 10*3/uL (ref 4.0–10.5)
nRBC: 0 % (ref 0.0–0.2)

## 2021-04-08 LAB — TROPONIN I (HIGH SENSITIVITY)
Troponin I (High Sensitivity): 120 ng/L (ref <18)
Troponin I (High Sensitivity): 129 ng/L (ref <18)

## 2021-04-08 NOTE — ED Provider Notes (Incomplete)
[facility] Provider Note    Event Date/Time   First MD Initiated Contact with Patient 04/08/21 1023     (approximate)   History   Dizziness and Palpitations   HPI  Shannon Chen is a 76 y.o. female  who, per cardiology note dated 01/08/2021 has history of CAD, pacemaker, presents to the emergency department today because of concern for an episode of dizziness.  The patient states she was walking when it occurred.  She suddenly felt dizzy.  It was accompanied by palpitations.  She denies any associated chest pain however.  Denies any shortness of breath.  She states the whole episode lasted roughly 5 minutes.  At the time my exam the patient states she is feeling improved.    Physical Exam   Triage Vital Signs: ED Triage Vitals  Enc Vitals Group     BP 04/08/21 0955 125/68     Pulse Rate 04/08/21 0955 71     Resp 04/08/21 0955 19     Temp --      Temp src --      SpO2 04/08/21 0955 96 %     Weight 04/08/21 0952 158 lb 11.7 oz (72 kg)     Height 04/08/21 0952 5\' 5"  (1.651 m)     Head Circumference --      Peak Flow --      Pain Score 04/08/21 0952 0   Most recent vital signs: Vitals:   04/08/21 0955  BP: 125/68  Pulse: 71  Resp: 19  SpO2: 96%    General: Awake, no distress.  CV:  Good peripheral perfusion. Regular rate.  Resp:  Normal effort. Clear lung sounds. Abd:  No distention.  MSK:  No lower extremity edema.   ED Results / Procedures / Treatments   Labs (all labs ordered are listed, but only abnormal results are displayed) Labs Reviewed  CBC - Abnormal; Notable for the following components:      Result Value   MCH 25.8 (*)    All other components within normal limits  BASIC METABOLIC PANEL  URINALYSIS, ROUTINE W REFLEX MICROSCOPIC  CBG MONITORING, ED  TROPONIN I (HIGH SENSITIVITY)     EKG  I, Nance Pear, attending physician, personally viewed and interpreted this EKG  EKG Time: 0952 Rate: 68 Rhythm: sinus  rhythm Axis: normal Intervals: qtc 475 QRS: narrow ST changes: no st elevation, t wave inversion v2-v6, I, II, avl Impression: abnormal ekg, artifact limits interpretation. T wave inversions present on ekg dated 02/01/21    RADIOLOGY *** {You MUST document your own interpretation of imaging, as well as the fact that you reviewed the radiologist's report!:1}   PROCEDURES:  Critical Care performed: {CriticalCareYesNo:19197::"Yes, see critical care procedure note(s)","No"}  Procedures   MEDICATIONS ORDERED IN ED: Medications - No data to display   IMPRESSION / MDM / [place] / ED COURSE  I reviewed the triage vital signs and the nursing notes.                              Differential diagnosis includes, but is not limited to, ***  {If the patient is on the monitor, remove the brackets and asterisks on the sentence below and remember to document it as a Procedure as well. Otherwise delete the sentence below:1} {**The patient is on the cardiac monitor to evaluate for evidence of arrhythmia and/or significant heart rate changes.**} {Remember to  include, when applicable, any/all of the following data: independent review of imaging independent review of labs (comment specifically on pertinent positives and negatives) review of specific prior hospitalizations, PCP/specialist notes, etc. discuss meds given and prescribed document any discussion with consultants (including hospitalists) any clinical decision tools you used and why (PECARN, NEXUS, etc.) did you consider admitting the patient? document social determinants of health affecting patient's care (homelessness, inability to follow up in a timely fashion, etc) document any pre-existing conditions increasing risk on current visit (e.g. diabetes and HTN increasing danger of high-risk chest pain/ACS) describes what meds you gave (especially parenteral) and why any other interventions?:1}     FINAL CLINICAL  IMPRESSION(S) / ED DIAGNOSES   Final diagnoses:  None     Rx / DC Orders   ED Discharge Orders     None        Note:  This document was prepared using Dragon voice recognition software and may include unintentional dictation errors.

## 2021-04-08 NOTE — Discharge Instructions (Signed)
Please seek medical attention for any high fevers, chest pain, shortness of breath, change in behavior, persistent vomiting, bloody stool or any other new or concerning symptoms.  

## 2021-04-08 NOTE — ED Notes (Signed)
[facility] contacted of pt's discharge and provide transportation

## 2021-04-08 NOTE — ED Notes (Signed)
Pt assisted to the bathroom.

## 2021-04-08 NOTE — ED Triage Notes (Signed)
Pt to ED via [facility] from [facility]. Pt was going to get breakfast when she started to have palpitations and felt dizzy. Symptoms resolved prior to FD and EMS arrival. 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 Pt reports no complaints at this time.

## 2021-04-08 NOTE — ED Notes (Signed)
Pt's pacemaker interrogated at this time.

## 2021-04-08 NOTE — ED Provider Notes (Signed)
[facility] Provider Note    Event Date/Time   First MD Initiated Contact with Patient 04/08/21 1023     (approximate)   History   Dizziness and Palpitations   HPI  Shannon Chen is a 76 y.o. female  who, per cardiology note dated  01/08/2021 has history of CAD and pacemaker, presents to the emergency department today because of concern for an episode of dizziness and palpitations.  Patient states this happened while she was walking.  It only last about 5 minutes.  She states she has had similar symptoms in the past.  At the time my exam she denies any further symptoms. Denies any recent illness or fevers.    Physical Exam   Triage Vital Signs: ED Triage Vitals  Enc Vitals Group     BP 04/08/21 0955 125/68     Pulse Rate 04/08/21 0955 71     Resp 04/08/21 0955 19     Temp --      Temp src --      SpO2 04/08/21 0955 96 %     Weight 04/08/21 0952 158 lb 11.7 oz (72 kg)     Height 04/08/21 0952 5\' 5"  (1.651 m)     Head Circumference --      Peak Flow --      Pain Score 04/08/21 0952 0     Pain Loc --      Pain Edu? --      Excl. in GC? --     Most recent vital signs: Vitals:   04/08/21 0955  BP: 125/68  Pulse: 71  Resp: 19  SpO2: 96%    General: Awake, no distress.  CV:  Good peripheral perfusion. Regular rate.  Resp:  Normal effort. Lungs clear to auscultation Abd:  No distention.    ED Results / Procedures / Treatments   Labs (all labs ordered are listed, but only abnormal results are displayed) Labs Reviewed  BASIC METABOLIC PANEL - Abnormal; Notable for the following components:      Result Value   Glucose, Bld 126 (*)    BUN 29 (*)    Creatinine, Ser 1.28 (*)    GFR, Estimated 43 (*)    All other components within normal limits  CBC - Abnormal; Notable for the following components:   MCH 25.8 (*)    All other components within normal limits  URINALYSIS, ROUTINE W REFLEX MICROSCOPIC - Abnormal; Notable for the following  components:   Color, Urine YELLOW (*)    APPearance CLOUDY (*)    Glucose, UA >=500 (*)    Hgb urine dipstick MODERATE (*)    Protein, ur 100 (*)    Bacteria, UA RARE (*)    All other components within normal limits  TROPONIN I (HIGH SENSITIVITY) - Abnormal; Notable for the following components:   Troponin I (High Sensitivity) 120 (*)    All other components within normal limits  TROPONIN I (HIGH SENSITIVITY) - Abnormal; Notable for the following components:   Troponin I (High Sensitivity) 129 (*)    All other components within normal limits  CBG MONITORING, ED     EKG  I, 04/10/21, attending physician, personally viewed and interpreted this EKG  EKG Time: 0952 Rate: 68 Rhythm: sinus rhythm Axis: normal Intervals: qtc 475 QRS: narrow ST changes: no st elevation, t wave inversion v2-v6, I, II, avl Impression: abnormal ekg, artifact limits interpretation. T wave inversions present on ekg dated 02/01/21  RADIOLOGY None   PROCEDURES:  Critical Care performed: No  Procedures   MEDICATIONS ORDERED IN ED: Medications - No data to display   IMPRESSION / MDM / ASSESSMENT AND PLAN / ED COURSE  I reviewed the triage vital signs and the nursing notes.                              Differential diagnosis includes, but is not limited to, arrhythmia, ACS, vertigo amongst other etiologies.  Patient presented to the emergency department today because of brief episode of palpitations and dizziness.  Patient states last about 5 minutes and had resolved by the time my exam.  EKG here without concerning arrhythmia.  Pacemaker was interrogated without any concerning arrhythmias noted today.  Patient does have a history of coronary artery disease and initial troponin was elevated however based on record review she tends to have elevated troponins.  I did repeat her troponin and there was no significant change.  While I did consider admission for elevated troponin, at this  point I think this is most likely her baseline and not significant for acute injury.  I did discuss this with the patient.  She stated she did feel better and wanted to go home and I think this is completely reasonable.   FINAL CLINICAL IMPRESSION(S) / ED DIAGNOSES   Final diagnoses:  Palpitations  Dizziness     Rx / DC Orders   ED Discharge Orders     None        Note:  This document was prepared using Dragon voice recognition software and may include unintentional dictation errors.    Phineas Semen, MD 04/08/21 [phone]

## 2021-04-10 LAB — URINE CULTURE

## 2021-04-11 ENCOUNTER — Ambulatory Visit (INDEPENDENT_AMBULATORY_CARE_PROVIDER_SITE_OTHER): Payer: Medicare HMO | Admitting: Podiatry

## 2021-04-11 ENCOUNTER — Other Ambulatory Visit: Payer: Self-pay

## 2021-04-11 ENCOUNTER — Encounter: Payer: Self-pay | Admitting: Podiatry

## 2021-04-11 DIAGNOSIS — B351 Tinea unguium: Secondary | ICD-10-CM | POA: Diagnosis not present

## 2021-04-11 DIAGNOSIS — N179 Acute kidney failure, unspecified: Secondary | ICD-10-CM | POA: Diagnosis not present

## 2021-04-11 DIAGNOSIS — M79675 Pain in left toe(s): Secondary | ICD-10-CM

## 2021-04-11 DIAGNOSIS — E1142 Type 2 diabetes mellitus with diabetic polyneuropathy: Secondary | ICD-10-CM

## 2021-04-11 DIAGNOSIS — M79674 Pain in right toe(s): Secondary | ICD-10-CM

## 2021-04-11 NOTE — Progress Notes (Signed)
This patient returns to my office for at risk foot care.  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  This patient is unable to cut nails herself since the patient cannot reach her nails.These nails are painful walking and wearing shoes.  This patient presents for at risk foot care today.  General Appearance  Alert, conversant and in no acute stress.  Vascular  Dorsalis pedis and posterior tibial  pulses are absent  bilaterally.  Capillary return is within normal limits  bilaterally. Cold feet.    Bilaterally. Absent digital hair  B/L.  Neurologic  Senn-Weinstein monofilament wire test absent  bilaterally. .  Nails Thick disfigured discolored nails with subungual debris  from hallux to fifth toes bilaterally. No evidence of bacterial infection or drainage bilaterally.  Orthopedic  No limitations of motion  feet .  No crepitus or effusions noted.  No bony pathology or digital deformities noted.  Skin  normotropic skin with no porokeratosis noted bilaterally.  No signs of infections or ulcers noted.     Onychomycosis  Pain in right toes  Pain in left toes  Consent was obtained for treatment procedures.   Mechanical debridement of nails 1-5  bilaterally performed with a nail nipper.  Filed with dremel without incident.    Return office visit   3   months                   Told patient to return for periodic foot care and evaluation due to potential at risk complications.   Deedee Lybarger DPM  
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
## 2021-06-18 ENCOUNTER — Ambulatory Visit (INDEPENDENT_AMBULATORY_CARE_PROVIDER_SITE_OTHER): Payer: Medicare HMO | Admitting: Internal Medicine

## 2021-06-18 ENCOUNTER — Encounter: Payer: Self-pay | Admitting: Cardiovascular Disease

## 2021-06-18 ENCOUNTER — Ambulatory Visit (INDEPENDENT_AMBULATORY_CARE_PROVIDER_SITE_OTHER): Payer: Medicare HMO | Admitting: Cardiovascular Disease

## 2021-06-18 VITALS — BP 140/78 | HR 69 | Ht 65.5 in | Wt 147.2 lb

## 2021-06-18 DIAGNOSIS — I441 Atrioventricular block, second degree: Secondary | ICD-10-CM

## 2021-06-18 DIAGNOSIS — I10 Essential (primary) hypertension: Secondary | ICD-10-CM

## 2021-06-18 DIAGNOSIS — Z950 Presence of cardiac pacemaker: Secondary | ICD-10-CM | POA: Diagnosis not present

## 2021-06-18 DIAGNOSIS — E785 Hyperlipidemia, unspecified: Secondary | ICD-10-CM

## 2021-06-18 DIAGNOSIS — I25118 Atherosclerotic heart disease of native coronary artery with other forms of angina pectoris: Secondary | ICD-10-CM

## 2021-06-18 DIAGNOSIS — E1169 Type 2 diabetes mellitus with other specified complication: Secondary | ICD-10-CM

## 2021-06-18 DIAGNOSIS — I6523 Occlusion and stenosis of bilateral carotid arteries: Secondary | ICD-10-CM

## 2021-06-18 DIAGNOSIS — I739 Peripheral vascular disease, unspecified: Secondary | ICD-10-CM | POA: Diagnosis not present

## 2021-06-18 NOTE — Patient Instructions (Signed)
Medication Instructions:  ?No changes ? ?If you need a refill on your cardiac medications before your next appointment, please call your pharmacy.  ? ?Lab work: ?No new labs needed ? ?Testing/Procedures: ?No new testing needed ? ?Follow-Up: ?At [facility], you and your health needs are our priority.  As part of our continuing mission to provide you with exceptional heart care, we have created designated Provider Care Teams.  These Care Teams include your primary Cardiologist (physician) and Advanced Practice Providers (APPs -  Physician Assistants and Nurse Practitioners) who all work together to provide you with the care you need, when you need it. ? ?You will need a follow up appointment in 6 months, APP ok ? ?Providers on your designated Care Team:   ?Christopher Berge, NP ?Ryan Dunn, PA-C ?Cadence Furth, PA-C ? ?COVID-19 Vaccine Information can be found at: [email or web address] For questions related to vaccine distribution or appointments, please email [email or web address] or call [phone].  ? ?

## 2021-06-19 LAB — CUP PACEART INCLINIC DEVICE CHECK
Battery Remaining Longevity: 118 mo
Battery Voltage: 3 V
Brady Statistic AP VP Percent: 18.13 %
Brady Statistic AP VS Percent: 2.24 %
Brady Statistic AS VP Percent: 22.58 %
Brady Statistic AS VS Percent: 57.05 %
Brady Statistic RA Percent Paced: 20.28 %
Brady Statistic RV Percent Paced: 40.71 %
Date Time Interrogation Session: 20230502113100
Implantable Lead Implant Date: 20180822
Implantable Lead Implant Date: 20180822
Implantable Lead Location: 753859
Implantable Lead Location: 753860
Implantable Lead Model: 3830
Implantable Lead Model: 5076
Implantable Pulse Generator Implant Date: 20180822
Lead Channel Impedance Value: 342 Ohm
Lead Channel Impedance Value: 494 Ohm
Lead Channel Impedance Value: 551 Ohm
Lead Channel Impedance Value: 608 Ohm
Lead Channel Pacing Threshold Amplitude: 0.75 V
Lead Channel Pacing Threshold Amplitude: 1.5 V
Lead Channel Pacing Threshold Pulse Width: 0.4 ms
Lead Channel Pacing Threshold Pulse Width: 0.4 ms
Lead Channel Sensing Intrinsic Amplitude: 5.875 mV
Lead Channel Sensing Intrinsic Amplitude: 5.875 mV
Lead Channel Setting Pacing Amplitude: 2 V
Lead Channel Setting Pacing Amplitude: 2.5 V
Lead Channel Setting Pacing Pulse Width: 0.4 ms
Lead Channel Setting Sensing Sensitivity: 2 mV

## 2021-06-19 NOTE — Progress Notes (Signed)
Pacemaker check in clinic. Normal device function. Thresholds, sensing, impedances consistent with previous measurements. Device programmed to maximize longevity. 3 NSVT episodes noted with longest 19 beats. Device programmed at appropriate safety margins. Histogram distribution appropriate for patient activity level. Device programmed to optimize intrinsic conduction. Estimated longevity 9 years, 10 months. Patient is not enrolled in remote monitoring. Recall for Dr. [name] 10/25/21. Patient made aware device will be checked during that appointment. Patient education completed.

## 2021-07-03 NOTE — Telephone Encounter (Signed)
error 

## 2021-07-11 ENCOUNTER — Encounter: Payer: Self-pay | Admitting: Podiatry

## 2021-07-11 ENCOUNTER — Ambulatory Visit (INDEPENDENT_AMBULATORY_CARE_PROVIDER_SITE_OTHER): Payer: Medicare HMO | Admitting: Podiatry

## 2021-07-11 DIAGNOSIS — B351 Tinea unguium: Secondary | ICD-10-CM | POA: Diagnosis not present

## 2021-07-11 DIAGNOSIS — M79675 Pain in left toe(s): Secondary | ICD-10-CM

## 2021-07-11 DIAGNOSIS — E1142 Type 2 diabetes mellitus with diabetic polyneuropathy: Secondary | ICD-10-CM | POA: Diagnosis not present

## 2021-07-11 DIAGNOSIS — M79674 Pain in right toe(s): Secondary | ICD-10-CM

## 2021-07-11 NOTE — Progress Notes (Signed)
This patient returns to my office for at risk foot care.  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  This patient is unable to cut nails herself since the patient cannot reach her nails.These nails are painful walking and wearing shoes.  This patient presents for at risk foot care today.  General Appearance  Alert, conversant and in no acute stress.  Vascular  Dorsalis pedis and posterior tibial  pulses are absent  bilaterally.  Capillary return is within normal limits  bilaterally. Cold feet.    Bilaterally. Absent digital hair  B/L.  Neurologic  Senn-Weinstein monofilament wire test absent  bilaterally. .  Nails Thick disfigured discolored nails with subungual debris  from hallux to fifth toes bilaterally. No evidence of bacterial infection or drainage bilaterally.  Orthopedic  No limitations of motion  feet .  No crepitus or effusions noted.  No bony pathology or digital deformities noted.  Skin  normotropic skin with no porokeratosis noted bilaterally.  No signs of infections or ulcers noted.     Onychomycosis  Pain in right toes  Pain in left toes  Consent was obtained for treatment procedures.   Mechanical debridement of nails 1-5  bilaterally performed with a nail nipper.  Filed with dremel without incident.    Return office visit   3   months                   Told patient to return for periodic foot care and evaluation due to potential at risk complications.   Daylynn Stumpp DPM  

## 2021-08-15 IMAGING — CT CT HEAD W/O CM
4 series · 17 of 47 positions shown, 19 images · non-contrast
Comparison: April 24, 2018

CLINICAL DATA: Syncopal episodes

EXAM:
CT HEAD WITHOUT CONTRAST
TECHNIQUE: Contiguous axial images were obtained from the base of the skull
through the vertex without intravenous contrast.

[Series 2: head wo · axial · 0.43mm/px · z∈[-129,-14]mm · 6 of 33 slices shown, 8 images]
[im 5/33  brain]
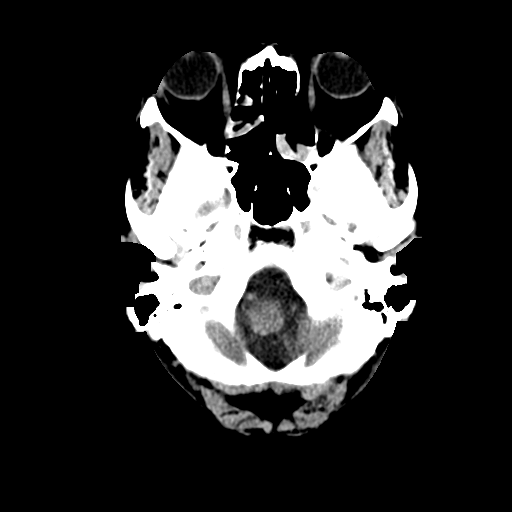
[im 5/33  bone]
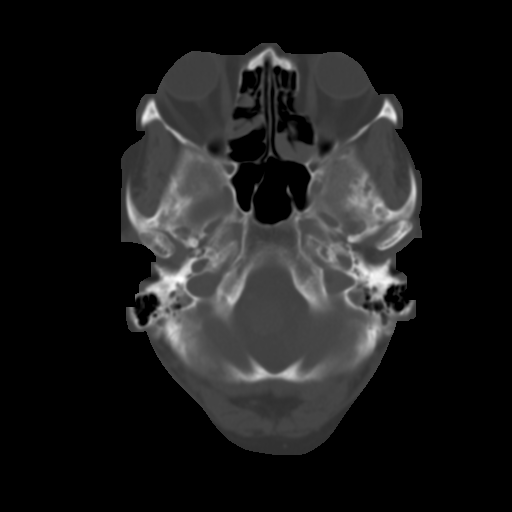
[im 10/33  brain]
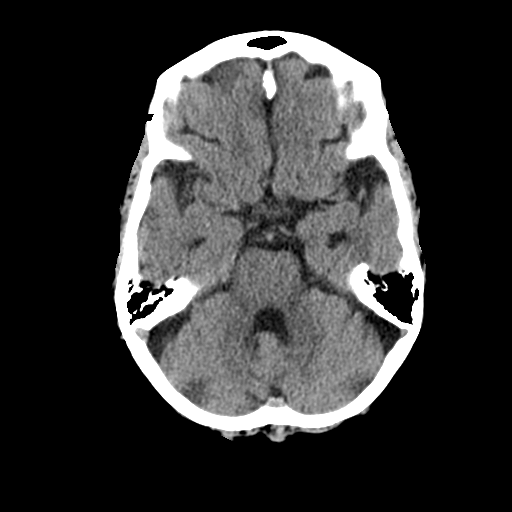
[im 14/33  brain]
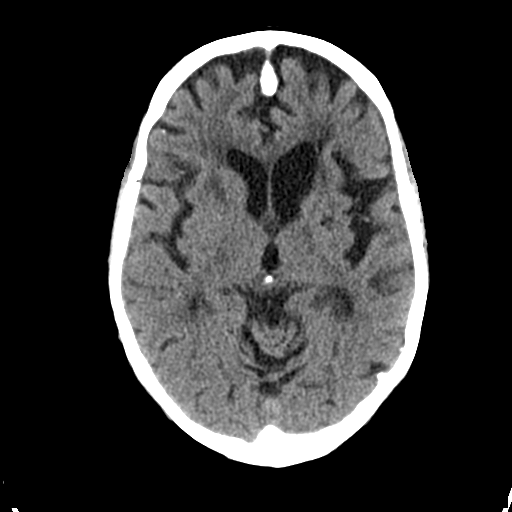
[im 19/33  brain]
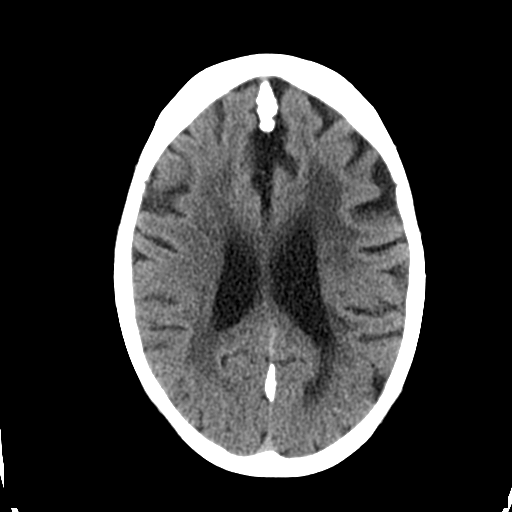
[im 23/33  brain]
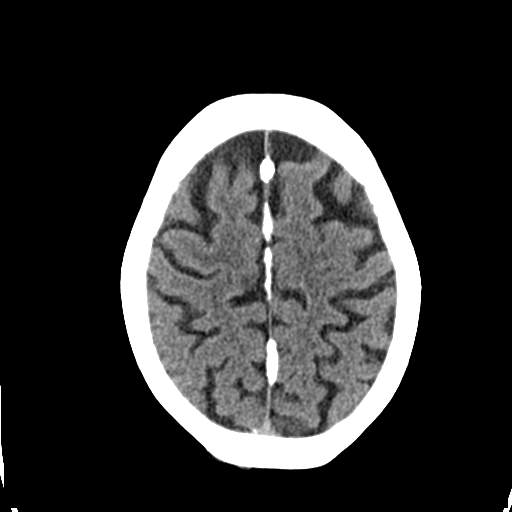
[im 23/33  bone]
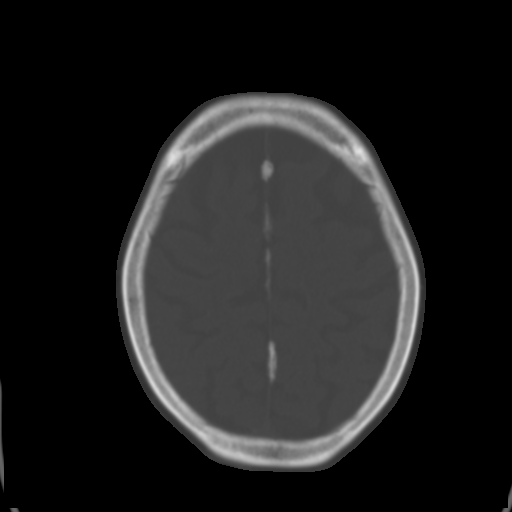
[im 28/33  brain]
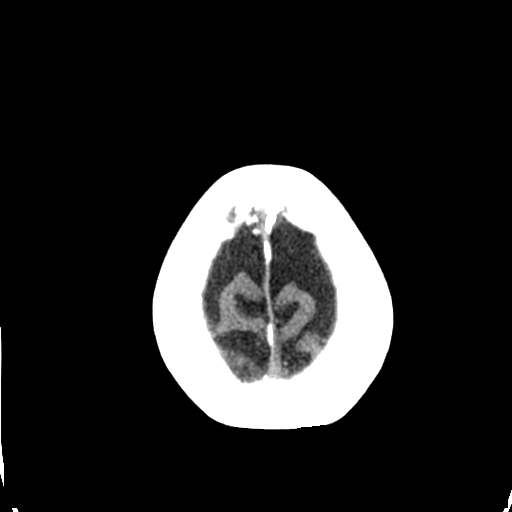

[Series 3: head bone · axial · 0.43mm/px · z∈[-133,-53]mm · 5 of 85 slices shown]
[im 9/85  bone]
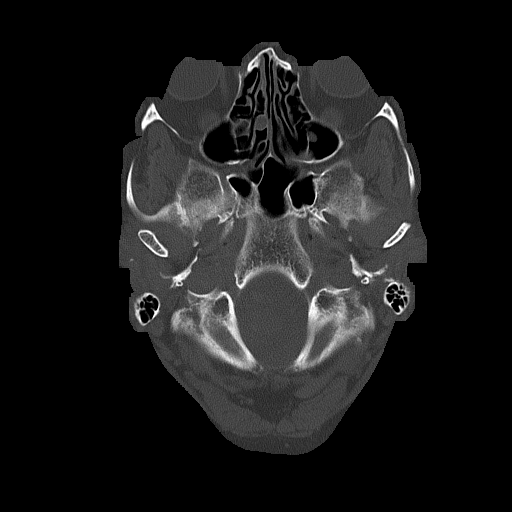
[im 17/85  bone]
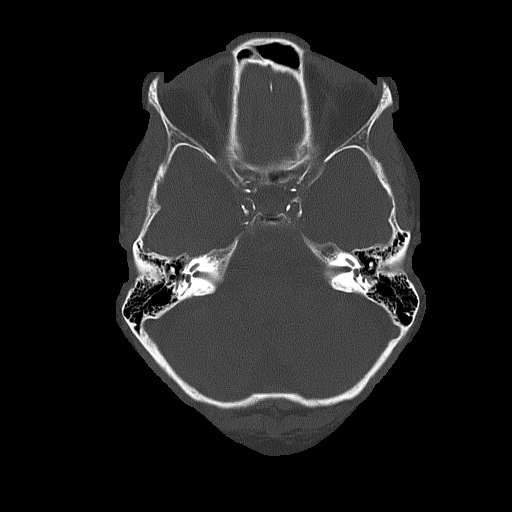
[im 29/85  bone]
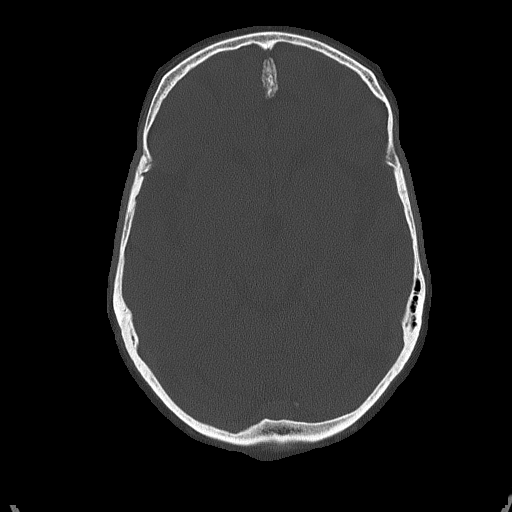
[im 37/85  bone]
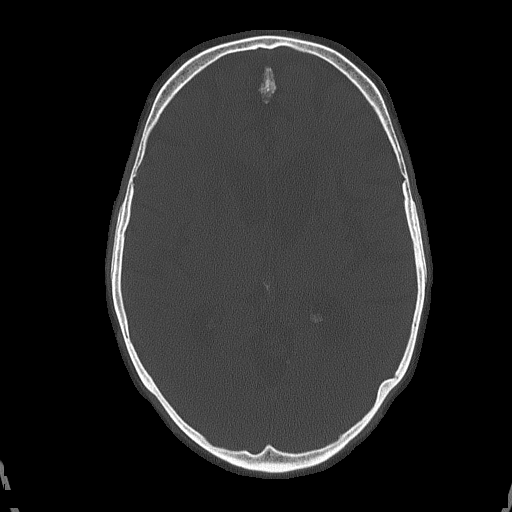
[im 49/85  bone]
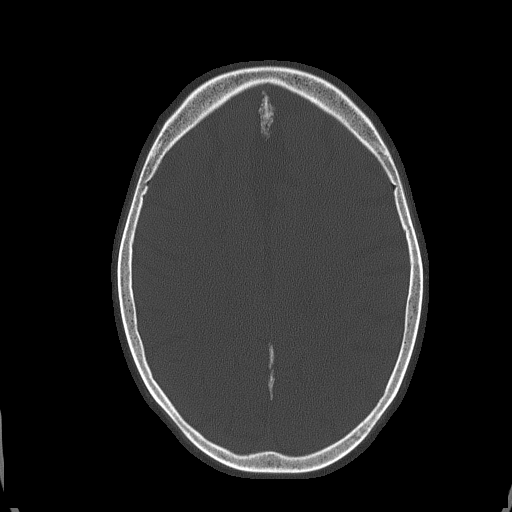

[Series 4: coronal soft tissue · coronal · 0.33mm/px · 3 of 73 slices shown]
[im 25/73  brain]
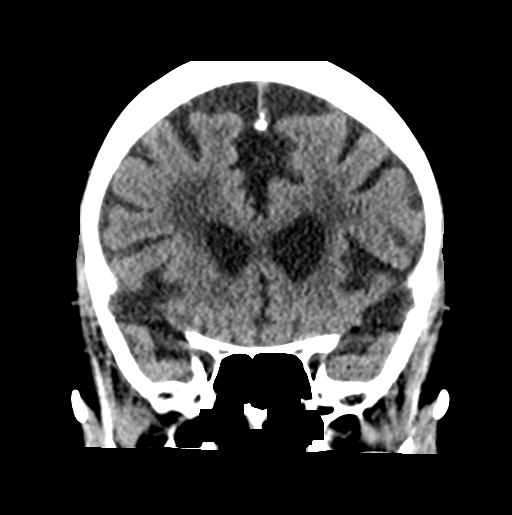
[im 33/73  brain]
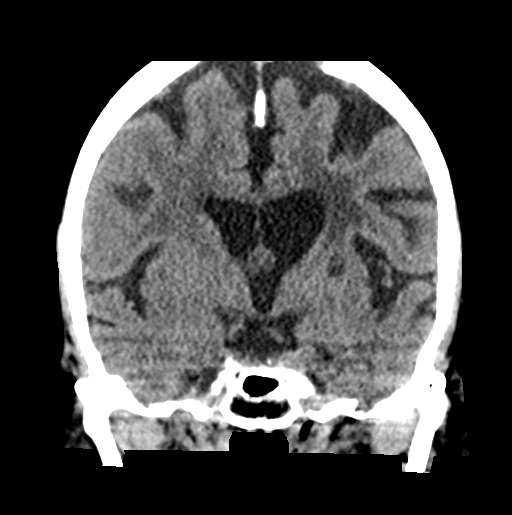
[im 41/73  brain]
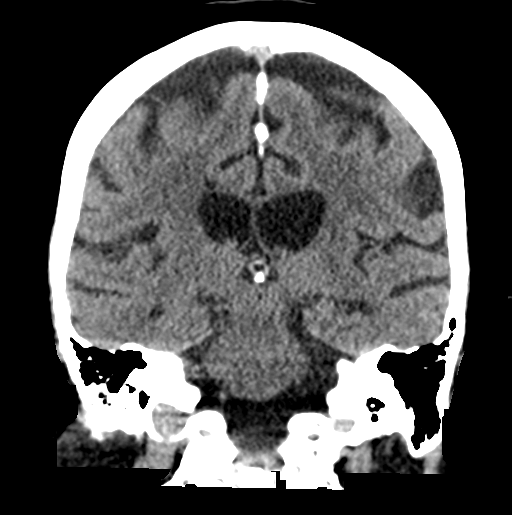

[Series 5: sagittal soft tissue · sagittal · 0.34mm/px · 3 of 57 slices shown]
[im 19/57  brain]
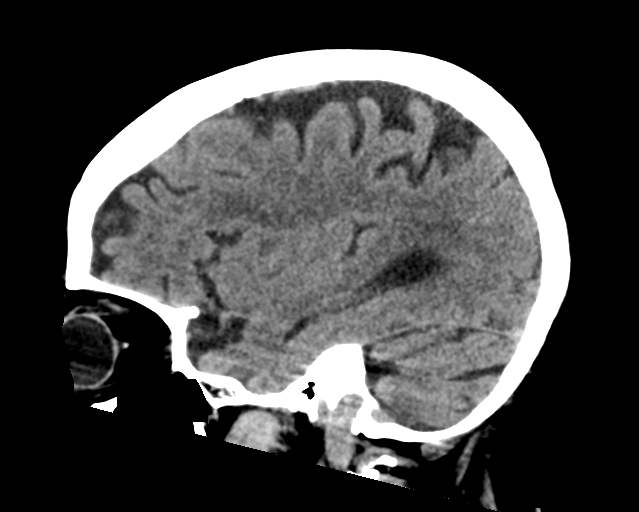
[im 29/57  brain]
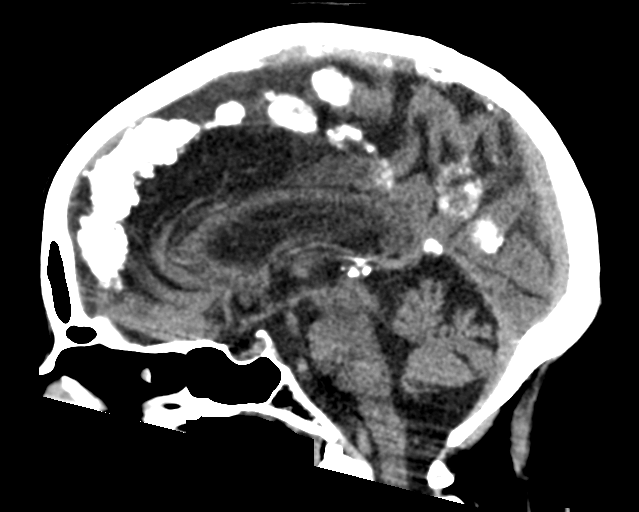
[im 38/57  brain]
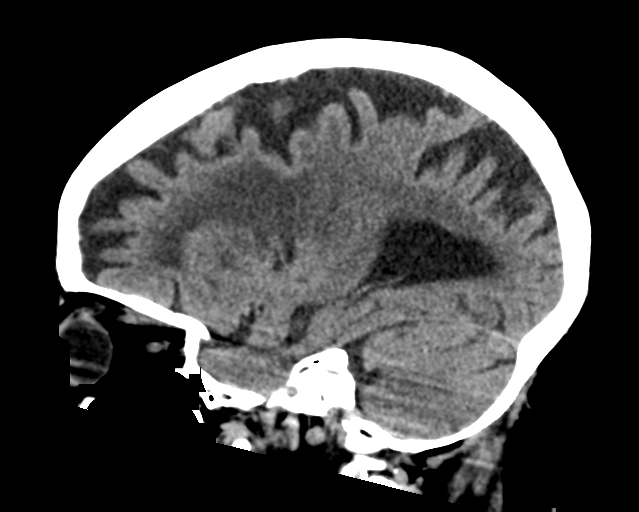

[17 of 47 positions shown; findings below may reference images not displayed]

FINDINGS: Brain: No evidence of acute territorial infarction, hemorrhage,
hydrocephalus,extra-axial collection or mass lesion/mass effect.
There is dilatation the ventricles and sulci consistent with
age-related atrophy. Low-attenuation changes in the deep white
matter consistent with small vessel ischemia. Bilateral basal
ganglia lacunar infarcts are seen.

Vascular: No hyperdense vessel or unexpected calcification.

Skull: The skull is intact. No fracture or focal lesion identified.

Sinuses/Orbits: Ethmoid air cell mucosal thickening is seen. The
orbits and globes intact.

Other: None
IMPRESSION: No acute intracranial abnormality.

Findings consistent with age related atrophy and chronic small
vessel ischemia

Stable bilateral basal ganglia lacunar infarcts.

## 2021-08-23 ENCOUNTER — Other Ambulatory Visit (INDEPENDENT_AMBULATORY_CARE_PROVIDER_SITE_OTHER): Payer: Self-pay | Admitting: Nurse Practitioner

## 2021-08-23 DIAGNOSIS — I6523 Occlusion and stenosis of bilateral carotid arteries: Secondary | ICD-10-CM

## 2021-08-26 ENCOUNTER — Ambulatory Visit (INDEPENDENT_AMBULATORY_CARE_PROVIDER_SITE_OTHER): Payer: Medicare HMO | Admitting: Vascular Surgery

## 2021-08-26 ENCOUNTER — Encounter (INDEPENDENT_AMBULATORY_CARE_PROVIDER_SITE_OTHER): Payer: Self-pay | Admitting: Vascular Surgery

## 2021-08-26 ENCOUNTER — Ambulatory Visit (INDEPENDENT_AMBULATORY_CARE_PROVIDER_SITE_OTHER): Payer: Medicare HMO

## 2021-08-26 VITALS — BP 172/84 | HR 73 | Resp 16 | Wt 149.4 lb

## 2021-08-26 DIAGNOSIS — I214 Non-ST elevation (NSTEMI) myocardial infarction: Secondary | ICD-10-CM | POA: Diagnosis not present

## 2021-08-26 DIAGNOSIS — I6523 Occlusion and stenosis of bilateral carotid arteries: Secondary | ICD-10-CM | POA: Diagnosis not present

## 2021-08-26 DIAGNOSIS — I10 Essential (primary) hypertension: Secondary | ICD-10-CM | POA: Diagnosis not present

## 2021-08-26 DIAGNOSIS — E785 Hyperlipidemia, unspecified: Secondary | ICD-10-CM

## 2021-08-26 DIAGNOSIS — E1169 Type 2 diabetes mellitus with other specified complication: Secondary | ICD-10-CM

## 2021-08-26 NOTE — Progress Notes (Signed)
MRN : [id number]  Shannon Chen is a 76 y.o. ([birth year]) female who presents with chief complaint of check carotid arteries.  History of Present Illness:  The patient is seen for follow up evaluation of carotid stenosis. The carotid stenosis followed by ultrasound.    The patient denies amaurosis fugax. There is no recent history of TIA symptoms or focal motor deficits. There is no prior documented CVA.   The patient is taking enteric-coated aspirin 81 mg daily.   There is no history of migraine headaches. There is no history of seizures.   The patient has a history of coronary artery disease, no recent episodes of angina or shortness of breath. The patient denies PAD or claudication symptoms. There is a history of hyperlipidemia which is being treated with a statin.     Duplex ultrasound shows 40 to 59% stenosis of the right ICA with 1 to 39% stenosis of the left.  There is a noted left subclavian stenosis as well.  No significant change compared to study 08/27/2020  Current Meds  Medication Sig   acetaminophen (TYLENOL) 325 MG tablet Take 650 mg by mouth every 4 (four) hours as needed for mild pain or moderate pain.   albuterol (VENTOLIN HFA) 108 (90 Base) MCG/ACT inhaler Inhale 1-2 puffs into the lungs every 6 (six) hours as needed for wheezing or shortness of breath.   amLODipine (NORVASC) 2.5 MG tablet Take 5 mg by mouth daily.   aspirin EC 81 MG EC tablet Take 1 tablet (81 mg total) by mouth daily. Swallow whole.   butalbital-acetaminophen-caffeine (FIORICET) 50-325-40 MG tablet Take 1 tablet by mouth every 4 (four) hours as needed for headache.   clopidogrel (PLAVIX) 75 MG tablet Take 75 mg by mouth daily.   dapagliflozin propanediol (FARXIGA) 10 MG TABS tablet Take 10 mg by mouth daily.   docusate sodium (COLACE) 100 MG capsule Take 1 capsule (100 mg total) by mouth 2 (two) times daily.   Finerenone (KERENDIA) 20 MG TABS Take 20 mg by mouth daily.   loperamide  (IMODIUM) 2 MG capsule Take 2 mg by mouth as needed.   mirtazapine (REMERON) 15 MG tablet Take by mouth.   polyethylene glycol (MIRALAX) 17 g packet Take 17 g by mouth daily as needed for mild constipation.   potassium chloride (KLOR-CON) 10 MEQ tablet Take 10 mEq by mouth 2 (two) times daily.   prochlorperazine (COMPAZINE) 10 MG tablet Take 1 tablet (10 mg total) by mouth every 6 (six) hours as needed for nausea or vomiting (or headache).   rosuvastatin (CRESTOR) 40 MG tablet TAKE 1 TABLET BY MOUTH DAILY   TRELEGY ELLIPTA 200-62.5-25 MCG/ACT AEPB Inhale 1 puff into the lungs daily.   Vibegron (GEMTESA) 75 MG TABS Take 75 mg by mouth daily.    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 09/2016 s/p MDT [id number] Azure XT DR MRI DC PPM   CAD (coronary artery disease)    a. 11/2020 NSTEMI/Cath: LM mild diff dzs, LAD, diff dzs, LCX 30d, RCA Ca2+ w/ diff dzs throughout, 75m. FFR cath not able to advance-->Med Rx.   Carotid artery disease ([name])    a. 09/2016 Carotid U/S: RICA 70-99%, LICA 50-69%;  b. 09/2016 CTA Neck: RICA 60, RCCA 30, LICA 70%, L Vert 60, L Basilar 70; c. 10/2019 s/p R CEA.   Essential hypertension    History of stress test  a. 12/2016 MV: EF 51%, no ischemia/infarct; b. 10/2019 MV: EF 61%, no ischemia/infarct.   Hyperlipidemia    Hypertrophic cardiomyopathy ([name])    a. 09/2016 Echo: EF 60-65%, no rwma, Gr1 DD, Ca2+ MV annulus; b. 11/2017 Echo: EF 70-75%, no rwma, Gr1 DD, mild AS vs dynamic LVOT obs. Nl RV fxn. Hyperdynamic LVEF w/ sev LVH; c. 10/2019 Echo: EF 70-75%, no rwma, gr1 DD, sev LVH, Gr1 DD, nl PASP, triv MR, mild-mod Ao sclerosis; c. 05/2020 Echo: EF 60-65%; e. 11/2020 Echo: EF 60-65%, sev LVH, nl RV fxn, Mild MR. Ao Sclerosis.   Presence of permanent cardiac pacemaker     Past Surgical History:  Procedure Laterality Date   ABDOMINAL HYSTERECTOMY     CORONARY STENT INTERVENTION N/A 12/31/2020   Procedure: CORONARY STENT INTERVENTION;   Surgeon: Iran Ouch, MD;  Location: [facility] INVASIVE CV LAB;  Service: Cardiovascular;  Laterality: N/A;   ENDARTERECTOMY Right 11/16/2019   Procedure: ENDARTERECTOMY CAROTID;  Surgeon: Renford Dills, MD;  Location: [facility] ORS;  Service: Vascular;  Laterality: Right;   INTRAVASCULAR PRESSURE WIRE/FFR STUDY N/A 12/13/2020   Procedure: INTRAVASCULAR PRESSURE WIRE/FFR STUDY;  Surgeon: Marykay Lex, MD;  Location: [facility] INVASIVE CV LAB;  Service: Cardiovascular;  Laterality: N/A;   LEFT HEART CATH AND CORONARY ANGIOGRAPHY N/A 12/13/2020   Procedure: LEFT HEART CATH AND CORONARY ANGIOGRAPHY;  Surgeon: Marykay Lex, MD;  Location: [facility] INVASIVE CV LAB;  Service: Cardiovascular;  Laterality: N/A;   LEFT HEART CATH AND CORONARY ANGIOGRAPHY N/A 12/31/2020   Procedure: LEFT HEART CATH AND CORONARY ANGIOGRAPHY;  Surgeon: Iran Ouch, MD;  Location: [facility] INVASIVE CV LAB;  Service: Cardiovascular;  Laterality: N/A;   PACEMAKER IMPLANT N/A 10/08/2016   Procedure: Pacemaker Implant;  Surgeon: Marinus Maw, MD;  Location: [facility] INVASIVE CV LAB;  Service: Cardiovascular;  Laterality: N/A;    Social History Social History   Tobacco Use   Smoking status: Former    Packs/day: 1.00    Years: 30.00    Total pack years: 30.00    Types: Cigarettes    Quit date: 11/09/2018    Years since quitting: 2.7   Smokeless tobacco: Never  Vaping Use   Vaping Use: Never used  Substance Use Topics   Alcohol use: No   Drug use: No    Family History Family History  Problem Relation Age of Onset   Diabetes Mother    CAD Father     No Known Allergies   REVIEW OF SYSTEMS (Negative unless checked)  Constitutional: [] Weight loss  [] Fever  [] Chills Cardiac: [] Chest pain   [] Chest pressure   [] Palpitations   [] Shortness of breath when laying flat   [] Shortness of breath with exertion. Vascular:  [x] Pain in legs with walking   [] Pain in legs at rest  [] History of DVT   [] Phlebitis   [] Swelling in  legs   [] Varicose veins   [] Non-healing ulcers Pulmonary:   [] Uses home oxygen   [] Productive cough   [] Hemoptysis   [] Wheeze  [] COPD   [] Asthma Neurologic:  [] Dizziness   [] Seizures   [] History of stroke   [] History of TIA  [] Aphasia   [] Vissual changes   [] Weakness or numbness in arm   [] Weakness or numbness in leg Musculoskeletal:   [] Joint swelling   [] Joint pain   [] Low back pain Hematologic:  [] Easy bruising  [] Easy bleeding   [] Hypercoagulable state   [] Anemic Gastrointestinal:  [] Diarrhea   [] Vomiting  [] Gastroesophageal reflux/heartburn   [] Difficulty swallowing.  Genitourinary:  [] Chronic kidney disease   [] Difficult urination  [] Frequent urination   [] Blood in urine Skin:  [] Rashes   [] Ulcers  Psychological:  [] History of anxiety   []  History of major depression.  Physical Examination  Vitals:   08/26/21 1120  BP: (!) 172/84  Pulse: 73  Resp: 16  Weight: 149 lb 6.4 oz (67.8 kg)   Body mass index is 24.48 kg/m. Gen: WD/WN, NAD Head: Crum/AT, No temporalis wasting.  Ear/Nose/Throat: Hearing grossly intact, nares w/o erythema or drainage Eyes: PER, EOMI, sclera nonicteric.  Neck: Supple, no masses.  No bruit or JVD.  Pulmonary:  Good air movement, no audible wheezing, no use of accessory muscles.  Cardiac: RRR, normal S1, S2, no Murmurs. Vascular:  carotid bruit noted Vessel Right Left  Radial Palpable Palpable  Carotid  Palpable  Palpable  Subclav  Palpable Palpable  Gastrointestinal: soft, non-distended. No guarding/no peritoneal signs.  Musculoskeletal: M/S 5/5 throughout.  No visible deformity.  Neurologic: CN 2-12 intact. Pain and light touch intact in extremities.  Symmetrical.  Speech is fluent. Motor exam as listed above. Psychiatric: Judgment intact, Mood & affect appropriate for pt's clinical situation. Dermatologic: No rashes or ulcers noted.  No changes consistent with cellulitis.   CBC Lab Results  Component Value Date   WBC 4.6 04/08/2021   HGB 12.5  04/08/2021   HCT 41.1 04/08/2021   MCV 84.9 04/08/2021   PLT 175 04/08/2021    BMET    Component Value Date/Time   NA 139 04/08/2021 0954   K 4.5 04/08/2021 0954   CL 109 04/08/2021 0954   CO2 23 04/08/2021 0954   GLUCOSE 126 (H) 04/08/2021 0954   BUN 29 (H) 04/08/2021 0954   CREATININE 1.28 (H) 04/08/2021 0954   CALCIUM 9.1 04/08/2021 0954   GFRNONAA 43 (L) 04/08/2021 0954   GFRAA >60 11/18/2019 0619   CrCl cannot be calculated (Patient's most recent lab result is older than the maximum 21 days allowed.).  COAG Lab Results  Component Value Date   INR 1.1 12/29/2020   INR 1.0 12/28/2020   INR 1.1 12/12/2020    Radiology No results found.   Assessment/Plan 1. Bilateral carotid artery stenosis Recommend:  Given the patient's asymptomatic subcritical stenosis no further invasive testing or surgery at this time.  Duplex ultrasound shows 40 to 59% stenosis of the right ICA with 1 to 39% stenosis of the left.  There is a noted left subclavian stenosis as well.  No significant change compared to study 08/27/2020  Continue antiplatelet therapy as prescribed Continue management of CAD, HTN and Hyperlipidemia Healthy heart diet,  encouraged exercise at least 4 times per week Follow up in 3 years with duplex ultrasound and physical exam   - VAS 04/10/2021 CAROTID  2. Carotid stenosis, bilateral See #1  3. Primary hypertension Continue antihypertensive medications as already ordered, these medications have been reviewed and there are no changes at this time.   4. Non-ST elevation (NSTEMI) myocardial infarction [facility]) Continue cardiac and antihypertensive medications as already ordered and reviewed, no changes at this time.  Continue statin as ordered and reviewed, no changes at this time  Nitrates PRN for chest pain   5. Type 2 diabetes mellitus with hyperlipidemia ([name]) Continue hypoglycemic medications as already ordered, these medications have been reviewed and there are no  changes at this time.  Hgb A1C to be monitored as already arranged by primary service   6. Dyslipidemia Continue statin as ordered and reviewed, no  changes at this time      Levora Dredge, MD  08/26/2021 11:21 AM

## 2021-08-30 ENCOUNTER — Encounter (INDEPENDENT_AMBULATORY_CARE_PROVIDER_SITE_OTHER): Payer: Self-pay | Admitting: Vascular Surgery

## 2021-11-11 ENCOUNTER — Ambulatory Visit: Payer: Medicare HMO | Admitting: Podiatry

## 2021-11-12 ENCOUNTER — Ambulatory Visit: Payer: Medicare HMO | Attending: Internal Medicine | Admitting: Internal Medicine

## 2021-11-12 ENCOUNTER — Telehealth: Payer: Self-pay | Admitting: Internal Medicine

## 2021-11-12 ENCOUNTER — Encounter: Payer: Self-pay | Admitting: Internal Medicine

## 2021-11-12 VITALS — BP 120/60 | HR 87 | Ht 65.0 in | Wt 144.1 lb

## 2021-11-12 DIAGNOSIS — I422 Other hypertrophic cardiomyopathy: Secondary | ICD-10-CM

## 2021-11-12 DIAGNOSIS — I10 Essential (primary) hypertension: Secondary | ICD-10-CM

## 2021-11-12 DIAGNOSIS — Z950 Presence of cardiac pacemaker: Secondary | ICD-10-CM | POA: Diagnosis not present

## 2021-11-12 DIAGNOSIS — I441 Atrioventricular block, second degree: Secondary | ICD-10-CM

## 2021-11-12 LAB — CUP PACEART INCLINIC DEVICE CHECK
Battery Remaining Longevity: 114 mo
Battery Voltage: 3 V
Brady Statistic AP VP Percent: 17.3 %
Brady Statistic AP VS Percent: 2.39 %
Brady Statistic AS VP Percent: 24.78 %
Brady Statistic AS VS Percent: 55.53 %
Brady Statistic RA Percent Paced: 19.7 %
Brady Statistic RV Percent Paced: 42.08 %
Date Time Interrogation Session: 20230926120000
Implantable Lead Implant Date: 20180822
Implantable Lead Implant Date: 20180822
Implantable Lead Location: 753859
Implantable Lead Location: 753860
Implantable Lead Model: 3830
Implantable Lead Model: 5076
Implantable Pulse Generator Implant Date: 20180822
Lead Channel Impedance Value: 361 Ohm
Lead Channel Impedance Value: 494 Ohm
Lead Channel Impedance Value: 646 Ohm
Lead Channel Impedance Value: 684 Ohm
Lead Channel Pacing Threshold Amplitude: 0.5 V
Lead Channel Pacing Threshold Amplitude: 1.125 V
Lead Channel Pacing Threshold Pulse Width: 0.4 ms
Lead Channel Pacing Threshold Pulse Width: 0.4 ms
Lead Channel Sensing Intrinsic Amplitude: 3.75 mV
Lead Channel Sensing Intrinsic Amplitude: 5.25 mV
Lead Channel Sensing Intrinsic Amplitude: 5.75 mV
Lead Channel Sensing Intrinsic Amplitude: 6.75 mV
Lead Channel Setting Pacing Amplitude: 1.5 V
Lead Channel Setting Pacing Amplitude: 2 V
Lead Channel Setting Pacing Pulse Width: 1 ms
Lead Channel Setting Sensing Sensitivity: 2 mV

## 2021-11-12 NOTE — Patient Instructions (Signed)
Medication Instructions:  - Your physician recommends that you continue on your current medications as directed. Please refer to the Current Medication list given to you today.  *If you need a refill on your cardiac medications before your next appointment, please call your pharmacy*   Lab Work: - Your physician recommends that you have lab work today:   [place] Entrance at [facility] 1st desk on the right to check in (REGISTRATION)  Lab hours: Monday- Friday (7:30 am- 5:30 pm)   If you have labs (blood work) drawn today and your tests are completely normal, you will receive your results only by: MyChart Message (if you have MyChart) OR A paper copy in the mail If you have any lab test that is abnormal or we need to change your treatment, we will call you to review the results.   Testing/Procedures: - none ordered   Follow-Up: At [facility], you and your health needs are our priority.  As part of our continuing mission to provide you with exceptional heart care, we have created designated Provider Care Teams.  These Care Teams include your primary Cardiologist (physician) and Advanced Practice Providers (APPs -  Physician Assistants and Nurse Practitioners) who all work together to provide you with the care you need, when you need it.  We recommend signing up for the patient portal called "MyChart".  Sign up information is provided on this After Visit Summary.  MyChart is used to connect with patients for Virtual Visits (Telemedicine).  Patients are able to view lab/test results, encounter notes, upcoming appointments, etc.  Non-urgent messages can be sent to your provider as well.   To learn more about what you can do with MyChart, go to [email or web address].    Your next appointment:   1 year(s)  The format for your next appointment:   In Person  Provider:   Virl Axe, MD    Other Instructions N/a  Important Information About Sugar

## 2021-11-12 NOTE — Progress Notes (Signed)
Patient Care Team: Remi Haggard, FNP as PCP - General (Family Medicine) Minna Merritts, MD as PCP - Cardiology (Cardiology) Deboraha Sprang, MD as PCP - Electrophysiology (Cardiology) Gae Bon, NP (Nurse Practitioner)   HPI  Shannon Chen is a 76 y.o. female Seen in followup for Medtronic Para Hisian PM implanted 8/18 for symptomatic second-degree heart block assoc w syncope.  Has significant hypertension and has variably reported left ventricular hypertrophy over the last 4 years (1.2-1.8 cm septum- 1.1/1.8 cm posterior wall)  Endarterectomy 9/21 right carotid in the setting of a prior stroke.  11/22 nSTEMI ((See Below)) Cx by AKI, acute/chronic and hypotension   Now living in facility, sticks to herself --  reads detective stories  The patient denies chest pain, shortness of breath, nocturnal dyspnea, orthopnea or peripheral edema.  There have been no palpitations, lightheadedness or syncope. Marland Kitchen   DATE TEST EF   8/18 Echo   65 %  LVH minimal 12/11 mm  11/18 Myoview     % No ischemia  3/21 Echo    LVH 1.62/1.82 cm (cavity obliteration)  9/21 Echo  70-75% Indeterminate cusps//LVH 18/15 mm  9/21 Myoview 61% No ischemia  11/22 LHC  RCAp-60; RCAm-90%>> failed PCI LADp-50%  11/22 Echo   60-65% LVH severe 16/12 mm     Records and Results Reviewed  Date Cr K Hgb  8/18  0.81 3.8 13.6  10/19  0.65 3.9   2/23 1.28 4.5 12.5      Past Medical History:  Diagnosis Date   Arthritis    AV block, Mobitz II    a. 09/2016 s/p MDT [id number] Azure XT DR MRI DC PPM   CAD (coronary artery disease)    a. 11/2020 NSTEMI/Cath: LM mild diff dzs, LAD, diff dzs, LCX 30d, RCA Ca2+ w/ diff dzs throughout, 29m. FFR cath not able to advance-->Med Rx.   Carotid artery disease ([place])    a. 09/2016 Carotid U/S: RICA [id number], LICA 0000000;  b. [id number] CTA Neck: RICA 60, RCCA 30, LICA [id number], L Vert 60, L Basilar 70; c. 10/2019 s/p R CEA.   Essential hypertension    History of stress  test    a. 12/2016 MV: EF 51%, no ischemia/infarct; b. 10/2019 MV: EF 61%, no ischemia/infarct.   Hyperlipidemia    Hypertrophic cardiomyopathy ([place])    a. 09/2016 Echo: EF 60-65%, no rwma, Gr1 DD, Ca2+ MV annulus; b. 11/2017 Echo: EF 70-75%, no rwma, Gr1 DD, mild AS vs dynamic LVOT obs. Nl RV fxn. Hyperdynamic LVEF w/ sev LVH; c. 10/2019 Echo: EF 70-75%, no rwma, gr1 DD, sev LVH, Gr1 DD, nl PASP, triv MR, mild-mod Ao sclerosis; c. 05/2020 Echo: EF 60-65%; e. 11/2020 Echo: EF 60-65%, sev LVH, nl RV fxn, Mild MR. Ao Sclerosis.   Presence of permanent cardiac pacemaker     Past Surgical History:  Procedure Laterality Date   ABDOMINAL HYSTERECTOMY     CORONARY STENT INTERVENTION N/A 12/31/2020   Procedure: CORONARY STENT INTERVENTION;  Surgeon: Wellington Hampshire, MD;  Location: [place] CV LAB;  Service: Cardiovascular;  Laterality: N/A;   ENDARTERECTOMY Right 11/16/2019   Procedure: ENDARTERECTOMY CAROTID;  Surgeon: Katha Cabal, MD;  Location: [facility] ORS;  Service: Vascular;  Laterality: Right;   INTRAVASCULAR PRESSURE WIRE/FFR STUDY N/A 12/13/2020   Procedure: INTRAVASCULAR PRESSURE WIRE/FFR STUDY;  Surgeon: Leonie Man, MD;  Location: [place] CV LAB;  Service: Cardiovascular;  Laterality: N/A;   LEFT HEART  CATH AND CORONARY ANGIOGRAPHY N/A 12/13/2020   Procedure: LEFT HEART CATH AND CORONARY ANGIOGRAPHY;  Surgeon: Leonie Man, MD;  Location: [place] CV LAB;  Service: Cardiovascular;  Laterality: N/A;   LEFT HEART CATH AND CORONARY ANGIOGRAPHY N/A 12/31/2020   Procedure: LEFT HEART CATH AND CORONARY ANGIOGRAPHY;  Surgeon: Wellington Hampshire, MD;  Location: [place] CV LAB;  Service: Cardiovascular;  Laterality: N/A;   PACEMAKER IMPLANT N/A 10/08/2016   Procedure: Pacemaker Implant;  Surgeon: Evans Lance, MD;  Location: [place] CV LAB;  Service: Cardiovascular;  Laterality: N/A;    Current Outpatient Medications  Medication Sig Dispense Refill    acetaminophen (TYLENOL) 325 MG tablet Take 650 mg by mouth every 4 (four) hours as needed for mild pain or moderate pain.     albuterol (VENTOLIN HFA) 108 (90 Base) MCG/ACT inhaler Inhale 1-2 puffs into the lungs every 6 (six) hours as needed for wheezing or shortness of breath.     amLODipine (NORVASC) 2.5 MG tablet Take 5 mg by mouth daily.     aspirin EC 81 MG EC tablet Take 1 tablet (81 mg total) by mouth daily. Swallow whole. 30 tablet 11   butalbital-acetaminophen-caffeine (FIORICET) 50-325-40 MG tablet Take 1 tablet by mouth every 4 (four) hours as needed for headache. 14 tablet 0   clopidogrel (PLAVIX) 75 MG tablet Take 75 mg by mouth daily.     dapagliflozin propanediol (FARXIGA) 10 MG TABS tablet Take 10 mg by mouth daily.     docusate sodium (COLACE) 100 MG capsule Take 1 capsule (100 mg total) by mouth 2 (two) times daily. 60 capsule 2   Finerenone (KERENDIA) 20 MG TABS Take 20 mg by mouth daily.     loperamide (IMODIUM) 2 MG capsule Take 2 mg by mouth as needed.     meclizine (ANTIVERT) 12.5 MG tablet Take 0.5 mg by mouth as needed for dizziness.     mirtazapine (REMERON) 7.5 MG tablet Take 7.5 mg by mouth at bedtime.     polyethylene glycol (MIRALAX) 17 g packet Take 17 g by mouth daily as needed for mild constipation. 14 each 2   potassium chloride (KLOR-CON) 10 MEQ tablet Take 10 mEq by mouth daily.     rosuvastatin (CRESTOR) 40 MG tablet TAKE 1 TABLET BY MOUTH DAILY 90 tablet 0   TRELEGY ELLIPTA 200-62.5-25 MCG/ACT AEPB Inhale 1 puff into the lungs daily.     Vibegron (GEMTESA) 75 MG TABS Take 75 mg by mouth daily.     metoprolol succinate (TOPROL-XL) 50 MG 24 hr tablet Take 1 tablet (50 mg total) by mouth daily. 30 tablet 2   mirtazapine (REMERON) 15 MG tablet Take by mouth. (Patient not taking: Reported on 11/12/2021)     prochlorperazine (COMPAZINE) 10 MG tablet Take 1 tablet (10 mg total) by mouth every 6 (six) hours as needed for nausea or vomiting (or headache). (Patient not  taking: Reported on 11/12/2021) 12 tablet 0   No current facility-administered medications for this visit.  What we have her increase  No Known Allergies    Review of Systems negative except from HPI and PMH  Physical Exam BP 120/60 (BP Location: Right Arm, Patient Position: Sitting, Cuff Size: Normal)   Pulse 87   Ht 5\' 5"  (1.651 m)   Wt 144 lb 2 oz (65.4 kg)   SpO2 98%   BMI 23.98 kg/m  Well developed and well nourished in no acute distress HENT normal Neck supple with JVP-flat  Clear Device pocket well healed; without hematoma or erythema.  There is no tethering  Regular rate and rhythm,2/6 murmur Abd-soft with active BS No Clubbing cyanosis   edema Skin-warm and dry A & Oriented  Grossly normal sensory and motor function  ECG sinus at 87 Intervals 15/12/40 Right bundle branch block  Multiple electrocardiograms were reviewed back to 2018.  T wave inversions are more prominent now than ever but have been quite prominent before but also not evident 4 (5/18)   Assessment and  Plan  Mobitz 2 AVB  Pacemaker--Medtronic ParaHisian   Abnormal ECG-dynamic T wave changes  Syncope  Hypertrophic heart disease ?HCM   Coronary artery disease-medical therapy  Hypertension  Renal insufficiency grade 3A  Stress  VT nonsustained RA thank you  Aortic stenosis-mild  Right bundle branch block-chronic  Preoperative assessment    Living in a care facility.  Life is much better. Blood pressures have been much better.  Continue the amlodipine 2.5 and metoprolol 50.  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  No chest pain.  On rosuvastatin and aspirin.  Last creatinine had gone up about 25%.  We will recheck.  She has no cardiac contraindications to having her tooth extracted.  Right bundle branch block is stable     Normal device function.  30% ventricular pacing.  No interval syncope.     Current medicines are reviewed at length with the patient  today .  The patient does not  have concerns regarding medicines.

## 2021-11-12 NOTE — Telephone Encounter (Signed)
New Message:     Patient saw Dr Caryl Comes today(11-11-21). The dentis office needs an okay for patient to have dental wok. It needs to state that she is medically okay to have it. Please fax to [phone].

## 2021-11-13 ENCOUNTER — Telehealth: Payer: Self-pay | Admitting: Internal Medicine

## 2021-11-13 ENCOUNTER — Other Ambulatory Visit
Admission: RE | Admit: 2021-11-13 | Discharge: 2021-11-13 | Disposition: A | Discharge status: Home / Self Care | Payer: Medicare HMO | Attending: Internal Medicine | Admitting: Internal Medicine

## 2021-11-13 DIAGNOSIS — I10 Essential (primary) hypertension: Secondary | ICD-10-CM | POA: Diagnosis present

## 2021-11-13 DIAGNOSIS — N289 Disorder of kidney and ureter, unspecified: Secondary | ICD-10-CM

## 2021-11-13 DIAGNOSIS — I422 Other hypertrophic cardiomyopathy: Secondary | ICD-10-CM | POA: Insufficient documentation

## 2021-11-13 LAB — BASIC METABOLIC PANEL
Anion gap: 9 (ref 5–15)
BUN: 40 mg/dL — ABNORMAL HIGH (ref 8–23)
CO2: 22 mmol/L (ref 22–32)
Calcium: 9.5 mg/dL (ref 8.9–10.3)
Chloride: 111 mmol/L (ref 98–111)
Creatinine, Ser: 1.66 mg/dL — ABNORMAL HIGH (ref 0.44–1.00)
GFR, Estimated: 31 mL/min — ABNORMAL LOW (ref >60)
Glucose, Bld: 119 mg/dL — ABNORMAL HIGH (ref 70–99)
Potassium: 4.3 mmol/L (ref 3.5–5.1)
Sodium: 142 mmol/L (ref 135–145)

## 2021-11-13 NOTE — Telephone Encounter (Signed)
Shannon Filbert, RN  11/13/2021  2:08 PM EDT     Deboraha Sprang, MD  Shannon Filbert, RN Cc: Minna Merritts, MD Tim good morning.  Could you look at this lady's labs, we take care of her together, she is on finerenone and her BUN and creatinine are extremely high her creatinine is almost doubled.  I am going to ask Nira Conn to stop it for now and we can reassess her metabolic profile    Shannon Filbert, RN  11/13/2021  2:08 PM EDT Back to Top    Confirmed with Dr. Caryl Comes to repeat a BMP in 2 weeks.

## 2021-11-13 NOTE — Telephone Encounter (Signed)
Attempted to call [facility] ([facility]) where the patient resides. I spoke with [place]- phone rep, and she advised that the person I need to speak with is [place]. Marcelline Mates is currently out, but I have left a message to please have Marcelline Mates call me back to discuss lab work and medication changes. ' Sonja read back my message and contact information for verification.

## 2021-11-13 NOTE — Telephone Encounter (Signed)
A copy of Dr. Olin Pia office note from 11/12/21 was faxed to [phone]. Confirmation received.   Per Dr. Olin Pia note from 11/12/21: She has no cardiac contraindications to having her tooth extracted.

## 2021-11-18 ENCOUNTER — Encounter: Payer: Self-pay | Admitting: *Deleted

## 2021-11-18 NOTE — Telephone Encounter (Signed)
Refaxed orders to [phone]. Fax confirmation received.

## 2021-11-18 NOTE — Telephone Encounter (Signed)
Minna Merritts, MD  Emily Filbert, RN She is somewhat limited in her mobility  Perhaps we can request the staff to liberalize her fluid intake somewhat  Agree with holding the finerenone  Thx  TG

## 2021-11-18 NOTE — Telephone Encounter (Signed)
I called back to [facility] and spoke with [facility].  I have advised Mize of the patient's lab results and MD recommendations to: 1) STOP Finerenone 2) Repeat a BMP in 2 weeks 3) Increase fluid (water) intake slightly   Cherry voices understanding of these recommendations and is agreeable. She also gave me the fax # to send a written copy of these instructions to as well at [phone].  Orders faxed to [phone]. 1st fax attempt failed.   Will attempt again.

## 2021-11-25 ENCOUNTER — Ambulatory Visit (INDEPENDENT_AMBULATORY_CARE_PROVIDER_SITE_OTHER): Payer: Medicare HMO | Admitting: Podiatry

## 2021-11-25 ENCOUNTER — Encounter: Payer: Self-pay | Admitting: Podiatry

## 2021-11-25 DIAGNOSIS — E1142 Type 2 diabetes mellitus with diabetic polyneuropathy: Secondary | ICD-10-CM

## 2021-11-25 DIAGNOSIS — M79674 Pain in right toe(s): Secondary | ICD-10-CM

## 2021-11-25 DIAGNOSIS — M79675 Pain in left toe(s): Secondary | ICD-10-CM

## 2021-11-25 DIAGNOSIS — B351 Tinea unguium: Secondary | ICD-10-CM | POA: Diagnosis not present

## 2021-11-25 DIAGNOSIS — N179 Acute kidney failure, unspecified: Secondary | ICD-10-CM

## 2021-11-25 NOTE — Progress Notes (Signed)
This patient returns to my office for at risk foot care.  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  This patient is unable to cut nails herself since the patient cannot reach her nails.These nails are painful walking and wearing shoes.  This patient presents for at risk foot care today.  General Appearance  Alert, conversant and in no acute stress.  Vascular  Dorsalis pedis and posterior tibial  pulses are absent  bilaterally.  Capillary return is within normal limits  bilaterally. Cold feet.    Bilaterally. Absent digital hair  B/L.  Neurologic  Senn-Weinstein monofilament wire test absent  bilaterally. .  Nails Thick disfigured discolored nails with subungual debris  from hallux to fifth toes bilaterally. No evidence of bacterial infection or drainage bilaterally.  Orthopedic  No limitations of motion  feet .  No crepitus or effusions noted.  No bony pathology or digital deformities noted.  Skin  normotropic skin with no porokeratosis noted bilaterally.  No signs of infections or ulcers noted.     Onychomycosis  Pain in right toes  Pain in left toes  Consent was obtained for treatment procedures.   Mechanical debridement of nails 1-5  bilaterally performed with a nail nipper.  Filed with dremel without incident.    Return office visit   4   months                   Told patient to return for periodic foot care and evaluation due to potential at risk complications.   Gardiner Barefoot DPM

## 2021-12-09 ENCOUNTER — Emergency Department: Payer: Medicare HMO

## 2021-12-09 ENCOUNTER — Observation Stay: Payer: Medicare HMO

## 2021-12-09 ENCOUNTER — Inpatient Hospital Stay
Admission: EM | Admit: 2021-12-09 | Discharge: 2021-12-23 | DRG: 500 | Disposition: A | Discharge status: Skilled Nursing Facility | Payer: Medicare HMO | Attending: Internal Medicine | Admitting: Internal Medicine

## 2021-12-09 ENCOUNTER — Other Ambulatory Visit: Payer: Self-pay

## 2021-12-09 ENCOUNTER — Encounter: Payer: Self-pay | Admitting: Emergency Medicine

## 2021-12-09 DIAGNOSIS — I442 Atrioventricular block, complete: Secondary | ICD-10-CM | POA: Diagnosis present

## 2021-12-09 DIAGNOSIS — Z8249 Family history of ischemic heart disease and other diseases of the circulatory system: Secondary | ICD-10-CM

## 2021-12-09 DIAGNOSIS — G7249 Other inflammatory and immune myopathies, not elsewhere classified: Secondary | ICD-10-CM | POA: Diagnosis not present

## 2021-12-09 DIAGNOSIS — Z90710 Acquired absence of both cervix and uterus: Secondary | ICD-10-CM

## 2021-12-09 DIAGNOSIS — M1990 Unspecified osteoarthritis, unspecified site: Secondary | ICD-10-CM | POA: Diagnosis present

## 2021-12-09 DIAGNOSIS — I772 Rupture of artery: Secondary | ICD-10-CM | POA: Diagnosis present

## 2021-12-09 DIAGNOSIS — Y838 Other surgical procedures as the cause of abnormal reaction of the patient, or of later complication, without mention of misadventure at the time of the procedure: Secondary | ICD-10-CM | POA: Diagnosis present

## 2021-12-09 DIAGNOSIS — I708 Atherosclerosis of other arteries: Secondary | ICD-10-CM | POA: Diagnosis present

## 2021-12-09 DIAGNOSIS — E1169 Type 2 diabetes mellitus with other specified complication: Secondary | ICD-10-CM | POA: Diagnosis present

## 2021-12-09 DIAGNOSIS — Z7982 Long term (current) use of aspirin: Secondary | ICD-10-CM

## 2021-12-09 DIAGNOSIS — I10 Essential (primary) hypertension: Secondary | ICD-10-CM | POA: Diagnosis present

## 2021-12-09 DIAGNOSIS — I130 Hypertensive heart and chronic kidney disease with heart failure and stage 1 through stage 4 chronic kidney disease, or unspecified chronic kidney disease: Secondary | ICD-10-CM | POA: Diagnosis present

## 2021-12-09 DIAGNOSIS — Z833 Family history of diabetes mellitus: Secondary | ICD-10-CM

## 2021-12-09 DIAGNOSIS — Z7951 Long term (current) use of inhaled steroids: Secondary | ICD-10-CM

## 2021-12-09 DIAGNOSIS — E872 Acidosis, unspecified: Secondary | ICD-10-CM | POA: Diagnosis not present

## 2021-12-09 DIAGNOSIS — Z7902 Long term (current) use of antithrombotics/antiplatelets: Secondary | ICD-10-CM

## 2021-12-09 DIAGNOSIS — I2510 Atherosclerotic heart disease of native coronary artery without angina pectoris: Secondary | ICD-10-CM | POA: Diagnosis present

## 2021-12-09 DIAGNOSIS — R809 Proteinuria, unspecified: Secondary | ICD-10-CM | POA: Diagnosis present

## 2021-12-09 DIAGNOSIS — I25118 Atherosclerotic heart disease of native coronary artery with other forms of angina pectoris: Secondary | ICD-10-CM

## 2021-12-09 DIAGNOSIS — D72829 Elevated white blood cell count, unspecified: Secondary | ICD-10-CM | POA: Diagnosis not present

## 2021-12-09 DIAGNOSIS — I700 Atherosclerosis of aorta: Secondary | ICD-10-CM | POA: Diagnosis present

## 2021-12-09 DIAGNOSIS — D638 Anemia in other chronic diseases classified elsewhere: Secondary | ICD-10-CM | POA: Diagnosis present

## 2021-12-09 DIAGNOSIS — E559 Vitamin D deficiency, unspecified: Secondary | ICD-10-CM | POA: Diagnosis present

## 2021-12-09 DIAGNOSIS — Z87891 Personal history of nicotine dependence: Secondary | ICD-10-CM

## 2021-12-09 DIAGNOSIS — Z955 Presence of coronary angioplasty implant and graft: Secondary | ICD-10-CM

## 2021-12-09 DIAGNOSIS — R531 Weakness: Secondary | ICD-10-CM | POA: Diagnosis not present

## 2021-12-09 DIAGNOSIS — Z818 Family history of other mental and behavioral disorders: Secondary | ICD-10-CM

## 2021-12-09 DIAGNOSIS — E785 Hyperlipidemia, unspecified: Secondary | ICD-10-CM | POA: Diagnosis present

## 2021-12-09 DIAGNOSIS — M6282 Rhabdomyolysis: Secondary | ICD-10-CM | POA: Diagnosis present

## 2021-12-09 DIAGNOSIS — R292 Abnormal reflex: Secondary | ICD-10-CM | POA: Diagnosis present

## 2021-12-09 DIAGNOSIS — E1140 Type 2 diabetes mellitus with diabetic neuropathy, unspecified: Secondary | ICD-10-CM | POA: Diagnosis present

## 2021-12-09 DIAGNOSIS — N2889 Other specified disorders of kidney and ureter: Secondary | ICD-10-CM | POA: Diagnosis present

## 2021-12-09 DIAGNOSIS — Z1152 Encounter for screening for COVID-19: Secondary | ICD-10-CM

## 2021-12-09 DIAGNOSIS — Z8673 Personal history of transient ischemic attack (TIA), and cerebral infarction without residual deficits: Secondary | ICD-10-CM

## 2021-12-09 DIAGNOSIS — I4891 Unspecified atrial fibrillation: Secondary | ICD-10-CM | POA: Diagnosis present

## 2021-12-09 DIAGNOSIS — Z79899 Other long term (current) drug therapy: Secondary | ICD-10-CM

## 2021-12-09 DIAGNOSIS — I252 Old myocardial infarction: Secondary | ICD-10-CM

## 2021-12-09 DIAGNOSIS — Z950 Presence of cardiac pacemaker: Secondary | ICD-10-CM

## 2021-12-09 DIAGNOSIS — I723 Aneurysm of iliac artery: Secondary | ICD-10-CM | POA: Diagnosis present

## 2021-12-09 DIAGNOSIS — D62 Acute posthemorrhagic anemia: Secondary | ICD-10-CM | POA: Diagnosis not present

## 2021-12-09 DIAGNOSIS — N179 Acute kidney failure, unspecified: Secondary | ICD-10-CM | POA: Diagnosis present

## 2021-12-09 DIAGNOSIS — N189 Chronic kidney disease, unspecified: Secondary | ICD-10-CM | POA: Diagnosis present

## 2021-12-09 DIAGNOSIS — I422 Other hypertrophic cardiomyopathy: Secondary | ICD-10-CM

## 2021-12-09 DIAGNOSIS — E860 Dehydration: Secondary | ICD-10-CM | POA: Diagnosis present

## 2021-12-09 DIAGNOSIS — R262 Difficulty in walking, not elsewhere classified: Secondary | ICD-10-CM | POA: Diagnosis present

## 2021-12-09 DIAGNOSIS — I5032 Chronic diastolic (congestive) heart failure: Secondary | ICD-10-CM | POA: Diagnosis present

## 2021-12-09 DIAGNOSIS — R29898 Other symptoms and signs involving the musculoskeletal system: Secondary | ICD-10-CM | POA: Diagnosis present

## 2021-12-09 DIAGNOSIS — E1122 Type 2 diabetes mellitus with diabetic chronic kidney disease: Secondary | ICD-10-CM | POA: Diagnosis present

## 2021-12-09 DIAGNOSIS — T8119XA Other postprocedural shock, initial encounter: Secondary | ICD-10-CM | POA: Diagnosis not present

## 2021-12-09 DIAGNOSIS — I7143 Infrarenal abdominal aortic aneurysm, without rupture: Secondary | ICD-10-CM | POA: Diagnosis present

## 2021-12-09 DIAGNOSIS — J449 Chronic obstructive pulmonary disease, unspecified: Secondary | ICD-10-CM | POA: Diagnosis present

## 2021-12-09 DIAGNOSIS — K5900 Constipation, unspecified: Secondary | ICD-10-CM | POA: Diagnosis not present

## 2021-12-09 DIAGNOSIS — I421 Obstructive hypertrophic cardiomyopathy: Secondary | ICD-10-CM | POA: Diagnosis present

## 2021-12-09 DIAGNOSIS — G729 Myopathy, unspecified: Secondary | ICD-10-CM

## 2021-12-09 DIAGNOSIS — E1151 Type 2 diabetes mellitus with diabetic peripheral angiopathy without gangrene: Secondary | ICD-10-CM | POA: Diagnosis present

## 2021-12-09 DIAGNOSIS — I739 Peripheral vascular disease, unspecified: Secondary | ICD-10-CM

## 2021-12-09 DIAGNOSIS — T380X5A Adverse effect of glucocorticoids and synthetic analogues, initial encounter: Secondary | ICD-10-CM | POA: Diagnosis present

## 2021-12-09 LAB — CBC
HCT: 38.8 % (ref 36.0–46.0)
Hemoglobin: 12.1 g/dL (ref 12.0–15.0)
MCH: 26.7 pg (ref 26.0–34.0)
MCHC: 31.2 g/dL (ref 30.0–36.0)
MCV: 85.5 fL (ref 80.0–100.0)
Platelets: 188 10*3/uL (ref 150–400)
RBC: 4.54 MIL/uL (ref 3.87–5.11)
RDW: 16 % — ABNORMAL HIGH (ref 11.5–15.5)
WBC: 5.3 10*3/uL (ref 4.0–10.5)
nRBC: 0 % (ref 0.0–0.2)

## 2021-12-09 LAB — BASIC METABOLIC PANEL
Anion gap: 7 (ref 5–15)
BUN: 40 mg/dL — ABNORMAL HIGH (ref 8–23)
CO2: 20 mmol/L — ABNORMAL LOW (ref 22–32)
Calcium: 9 mg/dL (ref 8.9–10.3)
Chloride: 115 mmol/L — ABNORMAL HIGH (ref 98–111)
Creatinine, Ser: 2.08 mg/dL — ABNORMAL HIGH (ref 0.44–1.00)
GFR, Estimated: 24 mL/min — ABNORMAL LOW (ref >60)
Glucose, Bld: 100 mg/dL — ABNORMAL HIGH (ref 70–99)
Potassium: 4.8 mmol/L (ref 3.5–5.1)
Sodium: 142 mmol/L (ref 135–145)

## 2021-12-09 LAB — TROPONIN I (HIGH SENSITIVITY)
Troponin I (High Sensitivity): 119 ng/L (ref <18)
Troponin I (High Sensitivity): 113 ng/L (ref <18)

## 2021-12-09 LAB — URINALYSIS, ROUTINE W REFLEX MICROSCOPIC
Bilirubin Urine: NEGATIVE
Glucose, UA: 150 mg/dL — AB
Ketones, ur: NEGATIVE mg/dL
Leukocytes,Ua: NEGATIVE
Nitrite: NEGATIVE
Protein, ur: 100 mg/dL — AB
Specific Gravity, Urine: 1.012 (ref 1.005–1.030)
pH: 5 (ref 5.0–8.0)

## 2021-12-09 LAB — FOLATE: Folate: 11.7 ng/mL (ref >5.9)

## 2021-12-09 LAB — SARS CORONAVIRUS 2 BY RT PCR: SARS Coronavirus 2 by RT PCR: NEGATIVE

## 2021-12-09 MED ORDER — FOLIC ACID 1 MG PO TABS
1.0000 mg | ORAL_TABLET | Freq: Every day | ORAL | Status: DC
  Administered 2021-12-09 – 2021-12-23 (×14): 1 mg via ORAL
  Filled 2021-12-09 (×14): qty 1

## 2021-12-09 MED ORDER — SODIUM CHLORIDE 0.9% FLUSH
3.0000 mL | Freq: Two times a day (BID) | INTRAVENOUS | Status: DC
  Administered 2021-12-10 – 2021-12-23 (×25): 3 mL via INTRAVENOUS

## 2021-12-09 MED ORDER — ASPIRIN 81 MG PO CHEW
324.0000 mg | CHEWABLE_TABLET | Freq: Once | ORAL | Status: AC
  Administered 2021-12-09: 324 mg via ORAL
  Filled 2021-12-09: qty 4

## 2021-12-09 MED ORDER — ALBUTEROL SULFATE (2.5 MG/3ML) 0.083% IN NEBU: 3.0000 mL | INHALATION_SOLUTION | Freq: Four times a day (QID) | RESPIRATORY_TRACT | Status: DC | PRN

## 2021-12-09 MED ORDER — LACTATED RINGERS IV SOLN: INTRAVENOUS | Status: DC

## 2021-12-09 MED ORDER — FLUTICASONE FUROATE-VILANTEROL 200-25 MCG/ACT IN AEPB
1.0000 | INHALATION_SPRAY | Freq: Every day | RESPIRATORY_TRACT | Status: DC
  Administered 2021-12-10 – 2021-12-23 (×13): 1 via RESPIRATORY_TRACT
  Filled 2021-12-09 (×2): qty 28

## 2021-12-09 MED ORDER — ROSUVASTATIN CALCIUM 10 MG PO TABS: 40.0000 mg | ORAL_TABLET | Freq: Every day | ORAL | Status: DC

## 2021-12-09 MED ORDER — THIAMINE MONONITRATE 100 MG PO TABS
100.0000 mg | ORAL_TABLET | Freq: Every day | ORAL | Status: DC
  Administered 2021-12-09 – 2021-12-23 (×14): 100 mg via ORAL
  Filled 2021-12-09 (×14): qty 1

## 2021-12-09 MED ORDER — UMECLIDINIUM BROMIDE 62.5 MCG/ACT IN AEPB
1.0000 | INHALATION_SPRAY | Freq: Every day | RESPIRATORY_TRACT | Status: DC
  Administered 2021-12-10 – 2021-12-23 (×13): 1 via RESPIRATORY_TRACT
  Filled 2021-12-09 (×2): qty 7

## 2021-12-09 MED ORDER — ASPIRIN 81 MG PO TBEC
81.0000 mg | DELAYED_RELEASE_TABLET | Freq: Every day | ORAL | Status: DC
  Administered 2021-12-10 – 2021-12-23 (×13): 81 mg via ORAL
  Filled 2021-12-09 (×13): qty 1

## 2021-12-09 MED ORDER — AMLODIPINE BESYLATE 5 MG PO TABS
2.5000 mg | ORAL_TABLET | Freq: Every day | ORAL | Status: DC
  Administered 2021-12-10 – 2021-12-17 (×8): 2.5 mg via ORAL
  Filled 2021-12-09 (×8): qty 1

## 2021-12-09 MED ORDER — CLOPIDOGREL BISULFATE 75 MG PO TABS
75.0000 mg | ORAL_TABLET | Freq: Every day | ORAL | Status: DC
  Administered 2021-12-10 – 2021-12-14 (×5): 75 mg via ORAL
  Filled 2021-12-09 (×5): qty 1

## 2021-12-09 MED ORDER — ADULT MULTIVITAMIN W/MINERALS CH
1.0000 | ORAL_TABLET | Freq: Every day | ORAL | Status: DC
  Administered 2021-12-10 – 2021-12-23 (×13): 1 via ORAL
  Filled 2021-12-09 (×13): qty 1

## 2021-12-09 MED ORDER — ENOXAPARIN SODIUM 30 MG/0.3ML IJ SOSY
30.0000 mg | PREFILLED_SYRINGE | INTRAMUSCULAR | Status: DC
  Administered 2021-12-09 – 2021-12-10 (×2): 30 mg via SUBCUTANEOUS
  Filled 2021-12-09 (×2): qty 0.3

## 2021-12-09 MED ORDER — POLYETHYLENE GLYCOL 3350 17 G PO PACK
17.0000 g | PACK | Freq: Every day | ORAL | Status: DC | PRN
  Administered 2021-12-23: 17 g via ORAL
  Filled 2021-12-09: qty 1

## 2021-12-09 MED ORDER — SODIUM CHLORIDE 0.9 % IV BOLUS
500.0000 mL | Freq: Once | INTRAVENOUS | Status: AC
  Administered 2021-12-09: 500 mL via INTRAVENOUS

## 2021-12-09 MED ORDER — MIRABEGRON ER 25 MG PO TB24
25.0000 mg | ORAL_TABLET | Freq: Every day | ORAL | Status: DC
  Administered 2021-12-10 – 2021-12-23 (×13): 25 mg via ORAL
  Filled 2021-12-09 (×15): qty 1

## 2021-12-09 MED ORDER — ACETAMINOPHEN 650 MG RE SUPP: 650.0000 mg | Freq: Four times a day (QID) | RECTAL | Status: DC | PRN

## 2021-12-09 MED ORDER — ACETAMINOPHEN 325 MG PO TABS
650.0000 mg | ORAL_TABLET | Freq: Four times a day (QID) | ORAL | Status: DC | PRN
  Administered 2021-12-11 – 2021-12-17 (×5): 650 mg via ORAL
  Filled 2021-12-09 (×5): qty 2

## 2021-12-09 MED ORDER — MIRTAZAPINE 15 MG PO TABS
7.5000 mg | ORAL_TABLET | Freq: Every day | ORAL | Status: DC
  Administered 2021-12-09 – 2021-12-22 (×13): 7.5 mg via ORAL
  Filled 2021-12-09 (×14): qty 1

## 2021-12-09 MED ORDER — DOCUSATE SODIUM 100 MG PO CAPS
100.0000 mg | ORAL_CAPSULE | Freq: Two times a day (BID) | ORAL | Status: DC
  Administered 2021-12-09 – 2021-12-17 (×17): 100 mg via ORAL
  Filled 2021-12-09 (×17): qty 1

## 2021-12-09 MED ORDER — METOPROLOL SUCCINATE ER 50 MG PO TB24
50.0000 mg | ORAL_TABLET | Freq: Every day | ORAL | Status: DC
  Administered 2021-12-10 – 2021-12-17 (×8): 50 mg via ORAL
  Filled 2021-12-09 (×8): qty 1

## 2021-12-09 NOTE — ED Notes (Signed)
PT has not been by for evaluation - paging now.

## 2021-12-09 NOTE — Discharge Instructions (Signed)
Your lab tests today were all okay.  Be sure to eat regularly and drink enough fluids, and follow-up with your doctor for continued evaluation of your symptoms.

## 2021-12-09 NOTE — Assessment & Plan Note (Signed)
Patient has a history of prediabetes, however A1c obtained approximately 1 year ago demonstrated A1c of 6.7%.  Patient is on [place] for her heart failure.  - A1c pending - No indication for SSI at this time

## 2021-12-09 NOTE — ED Provider Notes (Signed)
Patient was discharged when I came on shift.  Nursing called the patient's group home to facilitate discharge but they expressed concern that she is not able to ambulate.  About a week ago she was ambulating and they really are not able to give her that level of care she needs if she is not able to walk.  I evaluated the patient and she tells me that she just feels like she cannot feel her legs underneath her and that they feel heavy.  Denies any upper extremity symptoms vision change numbness denies any back pain or difficulty urinating.  She has had some decreased appetite and oral intake over the last several days.  Patient does seem to have decreased strength in the lower extremities about 3 out of 5 bilaterally and symmetric.  She has no pronator drift in the upper extremities.  I tried to get her up to walk and she was barely able to sit up in the bed.  Will obtain a CT of the head does seem more global to me.  At this point because this is just been going on for about a week and patient is now unable to ambulate do not think she will be appropriate for discharge.  I placed PT consult but do think patient would benefit from admission for further work-up.   Rada Hay, MD 12/09/21 1730

## 2021-12-09 NOTE — Assessment & Plan Note (Addendum)
Patient presenting with 5-day history of bilateral lower extremity weakness with examination notable for left upper extremity weakness in addition to bilateral hip flexor weakness (L more than R). Weak rectal tone noted with inability to urine while I was in the room.  CT head is within normal limits and unfortunately MRI is contraindicated due to PPM.  Discussed with neurology, Dr. Curly Shores, who recommended CT spine study. Results obtained and negative for acute abnormalities to explain symptoms.  Lower suspicion for stroke/TIA, however patient is certainly still at risk given multiple risk factors. We will obtain additional lab studies for further evaluation.  At this point, etiology undetermined.  - Neurology following; appreciate their recommendations - TSH, B12, MMA, CK, A1c, folate and RPR pending - Telemetry monitoring - Pending evaluation by neurology, consider echocardiogram tomorrow - Continue home DAPT - PT/OT evaluation - Bladder scan  Addendum: Given elevated CK not attributable to trauma in the setting of proximal weakness, differential now includes polymyositis. Will obtain additional lab work for further evaluation and initiate Prednisone 1 mg/kg. Discontinue statin for now.

## 2021-12-09 NOTE — Assessment & Plan Note (Signed)
Patient is euvolemic at this time.  She is chest pain-free with stable EKG and troponins within her baseline range

## 2021-12-09 NOTE — ED Notes (Signed)
Tolerated water for PO challenge - provided with box lunch with MD permission.

## 2021-12-09 NOTE — H&P (Addendum)
History and Physical    Patient: Shannon Chen RXV:[id number] DOB: 01-21-1946 DOA: 12/09/2021 DOS: the patient was seen and examined on 12/10/2021 PCP: Remi Haggard, FNP  Patient coming from: ALF/ILF ([facility])  Chief Complaint:  Chief Complaint  Patient presents with   Weakness   HPI: Shannon Chen is a 76 y.o. female with medical history significant of CAD, HOCM, AV block s/p PPM, hypertension, hyperlipidemia, prior CVA who presents to the ED with complaints of bilateral lower extremity weakness.  [name] states that she was in her usual state of health up until 5 days ago when she was showering.  She noticed during the shower that she was unable to feel her legs and she was experiencing difficulty walking due to this.  She was able to make it out of the shower and since this time, her symptoms have persisted.  Due to this, she is no longer to ambulate independently.  She describes being unable to feel where her legs are and unable to lift her legs up.  She denies any numbness to touch, tingling or burning sensation.  She denies any back pain, recent falls or trauma.  She denies any recent illness including fever, chills.  She denies any rectal or genital numbness, urinary or bowel incontinence.  Of note, patient states she has had decreased appetite over the last few days, due to this has been eating/drinking less.  ED Course:  On arrival to the ED, patient was normotensive at 116/59 with heart rate of 64.  She was afebrile 97.8.  She was saturating at 95% on room air.Initial work-up remarkable for bicarb of 20, glucose of 100, BUN of 40, creatinine of 2.08.  Troponin elevated at 118 with a downtrend to 113 consistent with patient's prior troponin levels.  CBC unremarkable.  Urinalysis demonstrated large hemoglobin and proteinuria.  CT head with no acute intracranial abnormality.  MRI contraindicated due to PPM.  TRH contacted for admission for further  evaluation.  Review of Systems: As mentioned in the history of present illness. All other systems reviewed and are negative.  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 09/2016 s/p MDT [id number] Azure XT DR MRI DC PPM   CAD (coronary artery disease)    a. 11/2020 NSTEMI/Cath: LM mild diff dzs, LAD, diff dzs, LCX 30d, RCA Ca2+ w/ diff dzs throughout, 53m. FFR cath not able to advance-->Med Rx.   Carotid artery disease ([place])    a. 09/2016 Carotid U/S: RICA 71-24%, LICA 58-09%;  b. 10/8336 CTA Neck: RICA 60, RCCA 30, LICA 25%, L Vert 60, L Basilar 70; c. 10/2019 s/p R CEA.   Essential hypertension    History of stress test    a. 12/2016 MV: EF 51%, no ischemia/infarct; b. 10/2019 MV: EF 61%, no ischemia/infarct.   Hyperlipidemia    Hypertrophic cardiomyopathy ([place])    a. 09/2016 Echo: EF 60-65%, no rwma, Gr1 DD, Ca2+ MV annulus; b. 11/2017 Echo: EF 70-75%, no rwma, Gr1 DD, mild AS vs dynamic LVOT obs. Nl RV fxn. Hyperdynamic LVEF w/ sev LVH; c. 10/2019 Echo: EF 70-75%, no rwma, gr1 DD, sev LVH, Gr1 DD, nl PASP, triv MR, mild-mod Ao sclerosis; c. 05/2020 Echo: EF 60-65%; e. 11/2020 Echo: EF 60-65%, sev LVH, nl RV fxn, Mild MR. Ao Sclerosis.   Presence of permanent cardiac pacemaker    Past Surgical History:  Procedure Laterality Date  ABDOMINAL HYSTERECTOMY     CORONARY STENT INTERVENTION N/A 12/31/2020   Procedure: CORONARY STENT INTERVENTION;  Surgeon: Iran Ouch, MD;  Location: [facility] INVASIVE CV LAB;  Service: Cardiovascular;  Laterality: N/A;   ENDARTERECTOMY Right 11/16/2019   Procedure: ENDARTERECTOMY CAROTID;  Surgeon: Renford Dills, MD;  Location: [facility] ORS;  Service: Vascular;  Laterality: Right;   INTRAVASCULAR PRESSURE WIRE/FFR STUDY N/A 12/13/2020   Procedure: INTRAVASCULAR PRESSURE WIRE/FFR STUDY;  Surgeon: Marykay Lex, MD;  Location: [facility] INVASIVE CV LAB;  Service: Cardiovascular;  Laterality: N/A;   LEFT HEART CATH AND CORONARY  ANGIOGRAPHY N/A 12/13/2020   Procedure: LEFT HEART CATH AND CORONARY ANGIOGRAPHY;  Surgeon: Marykay Lex, MD;  Location: [facility] INVASIVE CV LAB;  Service: Cardiovascular;  Laterality: N/A;   LEFT HEART CATH AND CORONARY ANGIOGRAPHY N/A 12/31/2020   Procedure: LEFT HEART CATH AND CORONARY ANGIOGRAPHY;  Surgeon: Iran Ouch, MD;  Location: [facility] INVASIVE CV LAB;  Service: Cardiovascular;  Laterality: N/A;   PACEMAKER IMPLANT N/A 10/08/2016   Procedure: Pacemaker Implant;  Surgeon: Marinus Maw, MD;  Location: [facility] INVASIVE CV LAB;  Service: Cardiovascular;  Laterality: N/A;   Social History:  reports that she quit smoking about 3 years ago. Her smoking use included cigarettes. She has a 30.00 pack-year smoking history. She has never used smokeless tobacco. She reports that she does not drink alcohol and does not use drugs.  No Known Allergies  Family History  Problem Relation Age of Onset   Diabetes Mother    CAD Father     Prior to Admission medications   Medication Sig Start Date End Date Taking? Authorizing Provider  acetaminophen (TYLENOL) 325 MG tablet Take 650 mg by mouth every 4 (four) hours as needed for mild pain or moderate pain.    [provider]  albuterol (VENTOLIN HFA) 108 (90 Base) MCG/ACT inhaler Inhale 1-2 puffs into the lungs every 6 (six) hours as needed for wheezing or shortness of breath. 11/13/20   [provider]  amLODipine (NORVASC) 2.5 MG tablet Take 5 mg by mouth daily. 01/08/21   Antonieta Iba, MD  aspirin EC 81 MG EC tablet Take 1 tablet (81 mg total) by mouth daily. Swallow whole. 11/09/19   Wouk, Wilfred Curtis, MD  butalbital-acetaminophen-caffeine (FIORICET) [phone] MG tablet Take 1 tablet by mouth every 4 (four) hours as needed for headache. 01/04/21   Rolly Salter, MD  clopidogrel (PLAVIX) 75 MG tablet Take 75 mg by mouth daily. 03/29/21   [provider]  dapagliflozin propanediol (FARXIGA) 10 MG TABS tablet Take 10 mg  by mouth daily.    [provider]  docusate sodium (COLACE) 100 MG capsule Take 1 capsule (100 mg total) by mouth 2 (two) times daily. 01/04/21 01/04/22  Rolly Salter, MD  loperamide (IMODIUM) 2 MG capsule Take 2 mg by mouth as needed. 04/17/21   [provider]  meclizine (ANTIVERT) 12.5 MG tablet Take 0.5 mg by mouth as needed for dizziness.    [provider]  metoprolol succinate (TOPROL-XL) 50 MG 24 hr tablet Take 1 tablet (50 mg total) by mouth daily. 12/16/20 06/18/21  Gillis Santa, MD  mirtazapine (REMERON) 15 MG tablet Take by mouth. 05/28/21   [provider]  mirtazapine (REMERON) 7.5 MG tablet Take 7.5 mg by mouth at bedtime. 12/04/20   [provider]  polyethylene glycol (MIRALAX) 17 g packet Take 17 g by mouth daily as needed for mild constipation. 11/28/19  Alver Sorrow, NP  potassium chloride (KLOR-CON) 10 MEQ tablet Take 10 mEq by mouth daily. 03/29/21   [provider]  prochlorperazine (COMPAZINE) 10 MG tablet Take 1 tablet (10 mg total) by mouth every 6 (six) hours as needed for nausea or vomiting (or headache). 01/06/21   Chesley Noon, MD  rosuvastatin (CRESTOR) 40 MG tablet TAKE 1 TABLET BY MOUTH DAILY 12/04/20   Antonieta Iba, MD  TRELEGY ELLIPTA 200-62.5-25 MCG/ACT AEPB Inhale 1 puff into the lungs daily. 12/07/20   [provider]  Vibegron (GEMTESA) 75 MG TABS Take 75 mg by mouth daily.    [provider]    Physical Exam: Vitals:   12/09/21 1816 12/09/21 1930 12/09/21 2130 12/09/21 2249  BP: (!) 125/56 137/69 (!) 118/48 (!) 126/50  Pulse: 64 74 65 64  Resp: 20 (!) 23 18 18   Temp: 97.6 F (36.4 C)   98 F (36.7 C)  TempSrc: Oral     SpO2: 98% 97% 98% 100%  Weight:      Height:       Physical Exam Vitals and nursing note reviewed. Exam conducted with a chaperone present.  Constitutional:      General: She is not in acute distress.    Appearance: She is normal weight. She is  not toxic-appearing.  HENT:     Head: Normocephalic and atraumatic.     Mouth/Throat:     Mouth: Mucous membranes are moist.  Eyes:     General: No scleral icterus.    Extraocular Movements: Extraocular movements intact.     Conjunctiva/sclera: Conjunctivae normal.     Pupils: Pupils are equal, round, and reactive to light.  Cardiovascular:     Rate and Rhythm: Normal rate and regular rhythm.     Heart sounds: Murmur (3/6 holosystolic murmur) heard.     No gallop.  Pulmonary:     Effort: Pulmonary effort is normal. No respiratory distress.     Breath sounds: Normal breath sounds. No wheezing, rhonchi or rales.  Abdominal:     General: Bowel sounds are normal. There is no distension.     Palpations: Abdomen is soft.     Tenderness: There is no abdominal tenderness. There is no guarding.     Comments: Decreased rectal tone on DRE.  Musculoskeletal:     Cervical back: No tenderness or bony tenderness.     Thoracic back: No tenderness or bony tenderness.     Lumbar back: No tenderness or bony tenderness.     Right lower leg: No edema.     Left lower leg: No edema.  Skin:    General: Skin is warm and dry.  Neurological:     Mental Status: She is alert and oriented to person, place, and time.     Comments:  Sensation intact to both light and pinpoint touch on bilateral lower extremities. Strength:      Right upper extremity: 5/5      Left upper extremity: 4/5      Right hip flexion: 2/5      Left hip flexion: 1/5      Right knee flexion: 4/5      Left knee flexion: 4/5  Finger-to-nose within normal limits Rapid alternating movement within normal limits Unable to test gait  Psychiatric:        Mood and Affect: Mood normal.        Behavior: Behavior normal.        Thought Content: Thought content  normal.        Judgment: Judgment normal.    Data Reviewed: CBC with no leukocytosis, anemia or thrombocytopenia.  BMP remarkable for chloride of 115, CO2 of 20, BUN of 40,  creatinine of 2.08, with GFR 24.  Initial troponin elevated at 119 with decreased down to 113 on repeat.  COVID-19 PCR negative.  Chest x-ray with no acute cardiopulmonary disease  CT head with no acute intracranial abnormality.  Evidence of chronic atrophy with small vessel disease and old bilateral cerebral infarcts.  CT cervical, thoracic and lumbar spine with no evidence of spinal stenosis, fracture or mass.  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  Results notable for sinus rhythm with a rate of 64.  Deep Q waves noted in lead V1.  T wave inversion noted in lead I, 2, 3, aVL, aVF, V2-V6.  This is consistent with previous EKG in May 2023.  Right bundle branch block.  Results are pending, will review when available.  Assessment and Plan: * Generalized weakness Patient presenting with 5-day history of bilateral lower extremity weakness with examination notable for left upper extremity weakness in addition to bilateral hip flexor weakness (L more than R). Weak rectal tone noted with inability to urine while I was in the room.  CT head is within normal limits and unfortunately MRI is contraindicated due to PPM.  Discussed with neurology, Dr. Iver NestleBhagat, who recommended CT spine study. Results obtained and negative for acute abnormalities to explain symptoms.  Lower suspicion for stroke/TIA, however patient is certainly still at risk given multiple risk factors. We will obtain additional lab studies for further evaluation.  At this point, etiology undetermined.  - Neurology following; appreciate their recommendations - TSH, B12, MMA, CK, A1c, folate and RPR pending - Telemetry monitoring - Pending evaluation by neurology, consider echocardiogram tomorrow - Continue home DAPT - PT/OT evaluation - Bladder scan  Addendum: Given elevated CK not attributable to trauma in the setting of proximal weakness,  differential now includes polymyositis. Will obtain additional lab work for further evaluation and initiate Prednisone 1 mg/kg. Discontinue statin for now.   Acute kidney injury superimposed on CKD ([name]) Likely multifactorial in the setting of continued diuretic use in the setting of poor p.o. intake although her renal function has been gradually worsening over the past 1 year.  Per cardiology note, patient was instructed to hold her finerenone however ALF has continued to administer with last dose on 10/23.  - Gentle hydration overnight with LR at 75 cc/h - Repeat BMP in the morning  Hypertension - Restart home metoprolol - Holding home finerenone due to AKI  Hypertrophic cardiomyopathy ([name]) Patient is euvolemic at this time.  She is chest pain-free with stable EKG and troponins within her baseline range  Type 2 diabetes mellitus with hyperlipidemia ([name]) Patient has a history of prediabetes, however A1c obtained approximately 1 year ago demonstrated A1c of 6.7%.  Patient is on [place] for her heart failure.  - A1c pending - No indication for SSI at this time  Chronic obstructive pulmonary disease ([name]) No wheezing on examination.  - Continue home bronchodilators   Advance Care Planning:   Code Status: Full Code   Consults: Neurology  Family Communication: No family at bedside to update  Severity of Illness: The appropriate patient status for this patient is OBSERVATION. Observation status is judged to be reasonable and necessary  in order to provide the required intensity of service to ensure the patient's safety. The patient's presenting symptoms, physical exam findings, and initial radiographic and laboratory data in the context of their medical condition is felt to place them at decreased risk for further clinical deterioration. Furthermore, it is anticipated that the patient will be medically stable for discharge from the hospital within 2 midnights of admission.    Author: Verdene Lennert, MD 12/10/2021 1:47 AM  For on call review [email or web address].

## 2021-12-09 NOTE — ED Triage Notes (Signed)
Pt from [place] home via ems with reports of generalized weakness x 1 week, also reports bilt leg pain. Not been eating and drinking like normal.  CBG- 129 129/65.

## 2021-12-09 NOTE — ED Notes (Signed)
Called facility to coordinate [place], facility staff member expressing concern that patient may not be appropriate to return back to facility due to lack of ability to ambulate at her baseline.   Notified Dr. Starleen Blue - see new orders for PT consult.

## 2021-12-09 NOTE — Assessment & Plan Note (Signed)
-   Restart home metoprolol - Holding home finerenone due to AKI

## 2021-12-09 NOTE — ED Notes (Signed)
MD at bedside - attempted to stand patient - pt weak and unable to walk. Plan for admission.

## 2021-12-09 NOTE — ED Notes (Signed)
Given water with MD permission.

## 2021-12-09 NOTE — ED Notes (Signed)
Dr. Joni Fears aware of troponin 113

## 2021-12-09 NOTE — Assessment & Plan Note (Addendum)
Likely multifactorial in the setting of continued diuretic use in the setting of poor p.o. intake although her renal function has been gradually worsening over the past 1 year.  Per cardiology note, patient was instructed to hold her finerenone however ALF has continued to administer with last dose on 10/23.  - Gentle hydration overnight with LR at 75 cc/h - Repeat BMP in the morning

## 2021-12-09 NOTE — ED Notes (Signed)
Called and left voicemail with [place], facility staff member at [facility] updated on POC.

## 2021-12-09 NOTE — Progress Notes (Signed)
Anticoagulation monitoring(Lovenox):  76yo  F ordered Lovenox 40 mg Q24h    Filed Weights   12/09/21 0952  Weight: 64.9 kg (143 lb)   BMI 23   Lab Results  Component Value Date   CREATININE 2.08 (H) 12/09/2021   CREATININE 1.66 (H) 11/13/2021   CREATININE 1.28 (H) 04/08/2021   Estimated Creatinine Clearance: 20.5 mL/min (A) (by C-G formula based on SCr of 2.08 mg/dL (H)). Hemoglobin & Hematocrit     Component Value Date/Time   HGB 12.1 12/09/2021 1049   HCT 38.8 12/09/2021 1049     Per Protocol for Patient with estCrcl < 30 ml/min and BMI < 30, will transition to Lovenox 30 mg Q24h      Chinita Greenland PharmD Clinical Pharmacist 12/09/2021

## 2021-12-09 NOTE — ED Notes (Signed)
Arrives from care facility c/o 1 week history of generalized weakness and BLE pains without injury.   PT reports appetite was OK until yesterday and she hasn't ate or drank much since then. Pt requires 2 person assist to get into gurney.

## 2021-12-09 NOTE — Assessment & Plan Note (Signed)
No wheezing on examination.  -Continue home bronchodilators 

## 2021-12-09 NOTE — ED Provider Notes (Signed)
[facility] Provider Note    Event Date/Time   First MD Initiated Contact with Patient 12/09/21 1000     (approximate)   History   Chief Complaint: Weakness   HPI  Shannon Chen is a 76 y.o. female with a history of hypertension, diabetes, atrial fibrillation who is sent to the ED from her group home today due to generalized weakness for the past few weeks.  Patient reports that she feels like her legs are too heavy to walk.  She is also had decreased appetite and oral intake for the past several days.  No falls or trauma.  No fever vomiting diarrhea or focal pain complaints.  No dysuria cough or shortness of breath.     Physical Exam   Triage Vital Signs: ED Triage Vitals  Enc Vitals Group     BP 12/09/21 0954 (!) 116/59     Pulse Rate 12/09/21 0954 64     Resp 12/09/21 0954 20     Temp 12/09/21 0954 97.8 F (36.6 C)     Temp Source 12/09/21 0954 Oral     SpO2 12/09/21 0954 95 %     Weight 12/09/21 0952 143 lb (64.9 kg)     Height 12/09/21 0952 5' 5.5" (1.664 m)     Head Circumference --      Peak Flow --      Pain Score 12/09/21 0952 2     Pain Loc --      Pain Edu? --      Excl. in [place]? --     Most recent vital signs: Vitals:   12/09/21 1330 12/09/21 1430  BP: (!) 109/51 (!) 119/46  Pulse: 64 66  Resp: 15 14  Temp:    SpO2: 98% 99%    General: Awake, no distress.  CV:  Good peripheral perfusion.  Regular rate and rhythm Resp:  Normal effort.  Clear to auscultation bilaterally Abd:  No distention.  Soft nontender Other:  Legs nontender, Symmetric, no significant peripheral edema.  No wounds.  No soft tissue inflammatory changes.  Dry oral mucosa.  ED Results / Procedures / Treatments   Labs (all labs ordered are listed, but only abnormal results are displayed) Labs Reviewed  BASIC METABOLIC PANEL - Abnormal; Notable for the following components:      Result Value   Chloride 115 (*)    CO2 20 (*)    Glucose, Bld 100 (*)     BUN 40 (*)    Creatinine, Ser 2.08 (*)    GFR, Estimated 24 (*)    All other components within normal limits  CBC - Abnormal; Notable for the following components:   RDW 16.0 (*)    All other components within normal limits  URINALYSIS, ROUTINE W REFLEX MICROSCOPIC - Abnormal; Notable for the following components:   Color, Urine YELLOW (*)    APPearance HAZY (*)    Glucose, UA 150 (*)    Hgb urine dipstick LARGE (*)    Protein, ur 100 (*)    Bacteria, UA RARE (*)    All other components within normal limits  TROPONIN I (HIGH SENSITIVITY) - Abnormal; Notable for the following components:   Troponin I (High Sensitivity) 119 (*)    All other components within normal limits  TROPONIN I (HIGH SENSITIVITY) - Abnormal; Notable for the following components:   Troponin I (High Sensitivity) 113 (*)    All other components within normal limits  CBG MONITORING, ED  TROPONIN I (HIGH SENSITIVITY)     EKG Interpreted by me Sinus rhythm rate of 64.  Normal axis, right bundle branch block.  Normal ST segments.  Diffuse T wave inversions.   RADIOLOGY    PROCEDURES:  Procedures   MEDICATIONS ORDERED IN ED: Medications  sodium chloride 0.9 % bolus 500 mL (500 mLs Intravenous New Bag/Given 12/09/21 1353)     IMPRESSION / MDM / ASSESSMENT AND PLAN / ED COURSE  I reviewed the triage vital signs and the nursing notes.                              Differential diagnosis includes, but is not limited to, dehydration, AKI, electrolyte abnormality, anemia, UTI, non-STEMI  Patient's presentation is most consistent with acute presentation with potential threat to life or bodily function.  Patient presents with generalized weakness without focal symptoms.  Vital signs are unremarkable.  Exam is also reassuring.  EKG shows T wave inversions, but patient is not having any cardiac symptoms, and 2 high-sensitivity troponins are stable and consistent with her chronic baseline.  I doubt ACS PE  dissection pericardial effusion or infection.  I think her symptoms are due to some mild dehydration and deconditioning.  She was given some fluids in the ED and does feel a bit better.  She reports that she is already being set up for physical therapy at home.  I think she is stable for discharge and continued outpatient management.       FINAL CLINICAL IMPRESSION(S) / ED DIAGNOSES   Final diagnoses:  Generalized weakness     Rx / DC Orders   ED Discharge Orders     None        Note:  This document was prepared using Dragon voice recognition software and may include unintentional dictation errors.   Sharman Cheek, MD 12/09/21 1505

## 2021-12-09 NOTE — ED Notes (Signed)
Updated Dest[place] (facility staff) at [facility] group home - [phone]

## 2021-12-09 NOTE — ED Notes (Signed)
Given food and drink with MD permission.

## 2021-12-09 NOTE — ED Triage Notes (Signed)
Pt via EMS from [facility]. Pt c/o generalized weakness and pain in both of her legs. Pt is A&Ox4 and NAD

## 2021-12-10 DIAGNOSIS — M3320 Polymyositis, organ involvement unspecified: Secondary | ICD-10-CM | POA: Diagnosis not present

## 2021-12-10 DIAGNOSIS — G729 Myopathy, unspecified: Secondary | ICD-10-CM | POA: Diagnosis not present

## 2021-12-10 DIAGNOSIS — N2889 Other specified disorders of kidney and ureter: Secondary | ICD-10-CM | POA: Diagnosis not present

## 2021-12-10 DIAGNOSIS — I130 Hypertensive heart and chronic kidney disease with heart failure and stage 1 through stage 4 chronic kidney disease, or unspecified chronic kidney disease: Secondary | ICD-10-CM | POA: Diagnosis present

## 2021-12-10 DIAGNOSIS — N189 Chronic kidney disease, unspecified: Secondary | ICD-10-CM | POA: Diagnosis present

## 2021-12-10 DIAGNOSIS — I714 Abdominal aortic aneurysm, without rupture, unspecified: Secondary | ICD-10-CM | POA: Diagnosis not present

## 2021-12-10 DIAGNOSIS — R531 Weakness: Secondary | ICD-10-CM | POA: Diagnosis present

## 2021-12-10 DIAGNOSIS — I4891 Unspecified atrial fibrillation: Secondary | ICD-10-CM | POA: Diagnosis present

## 2021-12-10 DIAGNOSIS — Y838 Other surgical procedures as the cause of abnormal reaction of the patient, or of later complication, without mention of misadventure at the time of the procedure: Secondary | ICD-10-CM | POA: Diagnosis present

## 2021-12-10 DIAGNOSIS — N179 Acute kidney failure, unspecified: Secondary | ICD-10-CM | POA: Diagnosis present

## 2021-12-10 DIAGNOSIS — I5032 Chronic diastolic (congestive) heart failure: Secondary | ICD-10-CM | POA: Diagnosis present

## 2021-12-10 DIAGNOSIS — E872 Acidosis, unspecified: Secondary | ICD-10-CM | POA: Diagnosis not present

## 2021-12-10 DIAGNOSIS — G130 Paraneoplastic neuromyopathy and neuropathy: Secondary | ICD-10-CM | POA: Diagnosis not present

## 2021-12-10 DIAGNOSIS — I772 Rupture of artery: Secondary | ICD-10-CM | POA: Diagnosis present

## 2021-12-10 DIAGNOSIS — I421 Obstructive hypertrophic cardiomyopathy: Secondary | ICD-10-CM | POA: Diagnosis present

## 2021-12-10 DIAGNOSIS — J449 Chronic obstructive pulmonary disease, unspecified: Secondary | ICD-10-CM | POA: Diagnosis present

## 2021-12-10 DIAGNOSIS — E1151 Type 2 diabetes mellitus with diabetic peripheral angiopathy without gangrene: Secondary | ICD-10-CM | POA: Diagnosis present

## 2021-12-10 DIAGNOSIS — I70223 Atherosclerosis of native arteries of extremities with rest pain, bilateral legs: Secondary | ICD-10-CM | POA: Diagnosis not present

## 2021-12-10 DIAGNOSIS — E119 Type 2 diabetes mellitus without complications: Secondary | ICD-10-CM | POA: Diagnosis not present

## 2021-12-10 DIAGNOSIS — I70203 Unspecified atherosclerosis of native arteries of extremities, bilateral legs: Secondary | ICD-10-CM | POA: Diagnosis not present

## 2021-12-10 DIAGNOSIS — I442 Atrioventricular block, complete: Secondary | ICD-10-CM | POA: Diagnosis present

## 2021-12-10 DIAGNOSIS — E1140 Type 2 diabetes mellitus with diabetic neuropathy, unspecified: Secondary | ICD-10-CM | POA: Diagnosis present

## 2021-12-10 DIAGNOSIS — E1122 Type 2 diabetes mellitus with diabetic chronic kidney disease: Secondary | ICD-10-CM | POA: Diagnosis present

## 2021-12-10 DIAGNOSIS — I7143 Infrarenal abdominal aortic aneurysm, without rupture: Secondary | ICD-10-CM | POA: Diagnosis present

## 2021-12-10 DIAGNOSIS — I723 Aneurysm of iliac artery: Secondary | ICD-10-CM | POA: Diagnosis present

## 2021-12-10 DIAGNOSIS — Z01810 Encounter for preprocedural cardiovascular examination: Secondary | ICD-10-CM | POA: Diagnosis not present

## 2021-12-10 DIAGNOSIS — D62 Acute posthemorrhagic anemia: Secondary | ICD-10-CM | POA: Diagnosis not present

## 2021-12-10 DIAGNOSIS — T8119XA Other postprocedural shock, initial encounter: Secondary | ICD-10-CM | POA: Diagnosis not present

## 2021-12-10 DIAGNOSIS — D638 Anemia in other chronic diseases classified elsewhere: Secondary | ICD-10-CM | POA: Diagnosis present

## 2021-12-10 DIAGNOSIS — R29898 Other symptoms and signs involving the musculoskeletal system: Secondary | ICD-10-CM | POA: Diagnosis present

## 2021-12-10 DIAGNOSIS — M6282 Rhabdomyolysis: Secondary | ICD-10-CM | POA: Diagnosis present

## 2021-12-10 DIAGNOSIS — E860 Dehydration: Secondary | ICD-10-CM | POA: Diagnosis present

## 2021-12-10 DIAGNOSIS — G7249 Other inflammatory and immune myopathies, not elsewhere classified: Secondary | ICD-10-CM | POA: Diagnosis present

## 2021-12-10 DIAGNOSIS — Z1152 Encounter for screening for COVID-19: Secondary | ICD-10-CM | POA: Diagnosis not present

## 2021-12-10 DIAGNOSIS — I10 Essential (primary) hypertension: Secondary | ICD-10-CM | POA: Diagnosis not present

## 2021-12-10 DIAGNOSIS — I700 Atherosclerosis of aorta: Secondary | ICD-10-CM | POA: Diagnosis present

## 2021-12-10 DIAGNOSIS — E1169 Type 2 diabetes mellitus with other specified complication: Secondary | ICD-10-CM | POA: Diagnosis present

## 2021-12-10 LAB — CBC WITH DIFFERENTIAL/PLATELET
Abs Immature Granulocytes: 0.01 10*3/uL (ref 0.00–0.07)
Basophils Absolute: 0 10*3/uL (ref 0.0–0.1)
Basophils Relative: 1 %
Eosinophils Absolute: 0.1 10*3/uL (ref 0.0–0.5)
Eosinophils Relative: 1 %
HCT: 35.7 % — ABNORMAL LOW (ref 36.0–46.0)
Hemoglobin: 11.1 g/dL — ABNORMAL LOW (ref 12.0–15.0)
Immature Granulocytes: 0 %
Lymphocytes Relative: 32 %
Lymphs Abs: 1.6 10*3/uL (ref 0.7–4.0)
MCH: 26.3 pg (ref 26.0–34.0)
MCHC: 31.1 g/dL (ref 30.0–36.0)
MCV: 84.6 fL (ref 80.0–100.0)
Monocytes Absolute: 0.5 10*3/uL (ref 0.1–1.0)
Monocytes Relative: 10 %
Neutro Abs: 2.7 10*3/uL (ref 1.7–7.7)
Neutrophils Relative %: 56 %
Platelets: 176 10*3/uL (ref 150–400)
RBC: 4.22 MIL/uL (ref 3.87–5.11)
RDW: 15.9 % — ABNORMAL HIGH (ref 11.5–15.5)
WBC: 4.8 10*3/uL (ref 4.0–10.5)
nRBC: 0 % (ref 0.0–0.2)

## 2021-12-10 LAB — HEMOGLOBIN A1C
Hgb A1c MFr Bld: 6.4 % — ABNORMAL HIGH (ref 4.8–5.6)
Mean Plasma Glucose: 136.98 mg/dL

## 2021-12-10 LAB — BASIC METABOLIC PANEL
Anion gap: 6 (ref 5–15)
BUN: 34 mg/dL — ABNORMAL HIGH (ref 8–23)
CO2: 19 mmol/L — ABNORMAL LOW (ref 22–32)
Calcium: 8.3 mg/dL — ABNORMAL LOW (ref 8.9–10.3)
Chloride: 116 mmol/L — ABNORMAL HIGH (ref 98–111)
Creatinine, Ser: 1.61 mg/dL — ABNORMAL HIGH (ref 0.44–1.00)
GFR, Estimated: 33 mL/min — ABNORMAL LOW (ref >60)
Glucose, Bld: 83 mg/dL (ref 70–99)
Potassium: 4.3 mmol/L (ref 3.5–5.1)
Sodium: 141 mmol/L (ref 135–145)

## 2021-12-10 LAB — SEDIMENTATION RATE: Sed Rate: 37 mm/hr — ABNORMAL HIGH (ref 0–30)

## 2021-12-10 LAB — TSH: TSH: 1.225 u[IU]/mL (ref 0.350–4.500)

## 2021-12-10 LAB — CK
Total CK: 6906 U/L — ABNORMAL HIGH (ref 38–234)
Total CK: 6635 U/L — ABNORMAL HIGH (ref 38–234)
Total CK: 6079 U/L — ABNORMAL HIGH (ref 38–234)

## 2021-12-10 LAB — RPR: RPR Ser Ql: NONREACTIVE

## 2021-12-10 LAB — VITAMIN B12: Vitamin B-12: 339 pg/mL (ref 180–914)

## 2021-12-10 LAB — C-REACTIVE PROTEIN: CRP: 0.5 mg/dL (ref <1.0)

## 2021-12-10 MED ORDER — TRAZODONE HCL 50 MG PO TABS: 50.0000 mg | ORAL_TABLET | Freq: Every evening | ORAL | Status: DC | PRN

## 2021-12-10 MED ORDER — VITAMIN B-12 1000 MCG PO TABS
1000.0000 ug | ORAL_TABLET | Freq: Every day | ORAL | Status: DC
  Administered 2021-12-11 – 2021-12-23 (×12): 1000 ug via ORAL
  Filled 2021-12-10 (×12): qty 1

## 2021-12-10 MED ORDER — HYDRALAZINE HCL 20 MG/ML IJ SOLN
10.0000 mg | INTRAMUSCULAR | Status: DC | PRN
  Administered 2021-12-12: 10 mg via INTRAVENOUS
  Filled 2021-12-10: qty 1

## 2021-12-10 MED ORDER — METOPROLOL TARTRATE 5 MG/5ML IV SOLN: 5.0000 mg | INTRAVENOUS | Status: DC | PRN

## 2021-12-10 MED ORDER — DAPAGLIFLOZIN PROPANEDIOL 10 MG PO TABS: 10.0000 mg | ORAL_TABLET | Freq: Every day | ORAL | Status: DC

## 2021-12-10 MED ORDER — LACTATED RINGERS IV SOLN: INTRAVENOUS | Status: AC

## 2021-12-10 MED ORDER — GUAIFENESIN 100 MG/5ML PO LIQD: 5.0000 mL | ORAL | Status: DC | PRN

## 2021-12-10 MED ORDER — MECLIZINE HCL 25 MG PO TABS: 12.5000 mg | ORAL_TABLET | Freq: Two times a day (BID) | ORAL | Status: DC | PRN

## 2021-12-10 MED ORDER — PREDNISONE 50 MG PO TABS
65.0000 mg | ORAL_TABLET | Freq: Every day | ORAL | Status: DC
  Administered 2021-12-10 – 2021-12-23 (×13): 65 mg via ORAL
  Filled 2021-12-10: qty 1
  Filled 2021-12-10: qty 2
  Filled 2021-12-10 (×9): qty 1
  Filled 2021-12-10 (×2): qty 2

## 2021-12-10 MED ORDER — IPRATROPIUM-ALBUTEROL 0.5-2.5 (3) MG/3ML IN SOLN: 3.0000 mL | RESPIRATORY_TRACT | Status: DC | PRN

## 2021-12-10 NOTE — Evaluation (Signed)
Physical Therapy Evaluation Patient Details Name: Shannon Chen MRN: [id number] DOB: 1945-11-02 Today's Date: 12/10/2021  History of Present Illness  Patient is a 76 year old female with medical history significant of CAD, HOCM, AV block s/p PPM, hypertension, hyperlipidemia, prior CVA who presents to the ED with complaints of bilateral lower extremity weakness. MRI contraindicated due to PPM. CT head with no acute intracranial abnormality.  Clinical Impression  Patient is agreeable to PT evaluation. She reports she is typically independent with ambulation, although she has a cane and rolling walker if needed at the group home. She reports only recently requiring assistance for ADLs due to weakness.   Today, the patient has proximal LE weakness on exam. She required maximal assistance for bed mobility. Moderate assistance is required for standing and to perform stand step transfer from bed to chair. Facilitation for hip extension with standing bouts. She is unable to ambulate due to weakness and poor standing tolerance. Recommend PT follow up to maximize independence and facilitate return to prior level of function. At this time, recommend short term rehab at [facility] before returning to the group home.    Recommendations for follow up therapy are one component of a multi-disciplinary discharge planning process, led by the attending physician.  Recommendations may be updated based on patient status, additional functional criteria and insurance authorization.  Follow Up Recommendations Skilled nursing-short term rehab (<3 hours/day) Can patient physically be transported by private vehicle: No    Assistance Recommended at Discharge Frequent or constant Supervision/Assistance  Patient can return home with the following  Two people to help with walking and/or transfers;A lot of help with bathing/dressing/bathroom;Direct supervision/assist for medications management;Assist for transportation;Help with  stairs or ramp for entrance    Equipment Recommendations None recommended by PT  Recommendations for Other Services       Functional Status Assessment Patient has had a recent decline in their functional status and demonstrates the ability to make significant improvements in function in a reasonable and predictable amount of time.     Precautions / Restrictions Precautions Precautions: Fall Restrictions Weight Bearing Restrictions: No      Mobility  Bed Mobility Overal bed mobility: Needs Assistance Bed Mobility: Supine to Sit     Supine to sit: Max assist     General bed mobility comments: assistance for trunk and for scooting pelvis forward to edge of bed. cues for technique    Transfers Overall transfer level: Needs assistance Equipment used: Rolling walker (2 wheels) Transfers: Sit to/from Stand, Bed to chair/wheelchair/BSC Sit to Stand: Mod assist   Step pivot transfers: Mod assist       General transfer comment: 2 standing bouts performed from bed. significant lifting assistance required to achieve upright standing position with faciliation for hip extension.    Ambulation/Gait               General Gait Details: unable to attempt due to LE weakness and poor standing tolerance  Stairs            Wheelchair Mobility    Modified Rankin (Stroke Patients Only)       Balance Overall balance assessment: Needs assistance Sitting-balance support: Feet supported Sitting balance-Leahy Scale: Poor Sitting balance - Comments: posterior lean with dynamic activity that requires assistance to maintain midline. static sitting balance with no trunk lean   Standing balance support: Bilateral upper extremity supported, Reliant on assistive device for balance Standing balance-Leahy Scale: Poor Standing balance comment: patient relying heavily on RW  for UE support. external support required to maintain standing balance                              Pertinent Vitals/Pain Pain Assessment Pain Assessment: No/denies pain    Home Living Family/patient expects to be discharged to:: Group home                        Prior Function Prior Level of Function : Needs assist             Mobility Comments: patient has rolling walker and cane but rarely uses them. typically independent with mobility ADLs Comments: she reports needing assistance with ADLs recently due to weakness, otherwise can perform ADL independently. she reports medication and meal assistance at the group home     Hand Dominance   Dominant Hand: Right    Extremity/Trunk Assessment   Upper Extremity Assessment Upper Extremity Assessment:  (proximal muscle weakness noted shoulder flexion (4/5). decreased grip strength in L compared to right. sensation appears intact. see OT note for more details)    Lower Extremity Assessment Lower Extremity Assessment: RLE deficits/detail;LLE deficits/detail RLE Deficits / Details: hip flexion 2+/5, hip add/abd 4/5, knee extension 4+/5, dorsiflexion and plantarflexion 5/5 RLE Sensation: WNL LLE Deficits / Details: hip flexion 2+/5, hip add/abd 4/5, knee extension 4+/5, dorsiflexion and plantarflexion 5/5 LLE Sensation: WNL       Communication   Communication: No difficulties  Cognition Arousal/Alertness: Awake/alert Behavior During Therapy: WFL for tasks assessed/performed Overall Cognitive Status: Within Functional Limits for tasks assessed                                 General Comments: patient able to follow single step commands without diffculty        General Comments      Exercises     Assessment/Plan    PT Assessment Patient needs continued PT services  PT Problem List Decreased strength;Decreased range of motion;Decreased activity tolerance;Decreased balance;Decreased mobility       PT Treatment Interventions DME instruction;Gait training;Stair training;Functional mobility  training;Therapeutic activities;Balance training;Therapeutic exercise;Neuromuscular re-education;Patient/family education    PT Goals (Current goals can be found in the Care Plan section)  Acute Rehab PT Goals Patient Stated Goal: to be able to walk PT Goal Formulation: With patient Time For Goal Achievement: 12/24/21 Potential to Achieve Goals: Fair    Frequency Min 2X/week     Co-evaluation               AM-PAC PT "6 Clicks" Mobility  Outcome Measure Help needed turning from your back to your side while in a flat bed without using bedrails?: A Lot Help needed moving from lying on your back to sitting on the side of a flat bed without using bedrails?: A Lot Help needed moving to and from a bed to a chair (including a wheelchair)?: A Lot Help needed standing up from a chair using your arms (e.g., wheelchair or bedside chair)?: A Lot Help needed to walk in hospital room?: A Lot Help needed climbing 3-5 steps with a railing? : Total 6 Click Score: 11    End of Session   Activity Tolerance: Patient tolerated treatment well;Patient limited by fatigue Patient left: in chair;with call bell/phone within reach (OT in the room) Nurse Communication: Mobility status PT Visit Diagnosis: Unsteadiness on feet (R26.81);Muscle  weakness (generalized) (M62.81)    Time: 3903-0092 PT Time Calculation (min) (ACUTE ONLY): 23 min   Charges:   PT Evaluation $PT Eval Low Complexity: 1 Low PT Treatments $Therapeutic Activity: 8-22 mins        Donna Bernard, PT, MPT   Ina Homes 12/10/2021, 10:01 AM

## 2021-12-10 NOTE — Progress Notes (Signed)
PROGRESS NOTE    Shannon Chen  GQQ:[id number] DOB: 06/30/1945 DOA: 12/09/2021 PCP: Remi Haggard, FNP   Brief Narrative:  76 year old with history of CAD, HOCM, AV block status post PPM, HTN, HLD, prior CVA admitted for bilateral lower extremity weakness..  About 5 days ago she started experiencing bilateral lower extremity weakness and difficulty walking.  In the ED she was found to have rhabdomyolysis.  CT of the head, cervical thoracic and lumbar spine did not show any acute pathology.   Assessment & Plan:  Principal Problem:   Generalized weakness Active Problems:   Acute kidney injury superimposed on CKD ([name])   Hypertension   Hypertrophic cardiomyopathy ([name])   Type 2 diabetes mellitus with hyperlipidemia ([name])   Chronic obstructive pulmonary disease ([name])     Assessment and Plan: *Bilateral lower extremity weakness Rhabdomyolysis.  Concerns of polymyositis Sudden onset about 5 days ago.  CT head, cervical, lumbar and thoracic spine are unremarkable.  Unable to get MRI due to pacemaker.  Neurology team consulted.  CK elevated at 07/26/2004, folate, B12, TSH normal, UA negative.  ANA, CRP currently pending.  ESR elevated.  Due to concerns of polymyositis, started on prednisone 1 mg/kg daily but we can opt for higher dose Solumedrol 1g daily x 3 days.  Statin on hold.  If no improvement, wonder if she will EMG/Muscle Bx.  Anti Jo IgG ordered.   Acute kidney injury superimposed on CKD ([name]) Baseline creatinine 0.9.  Admission creatinine 2.08, slowly improving with IV fluids.  This morning 1.61.  Hypertension - Norvasc, Toprol-XL IV as needed ordered - Holding home finerenone due to AKI  Hypertrophic cardiomyopathy ([place]) Patient is euvolemic at this time.  She is chest pain-free with stable EKG and troponins within her baseline range  Type 2 diabetes mellitus with hyperlipidemia [facility]) She is on [place] for heart failure.  Hemoglobin A1c 6.4 Sliding scale and  Accu-Cheks  Chronic obstructive pulmonary disease ([place]) As needed home bronchodilators   PT/OT-SNF   DVT prophylaxis: Lovenox Code Status: Full code Family Communication:    Remains in hospital stay due to persistent bilateral lower extremity weakness    Subjective: Still has persistent bilateral lower extremity weakness.  Overall sensation is intact.  The symptoms started 5-6 days ago and has slowly progressively worsened.  Examination:  General exam: Appears calm and comfortable  Respiratory system: Clear to auscultation. Respiratory effort normal. Cardiovascular system: S1 & S2 heard, RRR. No JVD, murmurs, rubs, gallops or clicks. No pedal edema. Gastrointestinal system: Abdomen is nondistended, soft and nontender. No organomegaly or masses felt. Normal bowel sounds heard. Central nervous system: Alert and oriented. No focal neurological deficits. Extremities: Mild right lower extremity strength 3/5.  Bilateral upper extremity strength 5/5. Skin: No rashes, lesions or ulcers Psychiatry: Judgement and insight appear normal. Mood & affect appropriate.     Objective: Vitals:   12/09/21 2130 12/09/21 2249 12/10/21 0413 12/10/21 0806  BP: (!) 118/48 (!) 126/50 (!) 149/64 (!) 151/63  Pulse: 65 64 63 71  Resp: '18 18 17 18  ' Temp:  98 F (36.7 C) (!) 97.4 F (36.3 C) 97.7 F (36.5 C)  TempSrc:    Oral  SpO2: 98% 100% 100% 99%  Weight:      Height:        Intake/Output Summary (Last 24 hours) at 12/10/2021 0826 Last data filed at 12/10/2021 0500 Gross per 24 hour  Intake 500 ml  Output 600 ml  Net -100 ml   Filed  Weights   12/09/21 0952  Weight: 64.9 kg     Data Reviewed:   CBC: Recent Labs  Lab 12/09/21 1049 12/10/21 0348  WBC 5.3 4.8  NEUTROABS  --  2.7  HGB 12.1 11.1*  HCT 38.8 35.7*  MCV 85.5 84.6  PLT 188 195   Basic Metabolic Panel: Recent Labs  Lab 12/09/21 1049 12/10/21 0348  NA 142 141  K 4.8 4.3  CL 115* 116*  CO2 20* 19*  GLUCOSE  100* 83  BUN 40* 34*  CREATININE 2.08* 1.61*  CALCIUM 9.0 8.3*   GFR: Estimated Creatinine Clearance: 26.5 mL/min (A) (by C-G formula based on SCr of 1.61 mg/dL (H)). Liver Function Tests: No results for input(s): "AST", "ALT", "ALKPHOS", "BILITOT", "PROT", "ALBUMIN" in the last 168 hours. No results for input(s): "LIPASE", "AMYLASE" in the last 168 hours. No results for input(s): "AMMONIA" in the last 168 hours. Coagulation Profile: No results for input(s): "INR", "PROTIME" in the last 168 hours. Cardiac Enzymes: Recent Labs  Lab 12/09/21 2211 12/10/21 0348  CKTOTAL 6,906* 6,635*   BNP (last 3 results) No results for input(s): "PROBNP" in the last 8760 hours. HbA1C: Recent Labs    12/09/21 2211  HGBA1C 6.4*   CBG: No results for input(s): "GLUCAP" in the last 168 hours. Lipid Profile: No results for input(s): "CHOL", "HDL", "LDLCALC", "TRIG", "CHOLHDL", "LDLDIRECT" in the last 72 hours. Thyroid Function Tests: Recent Labs    12/09/21 2211  TSH 1.225   Anemia Panel: Recent Labs    12/09/21 2211  VITAMINB12 339  FOLATE 11.7   Sepsis Labs: No results for input(s): "PROCALCITON", "LATICACIDVEN" in the last 168 hours.  Recent Results (from the past 240 hour(s))  SARS Coronavirus 2 by RT PCR (hospital order, performed in [facility] hospital lab) *cepheid single result test* Anterior Nasal Swab     Status: None   Collection Time: 12/09/21  6:14 PM   Specimen: Anterior Nasal Swab  Result Value Ref Range Status   SARS Coronavirus 2 by RT PCR NEGATIVE NEGATIVE Final    Comment: (NOTE) SARS-CoV-2 target nucleic acids are NOT DETECTED.  The SARS-CoV-2 RNA is generally detectable in upper and lower respiratory specimens during the acute phase of infection. The lowest concentration of SARS-CoV-2 viral copies this assay can detect is 250 copies / mL. A negative result does not preclude SARS-CoV-2 infection and should not be used as the sole basis for treatment or  other patient management decisions.  A negative result may occur with improper specimen collection / handling, submission of specimen other than nasopharyngeal swab, presence of viral mutation(s) within the areas targeted by this assay, and inadequate number of viral copies (<250 copies / mL). A negative result must be combined with clinical observations, patient history, and epidemiological information.  Fact Sheet for Patients:   https://www.patel.info/  Fact Sheet for Healthcare Providers: https://hall.com/  This test is not yet approved or  cleared by the [place] FDA and has been authorized for detection and/or diagnosis of SARS-CoV-2 by FDA under an Emergency Use Authorization (EUA).  This EUA will remain in effect (meaning this test can be used) for the duration of the COVID-19 declaration under Section 564(b)(1) of the Act, 21 U.S.C. section 360bbb-3(b)(1), unless the authorization is terminated or revoked sooner.  Performed at [facility], [address] [place], [place] [address]          Radiology Studies: CT CERVICAL SPINE WO CONTRAST  Result Date: 12/09/2021 CLINICAL DATA:  Myelopathy, acute, cervical spine; Myelopathy, acute, lumbar spine; Myelopathy, acute, thoracic spine EXAM: CT CERVICAL, THORACIC, AND LUMBAR SPINE WITHOUT CONTRAST TECHNIQUE: Multidetector CT imaging of the cervical, thoracic and lumbar spine was performed without intravenous contrast. Multiplanar CT image reconstructions were also generated. RADIATION DOSE REDUCTION: This exam was performed according to the departmental dose-optimization program which includes automated exposure control, adjustment of the mA and/or kV according to patient size and/or use of iterative reconstruction technique. COMPARISON:  CT abdomen pelvis 11/17/2017, CT abdomen pelvis 03/14/2020 FINDINGS: CT CERVICAL SPINE FINDINGS Alignment: Normal. Skull base and  vertebrae: Multilevel mild-to-moderate degenerative changes spine most prominent at the C5 through C7 levels. Associated intervertebral disc space narrowing at these levels. No severe osseous central canal or neural foraminal stenosis. No acute fracture. No aggressive appearing focal osseous lesion or focal pathologic process. Soft tissues and spinal canal: No prevertebral fluid or swelling. No visible canal hematoma. Upper chest: Centrilobular emphysematous changes. Other: 1.2 cm hypodense lesion within the right thyroid gland. Not clinically significant; no follow-up imaging recommended (ref: J Am Coll Radiol. 2015 Feb;12(2): 143-50).Atherosclerotic plaque of the carotid arteries within the neck. CT THORACIC SPINE FINDINGS Alignment: Normal. Vertebrae: No severe osseous neural foraminal or central canal stenosis. No acute fracture or focal pathologic process. Paraspinal and other soft tissues: Negative. Disc levels: Developing intervertebral disc space vacuum phenomenon at a couple levels. Otherwise maintained. CT LUMBAR SPINE FINDINGS Segmentation: 5 lumbar type vertebrae. Alignment: Normal. Vertebrae: Mild multilevel degenerative changes spine. Diffusely decreased bone density. No severe osseous central canal or neural foraminal stenosis. No acute fracture or focal pathologic process. Paraspinal and other soft tissues: Negative. Disc levels: Intervertebral disc space vacuum phenomenon at the L5-S1 level. 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 Redemonstration of several focal areas of distension of the abdominal aorta and with no aneurysm. Severe atherosclerotic plaque. Four-vessel coronary calcification. Partially visualized cardiac leads. IMPRESSION: 1. No acute displaced  fracture or traumatic listhesis of the cervical, thoracic, lumbar spine. 2. No severe osseous neural foraminal or central canal stenosis of the cervical, thoracic, lumbar spine. 3. 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 4. 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 5. Aortic Atherosclerosis (ICD10-I70.0) - severe including four-vessel coronary calcification. 6.  Emphysema (ICD10-J43.9). Electronically Signed   By: Iven Finn M.D.   On: 12/09/2021 21:51   CT THORACIC SPINE WO CONTRAST  Result Date: 12/09/2021 CLINICAL DATA:  Myelopathy, acute, cervical spine; Myelopathy, acute, lumbar spine; Myelopathy, acute, thoracic spine EXAM: CT CERVICAL, THORACIC, AND LUMBAR SPINE WITHOUT CONTRAST TECHNIQUE: Multidetector CT imaging of the cervical, thoracic and lumbar spine was performed without intravenous contrast. Multiplanar CT image reconstructions were also generated. RADIATION DOSE REDUCTION: This exam was performed according to the departmental dose-optimization program which includes automated exposure control, adjustment of the mA and/or kV according to patient size and/or use of iterative reconstruction technique. COMPARISON:  CT abdomen pelvis 11/17/2017, CT abdomen pelvis 03/14/2020 FINDINGS: CT CERVICAL SPINE FINDINGS Alignment: Normal. Skull base and vertebrae: Multilevel mild-to-moderate degenerative changes spine most prominent at the C5 through C7 levels. Associated intervertebral disc space narrowing at these levels.  No severe osseous central canal or neural foraminal stenosis. No acute fracture. No aggressive appearing focal osseous lesion or focal pathologic process. Soft tissues and spinal canal: No prevertebral fluid or swelling. No visible canal hematoma. Upper chest: Centrilobular emphysematous changes. Other: 1.2 cm  hypodense lesion within the right thyroid gland. Not clinically significant; no follow-up imaging recommended (ref: J Am Coll Radiol. 2015 Feb;12(2): 143-50).Atherosclerotic plaque of the carotid arteries within the neck. CT THORACIC SPINE FINDINGS Alignment: Normal. Vertebrae: No severe osseous neural foraminal or central canal stenosis. No acute fracture or focal pathologic process. Paraspinal and other soft tissues: Negative. Disc levels: Developing intervertebral disc space vacuum phenomenon at a couple levels. Otherwise maintained. CT LUMBAR SPINE FINDINGS Segmentation: 5 lumbar type vertebrae. Alignment: Normal. Vertebrae: Mild multilevel degenerative changes spine. Diffusely decreased bone density. No severe osseous central canal or neural foraminal stenosis. No acute fracture or focal pathologic process. Paraspinal and other soft tissues: Negative. Disc levels: Intervertebral disc space vacuum phenomenon at the L5-S1 level. 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 Redemonstration of several focal areas of distension of the abdominal aorta and with no aneurysm. Severe atherosclerotic plaque. Four-vessel coronary calcification. Partially visualized cardiac leads. IMPRESSION: 1. No acute displaced fracture or traumatic listhesis of the cervical, thoracic, lumbar spine. 2. No severe osseous neural foraminal or central canal stenosis of the cervical, thoracic, lumbar spine. 3. 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 4. 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 5. Aortic Atherosclerosis (ICD10-I70.0) - severe  including four-vessel coronary calcification. 6.  Emphysema (ICD10-J43.9). Electronically Signed   By: Iven Finn M.D.   On: 12/09/2021 21:51   CT LUMBAR SPINE WO CONTRAST  Result Date: 12/09/2021 CLINICAL DATA:  Myelopathy, acute, cervical spine; Myelopathy, acute, lumbar spine; Myelopathy, acute, thoracic spine EXAM: CT CERVICAL, THORACIC, AND LUMBAR SPINE WITHOUT CONTRAST TECHNIQUE: Multidetector CT imaging of the cervical, thoracic and lumbar spine was performed without intravenous contrast. Multiplanar CT image reconstructions were also generated. RADIATION DOSE REDUCTION: This exam was performed according to the departmental dose-optimization program which includes automated exposure control, adjustment of the mA and/or kV according to patient size and/or use of iterative reconstruction technique. COMPARISON:  CT abdomen pelvis 11/17/2017, CT abdomen pelvis 03/14/2020 FINDINGS: CT CERVICAL SPINE FINDINGS Alignment: Normal. Skull base and vertebrae: Multilevel mild-to-moderate degenerative changes spine most prominent at the C5 through C7 levels. Associated intervertebral disc space narrowing at these levels. No severe osseous central canal or neural foraminal stenosis. No acute fracture. No aggressive appearing focal osseous lesion or focal pathologic process. Soft tissues and spinal canal: No prevertebral fluid or swelling. No visible canal hematoma. Upper chest: Centrilobular emphysematous changes. Other: 1.2 cm hypodense lesion within the right thyroid gland. Not clinically significant; no follow-up imaging recommended (ref: J Am Coll Radiol. 2015 Feb;12(2): 143-50).Atherosclerotic plaque of the carotid arteries within the neck. CT THORACIC SPINE FINDINGS Alignment: Normal. Vertebrae: No severe osseous neural foraminal or central canal stenosis. No acute fracture or focal pathologic process. Paraspinal and other soft tissues: Negative. Disc levels: Developing intervertebral disc space vacuum  phenomenon at a couple levels. Otherwise maintained. CT LUMBAR  SPINE FINDINGS Segmentation: 5 lumbar type vertebrae. Alignment: Normal. Vertebrae: Mild multilevel degenerative changes spine. Diffusely decreased bone density. No severe osseous central canal or neural foraminal stenosis. No acute fracture or focal pathologic process. Paraspinal and other soft tissues: Negative. Disc levels: Intervertebral disc space vacuum phenomenon at the L5-S1 level. 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 Redemonstration of several focal areas of distension of the abdominal aorta and with no aneurysm. Severe atherosclerotic plaque. Four-vessel coronary calcification. Partially visualized cardiac leads. IMPRESSION: 1. No acute displaced fracture or traumatic listhesis of the cervical, thoracic, lumbar spine. 2. No severe osseous neural foraminal or central canal stenosis of the cervical, thoracic, lumbar spine. 3. 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 4. 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 5. Aortic Atherosclerosis (ICD10-I70.0) - severe including four-vessel coronary calcification. 6.  Emphysema (ICD10-J43.9). Electronically Signed   By: Iven Finn M.D.   On: 12/09/2021 21:51   CT HEAD WO CONTRAST (5MM)  Result Date: 12/09/2021 CLINICAL DATA:  Hypertension, diabetes mellitus, atrial fibrillation, generalized weakness EXAM: CT HEAD WITHOUT CONTRAST TECHNIQUE: Contiguous axial images were obtained from the base of the skull through the vertex without intravenous contrast. RADIATION DOSE REDUCTION: This exam was  performed according to the departmental dose-optimization program which includes automated exposure control, adjustment of the mA and/or kV according to patient size and/or use of iterative reconstruction technique. COMPARISON:  01/06/2021 FINDINGS: Brain: Generalized atrophy. Normal ventricular morphology. No midline shift or mass effect. Small vessel chronic ischemic changes of deep cerebral white matter. BILATERAL old basal ganglia lacunar infarcts. Small old infarct RIGHT cerebellum. No intracranial hemorrhage, mass lesion, evidence of acute infarction, or extra-axial fluid collection. Vascular: Atherosclerotic calcifications of internal carotid arteries at skull base. No hyperdense vessels. Skull: Intact Sinuses/Orbits: Partial opacification of BILATERAL ethmoid air cells. Remaining paranasal sinuses and mastoid air cells clear Other: Significant ossification within falx. IMPRESSION: Atrophy with small vessel chronic ischemic changes of deep cerebral white matter. 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 No acute intracranial abnormalities. Electronically Signed   By: Lavonia Dana M.D.   On: 12/09/2021 18:31   DG Chest Portable 1 View  Result Date: 12/09/2021 CLINICAL DATA:  Short of breath, hypertension, generalized weakness EXAM: PORTABLE CHEST 1 VIEW COMPARISON:  12/28/2020 FINDINGS: Single frontal view of the chest demonstrates a stable cardiac silhouette. Dual lead pacemaker again noted. Stable atherosclerosis of the thoracic aorta. No acute airspace disease, effusion, or pneumothorax. No acute bony abnormality. IMPRESSION: 1. Stable chest, no acute process. Electronically Signed   By: Randa Ngo M.D.   On: 12/09/2021 18:14        Scheduled Meds:  amLODipine  2.5 mg Oral Daily   aspirin EC  81 mg Oral Daily   clopidogrel  75 mg Oral Daily   docusate sodium  100 mg Oral BID   enoxaparin (LOVENOX) injection  30 mg Subcutaneous Q24H   fluticasone furoate-vilanterol  1  puff Inhalation Daily   And   umeclidinium bromide  1 puff Inhalation Daily   folic acid  1 mg Oral Daily   metoprolol succinate  50 mg Oral Daily   mirabegron ER  25 mg Oral Daily   mirtazapine  7.5 mg Oral QHS   multivitamin with minerals  1 tablet Oral Daily   predniSONE  65 mg Oral Q breakfast   sodium chloride flush  3 mL Intravenous Q12H   thiamine  100 mg Oral Daily   Continuous Infusions:  lactated ringers 75 mL/hr at 12/09/21 2316     LOS: 0 days   Time spent= 35 mins    Tyyonna Soucy Arsenio Loader, MD [facility]  If 7PM-7AM, please contact night-coverage  12/10/2021, 8:26 AM

## 2021-12-10 NOTE — Evaluation (Signed)
Occupational Therapy Evaluation Patient Details Name: Shannon Chen MRN: [id number] DOB: 1945-12-17 Today's Date: 12/10/2021   History of Present Illness Patient is a 76 year old female with medical history significant of CAD, HOCM, AV block s/p PPM, hypertension, hyperlipidemia, prior CVA who presents to the ED with complaints of bilateral lower extremity weakness. MRI contraindicated due to PPM. CT head with no acute intracranial abnormality.   Clinical Impression   Patient seen for OT evaluation. Pt presenting with decreased independence in self care, balance, functional mobility/transfers, and endurance. Pt coming from group home. Pt reports she is normally independent with ADLs, however, recently requiring assistance. She receives assistance for IADLs from staff at group home at baseline. Pt currently functioning at Mod A for simulated toilet transfer using RW and set up-supervision for seated grooming/dressing tasks. Pt will benefit from acute OT to increase overall independence in the areas of ADLs, functional mobility in order to safely discharge to next venue of care. Upon hospital discharge, recommend STR to maximize pt safety and return to PLOF.        Recommendations for follow up therapy are one component of a multi-disciplinary discharge planning process, led by the attending physician.  Recommendations may be updated based on patient status, additional functional criteria and insurance authorization.   Follow Up Recommendations  Skilled nursing-short term rehab (<3 hours/day)    Assistance Recommended at Discharge Frequent or constant Supervision/Assistance  Patient can return home with the following Two people to help with walking and/or transfers;A lot of help with bathing/dressing/bathroom;Direct supervision/assist for medications management;Assist for transportation;Help with stairs or ramp for entrance;Assistance with cooking/housework    Functional Status Assessment   Patient has had a recent decline in their functional status and demonstrates the ability to make significant improvements in function in a reasonable and predictable amount of time.  Equipment Recommendations  Other (comment) (defer to next venue of care)    Recommendations for Other Services       Precautions / Restrictions Precautions Precautions: Fall Restrictions Weight Bearing Restrictions: No      Mobility Bed Mobility               General bed mobility comments: NT, received/left in recliner    Transfers Overall transfer level: Needs assistance Equipment used: Rolling walker (2 wheels) Transfers: Sit to/from Stand Sit to Stand: Mod assist           General transfer comment: 2x STS from recliner, VC for hand placement      Balance Overall balance assessment: Needs assistance Sitting-balance support: Feet supported Sitting balance-Leahy Scale: Fair     Standing balance support: Bilateral upper extremity supported, Reliant on assistive device for balance Standing balance-Leahy Scale: Poor Standing balance comment: patient relying heavily on RW for UE support. external support required to maintain standing balance, limited standing tolerance and unable to take steps this date                           ADL either performed or assessed with clinical judgement   ADL Overall ADL's : Needs assistance/impaired Eating/Feeding: Set up;Sitting   Grooming: Wash/dry face;Oral care;Set up;Supervision/safety;Sitting   Upper Body Dressing : Sitting;Set up;Supervision/safety Upper Body Dressing Details (indicate cue type and reason): to don/doff gown  Lower Body Dressing: Sitting/lateral leans;Supervision/safety;Set up Lower Body Dressing Details (indicate cue type and reason): able to bend down and adjust socks in sitting; anticipate assistance for threading over feet and pulling up  over hips in standing  Toilet Transfer: Rolling walker (2  wheels);Moderate assistance Toilet Transfer Details (indicate cue type and reason): simulated with STS from recliner  Toileting- Clothing Manipulation and Hygiene: Min guard;Sit to/from stand Toileting - Clothing Manipulation Details (indicate cue type and reason): for simulated clothing management in standing; anticipate assistance for posterior hygiene             Vision Patient Visual Report: No change from baseline       Perception     Praxis      Pertinent Vitals/Pain Pain Assessment Pain Assessment: No/denies pain     Hand Dominance Right   Extremity/Trunk Assessment Upper Extremity Assessment Upper Extremity Assessment: Generalized weakness (proximal muscle weakness noted shoulder flexion (4/5). decreased grip strength in L compared to right. LT sensation intact)   Lower Extremity Assessment Lower Extremity Assessment: Generalized weakness       Communication Communication Communication: No difficulties   Cognition Arousal/Alertness: Awake/alert Behavior During Therapy: WFL for tasks assessed/performed Overall Cognitive Status: Within Functional Limits for tasks assessed                                 General Comments: patient able to follow single step commands without diffculty     General Comments       Exercises Other Exercises Other Exercises: OT provided education re: role of OT, OT POC, post acute recs, sitting up for all meals, EOB/OOB mobility with assistance, home/fall safety.     Shoulder Instructions      Home Living Family/patient expects to be discharged to:: Group home                                        Prior Functioning/Environment Prior Level of Function : Needs assist             Mobility Comments: patient has rolling walker and cane but rarely uses them. typically independent with mobility ADLs Comments: she reports needing assistance with ADLs recently due to weakness, otherwise can  perform ADL independently. she reports medication and meal assistance at the group home        OT Problem List: Decreased strength;Decreased activity tolerance;Impaired balance (sitting and/or standing);Decreased range of motion      OT Treatment/Interventions: Self-care/ADL training;Therapeutic exercise;Patient/family education;Balance training;Energy conservation;Therapeutic activities;DME and/or AE instruction    OT Goals(Current goals can be found in the care plan section) Acute Rehab OT Goals Patient Stated Goal: return to PLOF OT Goal Formulation: With patient Time For Goal Achievement: 12/24/21 Potential to Achieve Goals: Fair ADL Goals Pt Will Perform Grooming: with supervision;sitting Pt Will Perform Lower Body Dressing: with min guard assist;sit to/from stand Pt Will Transfer to Toilet: with min assist;bedside commode;stand pivot transfer Pt Will Perform Toileting - Clothing Manipulation and hygiene: sit to/from stand;with supervision  OT Frequency: Min 2X/week    Co-evaluation              AM-PAC OT "6 Clicks" Daily Activity     Outcome Measure Help from another person eating meals?: A Little Help from another person taking care of personal grooming?: A Little Help from another person toileting, which includes using toliet, bedpan, or urinal?: A Lot Help from another person bathing (including washing, rinsing, drying)?: A Lot Help from another person to put on and taking off regular upper  body clothing?: A Little Help from another person to put on and taking off regular lower body clothing?: A Lot 6 Click Score: 15   End of Session Equipment Utilized During Treatment: Gait belt;Rolling walker (2 wheels) Nurse Communication: Mobility status  Activity Tolerance: Patient tolerated treatment well;Patient limited by fatigue Patient left: in chair;with call bell/phone within reach;with chair alarm set  OT Visit Diagnosis: Unsteadiness on feet (R26.81);Muscle weakness  (generalized) (M62.81)                Time: 7867-6720 OT Time Calculation (min): 26 min Charges:  OT General Charges $OT Visit: 1 Visit OT Evaluation $OT Eval Low Complexity: 1 Low  [facility] MS, OTR/L ascom [phone]  12/10/21, 1:33 PM
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
## 2021-12-10 NOTE — Consult Note (Signed)
Neurology Consultation Reason for Consult: Bilateral leg weakness Requesting Physician: Gerlean Ren  CC: Transient leg numbness and persistent leg weakness  History is obtained from: Patient and chart review  HPI: Shannon Chen is a 76 y.o. female with a past medical history significant for coronary artery disease, heart block s/p pacer, hypertension, hyperlipidemia, right carotid endarterectomy, hypertrophic cardiomyopathy  She reports that she was showering 5 to 6 days ago when she noticed that both of her legs were numb she also felt like she was numb when she was wiping herself after toileting.  However she noted that the numbness had resolved yesterday but she was having difficulty with ambulation for which she presented to the ED.  Otherwise on review of systems she notes some unexplained bruising of her arms for the last 2 or 3 days but no other symptoms  Due to concern for myoglobinuria on UA, I requested CK which was found to be elevated by the admitting hospitalist who subsequently started prednisone initially at 1 mg/kg daily.  Statin has been held  ROS: All other review of systems was negative except as noted in the HPI.   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 09/2016 s/p MDT [id number] Azure XT DR MRI DC PPM   CAD (coronary artery disease)    a. 11/2020 NSTEMI/Cath: LM mild diff dzs, LAD, diff dzs, LCX 30d, RCA Ca2+ w/ diff dzs throughout, 3m FFR cath not able to advance-->Med Rx.   Carotid artery disease ([place]    a. 09/2016 Carotid U/S: RICA 774-08% LICA 514-48%  b. 81/8563CTA Neck: RICA 60, RCCA 30, LICA 714% L Vert 60, L Basilar 70; c. 10/2019 s/p R CEA.   Essential hypertension    History of stress test    a. 12/2016 MV: EF 51%, no ischemia/infarct; b. 10/2019 MV: EF 61%, no ischemia/infarct.   Hyperlipidemia    Hypertrophic cardiomyopathy ([place]    a. 09/2016 Echo: EF 60-65%, no rwma, Gr1 DD, Ca2+ MV annulus; b. 11/2017 Echo: EF  70-75%, no rwma, Gr1 DD, mild AS vs dynamic LVOT obs. Nl RV fxn. Hyperdynamic LVEF w/ sev LVH; c. 10/2019 Echo: EF 70-75%, no rwma, gr1 DD, sev LVH, Gr1 DD, nl PASP, triv MR, mild-mod Ao sclerosis; c. 05/2020 Echo: EF 60-65%; e. 11/2020 Echo: EF 60-65%, sev LVH, nl RV fxn, Mild MR. Ao Sclerosis.   Presence of permanent cardiac pacemaker    Past Surgical History:  Procedure Laterality Date   ABDOMINAL HYSTERECTOMY     CORONARY STENT INTERVENTION N/A 12/31/2020   Procedure: CORONARY STENT INTERVENTION;  Surgeon: AWellington Hampshire MD;  [facility];  Service: Cardiovascular;  Laterality: N/A;   ENDARTERECTOMY Right 11/16/2019   Procedure: ENDARTERECTOMY CAROTID;  Surgeon: SKatha Cabal MD;  Location: [facility];  Service: Vascular;  Laterality: Right;   INTRAVASCULAR PRESSURE WIRE/FFR STUDY N/A 12/13/2020   Procedure: INTRAVASCULAR PRESSURE WIRE/FFR STUDY;  Surgeon: HLeonie Man MD;  [facility];  Service: Cardiovascular;  Laterality: N/A;   LEFT HEART CATH AND CORONARY ANGIOGRAPHY N/A 12/13/2020   Procedure: LEFT HEART CATH AND CORONARY ANGIOGRAPHY;  Surgeon: HLeonie Man MD;  [facility];  Service: Cardiovascular;  Laterality: N/A;   LEFT HEART CATH AND CORONARY ANGIOGRAPHY N/A 12/31/2020   Procedure: LEFT HEART CATH AND CORONARY ANGIOGRAPHY;  Surgeon: AWellington Hampshire MD;  [facility];  Service:  Cardiovascular;  Laterality: N/A;   PACEMAKER IMPLANT N/A 10/08/2016   Procedure: Pacemaker Implant;  Surgeon: Evans Lance, MD;  Location: [place] [facility];  Service: Cardiovascular;  Laterality: N/A;   Current Outpatient Medications  Medication Instructions   acetaminophen (TYLENOL) 650 mg, Oral, Every 4 hours PRN   albuterol (VENTOLIN HFA) 108 (90 Base) MCG/ACT inhaler 1-2 puffs, Inhalation, Every 6 hours PRN   amLODipine (NORVASC) 2.5 mg, Oral, Daily   aspirin EC 81 mg, Oral, Daily, Swallow whole.    butalbital-acetaminophen-caffeine (FIORICET) 50-325-40 MG tablet 1 tablet, Oral, Every 4 hours PRN   clopidogrel (PLAVIX) 75 mg, Oral, Daily   dapagliflozin propanediol (FARXIGA) 10 mg, Oral, Daily   docusate sodium (COLACE) 100 mg, Oral, 2 times daily   Gemtesa 75 mg, Oral, Daily   Kerendia 20 mg, Oral, Daily   loperamide (IMODIUM) 2 mg, Oral, As needed   meclizine (ANTIVERT) 0.5 mg, Oral, As needed   metoprolol succinate (TOPROL-XL) 50 mg, Oral, Daily   mirtazapine (REMERON) 15 MG tablet Take by mouth.   mirtazapine (REMERON) 7.5 mg, Oral, Daily at bedtime   polyethylene glycol (MIRALAX) 17 g, Oral, Daily PRN   potassium chloride (KLOR-CON) 10 MEQ tablet 10 mEq, Oral, Daily   prochlorperazine (COMPAZINE) 10 mg, Oral, Every 6 hours PRN   rosuvastatin (CRESTOR) 40 MG tablet TAKE 1 TABLET BY MOUTH DAILY   TRELEGY ELLIPTA 200-62.5-25 MCG/ACT AEPB 1 puff, Inhalation, Daily      Family History  Problem Relation Age of Onset   Diabetes Mother    CAD Father     Social History:  reports that she quit smoking about 3 years ago. Her smoking use included cigarettes. She has a 30.00 pack-year smoking history. She has never used smokeless tobacco. She reports that she does not drink alcohol and does not use drugs.   Exam: Current vital signs: BP (!) 151/63 (BP Location: Right Arm)   Pulse 71   Temp 97.7 F (36.5 C) (Oral)   Resp 18   Ht 5' 5.5" (1.664 m)   Wt 64.9 kg   SpO2 99%   BMI 23.43 kg/m  Vital signs in last 24 hours: Temp:  [97.4 F (36.3 C)-98 F (36.7 C)] 97.7 F (36.5 C) (10/24 0806) Pulse Rate:  [60-74] 71 (10/24 0806) Resp:  [12-23] 18 (10/24 0806) BP: (100-164)/(46-82) 151/63 (10/24 0806) SpO2:  [96 %-100 %] 99 % (10/24 0806)   Physical Exam  Constitutional: Appears somewhat chronically ill Psych: Affect appropriate to situation, calm and cooperative Eyes: No scleral injection HENT: No oropharyngeal obstruction.  MSK: no joint deformities.  No tenderness to  palpation of the total spine.   Cardiovascular: Perfusing extremities well Respiratory: Effort normal, non-labored breathing GI: Soft.  No distension. There is no tenderness.  GU: Patient declined reporting that the ED had performed this examination and that she had normal sensation for cold jelly and could squeeze well Skin: Likely lipoma of the back, patient reports chronic and stable.  Scattered bruises of the bilateral upper extremities, but no palpable purpura.  Mild blanchable erythema of the bilateral knees.  Neuro: Mental Status: Patient is awake, alert, oriented to person, place, month, year, and situation. Patient is able to give a clear and coherent history, at times slightly slow to respond. No signs of aphasia or neglect Cranial Nerves: II: Visual Fields are full. Pupils are equal, round, and reactive to light.   III,IV, VI: EOMI without ptosis or diploplia.  V: Facial sensation is  symmetric to temperature and light touch VII: Facial movement is symmetric.  VIII: hearing is intact to voice X: Uvula elevates symmetrically XI: Shoulder shrug is symmetric, 5/5 as is head turn bilaterally and neck flexion/extension XII: tongue is deviated towards the right particularly in the medial portion Motor: Tone is normal. Bulk is normal.  4/5 deltoids, otherwise 5/5 in the bilateral upper extremities.  Lower extremities 3/5 hip flexion, 4/5 knee flexion and extension plantar and dorsal foot, Sensory: Sensation is symmetric to light touch and temperature in the arms and legs.  Slightly reduced vibration (5 to 7 seconds) at the bilateral great toes Reflexes: 3+ and symmetric brachioradialis, biceps with positive Hoffmann's bilaterally.  Right patellar is 2+, left patellar I cannot elicit as she jumps with pain with attempts.  She does not relax ankles to test clonus sufficiently.  Toes are downgoing bilaterally Cerebellar: FNF and toe to hand intact bilaterally, unable to perform  heel-to-shin due to weakness  Gait:  Deferred due to weakness  I have reviewed labs in epic and the results pertinent to this consultation are:  Basic Metabolic Panel: Recent Labs  Lab 12/09/21 1049 12/10/21 0348  NA 142 141  K 4.8 4.3  CL 115* 116*  CO2 20* 19*  GLUCOSE 100* 83  BUN 40* 34*  CREATININE 2.08* 1.61*  CALCIUM 9.0 8.3*  Baseline creatinine 1.2-1.6  CBC: Recent Labs  Lab 12/09/21 1049 12/10/21 0348  WBC 5.3 4.8  NEUTROABS  --  2.7  HGB 12.1 11.1*  HCT 38.8 35.7*  MCV 85.5 84.6  PLT 188 176    Coagulation Studies: No results for input(s): "LABPROT", "INR" in the last 72 hours.    ESR minimally elevated at 37 CRP pending  BK elevated at 6906, now downtrending to 6079 TSH normal at 1.225 A1c 6.4% RPR negative B12 low normal at 339 with MMA pending   I have reviewed the images obtained: CT total spine 1. No acute displaced fracture or traumatic listhesis of the cervical, thoracic, lumbar spine. 2. No severe osseous neural foraminal or central canal stenosis of the cervical, thoracic, lumbar spine. 3. 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 4. 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 5. Aortic Atherosclerosis (ICD10-I70.0) - severe including four-vessel coronary calcification. 6.  Emphysema (ICD10-J43.9).   Impression: History and examination more consistent with a myopathy than a myositis given lack of pain.  I am not sure what to make of her reported bilateral leg numbness and genitoanal numbness that has resolved.  She is also slightly hyperreflexic in her upper extremities but this may be secondary to chronic issues and I will continue to monitor for now  Recommendations: -Agree with discontinuing statin -Additional labs ordered serum myoglobin, aldolase, vitamin D,  myositis panel, anti-HMG CoA reductase antibody, -Agree with 1 mg/kg dosing of prednisone for now, would not escalate at this time given her comorbidities and relative clinical stability -No inpatient availability of EMG, may consider on an outpatient basis -May consider muscle biopsy pending clinical course -Given low normal B12, will start 1000 mcg p.o. daily pending MMA results -PT/OT  Lesleigh Noe MD-PhD [facility] [phone] [facility] coverage for [facility] is from 8 AM to 4 AM in-house and 4 PM to 8 PM by telephone/video. 8 PM to 8 AM emergent questions or overnight urgent questions should be  addressed to Teleneurology On-call or [facility] neurohospitalist; contact information can be found on AMION
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
## 2021-12-10 NOTE — Progress Notes (Signed)
Mobility Specialist - Progress Note   12/10/21 1100  Mobility  Activity Transferred to/from [facility]  Level of Assistance Standby assist, set-up cues, supervision of patient - no hands on  Assistive Device Front wheel walker  Distance Ambulated (ft) 2 ft  Activity Response Tolerated well  $Mobility charge 1 Mobility     Pt sitting in recliner upon arrival. Transferred chair-BSC with minG. Noted soiled linen upon standing. Clean linen provided. Assist for peri-care d/t need for BUE support. Pt returned to chair with alarm set, needs in reach.    Kathee Delton Mobility Specialist 12/10/21, 11:54 AM

## 2021-12-11 DIAGNOSIS — R531 Weakness: Secondary | ICD-10-CM | POA: Diagnosis not present

## 2021-12-11 LAB — CBC
HCT: 34 % — ABNORMAL LOW (ref 36.0–46.0)
Hemoglobin: 11 g/dL — ABNORMAL LOW (ref 12.0–15.0)
MCH: 26.6 pg (ref 26.0–34.0)
MCHC: 32.4 g/dL (ref 30.0–36.0)
MCV: 82.3 fL (ref 80.0–100.0)
Platelets: 184 10*3/uL (ref 150–400)
RBC: 4.13 MIL/uL (ref 3.87–5.11)
RDW: 15.7 % — ABNORMAL HIGH (ref 11.5–15.5)
WBC: 10.3 10*3/uL (ref 4.0–10.5)
nRBC: 0 % (ref 0.0–0.2)

## 2021-12-11 LAB — COMPREHENSIVE METABOLIC PANEL
ALT: 101 U/L — ABNORMAL HIGH (ref 0–44)
AST: 80 U/L — ABNORMAL HIGH (ref 15–41)
Albumin: 3.1 g/dL — ABNORMAL LOW (ref 3.5–5.0)
Alkaline Phosphatase: 50 U/L (ref 38–126)
Anion gap: 7 (ref 5–15)
BUN: 34 mg/dL — ABNORMAL HIGH (ref 8–23)
CO2: 20 mmol/L — ABNORMAL LOW (ref 22–32)
Calcium: 9.2 mg/dL (ref 8.9–10.3)
Chloride: 112 mmol/L — ABNORMAL HIGH (ref 98–111)
Creatinine, Ser: 1.35 mg/dL — ABNORMAL HIGH (ref 0.44–1.00)
GFR, Estimated: 40 mL/min — ABNORMAL LOW (ref >60)
Glucose, Bld: 95 mg/dL (ref 70–99)
Potassium: 3.9 mmol/L (ref 3.5–5.1)
Sodium: 139 mmol/L (ref 135–145)
Total Bilirubin: 0.8 mg/dL (ref 0.3–1.2)
Total Protein: 6.4 g/dL — ABNORMAL LOW (ref 6.5–8.1)

## 2021-12-11 LAB — ALDOLASE: Aldolase: 26.7 U/L — ABNORMAL HIGH (ref 3.3–10.3)

## 2021-12-11 LAB — GAMMA GT: GGT: 24 U/L (ref 7–50)

## 2021-12-11 LAB — VITAMIN D 25 HYDROXY (VIT D DEFICIENCY, FRACTURES): Vit D, 25-Hydroxy: 15.65 ng/mL — ABNORMAL LOW (ref 30–100)

## 2021-12-11 LAB — CK: Total CK: 4647 U/L — ABNORMAL HIGH (ref 38–234)

## 2021-12-11 LAB — ANTI-JO 1 ANTIBODY, IGG: Anti JO-1: <0.2 AI (ref 0.0–0.9)

## 2021-12-11 LAB — GLUCOSE, CAPILLARY
Glucose-Capillary: 185 mg/dL — ABNORMAL HIGH (ref 70–99)
Glucose-Capillary: 215 mg/dL — ABNORMAL HIGH (ref 70–99)

## 2021-12-11 LAB — MAGNESIUM: Magnesium: 1.8 mg/dL (ref 1.7–2.4)

## 2021-12-11 LAB — ANA W/REFLEX: Anti Nuclear Antibody (ANA): NEGATIVE

## 2021-12-11 MED ORDER — ENOXAPARIN SODIUM 40 MG/0.4ML IJ SOSY
40.0000 mg | PREFILLED_SYRINGE | INTRAMUSCULAR | Status: DC
  Administered 2021-12-11 – 2021-12-13 (×3): 40 mg via SUBCUTANEOUS
  Filled 2021-12-11 (×3): qty 0.4

## 2021-12-11 MED ORDER — INSULIN ASPART 100 UNIT/ML IJ SOLN
0.0000 [IU] | Freq: Three times a day (TID) | INTRAMUSCULAR | Status: DC
  Administered 2021-12-11 – 2021-12-14 (×2): 1 [IU] via SUBCUTANEOUS
  Administered 2021-12-15: 2 [IU] via SUBCUTANEOUS
  Administered 2021-12-16: 4 [IU] via SUBCUTANEOUS
  Administered 2021-12-17: 2 [IU] via SUBCUTANEOUS
  Administered 2021-12-17: 1 [IU] via SUBCUTANEOUS
  Administered 2021-12-19 (×2): 2 [IU] via SUBCUTANEOUS
  Filled 2021-12-11 (×10): qty 1

## 2021-12-11 MED ORDER — VITAMIN D (ERGOCALCIFEROL) 1.25 MG (50000 UNIT) PO CAPS
50000.0000 [IU] | ORAL_CAPSULE | ORAL | Status: DC
  Administered 2021-12-11: 50000 [IU] via ORAL
  Filled 2021-12-11: qty 1

## 2021-12-11 NOTE — TOC Progression Note (Signed)
Transition of Care [facility]) - Progression Note    Patient Details  Name: Shannon Chen MRN: [id number] Date of Birth: 1945-03-24  Transition of Care [facility]) CM/SW Contact  Beverly Sessions, RN Phone Number: 12/11/2021, 9:54 AM  Clinical Narrative:    Bed offers presented to patient.  Patient accepts bed at [facility] in [place] and notified Sharyn Lull at [facility]  MD to let me know when it is anticipated patient will be ready for discharge in order for me to start auth         Expected Discharge Plan and Services                                                 Social Determinants of Health (SDOH) Interventions    Readmission Risk Interventions    12/10/2021    2:16 PM 12/15/2020    4:10 PM  Readmission Risk Prevention Plan  Transportation Screening Complete Complete  PCP or Specialist Appt within 5-7 Days  Complete  Home Care Screening  Complete  Medication Review (RN CM)  Complete  HRI or Home Care Consult Complete   Social Work Consult for [place] Planning/Counseling Complete   Palliative Care Screening Not Applicable   Medication Review [employer]) Complete

## 2021-12-11 NOTE — Progress Notes (Signed)
Neurology Progress Note  Patient ID: Shannon Chen is a 76 y.o. with PMHx of  has a past medical history of Arthritis, AV block, Mobitz II, CAD (coronary artery disease), Carotid artery disease ([place]), Essential hypertension, History of stress test, Hyperlipidemia, Hypertrophic cardiomyopathy ([place]), and Presence of permanent cardiac pacemaker.  Subjective: Reports she is feeling slightly stronger today, no acute complaints  Exam: Current vital signs: BP (!) 145/58 (BP Location: Right Arm)   Pulse 72   Temp 98.1 F (36.7 C)   Resp 18   Ht 5' 5.5" (1.664 m)   Wt 64.9 kg   SpO2 98%   BMI 23.43 kg/m  Vital signs in last 24 hours: Temp:  [97.5 F (36.4 C)-98.6 F (37 C)] 98.1 F (36.7 C) (10/25 0757) Pulse Rate:  [65-77] 72 (10/25 0757) Resp:  [18] 18 (10/25 0757) BP: (107-170)/(56-70) 145/58 (10/25 0757) SpO2:  [98 %-100 %] 98 % (10/25 0757)   Current Facility-Administered Medications:    acetaminophen (TYLENOL) tablet 650 mg, 650 mg, Oral, Q6H PRN, 650 mg at 12/11/21 0358 **OR** acetaminophen (TYLENOL) suppository 650 mg, 650 mg, Rectal, Q6H PRN, Jose Persia, MD   amLODipine (NORVASC) tablet 2.5 mg, 2.5 mg, Oral, Daily, Jose Persia, MD, 2.5 mg at 12/11/21 0940   aspirin EC tablet 81 mg, 81 mg, Oral, Daily, Jose Persia, MD, 81 mg at 12/11/21 0940   clopidogrel (PLAVIX) tablet 75 mg, 75 mg, Oral, Daily, Jose Persia, MD, 75 mg at 12/11/21 0940   cyanocobalamin (VITAMIN B12) tablet 1,000 mcg, 1,000 mcg, Oral, Daily, Ebba Goll L, MD, 1,000 mcg at 12/11/21 0940   docusate sodium (COLACE) capsule 100 mg, 100 mg, Oral, BID, Jose Persia, MD, 100 mg at 12/11/21 0938   enoxaparin (LOVENOX) injection 40 mg, 40 mg, Subcutaneous, Q24H, Beers, Brandon D, RPH   fluticasone furoate-vilanterol (BREO ELLIPTA) 200-25 MCG/ACT 1 puff, 1 puff, Inhalation, Daily, 1 puff at 12/11/21 1340 **AND** umeclidinium bromide (INCRUSE ELLIPTA) 62.5 MCG/ACT 1 puff, 1 puff, Inhalation,  Daily, Jose Persia, MD, 1 puff at 17/35/67 0141   folic acid (FOLVITE) tablet 1 mg, 1 mg, Oral, Daily, Jose Persia, MD, 1 mg at 12/11/21 0940   guaiFENesin (ROBITUSSIN) 100 MG/5ML liquid 5 mL, 5 mL, Oral, Q4H PRN, Amin, Ankit Chirag, MD   hydrALAZINE (APRESOLINE) injection 10 mg, 10 mg, Intravenous, Q4H PRN, Amin, Ankit Chirag, MD   ipratropium-albuterol (DUONEB) 0.5-2.5 (3) MG/3ML nebulizer solution 3 mL, 3 mL, Nebulization, Q4H PRN, Amin, Ankit Chirag, MD   lactated ringers infusion, , Intravenous, Continuous, Amin, Ankit Chirag, MD, Last Rate: 75 mL/hr at 12/10/21 1623, Infusion Verify at 12/10/21 1623   meclizine (ANTIVERT) tablet 12.5 mg, 12.5 mg, Oral, BID PRN, Amin, Ankit Chirag, MD   metoprolol succinate (TOPROL-XL) 24 hr tablet 50 mg, 50 mg, Oral, Daily, Jose Persia, MD, 50 mg at 12/11/21 0940   metoprolol tartrate (LOPRESSOR) injection 5 mg, 5 mg, Intravenous, Q4H PRN, Amin, Ankit Chirag, MD   mirabegron ER (MYRBETRIQ) tablet 25 mg, 25 mg, Oral, Daily, Jose Persia, MD, 25 mg at 12/11/21 0301   mirtazapine (REMERON) tablet 7.5 mg, 7.5 mg, Oral, QHS, Jose Persia, MD, 7.5 mg at 12/09/21 2209   multivitamin with minerals tablet 1 tablet, 1 tablet, Oral, Daily, Jose Persia, MD, 1 tablet at 12/11/21 0940   polyethylene glycol (MIRALAX / GLYCOLAX) packet 17 g, 17 g, Oral, Daily PRN, Jose Persia, MD   predniSONE (DELTASONE) tablet 65 mg, 65 mg, Oral, Q breakfast, Jose Persia, MD, 65 mg at  12/11/21 0939   sodium chloride flush (NS) 0.9 % injection 3 mL, 3 mL, Intravenous, Q12H, Jose Persia, MD, 3 mL at 12/11/21 0941   thiamine (VITAMIN B1) tablet 100 mg, 100 mg, Oral, Daily, Jose Persia, MD, 100 mg at 12/11/21 0940   traZODone (DESYREL) tablet 50 mg, 50 mg, Oral, QHS PRN, Reesa Chew, Jeanella Flattery, MD    Gen: In bed, comfortable  Resp: non-labored breathing, no grossly audible wheezing Cardiac: Perfusing extremities well  Abd: soft, nt Extremities: Stable  bruising on her arms  Neuro: MS: Awake, alert, oriented to time, person, place, situation CN: Face symmetric, tracking examiner Motor: stable to slightly improved from yesterday 4/5 deltoids otherwise 5/5 in the UE, 2-3/5 bilateral hip flexion, 5/5 knee extension, 4/5 knee flexion (a slightly weaker on the left than the right), 5/5 foot plantar and dorsiflexion on the right, 5/5 foot plantarflexion on the left with 4/5 foot dorsiflexion on the left Sensory: Remains intact DTR: 3+ and symmetric in the biceps / brachioradialis, 2+ symmetric patellar  Pertinent Labs:    Latest Reference Range & Units 12/10/21 15:40  Aldolase 3.3 - 10.3 U/L 26.7 (H)  (H): Data is abnormally high   Latest Reference Range & Units 12/10/21 15:40  Vitamin D, 25-Hydroxy 30 - 100 ng/mL 15.65 (L)  (L): Data is abnormally low   Component Ref Range & Units 04:07 (12/11/21) 1 d ago (12/10/21) 1 d ago (12/10/21) 2 d ago (12/09/21) 11 mo ago (01/02/21) 1 yr ago (05/18/20)  Total CK 38 - 234 U/L 4,647 High   6,079 High  CM  6,635 High  CM  6,906 High  CM  58 CM  65 CM    Lab Results  Component Value Date   ALT 101 (H) 12/11/2021   AST 80 (H) 12/11/2021   ALKPHOS 50 12/11/2021   BILITOT 0.8 12/11/2021     ESR minimally elevated at 37 CRP 0.5   BK elevated at 6906, now downtrending to 6079 TSH normal at 1.225 A1c 6.4% RPR negative B12 low normal at 339 with MMA pending  Assessment: 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 Vitamin D deficiency can play a role in proximal toxic/metabolic myopathy although often with CK levels being normal and with more severe deficiency than this patient has.  Vitamin D deficiency can also play a role in risk for inflammatory myopathyies  For now continue steroids   Recommendations: -Pending: serum myoglobin, myositis panel, anti-HMG CoA  reductase antibody, MMA -Vitamin D repletion, 50,000 units weekly for 8 weeks, further repletion/monitoring per primary team/PCP -GGT to confirm that elevated AST/ALT are secondary to muscle injury and not hepatic injury -Continue to hold statin -Glucose checks given patient is receiving steroids and has an elevated A1c at 6.4% -Neurology will continue to follow  [place] [phone]

## 2021-12-11 NOTE — Progress Notes (Signed)
Physical Therapy Treatment Patient Details Name: Shannon Chen MRN: [id number] DOB: 06-12-1945 Today's Date: 12/11/2021   History of Present Illness Patient is a 76 year old female with medical history significant of CAD, HOCM, AV block s/p PPM, hypertension, hyperlipidemia, prior CVA who presents to the ED with complaints of bilateral lower extremity weakness and transient numbness in legs.    PT Comments    Pt presents lethargic from not having slept all night due to headache, but agreeable to PT.  Pt declined attempting to ambulate this session, because she had already walked during day with mobility specialist. Session kept to bed mobility and EOB therex. Pt shows improved bed mobility requiring min A to lift feet back onto bed and to push up from side lying to sit. Pt also exhibits improvement with activity tolerance with ability to perform therex bedside including mini-squats, LAQ, and seated marches. She will continue to benefit from skilled PT in house to improve mobility and LE strength to ambulate safely and to be safe enough to function within group home. PT continues recommendation for skilled PT at SNF.    Recommendations for follow up therapy are one component of a multi-disciplinary discharge planning process, led by the attending physician.  Recommendations may be updated based on patient status, additional functional criteria and insurance authorization.  Follow Up Recommendations  Skilled nursing-short term rehab (<3 hours/day) Can patient physically be transported by private vehicle: No   Assistance Recommended at Discharge Frequent or constant Supervision/Assistance  Patient can return home with the following Two people to help with walking and/or transfers;A lot of help with bathing/dressing/bathroom;Direct supervision/assist for medications management;Assist for transportation;Help with stairs or ramp for entrance   Equipment Recommendations  None recommended by PT     Recommendations for Other Services       Precautions / Restrictions Precautions Precautions: Fall Restrictions Weight Bearing Restrictions: No     Mobility  Bed Mobility Overal bed mobility: Needs Assistance Bed Mobility: Supine to Sit, Sit to Supine     Supine to sit: Min assist, HOB elevated Sit to supine: Min assist   General bed mobility comments: min A to help pt push up from side lying to sit and min A for guidance of LE back onto hospital bed    Transfers Overall transfer level: Needs assistance Equipment used: Rolling walker (2 wheels) Transfers: Sit to/from Stand Sit to Stand: Mod assist           General transfer comment: STS with prolonged stand with completion of 10 mini-squats at EOB    Ambulation/Gait                   Stairs             Wheelchair Mobility    Modified Rankin (Stroke Patients Only)       Balance Overall balance assessment: Needs assistance Sitting-balance support: Feet supported Sitting balance-Leahy Scale: Fair Sitting balance - Comments: Posterior lean with long arch quads requiring intermittent min A from PT for patient to sit upright Postural control: Posterior lean Standing balance support: Bilateral upper extremity supported, Reliant on assistive device for balance Standing balance-Leahy Scale: Fair Standing balance comment: Patient relies of heavily on BUE support, but able to maintin balance unsupport while doing mini-squat               High Level Balance Comments: Mini-Squat            Cognition Arousal/Alertness: Awake/alert Behavior During Therapy:  WFL for tasks assessed/performed Overall Cognitive Status: Within Functional Limits for tasks assessed                                          Exercises Other Exercises Other Exercises: Mini-Squat with raised bed as TC 1x 10 with BUE support using RW Other Exercises: Long Arc Quads 2 x 10 (Intermittent min A to prevent  posterior lean) Other Exercises: Seated Marches 2 x 10    General Comments General comments (skin integrity, edema, etc.): Lethargic due to lack of sleep      Pertinent Vitals/Pain Pain Assessment Pain Assessment: No/denies pain    Home Living                          Prior Function            PT Goals (current goals can now be found in the care plan section) Acute Rehab PT Goals Patient Stated Goal: to be able to walk PT Goal Formulation: With patient Time For Goal Achievement: 12/24/21 Potential to Achieve Goals: Fair Progress towards PT goals: Progressing toward goals    Frequency    Min 2X/week      PT Plan Current plan remains appropriate    Co-evaluation              AM-PAC PT "6 Clicks" Mobility   Outcome Measure  Help needed turning from your back to your side while in a flat bed without using bedrails?: A Little Help needed moving from lying on your back to sitting on the side of a flat bed without using bedrails?: A Little Help needed moving to and from a bed to a chair (including a wheelchair)?: A Lot Help needed standing up from a chair using your arms (e.g., wheelchair or bedside chair)?: A Lot Help needed to walk in hospital room?: A Lot Help needed climbing 3-5 steps with a railing? : Total 6 Click Score: 13    End of Session Equipment Utilized During Treatment: Gait belt Activity Tolerance: Patient tolerated treatment well;Patient limited by fatigue Patient left: in bed;with call bell/phone within reach;with bed alarm set Nurse Communication: Mobility status PT Visit Diagnosis: Unsteadiness on feet (R26.81);Muscle weakness (generalized) (M62.81)     Time: 8119-1478 PT Time Calculation (min) (ACUTE ONLY): 25 min  Charges:  $Therapeutic Activity: 23-37 mins                    Bradly Chris PT, DPT  12/11/2021, 4:09 PM

## 2021-12-11 NOTE — Progress Notes (Signed)
  PROGRESS NOTE    Shannon Chen  [id number] DOB: 1945/10/31 DOA: 12/09/2021 PCP: Remi Haggard, FNP  205A/205A-AA  LOS: 1 day   Brief hospital course:   Assessment & Plan: 76 year old with history of CAD, HOCM, AV block status post PPM, HTN, HLD, prior CVA admitted for bilateral lower extremity weakness..  About 5 days ago she started experiencing bilateral lower extremity weakness and difficulty walking.  In the ED she was found to have rhabdomyolysis.  CT of the head, cervical thoracic and lumbar spine did not show any acute pathology.  *Bilateral lower extremity weakness 2/2 myopathy Rhabdomyolysis.   Sudden onset about 5 days ago.  Reported numbness and weakness but no pain.  CT head, cervical, lumbar and thoracic spine are unremarkable.  Unable to get MRI due to pacemaker.  Neurology team consulted.  CK elevated at 6906, folate, B12 low normal, TSH normal.  CRP wnl, ESR only mildly elevated at 37. --Vit D 15 (low), which may contribute to proximal myopathy. --started on steroid 1 mg/kg dosing of prednisone. Plan: --cont prednisone for now -Pending: serum myoglobin, myositis panel, anti-HMG CoA reductase antibody, MMA  -start Vitamin D repletion, 50,000 units weekly for 8 weeks --cont vit B12 supplement -GGT to confirm that elevated AST/ALT are secondary to muscle injury and not hepatic injury  -Continue to hold statin  --cont MIVF for rhabdo     Acute kidney injury superimposed on CKD ([name]) Baseline creatinine 0.9.  Admission creatinine 2.08, slowly improving with IV fluids.   --cont MIVF   Hypertension - cont amlodipine and Toprol - Holding home finerenone due to AKI   Hypertrophic cardiomyopathy ([place]) Patient is euvolemic at this time.  She is chest pain-free with stable EKG and troponins within her baseline range   Type 2 diabetes mellitus with hyperlipidemia [facility]) She is on [place] for heart failure.  Hemoglobin A1c 6.4 --SSI   Chronic obstructive  pulmonary disease ([name]) --cont daily bronchodilators    DVT prophylaxis: Lovenox SQ Code Status: Full code  Family Communication:  Level of care: Med-Surg Dispo:   The patient is from: home Anticipated d/c is to: SNF rehab Anticipated d/c date is: 2-3 days   Subjective and Interval History:  Pt reported leg muscles have more sensation and getting stronger.   Objective: Vitals:   12/10/21 1957 12/11/21 0402 12/11/21 0757 12/11/21 1538  BP: 135/61 (!) 170/70 (!) 145/58 (!) 125/52  Pulse: 65 74 72 72  Resp: $Remo'18 18 18 18  'MDJmg$ Temp: 98.1 F (36.7 C) (!) 97.5 F (36.4 C) 98.1 F (36.7 C) 98.7 F (37.1 C)  TempSrc:  Oral  Oral  SpO2: 100% 98% 98% 98%  Weight:      Height:        Intake/Output Summary (Last 24 hours) at 12/11/2021 1647 Last data filed at 12/11/2021 1426 Gross per 24 hour  Intake 120 ml  Output 900 ml  Net -780 ml   Filed Weights   12/09/21 0952  Weight: 64.9 kg    Examination:   Constitutional: NAD, AAOx3 HEENT: conjunctivae and lids normal, EOMI CV: No cyanosis.   RESP: normal respiratory effort, on RA Neuro: II - XII grossly intact.   Psych: Normal mood and affect.  Appropriate judgement and reason   Data Reviewed: I have personally reviewed labs and imaging studies  Time spent: 35 minutes  Enzo Bi, MD [facility] If 7PM-7AM, please contact night-coverage 12/11/2021, 4:47 PM

## 2021-12-11 NOTE — Progress Notes (Signed)
Mobility Specialist - Progress Note   12/11/21 1200  Mobility  Activity Ambulated with assistance in room;Transferred to/from [facility];Transferred from bed to chair  Level of Assistance Minimal assist, patient does 75% or more  Assistive Device Front wheel walker  Distance Ambulated (ft) 5 ft  Activity Response Tolerated well  $Mobility charge 1 Mobility     Pt lying in bed upon arrival, utilizing RA. ModA to complete bed mobility. MinA STS for ambulation and transfers. Pt does attempt sitting before safe to do so several times during session. Flexed posture. Assist for peri-hygiene. Limited activity tolerance; pt states "I feel like I just walked 10 miles" after completing bed-BSC transfer. Pt left in recliner with alarm set, needs in reach.    Shannon Chen Mobility Specialist 12/11/21, 12:10 PM

## 2021-12-12 ENCOUNTER — Inpatient Hospital Stay: Payer: Medicare HMO

## 2021-12-12 DIAGNOSIS — R531 Weakness: Secondary | ICD-10-CM | POA: Diagnosis not present

## 2021-12-12 LAB — COMPREHENSIVE METABOLIC PANEL
ALT: 94 U/L — ABNORMAL HIGH (ref 0–44)
AST: 60 U/L — ABNORMAL HIGH (ref 15–41)
Albumin: 3 g/dL — ABNORMAL LOW (ref 3.5–5.0)
Alkaline Phosphatase: 54 U/L (ref 38–126)
Anion gap: 6 (ref 5–15)
BUN: 37 mg/dL — ABNORMAL HIGH (ref 8–23)
CO2: 22 mmol/L (ref 22–32)
Calcium: 8.8 mg/dL — ABNORMAL LOW (ref 8.9–10.3)
Chloride: 113 mmol/L — ABNORMAL HIGH (ref 98–111)
Creatinine, Ser: 1.19 mg/dL — ABNORMAL HIGH (ref 0.44–1.00)
GFR, Estimated: 47 mL/min — ABNORMAL LOW (ref >60)
Glucose, Bld: 90 mg/dL (ref 70–99)
Potassium: 3.8 mmol/L (ref 3.5–5.1)
Sodium: 141 mmol/L (ref 135–145)
Total Bilirubin: 0.6 mg/dL (ref 0.3–1.2)
Total Protein: 6.3 g/dL — ABNORMAL LOW (ref 6.5–8.1)

## 2021-12-12 LAB — CBC
HCT: 34.1 % — ABNORMAL LOW (ref 36.0–46.0)
Hemoglobin: 11.1 g/dL — ABNORMAL LOW (ref 12.0–15.0)
MCH: 27.2 pg (ref 26.0–34.0)
MCHC: 32.6 g/dL (ref 30.0–36.0)
MCV: 83.6 fL (ref 80.0–100.0)
Platelets: 182 10*3/uL (ref 150–400)
RBC: 4.08 MIL/uL (ref 3.87–5.11)
RDW: 15.9 % — ABNORMAL HIGH (ref 11.5–15.5)
WBC: 11.4 10*3/uL — ABNORMAL HIGH (ref 4.0–10.5)
nRBC: 0 % (ref 0.0–0.2)

## 2021-12-12 LAB — GLUCOSE, CAPILLARY
Glucose-Capillary: 84 mg/dL (ref 70–99)
Glucose-Capillary: 107 mg/dL — ABNORMAL HIGH (ref 70–99)
Glucose-Capillary: 159 mg/dL — ABNORMAL HIGH (ref 70–99)
Glucose-Capillary: 209 mg/dL — ABNORMAL HIGH (ref 70–99)

## 2021-12-12 LAB — MYOGLOBIN, SERUM: Myoglobin: 4189 ng/mL — ABNORMAL HIGH (ref 25–58)

## 2021-12-12 LAB — MAGNESIUM: Magnesium: 1.9 mg/dL (ref 1.7–2.4)

## 2021-12-12 LAB — METHYLMALONIC ACID, SERUM: Methylmalonic Acid, Quantitative: 514 nmol/L — ABNORMAL HIGH (ref 0–378)

## 2021-12-12 MED ORDER — PANTOPRAZOLE SODIUM 40 MG PO TBEC
40.0000 mg | DELAYED_RELEASE_TABLET | Freq: Every day | ORAL | Status: DC
  Administered 2021-12-13 – 2021-12-17 (×5): 40 mg via ORAL
  Filled 2021-12-12 (×6): qty 1

## 2021-12-12 MED ORDER — IOHEXOL 300 MG/ML  SOLN
100.0000 mL | Freq: Once | INTRAMUSCULAR | Status: AC | PRN
  Administered 2021-12-12: 100 mL via INTRAVENOUS

## 2021-12-12 MED ORDER — ALUM & MAG HYDROXIDE-SIMETH 200-200-20 MG/5ML PO SUSP
15.0000 mL | Freq: Four times a day (QID) | ORAL | Status: DC | PRN
  Administered 2021-12-12: 15 mL via ORAL
  Filled 2021-12-12: qty 30

## 2021-12-12 MED ORDER — CALCIUM CARBONATE ANTACID 500 MG PO CHEW
1.0000 | CHEWABLE_TABLET | Freq: Three times a day (TID) | ORAL | Status: DC | PRN
  Administered 2021-12-12: 200 mg via ORAL
  Filled 2021-12-12: qty 1

## 2021-12-12 NOTE — Progress Notes (Signed)
Physical Therapy Treatment Patient Details Name: Shannon Chen MRN: [id number] DOB: June 16, 1945 Today's Date: 12/12/2021   History of Present Illness Patient is a 76 year old female with medical history significant of CAD, HOCM, AV block s/p PPM, hypertension, hyperlipidemia, prior CVA who presents to the ED with complaints of bilateral lower extremity weakness and transient numbness in legs.    PT Comments    Patient patient is cooperative with session. She continues to have proximal LE muscle weakness. Increased activity tolerance this session. The patient has progressed to ambulation and walked 2 bouts of 30 ft with rolling walker with steadying assistance and cues for proper use of rolling walker. Chair following for safety with one seated rest breaks between bouts of walking. Recommend to continue PT to maximize independence and facilitate return to prior level of function.    Recommendations for follow up therapy are one component of a multi-disciplinary discharge planning process, led by the attending physician.  Recommendations may be updated based on patient status, additional functional criteria and insurance authorization.  Follow Up Recommendations  Skilled nursing-short term rehab (<3 hours/day) Can patient physically be transported by private vehicle: No   Assistance Recommended at Discharge Frequent or constant Supervision/Assistance  Patient can return home with the following Two people to help with walking and/or transfers;A lot of help with bathing/dressing/bathroom;Direct supervision/assist for medications management;Assist for transportation;Help with stairs or ramp for entrance   Equipment Recommendations  None recommended by PT    Recommendations for Other Services       Precautions / Restrictions Precautions Precautions: Fall Restrictions Weight Bearing Restrictions: No     Mobility  Bed Mobility Overal bed mobility: Needs Assistance Bed Mobility: Supine  to Sit     Supine to sit: Max assist     General bed mobility comments: assistance for trunk support to sit upright. increased time and effort provided    Transfers Overall transfer level: Needs assistance Equipment used: Rolling walker (2 wheels) Transfers: Sit to/from Stand Sit to Stand: Mod assist           General transfer comment: 2 standing bouts performed. verbal cues for hand placement and safety. lifting assistance provided to stand    Ambulation/Gait Ambulation/Gait assistance: Min assist Gait Distance (Feet): 30 Feet (performed x 2 bouts with seated rest break) Assistive device: Rolling walker (2 wheels) Gait Pattern/deviations: Step-through pattern, Decreased stride length, Trunk flexed Gait velocity: decreased     General Gait Details: steadying assistance provided. chair follow for safety as activity tolerance limited by fatigue/muscle weakness. she ambulated 2 bouts of 30 ft with cues for standing closer to rolling walker for UE support and to avoid trunk flexion.   Stairs             Wheelchair Mobility    Modified Rankin (Stroke Patients Only)       Balance Overall balance assessment: Needs assistance Sitting-balance support: Feet supported Sitting balance-Leahy Scale: Fair     Standing balance support: Bilateral upper extremity supported Standing balance-Leahy Scale: Fair Standing balance comment: using rolling walker for UE support                            Cognition Arousal/Alertness: Awake/alert Behavior During Therapy: WFL for tasks assessed/performed Overall Cognitive Status: Within Functional Limits for tasks assessed  General Comments: patient able to follow single step commands without diffculty        Exercises      General Comments        Pertinent Vitals/Pain Pain Assessment Pain Assessment: No/denies pain    Home Living                           Prior Function            PT Goals (current goals can now be found in the care plan section) Acute Rehab PT Goals Patient Stated Goal: to be able to walk PT Goal Formulation: With patient Time For Goal Achievement: 12/24/21 Potential to Achieve Goals: Fair Progress towards PT goals: Progressing toward goals    Frequency    Min 2X/week      PT Plan Current plan remains appropriate    Co-evaluation              AM-PAC PT "6 Clicks" Mobility   Outcome Measure  Help needed turning from your back to your side while in a flat bed without using bedrails?: A Little Help needed moving from lying on your back to sitting on the side of a flat bed without using bedrails?: A Little Help needed moving to and from a bed to a chair (including a wheelchair)?: A Lot Help needed standing up from a chair using your arms (e.g., wheelchair or bedside chair)?: A Lot Help needed to walk in hospital room?: A Little Help needed climbing 3-5 steps with a railing? : Total 6 Click Score: 14    End of Session Equipment Utilized During Treatment: Gait belt Activity Tolerance: Patient tolerated treatment well Patient left: in chair;with call bell/phone within reach;with bed alarm set Nurse Communication: Mobility status PT Visit Diagnosis: Unsteadiness on feet (R26.81);Muscle weakness (generalized) (M62.81)     Time: 4696-2952 PT Time Calculation (min) (ACUTE ONLY): 22 min  Charges:  $Gait Training: 8-22 mins                     Minna Merritts, PT, MPT    Percell Locus 12/12/2021, 12:20 PM

## 2021-12-12 NOTE — Progress Notes (Signed)
PROGRESS NOTE    Shannon Chen  LOV:[id number] DOB: 12/24/1945 DOA: 12/09/2021 PCP: Remi Haggard, FNP  205A/205A-AA  LOS: 2 days   Brief hospital course:   Assessment & Plan: 76 year old with history of CAD, HOCM, AV block status post PPM, HTN, HLD, prior CVA admitted for bilateral lower extremity weakness..  About 5 days ago she started experiencing bilateral lower extremity weakness and difficulty walking.  In the ED she was found to have rhabdomyolysis.  CT of the head, cervical thoracic and lumbar spine did not show any acute pathology.  *Bilateral lower extremity weakness 2/2 myopathy Rhabdomyolysis.   Sudden onset about 5 days ago.  Reported numbness and weakness but no pain.  CT head, cervical, lumbar and thoracic spine are unremarkable.  Unable to get MRI due to pacemaker.  Neurology team consulted.  CK elevated at 6906, folate, B12 low normal, TSH normal.  CRP wnl, ESR only mildly elevated at 37. --Vit D 15 (low), which may contribute to proximal myopathy. -GGT normal, confirming that elevated AST/ALT are secondary to muscle injury and not hepatic injury --started on steroid 1 mg/kg dosing of prednisone. Plan: -Continue 65 mg daily prednisone for 4-6 weeks (until rheumatology follow-up), taper per outpatient provider and pending clinical course and workup  --add protonix and Ca supplementation while on steroid -outpatient rheumatologist/neurologist to consider pneumocystis jiroveci prophylaxis if high dose steroids will be continued longer term -Outpatient can consider EMG/NCS and potential biopsy if antibody testing is unrevealing (will take a few weeks to come back) -CT chest/abdomen/pelvis w/ contrast for malignancy screening now that AKI is resolved -Pending: serum myoglobin, myositis panel, anti-HMG CoA reductase antibody, MMA  -cont Vitamin D repletion, 50,000 units weekly for 8 weeks --cont vit B12 supplement --Continue to hold statin    Acute kidney injury  superimposed on CKD ([name]) Baseline creatinine 0.9.  Admission creatinine 2.08, slowly improving with IV fluids.   --d/c MIVF today   Hypertension - cont amlodipine and Toprol - Holding home finerenone due to AKI   Hypertrophic cardiomyopathy ([place]) Patient is euvolemic at this time.  She is chest pain-free with stable EKG and troponins within her baseline range   Type 2 diabetes mellitus with hyperlipidemia [facility]) She is on [place] for heart failure.  Hemoglobin A1c 6.4 --SSI   Chronic obstructive pulmonary disease ([name]) --cont daily bronchodilators    DVT prophylaxis: Lovenox SQ Code Status: Full code  Family Communication:  Level of care: Med-Surg Dispo:   The patient is from: home Anticipated d/c is to: SNF rehab Anticipated d/c date is: tomorrow   Subjective and Interval History:  Pt complained of feeling gassy and left heel hurting.   Objective: Vitals:   12/11/21 2045 12/12/21 0459 12/12/21 0725 12/12/21 1531  BP: (!) 126/49 (!) 187/85 (!) 134/52 (!) 145/89  Pulse: 70 74 74 82  Resp: '18 18 14 12  ' Temp: 98.1 F (36.7 C) 98.3 F (36.8 C) 97.6 F (36.4 C) (!) 97.5 F (36.4 C)  TempSrc: Oral Oral Oral Oral  SpO2: 97% 95% 96% 95%  Weight:      Height:        Intake/Output Summary (Last 24 hours) at 12/12/2021 1637 Last data filed at 12/12/2021 1034 Gross per 24 hour  Intake 2576.2 ml  Output 950 ml  Net 1626.2 ml   Filed Weights   12/09/21 0952  Weight: 64.9 kg    Examination:   Constitutional: NAD, AAOx3 HEENT: conjunctivae and lids normal, EOMI CV: No cyanosis.  RESP: normal respiratory effort, on RA Neuro: II - XII grossly intact.   Psych: Normal mood and affect.     Data Reviewed: I have personally reviewed labs and imaging studies  Time spent: 35 minutes  Enzo Bi, MD [facility] If 7PM-7AM, please contact night-coverage 12/12/2021, 4:37 PM

## 2021-12-12 NOTE — Progress Notes (Signed)
Occupational Therapy Treatment Patient Details Name: Shannon Chen MRN: [id number] DOB: Nov 08, 1945 Today's Date: 12/12/2021   History of present illness Patient is a 76 year old female with medical history significant of CAD, HOCM, AV block s/p PPM, hypertension, hyperlipidemia, prior CVA who presents to the ED with complaints of bilateral lower extremity weakness and transient numbness in legs.   OT comments  Ms [place] was seen for OT treatment on this date. Upon arrival to room pt reclined in bed, family at bed side, pt agreeable to tx. Pt requires MOD A exit bed. Initial MOD A sit<>stand improving to MIN A with cues for technique. CGA grooming standing sink side. MIN A + RW for ADL t/f, tolerates 40 ft x2 with seated rest break as pt fatigues quickly, cues for safety. Pt making good progress toward goals, will continue to follow POC. Discharge recommendation remains appropriate.     Recommendations for follow up therapy are one component of a multi-disciplinary discharge planning process, led by the attending physician.  Recommendations may be updated based on patient status, additional functional criteria and insurance authorization.    Follow Up Recommendations  Skilled nursing-short term rehab (<3 hours/day)    Assistance Recommended at Discharge Frequent or constant Supervision/Assistance  Patient can return home with the following  Direct supervision/assist for medications management;Help with stairs or ramp for entrance;Assistance with cooking/housework;A little help with walking and/or transfers;A little help with bathing/dressing/bathroom   Equipment Recommendations  Other (comment) (defer)    Recommendations for Other Services      Precautions / Restrictions Precautions Precautions: Fall Restrictions Weight Bearing Restrictions: No       Mobility Bed Mobility Overal bed mobility: Needs Assistance Bed Mobility: Sit to Supine, Supine to Sit     Supine to sit: Mod  assist Sit to supine: Mod assist        Transfers Overall transfer level: Needs assistance Equipment used: Rolling walker (2 wheels) Transfers: Sit to/from Stand Sit to Stand: Min assist           General transfer comment: initial MOD A improves with cues for technique     Balance Overall balance assessment: Needs assistance Sitting-balance support: Feet supported Sitting balance-Leahy Scale: Fair     Standing balance support: Single extremity supported, During functional activity Standing balance-Leahy Scale: Fair                             ADL either performed or assessed with clinical judgement   ADL Overall ADL's : Needs assistance/impaired                                       General ADL Comments: CGA grooming standing sink side. MIN A + RW for ADL t/f, seated rest break as pt fatigues quickly and cues for safety. MAX A for LB access seated EOB.      Cognition Arousal/Alertness: Awake/alert Behavior During Therapy: WFL for tasks assessed/performed Overall Cognitive Status: Within Functional Limits for tasks assessed                                           Pertinent Vitals/ Pain       Pain Assessment Pain Assessment: No/denies pain   Frequency  Min 2X/week  Progress Toward Goals  OT Goals(current goals can now be found in the care plan section)  Progress towards OT goals: Progressing toward goals  Acute Rehab OT Goals Patient Stated Goal: to goto rehab OT Goal Formulation: With patient/family Time For Goal Achievement: 12/24/21 Potential to Achieve Goals: Fair ADL Goals Pt Will Perform Grooming: with supervision;sitting Pt Will Perform Lower Body Dressing: with min guard assist;sit to/from stand Pt Will Transfer to Toilet: with min assist;bedside commode;stand pivot transfer Pt Will Perform Toileting - Clothing Manipulation and hygiene: sit to/from stand;with supervision  Plan Discharge  plan remains appropriate;Frequency remains appropriate    Co-evaluation                 AM-PAC OT "6 Clicks" Daily Activity     Outcome Measure   Help from another person eating meals?: A Little Help from another person taking care of personal grooming?: A Little Help from another person toileting, which includes using toliet, bedpan, or urinal?: A Lot Help from another person bathing (including washing, rinsing, drying)?: A Lot Help from another person to put on and taking off regular upper body clothing?: A Little Help from another person to put on and taking off regular lower body clothing?: A Lot 6 Click Score: 15    End of Session    OT Visit Diagnosis: Unsteadiness on feet (R26.81);Muscle weakness (generalized) (M62.81)   Activity Tolerance Patient tolerated treatment well;Patient limited by fatigue   Patient Left in bed;with call bell/phone within reach;with bed alarm set;with family/visitor present   Nurse Communication          Time: 2423-5361 OT Time Calculation (min): 9 min  Charges: OT General Charges $OT Visit: 1 Visit OT Treatments $Self Care/Home Management : 8-22 mins  Kathie Dike, M.S. OTR/L  12/12/21, 3:10 PM  ascom [phone]

## 2021-12-12 NOTE — Progress Notes (Signed)
Images of possible rashes, which are not pathognomic on my eval though not my area of expertise

## 2021-12-12 NOTE — TOC Progression Note (Signed)
Transition of Care [facility]) - Progression Note    Patient Details  Name: Shannon Chen MRN: [id number] Date of Birth: 01/26/1946  Transition of Care [facility]) CM/SW Contact  Beverly Sessions, RN Phone Number: 12/12/2021, 10:40 AM  Clinical Narrative:           Insurance auth approved Auth ID [id number] Approved 10/25-10/27  MD notified to determine if patient is ready for discharge Sharyn Lull at [facility] notified  Expected Discharge Plan and Services                                                 Social Determinants of Health ([place]) Interventions    Readmission Risk Interventions    12/10/2021    2:16 PM 12/15/2020    4:10 PM  Readmission Risk Prevention Plan  Transportation Screening Complete Complete  PCP or Specialist Appt within 5-7 Days  Complete  Home Care Screening  Complete  Medication Review (RN CM)  Complete  HRI or Home Care Consult Complete   Social Work Consult for [place] Planning/Counseling Complete   Palliative Care Screening Not Applicable   Medication Review [employer]) Complete

## 2021-12-12 NOTE — Progress Notes (Signed)
Neurology Progress Note  Patient ID: Shannon Chen is a 76 y.o. with PMHx of  has a past medical history of Arthritis, AV block, Mobitz II, CAD (coronary artery disease), Carotid artery disease ([place]), Essential hypertension, History of stress test, Hyperlipidemia, Hypertrophic cardiomyopathy ([place]), and Presence of permanent cardiac pacemaker.  Subjective: Reports she is feeling slightly stronger today, no acute complaints She continues to report that there were no symptom of proximal muscle weakness prior to 5 days ago, no rashes, some chronically dry skin but no new itchiness, no finger weakness  Exam: Current vital signs: BP (!) 134/52 (BP Location: Right Arm)   Pulse 74   Temp 97.6 F (36.4 C) (Oral)   Resp 14   Ht 5' 5.5" (1.664 m)   Wt 64.9 kg   SpO2 96%   BMI 23.43 kg/m  Vital signs in last 24 hours: Temp:  [97.6 F (36.4 C)-98.7 F (37.1 C)] 97.6 F (36.4 C) (10/26 0725) Pulse Rate:  [70-74] 74 (10/26 0725) Resp:  [14-18] 14 (10/26 0725) BP: (125-187)/(49-85) 134/52 (10/26 0725) SpO2:  [95 %-98 %] 96 % (10/26 0725)   Current Facility-Administered Medications:    acetaminophen (TYLENOL) tablet 650 mg, 650 mg, Oral, Q6H PRN, 650 mg at 12/11/21 0358 **OR** acetaminophen (TYLENOL) suppository 650 mg, 650 mg, Rectal, Q6H PRN, Jose Persia, MD   amLODipine (NORVASC) tablet 2.5 mg, 2.5 mg, Oral, Daily, Jose Persia, MD, 2.5 mg at 12/11/21 0940   aspirin EC tablet 81 mg, 81 mg, Oral, Daily, Jose Persia, MD, 81 mg at 12/11/21 0940   clopidogrel (PLAVIX) tablet 75 mg, 75 mg, Oral, Daily, Jose Persia, MD, 75 mg at 12/11/21 0940   cyanocobalamin (VITAMIN B12) tablet 1,000 mcg, 1,000 mcg, Oral, Daily, Shamecca Whitebread L, MD, 1,000 mcg at 12/11/21 0940   docusate sodium (COLACE) capsule 100 mg, 100 mg, Oral, BID, Jose Persia, MD, 100 mg at 12/11/21 2139   enoxaparin (LOVENOX) injection 40 mg, 40 mg, Subcutaneous, Q24H, Beers, Shanon Brow, RPH, 40 mg at 12/11/21 2139    fluticasone furoate-vilanterol (BREO ELLIPTA) 200-25 MCG/ACT 1 puff, 1 puff, Inhalation, Daily, 1 puff at 12/11/21 1340 **AND** umeclidinium bromide (INCRUSE ELLIPTA) 62.5 MCG/ACT 1 puff, 1 puff, Inhalation, Daily, Jose Persia, MD, 1 puff at 80/03/49 1791   folic acid (FOLVITE) tablet 1 mg, 1 mg, Oral, Daily, Jose Persia, MD, 1 mg at 12/11/21 0940   guaiFENesin (ROBITUSSIN) 100 MG/5ML liquid 5 mL, 5 mL, Oral, Q4H PRN, Amin, Ankit Chirag, MD   hydrALAZINE (APRESOLINE) injection 10 mg, 10 mg, Intravenous, Q4H PRN, Amin, Ankit Chirag, MD, 10 mg at 12/12/21 0507   insulin aspart (novoLOG) injection 0-6 Units, 0-6 Units, Subcutaneous, TID WC, Cartel Mauss L, MD, 1 Units at 12/11/21 1616   ipratropium-albuterol (DUONEB) 0.5-2.5 (3) MG/3ML nebulizer solution 3 mL, 3 mL, Nebulization, Q4H PRN, Amin, Ankit Chirag, MD   lactated ringers infusion, , Intravenous, Continuous, Amin, Ankit Chirag, MD, Stopped at 12/12/21 0505   meclizine (ANTIVERT) tablet 12.5 mg, 12.5 mg, Oral, BID PRN, Amin, Ankit Chirag, MD   metoprolol succinate (TOPROL-XL) 24 hr tablet 50 mg, 50 mg, Oral, Daily, Jose Persia, MD, 50 mg at 12/11/21 0940   metoprolol tartrate (LOPRESSOR) injection 5 mg, 5 mg, Intravenous, Q4H PRN, Amin, Ankit Chirag, MD   mirabegron ER (MYRBETRIQ) tablet 25 mg, 25 mg, Oral, Daily, Jose Persia, MD, 25 mg at 12/11/21 0938   mirtazapine (REMERON) tablet 7.5 mg, 7.5 mg, Oral, QHS, Jose Persia, MD, 7.5 mg at 12/11/21 2139  multivitamin with minerals tablet 1 tablet, 1 tablet, Oral, Daily, Jose Persia, MD, 1 tablet at 12/11/21 0940   polyethylene glycol (MIRALAX / GLYCOLAX) packet 17 g, 17 g, Oral, Daily PRN, Jose Persia, MD   predniSONE (DELTASONE) tablet 65 mg, 65 mg, Oral, Q breakfast, Jose Persia, MD, 65 mg at 12/11/21 7867   sodium chloride flush (NS) 0.9 % injection 3 mL, 3 mL, Intravenous, Q12H, Jose Persia, MD, 3 mL at 12/11/21 2142   thiamine (VITAMIN B1) tablet 100 mg,  100 mg, Oral, Daily, Jose Persia, MD, 100 mg at 12/11/21 0940   traZODone (DESYREL) tablet 50 mg, 50 mg, Oral, QHS PRN, Amin, Ankit Chirag, MD   Vitamin D (Ergocalciferol) (DRISDOL) 1.25 MG (50000 UNIT) capsule 50,000 Units, 50,000 Units, Oral, Q7 days, Krisann Mckenna L, MD, 50,000 Units at 12/11/21 1607    Gen: In bed, comfortable  Resp: non-labored breathing, no grossly audible wheezing Cardiac: Perfusing extremities well  Abd: soft, nt Extremities: Stable bruising on her arms  Neuro: MS: Awake, alert, oriented to time, person, place, situation CN: Face symmetric, tracking examiner Motor: stable to slightly improved from yesterday 4/5 deltoids otherwise 5/5 in the UE, 2-3/5 bilateral hip flexion, 5/5 knee extension, 4/5 knee flexion (a slightly weaker on the left than the right), 5/5 foot plantar and dorsiflexion on the right, 5/5 foot plantarflexion on the left with 4/5 foot dorsiflexion on the left Sensory: Remains intact DTR: 3+ and symmetric in the biceps / brachioradialis, 2+ symmetric patellar Gait: Requires 2 person assist to sit at the side of the bed.  Takes multiple attempts to rise from the side of the bed.    Pertinent Labs:   Basic Metabolic Panel: Recent Labs  Lab 12/09/21 1049 12/10/21 0348 12/11/21 0407 12/12/21 0528  NA 142 141 139 141  K 4.8 4.3 3.9 3.8  CL 115* 116* 112* 113*  CO2 20* 19* 20* 22  GLUCOSE 100* 83 95 90  BUN 40* 34* 34* 37*  CREATININE 2.08* 1.61* 1.35* 1.19*  CALCIUM 9.0 8.3* 9.2 8.8*  MG  --   --  1.8 1.9    CBC: Recent Labs  Lab 12/09/21 1049 12/10/21 0348 12/11/21 0407 12/12/21 0528  WBC 5.3 4.8 10.3 11.4*  NEUTROABS  --  2.7  --   --   HGB 12.1 11.1* 11.0* 11.1*  HCT 38.8 35.7* 34.0* 34.1*  MCV 85.5 84.6 82.3 83.6  PLT 188 176 184 182    Coagulation Studies: No results for input(s): "LABPROT", "INR" in the last 72 hours.     Latest Reference Range & Units 12/10/21 15:40  Aldolase 3.3 - 10.3 U/L 26.7 (H)  (H):  Data is abnormally high   Latest Reference Range & Units 12/10/21 15:40  Vitamin D, 25-Hydroxy 30 - 100 ng/mL 15.65 (L)  (L): Data is abnormally low   Component Ref Range & Units 04:07 (12/11/21) 1 d ago (12/10/21) 1 d ago (12/10/21) 2 d ago (12/09/21) 11 mo ago (01/02/21) 1 yr ago (05/18/20)  Total CK 38 - 234 U/L 4,647 High   6,079 High  CM  6,635 High  CM  6,906 High  CM  58 CM  65 CM    Lab Results  Component Value Date   ALT 94 (H) 12/12/2021   AST 60 (H) 12/12/2021   GGT 24 12/11/2021   ALKPHOS 54 12/12/2021   BILITOT 0.6 12/12/2021   ESR minimally elevated at 37 CRP 0.5 TSH normal at 1.225 A1c 6.4% RPR negative  B12 low normal at 339 with MMA pending Anti-Jo-1 negative Myoglobin, serum 4,189  Assessment: 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 Vitamin D deficiency can play a role in proximal toxic/metabolic myopathy although often with CK levels being normal and with more severe deficiency than this patient has.  Vitamin D deficiency can also play a role in risk for inflammatory myopathies. Low B12 can also be an exacerbating factor. Favor leukocytosis is secondary to steroid treatment.   Fortunately patient has stabilized rapidly, and is even improving in her strength.  Therefore will continue current dose of steroids pending outpatient follow-up  Recommendations: -Continue 65 mg daily prednisone for 4-6 weeks (until rheumatology follow-up), taper per outpatient provider and pending clinical course and workup   -Vitamin D already ordered, additionally calcium supplementation while on steroids, PPI, to be ordered by primary team -outpatient  rheumatologist/neurologist to consider pneumocystis jiroveci prophylaxis if high dose steroids will be continued longer term -Pending: serum myoglobin, myositis panel, anti-HMG CoA reductase antibody, MMA -Vitamin D repletion, 50,000 units weekly for 8 weeks, further repletion/monitoring per primary team/PCP -Continue B12 1000 mcg oral -GGT normal, confirming that elevated AST/ALT are secondary to muscle injury and not hepatic injury -Continue to hold statin, would not restart until anti-HMG-CoA results and pending other workup -Glucose checks given patient is receiving steroids and has an elevated A1c at 6.4%; appreciate primary team adjustment for prandial hyperglycemia secondary to steroids -Outpatient can consider EMG/NCS and potential biopsy if antibody testing is unrevealing (will take a few weeks to come back) -CT chest/abdomen/pelvis w/ contrast for malignancy screening now that AKI is resolved -outpatient recommend patient is up-to-date on colonoscopy and mammogram  -Neurology will follow-up CT/chest abdomen pelvis but otherwise will be available as needed going forward  Lesleigh Noe MD-PhD [facility] [phone]    Greater than 50 minutes were spent in care of this patient today, the majority at bedside

## 2021-12-13 DIAGNOSIS — Z01810 Encounter for preprocedural cardiovascular examination: Secondary | ICD-10-CM | POA: Diagnosis not present

## 2021-12-13 DIAGNOSIS — I723 Aneurysm of iliac artery: Secondary | ICD-10-CM | POA: Diagnosis not present

## 2021-12-13 DIAGNOSIS — E119 Type 2 diabetes mellitus without complications: Secondary | ICD-10-CM | POA: Diagnosis not present

## 2021-12-13 DIAGNOSIS — I7789 Other specified disorders of arteries and arterioles: Secondary | ICD-10-CM

## 2021-12-13 DIAGNOSIS — I10 Essential (primary) hypertension: Secondary | ICD-10-CM

## 2021-12-13 DIAGNOSIS — G729 Myopathy, unspecified: Secondary | ICD-10-CM

## 2021-12-13 DIAGNOSIS — N2889 Other specified disorders of kidney and ureter: Secondary | ICD-10-CM | POA: Diagnosis not present

## 2021-12-13 DIAGNOSIS — G130 Paraneoplastic neuromyopathy and neuropathy: Secondary | ICD-10-CM | POA: Diagnosis not present

## 2021-12-13 DIAGNOSIS — Z87891 Personal history of nicotine dependence: Secondary | ICD-10-CM

## 2021-12-13 DIAGNOSIS — I25118 Atherosclerotic heart disease of native coronary artery with other forms of angina pectoris: Secondary | ICD-10-CM

## 2021-12-13 DIAGNOSIS — I739 Peripheral vascular disease, unspecified: Secondary | ICD-10-CM

## 2021-12-13 DIAGNOSIS — R531 Weakness: Secondary | ICD-10-CM | POA: Diagnosis not present

## 2021-12-13 LAB — COMPREHENSIVE METABOLIC PANEL
ALT: 95 U/L — ABNORMAL HIGH (ref 0–44)
AST: 56 U/L — ABNORMAL HIGH (ref 15–41)
Albumin: 2.9 g/dL — ABNORMAL LOW (ref 3.5–5.0)
Alkaline Phosphatase: 79 U/L (ref 38–126)
Anion gap: 9 (ref 5–15)
BUN: 47 mg/dL — ABNORMAL HIGH (ref 8–23)
CO2: 22 mmol/L (ref 22–32)
Calcium: 9 mg/dL (ref 8.9–10.3)
Chloride: 111 mmol/L (ref 98–111)
Creatinine, Ser: 1.47 mg/dL — ABNORMAL HIGH (ref 0.44–1.00)
GFR, Estimated: 36 mL/min — ABNORMAL LOW (ref >60)
Glucose, Bld: 111 mg/dL — ABNORMAL HIGH (ref 70–99)
Potassium: 3.7 mmol/L (ref 3.5–5.1)
Sodium: 142 mmol/L (ref 135–145)
Total Bilirubin: 0.8 mg/dL (ref 0.3–1.2)
Total Protein: 6.5 g/dL (ref 6.5–8.1)

## 2021-12-13 LAB — GLUCOSE, CAPILLARY
Glucose-Capillary: 103 mg/dL — ABNORMAL HIGH (ref 70–99)
Glucose-Capillary: 110 mg/dL — ABNORMAL HIGH (ref 70–99)
Glucose-Capillary: 154 mg/dL — ABNORMAL HIGH (ref 70–99)
Glucose-Capillary: 200 mg/dL — ABNORMAL HIGH (ref 70–99)

## 2021-12-13 LAB — CBC
HCT: 34.7 % — ABNORMAL LOW (ref 36.0–46.0)
Hemoglobin: 11.2 g/dL — ABNORMAL LOW (ref 12.0–15.0)
MCH: 27.3 pg (ref 26.0–34.0)
MCHC: 32.3 g/dL (ref 30.0–36.0)
MCV: 84.4 fL (ref 80.0–100.0)
Platelets: 197 10*3/uL (ref 150–400)
RBC: 4.11 MIL/uL (ref 3.87–5.11)
RDW: 16.2 % — ABNORMAL HIGH (ref 11.5–15.5)
WBC: 13.3 10*3/uL — ABNORMAL HIGH (ref 4.0–10.5)
nRBC: 0 % (ref 0.0–0.2)

## 2021-12-13 LAB — CK: Total CK: 2301 U/L — ABNORMAL HIGH (ref 38–234)

## 2021-12-13 LAB — MAGNESIUM: Magnesium: 2 mg/dL (ref 1.7–2.4)

## 2021-12-13 MED ORDER — LACTATED RINGERS IV SOLN: INTRAVENOUS | Status: AC

## 2021-12-13 MED ORDER — CALCIUM CARBONATE 1250 (500 CA) MG PO TABS
500.0000 mg | ORAL_TABLET | Freq: Two times a day (BID) | ORAL | Status: DC
  Administered 2021-12-13 – 2021-12-23 (×18): 1250 mg via ORAL
  Filled 2021-12-13 (×20): qty 1

## 2021-12-13 NOTE — Progress Notes (Signed)
Physical Therapy Treatment Patient Details Name: Shannon Chen MRN: [id number] DOB: Aug 26, 1945 Today's Date: 12/13/2021   History of Present Illness Patient is a 76 year old female with medical history significant of CAD, HOCM, AV block s/p PPM, hypertension, hyperlipidemia, prior CVA who presents to the ED with complaints of bilateral lower extremity weakness and transient numbness in legs.    PT Comments    Patient is agreeable to PT. She feels like her leg strength has improved. She ambulated with only Min guard assistance with verbal cues for safety. She continues to require assistance with bed mobility and transfers. Recommend to continue PT to maximize independence and decrease caregiver burden.    Recommendations for follow up therapy are one component of a multi-disciplinary discharge planning process, led by the attending physician.  Recommendations may be updated based on patient status, additional functional criteria and insurance authorization.  Follow Up Recommendations  Skilled nursing-short term rehab (<3 hours/day) Can patient physically be transported by private vehicle: No   Assistance Recommended at Discharge Frequent or constant Supervision/Assistance  Patient can return home with the following A little help with walking and/or transfers;A little help with bathing/dressing/bathroom;Help with stairs or ramp for entrance;Assist for transportation   Equipment Recommendations  None recommended by PT    Recommendations for Other Services       Precautions / Restrictions Precautions Precautions: Fall Restrictions Weight Bearing Restrictions: No     Mobility  Bed Mobility Overal bed mobility: Needs Assistance Bed Mobility: Supine to Sit     Supine to sit: Mod assist     General bed mobility comments: assistance for trunk support to sit upright    Transfers Overall transfer level: Needs assistance Equipment used: Rolling walker (2 wheels) Transfers: Sit  to/from Stand Sit to Stand: Min assist           General transfer comment: lifting assistance required for standing. occasional cues for safety    Ambulation/Gait Ambulation/Gait assistance: Min guard Gait Distance (Feet): 26 Feet Assistive device: Rolling walker (2 wheels) Gait Pattern/deviations: Step-through pattern, Decreased stride length Gait velocity: decreased     General Gait Details: occasional cues for safety, standing closer to rolling walker. Min guard provided. patient declined further ambulation   Stairs             Wheelchair Mobility    Modified Rankin (Stroke Patients Only)       Balance Overall balance assessment: Needs assistance Sitting-balance support: Feet supported Sitting balance-Leahy Scale: Fair     Standing balance support: Single extremity supported Standing balance-Leahy Scale: Fair Standing balance comment: RW for UE support                            Cognition Arousal/Alertness: Awake/alert Behavior During Therapy: WFL for tasks assessed/performed Overall Cognitive Status: Within Functional Limits for tasks assessed                                          Exercises      General Comments        Pertinent Vitals/Pain Pain Assessment Pain Assessment: No/denies pain    Home Living                          Prior Function  PT Goals (current goals can now be found in the care plan section) Acute Rehab PT Goals Patient Stated Goal: to be able to walk PT Goal Formulation: With patient Time For Goal Achievement: 12/24/21 Potential to Achieve Goals: Fair Progress towards PT goals: Progressing toward goals    Frequency    Min 2X/week      PT Plan      Co-evaluation              AM-PAC PT "6 Clicks" Mobility   Outcome Measure  Help needed turning from your back to your side while in a flat bed without using bedrails?: A Little Help needed moving  from lying on your back to sitting on the side of a flat bed without using bedrails?: A Little Help needed moving to and from a bed to a chair (including a wheelchair)?: A Lot Help needed standing up from a chair using your arms (e.g., wheelchair or bedside chair)?: A Little Help needed to walk in hospital room?: A Little Help needed climbing 3-5 steps with a railing? : A Lot 6 Click Score: 16    End of Session Equipment Utilized During Treatment: Gait belt Activity Tolerance: Patient tolerated treatment well Patient left: in chair;with call bell/phone within reach;with chair alarm set   PT Visit Diagnosis: Unsteadiness on feet (R26.81);Muscle weakness (generalized) (M62.81)     Time: 2536-6440 PT Time Calculation (min) (ACUTE ONLY): 17 min  Charges:  $Therapeutic Activity: 8-22 mins                    Donna Bernard, PT, MPT    Ina Homes 12/13/2021, 1:08 PM

## 2021-12-13 NOTE — Care Management Important Message (Signed)
Important Message  Patient Details  Name: Shannon Chen MRN: [id number] Date of Birth: 05-22-1945   Medicare Important Message Given:  Yes     [name] 12/13/2021, 12:34 PM

## 2021-12-13 NOTE — Consult Note (Signed)
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 distension of the abdominal aorta and with no aneurysm. Severe atherosclerotic plaque. Four-vessel coronary calcification. Partially visualized cardiac leads. IMPRESSION: 1. No acute displaced fracture or traumatic listhesis of the cervical, thoracic, lumbar spine. 2. No severe osseous neural foraminal or central canal stenosis of the cervical, thoracic, lumbar spine. 3. 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 4. Interval increase in size of a aneurysmal left common  iliac artery measuring up to 2.1 cm. 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 5. Aortic Atherosclerosis (ICD10-I70.0) - severe including four-vessel coronary calcification. 6.  Emphysema (ICD10-J43.9). Electronically Signed   By: Iven Finn M.D.   On: 12/09/2021 21:51   CT  THORACIC SPINE WO CONTRAST  Result Date: 12/09/2021 CLINICAL DATA:  Myelopathy, acute, cervical spine; Myelopathy, acute, lumbar spine; Myelopathy, acute, thoracic spine EXAM: CT CERVICAL, THORACIC, AND LUMBAR SPINE WITHOUT CONTRAST TECHNIQUE: Multidetector CT imaging of the cervical, thoracic and lumbar spine was performed without intravenous contrast. Multiplanar CT image reconstructions were also generated. RADIATION DOSE REDUCTION: This exam was performed according to the departmental dose-optimization program which includes automated exposure control, adjustment of the mA and/or kV according to patient size and/or use of iterative reconstruction technique. COMPARISON:  CT abdomen pelvis 11/17/2017, CT abdomen pelvis 03/14/2020 FINDINGS: CT CERVICAL SPINE FINDINGS Alignment: Normal. Skull base and vertebrae: Multilevel mild-to-moderate degenerative changes spine most prominent at the C5 through C7 levels. Associated intervertebral disc space narrowing at these levels. No severe osseous central canal or neural foraminal stenosis. No acute fracture. No aggressive appearing focal osseous lesion or focal pathologic process. Soft tissues and spinal canal: No prevertebral fluid or swelling. No visible canal hematoma. Upper chest: Centrilobular emphysematous changes. Other: 1.2 cm hypodense lesion within the right thyroid gland. Not clinically significant; no follow-up imaging recommended (ref: J Am Coll Radiol. 2015 Feb;12(2): 143-50).Atherosclerotic plaque of the carotid arteries within the neck. CT THORACIC SPINE FINDINGS Alignment: Normal. Vertebrae: No severe osseous neural foraminal or central canal stenosis. No acute fracture or focal pathologic process. Paraspinal and other soft tissues: Negative. Disc levels: Developing intervertebral disc space vacuum phenomenon at a couple levels. Otherwise maintained. CT LUMBAR SPINE FINDINGS Segmentation: 5 lumbar type vertebrae. Alignment: Normal. Vertebrae: Mild  multilevel degenerative changes spine. Diffusely decreased bone density. No severe osseous central canal or neural foraminal stenosis. No acute fracture or focal pathologic process. Paraspinal and other soft tissues: Negative. Disc levels: Intervertebral disc space vacuum phenomenon at the L5-S1 level. 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 Redemonstration of several focal areas of distension of the abdominal aorta and with no aneurysm. Severe atherosclerotic plaque. Four-vessel coronary calcification. Partially visualized cardiac leads. IMPRESSION: 1. No acute displaced fracture or traumatic listhesis of the cervical, thoracic, lumbar spine. 2. No severe osseous neural foraminal or central canal stenosis of the cervical, thoracic, lumbar spine. 3. 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 4. 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 5. Aortic Atherosclerosis (ICD10-I70.0) - severe including four-vessel coronary calcification. 6.  Emphysema (ICD10-J43.9). Electronically Signed   By: Iven Finn M.D.   On: 12/09/2021 21:51   CT LUMBAR SPINE WO CONTRAST  Result Date: 12/09/2021 CLINICAL DATA:  Myelopathy, acute, cervical spine; Myelopathy, acute, lumbar spine; Myelopathy, acute, thoracic spine EXAM: CT CERVICAL, THORACIC, AND LUMBAR SPINE WITHOUT CONTRAST TECHNIQUE: Multidetector CT imaging of the cervical, thoracic and lumbar spine was performed without intravenous contrast. Multiplanar CT image reconstructions were also generated. RADIATION DOSE REDUCTION: This exam was performed according to the  departmental dose-optimization program which includes automated exposure  control, adjustment of the mA and/or kV according to patient size and/or use of iterative reconstruction technique. COMPARISON:  CT abdomen pelvis 11/17/2017, CT abdomen pelvis 03/14/2020 FINDINGS: CT CERVICAL SPINE FINDINGS Alignment: Normal. Skull base and vertebrae: Multilevel mild-to-moderate degenerative changes spine most prominent at the C5 through C7 levels. Associated intervertebral disc space narrowing at these levels. No severe osseous central canal or neural foraminal stenosis. No acute fracture. No aggressive appearing focal osseous lesion or focal pathologic process. Soft tissues and spinal canal: No prevertebral fluid or swelling. No visible canal hematoma. Upper chest: Centrilobular emphysematous changes. Other: 1.2 cm hypodense lesion within the right thyroid gland. Not clinically significant; no follow-up imaging recommended (ref: J Am Coll Radiol. 2015 Feb;12(2): 143-50).Atherosclerotic plaque of the carotid arteries within the neck. CT THORACIC SPINE FINDINGS Alignment: Normal. Vertebrae: No severe osseous neural foraminal or central canal stenosis. No acute fracture or focal pathologic process. Paraspinal and other soft tissues: Negative. Disc levels: Developing intervertebral disc space vacuum phenomenon at a couple levels. Otherwise maintained. CT LUMBAR SPINE FINDINGS Segmentation: 5 lumbar type vertebrae. Alignment: Normal. Vertebrae: Mild multilevel degenerative changes spine. Diffusely decreased bone density. No severe osseous central canal or neural foraminal stenosis. No acute fracture or focal pathologic process. Paraspinal and other soft tissues: Negative. Disc levels: Intervertebral disc space vacuum phenomenon at the L5-S1 level. Other: Interval increase in size of an aneurysmal right common iliac artery measuring up to 2.8 cm as well as of an aneurysmal aneurysmal left common iliac artery measuring up to 2.1  cm. 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 Redemonstration of several focal areas of distension of the abdominal aorta and with no aneurysm. Severe atherosclerotic plaque. Four-vessel coronary calcification. Partially visualized cardiac leads. IMPRESSION: 1. No acute displaced fracture or traumatic listhesis of the cervical, thoracic, lumbar spine. 2. No severe osseous neural foraminal or central canal stenosis of the cervical, thoracic, lumbar spine. 3. 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 4. 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 5. Aortic Atherosclerosis (ICD10-I70.0) - severe including four-vessel coronary calcification. 6.  Emphysema (ICD10-J43.9). Electronically Signed   By: Iven Finn M.D.   On: 12/09/2021 21:51   CT HEAD WO CONTRAST (5MM)  Result Date: 12/09/2021 CLINICAL DATA:  Hypertension, diabetes mellitus, atrial fibrillation, generalized weakness EXAM: CT HEAD WITHOUT CONTRAST TECHNIQUE: Contiguous axial images were obtained from the base of the skull through the vertex without intravenous contrast. RADIATION DOSE REDUCTION: This exam was performed according to the departmental dose-optimization program which includes automated exposure control, adjustment of the mA and/or kV according to patient size and/or use of iterative reconstruction technique. COMPARISON:  01/06/2021 FINDINGS: Brain: Generalized atrophy. Normal ventricular morphology. No midline shift or mass effect. Small vessel chronic ischemic changes of deep cerebral white matter. BILATERAL old basal ganglia lacunar infarcts. Small old infarct RIGHT cerebellum. No intracranial hemorrhage, mass lesion, evidence of acute infarction, or extra-axial fluid collection. Vascular: Atherosclerotic calcifications of  internal carotid arteries at skull base. No hyperdense vessels. Skull: Intact Sinuses/Orbits: Partial opacification of BILATERAL ethmoid air cells. Remaining paranasal sinuses and mastoid air cells clear Other: Significant ossification within falx. IMPRESSION: Atrophy with small vessel chronic ischemic changes of deep cerebral white matter. 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 No acute intracranial abnormalities. Electronically Signed   By: Elta Guadeloupe  Thornton Papas M.D.   On: 12/09/2021 18:31   DG Chest Portable 1 View  Result Date: 12/09/2021 CLINICAL DATA:  Short of breath, hypertension, generalized weakness EXAM: PORTABLE CHEST 1 VIEW COMPARISON:  12/28/2020 FINDINGS: Single frontal view of the chest demonstrates a stable cardiac silhouette. Dual lead pacemaker again noted. Stable atherosclerosis of the thoracic aorta. No acute airspace disease, effusion, or pneumothorax. No acute bony abnormality. IMPRESSION: 1. Stable chest, no acute process. Electronically Signed   By: Randa Ngo M.D.   On: 12/09/2021 18:14   CUP PACEART INCLINIC DEVICE CHECK  Result Date: 11/12/2021 Pacemaker check in clinic. Normal device function. Sensing, impedances consistent with previous measurements. Threshold for RV lead 0.5 V at 1.0 ms.  Reprogrammed RV output to 1.5 V at 1.0 ms.  Device programmed to maximize longevity. No mode switches.  5 episodes of high ventricular rates noted. Device programmed at appropriate safety margins. Histogram distribution appropriate for patient activity level. Device programmed to optimize intrinsic conduction. Estimated longevity 9.5 years. Patient enrolled in remote follow-up. Patient education completed.Myrtie Hawk, BSN, RN    Assessment and plan-   #myopathy CK trending down.  Patient is on prednisone.  Her strength has improved. Work-up in progress.  Neurology recommendation was reviewed. 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   #Small 9 mm hypoenhancing left kidney nodule, need further imaging characterization. Paraneoplastic myopathy has been reported in nonmetastatic RCC, rare though.  She is not able to get MRI due to permanent pacemaker.  Recommend CT renal protocol for further evaluation. Discussed with Dr. Billie Ruddy, given recent contrast exposure, will postpone CT to next week.  I recommend muscle biopsy to confirm the diagnosis of polymyositis.  Check LDH, tumor markers.  Outpatient and consider PET scan for evaluation of occult malignancy if polymyositis is confirmed.  Thank you for allowing me to participate in the care of this patient.  Plan was discussed with both neurology and hospitalist team  Earlie Server, MD, PhD Hematology Oncology 12/13/2021

## 2021-12-13 NOTE — Progress Notes (Signed)
PROGRESS NOTE    Shannon Chen  YJE:[id number] DOB: 1945-10-25 DOA: 12/09/2021 PCP: Remi Haggard, FNP  205A/205A-AA  LOS: 3 days   Brief hospital course:   Assessment & Plan: Shannon Chen is a 77 year old with history of CAD, HOCM, AV block status post PPM, HTN, prior CVA admitted for bilateral lower extremity weakness..  About 5 days ago she started experiencing bilateral lower extremity weakness and difficulty walking.  In the ED she was found to have rhabdomyolysis.  CT of the head, cervical thoracic and lumbar spine did not show any acute pathology.  *Bilateral lower extremity weakness 2/2 myopathy Rhabdomyolysis.   Sudden onset about 5 days ago.  Reported numbness and weakness but no pain.  CT head, cervical, lumbar and thoracic spine are unremarkable.  Unable to get MRI due to pacemaker.  Neurology team consulted.  CK elevated at 6906, folate, B12 low normal, TSH normal.  CRP wnl, ESR only mildly elevated at 37. --Vit D 15 (low), which may contribute to proximal myopathy. -GGT normal, confirming that elevated AST/ALT are secondary to muscle injury and not hepatic injury --started on steroid 1 mg/kg dosing of prednisone. Plan: -Continue 65 mg daily prednisone for 4-6 weeks (until rheumatology follow-up), taper per outpatient provider and pending clinical course and workup  --add protonix and Ca supplementation while on steroid -outpatient rheumatologist/neurologist to consider pneumocystis jiroveci prophylaxis if high dose steroids will be continued longer term -Pending: serum myoglobin, myositis panel, anti-HMG CoA reductase antibody, MMA  -cont Vitamin D repletion, 50,000 units weekly for 8 weeks --cont vit B12 supplement --Continue to hold statin --consult GenSurg today for muscle biopsy (either deltoid) to assess for PM.   Common iliac artery aneurysm  --enlarging as per current CT c/a/p --Vascular consult today, with Dr. Delana Meyer  --will need stent graft,  cardiology consult to clear for surgery  9 mm nodule in left kidney --interval increase in size.  Raises possibility for paraneoplastic syndrome as presented with PM. --Oncology consult today, with Dr. Tasia Catchings --CT renal protocal next week (to avoid too much contrasted studies at the same time)  Acute kidney injury superimposed on CKD ([place]) Baseline creatinine 0.9.  Admission creatinine 2.08, slowly improving with IV fluids.   --resume LR_0  since Cr increased some   Hypertension - cont amlodipine and Toprol - Holding home finerenone due to AKI   Hypertrophic cardiomyopathy ([place]) Patient is euvolemic at this time.  She is chest pain-free with stable EKG and troponins within her baseline range   Type 2 diabetes mellitus with hyperlipidemia [facility]) She is on [place] for heart failure.  Hemoglobin A1c 6.4 --SSI   Chronic obstructive pulmonary disease ([name]) --cont daily bronchodilators    DVT prophylaxis: Lovenox SQ Code Status: Full code  Family Communication:  Level of care: Med-Surg Dispo:   The patient is from: home Anticipated d/c is to: SNF rehab Anticipated d/c date is: undetermined   Subjective and Interval History:  Pt reported feeling ok today.  CT c/a/p yesterday had multiple significant vascular findings, vascular surgery consulted.  Also showed a 9 mm renal nodule, oncology consulted.     Objective: Vitals:   12/12/21 2010 12/13/21 0420 12/13/21 0755 12/13/21 1512  BP: 133/67 (!) 179/69 (!) 173/65 (!) 118/57  Pulse: 80 83 73 74  Resp: _1 Temp: 98.2 F (36.8 C) 97.6 F (36.4 C) (!) 97.5 F (36.4 C) 98.2 F (36.8 C)  TempSrc: Oral  Oral Oral  SpO2: 96% 97% 98% 97%  Weight:      Height:        Intake/Output Summary (Last 24 hours) at 12/13/2021 1630 Last data filed at 12/13/2021 1550 Gross per 24 hour  Intake 360 ml  Output 1870 ml  Net -1510 ml   Filed Weights   12/09/21 0952  Weight: 64.9 kg    Examination:   Constitutional: NAD,  AAOx3 HEENT: conjunctivae and lids normal, EOMI CV: No cyanosis.   RESP: normal respiratory effort, on RA Neuro: II - XII grossly intact.   Psych: Normal mood and affect.  Appropriate judgement and reason   Data Reviewed: I have personally reviewed labs and imaging studies  Time spent: 50 minutes  Enzo Bi, MD [facility] If 7PM-7AM, please contact night-coverage 12/13/2021, 4:30 PM

## 2021-12-13 NOTE — Progress Notes (Signed)
Neurology Progress Note  Patient ID: Shannon Chen is a 76 y.o. with PMHx of  has a past medical history of Arthritis, AV block, Mobitz II, CAD (coronary artery disease), Carotid artery disease ([place]), Essential hypertension, History of stress test, Hyperlipidemia, Hypertrophic cardiomyopathy ([place]), and Presence of permanent cardiac pacemaker.  Subjective: Feels like her strength is improving  Exam: Current vital signs: BP (!) 173/65 (BP Location: Right Arm)   Pulse 73   Temp (!) 97.5 F (36.4 C) (Oral)   Resp 14   Ht 5' 5.5" (1.664 m)   Wt 64.9 kg   SpO2 98%   BMI 23.43 kg/m  Vital signs in last 24 hours: Temp:  [97.5 F (36.4 C)-98.2 F (36.8 C)] 97.5 F (36.4 C) (10/27 0755) Pulse Rate:  [73-83] 73 (10/27 0755) Resp:  [12-19] 14 (10/27 0755) BP: (133-179)/(51-89) 173/65 (10/27 0755) SpO2:  [95 %-98 %] 98 % (10/27 0755)   Current Facility-Administered Medications:    acetaminophen (TYLENOL) tablet 650 mg, 650 mg, Oral, Q6H PRN, 650 mg at 12/11/21 0358 **OR** acetaminophen (TYLENOL) suppository 650 mg, 650 mg, Rectal, Q6H PRN, Jose Persia, MD   alum & mag hydroxide-simeth (MAALOX/MYLANTA) 200-200-20 MG/5ML suspension 15 mL, 15 mL, Oral, Q6H PRN, Enzo Bi, MD, 15 mL at 12/12/21 0939   amLODipine (NORVASC) tablet 2.5 mg, 2.5 mg, Oral, Daily, Jose Persia, MD, 2.5 mg at 12/12/21 1517   aspirin EC tablet 81 mg, 81 mg, Oral, Daily, Jose Persia, MD, 81 mg at 12/12/21 0856   calcium carbonate (OS-CAL - dosed in mg of elemental calcium) tablet 1,250 mg, 500 mg of elemental calcium, Oral, BID WC, Enzo Bi, MD   calcium carbonate (TUMS - dosed in mg elemental calcium) chewable tablet 200 mg of elemental calcium, 1 tablet, Oral, TID PRN, Enzo Bi, MD, 200 mg of elemental calcium at 12/12/21 1314   clopidogrel (PLAVIX) tablet 75 mg, 75 mg, Oral, Daily, Jose Persia, MD, 75 mg at 12/12/21 0856   cyanocobalamin (VITAMIN B12) tablet 1,000 mcg, 1,000 mcg, Oral, Daily, Alberta Lenhard,  Maudean Hoffmann L, MD, 1,000 mcg at 12/12/21 0856   docusate sodium (COLACE) capsule 100 mg, 100 mg, Oral, BID, Jose Persia, MD, 100 mg at 12/12/21 2238   enoxaparin (LOVENOX) injection 40 mg, 40 mg, Subcutaneous, Q24H, Beers, Shanon Brow, RPH, 40 mg at 12/12/21 2238   fluticasone furoate-vilanterol (BREO ELLIPTA) 200-25 MCG/ACT 1 puff, 1 puff, Inhalation, Daily, 1 puff at 12/12/21 0858 **AND** umeclidinium bromide (INCRUSE ELLIPTA) 62.5 MCG/ACT 1 puff, 1 puff, Inhalation, Daily, Jose Persia, MD, 1 puff at 61/60/73 7106   folic acid (FOLVITE) tablet 1 mg, 1 mg, Oral, Daily, Jose Persia, MD, 1 mg at 12/12/21 0856   guaiFENesin (ROBITUSSIN) 100 MG/5ML liquid 5 mL, 5 mL, Oral, Q4H PRN, Amin, Ankit Chirag, MD   hydrALAZINE (APRESOLINE) injection 10 mg, 10 mg, Intravenous, Q4H PRN, Amin, Ankit Chirag, MD, 10 mg at 12/12/21 0507   insulin aspart (novoLOG) injection 0-6 Units, 0-6 Units, Subcutaneous, TID WC, Jaysin Gayler L, MD, 1 Units at 12/11/21 1616   ipratropium-albuterol (DUONEB) 0.5-2.5 (3) MG/3ML nebulizer solution 3 mL, 3 mL, Nebulization, Q4H PRN, Amin, Ankit Chirag, MD   meclizine (ANTIVERT) tablet 12.5 mg, 12.5 mg, Oral, BID PRN, Amin, Ankit Chirag, MD   metoprolol succinate (TOPROL-XL) 24 hr tablet 50 mg, 50 mg, Oral, Daily, Jose Persia, MD, 50 mg at 12/12/21 0856   metoprolol tartrate (LOPRESSOR) injection 5 mg, 5 mg, Intravenous, Q4H PRN, Amin, Jeanella Flattery, MD   mirabegron ER (  MYRBETRIQ) tablet 25 mg, 25 mg, Oral, Daily, Jose Persia, MD, 25 mg at 12/12/21 0857   mirtazapine (REMERON) tablet 7.5 mg, 7.5 mg, Oral, QHS, Jose Persia, MD, 7.5 mg at 12/12/21 2238   multivitamin with minerals tablet 1 tablet, 1 tablet, Oral, Daily, Jose Persia, MD, 1 tablet at 12/12/21 0856   pantoprazole (PROTONIX) EC tablet 40 mg, 40 mg, Oral, Daily, Enzo Bi, MD   polyethylene glycol (MIRALAX / GLYCOLAX) packet 17 g, 17 g, Oral, Daily PRN, Jose Persia, MD   predniSONE (DELTASONE)  tablet 65 mg, 65 mg, Oral, Q breakfast, Jose Persia, MD, 65 mg at 12/12/21 0856   sodium chloride flush (NS) 0.9 % injection 3 mL, 3 mL, Intravenous, Q12H, Jose Persia, MD, 3 mL at 12/12/21 2243   thiamine (VITAMIN B1) tablet 100 mg, 100 mg, Oral, Daily, Jose Persia, MD, 100 mg at 12/12/21 0856   traZODone (DESYREL) tablet 50 mg, 50 mg, Oral, QHS PRN, Amin, Ankit Chirag, MD   Vitamin D (Ergocalciferol) (DRISDOL) 1.25 MG (50000 UNIT) capsule 50,000 Units, 50,000 Units, Oral, Q7 days, Krissie Merrick L, MD, 50,000 Units at 12/11/21 1607    Gen: In bed, comfortable  Resp: non-labored breathing, no grossly audible wheezing Cardiac: Perfusing extremities well  Abd: soft, nt Extremities: Stable bruising on her arms  Neuro: MS: Awake, alert, oriented to time, person, place, situation CN: Face symmetric, tracking examiner Motor: stable to slightly improved from yesterday 4/5 deltoids otherwise 5/5 in the UE, 2-3/5 bilateral hip flexion (briefly lifts both legs slightly off the bed today), 5/5 knee extension, 4/5 knee flexion (a slightly weaker on the left than the right), 5/5 foot plantar and dorsiflexion on the right, 5/5 foot plantarflexion on the left with 4/5 foot dorsiflexion on the left Sensory: Remains intact DTR: 3+ and symmetric in the biceps / brachioradialis, 2+ symmetric patellar Gait: Requires 2 person assist to sit at the side of the bed.  Takes multiple attempts to rise from the side of the bed.    Pertinent Labs:   Basic Metabolic Panel: Recent Labs  Lab 12/09/21 1049 12/10/21 0348 12/11/21 0407 12/12/21 0528 12/13/21 0436  NA 142 141 139 141 142  K 4.8 4.3 3.9 3.8 3.7  CL 115* 116* 112* 113* 111  CO2 20* 19* 20* 22 22  GLUCOSE 100* 83 95 90 111*  BUN 40* 34* 34* 37* 47*  CREATININE 2.08* 1.61* 1.35* 1.19* 1.47*  CALCIUM 9.0 8.3* 9.2 8.8* 9.0  MG  --   --  1.8 1.9 2.0     CBC: Recent Labs  Lab 12/09/21 1049 12/10/21 0348 12/11/21 0407  12/12/21 0528 12/13/21 0436  WBC 5.3 4.8 10.3 11.4* 13.3*  NEUTROABS  --  2.7  --   --   --   HGB 12.1 11.1* 11.0* 11.1* 11.2*  HCT 38.8 35.7* 34.0* 34.1* 34.7*  MCV 85.5 84.6 82.3 83.6 84.4  PLT 188 176 184 182 197     Coagulation Studies: No results for input(s): "LABPROT", "INR" in the last 72 hours.     Latest Reference Range & Units 12/10/21 15:40  Aldolase 3.3 - 10.3 U/L 26.7 (H)  (H): Data is abnormally high   Latest Reference Range & Units 12/10/21 15:40  Vitamin D, 25-Hydroxy 30 - 100 ng/mL 15.65 (L)  (L): Data is abnormally low    Latest Reference Range & Units 01/02/21 03:56 12/09/21 22:11 12/10/21 03:48 12/10/21 09:20 12/11/21 04:07 12/13/21 04:36  CK Total 38 - 234 U/L 58 6,906 (  H) 6,635 (H) 6,079 (H) 4,647 (H) 2,301 (H)  (H): Data is abnormally high  Lab Results  Component Value Date   ALT 95 (H) 12/13/2021   AST 56 (H) 12/13/2021   GGT 24 12/11/2021   ALKPHOS 79 12/13/2021   BILITOT 0.8 12/13/2021   ESR minimally elevated at 37 CRP 0.5 TSH normal at 1.225 A1c 6.4% RPR negative B12 low normal at 339 with MMA elevated at 514 Anti-Jo-1 negative Myoglobin, serum 4,189  CTA chest/abdomen/pelvis 1. Interval development of a penetrating atherosclerotic ulcer within the distal descending thoracic aorta measuring 9 mm in diameter and 5 mm in depth. No periaortic hematoma seen. 2. Stable size of a broad-based penetrating atherosclerotic ulcer involving the left lateral infrarenal abdominal aorta measuring 21 mm in diameter and 7 mm in depth. 3. 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 4. 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 5. Extensive multi-vessel coronary artery calcification. 6. 9 mm hypoenhancing cortical nodule within the interpolar region of the left kidney demonstrating interval increase in size  since prior examination and suspicious for a solid renal neoplasm. This could be further assessed with dedicated renal mass protocol CT or MRI examination if clinically indicated. 7. Suspected hemodynamically significant stenoses involving the celiac axis and superior mesenteric artery origins as well as the renal ostia bilaterally, not well characterized on this examination. Clinical correlation for signs and symptoms of hemodynamically significant renal artery stenosis or chronic mesenteric ischemia is recommended. If present, this would be better assessed with CT arteriography. 8. Stable marked extrahepatic biliary ductal dilation. A distal obstructing process, such as a biliary stricture or ampullary stenosis, is not excluded. Correlation with liver enzymes would be helpful for further evaluation. If abnormal, this could be further assessed with ERCP or MRCP examination.  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 Vitamin D deficiency can play a role in proximal toxic/metabolic myopathy although often with CK levels being normal and with more severe deficiency than this patient has.  Vitamin D deficiency can also play a role in risk for inflammatory myopathies. Low B12 can also be an exacerbating factor. Favor leukocytosis is secondary to steroid treatment.   Fortunately patient has stabilized rapidly, and is even improving in her strength.  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 and should be further worked up  Recommendations: -Continue 65 mg daily prednisone for  4-6 weeks (until rheumatology follow-up), taper per outpatient provider and pending clinical course and workup   -Vitamin D already ordered, additionally calcium supplementation while on steroids, PPI, to be ordered by primary team -outpatient rheumatologist/neurologist to consider pneumocystis jiroveci prophylaxis if high dose steroids will be continued longer term -Pending myositis panel, anti-HMG CoA reductase antibody -Vitamin D repletion, 50,000 units weekly for 8 weeks, further repletion/monitoring per primary team/PCP -Continue B12 1000 mcg oral, goal B12 greater than 600 -GGT normal, confirming that elevated AST/ALT are secondary to muscle injury and not hepatic injury -Continue to hold statin, would not restart until anti-HMG-CoA results and pending other workup -Glucose checks given patient is receiving steroids and has an  elevated A1c at 6.4%; appreciate primary team adjustment for prandial hyperglycemia secondary to steroids -In discussion with oncology will additionally pursue muscle biopsy while here, recommend deltoid muscle (either left or right) -Outpatient can consider EMG/NCS  -Renal mass protocol CT when able, noting patient's worsening renal function today and need for potential angiogram with vascular surgery -Outpatient recommend patient is up-to-date on colonoscopy and mammogram to complete malignancy screening -Neurology will continue to follow  [place] [phone]  Greater than 50 minutes were spent in care of this patient today, the majority in direct patient care

## 2021-12-13 NOTE — Consult Note (Signed)
@LOGO @   MRN :  Shannon Chen is a 76 y.o. (12-Jun-1945) female who presents with chief complaint of check circulation.  History of Present Illness:   I am asked to evaluate the patient by Dr. 06/10/1945.  Patient is a 76 year old woman known to our service status post carotid endarterectomy years ago.  She presented to [facility] approximately 4 days ago with fairly abrupt onset of weakness of the lower extremities and loss of sensation.  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  Diffuse atherosclerotic changes were also mentioned.  Patient denies unusual abdominal pain or unusual back pain, no other abdominal complaints.  No history of an abrupt onset of a painful toe associated with blue discoloration.     No family history of AAA.   Patient denies amaurosis fugax or TIA symptoms. There is no history of claudication or rest pain symptoms of the lower extremities.  The patient denies angina or shortness of breath.  CT scan is reviewed by me and I concur with the left common iliac artery aneurysm is now 3 cm to 3.2 cm in diameter.  The right common iliac artery aneurysm is 2.7 cm.  The abdominal aorta is 25 mm in diameter.  Of significance when I compared the CT scan to previous scans dated January 2022 and November 2022 there has been rapid enlargement of the left common iliac artery aneurysm.  On the previous scans it was noted to be 2.0 cm and 2.4 cm respectively.  Current Meds  Medication Sig   amLODipine (NORVASC) 2.5 MG tablet Take 2.5 mg by mouth daily.   aspirin EC 81 MG EC tablet Take 1 tablet (81 mg total) by mouth daily.  Swallow whole.   clopidogrel (PLAVIX) 75 MG tablet Take 75 mg by mouth daily.   dapagliflozin propanediol (FARXIGA) 10 MG TABS tablet Take 10 mg by mouth daily.   docusate sodium (COLACE) 100 MG capsule Take 1 capsule (100 mg total) by mouth 2 (two) times daily.   KERENDIA 20 MG TABS Take 20 mg by mouth daily.   metoprolol succinate (TOPROL-XL) 50 MG 24 hr tablet Take 1 tablet (50 mg total) by mouth daily.   mirtazapine (REMERON) 7.5 MG tablet Take 7.5 mg by mouth at bedtime.   potassium chloride (KLOR-CON) 10 MEQ tablet Take 10 mEq by mouth daily.   rosuvastatin (CRESTOR) 40 MG tablet TAKE 1 TABLET BY MOUTH DAILY   TRELEGY ELLIPTA 200-62.5-25 MCG/ACT AEPB Inhale 1 puff into the lungs daily.   Vibegron (GEMTESA) 75 MG TABS Take 75 mg by mouth daily.    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 09/2016 s/p MDT 10/2016 Azure XT DR MRI DC PPM   CAD (coronary artery disease)    a. 11/2020 NSTEMI/Cath: LM mild diff dzs, LAD, diff dzs, LCX 30d, RCA Ca2+ w/ diff dzs throughout, 5m. FFR cath not able to advance-->Med Rx.   Carotid artery disease ([name])    a. 09/2016 Carotid U/S: RICA 70-99%, LICA 50-69%;  b. 09/2016 CTA Neck: RICA 60, RCCA  30, LICA 70%, L Vert 60, L Basilar 70; c. 10/2019 s/p R CEA.   Essential hypertension    History of stress test    a. 12/2016 MV: EF 51%, no ischemia/infarct; b. 10/2019 MV: EF 61%, no ischemia/infarct.   Hyperlipidemia    Hypertrophic cardiomyopathy ([name])    a. 09/2016 Echo: EF 60-65%, no rwma, Gr1 DD, Ca2+ MV annulus; b. 11/2017 Echo: EF 70-75%, no rwma, Gr1 DD, mild AS vs dynamic LVOT obs. Nl RV fxn. Hyperdynamic LVEF w/ sev LVH; c. 10/2019 Echo: EF 70-75%, no rwma, gr1 DD, sev LVH, Gr1 DD, nl PASP, triv MR, mild-mod Ao sclerosis; c. 05/2020 Echo: EF 60-65%; e. 11/2020 Echo: EF 60-65%, sev LVH, nl RV fxn, Mild MR. Ao Sclerosis.   Presence of permanent cardiac pacemaker     Past Surgical History:  Procedure Laterality Date    ABDOMINAL HYSTERECTOMY     CORONARY STENT INTERVENTION N/A 12/31/2020   Procedure: CORONARY STENT INTERVENTION;  Surgeon: Iran OuchArida, Muhammad A, MD;  Location: [facility] INVASIVE CV LAB;  Service: Cardiovascular;  Laterality: N/A;   ENDARTERECTOMY Right 11/16/2019   Procedure: ENDARTERECTOMY CAROTID;  Surgeon: Renford DillsSchnier, Margeaux Swantek G, MD;  Location: [facility] ORS;  Service: Vascular;  Laterality: Right;   INTRAVASCULAR PRESSURE WIRE/FFR STUDY N/A 12/13/2020   Procedure: INTRAVASCULAR PRESSURE WIRE/FFR STUDY;  Surgeon: Marykay LexHarding, David W, MD;  Location: [facility] INV[facility] CV [facility];  Service: Cardiovascular;  Laterality: N/A;   LEFT HEART CATH AND CORONARY ANGIOGRAPHY N/A 12/13/2020   Procedure: LEFT HEART CATH AND CORONARY ANGIOGRAPHY;  Surgeon: Marykay LexHarding, David W, MD;  Location: [facility] IN[facility] CV [facility];  Service: Cardiovascular;  Laterality: N/A;   LEFT HEART CATH AND CORONARY ANGIOGRAPHY N/A 12/31/2020   Procedure: LEFT HEART CATH AND CORONARY ANGIOGRAPHY;  Surgeon: Iran OuchArida, Muhammad A, MD;  Location: [facility] INV[facility]IVE CV LAB;  Service: Cardiovascular;  Laterality: N/A;   PACEMAKER IMPLANT N/A 10/08/2016   Procedure: Pacemaker Implant;  Surgeon: Marinus Mawaylor, Gregg W, MD;  [facility];  Service: Cardiovascular;  Laterality: N/A;    Social History Social History   Tobacco Use   Smoking status: Former    Packs/day: 1.00    Years: 30.00    Total pack years: 30.00    Types: Cigarettes    Quit date: 11/09/2018    Years since quitting: 3.0   Smokeless tobacco: Never  Vaping Use   Vaping Use: Never used  Substance Use Topics   Alcohol use: No   Drug use: No    Family History Family History  Problem Relation Age of Onset   Diabetes Mother    CAD Father     Not on File   REVIEW OF SYSTEMS (Negative unless checked)  Constitutional: [] Weight loss  [] Fever  [] Chills Cardiac: [] Chest pain   [] Chest pressure   [] Palpitations   [] Shortness of breath when laying flat   [] Shortness of breath with  exertion. Vascular:  [x] Pain in legs with walking   [] Pain in legs at rest  [] History of DVT   [] Phlebitis   [] Swelling in legs   [] Varicose veins   [] Non-healing ulcers Pulmonary:   [] Uses home oxygen   [] Productive cough   [] Hemoptysis   [] Wheeze  [] COPD   [] Asthma Neurologic:  [] Dizziness   [] Seizures   [] History of stroke   [] History of TIA  [] Aphasia   [] Vissual changes   [] Weakness or numbness in arm   [x] Weakness or numbness in leg Musculoskeletal:   [] Joint swelling   [] Joint pain   [] Low back  pain Hematologic:  [] Easy bruising  [] Easy bleeding   [] Hypercoagulable state   [] Anemic Gastrointestinal:  [] Diarrhea   [] Vomiting  [] Gastroesophageal reflux/heartburn   [] Difficulty swallowing. Genitourinary:  [] Chronic kidney disease   [] Difficult urination  [] Frequent urination   [] Blood in urine Skin:  [] Rashes   [] Ulcers  Psychological:  [] History of anxiety   []  History of major depression.  Physical Examination  Vitals:   12/12/21 2010 12/13/21 0420 12/13/21 0755 12/13/21 1512  BP: 133/67 (!) 179/69 (!) 173/65 (!) 118/57  Pulse: 80 83 73 74  Resp: 18 19 14 17   Temp: 98.2 F (36.8 C) 97.6 F (36.4 C) (!) 97.5 F (36.4 C) 98.2 F (36.8 C)  TempSrc: Oral  Oral Oral  SpO2: 96% 97% 98% 97%  Weight:      Height:       Body mass index is 23.43 kg/m. Gen: WD/WN, NAD Head: [place]/AT, No temporalis wasting.  Ear/Nose/Throat: Hearing grossly intact, nares w/o erythema or drainage Eyes: PER, EOMI, sclera nonicteric.  Neck: Supple, no masses.  No bruit or JVD.  Pulmonary:  Good air movement, no audible wheezing, no use of accessory muscles.  Cardiac: RRR, normal S1, S2, no Murmurs. Vascular:  mild trophic changes, no open wounds Vessel Right Left  Radial Palpable Palpable  PT Not Palpable Not Palpable  DP Not Palpable Not Palpable  Gastrointestinal: soft, non-distended. No guarding/no peritoneal signs.  Musculoskeletal: M/S 5/5 throughout except the lower extremities which are 3 out of  5.  No visible deformity.  Neurologic: CN 2-12 intact. Pain and light touch intact in extremities.  Symmetrical.  Speech is fluent. Motor exam as listed above. Psychiatric: Judgment intact, Mood & affect appropriate for pt's clinical situation. Dermatologic: No rashes or ulcers noted.  No changes consistent with cellulitis.   CBC Lab Results  Component Value Date   WBC 13.3 (H) 12/13/2021   HGB 11.2 (L) 12/13/2021   HCT 34.7 (L) 12/13/2021   MCV 84.4 12/13/2021   PLT 197 12/13/2021    BMET    Component Value Date/Time   NA 142 12/13/2021 0436   K 3.7 12/13/2021 0436   CL 111 12/13/2021 0436   CO2 22 12/13/2021 0436   GLUCOSE 111 (H) 12/13/2021 0436   BUN 47 (H) 12/13/2021 0436   CREATININE 1.47 (H) 12/13/2021 0436   CALCIUM 9.0 12/13/2021 0436   GFRNONAA 36 (L) 12/13/2021 0436   GFRAA >60 11/18/2019 0619   Estimated Creatinine Clearance: 29 mL/min (A) (by C-G formula based on SCr of 1.47 mg/dL (H)).  COAG Lab Results  Component Value Date   INR 1.1 12/29/2020   INR 1.0 12/28/2020   INR 1.1 12/12/2020    Radiology CT CHEST ABDOMEN PELVIS W CONTRAST  Result Date: 12/12/2021 CLINICAL DATA:  Occult malignancy 12/15/2021 Myopathy [id number] EXAM: CT CHEST, ABDOMEN, AND PELVIS WITH CONTRAST TECHNIQUE: Multidetector CT imaging of the chest, abdomen and pelvis was performed following the standard protocol during bolus administration of intravenous contrast. RADIATION DOSE REDUCTION: This exam was performed according to the departmental dose-optimization program which includes automated exposure control, adjustment of the mA and/or kV according to patient size and/or use of iterative reconstruction technique. CONTRAST:  12/15/2021 OMNIPAQUE IOHEXOL 300 MG/ML  SOLN COMPARISON:  12/28/2020 FINDINGS: CT CHEST FINDINGS Cardiovascular: Extensive multi-vessel coronary artery calcification. Calcification of the aortic valve leaflets noted. Global cardiac size within normal limits. Left subclavian dual  lead pacemaker in place with leads within the right atrium and right ventricle. No pericardial effusion.  Central pulmonary arteries are of normal caliber. Extensive atherosclerotic calcification within the thoracic aorta. No aortic aneurysm. Interval development of a penetrating atherosclerotic ulcer within the distal descending thoracic aorta best seen on axial image # 38/2 and coronal image # 89/5 measuring 9 mm in diameter and 5 mm in depth. No periaortic hematoma seen. Mediastinum/Nodes: Visualized thyroid is unremarkable. No pathologic thoracic adenopathy. Esophagus unremarkable. Lungs/Pleura: Mild left basilar dependent atelectasis. Lungs are otherwise clear. No pneumothorax or pleural effusion. Central airways are widely patent. Musculoskeletal: No chest wall mass or suspicious bone lesions identified. CT ABDOMEN PELVIS FINDINGS Hepatobiliary: The gallbladder is unremarkable. There is mild intrahepatic and marked extrahepatic biliary ductal dilation with the extrahepatic bile duct measuring up to 16 mm in diameter, stable since prior examination. A distal obstructing process, such as a biliary stricture or ampullary stenosis, is not excluded. No enhancing intrahepatic mass identified. Pancreas: Unremarkable Spleen: Unremarkable Adrenals/Urinary Tract: The adrenal glands are unremarkable. The kidneys are normal in size and position. 9 mm hypoenhancing cortical nodule is seen within the interpolar region of the left kidney which demonstrates interval increase in size since prior examination where this measured approximately 7 mm and is suspicious for a solid renal neoplasm. No hydronephrosis. No intrarenal or ureteral calculi. The bladder is unremarkable. Stomach/Bowel: Moderate colonic stool burden without evidence of obstruction. Mild sigmoid diverticulosis without superimposed acute inflammatory change. Stomach, small bowel, and large bowel are otherwise unremarkable. Appendix normal. No free intraperitoneal  gas or fluid. Vascular/Lymphatic: Extensive aortoiliac atherosclerotic calcification. Suspected hemodynamically significant stenoses involving the celiac axis and superior mesenteric artery origins as well as the renal ostia bilaterally, not well characterized on this examination. Broad-based penetrating atherosclerotic ulcer involving the left lateral infrarenal abdominal aorta appears stable in size measuring 18 x 21 mm with a depth of approximately 6 mm but demonstrates a small amount of increasing mural thrombus, best seen on axial image # 71/2 and coronal image # 61/5. 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 This is best measured on the current examination on coronal image # 69/5 and axial image # 86/2. Aneurysm demonstrating a limited focal dissection involving the right common iliac artery is unchanged measuring 2.0 cm in diameter. Fusiform aneurysm of the left common iliac artery is unchanged measuring 2.4 cm in diameter. No pathologic adenopathy within the abdomen and pelvis. Reproductive: Status post hysterectomy. No adnexal masses. Other: Subcutaneous gas within the left lower quadrant abdominal wall likely relates to subcutaneous injection. No abdominal wall hernia. No abdominopelvic ascites. Musculoskeletal: Degenerative changes are seen within the lumbar spine. No acute bone abnormality. No lytic or blastic bone lesion. IMPRESSION: 1. Interval development of a penetrating atherosclerotic ulcer within the distal descending thoracic aorta measuring 9 mm in diameter and 5 mm in depth. No periaortic hematoma seen. 2. Stable size of a broad-based penetrating atherosclerotic ulcer involving the left lateral infrarenal abdominal aorta measuring 21 mm in diameter and 7 mm in depth. 3. 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 4. 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 5. Extensive multi-vessel coronary artery calcification. 6. 9 mm hypoenhancing cortical nodule within the interpolar region of the left kidney demonstrating interval increase in size since prior examination and suspicious for a solid renal neoplasm. This could be further assessed with dedicated renal mass protocol CT  or MRI examination if clinically indicated. 7. Suspected hemodynamically significant stenoses involving the celiac axis and superior mesenteric artery origins as well as the renal ostia bilaterally, not well characterized on this examination. Clinical correlation for signs and symptoms of hemodynamically significant renal artery stenosis or chronic mesenteric ischemia is recommended. If present, this would be better assessed with CT arteriography. 8. Stable marked extrahepatic biliary ductal dilation. A distal obstructing process, such as a biliary stricture or ampullary stenosis, is not excluded. Correlation with liver enzymes would be helpful for further evaluation. If abnormal, this could be further assessed with ERCP or MRCP examination. Aortic Atherosclerosis (ICD10-I70.0). Electronically Signed   By: Helyn Numbers M.D.   On: 12/12/2021 14:23   CT CERVICAL SPINE WO CONTRAST  Result Date: 12/09/2021 CLINICAL DATA:  Myelopathy, acute, cervical spine; Myelopathy, acute, lumbar spine; Myelopathy, acute, thoracic spine EXAM: CT CERVICAL, THORACIC, AND LUMBAR SPINE WITHOUT CONTRAST TECHNIQUE: Multidetector CT imaging of the cervical, thoracic and lumbar spine was performed without intravenous contrast. Multiplanar CT image reconstructions were also generated. RADIATION DOSE REDUCTION: This exam was performed according to the departmental dose-optimization program which includes automated exposure control, adjustment of the mA and/or kV according  to patient size and/or use of iterative reconstruction technique. COMPARISON:  CT abdomen pelvis 11/17/2017, CT abdomen pelvis 03/14/2020 FINDINGS: CT CERVICAL SPINE FINDINGS Alignment: Normal. Skull base and vertebrae: Multilevel mild-to-moderate degenerative changes spine most prominent at the C5 through C7 levels. Associated intervertebral disc space narrowing at these levels. No severe osseous central canal or neural foraminal stenosis. No acute fracture. No aggressive appearing focal osseous lesion or focal pathologic process. Soft tissues and spinal canal: No prevertebral fluid or swelling. No visible canal hematoma. Upper chest: Centrilobular emphysematous changes. Other: 1.2 cm hypodense lesion within the right thyroid gland. Not clinically significant; no follow-up imaging recommended (ref: J Am Coll Radiol. 2015 Feb;12(2): 143-50).Atherosclerotic plaque of the carotid arteries within the neck. CT THORACIC SPINE FINDINGS Alignment: Normal. Vertebrae: No severe osseous neural foraminal or central canal stenosis. No acute fracture or focal pathologic process. Paraspinal and other soft tissues: Negative. Disc levels: Developing intervertebral disc space vacuum phenomenon at a couple levels. Otherwise maintained. CT LUMBAR SPINE FINDINGS Segmentation: 5 lumbar type vertebrae. Alignment: Normal. Vertebrae: Mild multilevel degenerative changes spine. Diffusely decreased bone density. No severe osseous central canal or neural foraminal stenosis. No acute fracture or focal pathologic process. Paraspinal and other soft tissues: Negative. Disc levels: Intervertebral disc space vacuum phenomenon at the L5-S1 level. 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 Redemonstration of several  focal areas of distension of the abdominal aorta and with no aneurysm. Severe atherosclerotic plaque. Four-vessel coronary calcification. Partially visualized cardiac leads. IMPRESSION: 1. No acute displaced fracture or traumatic listhesis of the cervical, thoracic, lumbar spine. 2. No severe osseous neural foraminal or central canal stenosis of the cervical, thoracic, lumbar spine. 3. 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 4. 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 5. Aortic Atherosclerosis (ICD10-I70.0) - severe including four-vessel coronary calcification. 6.  Emphysema (ICD10-J43.9). Electronically Signed   By: Tish Frederickson M.D.   On:  12/09/2021 21:51   CT THORACIC SPINE WO CONTRAST  Result Date: 12/09/2021 CLINICAL DATA:  Myelopathy, acute, cervical spine; Myelopathy, acute, lumbar spine; Myelopathy, acute, thoracic spine EXAM: CT CERVICAL, THORACIC, AND LUMBAR SPINE WITHOUT CONTRAST TECHNIQUE: Multidetector CT imaging of the cervical, thoracic and lumbar spine was performed without intravenous contrast. Multiplanar CT image reconstructions were also generated. RADIATION DOSE REDUCTION: This exam was performed according to the departmental dose-optimization program which includes automated exposure control, adjustment of the mA and/or kV according to patient size and/or use of iterative reconstruction technique. COMPARISON:  CT abdomen pelvis 11/17/2017, CT abdomen pelvis 03/14/2020 FINDINGS: CT CERVICAL SPINE FINDINGS Alignment: Normal. Skull base and vertebrae: Multilevel mild-to-moderate degenerative changes spine most prominent at the C5 through C7 levels. Associated intervertebral disc space narrowing at these levels. No severe osseous central canal or neural foraminal stenosis. No acute fracture. No aggressive appearing focal  osseous lesion or focal pathologic process. Soft tissues and spinal canal: No prevertebral fluid or swelling. No visible canal hematoma. Upper chest: Centrilobular emphysematous changes. Other: 1.2 cm hypodense lesion within the right thyroid gland. Not clinically significant; no follow-up imaging recommended (ref: J Am Coll Radiol. 2015 Feb;12(2): 143-50).Atherosclerotic plaque of the carotid arteries within the neck. CT THORACIC SPINE FINDINGS Alignment: Normal. Vertebrae: No severe osseous neural foraminal or central canal stenosis. No acute fracture or focal pathologic process. Paraspinal and other soft tissues: Negative. Disc levels: Developing intervertebral disc space vacuum phenomenon at a couple levels. Otherwise maintained. CT LUMBAR SPINE FINDINGS Segmentation: 5 lumbar type vertebrae. Alignment: Normal. Vertebrae: Mild multilevel degenerative changes spine. Diffusely decreased bone density. No severe osseous central canal or neural foraminal stenosis. No acute fracture or focal pathologic process. Paraspinal and other soft tissues: Negative. Disc levels: Intervertebral disc space vacuum phenomenon at the L5-S1 level. 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 Redemonstration of several focal areas of distension of the abdominal aorta and with no aneurysm. Severe atherosclerotic plaque. Four-vessel coronary calcification. Partially visualized cardiac leads. IMPRESSION: 1. No acute displaced fracture or traumatic listhesis of the cervical, thoracic, lumbar spine. 2. No severe osseous neural foraminal or central canal stenosis of the cervical, thoracic, lumbar spine. 3. 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 4. Interval  increase in size of a aneurysmal left common iliac artery measuring up to 2.1 cm. 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 5. Aortic Atherosclerosis (ICD10-I70.0) - severe including four-vessel coronary calcification. 6.  Emphysema (ICD10-J43.9). Electronically Signed   By: Tish Frederickson M.D.   On: 12/09/2021 21:51   CT LUMBAR SPINE WO CONTRAST  Result Date: 12/09/2021 CLINICAL DATA:  Myelopathy, acute, cervical spine; Myelopathy, acute, lumbar spine; Myelopathy, acute, thoracic spine EXAM: CT CERVICAL, THORACIC, AND LUMBAR SPINE WITHOUT CONTRAST TECHNIQUE: Multidetector CT imaging of the cervical, thoracic and lumbar spine was performed without intravenous contrast. Multiplanar CT image reconstructions were also generated. RADIATION DOSE REDUCTION: This exam was performed according to the departmental dose-optimization program which includes automated exposure control, adjustment of the mA and/or kV according to patient size and/or use of iterative reconstruction technique. COMPARISON:  CT abdomen pelvis 11/17/2017, CT abdomen pelvis 03/14/2020 FINDINGS: CT CERVICAL SPINE FINDINGS Alignment: Normal. Skull base and vertebrae: Multilevel mild-to-moderate degenerative changes spine  most prominent at the C5 through C7 levels. Associated intervertebral disc space narrowing at these levels. No severe osseous central canal or neural foraminal stenosis. No acute fracture. No aggressive appearing focal osseous lesion or focal pathologic process. Soft tissues and spinal canal: No prevertebral fluid or swelling. No visible canal hematoma. Upper chest: Centrilobular emphysematous changes. Other: 1.2 cm hypodense lesion within the right thyroid gland. Not clinically significant; no follow-up imaging recommended (ref: J Am Coll Radiol. 2015 Feb;12(2): 143-50).Atherosclerotic plaque of the carotid arteries within the neck. CT THORACIC SPINE FINDINGS Alignment: Normal. Vertebrae: No severe  osseous neural foraminal or central canal stenosis. No acute fracture or focal pathologic process. Paraspinal and other soft tissues: Negative. Disc levels: Developing intervertebral disc space vacuum phenomenon at a couple levels. Otherwise maintained. CT LUMBAR SPINE FINDINGS Segmentation: 5 lumbar type vertebrae. Alignment: Normal. Vertebrae: Mild multilevel degenerative changes spine. Diffusely decreased bone density. No severe osseous central canal or neural foraminal stenosis. No acute fracture or focal pathologic process. Paraspinal and other soft tissues: Negative. Disc levels: Intervertebral disc space vacuum phenomenon at the L5-S1 level. 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 Redemonstration of several focal areas of distension of the abdominal aorta and with no aneurysm. Severe atherosclerotic plaque. Four-vessel coronary calcification. Partially visualized cardiac leads. IMPRESSION: 1. No acute displaced fracture or traumatic listhesis of the cervical, thoracic, lumbar spine. 2. No severe osseous neural foraminal or central canal stenosis of the cervical, thoracic, lumbar spine. 3. 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 4. 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 5. Aortic Atherosclerosis (ICD10-I70.0) - severe including four-vessel coronary calcification. 6.  Emphysema (ICD10-J43.9). Electronically Signed   By: Tish Frederickson M.D.   On: 12/09/2021 21:51   CT HEAD WO CONTRAST ( )  Result Date: 12/09/2021 CLINICAL DATA:  Hypertension, diabetes mellitus, atrial fibrillation, generalized  weakness EXAM: CT HEAD WITHOUT CONTRAST TECHNIQUE: Contiguous axial images were obtained from the base of the skull through the vertex without intravenous contrast. RADIATION DOSE REDUCTION: This exam was performed according to the departmental dose-optimization program which includes automated exposure control, adjustment of the mA and/or kV according to patient size and/or use of iterative reconstruction technique. COMPARISON:  01/06/2021 FINDINGS: Brain: Generalized atrophy. Normal ventricular morphology. No midline shift or mass effect. Small vessel chronic ischemic changes of deep cerebral white matter. BILATERAL old basal ganglia lacunar infarcts. Small old infarct RIGHT cerebellum. No intracranial hemorrhage, mass lesion, evidence of acute infarction, or extra-axial fluid collection. Vascular: Atherosclerotic calcifications of internal carotid arteries at skull base. No hyperdense vessels. Skull: Intact Sinuses/Orbits: Partial opacification of BILATERAL ethmoid air cells. Remaining paranasal sinuses and mastoid air cells clear Other: Significant ossification within falx. IMPRESSION: Atrophy with small vessel chronic ischemic changes of deep cerebral white matter. 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 No acute intracranial abnormalities. Electronically Signed   By: Ulyses Southward M.D.   On: 12/09/2021 18:31   DG Chest Portable 1 View  Result Date: 12/09/2021 CLINICAL DATA:  Short of breath, hypertension, generalized weakness EXAM: PORTABLE CHEST 1 VIEW COMPARISON:  12/28/2020 FINDINGS: Single frontal view of the chest  demonstrates a stable cardiac silhouette. Dual lead pacemaker again noted. Stable atherosclerosis of the thoracic aorta. No acute airspace disease, effusion, or pneumothorax. No acute bony abnormality. IMPRESSION: 1. Stable chest, no acute process. Electronically Signed   By: Sharlet Salina M.D.   On: 12/09/2021 18:14     Assessment/Plan 1.  Bilateral iliac artery  aneurysms: Given the size of the common iliac artery aneurysm as well as the rapid expansion it is recommended that she undergo repair.  By my evaluation she is a good candidate for endovascular stent graft placement.  This was discussed with her.  Risks and benefits were reviewed.  She agrees to proceed.  We will move forward with work-up including cardiac clearance and discussion with anesthesia and hopefully will be able to repair the aneurysms next week.  2.  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  She has been initiated on parenteral steroid therapy and is already noting some improvement.  We will continue to monitor this and as long as she is improving we can move forward with stent graft placement.  3.  Carotid artery stenosis: She is status post successful repair no further treatment indicated at this time.  4.  Hypertension: Continue antihypertensive medications as already ordered, these medications have been reviewed and there are no changes at this time.   5.  Diabetes: Continue hypoglycemic medications as already ordered, these medications have been reviewed and there are no changes at this time.  Hgb A1C to be monitored as already arranged by primary service.  6.  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  This will be addressed down the road.  Levora Dredge, MD  12/13/2021 5:35 PM

## 2021-12-13 NOTE — Plan of Care (Signed)

## 2021-12-13 NOTE — Consult Note (Signed)
Cardiology Consultation   Patient ID: Shannon Chen MRN: [id number]; DOB: 05/05/1945  Admit date: 12/09/2021 Date of Consult: 12/13/2021  PCP:  Remi Haggard, [facility] Providers Cardiologist:  Ida Rogue, MD  Electrophysiologist:  Virl Axe, MD  {  Patient Profile:   Shannon Chen is a 76 y.o. female with a hx of CAD, labile hypertension, hokum, severe LVH, syncope and complete heart block status post PPM in 09/2016, carotid artery disease status post right CEA, and remote tobacco abuse who is being seen 12/13/2021 for the evaluation of preoperative cardiac evaluation at the request of Dr. Billie Ruddy.  History of Present Illness:   Shannon Chen lives at a skilled nursing facility.  In April 2022, she was admitted to [facility] regional with generalized weakness.  High-sensitivity troponin was elevated at 204.  Echo showed an EF of 60 to 65% with severe concentric LVH and grade 1 diastolic dysfunction.  Conservative therapy was recommended.  Patient was admitted to [facility] regional in late October 2022 with presyncope and diaphoresis.  Troponin was elevated to 4062.  Echo showed EF of 60 to 65% with severe LVH, mild MR, and mild to moderate aortic sclerosis.  She was treated with IV heparin for 48 hours and underwent diagnostic catheterization which revealed severely calcified and tortuous right coronary artery with a 70% stenosis.  The interventional team was unable to pass an FFR catheter.  The vessel was felt likely challenging for PCI given calcified, small, tortuous nature, and medical therapy was recommended.  Recommended complex PCI for refractory angina.  Patient was admitted in November 2022 for altered mental status.  Troponin was elevated and she was started on IV heparin.  Patient denied chest pain.  Admission complicated by AKI, lactic acidosis, tachycardia and hypotension.  Echo showed LVEF 60 to [id number], grade 1 diastolic dysfunction.  Patient underwent  repeat catheterization showing stable coronary findings from prior cath.  Attempted PCI to tortuous and heavily calcified RCA was unsuccessful due to inability to cross the lesion with a wire.  Medical therapy was recommended.  Metoprolol was increased for better heart rate control.  Plan to continue DAPT with aspirin and Plavix for 12 months.  Last seen by Dr. Caryl Comes 11/12/2021 and was overall stable from a cardiac perspective.  Patient was admitted 12/09/2025 for lower extremity weakness and difficulty walking 5 days prior to admission. Said she could walk, before she was walking normally. Denies falls. In the ED she was found to have rhabdomyolysis and AKI. CK elevated to >6000.  CT of the head, cervical thoracic and lumbar spine was nonacute. started on prednisone given IV fluids.  CT chest/abdomen/pelvis for malignancy screening showed vascular disease, (see reports below) CAD, left kidney nodule. Patient is undergoing work-up for RCC which could triggering polymyositis.She is unable to get MRI due to [facility]. Neurology and oncology are following. Cardiology was consulted for possible procedure/anesthesia.  Patient denies chest pain or shortness of breath. No lower leg edema. Patient is not very functional at baseline. Patient reports muscle strength is improving.  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 09/2016 s/p MDT [id number] Azure XT DR MRI DC PPM   CAD (coronary artery disease)    a. 11/2020 NSTEMI/Cath: LM mild diff dzs, LAD, diff dzs, LCX 30d, RCA Ca2+ w/ diff dzs throughout, 89m. FFR cath not able to advance-->Med Rx.   Carotid artery disease ([place])  a. 09/2016 Carotid U/S: RICA [id number], LICA [id number];  b. [id number] CTA Neck: RICA 60, RCCA 30, LICA [id number], L Vert 60, L Basilar 70; c. 10/2019 s/p R CEA.   Essential hypertension    History of stress test    a. 12/2016 MV: EF 51%, no ischemia/infarct; b. 10/2019 MV: EF 61%, no ischemia/infarct.   Hyperlipidemia     Hypertrophic cardiomyopathy ([place])    a. 09/2016 Echo: EF 60-65%, no rwma, Gr1 DD, Ca2+ MV annulus; b. 11/2017 Echo: EF 70-75%, no rwma, Gr1 DD, mild AS vs dynamic LVOT obs. Nl RV fxn. Hyperdynamic LVEF w/ sev LVH; c. 10/2019 Echo: EF 70-75%, no rwma, gr1 DD, sev LVH, Gr1 DD, nl PASP, triv MR, mild-mod Ao sclerosis; c. 05/2020 Echo: EF 60-65%; e. 11/2020 Echo: EF 60-65%, sev LVH, nl RV fxn, Mild MR. Ao Sclerosis.   Presence of permanent cardiac pacemaker     Past Surgical History:  Procedure Laterality Date   ABDOMINAL HYSTERECTOMY     CORONARY STENT INTERVENTION N/A 12/31/2020   Procedure: CORONARY STENT INTERVENTION;  Surgeon: Wellington Hampshire, MD;  Location: [place] [facility];  Service: Cardiovascular;  Laterality: N/A;   ENDARTERECTOMY Right 11/16/2019   Procedure: ENDARTERECTOMY CAROTID;  Surgeon: Katha Cabal, MD;  Location: [facility];  Service: Vascular;  Laterality: Right;   INTRAVASCULAR PRESSURE WIRE/FFR STUDY N/A 12/13/2020   Procedure: INTRAVASCULAR PRESSURE WIRE/FFR STUDY;  Surgeon: Leonie Man, MD;  Location: [place] [facility];  Service: Cardiovascular;  Laterality: N/A;   LEFT HEART CATH AND CORONARY ANGIOGRAPHY N/A 12/13/2020   Procedure: LEFT HEART CATH AND CORONARY ANGIOGRAPHY;  Surgeon: Leonie Man, MD;  Location: [place] [facility];  Service: Cardiovascular;  Laterality: N/A;   LEFT HEART CATH AND CORONARY ANGIOGRAPHY N/A 12/31/2020   Procedure: LEFT HEART CATH AND CORONARY ANGIOGRAPHY;  Surgeon: Wellington Hampshire, MD;  Location: [place] [facility];  Service: Cardiovascular;  Laterality: N/A;   PACEMAKER IMPLANT N/A 10/08/2016   Procedure: Pacemaker Implant;  Surgeon: Evans Lance, MD;  Location: [place] [facility];  Service: Cardiovascular;  Laterality: N/A;     Home Medications:  Prior to Admission medications   Medication Sig Start Date End Date Taking? Authorizing Provider  amLODipine (NORVASC) 2.5 MG tablet Take 2.5 mg by mouth daily. 01/08/21   Yes Minna Merritts, MD  aspirin EC 81 MG EC tablet Take 1 tablet (81 mg total) by mouth daily. Swallow whole. 11/09/19  Yes Wouk, Ailene Rud, MD  clopidogrel (PLAVIX) 75 MG tablet Take 75 mg by mouth daily. 03/29/21  Yes [provider]  dapagliflozin propanediol (FARXIGA) 10 MG TABS tablet Take 10 mg by mouth daily.   Yes [provider]  docusate sodium (COLACE) 100 MG capsule Take 1 capsule (100 mg total) by mouth 2 (two) times daily. 01/04/21 01/04/22 Yes Lavina Hamman, MD  KERENDIA 20 MG TABS Take 20 mg by mouth daily. 12/04/21  Yes [provider]  metoprolol succinate (TOPROL-XL) 50 MG 24 hr tablet Take 1 tablet (50 mg total) by mouth daily. 12/16/20 12/09/21 Yes Val Riles, MD  mirtazapine (REMERON) 7.5 MG tablet Take 7.5 mg by mouth at bedtime. 12/04/20  Yes [provider]  potassium chloride (KLOR-CON) 10 MEQ tablet Take 10 mEq by mouth daily. 03/29/21  Yes [provider]  rosuvastatin (CRESTOR) 40 MG tablet TAKE 1 TABLET BY MOUTH DAILY 12/04/20  Yes Minna Merritts, MD  TRELEGY ELLIPTA 200-62.5-25 MCG/ACT AEPB Inhale 1 puff  into the lungs daily. 12/07/20  Yes [provider]  Vibegron (GEMTESA) 75 MG TABS Take 75 mg by mouth daily.   Yes [provider]  acetaminophen (TYLENOL) 325 MG tablet Take 650 mg by mouth every 4 (four) hours as needed for mild pain or moderate pain.    [provider]  albuterol (VENTOLIN HFA) 108 (90 Base) MCG/ACT inhaler Inhale 1-2 puffs into the lungs every 6 (six) hours as needed for wheezing or shortness of breath. 11/13/20   [provider]  butalbital-acetaminophen-caffeine (FIORICET) 50-325-40 MG tablet Take 1 tablet by mouth every 4 (four) hours as needed for headache. 01/04/21   Lavina Hamman, MD  loperamide (IMODIUM) 2 MG capsule Take 2 mg by mouth as needed. 04/17/21   [provider]  meclizine (ANTIVERT) 12.5 MG tablet Take 0.5 mg by mouth as needed for  dizziness.    [provider]  mirtazapine (REMERON) 15 MG tablet Take by mouth. Patient not taking: Reported on 12/09/2021 05/28/21   [provider]  polyethylene glycol (MIRALAX) 17 g packet Take 17 g by mouth daily as needed for mild constipation. 11/28/19   Loel Dubonnet, NP  prochlorperazine (COMPAZINE) 10 MG tablet Take 1 tablet (10 mg total) by mouth every 6 (six) hours as needed for nausea or vomiting (or headache). 01/06/21   Blake Divine, MD    Inpatient Medications: Scheduled Meds:  amLODipine  2.5 mg Oral Daily   aspirin EC  81 mg Oral Daily   calcium carbonate  500 mg of elemental calcium Oral BID WC   clopidogrel  75 mg Oral Daily   vitamin B-12  1,000 mcg Oral Daily   docusate sodium  100 mg Oral BID   enoxaparin (LOVENOX) injection  40 mg Subcutaneous Q24H   fluticasone furoate-vilanterol  1 puff Inhalation Daily   And   umeclidinium bromide  1 puff Inhalation Daily   folic acid  1 mg Oral Daily   insulin aspart  0-6 Units Subcutaneous TID WC   metoprolol succinate  50 mg Oral Daily   mirabegron ER  25 mg Oral Daily   mirtazapine  7.5 mg Oral QHS   multivitamin with minerals  1 tablet Oral Daily   pantoprazole  40 mg Oral Daily   predniSONE  65 mg Oral Q breakfast   sodium chloride flush  3 mL Intravenous Q12H   thiamine  100 mg Oral Daily   Vitamin D (Ergocalciferol)  50,000 Units Oral Q7 days   Continuous Infusions:  PRN Meds: acetaminophen **OR** acetaminophen, alum & mag hydroxide-simeth, calcium carbonate, guaiFENesin, hydrALAZINE, ipratropium-albuterol, meclizine, metoprolol tartrate, polyethylene glycol, traZODone  Allergies:   Not on File  Social History:   Social History   Socioeconomic History   Marital status: Single    Spouse name: Not on file   Number of children: Not on file   Years of education: Not on file   Highest education level: Not on file  Occupational History   Not on file  Tobacco Use   Smoking status:  Former    Packs/day: 1.00    Years: 30.00    Total pack years: 30.00    Types: Cigarettes    Quit date: 11/09/2018    Years since quitting: 3.0   Smokeless tobacco: Never  Vaping Use   Vaping Use: Never used  Substance and Sexual Activity   Alcohol use: No   Drug use: No   Sexual activity: Not on file  Other Topics  Concern   Not on file  Social History Narrative   ** Merged History Encounter **       Independent at baseline Ambulates without any assistance. Lives by herself   Social Determinants of Health   Financial Resource Strain: Not on file  Food Insecurity: No Food Insecurity (12/10/2021)   Hunger Vital Sign    Worried About Running Out of Food in the Last Year: Never true    Ran Out of Food in the Last Year: Never true  Transportation Needs: No Transportation Needs (12/10/2021)   PRAPARE - Hydrologist (Medical): No    Lack of Transportation (Non-Medical): No  Physical Activity: Not on file  Stress: Not on file  Social Connections: Not on file  Intimate Partner Violence: Not At Risk (12/10/2021)   Humiliation, Afraid, Rape, and Kick questionnaire    Fear of Current or Ex-Partner: No    Emotionally Abused: No    Physically Abused: No    Sexually Abused: No    Family History:    Family History  Problem Relation Age of Onset   Diabetes Mother    CAD Father      ROS:  Please see the history of present illness.   All other ROS reviewed and negative.     Physical Exam/Data:   Vitals:   12/12/21 2010 12/13/21 0420 12/13/21 0755 12/13/21 1512  BP: 133/67 (!) 179/69 (!) 173/65 (!) 118/57  Pulse: 80 83 73 74  Resp: 18 19 14 17   Temp: 98.2 F (36.8 C) 97.6 F (36.4 C) (!) 97.5 F (36.4 C) 98.2 F (36.8 C)  TempSrc: Oral  Oral Oral  SpO2: 96% 97% 98% 97%  Weight:      Height:        Intake/Output Summary (Last 24 hours) at 12/13/2021 1515 Last data filed at 12/13/2021 1408 Gross per 24 hour  Intake 360 ml  Output 1770  ml  Net -1410 ml      12/09/2021    9:52 AM 11/12/2021   11:15 AM 08/26/2021   11:20 AM  Last 3 Weights  Weight (lbs) 143 lb 144 lb 2 oz 149 lb 6.4 oz  Weight (kg) 64.864 kg 65.375 kg 67.767 kg     Body mass index is 23.43 kg/m.  General:  Well nourished, well developed, in no acute distress HEENT: normal Neck: no JVD Vascular: No carotid bruits; Distal pulses 2+ bilaterally Cardiac:  normal S1, S2; RRR; no murmur  Lungs:  clear to auscultation bilaterally, no wheezing, rhonchi or rales  Abd: soft, nontender, no hepatomegaly  Ext: no edema Musculoskeletal:  No deformities, BUE and BLE strength normal and equal Skin: warm and dry  Neuro:  CNs 2-12 intact, no focal abnormalities noted Psych:  Normal affect   EKG:  The EKG was personally reviewed and demonstrates:  NSR 64 bpm, RBBB, diffuse T wave changes Telemetry:  Telemetry was personally reviewed and demonstrates:  N/A  Relevant CV Studies:  Cardiac cath 12/2020     Dist Cx lesion is 30% stenosed.   1st Mrg lesion is 60% stenosed.   Prox LAD lesion is 50% stenosed.   Prox RCA lesion is 60% stenosed.   Mid RCA lesion is 90% stenosed.   1.  Severe one-vessel coronary artery disease involving the mid right coronary artery.  The right coronary artery is severely tortuous and calcified.  No significant change since recent cardiac catheterization. 2.  Left ventricular angiography was not performed.  EF was normal by echo.  Mildly elevated left ventricular end-diastolic pressure. 3.  Attempted unsuccessful angioplasty of the right coronary artery due to inability to cross the stenosis with a wire in spite of multiple attempts.   Recommendations: No options for PCI of the right coronary artery.  Recommend continuing medical therapy as is being done and maximizing antianginal medications.  Echo 12/2020 1. Left ventricular ejection fraction, by estimation, is 60 to 65%. The  left ventricle has normal function. The left ventricle  has no regional  wall motion abnormalities. There is severe left ventricular hypertrophy of  the basal-septal segment. Left  ventricular diastolic parameters are consistent with Grade I diastolic  dysfunction (impaired relaxation). Elevated left ventricular end-diastolic  pressure.   2. Right ventricular systolic function is normal. The right ventricular  size is normal.   3. The mitral valve is normal in structure. Trivial mitral valve  regurgitation. No evidence of mitral stenosis.   4. The aortic valve is calcified. Aortic valve regurgitation is not  visualized. Mild aortic valve stenosis. Aortic valve area, by VTI measures  1.45 cm. Aortic valve mean gradient measures 11.5 mmHg. Aortic valve Vmax  measures 2.45 m/s.   Laboratory Data:  High Sensitivity Troponin:   Recent Labs  Lab 12/09/21 1049 12/09/21 1312  TROPONINIHS 119* 113*     Chemistry Recent Labs  Lab 12/11/21 0407 12/12/21 0528 12/13/21 0436  NA 139 141 142  K 3.9 3.8 3.7  CL 112* 113* 111  CO2 20* 22 22  GLUCOSE 95 90 111*  BUN 34* 37* 47*  CREATININE 1.35* 1.19* 1.47*  CALCIUM 9.2 8.8* 9.0  MG 1.8 1.9 2.0  GFRNONAA 40* 47* 36*  ANIONGAP 7 6 9     Recent Labs  Lab 12/11/21 0407 12/12/21 0528 12/13/21 0436  PROT 6.4* 6.3* 6.5  ALBUMIN 3.1* 3.0* 2.9*  AST 80* 60* 56*  ALT 101* 94* 95*  ALKPHOS 50 54 79  BILITOT 0.8 0.6 0.8   Lipids No results for input(s): "CHOL", "TRIG", "HDL", "LABVLDL", "LDLCALC", "CHOLHDL" in the last 168 hours.  Hematology Recent Labs  Lab 12/11/21 0407 12/12/21 0528 12/13/21 0436  WBC 10.3 11.4* 13.3*  RBC 4.13 4.08 4.11  HGB 11.0* 11.1* 11.2*  HCT 34.0* 34.1* 34.7*  MCV 82.3 83.6 84.4  MCH 26.6 27.2 27.3  MCHC 32.4 32.6 32.3  RDW 15.7* 15.9* 16.2*  PLT 184 182 197   Thyroid  Recent Labs  Lab 12/09/21 2211  TSH 1.225    BNPNo results for input(s): "BNP", "PROBNP" in the last 168 hours.  DDimer No results for input(s): "DDIMER" in the last 168  hours.   Radiology/Studies:  CT CHEST ABDOMEN PELVIS W CONTRAST  Result Date: 12/12/2021 CLINICAL DATA:  Occult malignancy 142218 Myopathy 142218 EXAM: CT CHEST, ABDOMEN, AND PELVIS WITH CONTRAST TECHNIQUE: Multidetector CT imaging of the chest, abdomen and pelvis was performed following the standard protocol during bolus administration of intravenous contrast. RADIATION DOSE REDUCTION: This exam was performed according to the departmental dose-optimization program which includes automated exposure control, adjustment of the mA and/or kV according to patient size and/or use of iterative reconstruction technique. CONTRAST:  151mL OMNIPAQUE IOHEXOL 300 MG/ML  SOLN COMPARISON:  12/28/2020 FINDINGS: CT CHEST FINDINGS Cardiovascular: Extensive multi-vessel coronary artery calcification. Calcification of the aortic valve leaflets noted. Global cardiac size within normal limits. Left subclavian dual lead pacemaker in place with leads within the right atrium and right ventricle. No pericardial effusion. Central pulmonary arteries are of normal  caliber. Extensive atherosclerotic calcification within the thoracic aorta. No aortic aneurysm. Interval development of a penetrating atherosclerotic ulcer within the distal descending thoracic aorta best seen on axial image # 38/2 and coronal image # 89/5 measuring 9 mm in diameter and 5 mm in depth. No periaortic hematoma seen. Mediastinum/Nodes: Visualized thyroid is unremarkable. No pathologic thoracic adenopathy. Esophagus unremarkable. Lungs/Pleura: Mild left basilar dependent atelectasis. Lungs are otherwise clear. No pneumothorax or pleural effusion. Central airways are widely patent. Musculoskeletal: No chest wall mass or suspicious bone lesions identified. CT ABDOMEN PELVIS FINDINGS Hepatobiliary: The gallbladder is unremarkable. There is mild intrahepatic and marked extrahepatic biliary ductal dilation with the extrahepatic bile duct measuring up to 16 mm in  diameter, stable since prior examination. A distal obstructing process, such as a biliary stricture or ampullary stenosis, is not excluded. No enhancing intrahepatic mass identified. Pancreas: Unremarkable Spleen: Unremarkable Adrenals/Urinary Tract: The adrenal glands are unremarkable. The kidneys are normal in size and position. 9 mm hypoenhancing cortical nodule is seen within the interpolar region of the left kidney which demonstrates interval increase in size since prior examination where this measured approximately 7 mm and is suspicious for a solid renal neoplasm. No hydronephrosis. No intrarenal or ureteral calculi. The bladder is unremarkable. Stomach/Bowel: Moderate colonic stool burden without evidence of obstruction. Mild sigmoid diverticulosis without superimposed acute inflammatory change. Stomach, small bowel, and large bowel are otherwise unremarkable. Appendix normal. No free intraperitoneal gas or fluid. Vascular/Lymphatic: Extensive aortoiliac atherosclerotic calcification. Suspected hemodynamically significant stenoses involving the celiac axis and superior mesenteric artery origins as well as the renal ostia bilaterally, not well characterized on this examination. Broad-based penetrating atherosclerotic ulcer involving the left lateral infrarenal abdominal aorta appears stable in size measuring 18 x 21 mm with a depth of approximately 6 mm but demonstrates a small amount of increasing mural thrombus, best seen on axial image # 71/2 and coronal image # 61/5. 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 This is best measured on the current examination on coronal image # 69/5 and axial image # 86/2. Aneurysm demonstrating a limited focal dissection involving the right common iliac artery is unchanged measuring 2.0 cm in diameter. 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 No pathologic adenopathy within the abdomen and pelvis. Reproductive: Status post hysterectomy. No adnexal masses. Other: Subcutaneous gas within the left lower quadrant abdominal wall likely relates to subcutaneous injection. No abdominal wall hernia. No abdominopelvic ascites. Musculoskeletal: Degenerative changes are seen within the lumbar spine. No acute bone abnormality. No lytic or blastic bone lesion. IMPRESSION: 1. Interval development of a penetrating atherosclerotic ulcer within the distal descending thoracic aorta measuring 9 mm in diameter and 5 mm in depth. No periaortic hematoma seen. 2. Stable size of a broad-based penetrating atherosclerotic ulcer involving the left lateral infrarenal abdominal aorta measuring 21 mm in diameter and 7 mm in depth. 3. 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 4. 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 5. Extensive multi-vessel coronary artery calcification. 6. 9 mm hypoenhancing cortical nodule within the interpolar region of the left kidney demonstrating interval increase in size since prior examination and suspicious for a solid renal neoplasm. This could be further assessed with dedicated renal mass protocol CT or MRI examination if clinically indicated.  7. Suspected hemodynamically significant stenoses involving the celiac axis and superior mesenteric artery origins as well as the renal ostia bilaterally, not well characterized on this examination. Clinical correlation for signs and symptoms of hemodynamically significant renal artery stenosis or chronic mesenteric ischemia is recommended. If present, this would be better assessed with CT arteriography. 8. Stable marked extrahepatic biliary ductal dilation. A distal obstructing process, such as a biliary  stricture or ampullary stenosis, is not excluded. Correlation with liver enzymes would be helpful for further evaluation. If abnormal, this could be further assessed with ERCP or MRCP examination. Aortic Atherosclerosis (ICD10-I70.0). Electronically Signed   By: Fidela Salisbury M.D.   On: 12/12/2021 14:23   CT CERVICAL SPINE WO CONTRAST  Result Date: 12/09/2021 CLINICAL DATA:  Myelopathy, acute, cervical spine; Myelopathy, acute, lumbar spine; Myelopathy, acute, thoracic spine EXAM: CT CERVICAL, THORACIC, AND LUMBAR SPINE WITHOUT CONTRAST TECHNIQUE: Multidetector CT imaging of the cervical, thoracic and lumbar spine was performed without intravenous contrast. Multiplanar CT image reconstructions were also generated. RADIATION DOSE REDUCTION: This exam was performed according to the departmental dose-optimization program which includes automated exposure control, adjustment of the mA and/or kV according to patient size and/or use of iterative reconstruction technique. COMPARISON:  CT abdomen pelvis 11/17/2017, CT abdomen pelvis 03/14/2020 FINDINGS: CT CERVICAL SPINE FINDINGS Alignment: Normal. Skull base and vertebrae: Multilevel mild-to-moderate degenerative changes spine most prominent at the C5 through C7 levels. Associated intervertebral disc space narrowing at these levels. No severe osseous central canal or neural foraminal stenosis. No acute fracture. No aggressive appearing focal osseous lesion or focal pathologic process. Soft tissues and spinal canal: No prevertebral fluid or swelling. No visible canal hematoma. Upper chest: Centrilobular emphysematous changes. Other: 1.2 cm hypodense lesion within the right thyroid gland. Not clinically significant; no follow-up imaging recommended (ref: J Am Coll Radiol. 2015 Feb;12(2): 143-50).Atherosclerotic plaque of the carotid arteries within the neck. CT THORACIC SPINE FINDINGS Alignment: Normal. Vertebrae: No severe osseous neural foraminal or central canal  stenosis. No acute fracture or focal pathologic process. Paraspinal and other soft tissues: Negative. Disc levels: Developing intervertebral disc space vacuum phenomenon at a couple levels. Otherwise maintained. CT LUMBAR SPINE FINDINGS Segmentation: 5 lumbar type vertebrae. Alignment: Normal. Vertebrae: Mild multilevel degenerative changes spine. Diffusely decreased bone density. No severe osseous central canal or neural foraminal stenosis. No acute fracture or focal pathologic process. Paraspinal and other soft tissues: Negative. Disc levels: Intervertebral disc space vacuum phenomenon at the L5-S1 level. 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 Redemonstration of several focal areas of distension of the abdominal aorta and with no aneurysm. Severe atherosclerotic plaque. Four-vessel coronary calcification. Partially visualized cardiac leads. IMPRESSION: 1. No acute displaced fracture or traumatic listhesis of the cervical, thoracic, lumbar spine. 2. No severe osseous neural foraminal or central canal stenosis of the cervical, thoracic, lumbar spine. 3. 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 4. 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 5. Aortic Atherosclerosis (ICD10-I70.0) - severe including four-vessel coronary calcification. 6.  Emphysema (ICD10-J43.9). Electronically Signed   By: Iven Finn M.D.   On: 12/09/2021 21:51   CT THORACIC  SPINE WO CONTRAST  Result Date: 12/09/2021 CLINICAL DATA:  Myelopathy, acute, cervical spine; Myelopathy, acute, lumbar spine; Myelopathy, acute, thoracic spine EXAM: CT  CERVICAL, THORACIC, AND LUMBAR SPINE WITHOUT CONTRAST TECHNIQUE: Multidetector CT imaging of the cervical, thoracic and lumbar spine was performed without intravenous contrast. Multiplanar CT image reconstructions were also generated. RADIATION DOSE REDUCTION: This exam was performed according to the departmental dose-optimization program which includes automated exposure control, adjustment of the mA and/or kV according to patient size and/or use of iterative reconstruction technique. COMPARISON:  CT abdomen pelvis 11/17/2017, CT abdomen pelvis 03/14/2020 FINDINGS: CT CERVICAL SPINE FINDINGS Alignment: Normal. Skull base and vertebrae: Multilevel mild-to-moderate degenerative changes spine most prominent at the C5 through C7 levels. Associated intervertebral disc space narrowing at these levels. No severe osseous central canal or neural foraminal stenosis. No acute fracture. No aggressive appearing focal osseous lesion or focal pathologic process. Soft tissues and spinal canal: No prevertebral fluid or swelling. No visible canal hematoma. Upper chest: Centrilobular emphysematous changes. Other: 1.2 cm hypodense lesion within the right thyroid gland. Not clinically significant; no follow-up imaging recommended (ref: J Am Coll Radiol. 2015 Feb;12(2): 143-50).Atherosclerotic plaque of the carotid arteries within the neck. CT THORACIC SPINE FINDINGS Alignment: Normal. Vertebrae: No severe osseous neural foraminal or central canal stenosis. No acute fracture or focal pathologic process. Paraspinal and other soft tissues: Negative. Disc levels: Developing intervertebral disc space vacuum phenomenon at a couple levels. Otherwise maintained. CT LUMBAR SPINE FINDINGS Segmentation: 5 lumbar type vertebrae. Alignment: Normal. Vertebrae: Mild multilevel degenerative changes spine. Diffusely decreased bone density. No severe osseous central canal or neural foraminal stenosis. No acute fracture or focal pathologic process.  Paraspinal and other soft tissues: Negative. Disc levels: Intervertebral disc space vacuum phenomenon at the L5-S1 level. 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 Redemonstration of several focal areas of distension of the abdominal aorta and with no aneurysm. Severe atherosclerotic plaque. Four-vessel coronary calcification. Partially visualized cardiac leads. IMPRESSION: 1. No acute displaced fracture or traumatic listhesis of the cervical, thoracic, lumbar spine. 2. No severe osseous neural foraminal or central canal stenosis of the cervical, thoracic, lumbar spine. 3. 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 4. 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 5. Aortic Atherosclerosis (ICD10-I70.0) - severe including four-vessel coronary calcification. 6.  Emphysema (ICD10-J43.9). Electronically Signed   By: Iven Finn M.D.   On: 12/09/2021 21:51   CT LUMBAR SPINE WO CONTRAST  Result Date: 12/09/2021 CLINICAL DATA:  Myelopathy, acute, cervical spine; Myelopathy, acute, lumbar spine; Myelopathy, acute, thoracic spine EXAM: CT CERVICAL, THORACIC, AND LUMBAR SPINE WITHOUT CONTRAST TECHNIQUE: Multidetector CT imaging of the cervical, thoracic and lumbar spine was performed without intravenous contrast. Multiplanar CT image reconstructions were also generated. RADIATION DOSE REDUCTION: This exam was performed according to the departmental dose-optimization program which includes automated exposure control, adjustment of the mA and/or kV according to patient size and/or use of iterative reconstruction technique.  COMPARISON:  CT abdomen pelvis 11/17/2017, CT abdomen pelvis 03/14/2020 FINDINGS: CT CERVICAL SPINE FINDINGS Alignment: Normal. Skull base and vertebrae: Multilevel mild-to-moderate degenerative changes spine most prominent at the C5  through C7 levels. Associated intervertebral disc space narrowing at these levels. No severe osseous central canal or neural foraminal stenosis. No acute fracture. No aggressive appearing focal osseous lesion or focal pathologic process. Soft tissues and spinal canal: No prevertebral fluid or swelling. No visible canal hematoma. Upper chest: Centrilobular emphysematous changes. Other: 1.2 cm hypodense lesion within the right thyroid gland. Not clinically significant; no follow-up imaging recommended (ref: J Am Coll Radiol. 2015 Feb;12(2): 143-50).Atherosclerotic plaque of the carotid arteries within the neck. CT THORACIC SPINE FINDINGS Alignment: Normal. Vertebrae: No severe osseous neural foraminal or central canal stenosis. No acute fracture or focal pathologic process. Paraspinal and other soft tissues: Negative. Disc levels: Developing intervertebral disc space vacuum phenomenon at a couple levels. Otherwise maintained. CT LUMBAR SPINE FINDINGS Segmentation: 5 lumbar type vertebrae. Alignment: Normal. Vertebrae: Mild multilevel degenerative changes spine. Diffusely decreased bone density. No severe osseous central canal or neural foraminal stenosis. No acute fracture or focal pathologic process. Paraspinal and other soft tissues: Negative. Disc levels: Intervertebral disc space vacuum phenomenon at the L5-S1 level. 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 Redemonstration of several focal areas of distension of the abdominal aorta and with no  aneurysm. Severe atherosclerotic plaque. Four-vessel coronary calcification. Partially visualized cardiac leads. IMPRESSION: 1. No acute displaced fracture or traumatic listhesis of the cervical, thoracic, lumbar spine. 2. No severe osseous neural foraminal or central canal stenosis of the cervical, thoracic, lumbar spine. 3. 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 4. 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 5. Aortic Atherosclerosis (ICD10-I70.0) - severe including four-vessel coronary calcification. 6.  Emphysema (ICD10-J43.9). Electronically Signed   By: Iven Finn M.D.   On: 12/09/2021 21:51   CT HEAD WO CONTRAST (5MM)  Result Date: 12/09/2021 CLINICAL DATA:  Hypertension, diabetes mellitus, atrial fibrillation, generalized weakness EXAM: CT HEAD WITHOUT CONTRAST TECHNIQUE: Contiguous axial images were obtained from the base of the skull through the vertex without intravenous contrast. RADIATION DOSE REDUCTION: This exam was performed according to the departmental dose-optimization program which includes automated exposure control, adjustment of the mA and/or kV according to patient size and/or use of iterative reconstruction technique. COMPARISON:  01/06/2021 FINDINGS: Brain: Generalized atrophy. Normal ventricular morphology. No midline shift or mass effect. Small vessel chronic ischemic changes of deep cerebral white matter. BILATERAL old basal ganglia lacunar infarcts. Small old infarct RIGHT cerebellum. No intracranial hemorrhage, mass lesion, evidence of acute infarction, or extra-axial fluid collection. Vascular: Atherosclerotic calcifications of internal carotid arteries at skull base. No hyperdense vessels. Skull: Intact Sinuses/Orbits: Partial opacification of BILATERAL ethmoid air cells. Remaining paranasal sinuses and  mastoid air cells clear Other: Significant ossification within falx. IMPRESSION: Atrophy with small vessel chronic ischemic changes of deep cerebral white matter. 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 No acute intracranial abnormalities. Electronically Signed   By: Lavonia Dana M.D.   On: 12/09/2021 18:31   DG Chest Portable 1 View  Result Date: 12/09/2021 CLINICAL DATA:  Short of breath, hypertension, generalized weakness EXAM: PORTABLE CHEST 1 VIEW COMPARISON:  12/28/2020 FINDINGS: Single frontal view of the chest demonstrates a stable cardiac silhouette.  Dual lead pacemaker again noted. Stable atherosclerosis of the thoracic aorta. No acute airspace disease, effusion, or pneumothorax. No acute bony abnormality. IMPRESSION: 1. Stable chest, no acute process. Electronically Signed   By: Randa Ngo M.D.   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 She was admitted for lower extremity weakness found to have AKI, elevated CK, myopathy, rhabdomyolysis.  Patient may need possible procedure/anesthesia, cardiology as to perform risk evaluation.  From a cardiac perspective, patient appears stable. She denies chest pain, shortness of breath, lower leg edema, orthopnea, PND.  Patient with 2 prior cardiac catheterizations in 2022, showing severe one-vessel CAD involving the mid right coronary artery. During both procedures, attempted angioplasty of the right coronary artery was unsuccessful due to inability to cross the stenosis despite multiple attempts. Otherwise cath showed nonobstructive moderate disease. Recommended continuing medical therapy. Echo in November 2022 showed normal LVEF,known severe LVH, grade 1 diastolic dysfunction, trivial MR.  She appears euvolemic on exam. She has known history of HCM on  Toprol 50 mg daily.  Patient has history of high-grade heart block status post pacemaker followed by EP.  Most recent device check showed normal functioning device.  At baseline, patient is not very functional. Prior to leg weakness, she reports she was walking around without assistance.  METs less than 4.  According to the RCRI patient is class IV risk, 15% 30-day risk of death, MI or cardiac arrest.  Patient overall is high risk.  Given no new cardiac symptoms, no plan for further cardiac work-up prior to procedure. Okay to hold Plavix for 5 days if neccessary prior to procedure, continue Aspirin perioperatively.    For questions or updates, please contact [place] Please consult [email or web address] for contact info under    Signed, Vinie Charity Ninfa Meeker, PA-C  12/13/2021 3:15 PM

## 2021-12-13 NOTE — TOC Progression Note (Signed)
Transition of Care [facility]) - Progression Note    Patient Details  Name: Shannon Chen MRN: [id number] Date of Birth: November 10, 1945  Transition of [facility]) CM/SW Contact  Beverly Sessions, RN Phone Number: 12/13/2021, 10:03 AM  Clinical Narrative:    Per MD patient not medically ready for discharge, requiring additional medical work up Sharyn Lull at [place] [facility] [employer] auth for SNF expires today         Expected Discharge Plan and Services                                                 Social Determinants of Health ([place]) Interventions    Readmission Risk Interventions    12/10/2021    2:16 PM 12/15/2020    4:10 PM  Readmission Risk Prevention Plan  Transportation Screening Complete Complete  PCP or Specialist Appt within 5-7 Days  Complete  Home Care Screening  Complete  Medication Review (RN CM)  Complete  HRI or Home Care Consult Complete   Social Work Consult for [place] Planning/Counseling Complete   Palliative Care Screening Not Applicable   Medication Review [employer]) Complete

## 2021-12-14 DIAGNOSIS — R29898 Other symptoms and signs involving the musculoskeletal system: Secondary | ICD-10-CM | POA: Diagnosis not present

## 2021-12-14 DIAGNOSIS — G729 Myopathy, unspecified: Secondary | ICD-10-CM | POA: Diagnosis not present

## 2021-12-14 DIAGNOSIS — R531 Weakness: Secondary | ICD-10-CM | POA: Diagnosis not present

## 2021-12-14 LAB — GLUCOSE, CAPILLARY
Glucose-Capillary: 93 mg/dL (ref 70–99)
Glucose-Capillary: 165 mg/dL — ABNORMAL HIGH (ref 70–99)
Glucose-Capillary: 198 mg/dL — ABNORMAL HIGH (ref 70–99)
Glucose-Capillary: 192 mg/dL — ABNORMAL HIGH (ref 70–99)

## 2021-12-14 LAB — BASIC METABOLIC PANEL
Anion gap: 7 (ref 5–15)
BUN: 47 mg/dL — ABNORMAL HIGH (ref 8–23)
CO2: 26 mmol/L (ref 22–32)
Calcium: 9.2 mg/dL (ref 8.9–10.3)
Chloride: 108 mmol/L (ref 98–111)
Creatinine, Ser: 1.14 mg/dL — ABNORMAL HIGH (ref 0.44–1.00)
GFR, Estimated: 49 mL/min — ABNORMAL LOW (ref >60)
Glucose, Bld: 99 mg/dL (ref 70–99)
Potassium: 3.6 mmol/L (ref 3.5–5.1)
Sodium: 141 mmol/L (ref 135–145)

## 2021-12-14 LAB — CBC
HCT: 34.4 % — ABNORMAL LOW (ref 36.0–46.0)
Hemoglobin: 11.1 g/dL — ABNORMAL LOW (ref 12.0–15.0)
MCH: 26.7 pg (ref 26.0–34.0)
MCHC: 32.3 g/dL (ref 30.0–36.0)
MCV: 82.7 fL (ref 80.0–100.0)
Platelets: 215 10*3/uL (ref 150–400)
RBC: 4.16 MIL/uL (ref 3.87–5.11)
RDW: 16.5 % — ABNORMAL HIGH (ref 11.5–15.5)
WBC: 11.9 10*3/uL — ABNORMAL HIGH (ref 4.0–10.5)
nRBC: 0 % (ref 0.0–0.2)

## 2021-12-14 LAB — MAGNESIUM: Magnesium: 1.9 mg/dL (ref 1.7–2.4)

## 2021-12-14 LAB — LACTATE DEHYDROGENASE: LDH: 239 U/L — ABNORMAL HIGH (ref 98–192)

## 2021-12-14 MED ORDER — ENOXAPARIN SODIUM 40 MG/0.4ML IJ SOSY
40.0000 mg | PREFILLED_SYRINGE | INTRAMUSCULAR | Status: DC
  Administered 2021-12-14 – 2021-12-18 (×4): 40 mg via SUBCUTANEOUS
  Filled 2021-12-14 (×4): qty 0.4

## 2021-12-14 MED ORDER — CLOPIDOGREL BISULFATE 75 MG PO TABS
75.0000 mg | ORAL_TABLET | Freq: Every day | ORAL | Status: DC
  Administered 2021-12-15 – 2021-12-23 (×8): 75 mg via ORAL
  Filled 2021-12-14 (×8): qty 1

## 2021-12-14 NOTE — Progress Notes (Addendum)
PROGRESS NOTE    Shannon Chen  TWS:[id number] DOB: [birth year] DOA: 12/09/2021 PCP: Remi Haggard, FNP  [id number]/[id number]-AA  LOS: 4 days   Brief hospital course:   Assessment & Plan: Shannon Chen is a 76 year old with history of CAD, HOCM, AV block status post PPM, HTN, prior CVA admitted for bilateral lower extremity weakness..  About 5 days ago she started experiencing bilateral lower extremity weakness and difficulty walking.  In the ED she was found to have rhabdomyolysis.  CT of the head, cervical thoracic and lumbar spine did not show any acute pathology.  *Bilateral lower extremity weakness 2/2 myopathy Rhabdomyolysis.   Sudden onset about 5 days ago.  Reported numbness and weakness but no pain.  CT head, cervical, lumbar and thoracic spine are unremarkable.  Unable to get MRI due to pacemaker.  Neurology team consulted.  CK elevated at 6906, folate, B12 low normal, TSH normal.  CRP wnl, ESR only mildly elevated at 37. --Vit D 15 (low), which may contribute to proximal myopathy. -GGT normal, confirming that elevated AST/ALT are secondary to muscle injury and not hepatic injury --started on steroid 1 mg/kg dosing of prednisone. Plan: -Continue 65 mg daily prednisone for 4-6 weeks (until rheumatology follow-up), taper per outpatient provider and pending clinical course and workup  --cont protonix and Ca supplementation while on steroid -outpatient rheumatologist/neurologist to consider pneumocystis jiroveci prophylaxis if high dose steroids will be continued longer term -Pending: serum myoglobin, myositis panel, anti-HMG CoA reductase antibody, MMA  -cont Vitamin D repletion, 50,000 units weekly for 8 weeks --cont vit B12 supplement --Continue to hold statin --consult GenSurg today for muscle biopsy to assess for PM (ok to obtain from quadriceps femoris, per neuro)   Common iliac artery aneurysm  --enlarging as per current CT c/a/p --Vascular consulted, with Dr. Delana Meyer   --tentatively plan for stent graft next Wed  9 mm nodule in left kidney --interval increase in size.  Raises possibility for paraneoplastic syndrome as presented with PM. --Oncology consulted, with Dr. Tasia Catchings --CT renal protocal next week (to avoid too much contrasted studies at the same time)  Acute kidney injury superimposed on CKD ([place]) Baseline creatinine 0.9.  Admission creatinine 2.08, slowly improving with IV fluids.   --cont LR_0  for now   Hypertension - cont amlodipine and Toprol - Holding home finerenone due to AKI   Hypertrophic cardiomyopathy ([place]) Patient is euvolemic at this time.  She is chest pain-free with stable EKG and troponins within her baseline range   Type 2 diabetes mellitus with hyperlipidemia [facility]) She is on [place] for heart failure.  Hemoglobin A1c 6.4 --SSI while on steroid   Chronic obstructive pulmonary disease ([name]) --cont daily bronchodilators    DVT prophylaxis: Lovenox SQ Code Status: Full code  Family Communication: pt declined for me to update anyone Level of care: Med-Surg Dispo:   The patient is from: home Anticipated d/c is to: SNF rehab Anticipated d/c date is: undetermined   Subjective and Interval History:  Pt reported doing better, feeling stronger.   Objective: Vitals:   12/13/21 1512 12/13/21 1923 12/14/21 0355 12/14/21 0753  BP: (!) 118/57 139/60 (!) 175/74 (!) 164/65  Pulse: 74 71 72 66  Resp: _1 Temp: 98.2 F (36.8 C) (!) 97.3 F (36.3 C) 97.8 F (36.6 C) 98 F (36.7 C)  TempSrc: Oral Oral Oral Oral  SpO2: 97% 94% 98% 96%  Weight:      Height:  Intake/Output Summary (Last 24 hours) at 12/14/2021 1339 Last data filed at 12/14/2021 1000 Gross per 24 hour  Intake 720 ml  Output 1000 ml  Net -280 ml   Filed Weights   12/09/21 0952  Weight: 64.9 kg    Examination:   Constitutional: NAD, AAOx3 HEENT: conjunctivae and lids normal, EOMI CV: No cyanosis.   RESP: normal respiratory effort,  on RA Neuro: II - XII grossly intact.   Psych: Normal mood and affect.  Appropriate judgement and reason   Data Reviewed: I have personally reviewed labs and imaging studies  Time spent: 35 minutes  Enzo Bi, MD [facility] If 7PM-7AM, please contact night-coverage 12/14/2021, 1:39 PM

## 2021-12-14 NOTE — Progress Notes (Signed)
Neurology Progress Note  Patient ID: Shannon Chen is a 76 y.o. with PMHx of  has a past medical history of Arthritis, AV block, Mobitz II, CAD (coronary artery disease), Carotid artery disease ([place]), Essential hypertension, History of stress test, Hyperlipidemia, Hypertrophic cardiomyopathy ([place]), and Presence of permanent cardiac pacemaker.  Subjective: Continuing to improve  No other new complains on ROS  Exam: Current vital signs: BP (!) 164/65 (BP Location: Left Arm)   Pulse 66   Temp 98 F (36.7 C) (Oral)   Resp 18   Ht 5' 5.5" (1.664 m)   Wt 64.9 kg   SpO2 96%   BMI 23.43 kg/m  Vital signs in last 24 hours: Temp:  [97.3 F (36.3 C)-98.2 F (36.8 C)] 98 F (36.7 C) (10/28 0753) Pulse Rate:  [66-74] 66 (10/28 0753) Resp:  [16-18] 18 (10/28 0753) BP: (118-175)/(57-74) 164/65 (10/28 0753) SpO2:  [94 %-98 %] 96 % (10/28 0753)   Current Facility-Administered Medications:    acetaminophen (TYLENOL) tablet 650 mg, 650 mg, Oral, Q6H PRN, 650 mg at 12/11/21 0358 **OR** acetaminophen (TYLENOL) suppository 650 mg, 650 mg, Rectal, Q6H PRN, Jose Persia, MD   alum & mag hydroxide-simeth (MAALOX/MYLANTA) 200-200-20 MG/5ML suspension 15 mL, 15 mL, Oral, Q6H PRN, Enzo Bi, MD, 15 mL at 12/12/21 0939   amLODipine (NORVASC) tablet 2.5 mg, 2.5 mg, Oral, Daily, Jose Persia, MD, 2.5 mg at 12/14/21 0753   aspirin EC tablet 81 mg, 81 mg, Oral, Daily, Jose Persia, MD, 81 mg at 12/14/21 2952   calcium carbonate (OS-CAL - dosed in mg of elemental calcium) tablet 1,250 mg, 500 mg of elemental calcium, Oral, BID WC, Enzo Bi, MD, 1,250 mg at 12/14/21 0753   calcium carbonate (TUMS - dosed in mg elemental calcium) chewable tablet 200 mg of elemental calcium, 1 tablet, Oral, TID PRN, Enzo Bi, MD, 200 mg of elemental calcium at 12/12/21 1314   clopidogrel (PLAVIX) tablet 75 mg, 75 mg, Oral, Daily, Jose Persia, MD, 75 mg at 12/14/21 0754   cyanocobalamin (VITAMIN B12) tablet 1,000  mcg, 1,000 mcg, Oral, Daily, Kynslei Art L, MD, 1,000 mcg at 12/14/21 0753   docusate sodium (COLACE) capsule 100 mg, 100 mg, Oral, BID, Jose Persia, MD, 100 mg at 12/14/21 0752   enoxaparin (LOVENOX) injection 40 mg, 40 mg, Subcutaneous, Q24H, Beers, Shanon Brow, RPH, 40 mg at 12/13/21 2106   fluticasone furoate-vilanterol (BREO ELLIPTA) 200-25 MCG/ACT 1 puff, 1 puff, Inhalation, Daily, 1 puff at 12/14/21 0751 **AND** umeclidinium bromide (INCRUSE ELLIPTA) 62.5 MCG/ACT 1 puff, 1 puff, Inhalation, Daily, Jose Persia, MD, 1 puff at 84/13/24 4010   folic acid (FOLVITE) tablet 1 mg, 1 mg, Oral, Daily, Jose Persia, MD, 1 mg at 12/14/21 0752   guaiFENesin (ROBITUSSIN) 100 MG/5ML liquid 5 mL, 5 mL, Oral, Q4H PRN, Amin, Ankit Chirag, MD   hydrALAZINE (APRESOLINE) injection 10 mg, 10 mg, Intravenous, Q4H PRN, Amin, Ankit Chirag, MD, 10 mg at 12/12/21 0507   insulin aspart (novoLOG) injection 0-6 Units, 0-6 Units, Subcutaneous, TID WC, Fillmore Bynum L, MD, 1 Units at 12/11/21 1616   ipratropium-albuterol (DUONEB) 0.5-2.5 (3) MG/3ML nebulizer solution 3 mL, 3 mL, Nebulization, Q4H PRN, Amin, Jeanella Flattery, MD   lactated ringers infusion, , Intravenous, Continuous, Enzo Bi, MD, Last Rate: 50 mL/hr at 12/13/21 1752, New Bag at 12/13/21 1752   meclizine (ANTIVERT) tablet 12.5 mg, 12.5 mg, Oral, BID PRN, Amin, Ankit Chirag, MD   metoprolol succinate (TOPROL-XL) 24 hr tablet 50 mg, 50 mg,  Oral, Daily, Jose Persia, MD, 50 mg at 12/14/21 0754   metoprolol tartrate (LOPRESSOR) injection 5 mg, 5 mg, Intravenous, Q4H PRN, Amin, Ankit Chirag, MD   mirabegron ER (MYRBETRIQ) tablet 25 mg, 25 mg, Oral, Daily, Jose Persia, MD, 25 mg at 12/14/21 0855   mirtazapine (REMERON) tablet 7.5 mg, 7.5 mg, Oral, QHS, Jose Persia, MD, 7.5 mg at 12/13/21 2106   multivitamin with minerals tablet 1 tablet, 1 tablet, Oral, Daily, Jose Persia, MD, 1 tablet at 12/14/21 0753   pantoprazole (PROTONIX) EC tablet  40 mg, 40 mg, Oral, Daily, Enzo Bi, MD, 40 mg at 12/14/21 0753   polyethylene glycol (MIRALAX / GLYCOLAX) packet 17 g, 17 g, Oral, Daily PRN, Jose Persia, MD   predniSONE (DELTASONE) tablet 65 mg, 65 mg, Oral, Q breakfast, Jose Persia, MD, 65 mg at 12/14/21 0752   sodium chloride flush (NS) 0.9 % injection 3 mL, 3 mL, Intravenous, Q12H, Jose Persia, MD, 3 mL at 12/14/21 0759   thiamine (VITAMIN B1) tablet 100 mg, 100 mg, Oral, Daily, Jose Persia, MD, 100 mg at 12/14/21 0754   traZODone (DESYREL) tablet 50 mg, 50 mg, Oral, QHS PRN, Amin, Ankit Chirag, MD   Vitamin D (Ergocalciferol) (DRISDOL) 1.25 MG (50000 UNIT) capsule 50,000 Units, 50,000 Units, Oral, Q7 days, Jania Steinke L, MD, 50,000 Units at 12/11/21 1607    Gen: In bed, comfortable  Resp: non-labored breathing, no grossly audible wheezing Cardiac: Perfusing extremities well  Abd: soft, nt Extremities: Stable bruising on her arms  Neuro: MS: Awake, alert, oriented to time, person, place, situation CN: Face symmetric, tracking examiner Motor: 5/5 deltoids today 3/5 bilateral hip flexion (more readily briefly lifts both legs slightly off the bed today), 5/5 knee extension, 4+/5 knee flexion on the left and 5/5 on the right  Sensory: Remains intact DTR: 3+ and symmetric in the biceps / brachioradialis, 2+ symmetric patellar on prior evals   Pertinent Labs:   Basic Metabolic Panel: Recent Labs  Lab 12/10/21 0348 12/11/21 0407 12/12/21 0528 12/13/21 0436 12/14/21 0418  NA 141 139 141 142 141  K 4.3 3.9 3.8 3.7 3.6  CL 116* 112* 113* 111 108  CO2 19* 20* _0 GLUCOSE 83 95 90 111* 99  BUN 34* 34* 37* 47* 47*  CREATININE 1.61* 1.35* 1.19* 1.47* 1.14*  CALCIUM 8.3* 9.2 8.8* 9.0 9.2  MG  --  1.8 1.9 2.0 1.9     CBC: Recent Labs  Lab 12/10/21 0348 12/11/21 0407 12/12/21 0528 12/13/21 0436 12/14/21 0418  WBC 4.8 10.3 11.4* 13.3* 11.9*  NEUTROABS 2.7  --   --   --   --   HGB 11.1* 11.0*  11.1* 11.2* 11.1*  HCT 35.7* 34.0* 34.1* 34.7* 34.4*  MCV 84.6 82.3 83.6 84.4 82.7  PLT 176 184 182 197 215     Coagulation Studies: No results for input(s): "LABPROT", "INR" in the last 72 hours.     Latest Reference Range & Units 12/10/21 15:40  Aldolase 3.3 - 10.3 U/L 26.7 (H)  (H): Data is abnormally high   Latest Reference Range & Units 12/10/21 15:40  Vitamin D, 25-Hydroxy 30 - 100 ng/mL 15.65 (L)  (L): Data is abnormally low    Latest Reference Range & Units 01/02/21 03:56 12/09/21 22:11 12/10/21 03:48 12/10/21 09:20 12/11/21 04:07 12/13/21 04:36  CK Total 38 - 234 U/L 58 6,906 (H) 6,635 (H) 6,079 (H) 4,647 (H) 2,301 (H)  (H): Data is abnormally high  Lab Results  Component Value Date   ALT 95 (H) 12/13/2021   AST 56 (H) 12/13/2021   GGT 24 12/11/2021   ALKPHOS 79 12/13/2021   BILITOT 0.8 12/13/2021   ESR minimally elevated at 37 CRP 0.5 TSH normal at 1.225 A1c 6.4% RPR negative B12 low normal at 339 with MMA elevated at 514 Anti-Jo-1 negative Myoglobin, serum 4,189  CTA chest/abdomen/pelvis 1. Interval development of a penetrating atherosclerotic ulcer within the distal descending thoracic aorta measuring 9 mm in diameter and 5 mm in depth. No periaortic hematoma seen. 2. Stable size of a broad-based penetrating atherosclerotic ulcer involving the left lateral infrarenal abdominal aorta measuring 21 mm in diameter and 7 mm in depth. 3. 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 4. 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 5. Extensive multi-vessel coronary artery calcification. 6. 9 mm hypoenhancing cortical nodule within the interpolar region of the left kidney demonstrating interval increase in size since prior examination and suspicious for a solid renal neoplasm. This could be further assessed with dedicated  renal mass protocol CT or MRI examination if clinically indicated. 7. Suspected hemodynamically significant stenoses involving the celiac axis and superior mesenteric artery origins as well as the renal ostia bilaterally, not well characterized on this examination. Clinical correlation for signs and symptoms of hemodynamically significant renal artery stenosis or chronic mesenteric ischemia is recommended. If present, this would be better assessed with CT arteriography. 8. Stable marked extrahepatic biliary ductal dilation. A distal obstructing process, such as a biliary stricture or ampullary stenosis, is not excluded. Correlation with liver enzymes would be helpful for further evaluation. If abnormal, this could be further assessed with ERCP or MRCP examination.  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 Vitamin D deficiency can play a role in proximal toxic/metabolic myopathy although often with CK levels being normal and with more severe deficiency than this patient has.  Vitamin D deficiency can also play a role in risk for inflammatory myopathies. Low B12 can also be an exacerbating factor. Favor leukocytosis is secondary to steroid treatment.   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   Recommendations: -Continue 65 mg daily prednisone for 4-6 weeks (until rheumatology follow-up), taper per outpatient provider and  pending clinical course and workup   -Vitamin D already ordered, additionally calcium supplementation while on steroids, PPI, to be ordered by primary team -outpatient rheumatologist/neurologist to consider pneumocystis jiroveci prophylaxis if high dose steroids will be continued longer term -Pending myositis panel, anti-HMG CoA reductase antibody -Vitamin D repletion, 50,000 units weekly for 8 weeks, further repletion/monitoring per primary team/PCP -Continue B12 1000 mcg oral, goal B12 greater than 600 -GGT normal, confirming that elevated AST/ALT are secondary to muscle injury and not hepatic injury -Continue to hold statin, would not restart until anti-HMG-CoA results and pending other workup -Glucose checks given patient is receiving steroids and has an elevated A1c at 6.4%; appreciate primary team adjustment for prandial hyperglycemia secondary to steroids -In discussion with  oncology will additionally pursue muscle biopsy while here, recommend deltoid muscle (either left or right) -Outpatient can consider EMG/NCS, referral placed to Dr. 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   Lesleigh Noe MD-PhD [facility] [phone]  Greater than 50 minutes were spent in care of this patient today, the majority in direct patient care

## 2021-12-14 NOTE — Progress Notes (Signed)
Occupational Therapy Treatment Patient Details Name: Shannon Chen MRN: [id number] DOB: May 02, 1945 Today's Date: 12/14/2021   History of present illness Patient is a 76 year old female with medical history significant of CAD, HOCM, AV block s/p PPM, hypertension, hyperlipidemia, prior CVA who presents to the ED with complaints of bilateral lower extremity weakness and transient numbness in legs.   OT comments  Upon entering session, pt resting in bed and agreeable to OT. Tx session targeted improving tolerance for functional mobility in the setting of ADL tasks. Pt's dinner tray arriving during session and agreeable to sit up in recliner. Pt required Min-Mod A for bed mobility, Min guard to stand from EOB, and Min guard to take steps toward recliner using RW. Verbal cues required for safety awareness throughout OOB mobility. Pt is making progress toward goal completion. D/C recommendation remains appropriate. OT will continue to follow acutely.      Recommendations for follow up therapy are one component of a multi-disciplinary discharge planning process, led by the attending physician.  Recommendations may be updated based on patient status, additional functional criteria and insurance authorization.    Follow Up Recommendations  Skilled nursing-short term rehab (<3 hours/day)    Assistance Recommended at Discharge Frequent or constant Supervision/Assistance  Patient can return home with the following  Direct supervision/assist for medications management;Help with stairs or ramp for entrance;Assistance with cooking/housework;A little help with walking and/or transfers;A little help with bathing/dressing/bathroom   Equipment Recommendations  Other (comment) (defer to next venue of care)    Recommendations for Other Services      Precautions / Restrictions Precautions Precautions: Fall Restrictions Weight Bearing Restrictions: No       Mobility Bed Mobility Overal bed mobility:  Needs Assistance Bed Mobility: Supine to Sit     Supine to sit: Min assist, Mod assist     General bed mobility comments: assist for bring LEs off EOB and trunk elevation    Transfers Overall transfer level: Needs assistance Equipment used: Rolling walker (2 wheels) Transfers: Sit to/from Stand Sit to Stand: Min guard           General transfer comment: VC for safety, deferrring use of RW at first     Balance Overall balance assessment: Needs assistance Sitting-balance support: Feet supported Sitting balance-Leahy Scale: Fair     Standing balance support: Single extremity supported Standing balance-Leahy Scale: Fair Standing balance comment: RW for UE support                           ADL either performed or assessed with clinical judgement   ADL Overall ADL's : Needs assistance/impaired Eating/Feeding: Set up;Sitting                                   Functional mobility during ADLs: Min guard;Rolling walker (2 wheels) (to take steps toward recliner) General ADL Comments: pt deferred further grooming tasks 2/2 dinner tray arriving    Extremity/Trunk Assessment Upper Extremity Assessment Upper Extremity Assessment: Generalized weakness   Lower Extremity Assessment Lower Extremity Assessment: Generalized weakness        Vision Patient Visual Report: No change from baseline     Perception     Praxis      Cognition Arousal/Alertness: Awake/alert Behavior During Therapy: WFL for tasks assessed/performed Overall Cognitive Status: Within Functional Limits for tasks assessed  Exercises      Shoulder Instructions       General Comments      Pertinent Vitals/ Pain       Pain Assessment Pain Assessment: No/denies pain  Home Living                                          Prior Functioning/Environment              Frequency  Min 2X/week         Progress Toward Goals  OT Goals(current goals can now be found in the care plan section)  Progress towards OT goals: Progressing toward goals  Acute Rehab OT Goals Patient Stated Goal: to go to rehab OT Goal Formulation: With patient/family Time For Goal Achievement: 12/24/21 Potential to Achieve Goals: [place] Discharge plan remains appropriate;Frequency remains appropriate    Co-evaluation                 AM-PAC OT "6 Clicks" Daily Activity     Outcome Measure   Help from another person eating meals?: A Little Help from another person taking care of personal grooming?: A Little Help from another person toileting, which includes using toliet, bedpan, or urinal?: A Lot Help from another person bathing (including washing, rinsing, drying)?: A Lot Help from another person to put on and taking off regular upper body clothing?: A Little Help from another person to put on and taking off regular lower body clothing?: A Lot 6 Click Score: 15    End of Session Equipment Utilized During Treatment: Gait belt;Rolling walker (2 wheels)  OT Visit Diagnosis: Unsteadiness on feet (R26.81);Muscle weakness (generalized) (M62.81)   Activity Tolerance Patient tolerated treatment well   Patient Left in chair;with call bell/phone within reach;with chair alarm set   Nurse Communication Mobility status        Time: 7353-2992 OT Time Calculation (min): 11 min  Charges: OT General Charges $OT Visit: 1 Visit OT Treatments $Self Care/Home Management : 8-22 mins  [facility] MS, OTR/L ascom [phone]  12/14/21, 6:23 PM
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
## 2021-12-15 ENCOUNTER — Inpatient Hospital Stay: Payer: Medicare HMO

## 2021-12-15 DIAGNOSIS — R531 Weakness: Secondary | ICD-10-CM | POA: Diagnosis not present

## 2021-12-15 LAB — CBC
HCT: 33.2 % — ABNORMAL LOW (ref 36.0–46.0)
Hemoglobin: 10.7 g/dL — ABNORMAL LOW (ref 12.0–15.0)
MCH: 27 pg (ref 26.0–34.0)
MCHC: 32.2 g/dL (ref 30.0–36.0)
MCV: 83.6 fL (ref 80.0–100.0)
Platelets: 213 10*3/uL (ref 150–400)
RBC: 3.97 MIL/uL (ref 3.87–5.11)
RDW: 16.4 % — ABNORMAL HIGH (ref 11.5–15.5)
WBC: 10.8 10*3/uL — ABNORMAL HIGH (ref 4.0–10.5)
nRBC: 0 % (ref 0.0–0.2)

## 2021-12-15 LAB — GLUCOSE, CAPILLARY
Glucose-Capillary: 86 mg/dL (ref 70–99)
Glucose-Capillary: 119 mg/dL — ABNORMAL HIGH (ref 70–99)
Glucose-Capillary: 264 mg/dL — ABNORMAL HIGH (ref 70–99)
Glucose-Capillary: 210 mg/dL — ABNORMAL HIGH (ref 70–99)
Glucose-Capillary: 146 mg/dL — ABNORMAL HIGH (ref 70–99)

## 2021-12-15 LAB — BASIC METABOLIC PANEL
Anion gap: 9 (ref 5–15)
BUN: 44 mg/dL — ABNORMAL HIGH (ref 8–23)
CO2: 25 mmol/L (ref 22–32)
Calcium: 8.8 mg/dL — ABNORMAL LOW (ref 8.9–10.3)
Chloride: 108 mmol/L (ref 98–111)
Creatinine, Ser: 1.03 mg/dL — ABNORMAL HIGH (ref 0.44–1.00)
GFR, Estimated: 56 mL/min — ABNORMAL LOW (ref >60)
Glucose, Bld: 94 mg/dL (ref 70–99)
Potassium: 3.6 mmol/L (ref 3.5–5.1)
Sodium: 142 mmol/L (ref 135–145)

## 2021-12-15 LAB — MAGNESIUM: Magnesium: 2 mg/dL (ref 1.7–2.4)

## 2021-12-15 LAB — CANCER ANTIGEN 19-9: CA 19-9: 27 U/mL (ref 0–35)

## 2021-12-15 MED ORDER — IOHEXOL 300 MG/ML  SOLN
100.0000 mL | Freq: Once | INTRAMUSCULAR | Status: AC | PRN
  Administered 2021-12-15: 100 mL via INTRAVENOUS

## 2021-12-15 MED ORDER — LACTATED RINGERS IV SOLN: INTRAVENOUS | Status: AC

## 2021-12-15 NOTE — Plan of Care (Signed)
  Problem: Education: Goal: Knowledge of General Education information will improve Description Including pain rating scale, medication(s)/side effects and non-pharmacologic comfort measures Outcome: Progressing   Problem: Health Behavior/Discharge Planning: Goal: Ability to manage health-related needs will improve Outcome: Progressing   

## 2021-12-15 NOTE — Progress Notes (Signed)
PROGRESS NOTE    Shannon Chen  KCL:[id number] DOB: February 06, 1946 DOA: 12/09/2021 PCP: Remi Haggard, FNP  205A/205A-AA  LOS: 5 days   Brief hospital course:   Assessment & Plan: Shannon Chen is a 76 year old with history of CAD, HOCM, AV block status post PPM, HTN, prior CVA admitted for bilateral lower extremity weakness..  About 5 days ago she started experiencing bilateral lower extremity weakness and difficulty walking.  In the ED she was found to have rhabdomyolysis.  CT of the head, cervical thoracic and lumbar spine did not show any acute pathology.  *Bilateral lower extremity weakness 2/2 myopathy Rhabdomyolysis.   Sudden onset about 5 days ago.  Reported numbness and weakness but no pain.  CT head, cervical, lumbar and thoracic spine are unremarkable.  Unable to get MRI due to pacemaker.  Neurology team consulted.  CK elevated at 6906, folate, B12 low normal, TSH normal.  CRP wnl, ESR only mildly elevated at 37. --Vit D 15 (low), which may contribute to proximal myopathy. -GGT normal, confirming that elevated AST/ALT are secondary to muscle injury and not hepatic injury --started on steroid 1 mg/kg dosing of prednisone. Plan: -Continue 65 mg daily prednisone for 4-6 weeks (until rheumatology follow-up), taper per outpatient provider and pending clinical course and workup  --cont protonix and Ca supplementation while on steroid -outpatient rheumatologist/neurologist to consider pneumocystis jiroveci prophylaxis if high dose steroids will be continued longer term -Pending: serum myoglobin, myositis panel, anti-HMG CoA reductase antibody, MMA  -cont Vitamin D repletion, 50,000 units weekly for 8 weeks --cont vit B12 supplement --Continue to hold statin --GenSurg for muscle biopsy to assess for PM (ok to obtain from quadriceps femoris, per neuro) on Monday   Common iliac artery aneurysm  --enlarging as per current CT c/a/p --Vascular consulted, with Dr. Delana Meyer   --tentatively plan for stent graft next Wed  9 mm nodule in left kidney --interval increase in size.  Raises possibility for paraneoplastic syndrome as presented with PM. --Oncology consulted, with Dr. Tasia Catchings --CT renal protocal w/wo contrast today  Acute kidney injury superimposed on CKD ([place]) Baseline creatinine 0.9.  Admission creatinine 2.08, slowly improving with IV fluids.   --cont LR_0    Hypertension - cont amlodipine and Toprol - Holding home finerenone due to AKI   Hypertrophic cardiomyopathy ([place]) Patient is euvolemic at this time.  She is chest pain-free with stable EKG and troponins within her baseline range   Type 2 diabetes mellitus with hyperlipidemia [facility]) She is on [place] for heart failure.  Hemoglobin A1c 6.4 --SSI while on steroid   Chronic obstructive pulmonary disease ([name]) --cont daily bronchodilators    DVT prophylaxis: Lovenox SQ Code Status: Full code  Family Communication: pt declined for me to update anyone Level of care: Med-Surg Dispo:   The patient is from: home Anticipated d/c is to: SNF rehab Anticipated d/c date is: undetermined   Subjective and Interval History:  No dyspnea.  Normal oral intake.   Objective: Vitals:   12/14/21 1934 12/15/21 0511 12/15/21 0801 12/15/21 1547  BP: 132/61 (!) 168/58 (!) 156/63 (!) 148/64  Pulse: 70 64 65 78  Resp: _1 Temp: 98.3 F (36.8 C) (!) 97.5 F (36.4 C) 97.6 F (36.4 C) 98.4 F (36.9 C)  TempSrc: Oral Oral Oral Oral  SpO2: 100% 96% 98% 98%  Weight:      Height:        Intake/Output Summary (Last 24 hours) at 12/15/2021 1624 Last data filed  at 12/15/2021 0931 Gross per 24 hour  Intake 478 ml  Output --  Net 478 ml   Filed Weights   12/09/21 0952  Weight: 64.9 kg    Examination:   Constitutional: NAD, AAOx3 HEENT: conjunctivae and lids normal, EOMI CV: No cyanosis.   RESP: normal respiratory effort, on RA Neuro: II - XII grossly intact.   Psych: Normal mood and  affect.  Appropriate judgement and reason   Data Reviewed: I have personally reviewed labs and imaging studies  Time spent: 35 minutes  Enzo Bi, MD [facility] If 7PM-7AM, please contact night-coverage 12/15/2021, 4:24 PM

## 2021-12-16 ENCOUNTER — Other Ambulatory Visit: Payer: Self-pay

## 2021-12-16 ENCOUNTER — Inpatient Hospital Stay: Payer: Medicare HMO | Admitting: Anesthesiology

## 2021-12-16 ENCOUNTER — Ambulatory Visit: Payer: Medicare HMO | Admitting: Cardiovascular Disease

## 2021-12-16 ENCOUNTER — Encounter
Admission: EM | Disposition: A | Discharge status: Skilled Nursing Facility | Payer: Self-pay | Source: Home / Self Care | Attending: Hospitalist

## 2021-12-16 DIAGNOSIS — R29898 Other symptoms and signs involving the musculoskeletal system: Secondary | ICD-10-CM | POA: Diagnosis not present

## 2021-12-16 DIAGNOSIS — M6282 Rhabdomyolysis: Secondary | ICD-10-CM | POA: Diagnosis not present

## 2021-12-16 DIAGNOSIS — G729 Myopathy, unspecified: Secondary | ICD-10-CM | POA: Diagnosis not present

## 2021-12-16 DIAGNOSIS — R531 Weakness: Secondary | ICD-10-CM | POA: Diagnosis not present

## 2021-12-16 HISTORY — PX: MUSCLE BIOPSY: SHX716

## 2021-12-16 LAB — BASIC METABOLIC PANEL
Anion gap: 10 (ref 5–15)
BUN: 47 mg/dL — ABNORMAL HIGH (ref 8–23)
CO2: 24 mmol/L (ref 22–32)
Calcium: 9 mg/dL (ref 8.9–10.3)
Chloride: 107 mmol/L (ref 98–111)
Creatinine, Ser: 0.96 mg/dL (ref 0.44–1.00)
GFR, Estimated: >60 mL/min (ref >60)
Glucose, Bld: 99 mg/dL (ref 70–99)
Potassium: 3.9 mmol/L (ref 3.5–5.1)
Sodium: 141 mmol/L (ref 135–145)

## 2021-12-16 LAB — GLUCOSE, CAPILLARY
Glucose-Capillary: 70 mg/dL (ref 70–99)
Glucose-Capillary: 90 mg/dL (ref 70–99)
Glucose-Capillary: 101 mg/dL — ABNORMAL HIGH (ref 70–99)
Glucose-Capillary: 304 mg/dL — ABNORMAL HIGH (ref 70–99)
Glucose-Capillary: 180 mg/dL — ABNORMAL HIGH (ref 70–99)

## 2021-12-16 LAB — CBC
HCT: 34.3 % — ABNORMAL LOW (ref 36.0–46.0)
Hemoglobin: 10.8 g/dL — ABNORMAL LOW (ref 12.0–15.0)
MCH: 27.1 pg (ref 26.0–34.0)
MCHC: 31.5 g/dL (ref 30.0–36.0)
MCV: 86 fL (ref 80.0–100.0)
Platelets: 212 10*3/uL (ref 150–400)
RBC: 3.99 MIL/uL (ref 3.87–5.11)
RDW: 16.3 % — ABNORMAL HIGH (ref 11.5–15.5)
WBC: 12.1 10*3/uL — ABNORMAL HIGH (ref 4.0–10.5)
nRBC: 0 % (ref 0.0–0.2)

## 2021-12-16 LAB — MAGNESIUM: Magnesium: 2.1 mg/dL (ref 1.7–2.4)

## 2021-12-16 SURGERY — MUSCLE BIOPSY
Anesthesia: Monitor Anesthesia Care | Site: Thigh | Laterality: Left

## 2021-12-16 MED ORDER — DEXAMETHASONE SODIUM PHOSPHATE 10 MG/ML IJ SOLN
INTRAMUSCULAR | Status: AC
  Filled 2021-12-16: qty 1

## 2021-12-16 MED ORDER — PROPOFOL 500 MG/50ML IV EMUL
INTRAVENOUS | Status: DC | PRN
  Administered 2021-12-16: 50 ug/kg/min via INTRAVENOUS

## 2021-12-16 MED ORDER — LIDOCAINE HCL (CARDIAC) PF 100 MG/5ML IV SOSY
PREFILLED_SYRINGE | INTRAVENOUS | Status: DC | PRN
  Administered 2021-12-16: 80 mg via INTRAVENOUS

## 2021-12-16 MED ORDER — PROPOFOL 10 MG/ML IV BOLUS
INTRAVENOUS | Status: AC
  Filled 2021-12-16: qty 20

## 2021-12-16 MED ORDER — LIDOCAINE-EPINEPHRINE (PF) 1 %-1:200000 IJ SOLN
INTRAMUSCULAR | Status: AC
  Filled 2021-12-16: qty 30

## 2021-12-16 MED ORDER — ACETAMINOPHEN 10 MG/ML IV SOLN: 1000.0000 mg | Freq: Once | INTRAVENOUS | Status: DC | PRN

## 2021-12-16 MED ORDER — OXYCODONE HCL 5 MG/5ML PO SOLN: 5.0000 mg | Freq: Once | ORAL | Status: DC | PRN

## 2021-12-16 MED ORDER — FENTANYL CITRATE (PF) 100 MCG/2ML IJ SOLN
INTRAMUSCULAR | Status: DC | PRN
  Administered 2021-12-16 (×2): 12.5 ug via INTRAVENOUS
  Administered 2021-12-16: 25 ug via INTRAVENOUS

## 2021-12-16 MED ORDER — LIDOCAINE-EPINEPHRINE (PF) 1 %-1:200000 IJ SOLN
INTRAMUSCULAR | Status: DC | PRN
  Administered 2021-12-16: 20 mL via INTRAMUSCULAR

## 2021-12-16 MED ORDER — OXYCODONE HCL 5 MG PO TABS: 5.0000 mg | ORAL_TABLET | Freq: Once | ORAL | Status: DC | PRN

## 2021-12-16 MED ORDER — TRAMADOL HCL 50 MG PO TABS
50.0000 mg | ORAL_TABLET | Freq: Four times a day (QID) | ORAL | Status: DC | PRN
  Administered 2021-12-16 – 2021-12-17 (×2): 50 mg via ORAL
  Filled 2021-12-16 (×2): qty 1

## 2021-12-16 MED ORDER — ONDANSETRON HCL 4 MG/2ML IJ SOLN
INTRAMUSCULAR | Status: DC | PRN
  Administered 2021-12-16: 4 mg via INTRAVENOUS

## 2021-12-16 MED ORDER — ONDANSETRON HCL 4 MG/2ML IJ SOLN: 4.0000 mg | Freq: Once | INTRAMUSCULAR | Status: DC | PRN

## 2021-12-16 MED ORDER — FENTANYL CITRATE (PF) 100 MCG/2ML IJ SOLN
INTRAMUSCULAR | Status: AC
  Filled 2021-12-16: qty 2

## 2021-12-16 MED ORDER — FENTANYL CITRATE (PF) 100 MCG/2ML IJ SOLN: 25.0000 ug | INTRAMUSCULAR | Status: DC | PRN

## 2021-12-16 MED ORDER — LACTATED RINGERS IV SOLN: INTRAVENOUS | Status: DC

## 2021-12-16 MED ORDER — DEXAMETHASONE SODIUM PHOSPHATE 10 MG/ML IJ SOLN
INTRAMUSCULAR | Status: DC | PRN
  Administered 2021-12-16: 10 mg via INTRAVENOUS

## 2021-12-16 MED ORDER — LACTATED RINGERS IV SOLN: INTRAVENOUS | Status: DC | PRN

## 2021-12-16 MED ORDER — ONDANSETRON HCL 4 MG/2ML IJ SOLN
INTRAMUSCULAR | Status: AC
  Filled 2021-12-16: qty 2

## 2021-12-16 SURGICAL SUPPLY — 32 items
BLADE SURG 15 STRL LF DISP TIS (BLADE) ×1 IMPLANT
BLADE SURG 15 STRL SS (BLADE) ×1
CHLORAPREP W/TINT 26 (MISCELLANEOUS) ×1 IMPLANT
CLAMP MUSCLE BIOPSY 12MM DISP (MISCELLANEOUS) ×1 IMPLANT
CNTNR SPEC 2.5X3XGRAD LEK (MISCELLANEOUS) ×2
CONT SPEC 4OZ STER OR WHT (MISCELLANEOUS) ×2
CONTAINER SPEC 2.5X3XGRAD LEK (MISCELLANEOUS) ×1 IMPLANT
DERMABOND ADVANCED .7 DNX12 (GAUZE/BANDAGES/DRESSINGS) ×1 IMPLANT
DRAPE LAPAROTOMY 77X122 PED (DRAPES) ×1 IMPLANT
ELECT REM PT RETURN 9FT ADLT (ELECTROSURGICAL) ×1
ELECTRODE REM PT RTRN 9FT ADLT (ELECTROSURGICAL) ×1 IMPLANT
GAUZE 4X4 16PLY ~~LOC~~+RFID DBL (SPONGE) ×1 IMPLANT
GAUZE SPONGE 4X4 12PLY STRL (GAUZE/BANDAGES/DRESSINGS) ×1 IMPLANT
GLOVE BIOGEL PI IND STRL 7.0 (GLOVE) ×1 IMPLANT
GLOVE SURG SYN 6.5 ES PF (GLOVE) ×4 IMPLANT
GLOVE SURG SYN 6.5 PF PI (GLOVE) ×1 IMPLANT
GOWN STRL REUS W/ TWL LRG LVL3 (GOWN DISPOSABLE) ×2 IMPLANT
GOWN STRL REUS W/TWL LRG LVL3 (GOWN DISPOSABLE) ×2
KIT TURNOVER KIT A (KITS) ×1 IMPLANT
LABEL OR SOLS (LABEL) ×1 IMPLANT
MANIFOLD NEPTUNE II (INSTRUMENTS) ×1 IMPLANT
NEEDLE HYPO 22GX1.5 SAFETY (NEEDLE) ×1 IMPLANT
NS IRRIG 500ML POUR BTL (IV SOLUTION) ×1 IMPLANT
PACK BASIN MINOR ARMC (MISCELLANEOUS) ×1 IMPLANT
SUT MNCRL 4-0 (SUTURE) ×1
SUT MNCRL 4-0 27XMFL (SUTURE) ×1
SUT VIC AB 3-0 SH 27 (SUTURE) ×1
SUT VIC AB 3-0 SH 27X BRD (SUTURE) ×1 IMPLANT
SUTURE MNCRL 4-0 27XMF (SUTURE) ×1 IMPLANT
SYR 10ML LL (SYRINGE) ×1 IMPLANT
TRAP FLUID SMOKE EVACUATOR (MISCELLANEOUS) ×1 IMPLANT
WATER STERILE IRR 500ML POUR (IV SOLUTION) ×1 IMPLANT

## 2021-12-16 NOTE — Interval H&P Note (Signed)
History and Physical Interval Note:  12/16/2021 9:26 AM  Shannon Chen  has presented today for surgery, with the diagnosis of Lower extremity weakness.  The various methods of treatment have been discussed with the patient and family. After consideration of risks, benefits and other options for treatment, the patient has consented to  Procedure(s): MUSCLE BIOPSY (N/A) as a surgical intervention.  The patient's history has been reviewed, patient examined, no change in status, stable for surgery.  I have reviewed the patient's chart and labs.  Questions were answered to the patient's satisfaction.   Will take specimen from quadriceps femoris.   Ronny Bacon

## 2021-12-16 NOTE — Plan of Care (Signed)
  Problem: Education: Goal: Knowledge of General Education information will improve Description Including pain rating scale, medication(s)/side effects and non-pharmacologic comfort measures Outcome: Progressing   Problem: Health Behavior/Discharge Planning: Goal: Ability to manage health-related needs will improve Outcome: Progressing   

## 2021-12-16 NOTE — Care Management Important Message (Signed)
Important Message  Patient Details  Name: Shannon Chen MRN: [id number] Date of Birth: August 21, 1945   Medicare Important Message Given:  Yes     [name] 12/16/2021, 12:18 PM

## 2021-12-16 NOTE — Op Note (Signed)
Left quadriceps muscle biopsy.  Pre-operative Diagnosis: Bilateral lower extremity weakness, myopathy Rhabdomyolysis.    Post-operative Diagnosis: same.    Surgeon: Ronny Bacon, M.D., FACS  Anesthesia: MAC with local  Findings: Unremarkable.  Estimated Blood Loss: 3 mL         Specimens: 2 specimens of the quadriceps for Morris from the left anterior thigh, 1 on clamp sent fresh, 1 cube sent on saline dampened Telfa.          Complications: none              Procedure Details  The patient was seen again in the Holding Room. The benefits, complications, treatment options, and expected outcomes were discussed with the patient. The risks of bleeding, infection, recurrence of symptoms, failure to resolve symptoms, unanticipated injury, prosthetic placement, prosthetic infection, any of which could require further surgery were reviewed with the patient. The likelihood of improving the patient's symptoms with return to their baseline status is hopeful.  The patient and/or family concurred with the proposed plan, giving informed consent.  The patient was taken to Operating Room, identified and the procedure verified.    Prior to the induction of general anesthesia, antibiotic prophylaxis was administered. VTE prophylaxis was in place.  MAC was then administered and tolerated well. After the induction, the patient was positioned in the supine position and the left anterior thigh was prepped with  Chloraprep and draped in the sterile fashion.  A Time Out was held and the above information confirmed. Local infiltration of the skin and subcutaneous tissues was completed with 1% lidocaine with epinephrine.  A longitudinal incision is made, and carried down through the subcutaneous tissues to the muscular fascia.  Hemostasis maintained with electrocautery. Fascia is incised longitudinally.  I then utilized the specimen clamp for the muscle, placed it on the muscle and then secured it tightly with  hemostat.  The muscle was then sharply excised with 5 mm plus margins to ensure adequate tenants of stretch within the clamp. A 1 cm cube of muscle was excised from the adjacent muscle, placed in a saline dampened Telfa and both specimens were sent urgently to pathology. Hemostasis was assured, the fascia was reapproximated with interrupted 3-0 Vicryl sutures.  The skin was closed with running 4-0 Monocryl suture, and sealed with Dermabond.  Patient tolerated procedure well.      Ronny Bacon M.D., [facility] 12/16/2021 11:19 AM

## 2021-12-16 NOTE — Anesthesia Postprocedure Evaluation (Signed)
Anesthesia Post Note  Patient: Shannon Chen  Procedure(s) Performed: MUSCLE BIOPSY (Left: Thigh)  Patient location during evaluation: PACU Anesthesia Type: General Level of consciousness: awake and alert, oriented and patient cooperative Pain management: pain level controlled Vital Signs Assessment: post-procedure vital signs reviewed and stable Respiratory status: spontaneous breathing, nonlabored ventilation and respiratory function stable Cardiovascular status: blood pressure returned to baseline and stable Postop Assessment: adequate PO intake Anesthetic complications: no   No notable events documented.   Last Vitals:  Vitals:   12/16/21 1132 12/16/21 1133  BP: 133/81 133/61  Pulse:  64  Resp:  14  Temp:  36.5 C  SpO2:  97%    Last Pain:  Vitals:   12/16/21 1133  TempSrc: Oral  PainSc:                  Darrin Nipper

## 2021-12-16 NOTE — Anesthesia Preprocedure Evaluation (Addendum)
Anesthesia Evaluation  Patient identified by MRN, date of birth, ID band Patient awake    Reviewed: Allergy & Precautions, NPO status , Patient's Chart, lab work & pertinent test results  History of Anesthesia Complications Negative for: history of anesthetic complications  Airway Mallampati: IV   Neck ROM: Full    Dental  (+) Poor Dentition   Pulmonary former smoker (quit 2020),    Pulmonary exam normal breath sounds clear to auscultation       Cardiovascular hypertension, + CAD and + Peripheral Vascular Disease (carotid stenosis s/p CEA on Plavix; common iliac artery aneurysm)  Normal cardiovascular exam+ dysrhythmias (Mobitz II AVB) + pacemaker  Rhythm:Regular Rate:Normal  HOCM, preserved EF  ECG 12/10/21: NSR, RBBB, new TWI, no STEMI  Cardiac catheterization for elevated troponin October 2022: Severely calcified and tortuous right coronary artery 70% stenosis, unable to pass catheter, small in nature and tortuous vessel, medical management recommended  Repeat catheterization November 2022 for altered mental status and elevated troponin, findings showing stable coronary disease Attempt to PCI RCA unsuccessful, medical management recommended   Neuro/Psych CVA (multiple), No Residual Symptoms    GI/Hepatic negative GI ROS,   Endo/Other  diabetes, Type 2  Renal/GU Renal disease (AKI on CKD)     Musculoskeletal  (+) Arthritis ,   Abdominal   Peds  Hematology negative hematology ROS (+)   Anesthesia Other Findings Inpatient cardiology note 12/13/21:  A/P: Preop cardiovascular evaluation No anginal symptoms, relatively recent cardiac catheterizations not amenable to intervention, medical management has been previously recommended No unstable anginal symptoms, Acceptable risk for vascular surgery History of LVH, would continue beta-blocker perioperatively Would continue Plavix per vascular No additional cardiac  testing needed at this time History of high degree heart block, status post pacemaker followed by EP, device functioning well -Given debility at baseline, underlying coronary disease, diffuse PAD, overall high risk for PV procedure and surgery on renal nodule   Outpatient cardiology note 11/12/21:  Assessment and  Plan  Mobitz 2 AVB  Pacemaker--Medtronic ParaHisian   Abnormal ECG-dynamic T wave changes  Syncope  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  Life is much better. Blood pressures have been much better.  Continue the amlodipine 2.5 and metoprolol 50.  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  No chest pain.  On rosuvastatin and aspirin.  Last creatinine had gone up about 25%.  We will recheck.  She has no cardiac contraindications to having her tooth extracted.  Right bundle branch block is stable  Normal device function.  30% ventricular pacing.  No interval syncope.  Reproductive/Obstetrics                            Anesthesia Physical Anesthesia Plan  ASA: 4  Anesthesia Plan: General   Post-op Pain Management:    Induction: Intravenous  PONV Risk Score and Plan: 3 and Ondansetron, Treatment may vary due to age or medical condition, TIVA and Midazolam  Airway Management Planned: Natural Airway  Additional Equipment:   Intra-op Plan:   Post-operative Plan:   Informed Consent: I have reviewed the patients History and Physical, chart, labs and discussed the procedure including the risks, benefits and alternatives for the proposed anesthesia with the  patient or authorized representative who has indicated his/her understanding and acceptance.     Dental advisory  given  Plan Discussed with: CRNA  Anesthesia Plan Comments: (LMA/GETA backup discussed.  Patient consented for risks of anesthesia including but not limited to:  - adverse reactions to medications - damage to eyes, teeth, lips or other oral mucosa - nerve damage due to positioning  - sore throat or hoarseness - damage to heart, brain, nerves, lungs, other parts of body or loss of life  Informed patient about role of CRNA in peri- and intra-operative care.  Patient voiced understanding.)       Anesthesia Quick Evaluation

## 2021-12-16 NOTE — Progress Notes (Signed)
PT Cancellation Note  Patient Details Name: OCEANIA NOORI MRN: [id number] DOB: 1945-12-15   Cancelled Treatment:    Reason Eval/Treat Not Completed: Patient at procedure or test/unavailable. Patient anticipated to have a muscle biopsy. PT will continue with attempts as appropriate.   Minna Merritts, PT, MPT  Percell Locus 12/16/2021, 10:35 AM

## 2021-12-16 NOTE — Progress Notes (Deleted)
NO SHOW, in hospital

## 2021-12-16 NOTE — Transfer of Care (Signed)
Immediate Anesthesia Transfer of Care Note  Patient: Shannon Chen  Procedure(s) Performed: MUSCLE BIOPSY (Left: Thigh)  Patient Location: PACU  Anesthesia Type:MAC  Level of Consciousness: drowsy  Airway & Oxygen Therapy: Patient Spontanous Breathing  Post-op Assessment: Report given to RN  Post vital signs: Reviewed  Last Vitals:  Vitals Value Taken Time  BP 183/73 12/16/21 1034  Temp 37 C 12/16/21 1034  Pulse 63 12/16/21 1035  Resp 11 12/16/21 1035  SpO2 96 % 12/16/21 1035  Vitals shown include unvalidated device data.  Last Pain:  Vitals:   12/16/21 1034  TempSrc: Tympanic  PainSc: 0-No pain         Complications: No notable events documented.

## 2021-12-16 NOTE — Progress Notes (Signed)
PROGRESS NOTE    Shannon Chen  TIR:[id number] DOB: 06/27/1945 DOA: 12/09/2021 PCP: Remi Haggard, FNP  205A/205A-AA  LOS: 6 days   Brief hospital course:   Assessment & Plan: Shannon Chen is a 76 year old with history of CAD, HOCM, AV block status post PPM, HTN, prior CVA admitted for bilateral lower extremity weakness..  About 5 days ago she started experiencing bilateral lower extremity weakness and difficulty walking.  In the ED she was found to have rhabdomyolysis.  CT of the head, cervical thoracic and lumbar spine did not show any acute pathology.  *Bilateral lower extremity weakness 2/2 myopathy Rhabdomyolysis.   Sudden onset about 5 days ago.  Reported numbness and weakness but no pain.  CT head, cervical, lumbar and thoracic spine are unremarkable.  Unable to get MRI due to pacemaker.  Neurology team consulted.  CK elevated at 6906, folate, B12 low normal, TSH normal.  CRP wnl, ESR only mildly elevated at 37. --Vit D 15 (low), which may contribute to proximal myopathy. -GGT normal, confirming that elevated AST/ALT are secondary to muscle injury and not hepatic injury --started on steroid 1 mg/kg dosing of prednisone. Plan: -Continue 65 mg daily prednisone for 4-6 weeks (until rheumatology follow-up), taper per outpatient provider and pending clinical course and workup  --cont protonix and Ca supplementation while on steroid -outpatient rheumatologist/neurologist to consider pneumocystis jiroveci prophylaxis if high dose steroids will be continued longer term -Pending: serum myoglobin, myositis panel, anti-HMG CoA reductase antibody, MMA  -cont Vitamin D repletion, 50,000 units weekly for 8 weeks --cont vit B12 supplement --Continue to hold statin --GenSurg for muscle biopsy to assess for PM (ok to obtain from quadriceps femoris, per neuro) today   Common iliac artery aneurysm  --enlarging as per current CT c/a/p --Vascular consulted, with Dr. Delana Meyer   --tentatively plan for stent graft next Wed  9 mm nodule in left kidney --interval increase in size.  Raises possibility for paraneoplastic syndrome as presented with PM. --Oncology consulted, with Dr. Tasia Catchings --CT renal protocal w/wo contrast showed the nodule to be benign.    Acute kidney injury superimposed on CKD ([name]) Baseline creatinine 0.9.  Admission creatinine 2.08, slowly improving with IV fluids, resolved now.   Hypertension - cont amlodipine and Toprol - Holding home finerenone due to AKI   Hypertrophic cardiomyopathy ([place]) Patient is euvolemic at this time.  She is chest pain-free with stable EKG and troponins within her baseline range   Type 2 diabetes mellitus with hyperlipidemia [facility]) She is on [place] for heart failure.  Hemoglobin A1c 6.4 --SSI while on steroid   Chronic obstructive pulmonary disease ([name]) --cont daily bronchodilators    DVT prophylaxis: Lovenox SQ Code Status: Full code  Family Communication: pt declined for me to update anyone Level of care: Med-Surg Dispo:   The patient is from: home Anticipated d/c is to: SNF rehab Anticipated d/c date is: undetermined   Subjective and Interval History:  Pt had muscle biopsy done today.  Reported anterior thigh sore afterwards.    Objective: Vitals:   12/16/21 1111 12/16/21 1132 12/16/21 1133 12/16/21 1544  BP: (!) 158/67 133/81 133/61 (!) 110/55  Pulse: 65  64 74  Resp: _0 Temp: (!) 97.5 F (36.4 C)  97.7 F (36.5 C) 97.8 F (36.6 C)  TempSrc: Oral  Oral Oral  SpO2: 95%  97% 96%  Weight:      Height:        Intake/Output Summary (Last 24  hours) at 12/16/2021 1614 Last data filed at 12/16/2021 1402 Gross per 24 hour  Intake 1470 ml  Output 250 ml  Net 1220 ml   Filed Weights   12/09/21 0952  Weight: 64.9 kg    Examination:   Constitutional: NAD, AAOx3 HEENT: conjunctivae and lids normal, EOMI CV: No cyanosis.   RESP: normal respiratory effort, on RA Neuro: II - XII  grossly intact.   Psych: Normal mood and affect.  Appropriate judgement and reason   Data Reviewed: I have personally reviewed labs and imaging studies  Time spent: 25 minutes  Enzo Bi, MD [facility] If 7PM-7AM, please contact night-coverage 12/16/2021, 4:14 PM

## 2021-12-16 NOTE — Progress Notes (Signed)
Occupational Therapy Treatment Patient Details Name: Shannon Chen MRN: [id number] DOB: [birth year] Today's Date: 12/16/2021   History of present illness Patient is a 76 year old female with medical history significant of CAD, HOCM, AV block s/p PPM, hypertension, hyperlipidemia, prior CVA who presents to the ED with complaints of bilateral lower extremity weakness and transient numbness in legs.   OT comments  Upon entering session, pt resting in bed. Pt reporting significant L anterior thigh pain (10/10) 2/2 muscle biopsy earlier today. Pt reported receiving pain meds recently, but stated "It hasn't touched the pain." RN aware. Pt deferred grooming tasks and completing transfer to sit up in recliner for dinner despite encouragement. Pt agreeable to allow OT to assist with repositioning in bed and placing a pillow under LLE in order to elevate it and prevent swelling. Pt completed bed mobility with Min A this date. Pt left as received with all needs in reach. OT will continue to follow acutely.     Recommendations for follow up therapy are one component of a multi-disciplinary discharge planning process, led by the attending physician.  Recommendations may be updated based on patient status, additional functional criteria and insurance authorization.    Follow Up Recommendations  Skilled nursing-short term rehab (<3 hours/day)    Assistance Recommended at Discharge Frequent or constant Supervision/Assistance  Patient can return home with the following  Direct supervision/assist for medications management;Help with stairs or ramp for entrance;Assistance with cooking/housework;A little help with walking and/or transfers;A little help with bathing/dressing/bathroom   Equipment Recommendations  Other (comment) (defer to next venue of care)    Recommendations for Other Services      Precautions / Restrictions Precautions Precautions: Fall Restrictions Weight Bearing Restrictions: No        Mobility Bed Mobility Overal bed mobility: Needs Assistance Bed Mobility: Rolling Rolling: Min assist              Transfers                   General transfer comment: pt deferred     Balance Overall balance assessment: Needs assistance     Sitting balance - Comments: pt deferred                                   ADL either performed or assessed with clinical judgement   ADL Overall ADL's : Needs assistance/impaired                                       General ADL Comments: pt deferred grooming tasks 2/2 LLE pain    Extremity/Trunk Assessment Upper Extremity Assessment Upper Extremity Assessment: Generalized weakness   Lower Extremity Assessment Lower Extremity Assessment: Generalized weakness        Vision Patient Visual Report: No change from baseline     Perception     Praxis      Cognition Arousal/Alertness: Awake/alert Behavior During Therapy: WFL for tasks assessed/performed Overall Cognitive Status: Within Functional Limits for tasks assessed                                          Exercises      Shoulder Instructions       General  Comments      Pertinent Vitals/ Pain       Pain Assessment Pain Assessment: 0-10 Pain Score: 10-Worst pain ever Pain Location: L anterior thigh Pain Intervention(s): Limited activity within patient's tolerance, Premedicated before session, Monitored during session, Repositioned  Home Living                                          Prior Functioning/Environment              Frequency  Min 2X/week        Progress Toward Goals  OT Goals(current goals can now be found in the care plan section)  Progress towards OT goals: Progressing toward goals  Acute Rehab OT Goals Patient Stated Goal: to go to rehab OT Goal Formulation: With patient/family Time For Goal Achievement: 12/24/21 Potential to Achieve Goals: [place] Discharge plan remains appropriate;Frequency remains appropriate    Co-evaluation                 AM-PAC OT "6 Clicks" Daily Activity     Outcome Measure   Help from another person eating meals?: A Little Help from another person taking care of personal grooming?: A Little Help from another person toileting, which includes using toliet, bedpan, or urinal?: A Lot Help from another person bathing (including washing, rinsing, drying)?: A Lot Help from another person to put on and taking off regular upper body clothing?: A Little Help from another person to put on and taking off regular lower body clothing?: A Lot 6 Click Score: 15    End of Session    OT Visit Diagnosis: Unsteadiness on feet (R26.81);Muscle weakness (generalized) (M62.81)   Activity Tolerance Patient limited by pain   Patient Left in bed;with call bell/phone within reach;with bed alarm set   Nurse Communication Mobility status        Time: [id number] OT Time Calculation (min): 11 min  Charges: OT General Charges $OT Visit: 1 Visit OT Treatments $Self Care/Home Management : 8-22 mins  [facility] MS, OTR/L ascom [phone]  12/16/21, 6:24 PM

## 2021-12-17 ENCOUNTER — Encounter: Payer: Self-pay | Admitting: Surgery

## 2021-12-17 DIAGNOSIS — I70223 Atherosclerosis of native arteries of extremities with rest pain, bilateral legs: Secondary | ICD-10-CM | POA: Diagnosis not present

## 2021-12-17 DIAGNOSIS — I714 Abdominal aortic aneurysm, without rupture, unspecified: Secondary | ICD-10-CM

## 2021-12-17 DIAGNOSIS — R531 Weakness: Secondary | ICD-10-CM | POA: Diagnosis not present

## 2021-12-17 DIAGNOSIS — I723 Aneurysm of iliac artery: Secondary | ICD-10-CM | POA: Diagnosis not present

## 2021-12-17 LAB — CBC
HCT: 29.9 % — ABNORMAL LOW (ref 36.0–46.0)
Hemoglobin: 9.4 g/dL — ABNORMAL LOW (ref 12.0–15.0)
MCH: 26.6 pg (ref 26.0–34.0)
MCHC: 31.4 g/dL (ref 30.0–36.0)
MCV: 84.7 fL (ref 80.0–100.0)
Platelets: 199 10*3/uL (ref 150–400)
RBC: 3.53 MIL/uL — ABNORMAL LOW (ref 3.87–5.11)
RDW: 16.2 % — ABNORMAL HIGH (ref 11.5–15.5)
WBC: 13.2 10*3/uL — ABNORMAL HIGH (ref 4.0–10.5)
nRBC: 0 % (ref 0.0–0.2)

## 2021-12-17 LAB — BASIC METABOLIC PANEL
Anion gap: 7 (ref 5–15)
BUN: 56 mg/dL — ABNORMAL HIGH (ref 8–23)
CO2: 26 mmol/L (ref 22–32)
Calcium: 8.4 mg/dL — ABNORMAL LOW (ref 8.9–10.3)
Chloride: 106 mmol/L (ref 98–111)
Creatinine, Ser: 1.25 mg/dL — ABNORMAL HIGH (ref 0.44–1.00)
GFR, Estimated: 44 mL/min — ABNORMAL LOW (ref >60)
Glucose, Bld: 130 mg/dL — ABNORMAL HIGH (ref 70–99)
Potassium: 4.1 mmol/L (ref 3.5–5.1)
Sodium: 139 mmol/L (ref 135–145)

## 2021-12-17 LAB — GLUCOSE, CAPILLARY
Glucose-Capillary: 102 mg/dL — ABNORMAL HIGH (ref 70–99)
Glucose-Capillary: 192 mg/dL — ABNORMAL HIGH (ref 70–99)
Glucose-Capillary: 198 mg/dL — ABNORMAL HIGH (ref 70–99)
Glucose-Capillary: 192 mg/dL — ABNORMAL HIGH (ref 70–99)

## 2021-12-17 LAB — MAGNESIUM: Magnesium: 2 mg/dL (ref 1.7–2.4)

## 2021-12-17 MED ORDER — SODIUM CHLORIDE 0.9 % IV SOLN: INTRAVENOUS | Status: DC

## 2021-12-17 MED ORDER — CEFAZOLIN SODIUM-DEXTROSE 2-4 GM/100ML-% IV SOLN
2.0000 g | INTRAVENOUS | Status: AC
  Administered 2021-12-18: 2 g via INTRAVENOUS
  Filled 2021-12-17: qty 100

## 2021-12-17 MED ORDER — HYDROCODONE-ACETAMINOPHEN 5-325 MG PO TABS
1.0000 | ORAL_TABLET | Freq: Four times a day (QID) | ORAL | Status: DC | PRN
  Administered 2021-12-17 – 2021-12-18 (×3): 1 via ORAL
  Filled 2021-12-17 (×3): qty 1

## 2021-12-17 MED ORDER — SODIUM CHLORIDE 0.9 % IV SOLN: INTRAVENOUS | Status: AC | PRN

## 2021-12-17 NOTE — Progress Notes (Signed)
PT Cancellation Note  Patient Details Name: Shannon Chen MRN: [id number] DOB: Mar 15, 1945   Cancelled Treatment:    Reason Eval/Treat Not Completed: Pain limiting ability to participate (Patient refusing due to pain in her left thigh following muscle biopsy despite encouragement. She reports she had Tylenol already. PT will continue with attempts)  Minna Merritts, PT, MPT   Percell Locus 12/17/2021, 11:31 AM

## 2021-12-17 NOTE — TOC Progression Note (Signed)
Transition of Care [facility]) - Progression Note    Patient Details  Name: Shannon Chen MRN: [id number] Date of Birth: 1945/06/28  Transition of Care [facility]) CM/SW Contact  Beverly Sessions, RN Phone Number: 12/17/2021, 2:52 PM  Clinical Narrative:     Prior to discharge patient will require auth for SNF.  Message sent to MD to determine when it is anticipated that patient will be medically stable for discharge        Expected Discharge Plan and Services                                                 Social Determinants of Health (SDOH) Interventions    Readmission Risk Interventions    12/10/2021    2:16 PM 12/15/2020    4:10 PM  Readmission Risk Prevention Plan  Transportation Screening Complete Complete  PCP or Specialist Appt within 5-7 Days  Complete  Home Care Screening  Complete  Medication Review (RN CM)  Complete  HRI or Home Care Consult Complete   Social Work Consult for [place] Planning/Counseling Complete   Palliative Care Screening Not Applicable   Medication Review [employer] photographer) Complete

## 2021-12-17 NOTE — H&P (View-Only) (Signed)
[facility]  Daily Progress Note   Subjective  -   Patient continues to have pain in her extremities but there are no new abdominal or back related issues.  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  Objective Vitals:   12/16/21 2010 12/17/21 0455 12/17/21 0738 12/17/21 1506  BP: (!) 141/67 137/76 (!) 153/58 (!) 130/57  Pulse: 69 66 64 66  Resp: 18 18 16 18  Temp: 97.6 F (36.4 C) 98.1 F (36.7 C) 97.7 F (36.5 C) 98 F (36.7 C)  TempSrc: Oral Oral Oral Oral  SpO2: 96% 97% 99% 97%  Weight:      Height:        Intake/Output Summary (Last 24 hours) at 12/17/2021 1836 Last data filed at 12/17/2021 0113 Gross per 24 hour  Intake 300 ml  Output 500 ml  Net -200 ml    PULM  Normal effort , no use of accessory muscles CV  No JVD, RRR Abd      No distended, nontender VASC  palpable femoral pulses  Laboratory CBC    Component Value Date/Time   WBC 13.2 (H) 12/17/2021 0434   HGB 9.4 (L) 12/17/2021 0434   HCT 29.9 (L) 12/17/2021 0434   PLT 199 12/17/2021 0434    BMET    Component Value Date/Time   NA 139 12/17/2021 0434   K 4.1 12/17/2021 0434   CL 106 12/17/2021 0434   CO2 26 12/17/2021 0434   GLUCOSE 130 (H) 12/17/2021 0434   BUN 56 (H) 12/17/2021 0434   CREATININE 1.25 (H) 12/17/2021 0434   CALCIUM 8.4 (L) 12/17/2021 0434   GFRNONAA 44 (L) 12/17/2021 0434   GFRAA >60 11/18/2019 0619    Assessment/Planning: 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   This places her at a very high risk for lethal rupture primarily from the left common iliac artery aneurysm.  The patient is a candidate for endovascular repair.   The patient has cardiac clearance prior to stent graft placement.  Therefore, we will plan to repair with a stent graft tomorrow December 18, 2021  The patient will continue antiplatelet therapy as prescribed (since the patient is undergoing endovascular repair as opposed to open repair) as well as aggressive management of hyperlipidemia. Exercise is again strongly encouraged.   The patient is reminded that lifetime routine surveillance is a necessity with an endograft.   The risks and benefits of AAA repair are reviewed with the patient.  All questions are answered.  Alternative therapies are also discussed.  The patient agrees to proceed with endovascular aneurysm repair.  Patient will follow-up with me in the office after the surgery.    Karanvir Balderston  12/17/2021, 6:36 PM      

## 2021-12-17 NOTE — Progress Notes (Addendum)
[facility]  Daily Progress Note   Subjective  -   Patient continues to have pain in her extremities but there are no new abdominal or back related issues.  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  Objective Vitals:   12/16/21 2010 12/17/21 0455 12/17/21 0738 12/17/21 1506  BP: (!) 141/67 137/76 (!) 153/58 (!) 130/57  Pulse: 69 66 64 66  Resp: 18 18 16 18   Temp: 97.6 F (36.4 C) 98.1 F (36.7 C) 97.7 F (36.5 C) 98 F (36.7 C)  TempSrc: Oral Oral Oral Oral  SpO2: 96% 97% 99% 97%  Weight:      Height:        Intake/Output Summary (Last 24 hours) at 12/17/2021 1836 Last data filed at 12/17/2021 0113 Gross per 24 hour  Intake 300 ml  Output 500 ml  Net -200 ml    PULM  Normal effort , no use of accessory muscles CV  No JVD, RRR Abd      No distended, nontender VASC  palpable femoral pulses  Laboratory CBC    Component Value Date/Time   WBC 13.2 (H) 12/17/2021 0434   HGB 9.4 (L) 12/17/2021 0434   HCT 29.9 (L) 12/17/2021 0434   PLT 199 12/17/2021 0434    BMET    Component Value Date/Time   NA 139 12/17/2021 0434   K 4.1 12/17/2021 0434   CL 106 12/17/2021 0434   CO2 26 12/17/2021 0434   GLUCOSE 130 (H) 12/17/2021 0434   BUN 56 (H) 12/17/2021 0434   CREATININE 1.25 (H) 12/17/2021 0434   CALCIUM 8.4 (L) 12/17/2021 0434   GFRNONAA 44 (L) 12/17/2021 0434   GFRAA >60 11/18/2019 6269    Assessment/Planning: 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   This places her at a very high risk for lethal rupture primarily from the left common iliac artery aneurysm.  The patient is a candidate for endovascular repair.   The patient has cardiac clearance prior to stent graft placement.  Therefore, we will plan to repair with a stent graft tomorrow December 18, 2021  The patient will continue antiplatelet therapy as prescribed (since the patient is undergoing endovascular repair as opposed to open repair) as well as aggressive management of hyperlipidemia. Exercise is again strongly encouraged.   The patient is reminded that lifetime routine surveillance is a necessity with an endograft.   The risks and benefits of AAA repair are reviewed with the patient.  All questions are answered.  Alternative therapies are also discussed.  The patient agrees to proceed with endovascular aneurysm repair.  Patient will follow-up with me in the office after the surgery.    [name]  12/17/2021, 6:36 PM

## 2021-12-17 NOTE — Progress Notes (Deleted)
Discharge instructions were reviewed with pt and his family. IV was taken out. RN answered questions. Extra supplies were given to pt for ostomy care. Belongings were collected by pt's family.   

## 2021-12-17 NOTE — Progress Notes (Signed)
Mobility Specialist - Progress Note    12/17/21 1524  Mobility  Activity Refused mobility   Second attempt today. Pt refused due to pain in left leg. Will attempt another day and time.   Loma Sender Mobility Specialist 12/17/21, 3:25 PM

## 2021-12-17 NOTE — Progress Notes (Signed)
End of shift note:  Pt received prn tylenol and norco for leg pain. Surgeon suggested to place am ace wrap to help with the swelling. The plan is for pt to be NPO after midnight to do an aorta iliac artery aneurysm repair.

## 2021-12-17 NOTE — Progress Notes (Signed)
\ PROGRESS NOTE    Shannon Chen  EVO:[id number] DOB: [birth year] DOA: 12/09/2021 PCP: Remi Haggard, FNP  205A/205A-AA  LOS: 7 days   Brief hospital course:   Assessment & Plan: Shannon Chen is a 76 year old with history of CAD, HOCM, AV block status post PPM, HTN, prior CVA admitted for bilateral lower extremity weakness..  About 5 days PTA she started experiencing bilateral lower extremity weakness and difficulty walking.  In the ED she was found to have rhabdomyolysis.  CT of the head, cervical thoracic and lumbar spine did not show any acute pathology.  Neuro consulted, due to concern for myopathy as paraneoplastic syndromes, CT chest/abdomen/pelvis w/ contrast performed for malignancy screening which found incidental vascular renal findings as below.  *Bilateral lower extremity weakness 2/2 myopathy Rhabdomyolysis.   Sudden onset about 5 days PTA.  Reported numbness and weakness but no pain.  CT head, cervical, lumbar and thoracic spine are unremarkable.  Unable to get MRI due to pacemaker.  Neurology consulted with Dr. Curly Shores.  CK elevated at 6906, folate, B12 low normal, TSH normal.  CRP wnl, ESR only mildly elevated at 37. --Vit D 15 (low), which may contribute to proximal myopathy. -GGT normal, confirming that elevated AST/ALT are secondary to muscle injury and not hepatic injury --started on steroid 1 mg/kg dosing of prednisone. -Continue 65 mg daily prednisone for 4-6 weeks (until rheumatology follow-up), taper per outpatient provider and pending clinical course and workup  --cont protonix and Ca supplementation while on steroid -outpatient rheumatologist/neurologist to consider pneumocystis jiroveci prophylaxis if high dose steroids will be continued longer term -Pending: serum myoglobin, myositis panel, anti-HMG CoA reductase antibody, MMA  -cont Vitamin D repletion, 50,000 units weekly for 8 weeks --cont vit B12 supplement --Continue to hold statin --f/u muscle  biopsy result to assess for PM.   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  This places her at a very high risk for lethal rupture primarily from the left common iliac artery aneurysm.  The patient is a candidate for endovascular repair.  --Stent graft placement with Dr. 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 Delana Meyer, will need to wait 1-2 months after the abdominal repair to minimize the risk of paraplegia  9 mm nodule in left kidney --interval increase in size.  Raises possibility for paraneoplastic syndrome as presented with PM, however, CT renal protocal w/wo contrast showed the nodule to be benign.  --Oncology consulted, with Dr. Tasia Catchings  Acute kidney injury superimposed on CKD ([name]) Baseline creatinine 0.9.  Admission creatinine 2.08, slowly improving with IV fluids, resolved now.   Hypertension - cont amlodipine and Toprol - Holding home finerenone due to AKI   Hypertrophic cardiomyopathy ([name]) Patient is euvolemic at this time.  She is chest pain-free with stable EKG and troponins within her baseline range   Type 2 diabetes mellitus with hyperlipidemia [facility]) She is on [place] for heart failure.  Hemoglobin A1c 6.4 --SSI while on steroid   Chronic obstructive pulmonary disease ([name]) --cont daily bronchodilators    DVT prophylaxis: Lovenox SQ Code Status: Full code  Family Communication: pt declined for me to update anyone Level of care: Med-Surg Dispo:   The patient is from: home Anticipated d/c is to: SNF rehab Anticipated d/c  date is: possible 11/2 after vascular surgery clears   Subjective and Interval History:  Pt continued to report pain in her muscle biopsy site.     Objective: Vitals:    12/16/21 2010 12/17/21 0455 12/17/21 0738 12/17/21 1506  BP: (!) 141/67 137/76 (!) 153/58 (!) 130/57  Pulse: 69 66 64 66  Resp: _0 Temp: 97.6 F (36.4 C) 98.1 F (36.7 C) 97.7 F (36.5 C) 98 F (36.7 C)  TempSrc: Oral Oral Oral Oral  SpO2: 96% 97% 99% 97%  Weight:      Height:        Intake/Output Summary (Last 24 hours) at 12/17/2021 1934 Last data filed at 12/17/2021 0113 Gross per 24 hour  Intake 60 ml  Output 500 ml  Net -440 ml   Filed Weights   12/09/21 0952  Weight: 64.9 kg    Examination:   Constitutional: NAD, AAOx3 HEENT: conjunctivae and lids normal, EOMI CV: No cyanosis.   RESP: normal respiratory effort, on RA Extremities: incision site over left anterior thigh tender to palpation SKIN: warm, dry Neuro: II - XII grossly intact.   Psych: Normal mood and affect.  Appropriate judgement and reason   Data Reviewed: I have personally reviewed labs and imaging studies  Time spent: 35 minutes  Enzo Bi, MD [facility] If 7PM-7AM, please contact night-coverage 12/17/2021, 7:34 PM

## 2021-12-17 NOTE — Progress Notes (Signed)
[facility] Day(s): 7.   Post op day(s): 1 Day Post-Op.   Interval History:  Patient seen and examined No acute events or new complaints overnight.  Patient reports soreness at left thigh incision; no drainage No fever, chills  Pathology pending   Vital signs in last 24 hours: [min-max] current  Temp:  [97.4 F (36.3 C)-98.6 F (37 C)] 98.1 F (36.7 C) (10/31 0455) Pulse Rate:  [61-74] 66 (10/31 0455) Resp:  [10-19] 18 (10/31 0455) BP: (110-183)/(55-81) 137/76 (10/31 0455) SpO2:  [94 %-98 %] 97 % (10/31 0455)     Height: 5' 5.5" (166.4 cm) Weight: 64.9 kg BMI (Calculated): 23.43   Intake/Output last 2 shifts:  10/30 0701 - 10/31 0700 In: 1590 [P.O.:540; I.V.:1050] Out: 600 [Urine:600]   Physical Exam:  Constitutional: alert, cooperative and no distress  Respiratory: breathing non-labored at rest  Integumentary: left anterior thigh incision is CDI with dermabond, no erythema, no drainage   Labs:     Latest Ref Rng & Units 12/17/2021    4:34 AM 12/16/2021    4:44 AM 12/15/2021    5:22 AM  CBC  WBC 4.0 - 10.5 K/uL 13.2  12.1  10.8   Hemoglobin 12.0 - 15.0 g/dL 9.4  10.8  10.7   Hematocrit 36.0 - 46.0 % 29.9  34.3  33.2   Platelets 150 - 400 K/uL 199  212  213       Latest Ref Rng & Units 12/17/2021    4:34 AM 12/16/2021    4:44 AM 12/15/2021    5:22 AM  CMP  Glucose 70 - 99 mg/dL 130  99  94   BUN 8 - 23 mg/dL 56  47  44   Creatinine 0.44 - 1.00 mg/dL 1.25  0.96  1.03   Sodium 135 - 145 mmol/L 139  141  142   Potassium 3.5 - 5.1 mmol/L 4.1  3.9  3.6   Chloride 98 - 111 mmol/L 106  107  108   CO2 22 - 32 mmol/L 26  24  25    Calcium 8.9 - 10.3 mg/dL 8.4  9.0  8.8      Imaging studies: No new pertinent imaging studies   Assessment/Plan:  76 y.o. female 1 Day Post-Op s/p muscle biopsy of left quadriceps muscle for bilateral lower extremity weakness, myopathy, and rhabdomyolysis    - Follow up  pathology  - Will update wound care instructions; okay to bath/shower tomorrow (11/01)  - Pain control prn  - Further management per primary service  - General surgery will sign off; Please call with questions/concerns   All of the above findings and recommendations were discussed with the patient, and the medical team, and all of patient's questions were answered to her expressed satisfaction.  -- Edison Simon, PA-C [facility] 12/17/2021, 7:06 AM M-F: 7am - 4pm

## 2021-12-18 ENCOUNTER — Encounter
Admission: EM | Disposition: A | Discharge status: Skilled Nursing Facility | Payer: Self-pay | Source: Home / Self Care | Attending: Hospitalist

## 2021-12-18 ENCOUNTER — Inpatient Hospital Stay: Payer: Medicare HMO | Admitting: Anesthesiology

## 2021-12-18 DIAGNOSIS — M3320 Polymyositis, organ involvement unspecified: Secondary | ICD-10-CM | POA: Diagnosis not present

## 2021-12-18 DIAGNOSIS — I723 Aneurysm of iliac artery: Secondary | ICD-10-CM | POA: Diagnosis not present

## 2021-12-18 DIAGNOSIS — I70203 Unspecified atherosclerosis of native arteries of extremities, bilateral legs: Secondary | ICD-10-CM

## 2021-12-18 DIAGNOSIS — R531 Weakness: Secondary | ICD-10-CM | POA: Diagnosis not present

## 2021-12-18 DIAGNOSIS — I70223 Atherosclerosis of native arteries of extremities with rest pain, bilateral legs: Secondary | ICD-10-CM

## 2021-12-18 DIAGNOSIS — I714 Abdominal aortic aneurysm, without rupture, unspecified: Secondary | ICD-10-CM | POA: Diagnosis not present

## 2021-12-18 HISTORY — PX: ENDOVASCULAR REPAIR/STENT GRAFT: CATH118280

## 2021-12-18 LAB — CBC
HCT: 29.1 % — ABNORMAL LOW (ref 36.0–46.0)
HCT: 28.4 % — ABNORMAL LOW (ref 36.0–46.0)
Hemoglobin: 9 g/dL — ABNORMAL LOW (ref 12.0–15.0)
Hemoglobin: 8.9 g/dL — ABNORMAL LOW (ref 12.0–15.0)
MCH: 27.2 pg (ref 26.0–34.0)
MCH: 29.4 pg (ref 26.0–34.0)
MCHC: 30.9 g/dL (ref 30.0–36.0)
MCHC: 31.3 g/dL (ref 30.0–36.0)
MCV: 87.9 fL (ref 80.0–100.0)
MCV: 93.7 fL (ref 80.0–100.0)
Platelets: 201 10*3/uL (ref 150–400)
Platelets: 121 10*3/uL — ABNORMAL LOW (ref 150–400)
RBC: 3.31 MIL/uL — ABNORMAL LOW (ref 3.87–5.11)
RBC: 3.03 MIL/uL — ABNORMAL LOW (ref 3.87–5.11)
RDW: 16.4 % — ABNORMAL HIGH (ref 11.5–15.5)
RDW: 14.3 % (ref 11.5–15.5)
WBC: 10.6 10*3/uL — ABNORMAL HIGH (ref 4.0–10.5)
WBC: 16.4 10*3/uL — ABNORMAL HIGH (ref 4.0–10.5)
nRBC: 0 % (ref 0.0–0.2)
nRBC: 0.1 % (ref 0.0–0.2)

## 2021-12-18 LAB — BASIC METABOLIC PANEL
Anion gap: 5 (ref 5–15)
Anion gap: 8 (ref 5–15)
Anion gap: 8 (ref 5–15)
BUN: 52 mg/dL — ABNORMAL HIGH (ref 8–23)
BUN: 42 mg/dL — ABNORMAL HIGH (ref 8–23)
BUN: 44 mg/dL — ABNORMAL HIGH (ref 8–23)
CO2: 25 mmol/L (ref 22–32)
CO2: 16 mmol/L — ABNORMAL LOW (ref 22–32)
CO2: 22 mmol/L (ref 22–32)
Calcium: 8.5 mg/dL — ABNORMAL LOW (ref 8.9–10.3)
Calcium: 6.6 mg/dL — ABNORMAL LOW (ref 8.9–10.3)
Calcium: 6.9 mg/dL — ABNORMAL LOW (ref 8.9–10.3)
Chloride: 108 mmol/L (ref 98–111)
Chloride: 114 mmol/L — ABNORMAL HIGH (ref 98–111)
Chloride: 112 mmol/L — ABNORMAL HIGH (ref 98–111)
Creatinine, Ser: 1.18 mg/dL — ABNORMAL HIGH (ref 0.44–1.00)
Creatinine, Ser: 1.11 mg/dL — ABNORMAL HIGH (ref 0.44–1.00)
Creatinine, Ser: 0.99 mg/dL (ref 0.44–1.00)
GFR, Estimated: 47 mL/min — ABNORMAL LOW (ref >60)
GFR, Estimated: 51 mL/min — ABNORMAL LOW (ref >60)
GFR, Estimated: 58 mL/min — ABNORMAL LOW (ref >60)
Glucose, Bld: 89 mg/dL (ref 70–99)
Glucose, Bld: 278 mg/dL — ABNORMAL HIGH (ref 70–99)
Glucose, Bld: 168 mg/dL — ABNORMAL HIGH (ref 70–99)
Potassium: 4.1 mmol/L (ref 3.5–5.1)
Potassium: 4.9 mmol/L (ref 3.5–5.1)
Potassium: 4.6 mmol/L (ref 3.5–5.1)
Sodium: 138 mmol/L (ref 135–145)
Sodium: 138 mmol/L (ref 135–145)
Sodium: 142 mmol/L (ref 135–145)

## 2021-12-18 LAB — LACTIC ACID, PLASMA
Lactic Acid, Venous: 3.1 mmol/L (ref 0.5–1.9)
Lactic Acid, Venous: 3.4 mmol/L (ref 0.5–1.9)

## 2021-12-18 LAB — GLUCOSE, CAPILLARY
Glucose-Capillary: 85 mg/dL (ref 70–99)
Glucose-Capillary: 145 mg/dL — ABNORMAL HIGH (ref 70–99)

## 2021-12-18 LAB — HEMOGLOBIN AND HEMATOCRIT, BLOOD
HCT: 29.5 % — ABNORMAL LOW (ref 36.0–46.0)
Hemoglobin: 9.8 g/dL — ABNORMAL LOW (ref 12.0–15.0)

## 2021-12-18 LAB — MAGNESIUM
Magnesium: 2.1 mg/dL (ref 1.7–2.4)
Magnesium: 1.8 mg/dL (ref 1.7–2.4)

## 2021-12-18 LAB — PREPARE RBC (CROSSMATCH)

## 2021-12-18 SURGERY — ENDOVASCULAR STENT GRAFT (AAA)
Anesthesia: General

## 2021-12-18 MED ORDER — SODIUM CHLORIDE 0.9 % IV SOLN
10.0000 mL/h | Freq: Once | INTRAVENOUS | Status: AC
  Administered 2021-12-18: 10 mL/h via INTRAVENOUS

## 2021-12-18 MED ORDER — SODIUM BICARBONATE 8.4 % IV SOLN
50.0000 meq | Freq: Once | INTRAVENOUS | Status: AC
  Administered 2021-12-18: 50 meq via INTRAVENOUS

## 2021-12-18 MED ORDER — PROPOFOL 500 MG/50ML IV EMUL
INTRAVENOUS | Status: DC | PRN
  Administered 2021-12-18: 30 ug/kg/min via INTRAVENOUS

## 2021-12-18 MED ORDER — OXYCODONE HCL 5 MG PO TABS: 5.0000 mg | ORAL_TABLET | Freq: Once | ORAL | Status: DC | PRN

## 2021-12-18 MED ORDER — POTASSIUM CHLORIDE CRYS ER 20 MEQ PO TBCR: 20.0000 meq | EXTENDED_RELEASE_TABLET | Freq: Every day | ORAL | Status: DC | PRN

## 2021-12-18 MED ORDER — LABETALOL HCL 5 MG/ML IV SOLN: 10.0000 mg | INTRAVENOUS | Status: DC | PRN

## 2021-12-18 MED ORDER — PANTOPRAZOLE SODIUM 40 MG IV SOLR
40.0000 mg | Freq: Every day | INTRAVENOUS | Status: DC
  Administered 2021-12-18 – 2021-12-19 (×2): 40 mg via INTRAVENOUS
  Filled 2021-12-18 (×2): qty 10

## 2021-12-18 MED ORDER — ACETAMINOPHEN 650 MG RE SUPP: 325.0000 mg | RECTAL | Status: DC | PRN

## 2021-12-18 MED ORDER — OXYCODONE-ACETAMINOPHEN 5-325 MG PO TABS
1.0000 | ORAL_TABLET | ORAL | Status: DC | PRN
  Administered 2021-12-20: 2 via ORAL
  Administered 2021-12-23: 1 via ORAL
  Filled 2021-12-18: qty 1
  Filled 2021-12-18: qty 2

## 2021-12-18 MED ORDER — ALUM & MAG HYDROXIDE-SIMETH 200-200-20 MG/5ML PO SUSP: 15.0000 mL | ORAL | Status: DC | PRN

## 2021-12-18 MED ORDER — SODIUM CHLORIDE 0.9 % IV SOLN: INTRAVENOUS | Status: DC

## 2021-12-18 MED ORDER — LACTATED RINGERS IV SOLN: INTRAVENOUS | Status: DC

## 2021-12-18 MED ORDER — SODIUM BICARBONATE 8.4 % IV SOLN
50.0000 meq | Freq: Once | INTRAVENOUS | Status: AC
  Filled 2021-12-18: qty 50

## 2021-12-18 MED ORDER — FENTANYL CITRATE (PF) 100 MCG/2ML IJ SOLN
INTRAMUSCULAR | Status: AC
  Administered 2021-12-18: 25 ug via INTRAVENOUS
  Filled 2021-12-18: qty 2

## 2021-12-18 MED ORDER — LACTATED RINGERS IV BOLUS
1000.0000 mL | Freq: Once | INTRAVENOUS | Status: AC
  Administered 2021-12-18: 1000 mL via INTRAVENOUS

## 2021-12-18 MED ORDER — ACETAMINOPHEN 10 MG/ML IV SOLN: 1000.0000 mg | Freq: Once | INTRAVENOUS | Status: DC | PRN

## 2021-12-18 MED ORDER — FENTANYL CITRATE (PF) 100 MCG/2ML IJ SOLN
INTRAMUSCULAR | Status: AC
  Filled 2021-12-18: qty 2

## 2021-12-18 MED ORDER — VASOPRESSIN 20 UNIT/ML IV SOLN
INTRAVENOUS | Status: DC | PRN
  Administered 2021-12-18 (×5): 2 [IU] via INTRAVENOUS

## 2021-12-18 MED ORDER — DOPAMINE-DEXTROSE 3.2-5 MG/ML-% IV SOLN: 3.0000 ug/kg/min | INTRAVENOUS | Status: DC

## 2021-12-18 MED ORDER — PHENYLEPHRINE HCL-NACL 20-0.9 MG/250ML-% IV SOLN
25.0000 ug/min | INTRAVENOUS | Status: DC
  Administered 2021-12-18: 25 ug/min via INTRAVENOUS
  Administered 2021-12-19: 20 ug/min via INTRAVENOUS
  Administered 2021-12-19: 45 ug/min via INTRAVENOUS
  Administered 2021-12-19: 40 ug/min via INTRAVENOUS
  Filled 2021-12-18 (×4): qty 250

## 2021-12-18 MED ORDER — OXYCODONE HCL 5 MG/5ML PO SOLN: 5.0000 mg | Freq: Once | ORAL | Status: DC | PRN

## 2021-12-18 MED ORDER — FENTANYL CITRATE (PF) 100 MCG/2ML IJ SOLN
25.0000 ug | INTRAMUSCULAR | Status: DC | PRN
  Administered 2021-12-18: 25 ug via INTRAVENOUS

## 2021-12-18 MED ORDER — CHLORHEXIDINE GLUCONATE CLOTH 2 % EX PADS
6.0000 | MEDICATED_PAD | Freq: Every day | CUTANEOUS | Status: DC
  Administered 2021-12-18 – 2021-12-23 (×4): 6 via TOPICAL

## 2021-12-18 MED ORDER — SODIUM CHLORIDE 0.9 % IV BOLUS
500.0000 mL | Freq: Once | INTRAVENOUS | Status: AC
  Administered 2021-12-18: 500 mL via INTRAVENOUS

## 2021-12-18 MED ORDER — DOPAMINE-DEXTROSE 3.2-5 MG/ML-% IV SOLN
INTRAVENOUS | Status: AC
  Administered 2021-12-18: 3 ug/kg/min via INTRAVENOUS
  Filled 2021-12-18: qty 250

## 2021-12-18 MED ORDER — PHENOL 1.4 % MT LIQD: 1.0000 | OROMUCOSAL | Status: DC | PRN

## 2021-12-18 MED ORDER — LACTATED RINGERS IV BOLUS
500.0000 mL | Freq: Once | INTRAVENOUS | Status: AC
  Administered 2021-12-18: 500 mL via INTRAVENOUS

## 2021-12-18 MED ORDER — LABETALOL HCL 5 MG/ML IV SOLN
INTRAVENOUS | Status: DC | PRN
  Administered 2021-12-18: 5 mg via INTRAVENOUS

## 2021-12-18 MED ORDER — PROPOFOL 10 MG/ML IV BOLUS
INTRAVENOUS | Status: AC
  Filled 2021-12-18: qty 20

## 2021-12-18 MED ORDER — ONDANSETRON HCL 4 MG/2ML IJ SOLN: 4.0000 mg | Freq: Four times a day (QID) | INTRAMUSCULAR | Status: DC | PRN

## 2021-12-18 MED ORDER — DOCUSATE SODIUM 100 MG PO CAPS
100.0000 mg | ORAL_CAPSULE | Freq: Every day | ORAL | Status: DC
  Administered 2021-12-19 – 2021-12-23 (×5): 100 mg via ORAL
  Filled 2021-12-18 (×5): qty 1

## 2021-12-18 MED ORDER — DEXMEDETOMIDINE HCL IN NACL 80 MCG/20ML IV SOLN
INTRAVENOUS | Status: DC | PRN
  Administered 2021-12-18: 4 ug via BUCCAL

## 2021-12-18 MED ORDER — MIDODRINE HCL 5 MG PO TABS
10.0000 mg | ORAL_TABLET | Freq: Three times a day (TID) | ORAL | Status: DC
  Administered 2021-12-18 – 2021-12-20 (×7): 10 mg via ORAL
  Filled 2021-12-18 (×8): qty 2

## 2021-12-18 MED ORDER — CEFAZOLIN SODIUM-DEXTROSE 2-4 GM/100ML-% IV SOLN
2.0000 g | Freq: Three times a day (TID) | INTRAVENOUS | Status: AC
  Administered 2021-12-18 – 2021-12-19 (×2): 2 g via INTRAVENOUS
  Filled 2021-12-18 (×2): qty 100

## 2021-12-18 MED ORDER — SODIUM CHLORIDE 0.9 % IV SOLN
250.0000 mL | INTRAVENOUS | Status: DC
  Administered 2021-12-19: 250 mL via INTRAVENOUS

## 2021-12-18 MED ORDER — CEFAZOLIN SODIUM-DEXTROSE 2-4 GM/100ML-% IV SOLN
INTRAVENOUS | Status: AC
  Filled 2021-12-18: qty 100

## 2021-12-18 MED ORDER — SODIUM BICARBONATE 8.4 % IV SOLN
INTRAVENOUS | Status: AC
  Administered 2021-12-18: 50 meq via INTRAVENOUS
  Filled 2021-12-18: qty 50

## 2021-12-18 MED ORDER — EPHEDRINE SULFATE (PRESSORS) 50 MG/ML IJ SOLN
INTRAMUSCULAR | Status: DC | PRN
  Administered 2021-12-18: 10 mg via INTRAVENOUS
  Administered 2021-12-18: 5 mg via INTRAVENOUS
  Administered 2021-12-18: 10 mg via INTRAVENOUS

## 2021-12-18 MED ORDER — ONDANSETRON HCL 4 MG/2ML IJ SOLN: 4.0000 mg | Freq: Once | INTRAMUSCULAR | Status: DC | PRN

## 2021-12-18 MED ORDER — MORPHINE SULFATE (PF) 2 MG/ML IV SOLN
2.0000 mg | INTRAVENOUS | Status: DC | PRN
  Administered 2021-12-18 – 2021-12-19 (×4): 2 mg via INTRAVENOUS
  Filled 2021-12-18 (×4): qty 1

## 2021-12-18 MED ORDER — DEXMEDETOMIDINE HCL IN NACL 200 MCG/50ML IV SOLN
INTRAVENOUS | Status: DC | PRN
  Administered 2021-12-18: 64.92 ug via INTRAVENOUS
  Administered 2021-12-18: .7 ug/kg/h via INTRAVENOUS

## 2021-12-18 MED ORDER — MAGNESIUM SULFATE 2 GM/50ML IV SOLN: 2.0000 g | Freq: Every day | INTRAVENOUS | Status: DC | PRN

## 2021-12-18 MED ORDER — NITROGLYCERIN IN D5W 200-5 MCG/ML-% IV SOLN: 5.0000 ug/min | INTRAVENOUS | Status: DC

## 2021-12-18 MED ORDER — PROPOFOL 1000 MG/100ML IV EMUL
INTRAVENOUS | Status: AC
  Filled 2021-12-18: qty 100

## 2021-12-18 MED ORDER — GUAIFENESIN-DM 100-10 MG/5ML PO SYRP: 15.0000 mL | ORAL_SOLUTION | ORAL | Status: DC | PRN

## 2021-12-18 MED ORDER — HEPARIN SODIUM (PORCINE) 1000 UNIT/ML IJ SOLN
INTRAMUSCULAR | Status: DC | PRN
  Administered 2021-12-18: 10000 [IU] via INTRAVENOUS

## 2021-12-18 MED ORDER — PHENYLEPHRINE HCL-NACL 20-0.9 MG/250ML-% IV SOLN
INTRAVENOUS | Status: DC | PRN
  Administered 2021-12-18: 20 ug/min via INTRAVENOUS

## 2021-12-18 MED ORDER — METOPROLOL TARTRATE 5 MG/5ML IV SOLN: 2.0000 mg | INTRAVENOUS | Status: DC | PRN

## 2021-12-18 MED ORDER — EPINEPHRINE 1 MG/10ML IJ SOSY
PREFILLED_SYRINGE | INTRAMUSCULAR | Status: DC | PRN
  Administered 2021-12-18 (×3): 10 ug via INTRAVENOUS

## 2021-12-18 MED ORDER — HYDRALAZINE HCL 20 MG/ML IJ SOLN: 5.0000 mg | INTRAMUSCULAR | Status: DC | PRN

## 2021-12-18 MED ORDER — IODIXANOL 320 MG/ML IV SOLN
INTRAVENOUS | Status: DC | PRN
  Administered 2021-12-18: 85 mL

## 2021-12-18 MED ORDER — SODIUM CHLORIDE 0.9 % IV SOLN
500.0000 mL | Freq: Once | INTRAVENOUS | Status: AC | PRN
  Administered 2021-12-18: 500 mL via INTRAVENOUS

## 2021-12-18 MED ORDER — SODIUM BICARBONATE 8.4 % IV SOLN
50.0000 meq | Freq: Once | INTRAVENOUS | Status: AC
  Administered 2021-12-18: 50 meq via INTRAVENOUS
  Filled 2021-12-18: qty 50

## 2021-12-18 MED ORDER — ACETAMINOPHEN 325 MG PO TABS
325.0000 mg | ORAL_TABLET | ORAL | Status: DC | PRN
  Administered 2021-12-20: 650 mg via ORAL
  Filled 2021-12-18 (×2): qty 2

## 2021-12-18 MED ORDER — FENTANYL CITRATE (PF) 100 MCG/2ML IJ SOLN
INTRAMUSCULAR | Status: DC | PRN
  Administered 2021-12-18: 50 ug via INTRAVENOUS

## 2021-12-18 SURGICAL SUPPLY — 54 items
BALLN LUTONIX DCB 5X80X130 (BALLOONS) ×1
BALLN ULTRVRSE 5X60X130C (BALLOONS) ×1
BALLN ULTRVRSE 6X200X130 (BALLOONS) ×1
BALLN ULTRVRSE 6X80X75 (BALLOONS) ×1
BALLN ULTRVRSE 7X80X130C (BALLOONS) ×2
BALLN ULTRVRSE 8X40X75C (BALLOONS) ×1
BALLOON LUTONIX DCB 5X80X130 (BALLOONS) IMPLANT
BALLOON ULTRVRSE 5X60X130C (BALLOONS) IMPLANT
BALLOON ULTRVRSE 6X200X130 (BALLOONS) IMPLANT
BALLOON ULTRVRSE 6X80X75 (BALLOONS) IMPLANT
BALLOON ULTRVRSE 7X80X130C (BALLOONS) IMPLANT
BALLOON ULTRVRSE 8X40X75C (BALLOONS) IMPLANT
CATH ACCU-VU SIZ PIG 5F 70CM (CATHETERS) IMPLANT
CATH ANGIO 5F 80CM MHK1-H (CATHETERS) IMPLANT
CATH BALLN CODA 9X100X32 (BALLOONS) IMPLANT
CATH BEACON 5 .035 40 KMP TP (CATHETERS) IMPLANT
CATH BEACON 5 .038 40 KMP TP (CATHETERS) ×1
CATH KUMPE SOFT-VU 5FR 65 (CATHETERS) IMPLANT
CATH VS15FR (CATHETERS) IMPLANT
CLOSURE PERCLOSE PROSTYLE (VASCULAR PRODUCTS) IMPLANT
COVER DRAPE FLUORO 36X44 (DRAPES) IMPLANT
COVER PROBE ULTRASOUND 5X96 (MISCELLANEOUS) IMPLANT
DEVICE CLOSURE MYNXGRIP 6/7F (Vascular Products) IMPLANT
DEVICE SAFEGUARD 24CM (GAUZE/BANDAGES/DRESSINGS) IMPLANT
DEVICE TORQUE (MISCELLANEOUS) IMPLANT
DRYSEAL FLEXSHEATH 12FR 33CM (SHEATH) ×1
DRYSEAL FLEXSHEATH 18FR 33CM (SHEATH) ×1
ENSNARE 9-15 (MISCELLANEOUS) IMPLANT
EXCLUDER TNK 28X14.5MMX12CM (Endovascular Graft) IMPLANT
EXCLUDER TRUNK 28X14.5MMX12CM (Endovascular Graft) ×1 IMPLANT
GLIDEWIRE ADV .035X180CM (WIRE) IMPLANT
GLIDEWIRE ADV .035X260CM (WIRE) IMPLANT
GLIDEWIRE STIFF .35X180X3 HYDR (WIRE) IMPLANT
GUIDEWIRE PFTE-COATED .018X300 (WIRE) IMPLANT
KIT ENCORE 26 ADVANTAGE (KITS) IMPLANT
LEG CONTRALATERAL 16X12X12 (Vascular Products) IMPLANT
LEG CONTRALATERAL 16X14.5X10 (Vascular Products) IMPLANT
NDL ENTRY 21GA 7CM ECHOTIP (NEEDLE) IMPLANT
NEEDLE ENTRY 21GA 7CM ECHOTIP (NEEDLE) ×1 IMPLANT
PACK ANGIOGRAPHY (CUSTOM PROCEDURE TRAY) ×1 IMPLANT
SET INTRO CAPELLA COAXIAL (SET/KITS/TRAYS/PACK) IMPLANT
SHEATH BRITE TIP 6FRX11 (SHEATH) IMPLANT
SHEATH BRITE TIP 7FRX11 (SHEATH) IMPLANT
SHEATH BRITE TIP 8FRX11 (SHEATH) IMPLANT
SHEATH DRYSEAL FLEX 12FR 33CM (SHEATH) IMPLANT
SHEATH DRYSEAL FLEX 18FR 33CM (SHEATH) IMPLANT
SPONGE XRAY 4X4 16PLY STRL (MISCELLANEOUS) IMPLANT
STENT LIFESTAR 8X80X80 (Permanent Stent) IMPLANT
STENT VIABAHN 8X7.5X120 (Permanent Stent) IMPLANT
SYR MEDRAD MARK 7 150ML (SYRINGE) IMPLANT
TUBING CONNECTING 10 (TUBING) IMPLANT
TUBING CONTRAST HIGH PRESS 72 (TUBING) IMPLANT
WIRE AMPLATZ SSTIFF .035X260CM (WIRE) IMPLANT
WIRE GUIDERIGHT .035X150 (WIRE) IMPLANT

## 2021-12-18 NOTE — Progress Notes (Signed)
An USGPIV (ultrasound guided PIV) has been placed for short-term vasopressor infusion. A correctly placed ivWatch must be used when administering Vasopressors. Should this treatment be needed beyond 72 hours, central line access should be obtained.  It will be the responsibility of the bedside nurse to follow best practice to prevent extravasations.   ?

## 2021-12-18 NOTE — Anesthesia Postprocedure Evaluation (Signed)
Anesthesia Post Note  Patient: Shannon Chen  Procedure(s) Performed: ENDOVASCULAR REPAIR/STENT GRAFT  Patient location during evaluation: Specials Recovery Anesthesia Type: General Level of consciousness: awake, patient cooperative and responds to stimulation Pain management: pain level controlled Vital Signs Assessment: post-procedure vital signs reviewed and stable Respiratory status: spontaneous breathing and nonlabored ventilation Cardiovascular status: stable (hypotensive but stable) Postop Assessment: no headache and no apparent nausea or vomiting Anesthetic complications: no   No notable events documented.   Last Vitals:  Vitals:   12/18/21 1345 12/18/21 1350  BP: (!) 85/46 (!) 85/44  Pulse: 69 70  Resp: 16 16  Temp:    SpO2: 99% 100%    Last Pain:  Vitals:   12/18/21 1350  TempSrc:   PainSc: Asleep                 Darrin Nipper

## 2021-12-18 NOTE — Progress Notes (Signed)
PT Cancellation Note  Patient Details Name: Shannon Chen MRN: [id number] DOB: 02-Aug-1945   Cancelled Treatment:     Pt did not receive skilled PT interventions today because pt not available. Pt to OR for Endovascular Repair/Stent graft. PT will attempt tomorrow if pt is appropriate.   Joaquin Music PT DPT 10:08 AM,12/18/21

## 2021-12-18 NOTE — Transfer of Care (Addendum)
Immediate Anesthesia Transfer of Care Note  Patient: Shannon Chen  Procedure(s) Performed: ENDOVASCULAR REPAIR/STENT GRAFT  Patient Location: PACU and Cath Lab  Anesthesia Type:General  Level of Consciousness: oriented, drowsy, patient cooperative and responds to stimulation  Airway & Oxygen Therapy: Patient Spontanous Breathing and Patient connected to nasal cannula oxygen  Post-op Assessment: Report given to RN and Post -op Vital signs reviewed and stable  Post vital signs: Reviewed and stable  Last Vitals:  Vitals Value Taken Time  BP 85/44 12/18/21 1350  Temp 36.1 C 12/18/21 1243  Pulse 73 12/18/21 1355  Resp 17 12/18/21 1355  SpO2 100 % 12/18/21 1355  Vitals shown include unvalidated device data.  Last Pain:  Vitals:   12/18/21 1350  TempSrc:   PainSc: Asleep      Patients Stated Pain Goal: 0 (37/34/28 7681)  Complications: No notable events documented.

## 2021-12-18 NOTE — Progress Notes (Signed)
Patient remains hypotensive following ordered fluid boluses and max dose of ordered dopamine. Phone call to notify Dr. Delana Meyer. Per Dr. Delana Meyer, he would like intensivist consulted. Notified Dr. Mortimer Fries. Per Dr. Mortimer Fries administer additional 500 ml fluid bolus and notify Dr. Mortimer Fries results once bolus complete.

## 2021-12-18 NOTE — Anesthesia Procedure Notes (Signed)
Procedure Name: MAC Date/Time: 12/18/2021 9:30 AM  Performed by: Esaw Grandchild, CRNAPre-anesthesia Checklist: Patient identified, Emergency Drugs available, Suction available and Patient being monitored Patient Re-evaluated:Patient Re-evaluated prior to induction Oxygen Delivery Method: Simple face mask Induction Type: IV induction

## 2021-12-18 NOTE — Progress Notes (Signed)
OT Cancellation Note  Patient Details Name: Shannon Chen MRN: [id number] DOB: 1945/12/20   Cancelled Treatment:    Reason Eval/Treat Not Completed: Patient at procedure or test/ unavailable. Pt currently off the floor at OR for Endovascular Repair/Stent graft. OT will re-attempt tomorrow as long as pt is medically appropriate.  Doneta Public 12/18/2021, 12:00 PM

## 2021-12-18 NOTE — Interval H&P Note (Signed)
History and Physical Interval Note:  12/18/2021 9:25 AM  Shannon Chen  has presented today for surgery, with the diagnosis of Illiac artery aneurysm.  The various methods of treatment have been discussed with the patient and family. After consideration of risks, benefits and other options for treatment, the patient has consented to  Procedure(s): ENDOVASCULAR REPAIR/STENT GRAFT (N/A) as a surgical intervention.  The patient's history has been reviewed, patient examined, no change in status, stable for surgery.  I have reviewed the patient's chart and labs.  Questions were answered to the patient's satisfaction.     [name]

## 2021-12-18 NOTE — Op Note (Signed)
OPERATIVE NOTE   PROCEDURE: US guidance for vascular access, bilateral femoral arteries Catheter placement into aorta from bilateral femoral approaches Placement of a 28 x 14 x 12 Gore Excluder Endoprosthesis main body and a 12 x 12 ipsilateral extension to treat external iliac occlusive disease with a 14 x 10 contralateral limb Placement of an 8 x 80 life star stent right external iliac artery for occlusive disease Placement of a 8 mm x 7.5 m Viabahn left common femoral for a combination of extravasation and occlusive disease ProGlide closure devices right femoral arteries, manual pressure to the 7 French sheath on the left. Placement of a 7 French sheath left superficial femoral artery.  PRE-OPERATIVE DIAGNOSIS: AAA; atherosclerotic occlusive disease bilateral lower extremities with rest pain  POST-OPERATIVE DIAGNOSIS: same  SURGEON: [name], MD and Leotis Pain, MD - Co-surgeons  ANESTHESIA: MAC with propofol  ESTIMATED BLOOD LOSS: 100 cc  FINDING(S): 1.  AAA with severe atherosclerotic occlusive disease acute onset of bilateral lower extremity weakness.   SPECIMEN(S):  none  INDICATIONS:   Shannon Chen is a 76 y.o. y.o. 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  These have demonstrated rapid expansion and is therefore felt imperative to treat her aneurysmal disease to prevent lethal rupture.  In association with her aneurysmal disease she has profound atherosclerotic occlusive disease at multiple levels.  The risks and benefits for stent graft repair and concha mitten treatment of her occlusive disease were reviewed all questions were answered patient agrees to proceed.  DESCRIPTION: After obtaining full informed written consent, the patient was brought back to the operating room and placed supine upon the operating table.  The  patient received IV antibiotics prior to induction.  After obtaining adequate anesthesia, the patient was prepped and draped in the standard fashion for endovascular AAA repair.  Co-surgeons are required because this is a complex bilateral procedure with work being performed simultaneously from both the right femoral and left femoral approach.  This also expedites the procedure making a shorter operative time reducing complications and improving patient safety.  We then began by gaining access to both femoral arteries with US guidance with me working on the patient's left and Dr. Lucky Cowboy working on the patient's right.  The femoral arteries were found to be patent and accessed without difficulty with a needle under ultrasound guidance with moderate difficulty on each side and permanent images were recorded.  We then placed 2 proglide devices on the right side in a pre-close fashion and placed 8 French sheath.  Unfortunately we were not able to get sufficient wire purchase on the left side that would allow for placement of the preclose devices.  Hand-injection contrast demonstrated the external iliac artery was profoundly diseased with long segment near total occlusions in association with a large amount of thrombus within the aneurysm and a greater than 80% stenosis the origin of the left common iliac.  Ultimately we were able to place a snare in the distal external iliac on the left and Dr. Lucky Cowboy was able to negotiate a wire from the right to the left using a V S1 catheter to cross the aortic bifurcation.  The catheters were then advanced into the aorta simultaneously and the wire removed.  This allowed passage of a wire through the Kumpe catheter from the left groin and I proceeded with placing Perclose devices in  the preclose fashion and upsized to an 8 [place] sheath.  The patient was then given 10,000 units of intravenous heparin.   The Pigtail catheter was placed into the aorta from the right side. Using this  image, we selected a 28 x 14 x 12 Main body device.  Over a stiff wire, an 18 French sheath was placed. The main body was then placed through the 18 French sheath. A Kumpe catheter was placed up the right side and a magnified image at the renal arteries was performed. The main body was then deployed just below the lowest renal artery. The Kumpe catheter was used to cannulate the contralateral gate without difficulty and successful cannulation was confirmed by twirling the pigtail catheter in the main body. We then placed a stiff wire and a retrograde arteriogram was performed through the right femoral sheath. We upsized to the 12 [place] sheath for the contralateral limb and a 14 x 10 limb was selected and deployed. The main body deployment was then completed. Based off the angiographic findings, extension limbs were needed on the left given the profound occlusive disease and the lack of an internal iliac artery on the side necessary.  A 12 x 12 extender limb was then deployed overlapping the first limb and extending into the distal one third of the left external iliac artery. All junction points and seals zones were treated with the compliant balloon.  It was at this point that we identified the large sheath had created a traumatic laceration within the left proximal common femoral/left distal external iliac artery with significant extravasation.  Given that the sheath was the causative factor we did not feel that the Perclose's would be able to adequately treat this situation.  Ultrasound was then delivered back to the field placed in a sterile sleeve and the distal common femoral femoral bifurcation was evaluated with ultrasound.  To allow for adequate purchase I imaged the proximal SFA.  Proximal SFA was echolucent and patent.  Microneedle was then inserted under direct ultrasound visualization followed by microwire followed by microsheath.  A J-wire was then advanced under fluoroscopic guidance allowing of  purchase and a 7 French sheath was then placed.  Essentially this was now arterial access approximately 8 cm distal to the original access site.  At this point we took an 8 mm angioplasty balloon and positioned it through the 18 French sheath in the proximal external iliac artery.  It was inflated to 6 atm as an occlusive balloon.  This allowed for proximal control.  The sheath was then removed over the shaft of the balloon and a 0.018 advantage wire advanced under fluoroscopic guidance through the 7 French sheath.  Once the wire had been negotiated into the aorta a 8 mm x 7.5 cm Viabahn was then positioned across the proximal common femoral extending into the external iliac artery and deployed.  Was then postdilated with a 5 mm balloon inflated to 8 atm for 30 seconds.  2 serial inflations were required.  Follow-up imaging through the 7 French sheath now demonstrated no further extravasation excellent seal has been obtained proximally and distally.  There is no longer any bleeding from the access site externally either.  We then imaged the right external iliac artery which was found to have diffuse greater than 90% stenoses.  We selected an 8 mm x 80 mm life star stent and deployed it overlapping the 14 x 10 limb by about 1 cm and extending the stent down to just above  the ilioinguinal ligament.  It was then postdilated with a 5 mm x 80 mm Lutonix drug-eluting balloon.  Follow-up imaging demonstrated patency of the external iliac artery with less than 10% residual stenosis and preservation of the internal iliac on the right side. The pigtail catheter was then replaced and a completion angiogram was performed.   No endoleak was detected on completion angiography. The renal arteries were found to be widely patent.    At this point we elected to terminate the procedure. We secured the pro glide devices for hemostasis on the right femoral artery.  On the left side a minx device was attempted but the occlusive  disease tore the balloon and we elected to pull the sheath and hold manual pressure.  The skin incision was closed with a 4-0 Monocryl. Dermabond and pressure dressing were placed. The patient was taken to the recovery room in stable condition having tolerated the procedure well.  COMPLICATIONS: none  CONDITION: stable  [name]  12/18/2021, 12:18 PM

## 2021-12-18 NOTE — Anesthesia Preprocedure Evaluation (Signed)
Anesthesia Evaluation  Patient identified by MRN, date of birth, ID band Patient awake    Reviewed: Allergy & Precautions, NPO status , Patient's Chart, lab work & pertinent test results  History of Anesthesia Complications Negative for: history of anesthetic complications  Airway Mallampati: IV   Neck ROM: Full    Dental  (+) Poor Dentition   Pulmonary former smoker,    Pulmonary exam normal breath sounds clear to auscultation       Cardiovascular hypertension, + CAD and + Peripheral Vascular Disease (carotid stenosis s/p CEA on Plavix; common iliac artery aneurysm)  Normal cardiovascular exam+ dysrhythmias (Mobitz II AVB) + pacemaker  Rhythm:Regular Rate:Normal  HOCM, preserved EF  ECG 12/10/21: NSR, RBBB, new TWI, no STEMI  Cardiac catheterization for elevated troponin October 2022: Severely calcified and tortuous right coronary artery 70% stenosis, unable to pass catheter, small in nature and tortuous vessel, medical management recommended  Repeat catheterization November 2022 for altered mental status and elevated troponin, findings showing stable coronary disease Attempt to PCI RCA unsuccessful, medical management recommended   Neuro/Psych CVA (multiple), No Residual Symptoms    GI/Hepatic negative GI ROS,   Endo/Other  diabetes, Type 2  Renal/GU Renal disease (AKI on CKD)     Musculoskeletal  (+) Arthritis , Rhabdomyolysis     Abdominal   Peds  Hematology negative hematology ROS (+)   Anesthesia Other Findings Inpatient cardiology note 12/13/21:  A/P: Preop cardiovascular evaluation No anginal symptoms, relatively recent cardiac catheterizations not amenable to intervention, medical management has been previously recommended No unstable anginal symptoms, Acceptable risk for vascular surgery History of LVH, would continue beta-blocker perioperatively Would continue Plavix per vascular No additional  cardiac testing needed at this time History of high degree heart block, status post pacemaker followed by EP, device functioning well -Given debility at baseline, underlying coronary disease, diffuse PAD, overall high risk for PV procedure and surgery on renal nodule   Outpatient cardiology note 11/12/21:  Assessment and  Plan  Mobitz 2 AVB  Pacemaker--Medtronic ParaHisian   Abnormal ECG-dynamic T wave changes  Syncope  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  Life is much better. Blood pressures have been much better.  Continue the amlodipine 2.5 and metoprolol 50.  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  No chest pain.  On rosuvastatin and aspirin.  Last creatinine had gone up about 25%.  We will recheck.  She has no cardiac contraindications to having her tooth extracted.  Right bundle branch block is stable  Normal device function.  30% ventricular pacing.  No interval syncope.  Reproductive/Obstetrics                             Anesthesia Physical  Anesthesia Plan  ASA: 4  Anesthesia Plan: General   Post-op Pain Management:    Induction: Intravenous  PONV Risk Score and Plan: 3 and Ondansetron, Treatment may vary due to age or medical condition, TIVA and Midazolam  Airway Management Planned: Natural Airway  Additional Equipment:   Intra-op Plan:   Post-operative Plan:   Informed Consent: I have reviewed the patients History and Physical, chart, labs and discussed the procedure including the risks, benefits and alternatives for the proposed  anesthesia with the patient or authorized representative who has indicated his/her understanding and acceptance.     Dental  advisory given  Plan Discussed with: CRNA  Anesthesia Plan Comments: (LMA/GETA backup discussed.  Patient consented for risks of anesthesia including but not limited to:  - adverse reactions to medications - damage to eyes, teeth, lips or other oral mucosa - nerve damage due to positioning  - sore throat or hoarseness - damage to heart, brain, nerves, lungs, other parts of body or loss of life  Informed patient about role of CRNA in peri- and intra-operative care.  Patient voiced understanding.)        Anesthesia Quick Evaluation

## 2021-12-18 NOTE — Op Note (Signed)
OPERATIVE NOTE   PROCEDURE: US guidance for vascular access, bilateral femoral arteries as well as left superficial femoral artery Catheter placement into aorta from bilateral femoral approaches Placement of a 28 mm diameter proximal 12 cm length Gore Excluder Endoprosthesis main body left with a 14 mm diameter by 10 cm length right iliac contralateral limb Placement of a 12 mm diameter by 12 cm length left iliac extension limb down to the left external iliac artery to treat and get beyond the aneurysmal disease in the left common iliac artery Placement of an 8 mm diameter by 8 cm length right external iliac artery life star stent for occlusive disease Placement of an 8 mm diameter by 7.5 cm length Viabahn stent in the left common femoral artery and distal external iliac artery for occlusive disease and extravasation ProGlide closure devices right femoral artery  PRE-OPERATIVE DIAGNOSIS: AAA  POST-OPERATIVE DIAGNOSIS: same  SURGEON: Leotis Pain, MD and [name], MD - Co-surgeons  ANESTHESIA: MAC  ESTIMATED BLOOD LOSS: 100 cc  FINDING(S): 1.  AAA and large left common iliac artery aneurysm 2.  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  SPECIMEN(S):  none  INDICATIONS:   Shannon Chen is a 76 y.o. female who presents with an abdominal aortic aneurysm and left common iliac artery aneurysm with rapid expansion. The anatomy was suitable for endovascular repair.  Risks and benefits of repair in an endovascular fashion were discussed and informed consent was obtained. Co-surgeons are used to expedite the procedure and reduce operative time as bilateral work needs to be done.  DESCRIPTION: After obtaining full informed written consent, the patient was brought back to the operating room and placed supine upon the operating table.  The patient received IV antibiotics prior to induction.  After obtaining adequate anesthesia, the patient was prepped and draped in the  standard fashion for endovascular AAA repair.  We then began by gaining access to both femoral arteries with US guidance with me working on the right and Dr. Delana Meyer working on the left.  The femoral arteries were found to be patent but diseased and accessed without difficulty with a needle under ultrasound guidance without difficulty on each side and permanent images were recorded.  We then placed 2 proglide devices on each side in a pre-close fashion and placed 8 French sheaths. The patient was then given 10000 units of intravenous heparin.  Due to the occlusive disease, placement of the wire in the aorta was somewhat tedious.  I was able to get up on the right side somewhat tediously with an advantage wire and a Kumpe catheter but from the left side due to the large amount of thrombus and aneurysm in the left common iliac artery and the associated common and external iliac artery occlusive disease, getting into the aorta from the left side was difficult.  Ultimately, I used a V S1 catheter from the right side and advanced a Glidewire down to the left external iliac artery where Dr. Delana Meyer snared the wire from the left femoral approach.  We then advanced catheters up into the aorta simultaneously to ensure intraluminal placement.  He then placed a stiff wire up on the left and upsized to an 18 [place] sheath.  The Pigtail catheter was placed into the aorta from the right side. Using this image, we selected a 28 mm diameter by 12 cm length Main body device. The main body was then placed through the 18 French sheath on the left side. A Kumpe  catheter was placed up the right side and a magnified image at the renal arteries was performed. The main body was then deployed just below the lowest renal artery which was the left. The Kumpe catheter was used to cannulate the contralateral gate without difficulty and successful cannulation was confirmed by twirling the pigtail catheter in the main body. We then placed a stiff  wire and a retrograde arteriogram was performed through the right femoral sheath. We upsized to the 12 [place] sheath on the right for the contralateral limb and a 14 mm diameter by 10 cm length right iliac limb was selected and deployed. The main body deployment was then completed. Based off the angiographic findings, extension limbs were necessary to get beyond the aneurysmal disease in the left common iliac artery and get down into the left external iliac artery.  The left hypogastric artery was diminutive and had essentially no flow so coil embolization of this was not necessary.  A 12 mm diameter by 12 cm length left iliac extension limb was taken down to the mid to distal left external iliac artery with appropriate overlap within the original stent graft. All junction points and seals zones were treated with the compliant balloon. The pigtail catheter was then replaced and a completion angiogram was performed.  No endoleak was detected on completion angiography. The renal arteries were found to be widely patent.  The right hypogastric artery remained patent but there was severe disease in the proximal portion of the right external iliac artery creating a greater than 70% stenosis that would require treatment.  On the left side, there was extravasation from the distal external iliac artery and proximal common femoral artery from the large sheath with the associated occlusive disease.  This prompted an issue requiring new access in the left proximal superficial femoral artery well below the original access site and subsequent covered stent placement.  Dr. Delana Meyer accessed the proximal left superficial femoral artery without difficulty with a micropuncture needle and a micropuncture wire and sheath were then placed.  We then placed an 8 mm balloon in the left external iliac artery for proximal control and hemostasis and were able to remove the 18 French sheath.  Once were able to do this, we were able to navigate  a 7 [place] sheath on the left superficial femoral artery and then get a 0.018 advantage wire around the balloon and up into the thoracic aorta.  Imaging through this 7 French sheath in the left superficial femoral artery showed the disease with significant greater than 50% stenosis in the left proximal common femoral artery and external iliac artery with associated extravasation.  An 8 mm diameter by 7.5 cm length Viabahn stent was then taken about 2 cm up into the iliac limb extension that had been previously placed and down into the mid left common femoral artery.  This was postdilated with a 5 mm balloon with excellent angiographic completion result and less than 10% residual stenosis and no residual extravasation.  We were then able to cut the Pro-glide devices as this cover the area of the access site and there was no further bleeding from this on the left side.  For the occlusive disease on the right side, an 8 mm diameter by 8 cm length life star stent was deployed just into the right distal common iliac artery and going down to the mid to distal right external iliac artery.  This was postdilated with a 5 mm diameter Lutonix drug-coated angioplasty balloon with  less than 10% residual stenosis after stent placement in the right external iliac artery.  At this point we elected to terminate the procedure. We secured the pro glide devices for hemostasis on the femoral artery on the right. The skin incision was closed with a 4-0 Monocryl. Dermabond and pressure dressing were placed.  For the 7 French sheath on the left side, the minx device had rupture of the balloon from the occlusive disease in the left common femoral artery and proximal SFA, so this was removed and pressure was held at the site for 20 minutes.  The patient was taken to the recovery room in stable condition having tolerated the procedure well.  COMPLICATIONS: none  CONDITION: stable  Leotis Pain  12/18/2021, 12:40 PM   This note was  created with Dragon Medical transcription system. Any errors in dictation are purely unintentional.

## 2021-12-18 NOTE — Progress Notes (Signed)
[facility] [place] NAME: Shannon Chen    MR#:  [id number]  DATE OF BIRTH:  24-Feb-1945  SUBJECTIVE:   Patient seen in PACU earlier. She was sedated. Started on IV phenylephrine secondary to low blood pressure. Received  2 unit blood transfusion during procedure given blood loss around 400 mL per Dr. Lucky Cowboy given complexity of her procedure.   VITALS:  Blood pressure 93/75, pulse 72, temperature (!) 97 F (36.1 C), resp. rate 14, height 5' 5.5" (1.664 m), weight 64.9 kg, SpO2 100 %.  PHYSICAL EXAMINATION:   GENERAL:  76 y.o.-year-old patient lying in the bed with no acute distress. Chronically ill LUNGS: Normal breath sounds bilaterally, no wheezing CARDIOVASCULAR: S1, S2 normal. No murmurs,   ABDOMEN: Soft, nontender, nondistended. Bowel sounds present.  EXTREMITIES: edema+ NEUROLOGIC: nonfocal  patient is lethargic from sedation   LABORATORY PANEL:  CBC Recent Labs  Lab 12/18/21 1349  WBC 16.4*  HGB 8.9*  HCT 28.4*  PLT 121*    Chemistries  Recent Labs  Lab 12/13/21 0436 12/14/21 0418 12/18/21 1349  NA 142   < > 138  K 3.7   < > 4.9  CL 111   < > 114*  CO2 22   < > 16*  GLUCOSE 111*   < > 278*  BUN 47*   < > 42*  CREATININE 1.47*   < > 1.11*  CALCIUM 9.0   < > 6.6*  MG 2.0   < > 1.8  AST 56*  --   --   ALT 95*  --   --   ALKPHOS 79  --   --   BILITOT 0.8  --   --    < > = values in this interval not displayed.    Assessment and Plan  Shannon Chen is a 76 year old with history of CAD, HOCM, AV block status post PPM, HTN, prior CVA admitted for bilateral lower extremity weakness..  About 5 days PTA she started experiencing bilateral lower extremity weakness and difficulty walking.  In the ED she was found to have rhabdomyolysis.  CT of the head, cervical thoracic and lumbar spine did not show any acute pathology.   Neuro consulted, due to concern for myopathy as paraneoplastic syndromes, CT  chest/abdomen/pelvis w/ contrast performed for malignancy screening which found incidental vascular renal findings as below.  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   Sudden onset about 5 days PTA.  Reported numbness and weakness but no pain.  CT head, cervical, lumbar and thoracic spine are unremarkable.  Unable to get MRI due to pacemaker.   --Neurology consulted with Dr. Curly Shores.  CK elevated at 6906, folate, B12 low normal, TSH normal.  CRP wnl, ESR only mildly elevated at 37. --Vit D 15 (low), which may contribute to proximal myopathy. --Continue 65 mg daily prednisone for 4-6 weeks (until rheumatology follow-up), taper per outpatient provider and pending clinical course and workup  --cont protonix and Ca supplementation while on steroid -outpatient rheumatologist/neurologist to consider pneumocystis jiroveci prophylaxis if high dose steroids will be continued longer term -Pending: serum myoglobin--4189 myositis panel, , MMA --514 -cont Vitamin D repletion, 50,000 units weekly for 8 weeks --cont vit B12 supplement --Continue to hold statin --f/u muscle biopsy result to assess for PM.  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  This places her at a very high risk for lethal rupture primarily from the left common iliac artery aneurysm.  The patient is a candidate for endovascular repair.  --Stent graft placement with Dr. 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 Delana Meyer, will need to wait  1-2 months after the abdominal repair to minimize the risk of paraplegia   9 mm nodule in left kidney --interval increase in size.  Raises possibility for paraneoplastic syndrome as presented with PM, however, CT renal protocal w/wo contrast showed the nodule to be benign.  --Oncology consulted, with Dr. Tasia Catchings   Acute kidney injury superimposed on CKD ([place]) Baseline creatinine 0.9.  Admission creatinine 2.08, slowly improving with IV fluids, resolved now.   Hypertension Hypotension post procedure - hold amlodipine and Toprol   Hypertrophic cardiomyopathy ([place]) Patient is euvolemic at this time.  She is chest pain-free with stable EKG and troponins within her baseline range   Type 2 diabetes mellitus with hyperlipidemia [facility]) She is on [place] for heart failure.  Hemoglobin A1c 6.4 --SSI while on steroid   Chronic obstructive pulmonary disease ([name]) --cont daily bronchodilators     DVT prophylaxis: Lovenox SQ Code Status: Full code  Family Communication: none today Level of care: Med-Surg Dispo:   The patient is from: home Anticipated d/c is to: SNF rehab      TOTAL TIME TAKING CARE OF THIS PATIENT: 35 minutes.  >50% time spent on counselling and coordination of care  Note: This dictation was prepared with Dragon dictation along with smaller phrase technology. Any transcriptional errors that result from this process are unintentional.  Fritzi Mandes M.D    [facility]   CC: Primary care physician; Remi Haggard, FNP

## 2021-12-18 NOTE — Consult Note (Signed)
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 VASC surgery consulted s/p endovascular repair, s/p surgery post op shock, PCCM asked to assess  History of Present Illness:  76 y.o. female with medical history significant of CAD, HOCM, AV block s/p PPM, hypertension, hyperlipidemia, prior CVA who presents to the ED with complaints of bilateral lower extremity weakness.   5 days PTA,  She noticed during the shower that she was unable to feel her legs and she was experiencing difficulty walking due to this.    She was able to make it out of the shower and since this time, her symptoms have persisted.    Due to this, she is no longer to ambulate independently.   She describes being unable to feel where her legs are and unable to lift her legs up.    She denies any numbness to touch, tingling or burning sensation.  She denies any back pain, recent falls or trauma.  She denies any recent illness including fever, chills.  She denies any rectal or genital numbness, urinary or bowel incontinence.     ED Course:  On arrival to the ED, patient was normotensive at 116/59 with heart rate of 64.  She was afebrile 97.8.  She was saturating at 95% on room air.Initial work-up remarkable for bicarb of 20, glucose of 100, BUN of 40, creatinine of 2.08.  Troponin elevated at 118 with a downtrend to 113 consistent with patient's prior troponin levels.  CBC unremarkable.  Urinalysis demonstrated large hemoglobin and proteinuria.  CT head with no acute intracranial abnormality.  MRI contraindicated due to PPM.  TRH contacted for admission for further  evaluation.       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  10/30 left thigh biopsy for myopathy  11/1 Planned vasc repair for AAA 11/1 post op shock and placed on pressors, severe acidosis    Pertinent  Medical History   FINDING(S): 1.  AAA and large left common iliac artery aneurysm 2.  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   PROCEDURE: US guidance for vascular access, bilateral femoral arteries as well as left superficial femoral artery Catheter placement into aorta from bilateral femoral approaches Placement of a 28 mm diameter proximal 12 cm length Gore Excluder Endoprosthesis main body left with a 14 mm diameter by 10 cm length right iliac contralateral limb Placement of a 12 mm diameter by 12 cm length left iliac extension limb down to the left external iliac artery to treat and get beyond the aneurysmal disease in the left common iliac artery Placement of an 8 mm diameter by 8 cm length right external iliac artery life star stent for occlusive disease Placement of an 8 mm diameter by 7.5 cm length Viabahn stent in the left common femoral  artery and distal external iliac artery for occlusive disease and extravasation ProGlide closure devices right femoral artery       Micro Data:  10/23 COVID NEG  Antimicrobials:   Antibiotics Given (last 72 hours)     Date/Time Action Medication Dose   12/18/21 0916 Given   [MAR Hold] ceFAZolin (ANCEF) IVPB 2g/100 mL premix (MAR Hold since Wed 12/18/2021 at 0829.Hold Reason:  Transfer to a Procedural area) 2 g     '          Objective   Blood pressure (!) 75/52, pulse 81, temperature (!) 97 F (36.1 C), resp. rate 18, height 5' 5.5" (1.664 m), weight 64.9 kg, SpO2 100 %.        Intake/Output Summary (Last 24 hours) at 12/18/2021 1543 Last data filed at 12/18/2021 1218 Gross per 24 hour  Intake 1770 ml  Output 400 ml  Net 1370 ml   Filed Weights   12/09/21 0952  Weight: 64.9 kg     REVIEW OF SYSTEMS  PATIENT IS UNABLE TO PROVIDE COMPLETE REVIEW OF SYSTEMS DUE TO SEVERE CRITICAL ILLNESS/LETHARGIC  PHYSICAL EXAMINATION:  GENERAL:critically ill appearing EYES: Pupils equal, round, reactive to light.  No scleral icterus.  MOUTH: Moist mucosal membrane.  NECK: Supple.  PULMONARY: Lungs clear to auscultation,  CARDIOVASCULAR: S1 and S2.  Regular rate and rhythm GASTROINTESTINAL: Soft, nontender, -distended. Positive bowel sounds.  MUSCULOSKELETAL: No swelling, clubbing, or edema.  NEUROLOGIC: obtunded,sedated SKIN:normal, warm to touch, Capillary refill delayed  Pulses present bilaterally PAD/s in place   Labs/imaging that I havepersonally reviewed  (right click and "Reselect all SmartList Selections" daily)      ASSESSMENT AND PLAN SYNOPSIS  76 yo AAF with Multiple medical issues to be transferred to ICU for s/p AAA repair with severe PAD with post op shock likely related to blood loss and hypovolemia with general anesthesia with progressive metabolic acidosis   shock -use vasopressors to keep MAP>65 as needed Start NEO, wean off dopamine Start BICARB -follow ABG and LA as needed -follow up cultures  IV fluid Resuscitation Will consider checking ECHO  S/p AAA repair-monitor for bleeding Follow up H/H stable after 2 units   CARDIAC ICU monitoring   ACUTE KIDNEY INJURY/Renal Failure -continue Foley Catheter-assess need -Avoid nephrotoxic agents -Follow urine output, BMP -Ensure adequate renal perfusion, optimize  oxygenation -Renal dose medications   Intake/Output Summary (Last 24 hours) at 12/18/2021 1543 Last data filed at 12/18/2021 1218 Gross per 24 hour  Intake 1770 ml  Output 400 ml  Net 1370 ml      Latest Ref Rng & Units 12/18/2021    1:49 PM 12/18/2021    6:16 AM 12/17/2021    4:34 AM  BMP  Glucose 70 - 99 mg/dL 381  89  017   BUN 8 - 23 mg/dL 42  52  56   Creatinine 0.44 - 1.00 mg/dL 5.10  2.58  5.27   Sodium 135 - 145 mmol/L 138  138  139   Potassium 3.5 - 5.1 mmol/L 4.9  4.1  4.1   Chloride 98 - 111 mmol/L 114  108  106   CO2 22 - 32 mmol/L 16  25  26    Calcium 8.9 - 10.3 mg/dL 6.6  8.5  8.4     INFECTIOUS DISEASE -follow up cultures  ENDO - ICU hypoglycemic\Hyperglycemia protocol -check FSBS per protocol   GI GI PROPHYLAXIS as indicated  NUTRITIONAL STATUS DIET-->NPO Constipation protocol as indicated   ELECTROLYTES -  follow labs as needed -replace as needed -pharmacy consultation and following   ACUTE ANEMIA- TRANSFUSE AS NEEDED CONSIDER TRANSFUSION  IF HGB<7 DVT PRX with TED/SCD's ONLY     Best practice (right click and "Reselect all SmartList Selections" daily)  Diet: NPO Mobility:  bed rest  Code Status:  FULL Disposition:ICU  Labs   CBC: Recent Labs  Lab 12/15/21 0522 12/16/21 0444 12/17/21 0434 12/18/21 0616 12/18/21 1349  WBC 10.8* 12.1* 13.2* 10.6* 16.4*  HGB 10.7* 10.8* 9.4* 9.0* 8.9*  HCT 33.2* 34.3* 29.9* 29.1* 28.4*  MCV 83.6 86.0 84.7 87.9 93.7  PLT 213 212 199 201 121*    Basic Metabolic Panel: Recent Labs  Lab 12/15/21 0522 12/16/21 0444 12/17/21 0434 12/18/21 0616 12/18/21 1349  NA 142 141 139 138 138  K 3.6 3.9 4.1 4.1 4.9  CL 108 107 106 108 114*  CO2 25 24 26 25  16*  GLUCOSE 94 99 130* 89 278*  BUN 44* 47* 56* 52* 42*  CREATININE 1.03* 0.96 1.25* 1.18* 1.11*  CALCIUM 8.8* 9.0 8.4* 8.5* 6.6*  MG 2.0 2.1 2.0 2.1 1.8   GFR: Estimated Creatinine Clearance: 38.4 mL/min (A) (by C-G formula based on SCr of  1.11 mg/dL (H)). Recent Labs  Lab 12/16/21 0444 12/17/21 0434 12/18/21 0616 12/18/21 1349  WBC 12.1* 13.2* 10.6* 16.4*    Liver Function Tests: Recent Labs  Lab 12/12/21 0528 12/13/21 0436  AST 60* 56*  ALT 94* 95*  ALKPHOS 54 79  BILITOT 0.6 0.8  PROT 6.3* 6.5  ALBUMIN 3.0* 2.9*   No results for input(s): "LIPASE", "AMYLASE" in the last 168 hours. No results for input(s): "AMMONIA" in the last 168 hours.  ABG No results found for: "PHART", "PCO2ART", "PO2ART", "HCO3", "TCO2", "ACIDBASEDEF", "O2SAT"   Coagulation Profile: No results for input(s): "INR", "PROTIME" in the last 168 hours.  Cardiac Enzymes: Recent Labs  Lab 12/13/21 0436  CKTOTAL 2,301*    HbA1C: Hgb A1c MFr Bld  Date/Time Value Ref Range Status  12/09/2021 10:11 PM 6.4 (H) 4.8 - 5.6 % Final    Comment:    (NOTE) Pre diabetes:          5.7%-6.4%  Diabetes:              >6.4%  Glycemic control for   <7.0% adults with diabetes   12/12/2020 10:24 PM 6.7 (H) 4.8 - 5.6 % Final    Comment:    (NOTE) Pre diabetes:          5.7%-6.4%  Diabetes:              >6.4%  Glycemic control for   <7.0% adults with diabetes     CBG: Recent Labs  Lab 12/17/21 0811 12/17/21 1121 12/17/21 1620 12/17/21 2114 12/18/21 0741  GLUCAP 102* 192* 198* 192* 85     Past Medical History:  She,  has a past medical history of Arthritis, AV block, Mobitz II, CAD (coronary artery disease), Carotid artery disease ([place]), Essential hypertension, History of stress test, Hyperlipidemia, Hypertrophic cardiomyopathy ([place]), and Presence of permanent cardiac pacemaker.   Surgical History:   Past Surgical History:  Procedure Laterality Date   ABDOMINAL HYSTERECTOMY     CORONARY STENT INTERVENTION N/A 12/31/2020   Procedure: CORONARY STENT INTERVENTION;  Surgeon: Wellington Hampshire, MD;  Location: [facility];  Service: Cardiovascular;  Laterality: N/A;   ENDARTERECTOMY Right 11/16/2019   Procedure:  ENDARTERECTOMY CAROTID;  Surgeon: Katha Cabal, MD;  Location:  [facility];  Service: Vascular;  Laterality: Right;   INTRAVASCULAR PRESSURE WIRE/FFR STUDY N/A 12/13/2020   Procedure: INTRAVASCULAR PRESSURE WIRE/FFR STUDY;  Surgeon: Marykay LexHarding, David W, MD;  Location: [facility];  Service: Cardiovascular;  Laterality: N/A;   LEFT HEART CATH AND CORONARY ANGIOGRAPHY N/A 12/13/2020   Procedure: LEFT HEART CATH AND CORONARY ANGIOGRAPHY;  Surgeon: Marykay LexHarding, David W, MD;  Location: [facility];  Service: Cardiovascular;  Laterality: N/A;   LEFT HEART CATH AND CORONARY ANGIOGRAPHY N/A 12/31/2020   Procedure: LEFT HEART CATH AND CORONARY ANGIOGRAPHY;  Surgeon: Iran OuchArida, Muhammad A, MD;  Location: [facility];  Service: Cardiovascular;  Laterality: N/A;   MUSCLE BIOPSY Left 12/16/2021   Procedure: MUSCLE BIOPSY;  Surgeon: Campbell Lernerodenberg, Denny, MD;  Location: [facility];  Service: General;  Laterality: Left;   PACEMAKER IMPLANT N/A 10/08/2016   Procedure: Pacemaker Implant;  Surgeon: Marinus Mawaylor, Gregg W, MD;  Location: [facility];  Service: Cardiovascular;  Laterality: N/A;     Social History:   reports that she quit smoking about 3 years ago. Her smoking use included cigarettes. She has a 30.00 pack-year smoking history. She has never used smokeless tobacco. She reports that she does not drink alcohol and does not use drugs.   Family History:  Her family history includes CAD in her father; Diabetes in her mother.   Allergies No Known Allergies   Home Medications  Prior to Admission medications   Medication Sig Start Date End Date Taking? Authorizing Provider  amLODipine (NORVASC) 2.5 MG tablet Take 2.5 mg by mouth daily. 01/08/21  Yes Antonieta IbaGollan, Timothy J, MD  aspirin EC 81 MG EC tablet Take 1 tablet (81 mg total) by mouth daily. Swallow whole. 11/09/19  Yes Wouk, Wilfred CurtisNoah Bedford, MD  clopidogrel (PLAVIX) 75 MG tablet Take 75 mg by mouth daily. 03/29/21  Yes [provider]   dapagliflozin propanediol (FARXIGA) 10 MG TABS tablet Take 10 mg by mouth daily.   Yes [provider]  docusate sodium (COLACE) 100 MG capsule Take 1 capsule (100 mg total) by mouth 2 (two) times daily. 01/04/21 01/04/22 Yes Rolly SalterPatel, Pranav M, MD  KERENDIA 20 MG TABS Take 20 mg by mouth daily. 12/04/21  Yes [provider]  metoprolol succinate (TOPROL-XL) 50 MG 24 hr tablet Take 1 tablet (50 mg total) by mouth daily. 12/16/20 12/09/21 Yes Gillis SantaKumar, Dileep, MD  mirtazapine (REMERON) 7.5 MG tablet Take 7.5 mg by mouth at bedtime. 12/04/20  Yes [provider]  potassium chloride (KLOR-CON) 10 MEQ tablet Take 10 mEq by mouth daily. 03/29/21  Yes [provider]  rosuvastatin (CRESTOR) 40 MG tablet TAKE 1 TABLET BY MOUTH DAILY 12/04/20  Yes Antonieta IbaGollan, Timothy J, MD  TRELEGY ELLIPTA 200-62.5-25 MCG/ACT AEPB Inhale 1 puff into the lungs daily. 12/07/20  Yes [provider]  Vibegron (GEMTESA) 75 MG TABS Take 75 mg by mouth daily.   Yes [provider]  acetaminophen (TYLENOL) 325 MG tablet Take 650 mg by mouth every 4 (four) hours as needed for mild pain or moderate pain.    [provider]  albuterol (VENTOLIN HFA) 108 (90 Base) MCG/ACT inhaler Inhale 1-2 puffs into the lungs every 6 (six) hours as needed for wheezing or shortness of breath. 11/13/20   [provider]  butalbital-acetaminophen-caffeine (FIORICET) 50-325-40 MG tablet Take 1 tablet by mouth every 4 (four) hours as needed for headache. 01/04/21   Rolly SalterPatel, Pranav M, MD  loperamide (IMODIUM) 2 MG capsule Take  2 mg by mouth as needed. 04/17/21   [provider]  meclizine (ANTIVERT) 12.5 MG tablet Take 0.5 mg by mouth as needed for dizziness.    [provider]  mirtazapine (REMERON) 15 MG tablet Take by mouth. Patient not taking: Reported on 12/09/2021 05/28/21   [provider]  polyethylene glycol (MIRALAX) 17 g packet Take 17 g by mouth daily as needed  for mild constipation. 11/28/19   Alver Sorrow, NP  prochlorperazine (COMPAZINE) 10 MG tablet Take 1 tablet (10 mg total) by mouth every 6 (six) hours as needed for nausea or vomiting (or headache). 01/06/21   Chesley Noon, MD       DVT/GI PRX  assessed I Assessed the need for Labs I Assessed the need for Foley I Assessed the need for Central Venous Line Family Discussion when available I Assessed the need for Mobilization I made an Assessment of medications to be adjusted accordingly Safety Risk assessment completed  CASE DISCUSSED IN MULTIDISCIPLINARY ROUNDS WITH ICU TEAM     Critical Care Time devoted to patient care services described in this note is 65 minutes.   Critical care was necessary to treat /prevent imminent and life-threatening deterioration.   Lucie Leather, M.D.  [facility]  Medical Director [facility] Medical Director [facility] Cardio-Pulmonary Department

## 2021-12-18 NOTE — Progress Notes (Signed)
Patient off the floor via bed for procedure, report given.

## 2021-12-19 ENCOUNTER — Encounter: Payer: Self-pay | Admitting: Vascular Surgery

## 2021-12-19 DIAGNOSIS — R531 Weakness: Secondary | ICD-10-CM | POA: Diagnosis not present

## 2021-12-19 LAB — BASIC METABOLIC PANEL
Anion gap: 7 (ref 5–15)
BUN: 45 mg/dL — ABNORMAL HIGH (ref 8–23)
CO2: 24 mmol/L (ref 22–32)
Calcium: 6.9 mg/dL — ABNORMAL LOW (ref 8.9–10.3)
Chloride: 110 mmol/L (ref 98–111)
Creatinine, Ser: 1.2 mg/dL — ABNORMAL HIGH (ref 0.44–1.00)
GFR, Estimated: 46 mL/min — ABNORMAL LOW (ref >60)
Glucose, Bld: 131 mg/dL — ABNORMAL HIGH (ref 70–99)
Potassium: 4.1 mmol/L (ref 3.5–5.1)
Sodium: 141 mmol/L (ref 135–145)

## 2021-12-19 LAB — CBC
HCT: 23.8 % — ABNORMAL LOW (ref 36.0–46.0)
Hemoglobin: 7.8 g/dL — ABNORMAL LOW (ref 12.0–15.0)
MCH: 29.3 pg (ref 26.0–34.0)
MCHC: 32.8 g/dL (ref 30.0–36.0)
MCV: 89.5 fL (ref 80.0–100.0)
Platelets: 138 10*3/uL — ABNORMAL LOW (ref 150–400)
RBC: 2.66 MIL/uL — ABNORMAL LOW (ref 3.87–5.11)
RDW: 14.5 % (ref 11.5–15.5)
WBC: 18.2 10*3/uL — ABNORMAL HIGH (ref 4.0–10.5)
nRBC: 0.6 % — ABNORMAL HIGH (ref 0.0–0.2)

## 2021-12-19 LAB — HEMOGLOBIN AND HEMATOCRIT, BLOOD
HCT: 25.8 % — ABNORMAL LOW (ref 36.0–46.0)
HCT: 20.8 % — ABNORMAL LOW (ref 36.0–46.0)
HCT: 25.7 % — ABNORMAL LOW (ref 36.0–46.0)
Hemoglobin: 8.4 g/dL — ABNORMAL LOW (ref 12.0–15.0)
Hemoglobin: 6.9 g/dL — ABNORMAL LOW (ref 12.0–15.0)
Hemoglobin: 8.7 g/dL — ABNORMAL LOW (ref 12.0–15.0)

## 2021-12-19 LAB — GLUCOSE, CAPILLARY
Glucose-Capillary: 127 mg/dL — ABNORMAL HIGH (ref 70–99)
Glucose-Capillary: 153 mg/dL — ABNORMAL HIGH (ref 70–99)
Glucose-Capillary: 163 mg/dL — ABNORMAL HIGH (ref 70–99)
Glucose-Capillary: 181 mg/dL — ABNORMAL HIGH (ref 70–99)
Glucose-Capillary: 203 mg/dL — ABNORMAL HIGH (ref 70–99)
Glucose-Capillary: 247 mg/dL — ABNORMAL HIGH (ref 70–99)

## 2021-12-19 LAB — BLOOD GAS, VENOUS
Acid-base deficit: 0.4 mmol/L (ref 0.0–2.0)
Bicarbonate: 25.4 mmol/L (ref 20.0–28.0)
O2 Saturation: 90 %
Patient temperature: 37
pCO2, Ven: 45 mmHg (ref 44–60)
pH, Ven: 7.36 (ref 7.25–7.43)
pO2, Ven: 58 mmHg — ABNORMAL HIGH (ref 32–45)

## 2021-12-19 LAB — PREPARE RBC (CROSSMATCH)

## 2021-12-19 LAB — PHOSPHORUS: Phosphorus: 5.6 mg/dL — ABNORMAL HIGH (ref 2.5–4.6)

## 2021-12-19 LAB — LACTIC ACID, PLASMA
Lactic Acid, Venous: 4.5 mmol/L (ref 0.5–1.9)
Lactic Acid, Venous: 2.6 mmol/L (ref 0.5–1.9)

## 2021-12-19 LAB — MAGNESIUM: Magnesium: 1.7 mg/dL (ref 1.7–2.4)

## 2021-12-19 MED ORDER — SODIUM CHLORIDE 0.9% IV SOLUTION: Freq: Once | INTRAVENOUS | Status: DC

## 2021-12-19 MED ORDER — LACTATED RINGERS IV BOLUS
1000.0000 mL | Freq: Once | INTRAVENOUS | Status: AC
  Administered 2021-12-19: 1000 mL via INTRAVENOUS

## 2021-12-19 MED ORDER — INSULIN ASPART 100 UNIT/ML IJ SOLN
0.0000 [IU] | INTRAMUSCULAR | Status: DC
  Administered 2021-12-19: 2 [IU] via SUBCUTANEOUS
  Administered 2021-12-20: 1 [IU] via SUBCUTANEOUS
  Administered 2021-12-20 (×2): 2 [IU] via SUBCUTANEOUS
  Administered 2021-12-20: 1 [IU] via SUBCUTANEOUS
  Administered 2021-12-20: 2 [IU] via SUBCUTANEOUS
  Administered 2021-12-21 (×2): 1 [IU] via SUBCUTANEOUS
  Administered 2021-12-21: 2 [IU] via SUBCUTANEOUS
  Administered 2021-12-21: 3 [IU] via SUBCUTANEOUS
  Administered 2021-12-22: 2 [IU] via SUBCUTANEOUS
  Administered 2021-12-22 (×2): 1 [IU] via SUBCUTANEOUS
  Administered 2021-12-22: 2 [IU] via SUBCUTANEOUS
  Administered 2021-12-23 (×2): 1 [IU] via SUBCUTANEOUS
  Filled 2021-12-19 (×16): qty 1

## 2021-12-19 MED ORDER — MAGNESIUM SULFATE 2 GM/50ML IV SOLN
2.0000 g | Freq: Once | INTRAVENOUS | Status: AC
  Administered 2021-12-19: 2 g via INTRAVENOUS
  Filled 2021-12-19: qty 50

## 2021-12-19 MED ORDER — ENSURE ENLIVE PO LIQD
237.0000 mL | Freq: Three times a day (TID) | ORAL | Status: DC
  Administered 2021-12-19 – 2021-12-22 (×6): 237 mL via ORAL

## 2021-12-19 NOTE — Progress Notes (Signed)
PT Cancellation Note  Patient Details Name: Shannon Chen MRN: [id number] DOB: [birth year]   Cancelled Treatment:     PT evaluation/Re-evaluation and treatment not completed 2/2 to as per nurse it is contraindicated to move LLE due to discharge and pressure has to be maintained at all time. PT will attempt again  tomorrow if pt is appropriate.   Joaquin Music PT DPT 3:31 PM,12/19/21

## 2021-12-19 NOTE — Progress Notes (Signed)
PHARMACY CONSULT NOTE - FOLLOW UP  Pharmacy Consult for Electrolyte Monitoring and Replacement   Recent Labs: Potassium (mmol/L)  Date Value  12/19/2021 4.1   Magnesium (mg/dL)  Date Value  12/19/2021 1.7   Calcium (mg/dL)  Date Value  12/19/2021 6.9 (L)   Albumin (g/dL)  Date Value  12/13/2021 2.9 (L)   Phosphorus (mg/dL)  Date Value  12/19/2021 5.6 (H)   Sodium (mmol/L)  Date Value  12/19/2021 141   Corrected calcium: 7.8 mg/dL  Assessment: 76 year old female admitted with illiac artery aneurysm, rhabdomyolysis and post-operative shock. PMH includes  CAD, HOCM, AV block s/p PPM, hypertension, hyperlipidemia, prior CVA. On IVF, pressor support.  Goal of Therapy:  K > 4 Mag > 2 All other electrolytes within normal limits  Plan:  Mag 1.7. Will give Magnesium sulfate IV 2 grams x 1 Follow up labs in AM  Wynelle Cleveland ,PharmD Clinical Pharmacist 12/19/2021 8:12 AM

## 2021-12-19 NOTE — Progress Notes (Signed)
NP Arna Medici at bedside to assess patient. Advised RN to maintain left femoral pressure dressing until Friday when patient to be reassessed by vascular.

## 2021-12-19 NOTE — Evaluation (Signed)
Occupational Therapy Evaluation Patient Details Name: Shannon Chen MRN: [id number] DOB: 1945-03-28 Today's Date: 12/19/2021   History of Present Illness Patient is a 76 year old female with medical history significant of CAD, HOCM, AV block s/p PPM, hypertension, hyperlipidemia, prior CVA who presents to the ED with complaints of bilateral lower extremity weakness and transient numbness in legs. Now s/p endovascular repair 12/18/21, s/p surgery post op shock.   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 Pt endorses 10/10 BLE pain. Pt significantly limited with bed mobility this date 2/2 pain despite premedicated. Pt endorsing nausea after boosting up slightly in bed. Declines additional attempts at bed mobility 2/2 pain and nausea. RN aware. Pt now requiring MAX A for bed level LB ADL, MOD A for bed level UB ADL, and set up and supv for bed level grooming tasks. Continue to recommend SNF at this time. Goals reviewed and updated to reflect functional changes following surgery.       Recommendations for follow up therapy are one component of a multi-disciplinary discharge planning process, led by the attending physician.  Recommendations may be updated based on patient status, additional functional criteria and insurance authorization.   Follow Up Recommendations  Skilled nursing-short term rehab (<3 hours/day)    Assistance Recommended at Discharge Frequent or constant Supervision/Assistance  Patient can return home with the following Direct supervision/assist for medications management;Help with stairs or ramp for entrance;Assistance with cooking/housework;A lot of help with walking and/or transfers;A lot of help with bathing/dressing/bathroom;Assist for transportation    Functional Status Assessment  Patient has had a recent decline in their functional status and demonstrates the ability to make significant improvements in function  in a reasonable and predictable amount of time.  Equipment Recommendations  Other (comment) (defer to next venue)    Recommendations for Other Services       Precautions / Restrictions Precautions Precautions: Fall Restrictions Weight Bearing Restrictions: No RLE Weight Bearing: Weight bearing as tolerated      Mobility Bed Mobility Overal bed mobility: Needs Assistance Bed Mobility: Rolling Rolling: Max assist         General bed mobility comments: MAX A for rolling, scooting up in bed with pt able to assist minimally but ultimately too pain limited    Transfers                   General transfer comment: unable to attempt      Balance                                           ADL either performed or assessed with clinical judgement   ADL Overall ADL's : Needs assistance/impaired                                       General ADL Comments: Pt currently requires MAX A for all LB ADL, MIN-MOD A UB ADL, set up and supv for grooming tasks at bed level     Vision         Perception     Praxis      Pertinent Vitals/Pain Pain Assessment Pain Assessment: 0-10 Pain Score: 10-Worst pain ever Pain Location: BLE Pain Descriptors / Indicators: Sharp Pain Intervention(s): Limited  activity within patient's tolerance, Monitored during session, Premedicated before session, Repositioned     Hand Dominance Right   Extremity/Trunk Assessment Upper Extremity Assessment Upper Extremity Assessment: Generalized weakness   Lower Extremity Assessment Lower Extremity Assessment: Generalized weakness;RLE deficits/detail;LLE deficits/detail RLE Deficits / Details: limited assessment 2/2 pain from incisions LLE Deficits / Details: limited assessment 2/2 pain from incisions       Communication Communication Communication: No difficulties   Cognition Arousal/Alertness: Lethargic Behavior During Therapy: WFL for tasks  assessed/performed Overall Cognitive Status: No family/caregiver present to determine baseline cognitive functioning                                 General Comments: Pt follows commands well, some confusion noted (pt states MD told her she has to remain in the bed for 4 days until the pain is gone)     General Comments       Exercises     Shoulder Instructions      Home Living Family/patient expects to be discharged to:: Group home                                        Prior Functioning/Environment Prior Level of Function : Needs assist             Mobility Comments: patient has rolling walker and cane but rarely uses them. typically independent with mobility ADLs Comments: she reports needing assistance with ADLs recently due to weakness, otherwise can perform ADL independently. she reports medication and meal assistance at the group home        OT Problem List: Decreased strength;Decreased activity tolerance;Impaired balance (sitting and/or standing);Decreased range of motion;Pain      OT Treatment/Interventions: Self-care/ADL training;Therapeutic exercise;Patient/family education;Balance training;Energy conservation;Therapeutic activities;DME and/or AE instruction    OT Goals(Current goals can be found in the care plan section) Acute Rehab OT Goals Patient Stated Goal: go to rehab to get better OT Goal Formulation: With patient Time For Goal Achievement: 01/02/22 Potential to Achieve Goals: Good  OT Frequency: Min 2X/week    Co-evaluation              AM-PAC OT "6 Clicks" Daily Activity     Outcome Measure Help from another person eating meals?: A Little Help from another person taking care of personal grooming?: A Little Help from another person toileting, which includes using toliet, bedpan, or urinal?: Total Help from another person bathing (including washing, rinsing, drying)?: A Lot Help from another person to put on  and taking off regular upper body clothing?: A Lot Help from another person to put on and taking off regular lower body clothing?: A Lot 6 Click Score: 13   End of Session Nurse Communication: Mobility status  Activity Tolerance: Patient limited by pain Patient left: in bed;with call bell/phone within reach  OT Visit Diagnosis: Unsteadiness on feet (R26.81);Muscle weakness (generalized) (M62.81);Pain Pain - Right/Left: Left (both) Pain - part of body: Leg                Time: 1610-9604 OT Time Calculation (min): 20 min Charges:  OT General Charges $OT Visit: 1 Visit OT Evaluation $OT Re-eval: 1 Re-eval  Arman Filter., MPH, MS, OTR/L ascom [phone] 12/19/21, 10:03 AM

## 2021-12-19 NOTE — Progress Notes (Signed)
No orders placed by vascular for removal of right femoral PAD. Secure chat message sent to request orders. When standard orders for removal are placed, protocol is to remove PAD 24 hours post surgical intervention. No new orders at this time. Post op note placed 12/18/21 as 12:56 by MD Dew and 12:51 by MD Schnier. If  no new orders placed for PAD removal instructions, will remove around 1300 today if site remains stable with hemostasis.

## 2021-12-19 NOTE — Progress Notes (Signed)
10 beat run of Vtach reported to this RN from [place]. This RN was at bedside during episode. Patient remained asymptomatic while resting in bed. All other vitals WDL at the time. Patient stated no distress felt. Will continue to monitor closely.

## 2021-12-19 NOTE — Progress Notes (Signed)
[facility]  Daily Progress Note   Subjective  -   Patient is alert and oriented this morning but complains of being uncomfortable and in pain  Objective Vitals:   12/19/21 0830 12/19/21 0900 12/19/21 0930 12/19/21 1000  BP: 128/65 (!) 148/45 109/74 (!) 137/42  Pulse: 82 78 84 81  Resp: 18 15 14 15   Temp:      TempSrc:      SpO2:  100% 100% 100%  Weight:      Height:        Intake/Output Summary (Last 24 hours) at 12/19/2021 1108 Last data filed at 12/19/2021 1049 Gross per 24 hour  Intake 8067.05 ml  Output 2695 ml  Net 5372.05 ml    PULM  CTAB CV  RRR VASC  right foot warm with cooler left foot.  Pulses dopplerable  Laboratory CBC    Component Value Date/Time   WBC 18.2 (H) 12/19/2021 0449   HGB 7.8 (L) 12/19/2021 0449   HCT 23.8 (L) 12/19/2021 0449   PLT 138 (L) 12/19/2021 0449    BMET    Component Value Date/Time   NA 141 12/19/2021 0449   K 4.1 12/19/2021 0449   CL 110 12/19/2021 0449   CO2 24 12/19/2021 0449   GLUCOSE 131 (H) 12/19/2021 0449   BUN 45 (H) 12/19/2021 0449   CREATININE 1.20 (H) 12/19/2021 0449   CALCIUM 6.9 (L) 12/19/2021 0449   GFRNONAA 46 (L) 12/19/2021 0449   GFRAA >60 11/18/2019 6384    Assessment/Planning: POD #1 s/p Endovascular AAA repair with illiac artery aneurysm repair   Patient continued to have some pain and discomfort this morning, continuing with pain management. Patient's pulses are dopplerable this morning with no complaints of ischemia.  Patient remains on neo drip, recommend titration as patient's blood pressure has been stable. Patient had some oozing from her left groin site.  Continue to maintain pressure dressing until tomorrow  Kris Hartmann  12/19/2021, 11:08 AM

## 2021-12-19 NOTE — Consult Note (Signed)
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 VASC surgery consulted s/p endovascular repair, s/p surgery post op shock, PCCM asked to assess  History of Present Illness:  76 y.o. female with medical history significant of CAD, HOCM, AV block s/p PPM, hypertension, hyperlipidemia, prior CVA who presents to the ED with complaints of bilateral lower extremity weakness.   5 days PTA,  She noticed during the shower that she was unable to feel her legs and she was experiencing difficulty walking due to this.    She was able to make it out of the shower and since this time, her symptoms have persisted.    Due to this, she is no longer to ambulate independently.   She describes being unable to feel where her legs are and unable to lift her legs up.    She denies any numbness to touch, tingling or burning sensation.  She denies any back pain, recent falls or trauma.  She denies any recent illness including fever, chills.  She denies any rectal or genital numbness, urinary or bowel incontinence.     ED Course:  On arrival to the ED, patient was normotensive at 116/59 with heart rate of 64.  She was afebrile 97.8.  She was saturating at 95% on room air.Initial work-up remarkable for bicarb of 20, glucose of 100, BUN of 40, creatinine of 2.08.  Troponin elevated at 118 with a downtrend to 113 consistent with patient's prior troponin levels.  CBC unremarkable.  Urinalysis demonstrated large hemoglobin and proteinuria.  CT head with no acute intracranial abnormality.  MRI contraindicated due to PPM.  TRH contacted for admission for further  evaluation.       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  10/30 left thigh biopsy for myopathy  11/1 Planned vasc repair for AAA 11/1 post op shock and placed on pressors, severe acidosis 11/2 remains on pressors, alert and awake    Pertinent  Medical History   FINDING(S): 1.  AAA and large left common iliac artery aneurysm 2.  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   PROCEDURE: US guidance for vascular access, bilateral femoral arteries as well as left superficial femoral artery Catheter placement into aorta from bilateral femoral approaches Placement of a 28 mm diameter proximal 12 cm length Gore Excluder Endoprosthesis main body left with a 14 mm diameter by 10 cm length right iliac contralateral limb Placement of a 12 mm diameter by 12 cm length left iliac extension limb down to the left external iliac artery to treat and get beyond the aneurysmal disease in the left common iliac artery Placement of an 8 mm diameter by 8 cm length right external iliac artery life star stent for occlusive disease Placement of an 8 mm diameter by 7.5 cm length  Viabahn stent in the left common femoral artery and distal external iliac artery for occlusive disease and extravasation ProGlide closure devices right femoral artery       Micro Data:  10/23 COVID NEG  Antimicrobials:   Antibiotics Given (last 72 hours)     Date/Time Action Medication Dose Rate   12/18/21 0916 Given   ceFAZolin (ANCEF) IVPB 2g/100 mL premix 2 g    12/18/21  2002 New Bag/Given   ceFAZolin (ANCEF) IVPB 2g/100 mL premix 2 g 200 mL/hr   12/19/21 0336 New Bag/Given   ceFAZolin (ANCEF) IVPB 2g/100 mL premix 2 g 200 mL/hr     ' EVENTS OVERNIGHT On pressors Alert and awake Complaining of foley     Latest Ref Rng & Units 12/19/2021    4:49 AM 12/18/2021    8:48 PM 12/18/2021    1:49 PM  BMP  Glucose 70 - 99 mg/dL 131  168  278   BUN 8 - 23 mg/dL 45  44  42   Creatinine 0.44 - 1.00 mg/dL 1.20  0.99  1.11   Sodium 135 - 145 mmol/L 141  142  138   Potassium 3.5 - 5.1 mmol/L 4.1  4.6  4.9   Chloride 98 - 111 mmol/L 110  112  114   CO2 22 - 32 mmol/L 24  22  16    Calcium 8.9 - 10.3 mg/dL 6.9  6.9  6.6     LA 3.1--3.4--4.5--2.6         Objective   Blood pressure 113/64, pulse 84, temperature 100 F (37.8 C), temperature source Axillary, resp. rate 18, height 5' 5.5" (1.664 m), weight 64.9 kg, SpO2 100 %.        Intake/Output Summary (Last 24 hours) at 12/19/2021 0725 Last data filed at 12/19/2021 0645 Gross per 24 hour  Intake 6711.49 ml  Output 2595 ml  Net 4116.49 ml    Filed Weights   12/09/21 0952  Weight: 64.9 kg       Review of Systems: Gen:  +dsitress +pain HEENT: Denies blurred vision, double vision, ear pain, eye pain, hearing loss, nose bleeds, sore throat Cardiac:  No dizziness, chest pain or heaviness, chest tightness,edema, No JVD Resp:   No cough, -sputum production, -shortness of breath,-wheezing, -hemoptysis,  Other:  All other systems negative   Physical Examination:   General Appearance: minimal  distress  EYES PERRLA, EOM intact.   NECK Supple, No JVD Pulmonary: normal breath sounds, No wheezing.  CardiovascularNormal S1,S2.  No m/r/g.   Abdomen: Benign, Soft, non-tender. SKIN:normal, warm to touch, Capillary refill delayed  Pulses present bilaterally PAD/s in place ALL OTHER ROS ARE NEGATIVE  Labs/imaging that I havepersonally reviewed  (right click and "Reselect all SmartList Selections"  daily)      ASSESSMENT AND PLAN SYNOPSIS  76 yo AAF with Multiple medical issues to be transferred to ICU for s/p AAA repair with severe PAD with post op shock likely related to blood loss and hypovolemia with general anesthesia with progressive metabolic acidosis, renal failure   shock -use vasopressors to keep MAP>65 as needed Wean neo as tolerated -follow up cultures  IV fluid Resuscitation  S/p AAA repair-monitor for bleeding Follow up H/H stable after 2 units   CARDIAC ICU monitoring  ACUTE KIDNEY INJURY/Renal Failure -continue Foley Catheter-assess need -Avoid nephrotoxic agents -Follow urine output, BMP -Ensure adequate renal perfusion, optimize oxygenation -Renal dose medications   Intake/Output Summary (Last 24 hours) at 12/19/2021 0730 Last data filed  at 12/19/2021 0645 Gross per 24 hour  Intake 6711.49 ml  Output 2595 ml  Net 4116.49 ml      Latest Ref Rng & Units 12/19/2021    4:49 AM 12/18/2021    8:48 PM 12/18/2021    1:49 PM  BMP  Glucose 70 - 99 mg/dL 841  660  630   BUN 8 - 23 mg/dL 45  44  42   Creatinine 0.44 - 1.00 mg/dL 1.60  1.09  3.23   Sodium 135 - 145 mmol/L 141  142  138   Potassium 3.5 - 5.1 mmol/L 4.1  4.6  4.9   Chloride 98 - 111 mmol/L 110  112  114   CO2 22 - 32 mmol/L 24  22  16    Calcium 8.9 - 10.3 mg/dL 6.9  6.9  6.6     INFECTIOUS DISEASE -follow up cultures  ENDO - ICU hypoglycemic\Hyperglycemia protocol -check FSBS per protocol   GI GI PROPHYLAXIS as indicated  NUTRITIONAL STATUS DIET-->NPO Constipation protocol as indicated  ACUTE ANEMIA- TRANSFUSE AS NEEDED CONSIDER TRANSFUSION  IF HGB<7 DVT PRX with TED/SCD's ONLY  ELECTROLYTES -follow labs as needed -replace as needed -pharmacy consultation and following    Best practice (right click and "Reselect all SmartList Selections" daily)  Diet: NPO Mobility:  bed rest  Code Status:  FULL Disposition:ICU  Labs   CBC: Recent Labs  Lab 12/16/21 0444  12/17/21 0434 12/18/21 0616 12/18/21 1349 12/18/21 2048 12/19/21 0104 12/19/21 0449  WBC 12.1* 13.2* 10.6* 16.4*  --   --  18.2*  HGB 10.8* 9.4* 9.0* 8.9* 9.8* 8.4* 7.8*  HCT 34.3* 29.9* 29.1* 28.4* 29.5* 25.8* 23.8*  MCV 86.0 84.7 87.9 93.7  --   --  89.5  PLT 212 199 201 121*  --   --  138*     Basic Metabolic Panel: Recent Labs  Lab 12/16/21 0444 12/17/21 0434 12/18/21 0616 12/18/21 1349 12/18/21 2048 12/19/21 0449  NA 141 139 138 138 142 141  K 3.9 4.1 4.1 4.9 4.6 4.1  CL 107 106 108 114* 112* 110  CO2 24 26 25  16* 22 24  GLUCOSE 99 130* 89 278* 168* 131*  BUN 47* 56* 52* 42* 44* 45*  CREATININE 0.96 1.25* 1.18* 1.11* 0.99 1.20*  CALCIUM 9.0 8.4* 8.5* 6.6* 6.9* 6.9*  MG 2.1 2.0 2.1 1.8  --  1.7  PHOS  --   --   --   --   --  5.6*    GFR: Estimated Creatinine Clearance: 35.5 mL/min (A) (by C-G formula based on SCr of 1.2 mg/dL (H)). Recent Labs  Lab 12/17/21 0434 12/18/21 0616 12/18/21 1349 12/18/21 1820 12/18/21 2048 12/19/21 0104 12/19/21 0449  WBC 13.2* 10.6* 16.4*  --   --   --  18.2*  LATICACIDVEN  --   --   --  3.1* 3.4* 4.5* 2.6*     Liver Function Tests: Recent Labs  Lab 12/13/21 0436  AST 56*  ALT 95*  ALKPHOS 79  BILITOT 0.8  PROT 6.5  ALBUMIN 2.9*    No results for input(s): "LIPASE", "AMYLASE" in the last 168 hours. No results for input(s): "AMMONIA" in the last 168 hours.  ABG    Component Value Date/Time   HCO3 25.4 12/19/2021 0449   ACIDBASEDEF 0.4 12/19/2021 0449   O2SAT 90 12/19/2021 0449     Coagulation Profile: No results for input(s): "INR", "PROTIME" in the last 168 hours.  Cardiac Enzymes: Recent Labs  Lab 12/13/21 0436  CKTOTAL 2,301*     HbA1C: Hgb A1c MFr Bld  Date/Time Value Ref Range Status  12/09/2021 10:11 PM 6.4 (H) 4.8 - 5.6 % Final    Comment:    (NOTE) Pre diabetes:          5.7%-6.4%  Diabetes:              >6.4%  Glycemic control for   <7.0% adults with diabetes   12/12/2020 10:24  PM 6.7 (H) 4.8 - 5.6 % Final    Comment:    (NOTE) Pre diabetes:          5.7%-6.4%  Diabetes:              >6.4%  Glycemic control for   <7.0% adults with diabetes     CBG: Recent Labs  Lab 12/17/21 1121 12/17/21 1620 12/17/21 2114 12/18/21 0741 12/18/21 2148  GLUCAP 192* 198* 192* 85 145*      DVT/GI PRX  assessed I Assessed the need for Labs I Assessed the need for Foley I Assessed the need for Central Venous Line Family Discussion when available I Assessed the need for Mobilization I made an Assessment of medications to be adjusted accordingly Safety Risk assessment completed  CASE DISCUSSED IN MULTIDISCIPLINARY ROUNDS WITH ICU TEAM   Critical Care Time devoted to patient care services described in this note is 45 minutes.  Critical care was necessary to treat /prevent imminent   Lucie Leather, M.D.  [facility]  Medical Director [facility] Medical Director [facility] Cardio-Pulmonary Department

## 2021-12-19 NOTE — Progress Notes (Signed)
Per MD Kasa secure chat: diet Advance as tolerated.  New order placed for clear liquids. Will advance to full liquids if tolerable.

## 2021-12-19 NOTE — Progress Notes (Signed)
PAD removed from right femoral site at 1415. Site level 0 with slight ecchymosis. Patient denies pain at site. Gauze applied to site. Gauze also applied to left thigh incision site due to scant amount of oozing from site.

## 2021-12-19 NOTE — Progress Notes (Signed)
Pt remains on Pressors at present. Will transfer to Dr Zoila Shutter service for now.  No charge

## 2021-12-20 DIAGNOSIS — R531 Weakness: Secondary | ICD-10-CM | POA: Diagnosis not present

## 2021-12-20 LAB — BPAM RBC
Blood Product Expiration Date: 202312052359
Blood Product Expiration Date: 202312052359
Blood Product Expiration Date: 202312072359
ISSUE DATE / TIME: 202311011144
ISSUE DATE / TIME: 202311011144
ISSUE DATE / TIME: 202311021339
Unit Type and Rh: 5100
Unit Type and Rh: 5100
Unit Type and Rh: 5100

## 2021-12-20 LAB — TYPE AND SCREEN
ABO/RH(D): O POS
Antibody Screen: NEGATIVE
Unit division: 0
Unit division: 0
Unit division: 0

## 2021-12-20 LAB — CBC
HCT: 22.2 % — ABNORMAL LOW (ref 36.0–46.0)
Hemoglobin: 7.4 g/dL — ABNORMAL LOW (ref 12.0–15.0)
MCH: 28.8 pg (ref 26.0–34.0)
MCHC: 33.3 g/dL (ref 30.0–36.0)
MCV: 86.4 fL (ref 80.0–100.0)
Platelets: 96 10*3/uL — ABNORMAL LOW (ref 150–400)
RBC: 2.57 MIL/uL — ABNORMAL LOW (ref 3.87–5.11)
RDW: 14.4 % (ref 11.5–15.5)
WBC: 16.7 10*3/uL — ABNORMAL HIGH (ref 4.0–10.5)
nRBC: 0.2 % (ref 0.0–0.2)

## 2021-12-20 LAB — PHOSPHORUS: Phosphorus: 3.5 mg/dL (ref 2.5–4.6)

## 2021-12-20 LAB — BASIC METABOLIC PANEL
Anion gap: 5 (ref 5–15)
BUN: 32 mg/dL — ABNORMAL HIGH (ref 8–23)
CO2: 25 mmol/L (ref 22–32)
Calcium: 7.8 mg/dL — ABNORMAL LOW (ref 8.9–10.3)
Chloride: 112 mmol/L — ABNORMAL HIGH (ref 98–111)
Creatinine, Ser: 0.84 mg/dL (ref 0.44–1.00)
GFR, Estimated: >60 mL/min (ref >60)
Glucose, Bld: 115 mg/dL — ABNORMAL HIGH (ref 70–99)
Potassium: 3.9 mmol/L (ref 3.5–5.1)
Sodium: 142 mmol/L (ref 135–145)

## 2021-12-20 LAB — GLUCOSE, CAPILLARY
Glucose-Capillary: 133 mg/dL — ABNORMAL HIGH (ref 70–99)
Glucose-Capillary: 108 mg/dL — ABNORMAL HIGH (ref 70–99)
Glucose-Capillary: 139 mg/dL — ABNORMAL HIGH (ref 70–99)
Glucose-Capillary: 177 mg/dL — ABNORMAL HIGH (ref 70–99)
Glucose-Capillary: 195 mg/dL — ABNORMAL HIGH (ref 70–99)
Glucose-Capillary: 167 mg/dL — ABNORMAL HIGH (ref 70–99)

## 2021-12-20 LAB — MAGNESIUM: Magnesium: 2.3 mg/dL (ref 1.7–2.4)

## 2021-12-20 LAB — HEMOGLOBIN AND HEMATOCRIT, BLOOD
HCT: 23 % — ABNORMAL LOW (ref 36.0–46.0)
Hemoglobin: 8 g/dL — ABNORMAL LOW (ref 12.0–15.0)

## 2021-12-20 MED ORDER — PANTOPRAZOLE SODIUM 40 MG PO TBEC
40.0000 mg | DELAYED_RELEASE_TABLET | Freq: Every day | ORAL | Status: DC
  Administered 2021-12-20 – 2021-12-23 (×4): 40 mg via ORAL
  Filled 2021-12-20 (×4): qty 1

## 2021-12-20 MED ORDER — PHENTOLAMINE MESYLATE 5 MG IJ SOLR
5.0000 mg | Freq: Once | INTRAMUSCULAR | Status: AC
  Administered 2021-12-20: 5 mg via SUBCUTANEOUS
  Filled 2021-12-20: qty 5

## 2021-12-20 MED ORDER — MORPHINE SULFATE (PF) 2 MG/ML IV SOLN: 1.0000 mg | INTRAVENOUS | Status: DC | PRN

## 2021-12-20 NOTE — Plan of Care (Signed)
  Problem: Education: Goal: Knowledge of General Education information will improve Description: Including pain rating scale, medication(s)/side effects and non-pharmacologic comfort measures Outcome: Progressing   Problem: Health Behavior/Discharge Planning: Goal: Ability to manage health-related needs will improve Outcome: Progressing   Problem: Clinical Measurements: Goal: Ability to maintain clinical measurements within normal limits will improve Outcome: Progressing Goal: Will remain free from infection Outcome: Progressing Goal: Diagnostic test results will improve Outcome: Progressing Goal: Respiratory complications will improve Outcome: Progressing Goal: Cardiovascular complication will be avoided Outcome: Progressing   Problem: Activity: Goal: Risk for activity intolerance will decrease Outcome: Progressing   Problem: Nutrition: Goal: Adequate nutrition will be maintained Outcome: Progressing   Problem: Coping: Goal: Level of anxiety will decrease Outcome: Progressing   Problem: Elimination: Goal: Will not experience complications related to bowel motility Outcome: Progressing Goal: Will not experience complications related to urinary retention Outcome: Progressing   Problem: Pain Managment: Goal: General experience of comfort will improve Outcome: Progressing   Problem: Safety: Goal: Ability to remain free from injury will improve Outcome: Progressing   Problem: Skin Integrity: Goal: Risk for impaired skin integrity will decrease Outcome: Progressing   Problem: Education: Goal: Ability to describe self-care measures that may prevent or decrease complications (Diabetes Survival Skills Education) will improve Outcome: Progressing Goal: Individualized Educational Video(s) Outcome: Progressing   Problem: Coping: Goal: Ability to adjust to condition or change in health will improve Outcome: Progressing   Problem: Fluid Volume: Goal: Ability to  maintain a balanced intake and output will improve Outcome: Progressing   Problem: Health Behavior/Discharge Planning: Goal: Ability to identify and utilize available resources and services will improve Outcome: Progressing Goal: Ability to manage health-related needs will improve Outcome: Progressing   Problem: Metabolic: Goal: Ability to maintain appropriate glucose levels will improve Outcome: Progressing   Problem: Nutritional: Goal: Maintenance of adequate nutrition will improve Outcome: Progressing Goal: Progress toward achieving an optimal weight will improve Outcome: Progressing   Problem: Skin Integrity: Goal: Risk for impaired skin integrity will decrease Outcome: Progressing   Problem: Tissue Perfusion: Goal: Adequacy of tissue perfusion will improve Outcome: Progressing   Problem: Education: Goal: Knowledge of discharge needs will improve Outcome: Progressing   Problem: Clinical Measurements: Goal: Postoperative complications will be avoided or minimized Outcome: Progressing   Problem: Respiratory: Goal: Ability to achieve and maintain a regular respiratory rate will improve Outcome: Progressing   Problem: Skin Integrity: Goal: Demonstration of wound healing without infection will improve Outcome: Progressing

## 2021-12-20 NOTE — Evaluation (Signed)
Physical Therapy Re-Evaluation Patient Details Name: Shannon Chen MRN: [id number] DOB: Jul 31, 1945 Today's Date: 12/20/2021  History of Present Illness  Patient is a 76 year old female with medical history significant of CAD, HOCM, AV block s/p PPM, hypertension, hyperlipidemia, prior CVA who presents to the ED with complaints of bilateral lower extremity weakness and transient numbness in legs. 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   Clinical Impression  Pt somewhat lethargic but easily awakened, able to follow all commands, and put forth fair effort during the session. Pt required physical assistance with bed mobility tasks and transfers along with cues for proper sequencing for decreased caregiver assistance.  Pt was able to take several small steps at the EOB and then from bed to chair but was unable to ambulate further secondary to fatigue.  Pt reported no adverse symptoms during the session other than LLE pain from her procedure with SpO2 and HR WNL or room air.  Pt will benefit from PT services in a SNF setting upon discharge to safely address deficits listed in patient problem list for decreased caregiver assistance and eventual return to PLOF.      Recommendations for follow up therapy are one component of a multi-disciplinary discharge planning process, led by the attending physician.  Recommendations may be updated based on patient status, additional functional criteria and insurance authorization.  Follow Up Recommendations Skilled nursing-short term rehab (<3 hours/day) Can patient physically be transported by private vehicle: No    Assistance Recommended at Discharge Frequent or constant Supervision/Assistance  Patient can return home with the following  A lot of help with walking and/or transfers;A lot of help with bathing/dressing/bathroom;Assistance with cooking/housework;Help with stairs or ramp for entrance;Assist  for transportation    Equipment Recommendations Other (comment) (TBD at next venue of care)  Recommendations for Other Services       Functional Status Assessment Patient has had a recent decline in their functional status and demonstrates the ability to make significant improvements in function in a reasonable and predictable amount of time.     Precautions / Restrictions Precautions Precautions: Fall Restrictions Weight Bearing Restrictions: No Other Position/Activity Restrictions: Per Dr. Posey Pronto and Dr. Lucky Cowboy ok to mobilize patient without restriction 12/20/21      Mobility  Bed Mobility Overal bed mobility: Needs Assistance       Supine to sit: Mod assist     General bed mobility comments: Mod A primarily for LLE management but also min A for trunk control    Transfers Overall transfer level: Needs assistance Equipment used: Rolling walker (2 wheels) Transfers: Sit to/from Stand Sit to Stand: From elevated surface, Mod assist, Min assist           General transfer comment: Mod verbal cues for hand placement and increased trunk flexion    Ambulation/Gait Ambulation/Gait assistance: Min guard Gait Distance (Feet): 3 Feet Assistive device: Rolling walker (2 wheels) Gait Pattern/deviations: Step-to pattern, Trunk flexed, Decreased step length - right, Decreased step length - left Gait velocity: decreased     General Gait Details: Mod verbal cues for amb closer to the RW with upright posture  Stairs            Wheelchair Mobility    Modified Rankin (Stroke Patients Only)       Balance Overall balance assessment: Needs assistance Sitting-balance support: Feet supported Sitting balance-Leahy Scale: Fair     Standing balance support: Bilateral upper extremity supported,  During functional activity, Reliant on assistive device for balance Standing balance-Leahy Scale: Fair                               Pertinent Vitals/Pain Pain  Assessment Pain Assessment: 0-10 Pain Score: 7  Pain Location: BLE Pain Descriptors / Indicators: Sore Pain Intervention(s): Premedicated before session, Monitored during session, Repositioned    Home Living Family/patient expects to be discharged to:: Group home                        Prior Function               Mobility Comments: Ind amb without an AD for the last 6 months, had been using a SPC prior to that ADLs Comments: Assist with ADLs recently due to weakness, otherwise can perform ADLs independently with medication and meal assistance at the group home     Hand Dominance   Dominant Hand: Right    Extremity/Trunk Assessment   Upper Extremity Assessment Upper Extremity Assessment: Generalized weakness    Lower Extremity Assessment Lower Extremity Assessment: Generalized weakness       Communication   Communication: No difficulties  Cognition Arousal/Alertness: Lethargic Behavior During Therapy: WFL for tasks assessed/performed Overall Cognitive Status: No family/caregiver present to determine baseline cognitive functioning                                          General Comments      Exercises Total Joint Exercises Ankle Circles/Pumps: AROM, Strengthening, Both, 10 reps Quad Sets: Strengthening, Both, 10 reps Gluteal Sets: Strengthening, Both, 10 reps Hip ABduction/ADduction: AAROM, Strengthening, Both, 5 reps Long Arc Quad: AROM, Strengthening, Both, 10 reps Knee Flexion: AROM, Strengthening, Both, 10 reps Other Exercises Other Exercises: HEP education for BLE APs, QS, and GS x 10 each every 1-2 hours daily   Assessment/Plan    PT Assessment Patient needs continued PT services  PT Problem List Decreased strength;Decreased activity tolerance;Decreased balance;Decreased mobility;Decreased knowledge of use of DME;Pain       PT Treatment Interventions DME instruction;Gait training;Stair training;Therapeutic  activities;Balance training;Therapeutic exercise;Patient/family education;Functional mobility training    PT Goals (Current goals can be found in the Care Plan section)  Acute Rehab PT Goals Patient Stated Goal: To walk better PT Goal Formulation: With patient Time For Goal Achievement: 01/02/22 Potential to Achieve Goals: Good    Frequency Min 2X/week     Co-evaluation               AM-PAC PT "6 Clicks" Mobility  Outcome Measure Help needed turning from your back to your side while in a flat bed without using bedrails?: A Lot Help needed moving from lying on your back to sitting on the side of a flat bed without using bedrails?: A Lot Help needed moving to and from a bed to a chair (including a wheelchair)?: A Lot Help needed standing up from a chair using your arms (e.g., wheelchair or bedside chair)?: A Lot Help needed to walk in hospital room?: Total Help needed climbing 3-5 steps with a railing? : Total 6 Click Score: 10    End of Session Equipment Utilized During Treatment: Gait belt Activity Tolerance: Patient tolerated treatment well Patient left: in chair;with call bell/phone within reach;with chair alarm set Nurse Communication: Mobility  status PT Visit Diagnosis: Unsteadiness on feet (R26.81);Muscle weakness (generalized) (M62.81);Difficulty in walking, not elsewhere classified (R26.2);Pain Pain - Right/Left: Left Pain - part of body: Leg    Time: 1520-1543 PT Time Calculation (min) (ACUTE ONLY): 23 min   Charges:   PT Evaluation $PT Re-evaluation: 1 Re-eval PT Treatments $Therapeutic Exercise: 8-22 mins       D. Scott Treshaun Carrico PT, DPT 12/20/21, 4:02 PM

## 2021-12-20 NOTE — Progress Notes (Signed)
Alerted by IV watch to check IV that had Neo running.  IV & site assessed, no blood return present when previously there was, edema also noted.  Britton-Lee, NP on call notified + pharmacist.  Vasopressor extravasation order set in place.  Will continue to monitor.

## 2021-12-20 NOTE — Progress Notes (Signed)
PHARMACY CONSULT NOTE - FOLLOW UP  Pharmacy Consult for Electrolyte Monitoring and Replacement   Recent Labs: Potassium (mmol/L)  Date Value  12/20/2021 3.9   Magnesium (mg/dL)  Date Value  12/20/2021 2.3   Calcium (mg/dL)  Date Value  12/20/2021 7.8 (L)   Albumin (g/dL)  Date Value  12/13/2021 2.9 (L)   Phosphorus (mg/dL)  Date Value  12/20/2021 3.5   Sodium (mmol/L)  Date Value  12/20/2021 142   Corrected calcium: 7.8 mg/dL  Assessment: 76 year old female admitted with illiac artery aneurysm, rhabdomyolysis and post-operative shock. PMH includes  CAD, HOCM, AV block s/p PPM, hypertension, hyperlipidemia, prior CVA. On IVF, pressor support.  Goal of Therapy:  K > 4 Mag > 2 All other electrolytes within normal limits  Plan:  No replacement warranted Follow up labs in AM  Wynelle Cleveland ,PharmD Clinical Pharmacist 12/20/2021 7:56 AM

## 2021-12-20 NOTE — Progress Notes (Signed)
[facility] [place] NAME: Shannon Chen    MR#:  [id number]  DATE OF BIRTH:  Oct 02, 1945  SUBJECTIVE:   Patient transferred out of ICU. Blood pressure is stable. She is off IV vasopressin. Complains of pain in her operative leg. Did work with physical therapy. No family at bedside. Patient does not want me to pursue calling any family members at present.  VITALS:  Blood pressure (!) 141/73, pulse 87, temperature 98 F (36.7 C), resp. rate 16, height 5' 5.5" (1.664 m), weight 64.9 kg, SpO2 95 %.  PHYSICAL EXAMINATION:   GENERAL:  76 y.o.-year-old patient lying in the bed with no acute distress. Chronically ill LUNGS: Normal breath sounds bilaterally, no wheezing CARDIOVASCULAR: S1, S2 normal. No murmurs,   ABDOMEN: Soft, nontender, nondistended. Bowel sounds present.  EXTREMITIES: edema+, left groin dressing removed NEUROLOGIC: nonfocal  patient is awake   LABORATORY PANEL:  CBC Recent Labs  Lab 12/20/21 0604  WBC 16.7*  HGB 7.4*  HCT 22.2*  PLT 96*     Chemistries  Recent Labs  Lab 12/20/21 0604  NA 142  K 3.9  CL 112*  CO2 25  GLUCOSE 115*  BUN 32*  CREATININE 0.84  CALCIUM 7.8*  MG 2.3     Assessment and Plan  Shannon Chen is a 76 year old with history of CAD, HOCM, AV block status post PPM, HTN, prior CVA admitted for bilateral lower extremity weakness..  About 5 days PTA she started experiencing bilateral lower extremity weakness and difficulty walking.  In the ED she was found to have rhabdomyolysis.  CT of the head, cervical thoracic and lumbar spine did not show any acute pathology.   Neuro consulted, due to concern for myopathy as paraneoplastic syndromes, CT chest/abdomen/pelvis w/ contrast performed for malignancy screening which found incidental vascular renal findings as below.  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   Sudden onset about 5 days PTA.  Reported numbness and weakness but no pain.  CT head, cervical, lumbar and thoracic spine are unremarkable.  Unable to get MRI due to pacemaker.   --Neurology consulted with Dr. Curly Shores.  CK elevated at 6906, folate, B12 low normal, TSH normal.  CRP wnl, ESR only mildly elevated at 37. --Vit D 15 (low), which may contribute to proximal myopathy. --Continue 65 mg daily prednisone for 4-6 weeks (until rheumatology follow-up), taper per outpatient provider and pending clinical course and workup  --cont protonix and Ca supplementation while on steroid -outpatient rheumatologist/neurologist to consider pneumocystis jiroveci prophylaxis if high dose steroids will be continued longer term -Pending: serum myoglobin--4189 myositis panel, , MMA --514 -cont Vitamin D repletion, 50,000 units weekly for 8 weeks --cont vit B12 supplement --Continue to hold statin --f/u muscle biopsy result to assess for PM.   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  This places her at a very high risk for lethal rupture primarily from the left common iliac artery aneurysm.  The patient is a candidate for endovascular repair.  --Stent graft placement with Dr. Levy Pupa on 11/1--complex procedure -- discussed with Dr. Lucky Cowboy okay to start  physical therapy.  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 Delana Meyer, will need to wait 1-2 months after the abdominal repair to minimize the risk of paraplegia   9 mm nodule in left kidney --interval increase in size.  Raises possibility for paraneoplastic syndrome as  presented with PM, however, CT renal protocal w/wo contrast showed the nodule to be benign.  --Oncology consulted, with Dr. Tasia Catchings   Acute kidney injury superimposed on CKD ([place]) Baseline creatinine 0.9.  Admission creatinine 2.08, slowly improving with IV fluids, resolved now.   Hypertension Hypotension post procedure - hold amlodipine and Toprol -- now off IV vasopressin. Blood pressure meds   Hypertrophic cardiomyopathy ([place]) Patient is euvolemic at this time.    Type 2 diabetes mellitus with hyperlipidemia [facility]) She is on [place] for heart failure.  Hemoglobin A1c 6.4 --SSI while on steroid   Chronic obstructive pulmonary disease ([name]) --cont daily bronchodilators     DVT prophylaxis: Lovenox SQ Code Status: Full code  Family Communication: none today Level of care: Med-Surg Dispo:   The patient is from: home Anticipated d/c is to: SNF rehab      TOTAL TIME TAKING CARE OF THIS PATIENT: 35 minutes.  >50% time spent on counselling and coordination of care  Note: This dictation was prepared with Dragon dictation along with smaller phrase technology. Any transcriptional errors that result from this process are unintentional.  Fritzi Mandes M.D    [facility]   CC: Primary care physician; Remi Haggard, FNP

## 2021-12-20 NOTE — Progress Notes (Signed)
Report received from ICU RN at 11am. A+Ox3. VSS. Patient transferred. Needs and call bell at reach.  Will continue to monitor and assess with plan of care.

## 2021-12-20 NOTE — TOC Progression Note (Signed)
Transition of Care [facility]) - Progression Note    Patient Details  Name: Shannon Chen MRN: [id number] Date of Birth: 1946/01/02  Transition of Care [facility]) CM/SW [place], RN Phone Number: 12/20/2021, 4:27 PM  Clinical Narrative:     The patient is now medically ready and Ins auth restarted to go to [facility] at [place]       Expected Discharge Plan and Services                                                 Social Determinants of Health (SDOH) Interventions    Readmission Risk Interventions    12/10/2021    2:16 PM 12/15/2020    4:10 PM  Readmission Risk Prevention Plan  Transportation Screening Complete Complete  PCP or Specialist Appt within 5-7 Days  Complete  Home Care Screening  Complete  Medication Review (RN CM)  Complete  HRI or Home Care Consult Complete   Social Work Consult for [place] Planning/Counseling Complete   Palliative Care Screening Not Applicable   Medication Review [employer] photographer) Complete

## 2021-12-20 NOTE — Progress Notes (Signed)
[facility]  Daily Progress Note   Subjective  - 2 Days Post-Op  Patient sitting up in a chair next to her bed.  She is finished with her supper and has done well with her food.  She states that she is in less pain she did therapy today and she is actually able to move her feet  Objective Vitals:   12/20/21 0800 12/20/21 0900 12/20/21 1135 12/20/21 1444  BP: (!) 117/48 (!) 135/57 (!) 135/50 (!) 141/73  Pulse: 84 98 80 87  Resp: 13 15 16 16   Temp:   98.3 F (36.8 C) 98 F (36.7 C)  TempSrc:      SpO2: 99% (!) 89% 95% 95%  Weight:      Height:        Intake/Output Summary (Last 24 hours) at 12/20/2021 1853 Last data filed at 12/20/2021 1844 Gross per 24 hour  Intake 133.73 ml  Output 1300 ml  Net -1166.27 ml    PULM  Normal effort , no use of accessory muscles CV  No JVD, RRR Abd      No distended, nontender VASC  groins clean dry and intact both feet are warm and hyperemic.  There is 2+ edema  Laboratory CBC    Component Value Date/Time   WBC 16.7 (H) 12/20/2021 0604   HGB 7.4 (L) 12/20/2021 0604   HCT 22.2 (L) 12/20/2021 0604   PLT 96 (L) 12/20/2021 0604    BMET    Component Value Date/Time   NA 142 12/20/2021 0604   K 3.9 12/20/2021 0604   CL 112 (H) 12/20/2021 0604   CO2 25 12/20/2021 0604   GLUCOSE 115 (H) 12/20/2021 0604   BUN 32 (H) 12/20/2021 0604   CREATININE 0.84 12/20/2021 0604   CALCIUM 7.8 (L) 12/20/2021 0604   GFRNONAA >60 12/20/2021 0604   GFRAA >60 11/18/2019 6213    Assessment/Planning: POD #2 s/p endovascular repair of bilateral common iliac artery aneurysms and associated with aortic aneurysm as well as profound atherosclerotic occlusive disease  Continue supportive care.  Continue work-up of muscle weakness.  Muscle biopsy pathology still pending   [name]  12/20/2021, 6:53 PM

## 2021-12-21 DIAGNOSIS — R531 Weakness: Secondary | ICD-10-CM | POA: Diagnosis not present

## 2021-12-21 LAB — GLUCOSE, CAPILLARY
Glucose-Capillary: 126 mg/dL — ABNORMAL HIGH (ref 70–99)
Glucose-Capillary: 128 mg/dL — ABNORMAL HIGH (ref 70–99)
Glucose-Capillary: 169 mg/dL — ABNORMAL HIGH (ref 70–99)
Glucose-Capillary: 194 mg/dL — ABNORMAL HIGH (ref 70–99)
Glucose-Capillary: 212 mg/dL — ABNORMAL HIGH (ref 70–99)
Glucose-Capillary: 86 mg/dL (ref 70–99)
Glucose-Capillary: 96 mg/dL (ref 70–99)

## 2021-12-21 MED ORDER — AMLODIPINE BESYLATE 5 MG PO TABS
2.5000 mg | ORAL_TABLET | Freq: Every day | ORAL | Status: DC
  Administered 2021-12-21 – 2021-12-23 (×3): 2.5 mg via ORAL
  Filled 2021-12-21 (×3): qty 1

## 2021-12-21 MED ORDER — ALUM & MAG HYDROXIDE-SIMETH 200-200-20 MG/5ML PO SUSP: 15.0000 mL | Freq: Four times a day (QID) | ORAL | Status: DC | PRN

## 2021-12-21 NOTE — Progress Notes (Signed)
[name] at [place] NAME: Shannon Chen    MR#:  [id number]  DATE OF BIRTH:  03-Jul-1945  SUBJECTIVE:   C/o gas pains Eating ok  VITALS:  Blood pressure 137/72, pulse 85, temperature 97.6 F (36.4 C), temperature source Oral, resp. rate 18, height 5' 5.5" (1.664 m), weight 64.9 kg, SpO2 96 %.  PHYSICAL EXAMINATION:   GENERAL:  76 y.o.-year-old patient lying in the bed with no acute distress. Chronically ill LUNGS: Normal breath sounds bilaterally, no wheezing CARDIOVASCULAR: S1, S2 normal. No murmurs,   ABDOMEN: Soft, nontender, nondistended. Bowel sounds present.  EXTREMITIES: edema+ NEUROLOGIC: nonfocal  patient is awake   LABORATORY PANEL:  CBC Recent Labs  Lab 12/20/21 0604  WBC 16.7*  HGB 7.4*  HCT 22.2*  PLT 96*     Chemistries  Recent Labs  Lab 12/20/21 0604  NA 142  K 3.9  CL 112*  CO2 25  GLUCOSE 115*  BUN 32*  CREATININE 0.84  CALCIUM 7.8*  MG 2.3     Assessment and Plan  Shannon Chen is a 76 year old with history of CAD, HOCM, AV block status post PPM, HTN, prior CVA admitted for bilateral lower extremity weakness..  About 5 days PTA she started experiencing bilateral lower extremity weakness and difficulty walking.  In the ED she was found to have rhabdomyolysis.  CT of the head, cervical thoracic and lumbar spine did not show any acute pathology.   Neuro consulted, due to concern for myopathy as paraneoplastic syndromes, CT chest/abdomen/pelvis w/ contrast performed for malignancy screening which found incidental vascular renal findings as below.  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   Sudden onset about 5 days PTA.  Reported numbness and weakness but no pain.  CT head,  cervical, lumbar and thoracic spine are unremarkable.  Unable to get MRI due to pacemaker.   --Neurology consulted with Dr. Curly Shores.  CK elevated at 6906, folate, B12 low normal, TSH normal.  CRP wnl, ESR only mildly elevated at 37. --Vit D 15 (low), which may contribute to proximal myopathy. --Per Neurology Continue 65 mg daily prednisone for 4-6 weeks (until rheumatology follow-up), taper per outpatient provider and pending clinical course and workup  --cont protonix and Ca supplementation while on steroid -outpatient rheumatologist/neurologist to consider pneumocystis jiroveci prophylaxis if high dose steroids will be continued longer term -Pending: serum myoglobin--4189 myositis panel, , MMA --514 -cont Vitamin D repletion, 50,000 units weekly for 8 weeks --cont vit B12 supplement --Continue to hold statin --f/u muscle biopsy result to assess for PM.   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  This places her at a very high risk for lethal rupture primarily from the left common iliac artery aneurysm.  The patient is a candidate for endovascular repair.  --Stent graft placement with Dr. Levy Pupa on 11/1--complex procedure -- discussed with Dr. Lucky Cowboy okay to start physical therapy.  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 Delana Meyer, will need to wait 1-2 months after the abdominal repair to minimize the risk of paraplegia   9 mm  nodule in left kidney --interval increase in size.  Raises possibility for paraneoplastic syndrome as presented with PM, however, CT renal protocal w/wo contrast showed the nodule to be benign.  --Oncology consulted, with Dr. Tasia Catchings   Acute kidney injury superimposed on CKD ([place]) Baseline creatinine 0.9.  Admission  creatinine 2.08, slowly improving with IV fluids, resolved now.   Hypertension Hypotension post procedure - hold amlodipine and Toprol -- now off IV vasopressin. Blood pressure meds   Hypertrophic cardiomyopathy ([place]) Patient is euvolemic at this time.    Type 2 diabetes mellitus with hyperlipidemia [facility]) She is on [place] for heart failure.  Hemoglobin A1c 6.4 --SSI while on steroid   Chronic obstructive pulmonary disease ([name]) --cont daily bronchodilators     DVT prophylaxis: Lovenox SQ Code Status: Full code  Family Communication: none today Level of care: Med-Surg Dispo:   The patient is from: home Anticipated d/c is to: SNF rehab on Monday per TOC info      TOTAL TIME TAKING CARE OF THIS PATIENT: 35 minutes.  >50% time spent on counselling and coordination of care  Note: This dictation was prepared with Dragon dictation along with smaller phrase technology. Any transcriptional errors that result from this process are unintentional.  Fritzi Mandes M.D    [facility]   CC: Primary care physician; Remi Haggard, FNP

## 2021-12-21 NOTE — Plan of Care (Signed)
  Problem: Education: Goal: Knowledge of General Education information will improve Description: Including pain rating scale, medication(s)/side effects and non-pharmacologic comfort measures Outcome: Progressing   Problem: Health Behavior/Discharge Planning: Goal: Ability to manage health-related needs will improve Outcome: Progressing   Problem: Clinical Measurements: Goal: Ability to maintain clinical measurements within normal limits will improve Outcome: Progressing Goal: Will remain free from infection Outcome: Progressing Goal: Diagnostic test results will improve Outcome: Progressing Goal: Respiratory complications will improve Outcome: Progressing Goal: Cardiovascular complication will be avoided Outcome: Progressing   Problem: Activity: Goal: Risk for activity intolerance will decrease Outcome: Progressing   Problem: Nutrition: Goal: Adequate nutrition will be maintained Outcome: Progressing   Problem: Coping: Goal: Level of anxiety will decrease Outcome: Progressing   Problem: Elimination: Goal: Will not experience complications related to bowel motility Outcome: Progressing Goal: Will not experience complications related to urinary retention Outcome: Progressing   Problem: Pain Managment: Goal: General experience of comfort will improve Outcome: Progressing   Problem: Safety: Goal: Ability to remain free from injury will improve Outcome: Progressing   Problem: Skin Integrity: Goal: Risk for impaired skin integrity will decrease Outcome: Progressing   Problem: Education: Goal: Ability to describe self-care measures that may prevent or decrease complications (Diabetes Survival Skills Education) will improve Outcome: Progressing Goal: Individualized Educational Video(s) Outcome: Progressing   Problem: Coping: Goal: Ability to adjust to condition or change in health will improve Outcome: Progressing   Problem: Fluid Volume: Goal: Ability to  maintain a balanced intake and output will improve Outcome: Progressing   Problem: Health Behavior/Discharge Planning: Goal: Ability to identify and utilize available resources and services will improve Outcome: Progressing Goal: Ability to manage health-related needs will improve Outcome: Progressing   Problem: Metabolic: Goal: Ability to maintain appropriate glucose levels will improve Outcome: Progressing   Problem: Nutritional: Goal: Maintenance of adequate nutrition will improve Outcome: Progressing Goal: Progress toward achieving an optimal weight will improve Outcome: Progressing   Problem: Skin Integrity: Goal: Risk for impaired skin integrity will decrease Outcome: Progressing   Problem: Tissue Perfusion: Goal: Adequacy of tissue perfusion will improve Outcome: Progressing   Problem: Education: Goal: Knowledge of discharge needs will improve Outcome: Progressing   Problem: Clinical Measurements: Goal: Postoperative complications will be avoided or minimized Outcome: Progressing   Problem: Respiratory: Goal: Ability to achieve and maintain a regular respiratory rate will improve Outcome: Progressing   Problem: Skin Integrity: Goal: Demonstration of wound healing without infection will improve Outcome: Progressing   

## 2021-12-21 NOTE — TOC Progression Note (Signed)
Transition of Care [facility]) - Progression Note    Patient Details  Name: Shannon Chen MRN: [id number] Date of Birth: 01/07/1946  Transition of Care [facility]) CM/SW [place], LCSW Phone Number: 12/21/2021, 9:51 AM  Clinical Narrative:    [place], patient's Josem Kaufmann is approved for [facility] 11/6-11/8.  Called Sharyn Lull in Admissions at [facility] who stated they would not be able to take patient until Monday due to their Pharmacy's hours. Plan for DC to [facility] on Monday 11/6.         Expected Discharge Plan and Services                                                 Social Determinants of Health (SDOH) Interventions    Readmission Risk Interventions    12/10/2021    2:16 PM 12/15/2020    4:10 PM  Readmission Risk Prevention Plan  Transportation Screening Complete Complete  PCP or Specialist Appt within 5-7 Days  Complete  Home Care Screening  Complete  Medication Review (RN CM)  Complete  HRI or Home Care Consult Complete   Social Work Consult for [place] Planning/Counseling Complete   Palliative Care Screening Not Applicable   Medication Review Press photographer) Complete

## 2021-12-22 DIAGNOSIS — R531 Weakness: Secondary | ICD-10-CM | POA: Diagnosis not present

## 2021-12-22 LAB — GLUCOSE, CAPILLARY
Glucose-Capillary: 104 mg/dL — ABNORMAL HIGH (ref 70–99)
Glucose-Capillary: 136 mg/dL — ABNORMAL HIGH (ref 70–99)
Glucose-Capillary: 138 mg/dL — ABNORMAL HIGH (ref 70–99)
Glucose-Capillary: 162 mg/dL — ABNORMAL HIGH (ref 70–99)
Glucose-Capillary: 168 mg/dL — ABNORMAL HIGH (ref 70–99)
Glucose-Capillary: 85 mg/dL (ref 70–99)

## 2021-12-22 MED ORDER — METOPROLOL SUCCINATE ER 50 MG PO TB24
50.0000 mg | ORAL_TABLET | Freq: Every day | ORAL | Status: DC
  Administered 2021-12-22 – 2021-12-23 (×2): 50 mg via ORAL
  Filled 2021-12-22 (×2): qty 1

## 2021-12-22 MED ORDER — DAPAGLIFLOZIN PROPANEDIOL 10 MG PO TABS
10.0000 mg | ORAL_TABLET | Freq: Every day | ORAL | Status: DC
  Administered 2021-12-22 – 2021-12-23 (×2): 10 mg via ORAL
  Filled 2021-12-22 (×2): qty 1

## 2021-12-22 MED ORDER — BISACODYL 10 MG RE SUPP
10.0000 mg | Freq: Every day | RECTAL | Status: DC
  Administered 2021-12-22: 10 mg via RECTAL
  Filled 2021-12-22: qty 1

## 2021-12-22 MED ORDER — ROSUVASTATIN CALCIUM 10 MG PO TABS
40.0000 mg | ORAL_TABLET | Freq: Every day | ORAL | Status: DC
  Administered 2021-12-22 – 2021-12-23 (×2): 40 mg via ORAL
  Filled 2021-12-22 (×2): qty 4

## 2021-12-22 NOTE — Plan of Care (Signed)
  Problem: Education: Goal: Knowledge of General Education information will improve Description: Including pain rating scale, medication(s)/side effects and non-pharmacologic comfort measures Outcome: Progressing   Problem: Health Behavior/Discharge Planning: Goal: Ability to manage health-related needs will improve Outcome: Progressing   Problem: Clinical Measurements: Goal: Ability to maintain clinical measurements within normal limits will improve Outcome: Progressing Goal: Will remain free from infection Outcome: Progressing Goal: Diagnostic test results will improve Outcome: Progressing Goal: Respiratory complications will improve Outcome: Progressing Goal: Cardiovascular complication will be avoided Outcome: Progressing   Problem: Activity: Goal: Risk for activity intolerance will decrease Outcome: Progressing   Problem: Nutrition: Goal: Adequate nutrition will be maintained Outcome: Progressing   Problem: Coping: Goal: Level of anxiety will decrease Outcome: Progressing   Problem: Elimination: Goal: Will not experience complications related to bowel motility Outcome: Progressing Goal: Will not experience complications related to urinary retention Outcome: Progressing   Problem: Pain Managment: Goal: General experience of comfort will improve Outcome: Progressing   Problem: Safety: Goal: Ability to remain free from injury will improve Outcome: Progressing   Problem: Skin Integrity: Goal: Risk for impaired skin integrity will decrease Outcome: Progressing   Problem: Education: Goal: Ability to describe self-care measures that may prevent or decrease complications (Diabetes Survival Skills Education) will improve Outcome: Progressing Goal: Individualized Educational Video(s) Outcome: Progressing   Problem: Coping: Goal: Ability to adjust to condition or change in health will improve Outcome: Progressing   Problem: Fluid Volume: Goal: Ability to  maintain a balanced intake and output will improve Outcome: Progressing   Problem: Health Behavior/Discharge Planning: Goal: Ability to identify and utilize available resources and services will improve Outcome: Progressing Goal: Ability to manage health-related needs will improve Outcome: Progressing   Problem: Metabolic: Goal: Ability to maintain appropriate glucose levels will improve Outcome: Progressing   Problem: Nutritional: Goal: Maintenance of adequate nutrition will improve Outcome: Progressing Goal: Progress toward achieving an optimal weight will improve Outcome: Progressing   Problem: Skin Integrity: Goal: Risk for impaired skin integrity will decrease Outcome: Progressing   Problem: Tissue Perfusion: Goal: Adequacy of tissue perfusion will improve Outcome: Progressing   Problem: Education: Goal: Knowledge of discharge needs will improve Outcome: Progressing   Problem: Clinical Measurements: Goal: Postoperative complications will be avoided or minimized Outcome: Progressing   Problem: Respiratory: Goal: Ability to achieve and maintain a regular respiratory rate will improve Outcome: Progressing   Problem: Skin Integrity: Goal: Demonstration of wound healing without infection will improve Outcome: Progressing   

## 2021-12-22 NOTE — Progress Notes (Signed)
[place] [facility] [place] NAME: Shannon Chen    MR#:  [id number]  DATE OF BIRTH:  April 27, 1945  SUBJECTIVE:   C/o gas pains constipated  VITALS:  Blood pressure (!) 145/53, pulse 86, temperature 98.5 F (36.9 C), temperature source Oral, resp. rate 17, height 5' 5.5" (1.664 m), weight 64.9 kg, SpO2 93 %.  PHYSICAL EXAMINATION:   GENERAL:  76 y.o.-year-old patient lying in the bed with no acute distress. Chronically ill LUNGS: Normal breath sounds bilaterally, no wheezing CARDIOVASCULAR: S1, S2 normal. No murmurs,   ABDOMEN: Soft, nontender, nondistended. Bowel sounds present.  EXTREMITIES: edema+ NEUROLOGIC: nonfocal  patient is awake   LABORATORY PANEL:  CBC Recent Labs  Lab 12/20/21 0604  WBC 16.7*  HGB 7.4*  HCT 22.2*  PLT 96*     Chemistries  Recent Labs  Lab 12/20/21 0604  NA 142  K 3.9  CL 112*  CO2 25  GLUCOSE 115*  BUN 32*  CREATININE 0.84  CALCIUM 7.8*  MG 2.3     Assessment and Plan  Shannon Chen is a 76 year old with history of CAD, HOCM, AV block status post PPM, HTN, prior CVA admitted for bilateral lower extremity weakness..  About 5 days PTA she started experiencing bilateral lower extremity weakness and difficulty walking.  In the ED she was found to have rhabdomyolysis.  CT of the head, cervical thoracic and lumbar spine did not show any acute pathology.   Neuro consulted, due to concern for myopathy as paraneoplastic syndromes, CT chest/abdomen/pelvis w/ contrast performed for malignancy screening which found incidental vascular renal findings as below.  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   Sudden onset about 5 days PTA.  Reported numbness and weakness but no pain.  CT head,  cervical, lumbar and thoracic spine are unremarkable.  Unable to get MRI due to pacemaker.   --Neurology consulted with Dr. Curly Shores.  CK elevated at 6906, folate, B12 low normal, TSH normal.  CRP wnl, ESR only mildly elevated at 37. --Vit D 15 (low), which may contribute to proximal myopathy. --Per Neurology Continue 65 mg daily prednisone for 4-6 weeks (until rheumatology follow-up), taper per outpatient provider and pending clinical course and workup  --cont protonix and Ca supplementation while on steroid -outpatient rheumatologist/neurologist to consider pneumocystis jiroveci prophylaxis if high dose steroids will be continued longer term -Pending: serum myoglobin--4189 myositis panel, , MMA --514 -cont Vitamin D repletion, 50,000 units weekly for 8 weeks --cont vit B12 supplement --Continue to hold statin --f/u muscle biopsy result to assess for PM.   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  This places her at a very high risk for lethal rupture primarily from the left common iliac artery aneurysm.  The patient is a candidate for endovascular repair.  --Stent graft placement with Dr. Levy Pupa on 11/1--complex procedure -- discussed with Dr. Lucky Cowboy okay to start physical therapy.  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 Delana Meyer, will need to wait 1-2 months after the abdominal repair to minimize the risk of paraplegia   9 mm  nodule in left kidney --interval increase in size.  Raises possibility for paraneoplastic syndrome as presented with PM, however, CT renal protocal w/wo contrast showed the nodule to be benign.  --Oncology consulted, with Dr. Tasia Catchings   Acute kidney injury superimposed on CKD ([place]) Baseline creatinine 0.9.  Admission  creatinine 2.08, slowly improving with IV fluids, resolved now.   Hypertension Hypotension post procedure - hold amlodipine and Toprol -- now off IV vasopressin. Blood pressure meds   Hypertrophic cardiomyopathy ([place]) Patient is euvolemic at this time.    Type 2 diabetes mellitus with hyperlipidemia [facility]) She is on [place] for heart failure.  Hemoglobin A1c 6.4 --SSI while on steroid   Chronic obstructive pulmonary disease ([name]) --cont daily bronchodilators     DVT prophylaxis: Lovenox SQ Code Status: Full code  Family Communication: none today Level of care: Med-Surg Dispo:   The patient is from: home Anticipated d/c is to: SNF rehab on Monday per TOC info      TOTAL TIME TAKING CARE OF THIS PATIENT: 35 minutes.  >50% time spent on counselling and coordination of care  Note: This dictation was prepared with Dragon dictation along with smaller phrase technology. Any transcriptional errors that result from this process are unintentional.  Fritzi Mandes M.D    [facility]   CC: Primary care physician; Remi Haggard, FNP

## 2021-12-23 DIAGNOSIS — R531 Weakness: Secondary | ICD-10-CM | POA: Diagnosis not present

## 2021-12-23 LAB — CULTURE, BLOOD (ROUTINE X 2)
Culture: NO GROWTH
Culture: NO GROWTH

## 2021-12-23 LAB — MISC LABCORP TEST (SEND OUT): Labcorp test code: 520057

## 2021-12-23 LAB — GLUCOSE, CAPILLARY
Glucose-Capillary: 138 mg/dL — ABNORMAL HIGH (ref 70–99)
Glucose-Capillary: 139 mg/dL — ABNORMAL HIGH (ref 70–99)
Glucose-Capillary: 93 mg/dL (ref 70–99)
Glucose-Capillary: 95 mg/dL (ref 70–99)

## 2021-12-23 MED ORDER — ALUM & MAG HYDROXIDE-SIMETH 200-200-20 MG/5ML PO SUSP: 15.0000 mL | Freq: Four times a day (QID) | ORAL | 0 refills | Status: DC | PRN

## 2021-12-23 MED ORDER — CALCIUM CARBONATE 1250 (500 CA) MG PO TABS: 1250.0000 mg | ORAL_TABLET | Freq: Two times a day (BID) | ORAL | 2 refills | Status: DC

## 2021-12-23 MED ORDER — ADULT MULTIVITAMIN W/MINERALS CH: 1.0000 | ORAL_TABLET | Freq: Every day | ORAL | 2 refills | Status: DC

## 2021-12-23 MED ORDER — ENSURE ENLIVE PO LIQD: 237.0000 mL | Freq: Three times a day (TID) | ORAL | 12 refills | Status: DC

## 2021-12-23 MED ORDER — FOLIC ACID 1 MG PO TABS: 1.0000 mg | ORAL_TABLET | Freq: Every day | ORAL | 2 refills | Status: DC

## 2021-12-23 MED ORDER — VITAMIN D (ERGOCALCIFEROL) 1.25 MG (50000 UNIT) PO CAPS: 50000.0000 [IU] | ORAL_CAPSULE | ORAL | 0 refills | Status: DC

## 2021-12-23 MED ORDER — CYANOCOBALAMIN 1000 MCG PO TABS: 1000.0000 ug | ORAL_TABLET | Freq: Every day | ORAL | 2 refills | Status: DC

## 2021-12-23 MED ORDER — PREDNISONE 20 MG PO TABS: 60.0000 mg | ORAL_TABLET | Freq: Every day | ORAL | 0 refills | Status: DC

## 2021-12-23 MED ORDER — OXYCODONE-ACETAMINOPHEN 5-325 MG PO TABS: 1.0000 | ORAL_TABLET | Freq: Four times a day (QID) | ORAL | 0 refills | Status: DC | PRN

## 2021-12-23 MED ORDER — PANTOPRAZOLE SODIUM 40 MG PO TBEC: 40.0000 mg | DELAYED_RELEASE_TABLET | Freq: Every day | ORAL | 2 refills | Status: DC

## 2021-12-23 NOTE — Progress Notes (Signed)
Occupational Therapy Treatment Patient Details Name: Shannon Chen MRN: [id number] DOB: 03/11/1945 Today's Date: 12/23/2021   History of present illness Patient is a 76 year old female with medical history significant of CAD, HOCM, AV block s/p PPM, hypertension, hyperlipidemia, prior CVA who presents to the ED with complaints of bilateral lower extremity weakness and transient numbness in legs. 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   OT comments  Pt seen for OT tx. Pt agreeable, endorsing improved pain today. Pt instructed in ADL transfers with RW mgt and VC for hand placement, requiring MIN A from recliner and CGA from EOB. Pt tolerated standing with UE support on RW while provided with wipes for standing pericare with no LOB. Pt in recliner at end of session. Pt continues to benefit from skilled OT services. Progressing towards goals. SNF remains appropriate.    Recommendations for follow up therapy are one component of a multi-disciplinary discharge planning process, led by the attending physician.  Recommendations may be updated based on patient status, additional functional criteria and insurance authorization.    Follow Up Recommendations  Skilled nursing-short term rehab (<3 hours/day)    Assistance Recommended at Discharge Frequent or constant Supervision/Assistance  Patient can return home with the following  Direct supervision/assist for medications management;Help with stairs or ramp for entrance;Assistance with cooking/housework;A lot of help with walking and/or transfers;A lot of help with bathing/dressing/bathroom;Assist for transportation   Equipment Recommendations  Other (comment) (defer to next venue)    Recommendations for Other Services      Precautions / Restrictions Precautions Precautions: Fall Restrictions Weight Bearing Restrictions: No Other Position/Activity Restrictions: Per Dr. Allena Katz and Dr. Wyn Quaker  ok to mobilize patient without restriction 12/20/21       Mobility Bed Mobility               General bed mobility comments: NT, in recliner    Transfers Overall transfer level: Needs assistance Equipment used: Rolling walker (2 wheels) Transfers: Sit to/from Stand, Bed to chair/wheelchair/BSC Sit to Stand: Min guard, Min assist     Step pivot transfers: Min guard     General transfer comment: MIN A for initial STS with VC for hand placement and RW mgt, improving to CGA for 2nd transfer from EOB     Balance Overall balance assessment: Needs assistance Sitting-balance support: Feet supported Sitting balance-Leahy Scale: Fair     Standing balance support: Bilateral upper extremity supported, During functional activity, Reliant on assistive device for balance, Single extremity supported Standing balance-Leahy Scale: Fair                             ADL either performed or assessed with clinical judgement   ADL Overall ADL's : Needs assistance/impaired                         Toilet Transfer: Rolling walker (2 wheels);Min guard Toilet Transfer Details (indicate cue type and reason): step pivot Toileting- Architect and Hygiene: Min guard;Sit to/from stand;Set up Toileting - Clothing Manipulation Details (indicate cue type and reason): set up in standing pt completed pericare            Extremity/Trunk Assessment              Vision       Perception     Praxis      Cognition  Arousal/Alertness: Awake/alert Behavior During Therapy: WFL for tasks assessed/performed Overall Cognitive Status: No family/caregiver present to determine baseline cognitive functioning                                          Exercises      Shoulder Instructions       General Comments      Pertinent Vitals/ Pain       Pain Assessment Pain Assessment: 0-10 Pain Score: 4  Pain Location: BLE Pain Descriptors /  Indicators: Sore Pain Intervention(s): Limited activity within patient's tolerance, Monitored during session, Repositioned, Premedicated before session  Home Living                                          Prior Functioning/Environment              Frequency  Min 2X/week        Progress Toward Goals  OT Goals(current goals can now be found in the care plan section)  Progress towards OT goals: Progressing toward goals  Acute Rehab OT Goals Patient Stated Goal: go to rehab to get better OT Goal Formulation: With patient Time For Goal Achievement: 01/02/22 Potential to Achieve Goals: Good  Plan Discharge plan remains appropriate;Frequency remains appropriate    Co-evaluation                 AM-PAC OT "6 Clicks" Daily Activity     Outcome Measure   Help from another person eating meals?: None Help from another person taking care of personal grooming?: A Little Help from another person toileting, which includes using toliet, bedpan, or urinal?: A Little Help from another person bathing (including washing, rinsing, drying)?: A Lot Help from another person to put on and taking off regular upper body clothing?: A Lot Help from another person to put on and taking off regular lower body clothing?: A Lot 6 Click Score: 16    End of Session Equipment Utilized During Treatment: Gait belt;Rolling walker (2 wheels)  OT Visit Diagnosis: Unsteadiness on feet (R26.81);Muscle weakness (generalized) (M62.81);Pain Pain - Right/Left: Left Pain - part of body: Leg   Activity Tolerance Patient tolerated treatment well   Patient Left in chair;with call bell/phone within reach;with chair alarm set   Nurse Communication          Time: 3154-0086 OT Time Calculation (min): 11 min  Charges: OT General Charges $OT Visit: 1 Visit OT Treatments $Self Care/Home Management : 8-22 mins  Ardeth Perfect., MPH, MS, OTR/L ascom [phone] 12/23/21, 12:22 PM

## 2021-12-23 NOTE — Care Management Important Message (Signed)
Important Message  Patient Details  Name: Shannon Chen MRN: [id number] Date of Birth: 1946-02-16   Medicare Important Message Given:  Yes     [name] Janish 12/23/2021, 11:12 AM

## 2021-12-23 NOTE — Plan of Care (Signed)
  Problem: Clinical Measurements: Goal: Ability to maintain clinical measurements within normal limits will improve Outcome: Progressing   Problem: Nutrition: Goal: Adequate nutrition will be maintained Outcome: Progressing   Problem: Activity: Goal: Risk for activity intolerance will decrease Outcome: Progressing   Problem: Safety: Goal: Ability to remain free from injury will improve Outcome: Progressing   Problem: Skin Integrity: Goal: Risk for impaired skin integrity will decrease Outcome: Progressing   Problem: Respiratory: Goal: Ability to achieve and maintain a regular respiratory rate will improve Outcome: Progressing   Problem: Pain Managment: Goal: General experience of comfort will improve Outcome: Progressing

## 2021-12-23 NOTE — Discharge Summary (Addendum)
Physician Discharge Summary   Patient: Shannon Chen MRN: [id number] DOB: 12-21-1945  Admit date:     12/09/2021  Discharge date: 12/23/21  Discharge Physician: Fritzi Mandes   PCP: Remi Haggard, FNP   Recommendations at discharge:    F/u Dr. Lucky Cowboy vascular surgery in 1 to 2 week patient will follow-up with Dr. Narda Amber neurology in [place] in 2 to 3 weeks patient to follow-up/establish with rheumatology Dr. Ephriam Jenkins in 1 to 2 weeks. Follow-up Dr. Tasia Catchings for renal mass follow-up PCP in 1 to 2 weeks  Discharge Diagnoses: Principal Problem:   Generalized weakness Active Problems:   Acute kidney injury superimposed on CKD ([name])   Hypertension   Hypertrophic cardiomyopathy ([place])   Type 2 diabetes mellitus with hyperlipidemia ([name])   Chronic obstructive pulmonary disease ([name])   Weakness of both lower extremities   Myopathy   Kidney mass   Coronary artery disease of native artery of native heart with stable angina pectoris ([name])   PAD (peripheral artery disease) [facility])  Hospital Course: ASLEE SUCH is a 76 year old with history of CAD, HOCM, AV block status post PPM, HTN, prior CVA admitted for bilateral lower extremity weakness..  About 5 days PTA she started experiencing bilateral lower extremity weakness and difficulty walking.  In the ED she was found to have rhabdomyolysis.  CT of the head, cervical thoracic and lumbar spine did not show any acute pathology.   Neuro consulted, due to concern for myopathy as paraneoplastic syndromes, CT chest/abdomen/pelvis w/ contrast performed for malignancy screening which found incidental vascular renal findings as below.   Post operative hypotension/shock/acute blood loss during procedure Anemia of chronic disease --hgb 9.0--3 units PRBC during vascular procedure--hgb 8.9--7.4   Bilateral lower extremity weakness 2/2 myopathy Rhabdomyolysis.   Sudden onset about 5 days PTA.  Reported numbness and weakness but no pain.  CT  head, cervical, lumbar and thoracic spine are unremarkable.  Unable to get MRI due to pacemaker.   --Neurology consulted with Dr. Curly Shores.  CK elevated at 6906, folate, B12 low normal, TSH normal.  CRP wnl, ESR only mildly elevated at 37. --Vit D 15 (low), which may contribute to proximal myopathy. --Per Neurology Continue 65 mg daily prednisone for 4-6 weeks (until rheumatology follow-up), taper per outpatient provider and pending clinical course and workup. Ambulatory referral sent to Dr.Mayur Posey Pronto --cont protonix and Ca supplementation while on steroid -outpatient rheumatologist/neurologist to consider pneumocystis jiroveci prophylaxis if high dose steroids will be continued longer term -- serum myoglobin--4189 myositis panel, , MMA --514 -cont Vitamin D repletion, 50,000 units weekly for 8 weeks --cont vit B12 supplement --Continue to hold statin --f/u muscle biopsy result to assess for ?PM--not resulted at discharge   Common iliac artery aneurysm  --Per vascular surgery, CT a/p showed an 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  This places her at a very high risk for lethal rupture primarily from the left common iliac artery aneurysm.  The patient is a candidate for endovascular repair.  --Stent graft placement with Dr. Levy Pupa on 11/1--complex procedure -- discussed with Dr. Lucky Cowboy okay to start physical therapy. -- Continue aspirin Abex. Patient will follow-up with Dr. Lucky Cowboy as outpatient.   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 Delana Meyer, will need to wait 1-2 months after the abdominal repair to minimize the risk  of paraplegia   9 mm nodule in left kidney --interval increase in size.  Raises possibility for paraneoplastic syndrome as presented with PM, however, CT renal protocal w/wo contrast showed  the nodule to be benign.  --Oncology consulted, with Dr. Tasia Catchings-- follow-up as outpatient   Acute kidney injury superimposed on CKD ([place]) Baseline creatinine 0.9.   Admission creatinine 2.08, slowly improving with IV fluids, resolved now.   Hypertension --resumed BP meds  Hypertrophic cardiomyopathy ([place]) Patient is euvolemic at this time.     Type 2 diabetes mellitus with hyperlipidemia ([name]) S Hemoglobin A1c 6.4 --resumed home meds   Chronic obstructive pulmonary disease ([name]) --cont daily bronchodilators     DVT prophylaxis: Lovenox SQ Code Status: Full code  Family Communication: none today Level of care: Med-Surg Dispo:   The patient is from: home Anticipated d/c is to: SNF rehab today  Pt is best at baseline for discharge. Pt agreeable      Pain control - [place] Controlled Substance Reporting System database was reviewed. and patient was instructed, not to drive, operate heavy machinery, perform activities at heights, swimming or participation in water activities or provide baby-sitting services while on Pain, Sleep and Anxiety Medications; until their outpatient Physician has advised to do so again. Also recommended to not to take more than prescribed Pain, Sleep and Anxiety Medications.  Consultants: vascular, oncology, neurology Procedures performed: muscle biopsy, lower extremity vascular complex procedure Disposition: Nursing home Diet recommendation:  Discharge Diet Orders (From admission, onward)     Start     Ordered   12/23/21 0000  Diet - low sodium heart healthy        12/23/21 1042           Cardiac and Carb modified diet DISCHARGE MEDICATION: Allergies as of 12/23/2021   No Known Allergies      Medication List     STOP taking these medications    butalbital-acetaminophen-caffeine 50-325-40 MG tablet Commonly known as: FIORICET   prochlorperazine 10 MG tablet Commonly known as: COMPAZINE       TAKE these medications     acetaminophen 325 MG tablet Commonly known as: TYLENOL Take 650 mg by mouth every 4 (four) hours as needed for mild pain or moderate pain.   albuterol 108 (90 Base) MCG/ACT inhaler Commonly known as: VENTOLIN HFA Inhale 1-2 puffs into the lungs every 6 (six) hours as needed for wheezing or shortness of breath.   alum & mag hydroxide-simeth 200-200-20 MG/5ML suspension Commonly known as: MAALOX/MYLANTA Take 15 mLs by mouth every 6 (six) hours as needed for flatulence.   amLODipine 2.5 MG tablet Commonly known as: NORVASC Take 2.5 mg by mouth daily.   aspirin EC 81 MG tablet Take 1 tablet (81 mg total) by mouth daily. Swallow whole.   calcium carbonate 1250 (500 Ca) MG tablet Commonly known as: OS-CAL - dosed in mg of elemental calcium Take 1 tablet (1,250 mg total) by mouth 2 (two) times daily with a meal.   clopidogrel 75 MG tablet Commonly known as: PLAVIX Take 75 mg by mouth daily.   cyanocobalamin 1000 MCG tablet Take 1 tablet (1,000 mcg total) by mouth daily. Start taking on: December 24, 2021   dapagliflozin propanediol 10 MG Tabs tablet Commonly known as: FARXIGA Take 10 mg by mouth daily.   docusate sodium 100 MG capsule Commonly known as: Colace Take 1 capsule (100 mg total) by mouth 2 (two) times daily.   feeding supplement Liqd Take 237 mLs  by mouth 3 (three) times daily between meals.   folic acid 1 MG tablet Commonly known as: FOLVITE Take 1 tablet (1 mg total) by mouth daily. Start taking on: December 24, 2021   Gemtesa 75 MG Tabs Generic drug: Vibegron Take 75 mg by mouth daily.   Kerendia 20 MG Tabs Generic drug: Finerenone Take 20 mg by mouth daily.   loperamide 2 MG capsule Commonly known as: IMODIUM Take 2 mg by mouth as needed.   meclizine 12.5 MG tablet Commonly known as: ANTIVERT Take 0.5 mg by mouth as needed for dizziness.   metoprolol succinate 50 MG 24 hr tablet Commonly known as: TOPROL-XL Take 1 tablet (50 mg total) by  mouth daily.   mirtazapine 7.5 MG tablet Commonly known as: REMERON Take 7.5 mg by mouth at bedtime.   multivitamin with minerals Tabs tablet Take 1 tablet by mouth daily. Start taking on: December 24, 2021   oxyCODONE-acetaminophen 5-325 MG tablet Commonly known as: PERCOCET/ROXICET Take 1 tablet by mouth every 6 (six) hours as needed for moderate pain.   pantoprazole 40 MG tablet Commonly known as: PROTONIX Take 1 tablet (40 mg total) by mouth daily. Start taking on: December 24, 2021   polyethylene glycol 17 g packet Commonly known as: MiraLax Take 17 g by mouth daily as needed for mild constipation.   potassium chloride 10 MEQ tablet Commonly known as: KLOR-CON Take 10 mEq by mouth daily.   predniSONE 20 MG tablet Commonly known as: DELTASONE Take 3 tablets (60 mg total) by mouth daily with breakfast. Take it till you are seen by Rheumatology Start taking on: December 24, 2021   rosuvastatin 40 MG tablet Commonly known as: CRESTOR TAKE 1 TABLET BY MOUTH DAILY   Trelegy Ellipta 200-62.5-25 MCG/ACT Aepb Generic drug: Fluticasone-Umeclidin-Vilant Inhale 1 puff into the lungs daily.   Vitamin D (Ergocalciferol) 1.25 MG (50000 UNIT) Caps capsule Commonly known as: DRISDOL Take 1 capsule (50,000 Units total) by mouth every 7 (seven) days.        Contact information for follow-up providers     Remi Haggard, FNP. Schedule an appointment as soon as possible for a visit in 1 week(s).   Specialty: Family Medicine Contact information: [place] [address] [phone]         [place] .   Specialty: Emergency Medicine Why: If symptoms worsen Contact information: [place] [id number] [address] [place]        Earlie Server, MD. Schedule an appointment as soon as possible for a visit in 1 week(s).   Specialty: Oncology Why: renal mass f/u Contact  information: [place] [address] [phone]         Quintin Alto, MD. Schedule an appointment as soon as possible for a visit in 2 week(s).   Specialty: Rheumatology Why: for f/u muscle biospy for ?? Polymyositis Contact information: [place] [address] [phone]         Remi Haggard, FNP .   Specialty: Family Medicine Contact information: [place] [address] [phone]         Algernon Huxley, MD. Schedule an appointment as soon as possible for a visit in 1 week(s).   Specialties: Vascular Surgery, Radiology, Interventional Cardiology Why: Wound check with [facility] Contact information: [place] [address] [phone]         Alda Berthold, DO.  Schedule an appointment as soon as possible for a visit in 2 week(s).   Specialty: Neurology Why: hospital f/u Contact information: [place] [address] [phone]              Contact information for after-discharge care     Destination     [facility] .   Service: Skilled Nursing Contact information: [address] [place] [phone]                    Discharge Exam: Danley Danker Weights   12/09/21 7408  Weight: 64.9 kg     Condition at discharge: fair  The results of significant diagnostics from this hospitalization (including imaging, microbiology, ancillary and laboratory) are listed below for reference.   Imaging Studies: PERIPHERAL VASCULAR CATHETERIZATION  Result Date: 12/18/2021 See surgical note for result.  CT RENAL ABD W/WO  Result Date: 12/15/2021 CLINICAL DATA:  Characterize indeterminate left renal lesion EXAM: CT ABDOMEN WITHOUT AND WITH CONTRAST TECHNIQUE: Multidetector CT imaging of the abdomen was performed following the standard protocol before and following the bolus administration of  intravenous contrast. RADIATION DOSE REDUCTION: This exam was performed according to the departmental dose-optimization program which includes automated exposure control, adjustment of the mA and/or kV according to patient size and/or use of iterative reconstruction technique. CONTRAST:  180m OMNIPAQUE IOHEXOL 300 MG/ML  SOLN COMPARISON:  CT chest abdomen pelvis, 12/12/2021 FINDINGS: Lower chest: No acute abnormality. Aortic valve calcifications. Extensive three-vessel coronary artery calcifications. Hepatobiliary: No solid liver abnormality is seen. No gallstones. Unchanged biliary ductal dilatation, the common bile duct measuring up to 1.5 cm, however tapering to the ampulla. Pancreas: Unremarkable. No pancreatic ductal dilatation or surrounding inflammatory changes. Spleen: Normal in size without significant abnormality. Adrenals/Urinary Tract: Adrenal glands are unremarkable. Redemonstrated 0.9 x 0.9 cm partially exophytic lesion of the lateral midportion of the left kidney, which is of high precontrast attenuation measuring 77 HU, and without evidence of postcontrast enhancement, 81 HU (series 3, image 23). Kidneys are otherwise normal, without renal calculi, solid lesion, or hydronephrosis. Stomach/Bowel: Stomach is within normal limits. No evidence of bowel wall thickening, distention, or inflammatory changes. Large burden of stool throughout the included colon. Vascular/Lymphatic: Severe aortic atherosclerosis. Unchanged, penetrating ulcerations of the right and left aspects of the infrarenal abdominal aorta (series 3, image 28, 38). Other: No abdominal wall hernia or abnormality. No ascites. Musculoskeletal: No acute or significant osseous findings. IMPRESSION: 1. Redemonstrated 0.9 x 0.9 cm partially exophytic lesion of the lateral midportion of the left kidney, consistent with a benign hemorrhagic or proteinaceous cyst. No further follow-up or characterization is required. 2. Unchanged biliary ductal  dilatation, the common bile duct measuring up to 1.5 cm, however tapering to the ampulla without evident obstructing calculus or other lesion. 3. Severe aortic atherosclerosis. Unchanged, penetrating ulcerations of the right and left aspects of the infrarenal abdominal aorta. 4. Coronary artery disease. 5. Aortic valve calcifications. Correlate for echocardiographic evidence of aortic valve dysfunction. Aortic Atherosclerosis (ICD10-I70.0). Electronically Signed   By: ADelanna AhmadiM.D.   On: 12/15/2021 16:56   CT CHEST ABDOMEN PELVIS W CONTRAST  Result Date: 12/12/2021 CLINICAL DATA:  Occult malignancy 142218 Myopathy 142218 EXAM: CT CHEST, ABDOMEN, AND PELVIS WITH CONTRAST TECHNIQUE: Multidetector CT imaging of the chest, abdomen and pelvis was performed following the standard protocol during bolus administration of intravenous contrast. RADIATION DOSE REDUCTION: This exam was performed according to the departmental dose-optimization  program which includes automated exposure control, adjustment of the mA and/or kV according to patient size and/or use of iterative reconstruction technique. CONTRAST:  135m OMNIPAQUE IOHEXOL 300 MG/ML  SOLN COMPARISON:  12/28/2020 FINDINGS: CT CHEST FINDINGS Cardiovascular: Extensive multi-vessel coronary artery calcification. Calcification of the aortic valve leaflets noted. Global cardiac size within normal limits. Left subclavian dual lead pacemaker in place with leads within the right atrium and right ventricle. No pericardial effusion. Central pulmonary arteries are of normal caliber. Extensive atherosclerotic calcification within the thoracic aorta. No aortic aneurysm. Interval development of a penetrating atherosclerotic ulcer within the distal descending thoracic aorta best seen on axial image # 38/2 and coronal image # 89/5 measuring 9 mm in diameter and 5 mm in depth. No periaortic hematoma seen. Mediastinum/Nodes: Visualized thyroid is unremarkable. No pathologic  thoracic adenopathy. Esophagus unremarkable. Lungs/Pleura: Mild left basilar dependent atelectasis. Lungs are otherwise clear. No pneumothorax or pleural effusion. Central airways are widely patent. Musculoskeletal: No chest wall mass or suspicious bone lesions identified. CT ABDOMEN PELVIS FINDINGS Hepatobiliary: The gallbladder is unremarkable. There is mild intrahepatic and marked extrahepatic biliary ductal dilation with the extrahepatic bile duct measuring up to 16 mm in diameter, stable since prior examination. A distal obstructing process, such as a biliary stricture or ampullary stenosis, is not excluded. No enhancing intrahepatic mass identified. Pancreas: Unremarkable Spleen: Unremarkable Adrenals/Urinary Tract: The adrenal glands are unremarkable. The kidneys are normal in size and position. 9 mm hypoenhancing cortical nodule is seen within the interpolar region of the left kidney which demonstrates interval increase in size since prior examination where this measured approximately 7 mm and is suspicious for a solid renal neoplasm. No hydronephrosis. No intrarenal or ureteral calculi. The bladder is unremarkable. Stomach/Bowel: Moderate colonic stool burden without evidence of obstruction. Mild sigmoid diverticulosis without superimposed acute inflammatory change. Stomach, small bowel, and large bowel are otherwise unremarkable. Appendix normal. No free intraperitoneal gas or fluid. Vascular/Lymphatic: Extensive aortoiliac atherosclerotic calcification. Suspected hemodynamically significant stenoses involving the celiac axis and superior mesenteric artery origins as well as the renal ostia bilaterally, not well characterized on this examination. Broad-based penetrating atherosclerotic ulcer involving the left lateral infrarenal abdominal aorta appears stable in size measuring 18 x 21 mm with a depth of approximately 6 mm but demonstrates a small amount of increasing mural thrombus, best seen on axial  image # 71/2 and coronal image # 61/5. 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 This is best measured on the current examination on coronal image # 69/5 and axial image # 86/2. Aneurysm demonstrating a limited focal dissection involving the right common iliac artery is unchanged measuring 2.0 cm in diameter. Fusiform aneurysm of the left common iliac artery is unchanged measuring 2.4 cm in diameter. No pathologic adenopathy within the abdomen and pelvis. Reproductive: Status post hysterectomy. No adnexal masses. Other: Subcutaneous gas within the left lower quadrant abdominal wall likely relates to subcutaneous injection. No abdominal wall hernia. No abdominopelvic ascites. Musculoskeletal: Degenerative changes are seen within the lumbar spine. No acute bone abnormality. No lytic or blastic bone lesion. IMPRESSION: 1. Interval development of a penetrating atherosclerotic ulcer within the distal descending thoracic aorta measuring 9 mm in diameter and 5 mm in depth. No periaortic hematoma seen. 2. Stable size of a broad-based penetrating atherosclerotic ulcer involving the left lateral infrarenal abdominal aorta measuring 21 mm in diameter and 7 mm in depth.  3. 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 4. 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 5. Extensive multi-vessel coronary artery calcification. 6. 9 mm hypoenhancing cortical nodule within the interpolar region of the left kidney demonstrating interval increase in size since prior examination and suspicious for a solid renal neoplasm. This could be further assessed with dedicated renal mass protocol CT or MRI examination if clinically indicated. 7. Suspected hemodynamically  significant stenoses involving the celiac axis and superior mesenteric artery origins as well as the renal ostia bilaterally, not well characterized on this examination. Clinical correlation for signs and symptoms of hemodynamically significant renal artery stenosis or chronic mesenteric ischemia is recommended. If present, this would be better assessed with CT arteriography. 8. Stable marked extrahepatic biliary ductal dilation. A distal obstructing process, such as a biliary stricture or ampullary stenosis, is not excluded. Correlation with liver enzymes would be helpful for further evaluation. If abnormal, this could be further assessed with ERCP or MRCP examination. Aortic Atherosclerosis (ICD10-I70.0). Electronically Signed   By: Fidela Salisbury M.D.   On: 12/12/2021 14:23   CT CERVICAL SPINE WO CONTRAST  Result Date: 12/09/2021 CLINICAL DATA:  Myelopathy, acute, cervical spine; Myelopathy, acute, lumbar spine; Myelopathy, acute, thoracic spine EXAM: CT CERVICAL, THORACIC, AND LUMBAR SPINE WITHOUT CONTRAST TECHNIQUE: Multidetector CT imaging of the cervical, thoracic and lumbar spine was performed without intravenous contrast. Multiplanar CT image reconstructions were also generated. RADIATION DOSE REDUCTION: This exam was performed according to the departmental dose-optimization program which includes automated exposure control, adjustment of the mA and/or kV according to patient size and/or use of iterative reconstruction technique. COMPARISON:  CT abdomen pelvis 11/17/2017, CT abdomen pelvis 03/14/2020 FINDINGS: CT CERVICAL SPINE FINDINGS Alignment: Normal. Skull base and vertebrae: Multilevel mild-to-moderate degenerative changes spine most prominent at the C5 through C7 levels. Associated intervertebral disc space narrowing at these levels. No severe osseous central canal or neural foraminal stenosis. No acute fracture. No aggressive appearing focal osseous lesion or focal pathologic process. Soft  tissues and spinal canal: No prevertebral fluid or swelling. No visible canal hematoma. Upper chest: Centrilobular emphysematous changes. Other: 1.2 cm hypodense lesion within the right thyroid gland. Not clinically significant; no follow-up imaging recommended (ref: J Am Coll Radiol. 2015 Feb;12(2): 143-50).Atherosclerotic plaque of the carotid arteries within the neck. CT THORACIC SPINE FINDINGS Alignment: Normal. Vertebrae: No severe osseous neural foraminal or central canal stenosis. No acute fracture or focal pathologic process. Paraspinal and other soft tissues: Negative. Disc levels: Developing intervertebral disc space vacuum phenomenon at a couple levels. Otherwise maintained. CT LUMBAR SPINE FINDINGS Segmentation: 5 lumbar type vertebrae. Alignment: Normal. Vertebrae: Mild multilevel degenerative changes spine. Diffusely decreased bone density. No severe osseous central canal or neural foraminal stenosis. No acute fracture or focal pathologic process. Paraspinal and other soft tissues: Negative. Disc levels: Intervertebral disc space vacuum phenomenon at the L5-S1 level. 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 Redemonstration of several focal areas of distension of the abdominal aorta and with no aneurysm. Severe atherosclerotic plaque. Four-vessel coronary calcification. Partially visualized cardiac leads. IMPRESSION: 1. No acute displaced fracture or traumatic listhesis of the cervical, thoracic, lumbar spine. 2. No severe osseous neural foraminal or central canal  stenosis of the cervical, thoracic, lumbar spine. 3. 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 4. Interval increase in size of a aneurysmal left common iliac  artery measuring up to 2.1 cm. 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 5. Aortic Atherosclerosis (ICD10-I70.0) - severe including four-vessel coronary calcification. 6.  Emphysema (ICD10-J43.9). Electronically Signed   By: Iven Finn M.D.   On: 12/09/2021 21:51   CT THORACIC SPINE WO CONTRAST  Result Date: 12/09/2021 CLINICAL DATA:  Myelopathy, acute, cervical spine; Myelopathy, acute, lumbar spine; Myelopathy, acute, thoracic spine EXAM: CT CERVICAL, THORACIC, AND LUMBAR SPINE WITHOUT CONTRAST TECHNIQUE: Multidetector CT imaging of the cervical, thoracic and lumbar spine was performed without intravenous contrast. Multiplanar CT image reconstructions were also generated. RADIATION DOSE REDUCTION: This exam was performed according to the departmental dose-optimization program which includes automated exposure control, adjustment of the mA and/or kV according to patient size and/or use of iterative reconstruction technique. COMPARISON:  CT abdomen pelvis 11/17/2017, CT abdomen pelvis 03/14/2020 FINDINGS: CT CERVICAL SPINE FINDINGS Alignment: Normal. Skull base and vertebrae: Multilevel mild-to-moderate degenerative changes spine most prominent at the C5 through C7 levels. Associated intervertebral disc space narrowing at these levels. No severe osseous central canal or neural foraminal stenosis. No acute fracture. No aggressive appearing focal osseous lesion or focal pathologic process. Soft tissues and spinal canal: No prevertebral fluid or swelling. No visible canal hematoma. Upper chest: Centrilobular emphysematous changes. Other: 1.2 cm hypodense lesion within the right thyroid gland. Not clinically significant; no follow-up imaging recommended (ref: J Am Coll Radiol. 2015 Feb;12(2): 143-50).Atherosclerotic plaque of the carotid arteries within the neck. CT THORACIC SPINE FINDINGS Alignment: Normal. Vertebrae: No severe osseous neural foraminal or central canal stenosis. No  acute fracture or focal pathologic process. Paraspinal and other soft tissues: Negative. Disc levels: Developing intervertebral disc space vacuum phenomenon at a couple levels. Otherwise maintained. CT LUMBAR SPINE FINDINGS Segmentation: 5 lumbar type vertebrae. Alignment: Normal. Vertebrae: Mild multilevel degenerative changes spine. Diffusely decreased bone density. No severe osseous central canal or neural foraminal stenosis. No acute fracture or focal pathologic process. Paraspinal and other soft tissues: Negative. Disc levels: Intervertebral disc space vacuum phenomenon at the L5-S1 level. 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 Redemonstration of several focal areas of distension of the abdominal aorta and with no aneurysm. Severe atherosclerotic plaque. Four-vessel coronary calcification. Partially visualized cardiac leads. IMPRESSION: 1. No acute displaced fracture or traumatic listhesis of the cervical, thoracic, lumbar spine. 2. No severe osseous neural foraminal or central canal stenosis of the cervical, thoracic, lumbar spine. 3. 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 4. 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 5. Aortic Atherosclerosis (ICD10-I70.0) - severe including four-vessel coronary calcification. 6.  Emphysema (ICD10-J43.9). Electronically Signed   By: Iven Finn M.D.   On: 12/09/2021 21:51   CT LUMBAR SPINE WO CONTRAST  Result Date: 12/09/2021 CLINICAL DATA:  Myelopathy, acute, cervical spine; Myelopathy, acute, lumbar spine; Myelopathy, acute, thoracic spine EXAM: CT CERVICAL,  THORACIC, AND LUMBAR SPINE WITHOUT CONTRAST TECHNIQUE: Multidetector CT imaging of the  cervical, thoracic and lumbar spine was performed without intravenous contrast. Multiplanar CT image reconstructions were also generated. RADIATION DOSE REDUCTION: This exam was performed according to the departmental dose-optimization program which includes automated exposure control, adjustment of the mA and/or kV according to patient size and/or use of iterative reconstruction technique. COMPARISON:  CT abdomen pelvis 11/17/2017, CT abdomen pelvis 03/14/2020 FINDINGS: CT CERVICAL SPINE FINDINGS Alignment: Normal. Skull base and vertebrae: Multilevel mild-to-moderate degenerative changes spine most prominent at the C5 through C7 levels. Associated intervertebral disc space narrowing at these levels. No severe osseous central canal or neural foraminal stenosis. No acute fracture. No aggressive appearing focal osseous lesion or focal pathologic process. Soft tissues and spinal canal: No prevertebral fluid or swelling. No visible canal hematoma. Upper chest: Centrilobular emphysematous changes. Other: 1.2 cm hypodense lesion within the right thyroid gland. Not clinically significant; no follow-up imaging recommended (ref: J Am Coll Radiol. 2015 Feb;12(2): 143-50).Atherosclerotic plaque of the carotid arteries within the neck. CT THORACIC SPINE FINDINGS Alignment: Normal. Vertebrae: No severe osseous neural foraminal or central canal stenosis. No acute fracture or focal pathologic process. Paraspinal and other soft tissues: Negative. Disc levels: Developing intervertebral disc space vacuum phenomenon at a couple levels. Otherwise maintained. CT LUMBAR SPINE FINDINGS Segmentation: 5 lumbar type vertebrae. Alignment: Normal. Vertebrae: Mild multilevel degenerative changes spine. Diffusely decreased bone density. No severe osseous central canal or neural foraminal stenosis. No acute fracture or focal pathologic process. Paraspinal and  other soft tissues: Negative. Disc levels: Intervertebral disc space vacuum phenomenon at the L5-S1 level. 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 Redemonstration of several focal areas of distension of the abdominal aorta and with no aneurysm. Severe atherosclerotic plaque. Four-vessel coronary calcification. Partially visualized cardiac leads. IMPRESSION: 1. No acute displaced fracture or traumatic listhesis of the cervical, thoracic, lumbar spine. 2. No severe osseous neural foraminal or central canal stenosis of the cervical, thoracic, lumbar spine. 3. 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 4. 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 5. Aortic Atherosclerosis (ICD10-I70.0) - severe including four-vessel coronary calcification. 6.  Emphysema (ICD10-J43.9). Electronically Signed   By: Iven Finn M.D.   On: 12/09/2021 21:51   CT HEAD WO CONTRAST (5MM)  Result Date: 12/09/2021 CLINICAL DATA:  Hypertension, diabetes mellitus, atrial fibrillation, generalized weakness EXAM: CT HEAD WITHOUT CONTRAST TECHNIQUE: Contiguous axial images were obtained from the base of the skull through the vertex without intravenous contrast. RADIATION DOSE REDUCTION: This exam was performed according to the departmental dose-optimization program which includes automated exposure control, adjustment of the mA and/or kV according to patient size and/or use of iterative reconstruction technique. COMPARISON:  01/06/2021 FINDINGS: Brain: Generalized atrophy. Normal ventricular morphology. No midline shift or mass effect. Small  vessel chronic ischemic changes of deep cerebral white matter. BILATERAL old basal ganglia lacunar infarcts. Small old infarct RIGHT cerebellum. No intracranial hemorrhage, mass lesion, evidence of acute infarction, or extra-axial fluid collection. Vascular: Atherosclerotic calcifications of internal carotid arteries at skull base. No hyperdense vessels. Skull: Intact Sinuses/Orbits: Partial opacification of BILATERAL ethmoid air cells. Remaining paranasal sinuses and mastoid air cells clear Other: Significant  ossification within falx. IMPRESSION: Atrophy with small vessel chronic ischemic changes of deep cerebral white matter. 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 No acute intracranial abnormalities. Electronically Signed   By: Lavonia Dana M.D.   On: 12/09/2021 18:31   DG Chest Portable 1 View  Result Date: 12/09/2021 CLINICAL DATA:  Short of breath, hypertension, generalized weakness EXAM: PORTABLE CHEST 1 VIEW COMPARISON:  12/28/2020 FINDINGS: Single frontal view of the chest demonstrates a stable cardiac silhouette. Dual lead pacemaker again noted. Stable atherosclerosis of the thoracic aorta. No acute airspace disease, effusion, or pneumothorax. No acute bony abnormality. IMPRESSION: 1. Stable chest, no acute process. Electronically Signed   By: Randa Ngo M.D.   On: 12/09/2021 18:14    Microbiology: Results for orders placed or performed during the hospital encounter of 12/09/21  SARS Coronavirus 2 by RT PCR (hospital order, performed in [facility] hospital lab) *cepheid single result test* Anterior Nasal Swab     Status: None   Collection Time: 12/09/21  6:14 PM   Specimen: Anterior Nasal Swab  Result Value Ref Range Status   SARS Coronavirus 2 by RT PCR NEGATIVE NEGATIVE Final    Comment: (NOTE) SARS-CoV-2 target nucleic acids are NOT DETECTED.  The SARS-CoV-2 RNA is generally detectable in upper and lower respiratory specimens during the acute phase of infection.  The lowest concentration of SARS-CoV-2 viral copies this assay can detect is 250 copies / mL. A negative result does not preclude SARS-CoV-2 infection and should not be used as the sole basis for treatment or other patient management decisions.  A negative result may occur with improper specimen collection / handling, submission of specimen other than nasopharyngeal swab, presence of viral mutation(s) within the areas targeted by this assay, and inadequate number of viral copies (<250 copies / mL). A negative result must be combined with clinical observations, patient history, and epidemiological information.  Fact Sheet for Patients:   https://www..info/  Fact Sheet for Healthcare Providers: https://hall.com/  This test is not yet approved or  cleared by the [place] FDA and has been authorized for detection and/or diagnosis of SARS-CoV-2 by FDA under an Emergency Use Authorization (EUA).  This EUA will remain in effect (meaning this test can be used) for the duration of the COVID-19 declaration under Section 564(b)(1) of the Act, 21 U.S.C. section 360bbb-3(b)(1), unless the authorization is terminated or revoked sooner.  Performed at [facility], [address] [place], [place] [address]   Culture, blood (Routine X 2) w Reflex to ID Panel     Status: None (Preliminary result)   Collection Time: 12/18/21  6:20 PM   Specimen: BLOOD RIGHT HAND  Result Value Ref Range Status   Specimen Description BLOOD RIGHT HAND  Final   Special Requests   Final    BOTTLES DRAWN AEROBIC ONLY Blood Culture results may not be optimal due to an inadequate volume of blood received in culture bottles   Culture   Final    NO GROWTH 4 DAYS Performed at [facility], [address] [place], [place] [address]    Report Status PENDING  Incomplete  Culture, blood (Routine X 2) w Reflex to ID Panel     Status: None (Preliminary  result)   Collection Time: 12/18/21  6:32 PM   Specimen: BLOOD LEFT HAND  Result Value Ref Range Status   Specimen Description BLOOD LEFT HAND  Final   Special Requests BOTTLES DRAWN AEROBIC ONLY BCLV  Final  Culture   Final    NO GROWTH 4 DAYS Performed at [facility], [place], [place] [address]    Report Status PENDING  Incomplete    Labs: CBC: Recent Labs  Lab 12/17/21 0434 12/18/21 0616 12/18/21 1349 12/18/21 2048 12/19/21 0449 12/19/21 1104 12/19/21 1751 12/20/21 0011 12/20/21 0604  WBC 13.2* 10.6* 16.4*  --  18.2*  --   --   --  16.7*  HGB 9.4* 9.0* 8.9*   < > 7.8* 6.9* 8.7* 8.0* 7.4*  HCT 29.9* 29.1* 28.4*   < > 23.8* 20.8* 25.7* 23.0* 22.2*  MCV 84.7 87.9 93.7  --  89.5  --   --   --  86.4  PLT 199 201 121*  --  138*  --   --   --  96*   < > = values in this interval not displayed.   Basic Metabolic Panel: Recent Labs  Lab 12/17/21 0434 12/18/21 0616 12/18/21 1349 12/18/21 2048 12/19/21 0449 12/20/21 0604  NA 139 138 138 142 141 142  K 4.1 4.1 4.9 4.6 4.1 3.9  CL 106 108 114* 112* 110 112*  CO2 26 25 16* _0 GLUCOSE 130* 89 278* 168* 131* 115*  BUN 56* 52* 42* 44* 45* 32*  CREATININE 1.25* 1.18* 1.11* 0.99 1.20* 0.84  CALCIUM 8.4* 8.5* 6.6* 6.9* 6.9* 7.8*  MG 2.0 2.1 1.8  --  1.7 2.3  PHOS  --   --   --   --  5.6* 3.5   Liver Function Tests: No results for input(s): "AST", "ALT", "ALKPHOS", "BILITOT", "PROT", "ALBUMIN" in the last 168 hours. CBG: Recent Labs  Lab 12/22/21 1651 12/22/21 2036 12/23/21 0010 12/23/21 0445 12/23/21 0803  GLUCAP 162* 168* 138* 93 95    Discharge time spent: greater than 30 minutes.  Signed: Fritzi Mandes, MD [facility] 12/23/2021

## 2021-12-23 NOTE — Progress Notes (Signed)
Contacted [employer] and reported to nurse Weldon Picking.

## 2021-12-23 NOTE — TOC Progression Note (Addendum)
Transition of Care [facility]) - Progression Note    Patient Details  Name: Shannon Chen MRN: [id number] Date of Birth: 25-Jan-1946  [facility]) CM/SW [place], RN Phone Number: 12/23/2021, 11:45 AM  Clinical Narrative:    Alexander Mt the first contact in the chart at [phone] and left a general VM for a call back  [place] the patient's son at [phone] to notify of the room number 70P at [facility],  EMS called to transport the patient,  She is 4th on list        Expected Discharge Plan and Services           Expected Discharge Date: 12/23/21                                     Social Determinants of Health ([place]) Interventions    Readmission Risk Interventions    12/10/2021    2:16 PM 12/15/2020    4:10 PM  Readmission Risk Prevention Plan  Transportation Screening Complete Complete  PCP or Specialist Appt within 5-7 Days  Complete  Home Care Screening  Complete  Medication Review (RN CM)  Complete  HRI or Home Care Consult Complete   Social Work Consult for [place] Planning/Counseling Complete   Palliative Care Screening Not Applicable   Medication Review [employer] photographer) Complete

## 2021-12-23 NOTE — Progress Notes (Addendum)
EMS transported pt from [facility] to [facility].  ?

## 2021-12-24 LAB — MISC LABCORP TEST (SEND OUT)
LabCorp test name: 520085
Labcorp test code: 520085

## 2021-12-25 ENCOUNTER — Encounter: Payer: Self-pay | Admitting: Neurology

## 2021-12-27 ENCOUNTER — Other Ambulatory Visit (INDEPENDENT_AMBULATORY_CARE_PROVIDER_SITE_OTHER): Payer: Self-pay | Admitting: Vascular Surgery

## 2021-12-27 ENCOUNTER — Telehealth (INDEPENDENT_AMBULATORY_CARE_PROVIDER_SITE_OTHER): Payer: Self-pay | Admitting: Vascular Surgery

## 2021-12-27 DIAGNOSIS — I7143 Infrarenal abdominal aortic aneurysm, without rupture: Secondary | ICD-10-CM

## 2021-12-27 NOTE — Telephone Encounter (Signed)
Spoke with Shannon Chen's place to try and make a follow up appt for pt. Receptionist stated that pt is now in rehab and wanted appt for December. I advised that appt was wanted by Dr. Gilda Crease for 1 week post procedure with a fasting [place].   I was able to call [facility] and schedule appt for pt. Made appt with nurse/front desk at [facility]. She is aware of the "split" appt.

## 2021-12-30 ENCOUNTER — Inpatient Hospital Stay: Payer: Medicare HMO | Attending: Oncology | Admitting: Oncology

## 2022-01-01 ENCOUNTER — Ambulatory Visit (INDEPENDENT_AMBULATORY_CARE_PROVIDER_SITE_OTHER): Payer: Medicare HMO

## 2022-01-01 DIAGNOSIS — I7143 Infrarenal abdominal aortic aneurysm, without rupture: Secondary | ICD-10-CM

## 2022-01-06 ENCOUNTER — Ambulatory Visit (INDEPENDENT_AMBULATORY_CARE_PROVIDER_SITE_OTHER): Payer: Medicare HMO | Admitting: Nurse Practitioner

## 2022-01-06 ENCOUNTER — Encounter (INDEPENDENT_AMBULATORY_CARE_PROVIDER_SITE_OTHER): Payer: Self-pay | Admitting: Nurse Practitioner

## 2022-01-06 VITALS — BP 137/76 | HR 69 | Resp 17

## 2022-01-06 DIAGNOSIS — I10 Essential (primary) hypertension: Secondary | ICD-10-CM

## 2022-01-06 DIAGNOSIS — I7143 Infrarenal abdominal aortic aneurysm, without rupture: Secondary | ICD-10-CM

## 2022-01-06 DIAGNOSIS — I7123 Aneurysm of the descending thoracic aorta, without rupture: Secondary | ICD-10-CM

## 2022-01-12 ENCOUNTER — Encounter (INDEPENDENT_AMBULATORY_CARE_PROVIDER_SITE_OTHER): Payer: Self-pay | Admitting: Nurse Practitioner

## 2022-01-12 NOTE — Progress Notes (Signed)
Subjective:    Patient ID: Shannon Chen, female    DOB: 1945-05-25, 76 y.o.   MRN: [id number] No chief complaint on file.    Patient is a 76 year old woman known to our service status post carotid endarterectomy years ago.  She presented to [facility] with fairly abrupt onset of weakness of the lower extremities and loss of sensation.  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  Diffuse atherosclerotic changes were also mentioned.   Patient denies unusual abdominal pain or unusual back pain, no other abdominal complaints.  No history of an abrupt onset of a painful toe associated with blue discoloration.      No family history of AAA.    Patient denies amaurosis fugax or TIA symptoms. There is no history of claudication or rest pain symptoms of the lower extremities.  The patient denies angina or shortness of breath.   CT scan is reviewed by me and I concur with the left common iliac artery aneurysm is now 3 cm to 3.2 cm in diameter.  The right common iliac artery aneurysm is 2.7 cm.  The abdominal aorta is 25 mm in diameter.  Of significance when I compared the CT scan to previous scans dated January 2022 and November 2022 there has been rapid enlargement of the left common iliac artery aneurysm.  On the previous scans it was noted to be 2.0 cm and 2.4 cm respectively.  The patient underwent EVAR of the abdominal aortic aneurysm on 12/18/2021, showed patent graft with no evidence of endoleak.   She continues to have significant weakness.       Review of Systems  Neurological:  Positive for weakness.  All other systems reviewed and are  negative.      Objective:   Physical Exam Vitals reviewed.  HENT:     Head: Normocephalic.  Cardiovascular:     Rate and Rhythm: Normal rate.     Pulses: Normal pulses.  Pulmonary:     Effort: Pulmonary effort is normal.  Skin:    General: Skin is warm and dry.  Neurological:     Mental Status: She is alert and oriented to person, place, and time.  Psychiatric:        Mood and Affect: Mood normal.        Behavior: Behavior normal.        Thought Content: Thought content normal.        Judgment: Judgment normal.     BP 137/76 (BP Location: Right Arm)   Pulse 69   Resp 17   Past Medical History:  Diagnosis Date   Arthritis    AV block, Mobitz II    a. 09/2016 s/p MDT [id number] Azure XT DR MRI DC PPM   CAD (coronary artery disease)    a. 11/2020 NSTEMI/Cath: LM mild diff dzs, LAD, diff dzs, LCX 30d, RCA Ca2+ w/ diff dzs throughout, 25m. FFR cath not able to advance-->Med Rx.   Carotid artery disease ([place])    a. 09/2016 Carotid U/S: RICA [id number], LICA [id number];  b. [id number] CTA Neck: RICA 60, RCCA 30, LICA [id number], L Vert 60, L Basilar 70; c. 10/2019 s/p R CEA.   Essential  hypertension    History of stress test    a. 12/2016 MV: EF 51%, no ischemia/infarct; b. 10/2019 MV: EF 61%, no ischemia/infarct.   Hyperlipidemia    Hypertrophic cardiomyopathy ([place])    a. 09/2016 Echo: EF 60-65%, no rwma, Gr1 DD, Ca2+ MV annulus; b. 11/2017 Echo: EF 70-75%, no rwma, Gr1 DD, mild AS vs dynamic LVOT obs. Nl RV fxn. Hyperdynamic LVEF w/ sev LVH; c. 10/2019 Echo: EF 70-75%, no rwma, gr1 DD, sev LVH, Gr1 DD, nl PASP, triv MR, mild-mod Ao sclerosis; c. 05/2020 Echo: EF 60-65%; e. 11/2020 Echo: EF 60-65%, sev LVH, nl RV fxn, Mild MR. Ao Sclerosis.   Presence of permanent cardiac pacemaker     Social History   Socioeconomic History   Marital status: Single    Spouse name: Not on file   Number of children: Not on file   Years of education: Not on file   Highest education level: Not on file  Occupational  History   Not on file  Tobacco Use   Smoking status: Former    Packs/day: 1.00    Years: 30.00    Total pack years: 30.00    Types: Cigarettes    Quit date: 11/09/2018    Years since quitting: 3.1   Smokeless tobacco: Never  Vaping Use   Vaping Use: Never used  Substance and Sexual Activity   Alcohol use: No   Drug use: No   Sexual activity: Not on file  Other Topics Concern   Not on file  Social History Narrative   ** Merged History Encounter **       Independent at baseline Ambulates without any assistance. Lives by herself   Social Determinants of Health   Financial Resource Strain: Not on file  Food Insecurity: No Food Insecurity (12/10/2021)   Hunger Vital Sign    Worried About Running Out of Food in the Last Year: Never true    Ran Out of Food in the Last Year: Never true  Transportation Needs: No Transportation Needs (12/10/2021)   PRAPARE - Hydrologist (Medical): No    Lack of Transportation (Non-Medical): No  Physical Activity: Not on file  Stress: Not on file  Social Connections: Not on file  Intimate Partner Violence: Not At Risk (12/10/2021)   Humiliation, Afraid, Rape, and Kick questionnaire    Fear of Current or Ex-Partner: No    Emotionally Abused: No    Physically Abused: No    Sexually Abused: No    Past Surgical History:  Procedure Laterality Date   ABDOMINAL HYSTERECTOMY     CORONARY STENT INTERVENTION N/A 12/31/2020   Procedure: CORONARY STENT INTERVENTION;  Surgeon: Wellington Hampshire, MD;  Location: [place] [facility];  Service: Cardiovascular;  Laterality: N/A;   ENDARTERECTOMY Right 11/16/2019   Procedure: ENDARTERECTOMY CAROTID;  Surgeon: Katha Cabal, MD;  Location: [facility];  Service: Vascular;  Laterality: Right;   ENDOVASCULAR REPAIR/STENT GRAFT N/A 12/18/2021   Procedure: ENDOVASCULAR REPAIR/STENT GRAFT;  Surgeon: Katha Cabal, MD;  Location: [facility];  Service: Cardiovascular;   Laterality: N/A;   INTRAVASCULAR PRESSURE WIRE/FFR STUDY N/A 12/13/2020   Procedure: INTRAVASCULAR PRESSURE WIRE/FFR STUDY;  Surgeon: Leonie Man, MD;  Location: [facility];  Service: Cardiovascular;  Laterality: N/A;   LEFT HEART CATH AND CORONARY ANGIOGRAPHY N/A 12/13/2020   Procedure: LEFT HEART CATH AND CORONARY ANGIOGRAPHY;  Surgeon: Leonie Man, MD;  Location: [place] [facility];  Service: Cardiovascular;  Laterality: N/A;   LEFT HEART CATH AND CORONARY ANGIOGRAPHY N/A 12/31/2020   Procedure: LEFT HEART CATH AND CORONARY ANGIOGRAPHY;  Surgeon: Iran Ouch, MD;  Location: [facility];  Service: Cardiovascular;  Laterality: N/A;   MUSCLE BIOPSY Left 12/16/2021   Procedure: MUSCLE BIOPSY;  Surgeon: Campbell Lerner, MD;  Location: [facility];  Service: General;  Laterality: Left;   PACEMAKER IMPLANT N/A 10/08/2016   Procedure: Pacemaker Implant;  Surgeon: Marinus Maw, MD;  Location: [facility];  Service: Cardiovascular;  Laterality: N/A;    Family History  Problem Relation Age of Onset   Diabetes Mother    CAD Father     No Known Allergies     Latest Ref Rng & Units 12/20/2021    6:04 AM 12/20/2021   12:11 AM 12/19/2021    5:51 PM  CBC  WBC 4.0 - 10.5 K/uL 16.7     Hemoglobin 12.0 - 15.0 g/dL 7.4  8.0  8.7   Hematocrit 36.0 - 46.0 % 22.2  23.0  25.7   Platelets 150 - 400 K/uL 96         CMP     Component Value Date/Time   NA 142 12/20/2021 0604   K 3.9 12/20/2021 0604   CL 112 (H) 12/20/2021 0604   CO2 25 12/20/2021 0604   GLUCOSE 115 (H) 12/20/2021 0604   BUN 32 (H) 12/20/2021 0604   CREATININE 0.84 12/20/2021 0604   CALCIUM 7.8 (L) 12/20/2021 0604   PROT 6.5 12/13/2021 0436   ALBUMIN 2.9 (L) 12/13/2021 0436   AST 56 (H) 12/13/2021 0436   ALT 95 (H) 12/13/2021 0436   ALKPHOS 79 12/13/2021 0436   BILITOT 0.8 12/13/2021 0436   GFRNONAA >60 12/20/2021 0604   GFRAA >60 11/18/2019 0619     No results found.      Assessment & Plan:   1. Infrarenal abdominal aortic aneurysm (AAA) without rupture ([name]) Recommend:  Patient is status post successful endovascular repair of the AAA.   No further intervention is required at this time.   No endoleak is detected and the aneurysm sac is stable.  The patient will continue antiplatelet therapy as prescribed as well as aggressive management of hyperlipidemia. Exercise is again strongly encouraged.   However, endografts require continued surveillance with ultrasound or CT scan. This is mandatory to detect any changes that allow repressurization of the aneurysm sac.  The patient is informed that this would be asymptomatic.  The patient is reminded that lifelong routine surveillance is a necessity with an endograft. Patient will continue to follow-up at the specified interval with ultrasound of the aorta.  2. 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  Typically we prefer at least 6 to 8 weeks prior to intervention.  We will have the patient return in 4 weeks to discuss moving forward with possible intervention.  3. Primary hypertension Continue antihypertensive medications as already ordered, these medications have been reviewed and there are no changes at this time.   Current Outpatient Medications on File Prior to Visit  Medication Sig Dispense Refill   acetaminophen (TYLENOL) 325 MG tablet Take 650 mg by mouth every 4 (four) hours as needed for mild pain or moderate pain.     albuterol (VENTOLIN HFA)  108 (90 Base) MCG/ACT inhaler Inhale 1-2 puffs into the lungs every 6 (six) hours as needed for wheezing or shortness of breath.     alum & mag hydroxide-simeth (MAALOX/MYLANTA) 200-200-20 MG/5ML suspension Take 15 mLs by mouth every 6 (six) hours as  needed for flatulence. 355 mL 0   amLODipine (NORVASC) 2.5 MG tablet Take 2.5 mg by mouth daily.     aspirin EC 81 MG EC tablet Take 1 tablet (81 mg total) by mouth daily. Swallow whole. 30 tablet 11   calcium carbonate (OS-CAL - DOSED IN MG OF ELEMENTAL CALCIUM) 1250 (500 Ca) MG tablet Take 1 tablet (1,250 mg total) by mouth 2 (two) times daily with a meal. 60 tablet 2   clopidogrel (PLAVIX) 75 MG tablet Take 75 mg by mouth daily.     cyanocobalamin 1000 MCG tablet Take 1 tablet (1,000 mcg total) by mouth daily. 30 tablet 2   dapagliflozin propanediol (FARXIGA) 10 MG TABS tablet Take 10 mg by mouth daily.     docusate sodium (COLACE) 100 MG capsule Take 100 mg by mouth 2 (two) times daily.     feeding supplement (ENSURE ENLIVE / ENSURE PLUS) LIQD Take 237 mLs by mouth 3 (three) times daily between meals. 123XX123 mL 12   folic acid (FOLVITE) 1 MG tablet Take 1 tablet (1 mg total) by mouth daily. 30 tablet 2   KERENDIA 20 MG TABS Take 20 mg by mouth daily.     loperamide (IMODIUM) 2 MG capsule Take 2 mg by mouth as needed.     meclizine (ANTIVERT) 12.5 MG tablet Take 0.5 mg by mouth as needed for dizziness.     mirtazapine (REMERON) 7.5 MG tablet Take 7.5 mg by mouth at bedtime.     Multiple Vitamin (MULTIVITAMIN WITH MINERALS) TABS tablet Take 1 tablet by mouth daily. 30 tablet 2   oxyCODONE-acetaminophen (PERCOCET/ROXICET) 5-325 MG tablet Take 1 tablet by mouth every 6 (six) hours as needed for moderate pain. 30 tablet 0   pantoprazole (PROTONIX) 40 MG tablet Take 1 tablet (40 mg total) by mouth daily. 30 tablet 2   polyethylene glycol (MIRALAX) 17 g packet Take 17 g by mouth daily as needed for mild constipation. 14 each 2   potassium chloride (KLOR-CON) 10 MEQ tablet Take 10 mEq by mouth daily.     predniSONE (DELTASONE) 20 MG tablet Take 3 tablets (60 mg total) by mouth daily with breakfast. Take it till you are seen by Rheumatology 60 tablet 0   rosuvastatin (CRESTOR) 40 MG tablet TAKE 1 TABLET  BY MOUTH DAILY 90 tablet 0   TRELEGY ELLIPTA 200-62.5-25 MCG/ACT AEPB Inhale 1 puff into the lungs daily.     Vibegron (GEMTESA) 75 MG TABS Take 75 mg by mouth daily.     Vitamin D, Ergocalciferol, (DRISDOL) 1.25 MG (50000 UNIT) CAPS capsule Take 1 capsule (50,000 Units total) by mouth every 7 (seven) days. 5 capsule 0   metoprolol succinate (TOPROL-XL) 50 MG 24 hr tablet Take 1 tablet (50 mg total) by mouth daily. 30 tablet 2   No current facility-administered medications on file prior to visit.    There are no Patient Instructions on file for this visit. No follow-ups on file.   Kris Hartmann, NP

## 2022-01-13 ENCOUNTER — Telehealth (INDEPENDENT_AMBULATORY_CARE_PROVIDER_SITE_OTHER): Payer: Self-pay

## 2022-01-13 NOTE — Telephone Encounter (Signed)
Which wound? The patient had several procedures and we need to be sure she is seeing the right provider

## 2022-01-13 NOTE — Telephone Encounter (Signed)
Bloody drainage very serosanguineous drainage from wound gets worse with activity especially therapy been applying Calcium alginate dressing slows down drainage but then it keeps draining over and over.  Mardella Layman NP at [facility] thinks she may need to come into office to be seen. Please advise

## 2022-01-14 NOTE — Telephone Encounter (Signed)
Tried calling [place] NP with no answer and no VM

## 2022-01-15 ENCOUNTER — Encounter: Payer: Self-pay | Admitting: Neurology

## 2022-01-15 LAB — SURGICAL PATHOLOGY

## 2022-01-16 ENCOUNTER — Ambulatory Visit (INDEPENDENT_AMBULATORY_CARE_PROVIDER_SITE_OTHER): Payer: Medicare HMO | Admitting: Surgery

## 2022-01-16 ENCOUNTER — Other Ambulatory Visit: Payer: Self-pay

## 2022-01-16 ENCOUNTER — Encounter: Payer: Self-pay | Admitting: Surgery

## 2022-01-16 VITALS — BP 144/81 | HR 73 | Temp 98.1°F | Ht 65.5 in | Wt 141.0 lb

## 2022-01-16 DIAGNOSIS — L7632 Postprocedural hematoma of skin and subcutaneous tissue following other procedure: Secondary | ICD-10-CM | POA: Diagnosis not present
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
## 2022-01-16 NOTE — Patient Instructions (Signed)
We will need the old packing to be removed. New packing to be placed into the wound. Cover the wound with gauze and secure with tape. This will need to be daily and until the wound is healed. Continue Antibiotic.  Please see return appointment listed below.

## 2022-01-21 ENCOUNTER — Encounter (INDEPENDENT_AMBULATORY_CARE_PROVIDER_SITE_OTHER): Payer: Self-pay | Admitting: Nurse Practitioner

## 2022-01-21 ENCOUNTER — Ambulatory Visit (INDEPENDENT_AMBULATORY_CARE_PROVIDER_SITE_OTHER): Payer: Medicare HMO | Admitting: Nurse Practitioner

## 2022-01-21 VITALS — BP 142/71 | HR 79 | Resp 17 | Ht 65.0 in | Wt 139.0 lb

## 2022-01-21 DIAGNOSIS — I719 Aortic aneurysm of unspecified site, without rupture: Secondary | ICD-10-CM

## 2022-01-21 DIAGNOSIS — E785 Hyperlipidemia, unspecified: Secondary | ICD-10-CM

## 2022-01-21 DIAGNOSIS — I10 Essential (primary) hypertension: Secondary | ICD-10-CM

## 2022-01-30 ENCOUNTER — Encounter: Payer: Self-pay | Admitting: Podiatry

## 2022-01-30 ENCOUNTER — Ambulatory Visit (INDEPENDENT_AMBULATORY_CARE_PROVIDER_SITE_OTHER): Payer: Medicare HMO | Admitting: Podiatry

## 2022-01-30 DIAGNOSIS — E1142 Type 2 diabetes mellitus with diabetic polyneuropathy: Secondary | ICD-10-CM | POA: Diagnosis not present

## 2022-01-30 DIAGNOSIS — B351 Tinea unguium: Secondary | ICD-10-CM | POA: Diagnosis not present

## 2022-01-30 DIAGNOSIS — I739 Peripheral vascular disease, unspecified: Secondary | ICD-10-CM | POA: Diagnosis not present

## 2022-01-30 DIAGNOSIS — M79675 Pain in left toe(s): Secondary | ICD-10-CM

## 2022-01-30 DIAGNOSIS — M79674 Pain in right toe(s): Secondary | ICD-10-CM

## 2022-01-30 DIAGNOSIS — N179 Acute kidney failure, unspecified: Secondary | ICD-10-CM | POA: Diagnosis not present

## 2022-01-30 NOTE — Progress Notes (Signed)
This patient returns to my office for at risk foot care.  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  This patient is unable to cut nails herself since the patient cannot reach her nails.These nails are painful walking and wearing shoes.  This patient presents for at risk foot care today.  General Appearance  Alert, conversant and in no acute stress.  Vascular  Dorsalis pedis and posterior tibial  pulses are absent  bilaterally.  Capillary return is within normal limits  bilaterally. Cold feet.    Bilaterally. Absent digital hair  B/L.  Neurologic  Senn-Weinstein monofilament wire test absent  bilaterally. .  Nails Thick disfigured discolored nails with subungual debris  from hallux to fifth toes bilaterally. No evidence of bacterial infection or drainage bilaterally.  Orthopedic  No limitations of motion  feet .  No crepitus or effusions noted.  No bony pathology or digital deformities noted.  Skin  normotropic skin with no porokeratosis noted bilaterally.  No signs of infections or ulcers noted.     Onychomycosis  Pain in right toes  Pain in left toes  Consent was obtained for treatment procedures.   Mechanical debridement of nails 1-5  bilaterally performed with a nail nipper.  Filed with dremel without incident.    Return office visit   3   months                   Told patient to return for periodic foot care and evaluation due to potential at risk complications.   Helane Gunther DPM

## 2022-02-02 ENCOUNTER — Encounter (INDEPENDENT_AMBULATORY_CARE_PROVIDER_SITE_OTHER): Payer: Self-pay | Admitting: Nurse Practitioner

## 2022-02-02 NOTE — Progress Notes (Signed)
Subjective:    Patient ID: Shannon JumperVinnie M Sharber, female    DOB: 04/12/1945, 76 y.o.   MRN: [id number] Chief Complaint  Patient presents with   Follow-up    4 week follow up      Patient is a 76 year old woman known to our service status post carotid endarterectomy years ago.  She presented to [facility] with fairly abrupt onset of weakness of the lower extremities and loss of sensation.  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  Diffuse atherosclerotic changes were also mentioned.   Patient denies unusual abdominal pain or unusual back pain, no other abdominal complaints.  No history of an abrupt onset of a painful toe associated with blue discoloration.      No family history of AAA.    Patient denies amaurosis fugax or TIA symptoms. There is no history of claudication or rest pain symptoms of the lower extremities.  The patient denies angina or shortness of breath.   CT scan is reviewed by me and I concur with the left common iliac artery aneurysm is now 3 cm to 3.2 cm in diameter.  The right common iliac artery aneurysm is 2.7 cm.  The abdominal aorta is 25 mm in diameter.  Of significance when I compared the CT scan to previous scans dated January 2022 and November 2022 there has been rapid enlargement of the left common iliac artery aneurysm.  On the previous scans it was noted to be 2.0 cm and 2.4 cm respectively.   The patient underwent EVAR of the abdominal aortic aneurysm on 12/18/2021, showed patent graft with no evidence of endoleak.     The patient's weakness continues to improve       Review of Systems  Neurological:  Positive for  weakness.  All other systems reviewed and are negative.      Objective:   Physical Exam Vitals reviewed.  HENT:     Head: Normocephalic.  Cardiovascular:     Rate and Rhythm: Normal rate.     Pulses:          Dorsalis pedis pulses are 1+ on the right side and 1+ on the left side.  Pulmonary:     Effort: Pulmonary effort is normal.  Skin:    General: Skin is warm and dry.  Neurological:     Mental Status: She is alert and oriented to person, place, and time.  Psychiatric:        Mood and Affect: Mood normal.        Behavior: Behavior normal.        Thought Content: Thought content normal.        Judgment: Judgment normal.     BP (!) 142/71 (BP Location: Left Arm)   Pulse 79   Resp 17   Ht 5\' 5"  (1.651 m)   Wt 139 lb (63 kg)   BMI 23.13 kg/m   Past Medical History:  Diagnosis Date   Arthritis    AV block, Mobitz II    a. 09/2016 s/p MDT [id number] Azure XT DR MRI DC PPM   CAD (coronary artery disease)    a. 11/2020 NSTEMI/Cath: LM mild diff dzs, LAD, diff dzs,  LCX 30d, RCA Ca2+ w/ diff dzs throughout, 62m. FFR cath not able to advance-->Med Rx.   Carotid artery disease ([name])    a. 09/2016 Carotid U/S: RICA 70-99%, LICA 50-69%;  b. 09/2016 CTA Neck: RICA 60, RCCA 30, LICA 70%, L Vert 60, L Basilar 70; c. 10/2019 s/p R CEA.   Essential hypertension    History of stress test    a. 12/2016 MV: EF 51%, no ischemia/infarct; b. 10/2019 MV: EF 61%, no ischemia/infarct.   Hyperlipidemia    Hypertrophic cardiomyopathy ([name])    a. 09/2016 Echo: EF 60-65%, no rwma, Gr1 DD, Ca2+ MV annulus; b. 11/2017 Echo: EF 70-75%, no rwma, Gr1 DD, mild AS vs dynamic LVOT obs. Nl RV fxn. Hyperdynamic LVEF w/ sev LVH; c. 10/2019 Echo: EF 70-75%, no rwma, gr1 DD, sev LVH, Gr1 DD, nl PASP, triv MR, mild-mod Ao sclerosis; c. 05/2020 Echo: EF 60-65%; e. 11/2020 Echo: EF 60-65%, sev LVH, nl RV fxn, Mild MR. Ao Sclerosis.   Presence of permanent cardiac pacemaker     Social History   Socioeconomic History    Marital status: Single    Spouse name: Not on file   Number of children: Not on file   Years of education: Not on file   Highest education level: Not on file  Occupational History   Not on file  Tobacco Use   Smoking status: Former    Packs/day: 1.00    Years: 30.00    Total pack years: 30.00    Types: Cigarettes    Quit date: 11/09/2018    Years since quitting: 3.2   Smokeless tobacco: Never  Vaping Use   Vaping Use: Never used  Substance and Sexual Activity   Alcohol use: No   Drug use: No   Sexual activity: Not on file  Other Topics Concern   Not on file  Social History Narrative   ** Merged History Encounter **       Independent at baseline Ambulates without any assistance. Lives by herself   Social Determinants of Health   Financial Resource Strain: Not on file  Food Insecurity: No Food Insecurity (12/10/2021)   Hunger Vital Sign    Worried About Running Out of Food in the Last Year: Never true    Ran Out of Food in the Last Year: Never true  Transportation Needs: No Transportation Needs (12/10/2021)   PRAPARE - Administrator, Civil Service (Medical): No    Lack of Transportation (Non-Medical): No  Physical Activity: Not on file  Stress: Not on file  Social Connections: Not on file  Intimate Partner Violence: Not At Risk (12/10/2021)   Humiliation, Afraid, Rape, and Kick questionnaire    Fear of Current or Ex-Partner: No    Emotionally Abused: No    Physically Abused: No    Sexually Abused: No    Past Surgical History:  Procedure Laterality Date   ABDOMINAL HYSTERECTOMY     CORONARY STENT INTERVENTION N/A 12/31/2020   Procedure: CORONARY STENT INTERVENTION;  Surgeon: Iran Ouch, MD;  Location: [facility] INVASIVE CV LAB;  Service: Cardiovascular;  Laterality: N/A;   ENDARTERECTOMY Right 11/16/2019   Procedure: ENDARTERECTOMY CAROTID;  Surgeon: Renford Dills, MD;  Location: [facility] ORS;  Service: Vascular;  Laterality: Right;   ENDOVASCULAR  REPAIR/STENT GRAFT N/A 12/18/2021   Procedure: ENDOVASCULAR REPAIR/STENT GRAFT;  Surgeon: Renford Dills, MD;  Location: [facility] INVASIVE CV LAB;  Service: Cardiovascular;  Laterality: N/A;   INTRAVASCULAR PRESSURE WIRE/FFR STUDY  N/A 12/13/2020   Procedure: INTRAVASCULAR PRESSURE WIRE/FFR STUDY;  Surgeon: Marykay Lex, MD;  Location: [facility] INVASIVE CV LAB;  Service: Cardiovascular;  Laterality: N/A;   LEFT HEART CATH AND CORONARY ANGIOGRAPHY N/A 12/13/2020   Procedure: LEFT HEART CATH AND CORONARY ANGIOGRAPHY;  Surgeon: Marykay Lex, MD;  Location: [facility] INVASIVE CV LAB;  Service: Cardiovascular;  Laterality: N/A;   LEFT HEART CATH AND CORONARY ANGIOGRAPHY N/A 12/31/2020   Procedure: LEFT HEART CATH AND CORONARY ANGIOGRAPHY;  Surgeon: Iran Ouch, MD;  Location: [facility] INVASIVE CV LAB;  Service: Cardiovascular;  Laterality: N/A;   MUSCLE BIOPSY Left 12/16/2021   Procedure: MUSCLE BIOPSY;  Surgeon: Campbell Lerner, MD;  Location: [facility] ORS;  Service: General;  Laterality: Left;   PACEMAKER IMPLANT N/A 10/08/2016   Procedure: Pacemaker Implant;  Surgeon: Marinus Maw, MD;  Location: [facility] INVASIVE CV LAB;  Service: Cardiovascular;  Laterality: N/A;    Family History  Problem Relation Age of Onset   Diabetes Mother    CAD Father     No Known Allergies     Latest Ref Rng & Units 12/20/2021    6:04 AM 12/20/2021   12:11 AM 12/19/2021    5:51 PM  CBC  WBC 4.0 - 10.5 K/uL 16.7     Hemoglobin 12.0 - 15.0 g/dL 7.4  8.0  8.7   Hematocrit 36.0 - 46.0 % 22.2  23.0  25.7   Platelets 150 - 400 K/uL 96         CMP     Component Value Date/Time   NA 142 12/20/2021 0604   K 3.9 12/20/2021 0604   CL 112 (H) 12/20/2021 0604   CO2 25 12/20/2021 0604   GLUCOSE 115 (H) 12/20/2021 0604   BUN 32 (H) 12/20/2021 0604   CREATININE 0.84 12/20/2021 0604   CALCIUM 7.8 (L) 12/20/2021 0604   PROT 6.5 12/13/2021 0436   ALBUMIN 2.9 (L) 12/13/2021 0436   AST 56 (H) 12/13/2021 0436   ALT 95 (H)  12/13/2021 0436   ALKPHOS 79 12/13/2021 0436   BILITOT 0.8 12/13/2021 0436   GFRNONAA >60 12/20/2021 0604   GFRAA >60 11/18/2019 0619     No results found.     Assessment & Plan:   1. Primary hypertension Continue antihypertensive medications as already ordered, these medications have been reviewed and there are no changes at this time.  2. Dyslipidemia Continue statin as ordered and reviewed, no changes at this time  3. Penetrating atherosclerotic ulcer of aorta ([name]) Recommend:  The patient has noted penetrating ulcer of the descending thoracic aorta.  This should undergo repair. Patient is status post CT scan of the abdominal aorta. The patient is a candidate for endovascular repair.   The patient will continue antiplatelet therapy as prescribed (since the patient is undergoing endovascular repair as opposed to open repair) as well as aggressive management of hyperlipidemia. Exercise is again strongly encouraged.   The patient is reminded that lifetime routine surveillance is a necessity with an endograft.   The risks and benefits of thoracic aortic aneurysm repair was discussed.  This will be done with an endovascular graft.  Given the patient's recent abdominal aortic aneurysm repair we will delay this for approximately 6 to 8 weeks to minimize the risk of paraplegia.  Patient will follow-up with me in the office after the surgery.    Current Outpatient Medications on File Prior to Visit  Medication Sig Dispense Refill   acetaminophen (TYLENOL) 325 MG tablet  Take 650 mg by mouth every 4 (four) hours as needed for mild pain or moderate pain.     albuterol (VENTOLIN HFA) 108 (90 Base) MCG/ACT inhaler Inhale 1-2 puffs into the lungs every 6 (six) hours as needed for wheezing or shortness of breath.     alum & mag hydroxide-simeth (MAALOX/MYLANTA) 200-200-20 MG/5ML suspension Take 15 mLs by mouth every 6 (six) hours as needed for flatulence. 355 mL 0   amLODipine (NORVASC) 2.5  MG tablet Take 2.5 mg by mouth daily.     aspirin EC 81 MG EC tablet Take 1 tablet (81 mg total) by mouth daily. Swallow whole. 30 tablet 11   calcium carbonate (OS-CAL - DOSED IN MG OF ELEMENTAL CALCIUM) 1250 (500 Ca) MG tablet Take 1 tablet (1,250 mg total) by mouth 2 (two) times daily with a meal. 60 tablet 2   cephALEXin (KEFLEX) 500 MG capsule Take 500 mg by mouth 2 (two) times daily.     clopidogrel (PLAVIX) 75 MG tablet Take 75 mg by mouth daily.     cyanocobalamin 1000 MCG tablet Take 1 tablet (1,000 mcg total) by mouth daily. 30 tablet 2   dapagliflozin propanediol (FARXIGA) 10 MG TABS tablet Take 10 mg by mouth daily.     docusate sodium (COLACE) 100 MG capsule Take 100 mg by mouth 2 (two) times daily.     feeding supplement (ENSURE ENLIVE / ENSURE PLUS) LIQD Take 237 mLs by mouth 3 (three) times daily between meals. 237 mL 12   folic acid (FOLVITE) 1 MG tablet Take 1 tablet (1 mg total) by mouth daily. 30 tablet 2   KERENDIA 20 MG TABS Take 20 mg by mouth daily.     loperamide (IMODIUM) 2 MG capsule Take 2 mg by mouth as needed.     meclizine (ANTIVERT) 12.5 MG tablet Take 0.5 mg by mouth as needed for dizziness.     mirtazapine (REMERON) 7.5 MG tablet Take 7.5 mg by mouth at bedtime.     Multiple Vitamin (MULTIVITAMIN WITH MINERALS) TABS tablet Take 1 tablet by mouth daily. 30 tablet 2   oxyCODONE-acetaminophen (PERCOCET/ROXICET) 5-325 MG tablet Take 1 tablet by mouth every 6 (six) hours as needed for moderate pain. 30 tablet 0   pantoprazole (PROTONIX) 40 MG tablet Take 1 tablet (40 mg total) by mouth daily. 30 tablet 2   polyethylene glycol (MIRALAX) 17 g packet Take 17 g by mouth daily as needed for mild constipation. 14 each 2   potassium chloride (KLOR-CON) 10 MEQ tablet Take 10 mEq by mouth daily.     predniSONE (DELTASONE) 20 MG tablet Take 3 tablets (60 mg total) by mouth daily with breakfast. Take it till you are seen by Rheumatology 60 tablet 0   rosuvastatin (CRESTOR) 40 MG  tablet TAKE 1 TABLET BY MOUTH DAILY 90 tablet 0   TRELEGY ELLIPTA 200-62.5-25 MCG/ACT AEPB Inhale 1 puff into the lungs daily.     Vibegron (GEMTESA) 75 MG TABS Take 75 mg by mouth daily.     Vitamin D, Ergocalciferol, (DRISDOL) 1.25 MG (50000 UNIT) CAPS capsule Take 1 capsule (50,000 Units total) by mouth every 7 (seven) days. 5 capsule 0   metoprolol succinate (TOPROL-XL) 50 MG 24 hr tablet Take 1 tablet (50 mg total) by mouth daily. 30 tablet 2   No current facility-administered medications on file prior to visit.    There are no Patient Instructions on file for this visit. No follow-ups on file.   Erma Pinto  Ow[place] Shark, NP

## 2022-02-03 ENCOUNTER — Ambulatory Visit (INDEPENDENT_AMBULATORY_CARE_PROVIDER_SITE_OTHER): Payer: Medicare HMO | Admitting: Vascular Surgery
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
## 2022-02-06 ENCOUNTER — Encounter: Payer: Self-pay | Admitting: Surgery

## 2022-02-06 ENCOUNTER — Ambulatory Visit (INDEPENDENT_AMBULATORY_CARE_PROVIDER_SITE_OTHER): Payer: Medicare HMO | Admitting: Surgery

## 2022-02-06 VITALS — BP 150/63 | HR 82 | Temp 98.0°F | Ht 65.5 in | Wt 139.0 lb

## 2022-02-06 DIAGNOSIS — L7632 Postprocedural hematoma of skin and subcutaneous tissue following other procedure: Secondary | ICD-10-CM

## 2022-02-06 DIAGNOSIS — Z09 Encounter for follow-up examination after completed treatment for conditions other than malignant neoplasm: Secondary | ICD-10-CM | POA: Diagnosis not present

## 2022-02-06 NOTE — Patient Instructions (Signed)
Referral to wound care clinic. Someone form their office will call to schedule an appointment. Please call with any questions or concerns.

## 2022-02-11 NOTE — Progress Notes (Deleted)
Initial neurology clinic note  SERVICE DATE: ***  Reason for Evaluation: Consultation requested by Lorenza Chick, MD for an opinion regarding ***. My final recommendations will be communicated back to the requesting physician by way of shared medical record or letter to requesting physician via [place] mail.  HPI: This is [name], a 76 y.o. ***-handed female with a medical history of CAD, heart block s/p PM, HTN, HLD, hypertrophic cardiomyopathy, OA, anemia of chronic disease, and right carotid artery stenosis s/p right carotid endarterectomy*** who presents to neurology clinic with the chief complaint of weakness***. The patient is accompanied by ***.  ***  Patient was admitted from 12/09/21 to 12/23/21. Patient had numbness of both legs with saddle anesthesia about 1 week prior to admission. The numbness resolved but patient was not able to ambulate on 12/09/21. She also mentioned bruising of arms for previous 2-3 days. UA showed myoglobinuria and CK elevated to 6906. TSH was normal. A1c was 6.4%. B12 was 339 with elevated MMA. B12 supplementation (1000 mcg daily) was started. Vit D was low at 15.65. Cr was also elevated to 2.08. Statin was held and patient was started on prednisone 1 mg/kg daily on 12/10/21. Neurology saw patient and per initial exam, deltoids were 4/5, hip flexion 4/5, knee flexion 4/5. Vibration was reduced in bilateral great toes. There may have also been hyperreflexia, but unclear as patient did not relax well.   Myopathy abs were sent and were negative including Jo1, PL-7, PL-12, EJ, OJ, SRP, Mi-2, NXP-2, SAE1, PM/Scl-100, Ku, SSA, U1 RNP, U2 RNP, U3 RNP, and HMG-CoA. ANA was negative. CRP and ESR were essentially normal. CT chest, abdomen, pelvis showed a 9 mm nodule in left kidney suspicious for renal neoplasm and stenosis of celiac and superior mesenteric arteries. 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 Post op, patient was hypotensive (shock) due to blood loss and required 3 units of pRBC.  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  Oncology saw patient and recommended further imaging. CT renal abdomen w/wo contrast on 12/15/21 showed lesion and it appeared consistent with a benign hemorrhagic or proteinaceous cyst (further follow up not required).  Patient's strength improved during admission, and she was discharged on 12/23/21 on prednisone 65 mg daily until rheumatology and neurology follow up (also on protonix and Ca). She was also to continue vit D 50000 units weekly for 8 weeks and B12 1000 mcg daily. Statin continued to be held. 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  The patient has not*** had similar episodes of symptoms in the past. ***  Muscle bulk loss? *** Muscle pain? ***  Cramps/Twitching? *** Suggestion of myotonia/difficulty relaxing after contraction? ***  Fatigable weakness?*** Does strength improve after brief exercise?***  Able to brush hair/teeth without difficulty? *** Able to button shirts/use zips? *** Clumsiness/dropping grasped objects?*** Can you arise from squatted position easily? *** Able to get out of chair without using arms? *** Able to walk up steps easily? *** Use an assistive device to walk? *** Significant imbalance with walking? *** Falls?*** Any change in urine color, especially after exertion/physical activity? ***  The patient denies*** symptoms suggestive of oculobulbar weakness including diplopia, ptosis, dysphagia, poor saliva control, dysarthria/dysphonia, impaired mastication, facial weakness/droop.  There are  no*** neuromuscular respiratory weakness symptoms, particularly orthopnea>dyspnea.   Pseudobulbar affect  is absent***.  The patient does not*** report symptoms referable to autonomic dysfunction including impaired sweating, heat or cold intolerance, excessive mucosal dryness, gastroparetic early satiety, postprandial abdominal bloating, constipation, bowel or bladder dyscontrol, erectile dysfunction*** or syncope/presyncope/orthostatic intolerance.  There are no*** complaints relating to other symptoms of small fiber modalities including paresthesia/pain.  The patient has not *** noticed any recent skin rashes nor does he*** report any constitutional symptoms like fever, night sweats, anorexia or unintentional weight loss.  EtOH use: ***  Restrictive diet? *** Family history of neuropathy/myopathy/NM disease?***  Previous labs, electrodiagnostics, and neuroimaging are summarized below, but pertinent findings include***  Any biopsy done? *** Current medications being tried for the patient's symptoms include ***  Prior medications that have been tried: ***   MEDICATIONS:  Outpatient Encounter Medications as of 02/20/2022  Medication Sig   acetaminophen (TYLENOL) 325 MG tablet Take 650 mg by mouth every 4 (four) hours as needed for mild pain or moderate pain.   albuterol (VENTOLIN HFA) 108 (90 Base) MCG/ACT inhaler Inhale 1-2 puffs into the lungs every 6 (six) hours as needed for wheezing or shortness of breath.   alum & mag hydroxide-simeth (MAALOX/MYLANTA) 200-200-20 MG/5ML suspension Take 15 mLs by mouth every 6 (six) hours as needed for flatulence.   amLODipine (NORVASC) 2.5 MG tablet Take 2.5 mg by mouth daily.   aspirin EC 81 MG EC tablet Take 1 tablet (81 mg total) by mouth daily. Swallow whole.   calcium carbonate (OS-CAL - DOSED IN MG OF ELEMENTAL CALCIUM) 1250 (500 Ca) MG tablet Take 1 tablet (1,250 mg total) by mouth 2 (two) times daily with a meal.   cephALEXin (KEFLEX) 500 MG capsule Take 500 mg by mouth 2 (two) times daily.   clopidogrel (PLAVIX) 75 MG tablet Take 75 mg by mouth  daily.   cyanocobalamin 1000 MCG tablet Take 1 tablet (1,000 mcg total) by mouth daily.   dapagliflozin propanediol (FARXIGA) 10 MG TABS tablet Take 10 mg by mouth daily.   docusate sodium (COLACE) 100 MG capsule Take 100 mg by mouth 2 (two) times daily.   feeding supplement (ENSURE ENLIVE / ENSURE PLUS) LIQD Take 237 mLs by mouth 3 (three) times daily between meals.   folic acid (FOLVITE) 1 MG tablet Take 1 tablet (1 mg total) by mouth daily.   KERENDIA 20 MG TABS Take 20 mg by mouth daily.   loperamide (IMODIUM) 2 MG capsule Take 2 mg by mouth as needed.   meclizine (ANTIVERT) 12.5 MG tablet Take 0.5 mg by mouth as needed for dizziness.   metoprolol succinate (TOPROL-XL) 50 MG 24 hr tablet Take 1 tablet (50 mg total) by mouth daily.   mirtazapine (REMERON) 7.5 MG tablet Take 7.5 mg by mouth at bedtime.   Multiple Vitamin (MULTIVITAMIN WITH MINERALS) TABS tablet Take 1 tablet by mouth daily.   oxyCODONE-acetaminophen (PERCOCET/ROXICET) 5-325 MG tablet Take 1 tablet by mouth every 6 (six) hours as needed for moderate pain.   pantoprazole (PROTONIX) 40 MG tablet Take 1 tablet (40 mg total) by mouth daily.   polyethylene glycol (MIRALAX) 17 g packet Take 17 g by mouth daily as needed for mild constipation.   potassium chloride (KLOR-CON) 10 MEQ tablet Take 10 mEq by mouth daily.   predniSONE (DELTASONE) 20 MG tablet Take 3 tablets (60 mg total) by mouth daily with breakfast. Take it till you are seen by Rheumatology   rosuvastatin (CRESTOR) 40  MG tablet TAKE 1 TABLET BY MOUTH DAILY   TRELEGY ELLIPTA 200-62.5-25 MCG/ACT AEPB Inhale 1 puff into the lungs daily.   Vibegron (GEMTESA) 75 MG TABS Take 75 mg by mouth daily.   Vitamin D, Ergocalciferol, (DRISDOL) 1.25 MG (50000 UNIT) CAPS capsule Take 1 capsule (50,000 Units total) by mouth every 7 (seven) days.   No facility-administered encounter medications on file as of 02/20/2022.    PAST MEDICAL HISTORY: Past Medical History:  Diagnosis Date    Arthritis    AV block, Mobitz II    a. 09/2016 s/p MDT [id number] Azure XT DR MRI DC PPM   CAD (coronary artery disease)    a. 11/2020 NSTEMI/Cath: LM mild diff dzs, LAD, diff dzs, LCX 30d, RCA Ca2+ w/ diff dzs throughout, 4m FFR cath not able to advance-->Med Rx.   Carotid artery disease ([place]    a. 09/2016 Carotid U/S: RICA 719-41% LICA 574-08%  b. 81/[id number] Neck: RICA 60, RCCA 30, LICA 785% L Vert 60, L Basilar 70; c. 10/2019 s/p R CEA.   Essential hypertension    History of stress test    a. 12/2016 MV: EF 51%, no ischemia/infarct; b. 10/2019 MV: EF 61%, no ischemia/infarct.   Hyperlipidemia    Hypertrophic cardiomyopathy ([facility]    a. 09/2016 Echo: EF 60-65%, no rwma, Gr1 DD, Ca2+ MV annulus; b. 11/2017 Echo: EF 70-75%, no rwma, Gr1 DD, mild AS vs dynamic LVOT obs. Nl RV fxn. Hyperdynamic LVEF w/ sev LVH; c. 10/2019 Echo: EF 70-75%, no rwma, gr1 DD, sev LVH, Gr1 DD, nl PASP, triv MR, mild-mod Ao sclerosis; c. 05/2020 Echo: EF 60-65%; e. 11/2020 Echo: EF 60-65%, sev LVH, nl RV fxn, Mild MR. Ao Sclerosis.   Presence of permanent cardiac pacemaker     PAST SURGICAL HISTORY: Past Surgical History:  Procedure Laterality Date   ABDOMINAL HYSTERECTOMY     CORONARY STENT INTERVENTION N/A 12/31/2020   Procedure: CORONARY STENT INTERVENTION;  Surgeon: AWellington Hampshire MD;  [facility];  Service: Cardiovascular;  Laterality: N/A;   ENDARTERECTOMY Right 11/16/2019   Procedure: ENDARTERECTOMY CAROTID;  Surgeon: SKatha Cabal MD;  Location: [facility];  Service: Vascular;  Laterality: Right;   ENDOVASCULAR REPAIR/STENT GRAFT N/A 12/18/2021   Procedure: ENDOVASCULAR REPAIR/STENT GRAFT;  Surgeon: SKatha Cabal MD;  [facility];  Service: Cardiovascular;  Laterality: N/A;   INTRAVASCULAR PRESSURE WIRE/FFR STUDY N/A 12/13/2020   Procedure: INTRAVASCULAR PRESSURE WIRE/FFR STUDY;  Surgeon: HLeonie Man MD;  [facility];  Service:  Cardiovascular;  Laterality: N/A;   LEFT HEART CATH AND CORONARY ANGIOGRAPHY N/A 12/13/2020   Procedure: LEFT HEART CATH AND CORONARY ANGIOGRAPHY;  Surgeon: HLeonie Man MD;  [facility];  Service: Cardiovascular;  Laterality: N/A;   LEFT HEART CATH AND CORONARY ANGIOGRAPHY N/A 12/31/2020   Procedure: LEFT HEART CATH AND CORONARY ANGIOGRAPHY;  Surgeon: AWellington Hampshire MD;  [facility];  Service: Cardiovascular;  Laterality: N/A;   MUSCLE BIOPSY Left 12/16/2021   Procedure: MUSCLE BIOPSY;  Surgeon: RRonny Bacon MD;  Location: [facility];  Service: General;  Laterality: Left;   PACEMAKER IMPLANT N/A 10/08/2016   Procedure: Pacemaker Implant;  Surgeon: TEvans Lance MD;  [facility];  Service: Cardiovascular;  Laterality: N/A;    ALLERGIES: No Known Allergies  FAMILY HISTORY: Family History  Problem Relation Age of Onset   Diabetes Mother    CAD Father  SOCIAL HISTORY: Social History   Tobacco Use   Smoking status: Former    Packs/day: 1.00    Years: 30.00    Total pack years: 30.00    Types: Cigarettes    Quit date: 11/09/2018    Years since quitting: 3.2    Passive exposure: Past   Smokeless tobacco: Never  Vaping Use   Vaping Use: Never used  Substance Use Topics   Alcohol use: No   Drug use: No   Social History   Social History Narrative   ** Merged History Encounter **       Independent at baseline Ambulates without any assistance. Lives by herself     OBJECTIVE: PHYSICAL EXAM: There were no vitals taken for this visit.  General:*** General appearance: Awake and alert. No distress. Cooperative with exam.  Skin: No obvious rash or jaundice. HEENT: Atraumatic. Anicteric. Lungs: Non-labored breathing on room air  Heart: Regular Abdomen: Soft, non tender. Extremities: No edema. No obvious deformity.  Musculoskeletal: No obvious joint swelling. Psych: Affect  appropriate.  Neurological: Mental Status: Alert. Speech fluent. No pseudobulbar affect Cranial Nerves: CNII: No RAPD. Visual fields grossly intact. CNIII, IV, VI: PERRL. No nystagmus. EOMI. CN V: Facial sensation intact bilaterally to fine touch. [place] clench strong. Jaw jerk***. CN VII: Facial muscles symmetric and strong. No ptosis at rest or after sustained upgaze***. CN VIII: Hearing grossly intact bilaterally. CN IX: No hypophonia. CN X: Palate elevates symmetrically. CN XI: Full strength shoulder shrug bilaterally. CN XII: Tongue protrusion full and midline. No atrophy or fasciculations. No significant dysarthria*** Motor: Tone is ***. *** fasciculations in *** extremities. *** atrophy. No grip or percussive myotonia.***  Individual muscle group testing (MRC grade out of 5):  Movement     Neck flexion ***    Neck extension ***     Right Left   Shoulder abduction *** ***   Shoulder adduction *** ***   Shoulder ext rotation *** ***   Shoulder int rotation *** ***   Elbow flexion *** ***   Elbow extension *** ***   Wrist extension *** ***   Wrist flexion *** ***   Finger abduction - FDI *** ***   Finger abduction - ADM *** ***   Finger extension *** ***   Finger distal flexion - 2/3 *** ***   Finger distal flexion - 4/5 *** ***   Thumb flexion - FPL *** ***   Thumb abduction - APB *** ***    Hip flexion *** ***   Hip extension *** ***   Hip adduction *** ***   Hip abduction *** ***   Knee extension *** ***   Knee flexion *** ***   Dorsiflexion *** ***   Plantarflexion *** ***   Inversion *** ***   Eversion *** ***   Great toe extension *** ***   Great toe flexion *** ***     Reflexes:  Right Left   Bicep *** ***   Tricep *** ***   BrRad *** ***   Knee *** ***   Ankle *** ***    Pathological Reflexes: Babinski: *** response bilaterally*** Hoffman: *** Troemner: *** Pectoral: *** Palmomental: *** Facial: *** Midline tap:  *** Sensation: Pinprick: *** Vibration: *** Temperature: *** Proprioception: *** Coordination: Intact finger-to- nose-finger bilaterally. Romberg negative.*** Gait: Able to rise from chair with arms crossed unassisted. Normal, narrow-based gait. Able to tandem walk. Able to walk on toes and heels.***  Lab and Test Review: Internal labs:  CK  Total  Latest Ref Rng 38 - 234 U/L  05/18/2020 65   01/02/2021 58   12/09/2021 6,906 (H)   12/10/2021 6,635 (H)    6,079 (H)   12/11/2021 4,647 (H)   12/13/2021 2,301 (H)     Aldolase (12/10/21): 26.7 B12 (12/09/21): 339; MMA elevated to 514 A1c (12/09/21): 6.4 Vit D (12/10/21): 15.65 LDH (12/14/21): elevated to 239  Normal or unremarkable: TSH, folate, RPR, ANA, ESR, CRP, Anti-Jo 1, Myopathy panel, HMG-CoAr abs, CA 19-9  ***  Imaging: CT head (12/09/21): FINDINGS: Brain: Generalized atrophy. Normal ventricular morphology. No midline shift or mass effect. Small vessel chronic ischemic changes of deep cerebral white matter. BILATERAL old basal ganglia lacunar infarcts. Small old infarct RIGHT cerebellum. No intracranial hemorrhage, mass lesion, evidence of acute infarction, or extra-axial fluid collection.   Vascular: Atherosclerotic calcifications of internal carotid arteries at skull base. No hyperdense vessels.   Skull: Intact   Sinuses/Orbits: Partial opacification of BILATERAL ethmoid air cells. Remaining paranasal sinuses and mastoid air cells clear   Other: Significant ossification within falx.   IMPRESSION: Atrophy with small vessel chronic ischemic changes of deep cerebral white matter.   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   No acute intracranial abnormalities.  CT cervical, thoracic, lumbar spine wo contrast (12/09/21): FINDINGS: CT CERVICAL SPINE FINDINGS   Alignment: Normal.   Skull base and vertebrae: Multilevel mild-to-moderate degenerative changes spine most prominent at the C5  through C7 levels. Associated intervertebral disc space narrowing at these levels. No severe osseous central canal or neural foraminal stenosis. No acute fracture. No aggressive appearing focal osseous lesion or focal pathologic process.   Soft tissues and spinal canal: No prevertebral fluid or swelling. No visible canal hematoma.   Upper chest: Centrilobular emphysematous changes.   Other: 1.2 cm hypodense lesion within the right thyroid gland. Not clinically significant; no follow-up imaging recommended (ref: J Am Coll Radiol. 2015 Feb;12(2): 143-50).Atherosclerotic plaque of the carotid arteries within the neck.   CT THORACIC SPINE FINDINGS   Alignment: Normal.   Vertebrae: No severe osseous neural foraminal or central canal stenosis. No acute fracture or focal pathologic process.   Paraspinal and other soft tissues: Negative.   Disc levels: Developing intervertebral disc space vacuum phenomenon at a couple levels. Otherwise maintained.   CT LUMBAR SPINE FINDINGS   Segmentation: 5 lumbar type vertebrae.   Alignment: Normal.   Vertebrae: Mild multilevel degenerative changes spine. Diffusely decreased bone density. No severe osseous central canal or neural foraminal stenosis. No acute fracture or focal pathologic process.   Paraspinal and other soft tissues: Negative.   Disc levels: Intervertebral disc space vacuum phenomenon at the L5-S1 level.   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 Redemonstration of several focal areas of distension of the abdominal aorta and with no aneurysm. Severe atherosclerotic plaque. Four-vessel coronary calcification. Partially visualized cardiac leads.   IMPRESSION: 1. No acute displaced fracture  or traumatic listhesis of the cervical, thoracic, lumbar spine. 2. No severe osseous neural foraminal or central canal stenosis of the cervical, thoracic, lumbar spine. 3. 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 4. 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 5. Aortic Atherosclerosis (ICD10-I70.0) - severe including four-vessel coronary calcification. 6.  Emphysema (ICD10-J43.9).  CT chest, abdomen, pelvis w contrast (12/12/21): FINDINGS: CT CHEST FINDINGS   Cardiovascular: Extensive multi-vessel coronary artery calcification. Calcification of the aortic valve leaflets noted. Global cardiac size within normal limits. Left subclavian dual lead pacemaker in place with leads within the right atrium and right ventricle. No pericardial effusion. Central pulmonary arteries are of normal caliber. Extensive atherosclerotic calcification within the thoracic aorta. No aortic aneurysm. Interval development of a penetrating atherosclerotic ulcer within the distal descending thoracic aorta best seen on axial image # 38/2 and coronal image # 89/5 measuring 9 mm in diameter and 5 mm in depth. No periaortic hematoma seen.   Mediastinum/Nodes: Visualized thyroid is unremarkable. No pathologic thoracic adenopathy. Esophagus unremarkable.   Lungs/Pleura: Mild left basilar dependent atelectasis. Lungs are otherwise clear. No pneumothorax or pleural effusion. Central airways are widely patent.   Musculoskeletal: No chest wall mass or suspicious bone lesions identified.   CT ABDOMEN PELVIS FINDINGS   Hepatobiliary: The gallbladder is unremarkable. There is mild intrahepatic and marked extrahepatic biliary ductal dilation with the extrahepatic bile duct measuring up to 16 mm in diameter, stable since prior examination. A distal  obstructing process, such as a biliary stricture or ampullary stenosis, is not excluded. No enhancing intrahepatic mass identified.   Pancreas: Unremarkable   Spleen: Unremarkable   Adrenals/Urinary Tract: The adrenal glands are unremarkable. The kidneys are normal in size and position. 9 mm hypoenhancing cortical nodule is seen within the interpolar region of the left kidney which demonstrates interval increase in size since prior examination where this measured approximately 7 mm and is suspicious for a solid renal neoplasm. No hydronephrosis. No intrarenal or ureteral calculi. The bladder is unremarkable.   Stomach/Bowel: Moderate colonic stool burden without evidence of obstruction. Mild sigmoid diverticulosis without superimposed acute inflammatory change. Stomach, small bowel, and large bowel are otherwise unremarkable. Appendix normal. No free intraperitoneal gas or fluid.   Vascular/Lymphatic: Extensive aortoiliac atherosclerotic calcification. Suspected hemodynamically significant stenoses involving the celiac axis and superior mesenteric artery origins as well as the renal ostia bilaterally, not well characterized on this examination.   Broad-based penetrating atherosclerotic ulcer involving the left lateral infrarenal abdominal aorta appears stable in size measuring 18 x 21 mm with a depth of approximately 6 mm but demonstrates a small amount of increasing mural thrombus, best seen on axial image # 71/2 and coronal image # 61/5.   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 This is best measured on the current examination on coronal image # 69/5 and axial image # 86/2. Aneurysm demonstrating a limited focal dissection involving the right common iliac artery is unchanged measuring 2.0 cm in diameter. 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   No pathologic adenopathy within the abdomen and pelvis.   Reproductive: Status post hysterectomy. No adnexal masses.   Other: Subcutaneous gas within the left lower quadrant abdominal wall likely relates to subcutaneous injection. No abdominal wall hernia. No abdominopelvic ascites.   Musculoskeletal: Degenerative changes are seen within the lumbar spine. No acute bone abnormality. No lytic or blastic bone lesion.   IMPRESSION: 1. Interval development of a penetrating atherosclerotic ulcer within the distal descending thoracic aorta measuring 9 mm in diameter and 5  mm in depth. No periaortic hematoma seen. 2. Stable size of a broad-based penetrating atherosclerotic ulcer involving the left lateral infrarenal abdominal aorta measuring 21 mm in diameter and 7 mm in depth. 3. 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 4. 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 5. Extensive multi-vessel coronary artery calcification. 6. 9 mm hypoenhancing cortical nodule within the interpolar region of the left kidney demonstrating interval increase in size since prior examination and suspicious for a solid renal neoplasm. This could be further assessed with dedicated renal mass protocol CT or MRI examination if clinically indicated. 7. Suspected hemodynamically significant stenoses involving the celiac axis and superior mesenteric artery origins as well as the renal ostia bilaterally, not well characterized on this examination. Clinical correlation for signs and symptoms of hemodynamically significant renal artery stenosis or chronic mesenteric ischemia is recommended. If present, this would be better assessed with CT arteriography. 8. Stable marked extrahepatic biliary ductal dilation.  A distal obstructing process, such as a biliary stricture or ampullary stenosis, is not excluded. Correlation with liver enzymes would be helpful for further evaluation. If abnormal, this could be further assessed with ERCP or MRCP examination.   Aortic Atherosclerosis (ICD10-I70.0).  CT renal abdomen w/wo contrast (12/15/21): FINDINGS: Lower chest: No acute abnormality. Aortic valve calcifications. Extensive three-vessel coronary artery calcifications.   Hepatobiliary: No solid liver abnormality is seen. No gallstones. Unchanged biliary ductal dilatation, the common bile duct measuring up to 1.5 cm, however tapering to the ampulla.   Pancreas: Unremarkable. No pancreatic ductal dilatation or surrounding inflammatory changes.   Spleen: Normal in size without significant abnormality.   Adrenals/Urinary Tract: Adrenal glands are unremarkable. Redemonstrated 0.9 x 0.9 cm partially exophytic lesion of the lateral midportion of the left kidney, which is of high precontrast attenuation measuring 77 HU, and without evidence of postcontrast enhancement, 81 HU (series 3, image 23). Kidneys are otherwise normal, without renal calculi, solid lesion, or hydronephrosis.   Stomach/Bowel: Stomach is within normal limits. No evidence of bowel wall thickening, distention, or inflammatory changes. Large burden of stool throughout the included colon.   Vascular/Lymphatic: Severe aortic atherosclerosis. Unchanged, penetrating ulcerations of the right and left aspects of the infrarenal abdominal aorta (series 3, image 28, 38).   Other: No abdominal wall hernia or abnormality. No ascites.   Musculoskeletal: No acute or significant osseous findings.   IMPRESSION: 1. Redemonstrated 0.9 x 0.9 cm partially exophytic lesion of the lateral midportion of the left kidney, consistent with a benign hemorrhagic or proteinaceous cyst. No further follow-up or characterization is required. 2. Unchanged  biliary ductal dilatation, the common bile duct measuring up to 1.5 cm, however tapering to the ampulla without evident obstructing calculus or other lesion. 3. Severe aortic atherosclerosis. Unchanged, penetrating ulcerations of the right and left aspects of the infrarenal abdominal aorta. 4. Coronary artery disease. 5. Aortic valve calcifications. Correlate for echocardiographic evidence of aortic valve dysfunction.   Aortic Atherosclerosis (ICD10-I70.0).  Left quad muscle biopsy (12/16/21):     ***  ASSESSMENT: KENT BRAUNSCHWEIG is a 76 y.o. female who presents for evaluation of ***. *** has a relevant medical history of ***. *** neurological examination is pertinent for ***. Available diagnostic data is significant for ***. This constellation of symptoms and objective data would most likely localize to ***. ***  PLAN: -Blood work: *** ***Prednisone? ***Protonix, PCP ppx?  -Return to clinic ***  The impression above  as well as the plan as outlined below were extensively discussed with the patient (in the company of ***) who voiced understanding. All questions were answered to their satisfaction.  The patient was counseled on pertinent fall precautions per the printed material provided today, and as noted under the "Patient Instructions" section below.***  When available, results of the above investigations and possible further recommendations will be communicated to the patient via telephone/MyChart. Patient to call office if not contacted after expected testing turnaround time.   Total time spent reviewing records, interview, history/exam, documentation, and coordination of care on day of encounter:  *** min   Thank you for allowing me to participate in patient's care.  If I can answer any additional questions, I would be pleased to do so.  Kai Levins, MD   CC: Remi Haggard, [place] [id number]  CC: Referring provider: Lorenza Chick, MD [address] [place],  [place] [address]

## 2022-02-18 ENCOUNTER — Emergency Department (HOSPITAL_COMMUNITY): Payer: Medicare HMO

## 2022-02-18 ENCOUNTER — Emergency Department (HOSPITAL_COMMUNITY): Payer: Medicare HMO | Admitting: Anesthesiology

## 2022-02-18 ENCOUNTER — Inpatient Hospital Stay (HOSPITAL_COMMUNITY)
Admission: EM | Admit: 2022-02-18 | Discharge: 2022-02-27 | DRG: 023 | Disposition: A | Discharge status: Hospice | Payer: Medicare HMO | Attending: Neurology | Admitting: Neurology

## 2022-02-18 ENCOUNTER — Encounter (HOSPITAL_COMMUNITY): Payer: Self-pay | Admitting: Neurology

## 2022-02-18 ENCOUNTER — Encounter (HOSPITAL_COMMUNITY)
Admission: EM | Disposition: A | Discharge status: Hospice | Payer: Self-pay | Source: Home / Self Care | Attending: Neurology

## 2022-02-18 DIAGNOSIS — Z833 Family history of diabetes mellitus: Secondary | ICD-10-CM

## 2022-02-18 DIAGNOSIS — I719 Aortic aneurysm of unspecified site, without rupture: Secondary | ICD-10-CM | POA: Diagnosis present

## 2022-02-18 DIAGNOSIS — E870 Hyperosmolality and hypernatremia: Secondary | ICD-10-CM | POA: Diagnosis not present

## 2022-02-18 DIAGNOSIS — Z515 Encounter for palliative care: Secondary | ICD-10-CM | POA: Diagnosis not present

## 2022-02-18 DIAGNOSIS — R1310 Dysphagia, unspecified: Secondary | ICD-10-CM | POA: Diagnosis present

## 2022-02-18 DIAGNOSIS — I6523 Occlusion and stenosis of bilateral carotid arteries: Secondary | ICD-10-CM | POA: Diagnosis present

## 2022-02-18 DIAGNOSIS — I639 Cerebral infarction, unspecified: Principal | ICD-10-CM

## 2022-02-18 DIAGNOSIS — I252 Old myocardial infarction: Secondary | ICD-10-CM | POA: Diagnosis not present

## 2022-02-18 DIAGNOSIS — I2510 Atherosclerotic heart disease of native coronary artery without angina pectoris: Secondary | ICD-10-CM | POA: Diagnosis not present

## 2022-02-18 DIAGNOSIS — Y838 Other surgical procedures as the cause of abnormal reaction of the patient, or of later complication, without mention of misadventure at the time of the procedure: Secondary | ICD-10-CM | POA: Diagnosis not present

## 2022-02-18 DIAGNOSIS — I63411 Cerebral infarction due to embolism of right middle cerebral artery: Secondary | ICD-10-CM | POA: Diagnosis present

## 2022-02-18 DIAGNOSIS — E1151 Type 2 diabetes mellitus with diabetic peripheral angiopathy without gangrene: Secondary | ICD-10-CM | POA: Diagnosis present

## 2022-02-18 DIAGNOSIS — G8194 Hemiplegia, unspecified affecting left nondominant side: Secondary | ICD-10-CM | POA: Diagnosis present

## 2022-02-18 DIAGNOSIS — T80818A Extravasation of other vesicant agent, initial encounter: Secondary | ICD-10-CM | POA: Diagnosis not present

## 2022-02-18 DIAGNOSIS — Z1152 Encounter for screening for COVID-19: Secondary | ICD-10-CM

## 2022-02-18 DIAGNOSIS — J449 Chronic obstructive pulmonary disease, unspecified: Secondary | ICD-10-CM | POA: Diagnosis present

## 2022-02-18 DIAGNOSIS — I651 Occlusion and stenosis of basilar artery: Secondary | ICD-10-CM | POA: Diagnosis present

## 2022-02-18 DIAGNOSIS — I10 Essential (primary) hypertension: Secondary | ICD-10-CM | POA: Diagnosis not present

## 2022-02-18 DIAGNOSIS — Z87891 Personal history of nicotine dependence: Secondary | ICD-10-CM

## 2022-02-18 DIAGNOSIS — Z8249 Family history of ischemic heart disease and other diseases of the circulatory system: Secondary | ICD-10-CM

## 2022-02-18 DIAGNOSIS — Z955 Presence of coronary angioplasty implant and graft: Secondary | ICD-10-CM

## 2022-02-18 DIAGNOSIS — R4701 Aphasia: Secondary | ICD-10-CM | POA: Diagnosis present

## 2022-02-18 DIAGNOSIS — Z7982 Long term (current) use of aspirin: Secondary | ICD-10-CM

## 2022-02-18 DIAGNOSIS — A419 Sepsis, unspecified organism: Secondary | ICD-10-CM | POA: Diagnosis not present

## 2022-02-18 DIAGNOSIS — E875 Hyperkalemia: Secondary | ICD-10-CM | POA: Diagnosis present

## 2022-02-18 DIAGNOSIS — E440 Moderate protein-calorie malnutrition: Secondary | ICD-10-CM | POA: Diagnosis present

## 2022-02-18 DIAGNOSIS — R29722 NIHSS score 22: Secondary | ICD-10-CM | POA: Diagnosis present

## 2022-02-18 DIAGNOSIS — J9601 Acute respiratory failure with hypoxia: Secondary | ICD-10-CM

## 2022-02-18 DIAGNOSIS — I608 Other nontraumatic subarachnoid hemorrhage: Secondary | ICD-10-CM | POA: Diagnosis not present

## 2022-02-18 DIAGNOSIS — D696 Thrombocytopenia, unspecified: Secondary | ICD-10-CM | POA: Diagnosis present

## 2022-02-18 DIAGNOSIS — J690 Pneumonitis due to inhalation of food and vomit: Secondary | ICD-10-CM | POA: Diagnosis not present

## 2022-02-18 DIAGNOSIS — I63511 Cerebral infarction due to unspecified occlusion or stenosis of right middle cerebral artery: Secondary | ICD-10-CM | POA: Diagnosis not present

## 2022-02-18 DIAGNOSIS — D649 Anemia, unspecified: Secondary | ICD-10-CM | POA: Diagnosis not present

## 2022-02-18 DIAGNOSIS — I609 Nontraumatic subarachnoid hemorrhage, unspecified: Secondary | ICD-10-CM

## 2022-02-18 DIAGNOSIS — I9788 Other intraoperative complications of the circulatory system, not elsewhere classified: Secondary | ICD-10-CM | POA: Diagnosis not present

## 2022-02-18 DIAGNOSIS — Z950 Presence of cardiac pacemaker: Secondary | ICD-10-CM

## 2022-02-18 DIAGNOSIS — Z6821 Body mass index (BMI) 21.0-21.9, adult: Secondary | ICD-10-CM

## 2022-02-18 DIAGNOSIS — Z8679 Personal history of other diseases of the circulatory system: Secondary | ICD-10-CM

## 2022-02-18 DIAGNOSIS — Z66 Do not resuscitate: Secondary | ICD-10-CM | POA: Diagnosis not present

## 2022-02-18 DIAGNOSIS — E785 Hyperlipidemia, unspecified: Secondary | ICD-10-CM | POA: Diagnosis present

## 2022-02-18 DIAGNOSIS — N390 Urinary tract infection, site not specified: Secondary | ICD-10-CM | POA: Diagnosis not present

## 2022-02-18 DIAGNOSIS — R509 Fever, unspecified: Secondary | ICD-10-CM | POA: Diagnosis not present

## 2022-02-18 DIAGNOSIS — Z7902 Long term (current) use of antithrombotics/antiplatelets: Secondary | ICD-10-CM

## 2022-02-18 DIAGNOSIS — N2889 Other specified disorders of kidney and ureter: Secondary | ICD-10-CM | POA: Diagnosis present

## 2022-02-18 DIAGNOSIS — N179 Acute kidney failure, unspecified: Secondary | ICD-10-CM | POA: Diagnosis present

## 2022-02-18 DIAGNOSIS — J9600 Acute respiratory failure, unspecified whether with hypoxia or hypercapnia: Secondary | ICD-10-CM | POA: Diagnosis not present

## 2022-02-18 DIAGNOSIS — J9602 Acute respiratory failure with hypercapnia: Secondary | ICD-10-CM | POA: Diagnosis not present

## 2022-02-18 DIAGNOSIS — E1165 Type 2 diabetes mellitus with hyperglycemia: Secondary | ICD-10-CM | POA: Diagnosis present

## 2022-02-18 DIAGNOSIS — R29810 Facial weakness: Secondary | ICD-10-CM | POA: Diagnosis present

## 2022-02-18 DIAGNOSIS — J9611 Chronic respiratory failure with hypoxia: Secondary | ICD-10-CM | POA: Diagnosis not present

## 2022-02-18 HISTORY — PX: RADIOLOGY WITH ANESTHESIA: SHX6223

## 2022-02-18 HISTORY — PX: IR ANGIOGRAM FOLLOW UP STUDY: IMG697

## 2022-02-18 HISTORY — PX: IR US GUIDE VASC ACCESS RIGHT: IMG2390

## 2022-02-18 HISTORY — PX: IR PERCUTANEOUS ART THROMBECTOMY/INFUSION INTRACRANIAL INC DIAG ANGIO: IMG6087

## 2022-02-18 LAB — DIFFERENTIAL
Abs Immature Granulocytes: 0.06 10*3/uL (ref 0.00–0.07)
Basophils Absolute: 0 10*3/uL (ref 0.0–0.1)
Basophils Relative: 0 %
Eosinophils Absolute: 0 10*3/uL (ref 0.0–0.5)
Eosinophils Relative: 0 %
Immature Granulocytes: 1 %
Lymphocytes Relative: 11 %
Lymphs Abs: 1.1 10*3/uL (ref 0.7–4.0)
Monocytes Absolute: 0.7 10*3/uL (ref 0.1–1.0)
Monocytes Relative: 8 %
Neutro Abs: 7.8 10*3/uL — ABNORMAL HIGH (ref 1.7–7.7)
Neutrophils Relative %: 80 %

## 2022-02-18 LAB — CBC
HCT: 36.4 % (ref 36.0–46.0)
Hemoglobin: 11.3 g/dL — ABNORMAL LOW (ref 12.0–15.0)
MCH: 28.2 pg (ref 26.0–34.0)
MCHC: 31 g/dL (ref 30.0–36.0)
MCV: 90.8 fL (ref 80.0–100.0)
Platelets: 74 10*3/uL — ABNORMAL LOW (ref 150–400)
RBC: 4.01 MIL/uL (ref 3.87–5.11)
RDW: 15 % (ref 11.5–15.5)
WBC: 9.7 10*3/uL (ref 4.0–10.5)
nRBC: 0 % (ref 0.0–0.2)

## 2022-02-18 LAB — GLUCOSE, CAPILLARY: Glucose-Capillary: 184 mg/dL — ABNORMAL HIGH (ref 70–99)

## 2022-02-18 LAB — I-STAT CHEM 8, ED
BUN: 48 mg/dL — ABNORMAL HIGH (ref 8–23)
Calcium, Ion: 0.95 mmol/L — ABNORMAL LOW (ref 1.15–1.40)
Chloride: 114 mmol/L — ABNORMAL HIGH (ref 98–111)
Creatinine, Ser: 1.3 mg/dL — ABNORMAL HIGH (ref 0.44–1.00)
Glucose, Bld: 158 mg/dL — ABNORMAL HIGH (ref 70–99)
HCT: 33 % — ABNORMAL LOW (ref 36.0–46.0)
Hemoglobin: 11.2 g/dL — ABNORMAL LOW (ref 12.0–15.0)
Potassium: 5.5 mmol/L — ABNORMAL HIGH (ref 3.5–5.1)
Sodium: 140 mmol/L (ref 135–145)
TCO2: 19 mmol/L — ABNORMAL LOW (ref 22–32)

## 2022-02-18 LAB — RESP PANEL BY RT-PCR (RSV, FLU A&B, COVID)  RVPGX2
Influenza A by PCR: NEGATIVE
Influenza B by PCR: NEGATIVE
Resp Syncytial Virus by PCR: NEGATIVE
SARS Coronavirus 2 by RT PCR: NEGATIVE

## 2022-02-18 SURGERY — IR WITH ANESTHESIA
Anesthesia: General

## 2022-02-18 MED ORDER — INSULIN ASPART 100 UNIT/ML IJ SOLN
0.0000 [IU] | INTRAMUSCULAR | Status: DC
  Administered 2022-02-19: 2 [IU] via SUBCUTANEOUS
  Administered 2022-02-19 (×2): 1 [IU] via SUBCUTANEOUS
  Administered 2022-02-19 – 2022-02-20 (×2): 2 [IU] via SUBCUTANEOUS
  Administered 2022-02-20 (×2): 1 [IU] via SUBCUTANEOUS
  Administered 2022-02-20 – 2022-02-21 (×2): 2 [IU] via SUBCUTANEOUS
  Administered 2022-02-22 (×2): 1 [IU] via SUBCUTANEOUS
  Administered 2022-02-22: 3 [IU] via SUBCUTANEOUS
  Administered 2022-02-22 (×2): 2 [IU] via SUBCUTANEOUS
  Administered 2022-02-23: 3 [IU] via SUBCUTANEOUS
  Administered 2022-02-23 – 2022-02-24 (×8): 2 [IU] via SUBCUTANEOUS
  Administered 2022-02-25: 3 [IU] via SUBCUTANEOUS
  Administered 2022-02-25 (×2): 2 [IU] via SUBCUTANEOUS

## 2022-02-18 MED ORDER — SENNOSIDES-DOCUSATE SODIUM 8.6-50 MG PO TABS: 1.0000 | ORAL_TABLET | Freq: Every evening | ORAL | Status: DC | PRN

## 2022-02-18 MED ORDER — CLEVIDIPINE BUTYRATE 0.5 MG/ML IV EMUL
0.0000 mg/h | INTRAVENOUS | Status: DC
  Administered 2022-02-19 (×2): 16 mg/h via INTRAVENOUS
  Administered 2022-02-19: 21 mg/h via INTRAVENOUS
  Administered 2022-02-19: 12 mg/h via INTRAVENOUS
  Administered 2022-02-19: 15 mg/h via INTRAVENOUS
  Administered 2022-02-19: 10 mg/h via INTRAVENOUS
  Administered 2022-02-20: 14 mg/h via INTRAVENOUS
  Administered 2022-02-20: 12 mg/h via INTRAVENOUS
  Filled 2022-02-18 (×8): qty 100

## 2022-02-18 MED ORDER — SUCCINYLCHOLINE CHLORIDE 200 MG/10ML IV SOSY
PREFILLED_SYRINGE | INTRAVENOUS | Status: DC | PRN
  Administered 2022-02-18: 80 mg via INTRAVENOUS

## 2022-02-18 MED ORDER — IOHEXOL 350 MG/ML SOLN
65.0000 mL | Freq: Once | INTRAVENOUS | Status: AC | PRN
  Administered 2022-02-18: 65 mL via INTRAVENOUS

## 2022-02-18 MED ORDER — ENSURE ENLIVE PO LIQD: 237.0000 mL | Freq: Three times a day (TID) | ORAL | Status: DC

## 2022-02-18 MED ORDER — FOLIC ACID 1 MG PO TABS: 1.0000 mg | ORAL_TABLET | Freq: Every day | ORAL | Status: DC

## 2022-02-18 MED ORDER — IOHEXOL 300 MG/ML  SOLN
100.0000 mL | Freq: Once | INTRAMUSCULAR | Status: AC | PRN
  Administered 2022-02-18: 70 mL via INTRA_ARTERIAL

## 2022-02-18 MED ORDER — ORAL CARE MOUTH RINSE: 15.0000 mL | OROMUCOSAL | Status: DC | PRN

## 2022-02-18 MED ORDER — SODIUM CHLORIDE 0.9% FLUSH
3.0000 mL | Freq: Once | INTRAVENOUS | Status: AC
  Administered 2022-02-19: 3 mL via INTRAVENOUS

## 2022-02-18 MED ORDER — IOHEXOL 350 MG/ML SOLN
75.0000 mL | Freq: Once | INTRAVENOUS | Status: AC | PRN
  Administered 2022-02-18: 75 mL via INTRAVENOUS

## 2022-02-18 MED ORDER — SODIUM CHLORIDE 0.9 % IV SOLN: INTRAVENOUS | Status: DC | PRN

## 2022-02-18 MED ORDER — CHLORHEXIDINE GLUCONATE CLOTH 2 % EX PADS
6.0000 | MEDICATED_PAD | Freq: Every day | CUTANEOUS | Status: DC
  Administered 2022-02-18 – 2022-02-23 (×5): 6 via TOPICAL

## 2022-02-18 MED ORDER — PHENYLEPHRINE HCL-NACL 20-0.9 MG/250ML-% IV SOLN
INTRAVENOUS | Status: DC | PRN
  Administered 2022-02-18: 25 ug/min via INTRAVENOUS

## 2022-02-18 MED ORDER — ACETAMINOPHEN 160 MG/5ML PO SOLN: 650.0000 mg | ORAL | Status: DC | PRN

## 2022-02-18 MED ORDER — CLEVIDIPINE BUTYRATE 0.5 MG/ML IV EMUL
INTRAVENOUS | Status: AC
  Filled 2022-02-18: qty 50

## 2022-02-18 MED ORDER — ACETAMINOPHEN 325 MG PO TABS: 650.0000 mg | ORAL_TABLET | ORAL | Status: DC | PRN

## 2022-02-18 MED ORDER — ROSUVASTATIN CALCIUM 20 MG PO TABS: 40.0000 mg | ORAL_TABLET | Freq: Every day | ORAL | Status: DC

## 2022-02-18 MED ORDER — IOHEXOL 350 MG/ML SOLN
40.0000 mL | Freq: Once | INTRAVENOUS | Status: AC | PRN
  Administered 2022-02-18: 40 mL via INTRAVENOUS

## 2022-02-18 MED ORDER — IOHEXOL 300 MG/ML  SOLN
100.0000 mL | Freq: Once | INTRAMUSCULAR | Status: AC | PRN
  Administered 2022-02-18: 40 mL via INTRA_ARTERIAL

## 2022-02-18 MED ORDER — PROPOFOL 500 MG/50ML IV EMUL
INTRAVENOUS | Status: DC | PRN
  Administered 2022-02-18: 50 ug/kg/min via INTRAVENOUS

## 2022-02-18 MED ORDER — LABETALOL HCL 5 MG/ML IV SOLN
INTRAVENOUS | Status: DC | PRN
  Administered 2022-02-18 (×4): 5 mg via INTRAVENOUS

## 2022-02-18 MED ORDER — PROPOFOL 10 MG/ML IV BOLUS
INTRAVENOUS | Status: DC | PRN
  Administered 2022-02-18: 60 mg via INTRAVENOUS

## 2022-02-18 MED ORDER — ACETAMINOPHEN 650 MG RE SUPP: 650.0000 mg | RECTAL | Status: DC | PRN

## 2022-02-18 MED ORDER — PROTAMINE SULFATE 10 MG/ML IV SOLN
INTRAVENOUS | Status: DC | PRN
  Administered 2022-02-18: 30 mg via INTRAVENOUS
  Administered 2022-02-18: 10 mg via INTRAVENOUS

## 2022-02-18 MED ORDER — SODIUM CHLORIDE (PF) 0.9 % IJ SOLN
INTRAVENOUS | Status: DC | PRN
  Administered 2022-02-18: 2 mg
  Administered 2022-02-18: 2 mg via INTRA_ARTERIAL

## 2022-02-18 MED ORDER — CLEVIDIPINE BUTYRATE 0.5 MG/ML IV EMUL
INTRAVENOUS | Status: DC | PRN
  Administered 2022-02-18: 2 mg/h via INTRAVENOUS

## 2022-02-18 MED ORDER — STROKE: EARLY STAGES OF RECOVERY BOOK
Freq: Once | Status: AC
  Filled 2022-02-18: qty 1

## 2022-02-18 MED ORDER — OXYCODONE-ACETAMINOPHEN 5-325 MG PO TABS: 1.0000 | ORAL_TABLET | Freq: Four times a day (QID) | ORAL | Status: DC | PRN

## 2022-02-18 MED ORDER — ENOXAPARIN SODIUM 40 MG/0.4ML IJ SOSY: 40.0000 mg | PREFILLED_SYRINGE | INTRAMUSCULAR | Status: DC

## 2022-02-18 MED ORDER — NITROGLYCERIN 1 MG/10 ML FOR IR/CATH LAB
INTRA_ARTERIAL | Status: AC
  Filled 2022-02-18: qty 10

## 2022-02-18 MED ORDER — ORAL CARE MOUTH RINSE
15.0000 mL | OROMUCOSAL | Status: DC
  Administered 2022-02-19 – 2022-02-21 (×31): 15 mL via OROMUCOSAL

## 2022-02-18 MED ORDER — VITAMIN B-12 1000 MCG PO TABS: 1000.0000 ug | ORAL_TABLET | Freq: Every day | ORAL | Status: DC

## 2022-02-18 MED ORDER — PHENYLEPHRINE HCL (PRESSORS) 10 MG/ML IV SOLN
INTRAVENOUS | Status: DC | PRN
  Administered 2022-02-18: 80 ug via INTRAVENOUS

## 2022-02-18 MED ORDER — PANTOPRAZOLE SODIUM 40 MG PO TBEC: 40.0000 mg | DELAYED_RELEASE_TABLET | Freq: Every day | ORAL | Status: DC

## 2022-02-18 MED ORDER — LIDOCAINE HCL (CARDIAC) PF 100 MG/5ML IV SOSY
PREFILLED_SYRINGE | INTRAVENOUS | Status: DC | PRN
  Administered 2022-02-18: 80 mg via INTRATRACHEAL

## 2022-02-18 MED ORDER — ROCURONIUM BROMIDE 100 MG/10ML IV SOLN
INTRAVENOUS | Status: DC | PRN
  Administered 2022-02-18: 40 mg via INTRAVENOUS
  Administered 2022-02-18: 10 mg via INTRAVENOUS
  Administered 2022-02-18: 40 mg via INTRAVENOUS

## 2022-02-18 MED ORDER — ALBUTEROL SULFATE (2.5 MG/3ML) 0.083% IN NEBU: 3.0000 mL | INHALATION_SOLUTION | Freq: Four times a day (QID) | RESPIRATORY_TRACT | Status: DC | PRN

## 2022-02-18 MED ORDER — LABETALOL HCL 5 MG/ML IV SOLN
10.0000 mg | INTRAVENOUS | Status: DC | PRN
  Filled 2022-02-18: qty 4

## 2022-02-18 MED ORDER — SODIUM CHLORIDE 0.9 % IV SOLN: INTRAVENOUS | Status: AC

## 2022-02-18 NOTE — Transfer of Care (Signed)
Immediate Anesthesia Transfer of Care Note  Patient: Shannon Chen  Procedure(s) Performed: IR WITH ANESTHESIA  Patient Location: SICU  Anesthesia Type:General  Level of Consciousness: sedated, unresponsive, and Patient remains intubated per anesthesia plan  Airway & Oxygen Therapy: Patient remains intubated per anesthesia plan and Patient placed on Ventilator (see vital sign flow sheet for setting)  Post-op Assessment: Report given to RN and Post -op Vital signs reviewed and stable  Post vital signs: Reviewed and stable  Last Vitals:  Vitals Value Taken Time  BP 112/47 02/18/22 2332  Temp 37.1 C 02/18/22 2341  Pulse 78 02/18/22 2347  Resp 18 02/18/22 2347  SpO2 96 % 02/18/22 2347  Vitals shown include unvalidated device data.  Last Pain:  Vitals:   02/18/22 2341  TempSrc: Oral         Complications: No notable events documented.

## 2022-02-18 NOTE — Code Documentation (Addendum)
Stroke Response Nurse Documentation Code Documentation  Shannon Chen is a 77 y.o. female arriving to [facility]  via [facility] on 02/18/22 with past medical hx of Afib, CAD, HLD,HTN . On clopidogrel 75 mg daily. Code stroke was activated by EMS.   Patient from skilled facility where she was LKW at 1815 and now complaining of left sided droop, left sided weakness and aphasia.  Stroke team at the bedside on patient arrival. Labs drawn and patient cleared for CT by Dr. Fuller Plan. Patient to CT with team. NIHSS 18,  see documentation for details and code stroke times. Patient with decreased LOC, disoriented, not following commands, right gaze preference , left hemianopia, left facial droop, left arm weakness, left leg weakness, Global aphasia , dysarthria , and left neglect on exam. The following imaging was completed:  CT Head, CTA, and CTP. Patient is not a candidate for IV Thrombolytic due to thrombocytopenica. Patient is a candidate for IR due to LVO.   Care Plan: Mechanical thrombectomy per IR.   Bedside handoff with IR RN F[facility].    Madelynn Done  Rapid Response RN

## 2022-02-18 NOTE — ED Triage Notes (Addendum)
Pt bib ems as a code stroke. LKN 7619, was eating at table with caregiver when she became unresponsive and slumped over. L weakness, L facial droop and aphasia noted. Pt unable to follow commands or speak. Bilateral 20 AC  T 99.9 HR 112 SPO2 95% RA RR 30 BP 143/48

## 2022-02-18 NOTE — Procedures (Signed)
INTERVENTIONAL NEURORADIOLOGY BRIEF POSTPROCEDURE NOTE  DIAGNOSTIC CEREBRAL ANGIOGRAM MECHANICAL THROMBECTOMY ENDOVASCULAR VESSEL EMBOLIZATION FLAT PANEL HEAD CT  Attending: Dr. Pedro Earls  Diagnosis: Right M2/MCA occlusion.  Access site: Right radial artery  Access closure: TR band  Anesthesia: GETA  Medication used: Refer to anesthesia documentation.  Complications: intracranial M3 branch rupture treated with coil embolization.  Estimated blood loss: 30 mL  Specimen: None.  Findings: Occlusion of a dominant right M2/MCA. Severe diffuse intracranial atherosclerotic disease. Mechanical thrombectomy performed with stent retriever achieving partial recanalization (TICI2B). Procedure complicated by vessel rupture after minimal manipulation requiring vessel sacrifice with coil embolization.Post embolization angiogram showed occlusion of the supraclinoid right ICA. One pass direct contact aspiration performed achieving complete recanalization of the ICA. MCA remained TICI 2A related to branch embolization; this score is similar to start of procedure.  The patient remained intubated and was transferred to ICU.   PLAN: - Pt intubated due to mental status and SAH - SBP 100-140 mmHg

## 2022-02-18 NOTE — Anesthesia Procedure Notes (Signed)
Procedure Name: Intubation Date/Time: 02/18/2022 9:08 PM  Performed by: Suzy Bouchard, CRNAPre-anesthesia Checklist: Patient identified, Emergency Drugs available, Suction available, Patient being monitored and Timeout performed Patient Re-evaluated:Patient Re-evaluated prior to induction Oxygen Delivery Method: Circle system utilized Preoxygenation: Pre-oxygenation with 100% oxygen Induction Type: IV induction and Rapid sequence Laryngoscope Size: Glidescope and 3 Grade View: Grade I Tube type: Oral Tube size: 7.0 mm Number of attempts: 1 Airway Equipment and Method: Stylet and Video-laryngoscopy Placement Confirmation: ETT inserted through vocal cords under direct vision, positive ETCO2 and breath sounds checked- equal and bilateral Secured at: 23 cm Tube secured with: Tape Dental Injury: Teeth and Oropharynx as per pre-operative assessment

## 2022-02-18 NOTE — Anesthesia Procedure Notes (Signed)
Arterial Line Insertion Start/End1/03/2022 9:15 PM, 02/18/2022 9:15 PM Performed by: Flowers, Rokoshi T, Immunologist, CRNA  Emergency situation Left, radial was placed Catheter size: 20 G Hand hygiene performed   Attempts: 1 Procedure performed without using ultrasound guided technique. Following insertion, dressing applied and Biopatch. Post procedure assessment: normal  Patient tolerated the procedure well with no immediate complications.

## 2022-02-18 NOTE — Anesthesia Preprocedure Evaluation (Signed)
Anesthesia Evaluation  Patient identified by MRN, date of birth, ID band Patient unresponsive    Reviewed: Allergy & Precautions, Patient's Chart, lab work & pertinent test results, Unable to perform ROS - Chart review onlyPreop documentation limited or incomplete due to emergent nature of procedure.  History of Anesthesia Complications Negative for: history of anesthetic complications  Airway Mallampati: Unable to assess       Dental  (+) Poor Dentition   Pulmonary COPD, former smoker   breath sounds clear to auscultation       Cardiovascular hypertension, + CAD, + Past MI and + Peripheral Vascular Disease (carotid stenosis s/p CEA on Plavix; common iliac artery aneurysm)  + dysrhythmias (Mobitz II AVB) + pacemaker  Rhythm:Regular Rate:Tachycardia  HOCM, preserved EF  ECG 12/10/21: NSR, RBBB, new TWI, no STEMI  Cardiac catheterization for elevated troponin October 2022: Severely calcified and tortuous right coronary artery 70% stenosis, unable to pass catheter, small in nature and tortuous vessel, medical management recommended  Repeat catheterization November 2022 for altered mental status and elevated troponin, findings showing stable coronary disease Attempt to PCI RCA unsuccessful, medical management recommended  Echo 12/2020  1. Left ventricular ejection fraction, by estimation, is 60 to 65%. The  left ventricle has normal function. The left ventricle has no regional  wall motion abnormalities. There is severe left ventricular hypertrophy of  the basal-septal segment. Left  ventricular diastolic parameters are consistent with Grade I diastolic  dysfunction (impaired relaxation). Elevated left ventricular end-diastolic  pressure.   2. Right ventricular systolic function is normal. The right ventricular  size is normal.   3. The mitral valve is normal in structure. Trivial mitral valve  regurgitation. No evidence of  mitral stenosis.   4. The aortic valve is calcified. Aortic valve regurgitation is not  visualized. Mild aortic valve stenosis. Aortic valve area, by VTI measures  1.45 cm. Aortic valve mean gradient measures 11.5 mmHg. Aortic valve Vmax  measures 2.45 m/s.     Neuro/Psych CVA (multiple)    GI/Hepatic negative GI ROS,,,  Endo/Other  diabetes, Type 2    Renal/GU Renal disease (AKI on CKD)     Musculoskeletal  (+) Arthritis ,  Rhabdomyolysis     Abdominal   Peds  Hematology negative hematology ROS (+)   Anesthesia Other Findings Inpatient cardiology note 12/13/21:  A/P: Preop cardiovascular evaluation No anginal symptoms, relatively recent cardiac catheterizations not amenable to intervention, medical management has been previously recommended No unstable anginal symptoms, Acceptable risk for vascular surgery History of LVH, would continue beta-blocker perioperatively Would continue Plavix per vascular No additional cardiac testing needed at this time History of high degree heart block, status post pacemaker followed by EP, device functioning well -Given debility at baseline, underlying coronary disease, diffuse PAD, overall high risk for PV procedure and surgery on renal nodule   Outpatient cardiology note 11/12/21:  Assessment and  Plan  Mobitz 2 AVB  Pacemaker--[employer]tronic ParaHisian   Abnormal ECG-dynamic T wave changes  Syncope  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  Life is much better. Blood pressures have been much better.  Continue the amlodipine 2.5 and metoprolol 50.  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  No chest pain.  On rosuvastatin and aspirin.  Last creatinine  had gone up  about 25%.  We will recheck.  She has no cardiac contraindications to having her tooth extracted.  Right bundle branch block is stable  Normal device function.  30% ventricular pacing.  No interval syncope.  Reproductive/Obstetrics                             Anesthesia Physical Anesthesia Plan  ASA: 4 and emergent  Anesthesia Plan: General   Post-op Pain Management: Minimal or no pain anticipated   Induction: Intravenous  PONV Risk Score and Plan: 3 and Ondansetron and Treatment may vary due to age or medical condition  Airway Management Planned: Oral ETT  Additional Equipment: Arterial line  Intra-op Plan:   Post-operative Plan: Possible Post-op intubation/ventilation  Informed Consent: I have reviewed the patients History and Physical, chart, labs and discussed the procedure including the risks, benefits and alternatives for the proposed anesthesia with the patient or authorized representative who has indicated his/her understanding and acceptance.     Dental advisory given  Plan Discussed with: CRNA  Anesthesia Plan Comments:         Anesthesia Quick Evaluation

## 2022-02-18 NOTE — H&P (Addendum)
NEUROLOGY CONSULTATION NOTE   Date of service: February 18, 2022 Patient Name: Shannon Chen MRN:  [id number] DOB:  03-14-1945  _ _ _   _ __   _ __ _ _  __ __   _ __   __ _  History of Present Illness  Shannon Chen is a 77 y.o. 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  Patient lives in a group home and I spoke to caretaker to get history.  Patient and caretaker were discussing options for dinner at 1800.  Caretaker left an 1815 and walked back to see patient slumped over and not talking.  She was worried about a stroke and called EMS.  On arrival, EMS noted left-sided weakness, left facial droop, aphasia and right gaze deviation and activated code stroke.  Patient's caretaker reports that over the last couple days, patient has been reporting some generalized weakness.  She was able to walk with a walker today around noon and had a sandwich.  Since her discharge from the hospital last month, patient has been requiring assistance with dressing and undressing herself, bathing, meal preparation, finances.  Patient is able to usually get up and walk short distances with a walker and communicate and watches TV.  LKW: 3875 on 02/18/2022. mRS: 4 tNKASE: tnkase was not offered immediately given labs demonstrated downtrending platelets with last platelet count of 96 in early nov 2023 and patient noted to have a widened pulse pressure with significant history of common iliac aneurysms and penetrating aortic atherosclerotic ulcers. Initially discussed with patient's niece Ms. Lorenza [place] about tnkase pending platelet count and CT dissection study. However, platelet count resulted at 74 and thus tnkase was not  offered. Thrombectomy: Yes, offered. CT angio demonstrated occlusion of right M2 superior division. Felt that the M2 occlusion may not explain a high NIHSS of 18. CT perfusion obtained which demonstrated 18ml core with 37ml of penumbra. Dr. Margarita Sermons discussed details, risks and benefits of thrombectomy along with alternatives with patient's niece Ms. Sherri [phone] over phone and Ms. Buie consented to thrombectomy. Delay to thrombectomy: complexity of her presentation, requiring a 18g IV to get CT perfusion, evaluating for dissection and waiting for platelet count to result ended up causing significant delay to making tnkase decision and delaying IR activation.  There was also some concern about accuracy of the provided LKW. Patient ws noted by care taker to be slow over the last day and off from her baseline and thus instead of going for IR activation right away after CT angio, we opted to get a CT Perfusion to clarify territory at risk. NIHSS components Score: Comment  1a Level of Conscious 0[x]  1[]  2[]  3[]      1b LOC Questions 0[]  1[]  2[x]       1c LOC Commands 0[]  1[]  2[x]       2 Best Gaze 0[]  1[x]  2[]       3 Visual 0[]  1[]  2[x]  3[]      4 Facial Palsy 0[]  1[x]  2[]  3[]      5a Motor Arm - left 0[]  1[x]  2[]  3[]  4[]  UN[]    5b Motor Arm - Right 0[x]  1[]  2[]  3[]  4[]  UN[]    6a Motor Leg - Left 0[]  1[]  2[x]  3[]  4[]  UN[]    6b Motor Leg -  Right 0[]  1[x]  2[]  3[]  4[]  UN[]    7 Limb Ataxia 0[x]  1[]  2[]  3[]  UN[]     8 Sensory 0[]  1[x]  2[]  UN[]      9 Best Language 0[]  1[]  2[]  3[x]      10 Dysarthria 0[]  1[]  2[x]  UN[]      11 Extinct. and Inattention 0[x]  1[]  2[]       TOTAL: 18     ROS   Unable to obtain detailed review of systems secondary to aphasia.  Past History   Past Medical History:  Diagnosis Date   Arthritis    AV block, [place] II    a. 09/2016 s/p MDT [id number] Azure XT DR MRI DC PPM   CAD (coronary artery disease)    a. 11/2020 NSTEMI/Cath: LM mild diff dzs, LAD, diff  dzs, LCX 30d, RCA Ca2+ w/ diff dzs throughout, 66m. FFR cath not able to advance-->Med Rx.   Carotid artery disease ([place])    a. 09/2016 Carotid U/S: RICA [id number], LICA [id number];  b. [id number] CTA Neck: RICA 60, RCCA 30, LICA [id number], L Vert 60, L Basilar 70; c. 10/2019 s/p R CEA.   Essential hypertension    History of stress test    a. 12/2016 MV: EF 51%, no ischemia/infarct; b. 10/2019 MV: EF 61%, no ischemia/infarct.   Hyperlipidemia    Hypertrophic cardiomyopathy ([place])    a. 09/2016 Echo: EF 60-65%, no rwma, Gr1 DD, Ca2+ MV annulus; b. 11/2017 Echo: EF 70-75%, no rwma, Gr1 DD, mild AS vs dynamic LVOT obs. Nl RV fxn. Hyperdynamic LVEF w/ sev LVH; c. 10/2019 Echo: EF 70-75%, no rwma, gr1 DD, sev LVH, Gr1 DD, nl PASP, triv MR, mild-mod Ao sclerosis; c. 05/2020 Echo: EF 60-65%; e. 11/2020 Echo: EF 60-65%, sev LVH, nl RV fxn, Mild MR. Ao Sclerosis.   Presence of permanent cardiac pacemaker    Past Surgical History:  Procedure Laterality Date   ABDOMINAL HYSTERECTOMY     CORONARY STENT INTERVENTION N/A 12/31/2020   Procedure: CORONARY STENT INTERVENTION;  Surgeon: Wellington Hampshire, MD;  Location: [facility];  Service: Cardiovascular;  Laterality: N/A;   ENDARTERECTOMY Right 11/16/2019   Procedure: ENDARTERECTOMY CAROTID;  Surgeon: Katha Cabal, MD;  Location: [facility];  Service: Vascular;  Laterality: Right;   ENDOVASCULAR REPAIR/STENT GRAFT N/A 12/18/2021   Procedure: ENDOVASCULAR REPAIR/STENT GRAFT;  Surgeon: Katha Cabal, MD;  Location: [facility];  Service: Cardiovascular;  Laterality: N/A;   INTRAVASCULAR PRESSURE WIRE/FFR STUDY N/A 12/13/2020   Procedure: INTRAVASCULAR PRESSURE WIRE/FFR STUDY;  Surgeon: Leonie Man, MD;  Location: [facility];  Service: Cardiovascular;  Laterality: N/A;   LEFT HEART CATH AND CORONARY ANGIOGRAPHY N/A 12/13/2020   Procedure: LEFT HEART CATH AND CORONARY ANGIOGRAPHY;  Surgeon: Leonie Man, MD;  Location: [facility];   Service: Cardiovascular;  Laterality: N/A;   LEFT HEART CATH AND CORONARY ANGIOGRAPHY N/A 12/31/2020   Procedure: LEFT HEART CATH AND CORONARY ANGIOGRAPHY;  Surgeon: Wellington Hampshire, MD;  Location: [facility];  Service: Cardiovascular;  Laterality: N/A;   MUSCLE BIOPSY Left 12/16/2021   Procedure: MUSCLE BIOPSY;  Surgeon: Ronny Bacon, MD;  Location: [facility];  Service: General;  Laterality: Left;   PACEMAKER IMPLANT N/A 10/08/2016   Procedure: Pacemaker Implant;  Surgeon: Evans Lance, MD;  Location: [facility];  Service: Cardiovascular;  Laterality: N/A;   Family History  Problem Relation Age of Onset   Diabetes Mother    CAD Father  Social History   Socioeconomic History   Marital status: Single    Spouse name: Not on file   Number of children: Not on file   Years of education: Not on file   Highest education level: Not on file  Occupational History   Not on file  Tobacco Use   Smoking status: Former    Packs/day: 1.00    Years: 30.00    Total pack years: 30.00    Types: Cigarettes    Quit date: 11/09/2018    Years since quitting: 3.2    Passive exposure: Past   Smokeless tobacco: Never  Vaping Use   Vaping Use: Never used  Substance and Sexual Activity   Alcohol use: No   Drug use: No   Sexual activity: Not on file  Other Topics Concern   Not on file  Social History Narrative   ** Merged History Encounter **       Independent at baseline Ambulates without any assistance. Lives by herself   Social Determinants of Health   Financial Resource Strain: Not on file  Food Insecurity: No Food Insecurity (12/10/2021)   Hunger Vital Sign    Worried About Running Out of Food in the Last Year: Never true    Ran Out of Food in the Last Year: Never true  Transportation Needs: No Transportation Needs (12/10/2021)   PRAPARE - Hydrologist (Medical): No    Lack of Transportation (Non-Medical): No  Physical Activity:  Not on file  Stress: Not on file  Social Connections: Not on file   No Known Allergies  Medications  (Not in a hospital admission)    Vitals   Vitals:   02/18/22 1900  Weight: 61.8 kg     Body mass index is 22.33 kg/m.  Physical Exam   General: Laying comfortably in bed; in no acute distress.  HENT: Normal oropharynx and mucosa. Normal external appearance of ears and nose.  Neck: Supple, no pain or tenderness  CV: No JVD. No peripheral edema.  Pulmonary: Symmetric Chest rise. Normal respiratory effort.  Abdomen: Soft to touch, non-tender.  Ext: No cyanosis, edema, or deformity  Skin: No rash. Normal palpation of skin.   Musculoskeletal: Normal digits and nails by inspection. No clubbing.   Neurologic Examination  Mental status/Cognition: Alert, makes eye contact on the right.  Does not answer any orientation questions.   Speech/language: Mute, unable to comprehend or repeat.  May be attempts to intermittently mimic/follow verbal cues and was able to squeeze my fingers. Cranial nerves:   CN II Pupils equal and reactive to light, inconsistent blink to threat on the left.   CN III,IV,VI Right gaze deviation, may be slightly crosses midline.   CN V normal sensation in V1, V2, and V3 segments bilaterally   CN VII L facial droop   CN VIII Does not turn head to speech on the left but does make eye contact on speech on the right.   CN IX & X Seems to be protecting her airway.   CN XI Prefers to keep her head on the right.   CN XII midline tongue but does not protrude on command.   Motor:  Muscle bulk: poor, tone normal Unable to do detailed strength testing secondary to aphasia. Holds right upper extremity up and off the bed for more than 10 seconds without any drift. Left upper extremity drifts down when held up off the bed. Brief antigravity movement noted in left lower  extremity. Right lower extremity drifts down when held up and off the bed.  Sensation:  Light touch     Pin prick Grimaces and localizes to pinch in all extremities.   Temperature    Vibration   Proprioception    Coordination/Complex Motor:  Unable to assess. Gait: Deferred for patient's safety.  Labs   CBC:  Recent Labs  Lab 02/18/22 1940 02/18/22 2002  WBC 9.7  --   NEUTROABS 7.8*  --   HGB 11.3* 11.2*  HCT 36.4 33.0*  MCV 90.8  --   PLT 74*  --     Basic Metabolic Panel:  Lab Results  Component Value Date   NA 140 02/18/2022   K 5.5 (H) 02/18/2022   CO2 25 12/20/2021   GLUCOSE 158 (H) 02/18/2022   BUN 48 (H) 02/18/2022   CREATININE 1.30 (H) 02/18/2022   CALCIUM 7.8 (L) 12/20/2021   GFRNONAA >60 12/20/2021   GFRAA >60 11/18/2019   Lipid Panel:  Lab Results  Component Value Date   LDLCALC 57 12/30/2020   HgbA1c:  Lab Results  Component Value Date   HGBA1C 6.4 (H) 12/09/2021   Urine Drug Screen:     Component Value Date/Time   LABOPIA NONE DETECTED 04/24/2018 1600   COCAINSCRNUR NONE DETECTED 04/24/2018 1600   LABBENZ NONE DETECTED 04/24/2018 1600   AMPHETMU NONE DETECTED 04/24/2018 1600   THCU NONE DETECTED 04/24/2018 1600   LABBARB NONE DETECTED 04/24/2018 1600    Alcohol Level     Component Value Date/Time   ETH <10 04/24/2018 1526    CT Head without contrast(Personally reviewed): CTH was negative for a large hypodensity concerning for a large territory infarct or hyperdensity concerning for an ICH  CT angio Head and Neck with contrast(Personally reviewed): Right MCA M2 occlusion  CT perfusion: Core 17ml, penumbra 31ml. I suspect that this is an over-estimation of penumbra on the scan. ASPECTs is 10 on CT Head.  MRI Brain(Personally reviewed): Pending  Impression   Shannon Chen is a 77 y.o. 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 Found to have a right MCA M2 occlusion. Not offered tnkase due to thrombocytopenia, taken to IR for thrombectomy.  Primary Diagnosis:  Cerebral infarction due to embolism of  right middle cerebral artery.   Secondary Diagnosis: Essential (primary) hypertension, Type 2 diabetes mellitus with hyperglycemia , and Hyperkalemia   Recommendations   Acute Right MCA stroke with right MCA M2 occlusion undergoing thrombectomy: - Frequent Neuro checks per stroke unit protocol - Recommend brain imaging with MRI Brain without contrast - Recommend obtaining TTE - Recommend obtaining Lipid panel with LDL - Please start statin if LDL > 70 - Recommend HbA1c - Antithrombotic -Per neuro IR for the first 24 hours after intervention. - Recommend DVT ppx - SBP goal -Per neuro IR for the first 24 hours after intervention. - Recommend Telemetry monitoring for arrythmia - Recommend bedside swallow screen prior to PO intake. - Stroke education booklet - Recommend PT/OT/SLP consult  Hypertension -Hold home medication, goal blood pressure per neuro IR for the first 24 hours after intervention.   Hypertrophic cardiomyopathy -Monitor volume.   Type 2 diabetes mellitus with hyperlipidemia -Hemoglobin A1c is pending. -Sliding scale insulin.   Chronic obstructive pulmonary disease --cont daily bronchodilators  CODE STATUS: Full code and this was discussed with patient's niece over the phone. Allergies: No known allergies.  This patient is critically ill and at significant risk of neurological worsening, death and care requires constant monitoring of vital signs, hemodynamics,respiratory and cardiac monitoring, neurological assessment, discussion with family, other specialists and medical decision making of high complexity. I spent 110 minutes of neurocritical care time  in the care of  this patient. This was time  spent independent of any time provided by nurse practitioner or PA.  Donnetta Simpers [facility] Pager Number [phone] 02/18/2022  9:19 PM  ______________________________________________________________________   Thank you for the opportunity to take part in the care of this patient. If you have any further questions, please contact the neurology consultation attending.  Signed,  [place] Pager Number [phone] _ _ _   _ __   _ __ _ _  __ __   _ __   __ _

## 2022-02-18 NOTE — Sedation Documentation (Signed)
Bed control called regarding patient placement post procedure. No new orders at this time.

## 2022-02-18 NOTE — ED Provider Notes (Signed)
[facility] EMERGENCY DEPARTMENT Provider Note   CSN: [id number] Arrival date & time: 02/18/22  1937     History  Chief Complaint  Patient presents with   Code Stroke    Shannon Chen is a 77 y.o. female.  HPI 77 year old female presents as a code stroke.  She was at her group home and talking when all of a sudden staff noticed that she stopped talking and had deficits.  Last known well at 6:15 PM.  Has left-sided weakness and right-sided gaze.  EMS reports some mild tachycardia and normal blood pressures but no hypertension.  They gave a small amount of fluid.  Otherwise, history is very limited as the patient is not speaking.  Home Medications Prior to Admission medications   Medication Sig Start Date End Date Taking? Authorizing Provider  acetaminophen (TYLENOL) 325 MG tablet Take 650 mg by mouth every 4 (four) hours as needed for mild pain or moderate pain.    [provider]  albuterol (VENTOLIN HFA) 108 (90 Base) MCG/ACT inhaler Inhale 1-2 puffs into the lungs every 6 (six) hours as needed for wheezing or shortness of breath. 11/13/20   [provider]  alum & mag hydroxide-simeth (MAALOX/MYLANTA) 200-200-20 MG/5ML suspension Take 15 mLs by mouth every 6 (six) hours as needed for flatulence. 12/23/21   Enedina Finner, MD  amLODipine (NORVASC) 2.5 MG tablet Take 2.5 mg by mouth daily. 01/08/21   Antonieta Iba, MD  aspirin EC 81 MG EC tablet Take 1 tablet (81 mg total) by mouth daily. Swallow whole. 11/09/19   Wouk, Wilfred Curtis, MD  calcium carbonate (OS-CAL - DOSED IN MG OF ELEMENTAL CALCIUM) 1250 (500 Ca) MG tablet Take 1 tablet (1,250 mg total) by mouth 2 (two) times daily with a meal. 12/23/21   Enedina Finner, MD  cephALEXin (KEFLEX) 500 MG capsule Take 500 mg by mouth 2 (two) times daily. 01/15/22   [provider]  clopidogrel (PLAVIX) 75 MG tablet Take 75 mg by mouth daily. 03/29/21   [provider]  cyanocobalamin 1000 MCG  tablet Take 1 tablet (1,000 mcg total) by mouth daily. 12/24/21   Enedina Finner, MD  dapagliflozin propanediol (FARXIGA) 10 MG TABS tablet Take 10 mg by mouth daily.    [provider]  docusate sodium (COLACE) 100 MG capsule Take 100 mg by mouth 2 (two) times daily.    [provider]  feeding supplement (ENSURE ENLIVE / ENSURE PLUS) LIQD Take 237 mLs by mouth 3 (three) times daily between meals. 12/23/21   Enedina Finner, MD  folic acid (FOLVITE) 1 MG tablet Take 1 tablet (1 mg total) by mouth daily. 12/24/21   Enedina Finner, MD  KERENDIA 20 MG TABS Take 20 mg by mouth daily. 12/04/21   [provider]  loperamide (IMODIUM) 2 MG capsule Take 2 mg by mouth as needed. 04/17/21   [provider]  meclizine (ANTIVERT) 12.5 MG tablet Take 0.5 mg by mouth as needed for dizziness.    [provider]  metoprolol succinate (TOPROL-XL) 50 MG 24 hr tablet Take 1 tablet (50 mg total) by mouth daily. 12/16/20 12/09/21  Gillis Santa, MD  mirtazapine (REMERON) 7.5 MG tablet Take 7.5 mg by mouth at bedtime. 12/04/20   [provider]  Multiple Vitamin (MULTIVITAMIN WITH MINERALS) TABS tablet Take 1 tablet by mouth daily. 12/24/21   Enedina Finner, MD  oxyCODONE-acetaminophen (PERCOCET/ROXICET) 5-325 MG tablet Take 1 tablet by mouth every 6 (six) hours as  needed for moderate pain. 12/23/21   Enedina FinnerPatel, Sona, MD  pantoprazole (PROTONIX) 40 MG tablet Take 1 tablet (40 mg total) by mouth daily. 12/24/21   Enedina FinnerPatel, Sona, MD  polyethylene glycol (MIRALAX) 17 g packet Take 17 g by mouth daily as needed for mild constipation. 11/28/19   Alver SorrowWalker, Caitlin S, NP  potassium chloride (KLOR-CON) 10 MEQ tablet Take 10 mEq by mouth daily. 03/29/21   [provider]  predniSONE (DELTASONE) 20 MG tablet Take 3 tablets (60 mg total) by mouth daily with breakfast. Take it till you are seen by Rheumatology 12/24/21   Enedina FinnerPatel, Sona, MD  rosuvastatin (CRESTOR) 40 MG tablet TAKE 1 TABLET BY MOUTH DAILY  12/04/20   Antonieta IbaGollan, Timothy J, MD  TRELEGY ELLIPTA 200-62.5-25 MCG/ACT AEPB Inhale 1 puff into the lungs daily. 12/07/20   [provider]  Vibegron (GEMTESA) 75 MG TABS Take 75 mg by mouth daily.    [provider]  Vitamin D, Ergocalciferol, (DRISDOL) 1.25 MG (50000 UNIT) CAPS capsule Take 1 capsule (50,000 Units total) by mouth every 7 (seven) days. 12/23/21   Enedina FinnerPatel, Sona, MD      Allergies    Patient has no known allergies.    Review of Systems   Review of Systems  Unable to perform ROS: Mental status change    Physical Exam Updated Vital Signs BP (!) 131/57   Pulse (!) 111   Temp 99.4 F (37.4 C) (Oral)   Resp 15   Wt 61.8 kg   SpO2 100%   BMI 22.33 kg/m  Physical Exam Vitals and nursing note reviewed.  Constitutional:      Appearance: She is well-developed.  HENT:     Head: Normocephalic and atraumatic.  Pulmonary:     Effort: Pulmonary effort is normal.  Abdominal:     General: There is no distension.  Skin:    General: Skin is warm and dry.  Neurological:     Mental Status: She is alert.     Comments: Right sided gaze deviation, does not go past midline. Right sided strength intact, but 2/5 strength LUE, LLE     ED Results / Procedures / Treatments   Labs (all labs ordered are listed, but only abnormal results are displayed) Labs Reviewed  CBC - Abnormal; Notable for the following components:      Result Value   Hemoglobin 11.3 (*)    Platelets 74 (*)    All other components within normal limits  DIFFERENTIAL - Abnormal; Notable for the following components:   Neutro Abs 7.8 (*)    All other components within normal limits  I-STAT CHEM 8, ED - Abnormal; Notable for the following components:   Potassium 5.5 (*)    Chloride 114 (*)    BUN 48 (*)    Creatinine, Ser 1.30 (*)    Glucose, Bld 158 (*)    Calcium, Ion 0.95 (*)    TCO2 19 (*)    Hemoglobin 11.2 (*)    HCT 33.0 (*)    All other components within normal limits  RESP PANEL  BY RT-PCR (RSV, FLU A&B, COVID)  RVPGX2  ETHANOL  PROTIME-INR  APTT  COMPREHENSIVE METABOLIC PANEL  LIPID PANEL  CREATININE, SERUM  CBC  HEMOGLOBIN A1C  CBG MONITORING, ED    EKG None  Radiology CT CEREBRAL PERFUSION W CONTRAST  Result Date: 02/18/2022 CLINICAL DATA:  Left facial droop EXAM: CT PERFUSION BRAIN TECHNIQUE: Multiphase CT imaging of the brain was performed following IV  bolus contrast injection. Subsequent parametric perfusion maps were calculated using RAPID software. RADIATION DOSE REDUCTION: This exam was performed according to the departmental dose-optimization program which includes automated exposure control, adjustment of the mA and/or kV according to patient size and/or use of iterative reconstruction technique. CONTRAST:  22mL OMNIPAQUE IOHEXOL 350 MG/ML SOLN COMPARISON:  None Available. FINDINGS: CT Brain Perfusion Findings: CBF (<30%) Volume: 27mL Perfusion (Tmax>6.0s) volume: 31mL Mismatch Volume: 61mL ASPECTS on noncontrast CT Head: 10 at 7:47 p.m. today. Infarct Core: 32 mL Infarction Location:Anterior right MCA territory IMPRESSION: A 32 mL core infarct with 5 mL penumbra. Electronically Signed   By: Ulyses Jarred M.D.   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 EXAM: CT ANGIOGRAPHY CHEST, ABDOMEN AND PELVIS TECHNIQUE: Non-contrast CT of the chest was initially obtained. Multidetector CT imaging through the chest, abdomen and pelvis was performed using the standard protocol during bolus administration of intravenous contrast. Multiplanar reconstructed images and MIPs were obtained and reviewed to evaluate the vascular anatomy. RADIATION DOSE REDUCTION: This exam was performed according to the departmental dose-optimization program which includes automated exposure control, adjustment of the mA and/or kV according to patient size and/or use of  iterative reconstruction technique. CONTRAST:  40mL OMNIPAQUE IOHEXOL 350 MG/ML SOLN COMPARISON:  December 15, 2021 FINDINGS: CTA CHEST FINDINGS Cardiovascular: A dual lead AICD is noted. There is marked severity calcification of the thoracic aorta. The distal aspect of the descending thoracic aorta is tortuous in appearance and measures approximately 3.1 cm x 2.8 cm. Satisfactory opacification of the pulmonary arteries to the segmental level. No evidence of pulmonary embolism. Normal heart size with marked severity coronary artery calcification. No pericardial effusion. Mediastinum/Nodes: No enlarged mediastinal, hilar, or axillary lymph nodes. Thyroid gland, trachea, and esophagus demonstrate no significant findings. Lungs/Pleura: Mild, bilateral atelectatic changes are seen without evidence of acute infiltrate, pleural effusion or pneumothorax. Musculoskeletal: No chest wall abnormality. No acute or significant osseous findings. Review of the MIP images confirms the above findings. CTA ABDOMEN AND PELVIS FINDINGS VASCULAR Aorta: Patent aortoiliac stent graft, without evidence of stent occlusion or stent leak. Celiac: Marked severity calcification without evidence of aneurysm, dissection, vasculitis or significant stenosis. SMA: Marked severity calcification without evidence of aneurysm, dissection, vasculitis or significant stenosis. Renals: Both renal arteries are patent without evidence of aneurysm, dissection, vasculitis, fibromuscular dysplasia or significant stenosis. IMA: Patent without evidence of aneurysm, dissection, vasculitis or significant stenosis. 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 Veins: No obvious venous abnormality within the limitations of this arterial phase study. Review of the MIP images confirms the above findings. NON-VASCULAR Hepatobiliary: No  focal liver abnormality is seen. No gallstones or gallbladder wall thickening. The common bile duct measures 11.6 mm in diameter. Pancreas: There is mild pancreatic ductal dilatation without evidence of surrounding inflammatory changes. Spleen: A 3.1 cm x 2.1 cm cystic appearing area is seen within the anterior aspect of the spleen. Adrenals/Urinary Tract: There is diffuse bilateral adrenal gland enlargement, left greater than right. Kidneys are normal in size, without renal calculi or hydronephrosis. An 8 mm diameter simple cyst is seen within the anterolateral aspect of the mid left kidney. Bladder is unremarkable. A 4.8 cm x 4.0 cm cystic appearing structure is seen adjacent to the anterolateral aspect of the  urinary bladder on the left. This represents a new finding when compared to the prior study. Stomach/Bowel: Stomach is within normal limits. Appendix appears normal. No evidence of bowel wall thickening, distention, or inflammatory changes. Noninflamed diverticula are seen throughout the descending and sigmoid colon. Lymphatic: No abnormal abdominal or pelvic lymph nodes are identified. Reproductive: Status post hysterectomy. Other: A 2.1 cm x 1.4 cm fat containing ventral hernia is noted along the mid abdomen. No abdominopelvic ascites. Musculoskeletal: Multilevel degenerative changes seen throughout the lumbar spine. Review of the MIP images confirms the above findings. IMPRESSION: 1. No evidence of pulmonary embolism or other acute intrathoracic process. 2. Patent aortoiliac stent graft without evidence of stent occlusion or stent leak. 3. 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 4. Colonic diverticulosis. 5. Cystic appearing area adjacent to the anterolateral aspect of the urinary bladder on the left, which represents a new finding when compared to the prior study. This may  represent a large bladder diverticulum. 6. Stable splenic cyst. 7. Small fat containing ventral hernia along the mid abdomen. 8. Aortic atherosclerosis. Aortic Atherosclerosis (ICD10-I70.0). Electronically Signed   By: Aram Candela M.D.   On: 02/18/2022 20:25   CT HEAD CODE STROKE WO CONTRAST  Result Date: 02/18/2022 CLINICAL DATA:  Left facial droop and right gaze deviation EXAM: CT HEAD WITHOUT CONTRAST CT ANGIOGRAPHY OF THE HEAD AND NECK TECHNIQUE: Contiguous axial images were obtained from the base of the skull through the vertex without intravenous contrast. Multidetector CT imaging of the head and neck was performed using the standard protocol during bolus administration of intravenous contrast. Multiplanar CT image reconstructions and MIPs were obtained to evaluate the vascular anatomy. Carotid stenosis measurements (when applicable) are obtained utilizing NASCET criteria, using the distal internal carotid diameter as the denominator. RADIATION DOSE REDUCTION: This exam was performed according to the departmental dose-optimization program which includes automated exposure control, adjustment of the mA and/or kV according to patient size and/or use of iterative reconstruction technique. CONTRAST:  88mL OMNIPAQUE IOHEXOL 350 MG/ML SOLN COMPARISON:  12/09/2021 head CT FINDINGS: CT HEAD Brain: No acute intracranial hemorrhage. 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 There is periventricular hypoattenuation compatible with chronic microvascular disease. Generalized volume loss. Vascular: Atherosclerosis of the internal carotid arteries at the skull base. Skull: Normal. Sinuses/Orbits: Bilateral posterior ethmoid air cell mucosal thickening. Orbits are normal. ASPECTS (Alberta Stroke Program Early CT Score) - Ganglionic level infarction (caudate, lentiform nuclei, internal capsule, insula, M1-M3 cortex): 7 - Supraganglionic infarction (M4-M6 cortex): 3 Total score (0-10 with 10 being  normal): 10 Review of the MIP images confirms the above findings CTA NECK Aortic arch: Aortic atherosclerosis.  Normal branching pattern. Right carotid system: Atherosclerosis throughout the course of the right common carotid artery. The bifurcation is widely patent. No hemodynamically significant stenosis of the right ICA. Left carotid system: Common carotid atherosclerosis without stenosis. Atherosclerotic calcification at the bifurcation extending into the ICA resulting in 60% stenosis. The distal ICA is widely patent. Vertebral arteries:Left dominant. Multifocal atherosclerosis along the course of both vertebral arteries resulting in mild stenosis of the left mid V2 segment head multifocal severe stenosis of the right P2 segment. Both vertebral arteries remain patent to the skull base. Skeleton: Negative Other neck: Unremarkable. Upper chest: Clear CTA HEAD Anterior circulation: There is calcific atherosclerosis of both internal carotid arteries at the skull base with mild  stenosis bilaterally. The anterior cerebral arteries are normal. The right MCA is patent proximally in the M1 segment is normal. There is an early right bifurcation. There is occlusion of a M2 superior division branch in the anterior sylvian fissure (series 8, image 109). The left MCA is normal. Posterior circulation: Both P comms are patent. There is atherosclerotic irregularity of the basilar artery with moderate stenosis of the midportion. The diminutive right vertebral artery terminates in PICA. Both PCAs are patent. There is mild atherosclerotic irregularity bilaterally. Venous sinuses: Normal allowing for venous dominant contrast phase. Anatomic variants: Bilateral fetal origins of the posterior cerebral arteries. Delayed phase: None IMPRESSION: 1. Occlusion of a right M2 superior division branch in the anterior Sylvian fissure. No other intracranial occlusion. 2. No acute intracranial hemorrhage. ASPECTS is 10. 3. 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 4. 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 5. 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 Aortic Atherosclerosis (ICD10-I70.0). These results were called by telephone at the time of interpretation on 02/18/2022 at 8:10 pm to provider [facility] , who verbally acknowledged these results. Electronically Signed   By: Deatra Robinson M.D.   On: 02/18/2022 20:23   CT ANGIO HEAD NECK W WO CM (CODE STROKE)  Result Date: 02/18/2022 CLINICAL DATA:  Left facial droop and right gaze deviation EXAM: CT HEAD WITHOUT CONTRAST CT ANGIOGRAPHY OF THE HEAD AND NECK TECHNIQUE: Contiguous axial images were obtained from the base of the skull through the vertex without intravenous contrast. Multidetector CT imaging of the head and neck was performed using the standard protocol during bolus administration of intravenous contrast. Multiplanar CT image reconstructions and MIPs were obtained to evaluate the vascular anatomy. Carotid stenosis measurements (when applicable) are obtained utilizing NASCET criteria, using the distal internal carotid diameter as the denominator. RADIATION DOSE REDUCTION: This exam was performed according to the departmental dose-optimization program which includes automated exposure control, adjustment of the mA and/or kV according to patient size and/or use of iterative reconstruction technique. CONTRAST:  80mL OMNIPAQUE IOHEXOL 350 MG/ML SOLN COMPARISON:  12/09/2021 head CT FINDINGS: CT HEAD Brain: No acute intracranial hemorrhage. 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 There is periventricular hypoattenuation compatible with chronic microvascular disease. Generalized volume loss. Vascular: Atherosclerosis of the internal carotid arteries at the skull base. Skull: Normal. Sinuses/Orbits: Bilateral posterior ethmoid air cell mucosal thickening. Orbits are normal. ASPECTS (Alberta Stroke Program Early CT  Score) - Ganglionic level infarction (caudate, lentiform nuclei, internal capsule, insula, M1-M3 cortex): 7 - Supraganglionic infarction (M4-M6 cortex): 3 Total score (0-10 with 10 being normal): 10 Review of the MIP images confirms the above findings CTA NECK Aortic arch: Aortic atherosclerosis.  Normal branching pattern. Right carotid system: Atherosclerosis throughout the course of the right common carotid artery. The bifurcation is widely patent. No hemodynamically significant stenosis of the right ICA. Left carotid system: Common carotid atherosclerosis without stenosis. Atherosclerotic calcification at the bifurcation extending into the ICA resulting in 60% stenosis. The distal ICA is widely patent. Vertebral arteries:Left dominant. Multifocal atherosclerosis along the course of both vertebral arteries resulting in mild stenosis of the left mid V2 segment head multifocal severe stenosis of the right P2 segment. Both vertebral arteries remain patent to the skull base. Skeleton: Negative Other neck: Unremarkable. Upper chest: Clear CTA HEAD Anterior circulation: There is calcific atherosclerosis of both internal carotid arteries at the skull base with mild stenosis bilaterally. The anterior cerebral  arteries are normal. The right MCA is patent proximally in the M1 segment is normal. There is an early right bifurcation. There is occlusion of a M2 superior division branch in the anterior sylvian fissure (series 8, image 109). The left MCA is normal. Posterior circulation: Both P comms are patent. There is atherosclerotic irregularity of the basilar artery with moderate stenosis of the midportion. The diminutive right vertebral artery terminates in PICA. Both PCAs are patent. There is mild atherosclerotic irregularity bilaterally. Venous sinuses: Normal allowing for venous dominant contrast phase. Anatomic variants: Bilateral fetal origins of the posterior cerebral arteries. Delayed phase: None IMPRESSION: 1.  Occlusion of a right M2 superior division branch in the anterior Sylvian fissure. No other intracranial occlusion. 2. No acute intracranial hemorrhage. ASPECTS is 10. 3. 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 4. 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 5. 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 Aortic Atherosclerosis (ICD10-I70.0). These results were called by telephone at the time of interpretation on 02/18/2022 at 8:10 pm to provider [facility] , who verbally acknowledged these results. Electronically Signed   By: Deatra RobinsonKevin  Herman M.D.   On: 02/18/2022 20:23    Procedures .Critical Care  Performed by: Pricilla LovelessGoldston, Christia Domke, MD Authorized by: Pricilla LovelessGoldston, Lenell Mcconnell, MD   Critical care provider statement:    Critical care time (minutes):  30   Critical care time was exclusive of:  Separately billable procedures and treating other patients   Critical care was necessary to treat or prevent imminent or life-threatening deterioration of the following conditions:  CNS failure or compromise   Critical care was time spent personally by me on the following activities:  Development of treatment plan with patient or surrogate, discussions with consultants, evaluation of patient's response to treatment, examination of patient, ordering and review of laboratory studies, ordering and review of radiographic studies, ordering and performing treatments and interventions, pulse oximetry, re-evaluation of patient's condition and review of old charts     Medications Ordered in ED Medications  sodium chloride flush (NS) 0.9 % injection 3 mL (has no administration in time range)   stroke: early stages of recovery book (has no administration in time range)  0.9 %  sodium chloride infusion (has no administration in time range)  acetaminophen (TYLENOL) tablet 650 mg (has no administration in time range)    Or  acetaminophen (TYLENOL) 160 MG/5ML solution 650 mg  (has no administration in time range)    Or  acetaminophen (TYLENOL) suppository 650 mg (has no administration in time range)  senna-docusate (Senokot-S) tablet 1 tablet (has no administration in time range)  enoxaparin (LOVENOX) injection 40 mg (has no administration in time range)  albuterol (VENTOLIN HFA) 108 (90 Base) MCG/ACT inhaler 1-2 puff (has no administration in time range)  cyanocobalamin (VITAMIN B12) tablet 1,000 mcg (has no administration in time range)  feeding supplement (ENSURE ENLIVE / ENSURE PLUS) liquid 237 mL (has no administration in time range)  folic acid (FOLVITE) tablet 1 mg (has no administration in time range)  oxyCODONE-acetaminophen (PERCOCET/ROXICET) 5-325 MG per tablet 1 tablet (has no administration in time range)  pantoprazole (PROTONIX) EC tablet 40 mg (has no administration in time range)  rosuvastatin (CRESTOR) tablet 40 mg (has no administration in time range)  insulin aspart (novoLOG) injection 0-9 Units (has no administration in time range)  nitroGLYCERIN 100 mcg/mL intra-arterial injection (has no administration in time range)  clevidipine (CLEVIPREX) 0.5 MG/ML infusion (has no administration  in time range)  nitroGLYCERIN 2 mg in sodium chloride (PF) 0.9 % 60.1 mL (25 mcg/mL) syringe (2 mg  Given 02/18/22 2123)  iohexol (OMNIPAQUE) 300 MG/ML solution 100 mL (has no administration in time range)  iohexol (OMNIPAQUE) 350 MG/ML injection 65 mL (65 mLs Intravenous Contrast Given 02/18/22 2006)  iohexol (OMNIPAQUE) 350 MG/ML injection 75 mL (75 mLs Intravenous Contrast Given 02/18/22 2009)  iohexol (OMNIPAQUE) 350 MG/ML injection 40 mL (40 mLs Intravenous Contrast Given 02/18/22 2030)    ED Course/ Medical Decision Making/ A&P                           Medical Decision Making Amount and/or Complexity of Data Reviewed Labs: ordered.    Details: Creatinine on Chem-8 is mildly bumped from baseline.  Has thrombocytopenia. Radiology: ordered and independent  interpretation performed.    Details: CT head without head bleed.  Risk Decision regarding hospitalization.   Patient presents as a code stroke.  Is protecting her airway.  No TNK given given the thrombocytopenia.  However she is an IR candidate and was taken to IR via neuro.  Blood pressure is well-controlled at this point.  She has tachycardia which appeared sinus on the EMS EKG but due to the urgency of getting her to IR no EKG was performed in the ER.        Final Clinical Impression(s) / ED Diagnoses Final diagnoses:  Acute ischemic stroke [facility])    Rx / DC Orders ED Discharge Orders     None         Sherwood Gambler, MD 02/18/22 2144

## 2022-02-19 ENCOUNTER — Inpatient Hospital Stay (HOSPITAL_COMMUNITY): Payer: Medicare HMO

## 2022-02-19 ENCOUNTER — Other Ambulatory Visit: Payer: Medicare HMO

## 2022-02-19 ENCOUNTER — Encounter (HOSPITAL_COMMUNITY): Payer: Self-pay | Admitting: Interventional Radiology

## 2022-02-19 ENCOUNTER — Inpatient Hospital Stay: Payer: Medicare HMO | Admitting: Oncology

## 2022-02-19 DIAGNOSIS — I63511 Cerebral infarction due to unspecified occlusion or stenosis of right middle cerebral artery: Secondary | ICD-10-CM

## 2022-02-19 DIAGNOSIS — E440 Moderate protein-calorie malnutrition: Secondary | ICD-10-CM | POA: Insufficient documentation

## 2022-02-19 DIAGNOSIS — J9601 Acute respiratory failure with hypoxia: Secondary | ICD-10-CM

## 2022-02-19 LAB — BASIC METABOLIC PANEL
Anion gap: 11 (ref 5–15)
BUN: 25 mg/dL — ABNORMAL HIGH (ref 8–23)
CO2: 20 mmol/L — ABNORMAL LOW (ref 22–32)
Calcium: 8 mg/dL — ABNORMAL LOW (ref 8.9–10.3)
Chloride: 113 mmol/L — ABNORMAL HIGH (ref 98–111)
Creatinine, Ser: 0.97 mg/dL (ref 0.44–1.00)
GFR, Estimated: 60 mL/min — ABNORMAL LOW (ref >60)
Glucose, Bld: 130 mg/dL — ABNORMAL HIGH (ref 70–99)
Potassium: 3.6 mmol/L (ref 3.5–5.1)
Sodium: 144 mmol/L (ref 135–145)

## 2022-02-19 LAB — COMPREHENSIVE METABOLIC PANEL
ALT: 20 U/L (ref 0–44)
AST: 19 U/L (ref 15–41)
Albumin: 1.9 g/dL — ABNORMAL LOW (ref 3.5–5.0)
Alkaline Phosphatase: 118 U/L (ref 38–126)
Anion gap: 12 (ref 5–15)
BUN: 27 mg/dL — ABNORMAL HIGH (ref 8–23)
CO2: 16 mmol/L — ABNORMAL LOW (ref 22–32)
Calcium: 7.8 mg/dL — ABNORMAL LOW (ref 8.9–10.3)
Chloride: 112 mmol/L — ABNORMAL HIGH (ref 98–111)
Creatinine, Ser: 1.03 mg/dL — ABNORMAL HIGH (ref 0.44–1.00)
GFR, Estimated: 56 mL/min — ABNORMAL LOW (ref >60)
Glucose, Bld: 216 mg/dL — ABNORMAL HIGH (ref 70–99)
Potassium: 3.4 mmol/L — ABNORMAL LOW (ref 3.5–5.1)
Sodium: 140 mmol/L (ref 135–145)
Total Bilirubin: 1 mg/dL (ref 0.3–1.2)
Total Protein: 5.3 g/dL — ABNORMAL LOW (ref 6.5–8.1)

## 2022-02-19 LAB — CBC
HCT: 26.9 % — ABNORMAL LOW (ref 36.0–46.0)
Hemoglobin: 8.7 g/dL — ABNORMAL LOW (ref 12.0–15.0)
MCH: 28.2 pg (ref 26.0–34.0)
MCHC: 32.3 g/dL (ref 30.0–36.0)
MCV: 87.1 fL (ref 80.0–100.0)
Platelets: 86 10*3/uL — ABNORMAL LOW (ref 150–400)
RBC: 3.09 MIL/uL — ABNORMAL LOW (ref 3.87–5.11)
RDW: 14.6 % (ref 11.5–15.5)
WBC: 8.3 10*3/uL (ref 4.0–10.5)
nRBC: 0 % (ref 0.0–0.2)

## 2022-02-19 LAB — PROTIME-INR
INR: 1.3 — ABNORMAL HIGH (ref 0.8–1.2)
Prothrombin Time: 16 seconds — ABNORMAL HIGH (ref 11.4–15.2)

## 2022-02-19 LAB — CBC WITH DIFFERENTIAL/PLATELET
Abs Immature Granulocytes: 0.04 10*3/uL (ref 0.00–0.07)
Basophils Absolute: 0 10*3/uL (ref 0.0–0.1)
Basophils Relative: 0 %
Eosinophils Absolute: 0 10*3/uL (ref 0.0–0.5)
Eosinophils Relative: 0 %
HCT: 29.9 % — ABNORMAL LOW (ref 36.0–46.0)
Hemoglobin: 9.2 g/dL — ABNORMAL LOW (ref 12.0–15.0)
Immature Granulocytes: 1 %
Lymphocytes Relative: 10 %
Lymphs Abs: 0.8 10*3/uL (ref 0.7–4.0)
MCH: 27.1 pg (ref 26.0–34.0)
MCHC: 30.8 g/dL (ref 30.0–36.0)
MCV: 88.2 fL (ref 80.0–100.0)
Monocytes Absolute: 0.6 10*3/uL (ref 0.1–1.0)
Monocytes Relative: 7 %
Neutro Abs: 6.9 10*3/uL (ref 1.7–7.7)
Neutrophils Relative %: 82 %
Platelets: 101 10*3/uL — ABNORMAL LOW (ref 150–400)
RBC: 3.39 MIL/uL — ABNORMAL LOW (ref 3.87–5.11)
RDW: 14.6 % (ref 11.5–15.5)
WBC: 8.4 10*3/uL (ref 4.0–10.5)
nRBC: 0 % (ref 0.0–0.2)

## 2022-02-19 LAB — PHOSPHORUS: Phosphorus: 3.2 mg/dL (ref 2.5–4.6)

## 2022-02-19 LAB — LIPID PANEL
Cholesterol: 70 mg/dL (ref 0–200)
HDL: 12 mg/dL — ABNORMAL LOW (ref >40)
LDL Cholesterol: 31 mg/dL (ref 0–99)
Total CHOL/HDL Ratio: 5.8 RATIO
Triglycerides: 134 mg/dL (ref <150)
VLDL: 27 mg/dL (ref 0–40)

## 2022-02-19 LAB — MAGNESIUM
Magnesium: 1.9 mg/dL (ref 1.7–2.4)
Magnesium: 1.9 mg/dL (ref 1.7–2.4)

## 2022-02-19 LAB — ECHOCARDIOGRAM COMPLETE
AR max vel: 1.92 cm2
AV Area VTI: 1.93 cm2
AV Area mean vel: 1.89 cm2
AV Mean grad: 17 mmHg
AV Peak grad: 29.9 mmHg
Ao pk vel: 2.73 m/s
Area-P 1/2: 4.89 cm2
MV M vel: 5.03 m/s
MV Peak grad: 101.2 mmHg
S' Lateral: 2.4 cm
Weight: 2134.05 oz

## 2022-02-19 LAB — CREATININE, SERUM
Creatinine, Ser: 0.98 mg/dL (ref 0.44–1.00)
GFR, Estimated: 59 mL/min — ABNORMAL LOW (ref >60)

## 2022-02-19 LAB — GLUCOSE, CAPILLARY
Glucose-Capillary: 117 mg/dL — ABNORMAL HIGH (ref 70–99)
Glucose-Capillary: 117 mg/dL — ABNORMAL HIGH (ref 70–99)
Glucose-Capillary: 120 mg/dL — ABNORMAL HIGH (ref 70–99)
Glucose-Capillary: 147 mg/dL — ABNORMAL HIGH (ref 70–99)
Glucose-Capillary: 147 mg/dL — ABNORMAL HIGH (ref 70–99)
Glucose-Capillary: 168 mg/dL — ABNORMAL HIGH (ref 70–99)

## 2022-02-19 LAB — APTT: aPTT: 32 seconds (ref 24–36)

## 2022-02-19 LAB — ETHANOL: Alcohol, Ethyl (B): <10 mg/dL (ref <10)

## 2022-02-19 LAB — HEMOGLOBIN A1C
Hgb A1c MFr Bld: 7.1 % — ABNORMAL HIGH (ref 4.8–5.6)
Mean Plasma Glucose: 157 mg/dL

## 2022-02-19 LAB — MRSA NEXT GEN BY PCR, NASAL: MRSA by PCR Next Gen: NOT DETECTED

## 2022-02-19 MED ORDER — PANTOPRAZOLE SODIUM 40 MG IV SOLR
40.0000 mg | INTRAVENOUS | Status: DC
  Administered 2022-02-19 – 2022-02-22 (×4): 40 mg via INTRAVENOUS
  Filled 2022-02-19 (×4): qty 10

## 2022-02-19 MED ORDER — ENSURE ENLIVE PO LIQD: 237.0000 mL | Freq: Three times a day (TID) | ORAL | Status: DC

## 2022-02-19 MED ORDER — FENTANYL CITRATE PF 50 MCG/ML IJ SOSY
25.0000 ug | PREFILLED_SYRINGE | INTRAMUSCULAR | Status: AC | PRN
  Administered 2022-02-19 – 2022-02-20 (×3): 25 ug via INTRAVENOUS
  Filled 2022-02-19 (×3): qty 1

## 2022-02-19 MED ORDER — PHENYLEPHRINE HCL-NACL 20-0.9 MG/250ML-% IV SOLN
25.0000 ug/min | INTRAVENOUS | Status: DC
  Administered 2022-02-19: 18 ug/min via INTRAVENOUS

## 2022-02-19 MED ORDER — PROPOFOL 1000 MG/100ML IV EMUL
5.0000 ug/kg/min | INTRAVENOUS | Status: DC
  Administered 2022-02-19: 50 ug/kg/min via INTRAVENOUS
  Administered 2022-02-19: 55 ug/kg/min via INTRAVENOUS
  Filled 2022-02-19 (×2): qty 100

## 2022-02-19 MED ORDER — VITAL 1.5 CAL PO LIQD
1000.0000 mL | ORAL | Status: DC
  Administered 2022-02-19 – 2022-02-20 (×2): 1000 mL

## 2022-02-19 MED ORDER — ACETAMINOPHEN 325 MG PO TABS: 650.0000 mg | ORAL_TABLET | ORAL | Status: DC | PRN

## 2022-02-19 MED ORDER — ACETAMINOPHEN 650 MG RE SUPP: 650.0000 mg | RECTAL | Status: DC | PRN

## 2022-02-19 MED ORDER — SODIUM CHLORIDE 0.9 % IV SOLN
250.0000 mL | INTRAVENOUS | Status: DC
  Administered 2022-02-22: 250 mL via INTRAVENOUS

## 2022-02-19 MED ORDER — VITAMIN B-12 1000 MCG PO TABS
1000.0000 ug | ORAL_TABLET | Freq: Every day | ORAL | Status: DC
  Administered 2022-02-19 – 2022-02-25 (×7): 1000 ug
  Filled 2022-02-19 (×7): qty 1

## 2022-02-19 MED ORDER — ENOXAPARIN SODIUM 40 MG/0.4ML IJ SOSY
40.0000 mg | PREFILLED_SYRINGE | INTRAMUSCULAR | Status: DC
  Administered 2022-02-19 – 2022-02-24 (×6): 40 mg via SUBCUTANEOUS
  Filled 2022-02-19 (×6): qty 0.4

## 2022-02-19 MED ORDER — VITAL HIGH PROTEIN PO LIQD: 1000.0000 mL | ORAL | Status: DC

## 2022-02-19 MED ORDER — SENNOSIDES-DOCUSATE SODIUM 8.6-50 MG PO TABS: 1.0000 | ORAL_TABLET | Freq: Every evening | ORAL | Status: DC | PRN

## 2022-02-19 MED ORDER — DOCUSATE SODIUM 50 MG/5ML PO LIQD
100.0000 mg | Freq: Two times a day (BID) | ORAL | Status: DC
  Administered 2022-02-19 – 2022-02-25 (×10): 100 mg
  Filled 2022-02-19 (×12): qty 10

## 2022-02-19 MED ORDER — ACETAMINOPHEN 160 MG/5ML PO SOLN
650.0000 mg | ORAL | Status: DC | PRN
  Administered 2022-02-19 – 2022-02-25 (×16): 650 mg
  Filled 2022-02-19 (×17): qty 20.3

## 2022-02-19 MED ORDER — FOLIC ACID 1 MG PO TABS
1.0000 mg | ORAL_TABLET | Freq: Every day | ORAL | Status: DC
  Administered 2022-02-19 – 2022-02-25 (×7): 1 mg
  Filled 2022-02-19 (×7): qty 1

## 2022-02-19 MED ORDER — OXYCODONE-ACETAMINOPHEN 5-325 MG PO TABS
1.0000 | ORAL_TABLET | Freq: Four times a day (QID) | ORAL | Status: DC | PRN
  Administered 2022-02-24: 1
  Filled 2022-02-19: qty 1

## 2022-02-19 MED ORDER — FENTANYL CITRATE PF 50 MCG/ML IJ SOSY
25.0000 ug | PREFILLED_SYRINGE | INTRAMUSCULAR | Status: DC | PRN
  Administered 2022-02-20: 100 ug via INTRAVENOUS
  Administered 2022-02-20 – 2022-02-21 (×3): 50 ug via INTRAVENOUS
  Filled 2022-02-19 (×2): qty 1
  Filled 2022-02-19: qty 2
  Filled 2022-02-19: qty 1

## 2022-02-19 MED ORDER — PROSOURCE TF20 ENFIT COMPATIBL EN LIQD
60.0000 mL | Freq: Every day | ENTERAL | Status: DC
  Administered 2022-02-19 – 2022-02-20 (×2): 60 mL
  Filled 2022-02-19 (×2): qty 60

## 2022-02-19 MED ORDER — ROSUVASTATIN CALCIUM 20 MG PO TABS
40.0000 mg | ORAL_TABLET | Freq: Every day | ORAL | Status: DC
  Administered 2022-02-19 – 2022-02-22 (×4): 40 mg
  Filled 2022-02-19 (×4): qty 2

## 2022-02-19 MED ORDER — ARFORMOTEROL TARTRATE 15 MCG/2ML IN NEBU
15.0000 ug | INHALATION_SOLUTION | Freq: Two times a day (BID) | RESPIRATORY_TRACT | Status: DC
  Administered 2022-02-19 – 2022-02-25 (×11): 15 ug via RESPIRATORY_TRACT
  Filled 2022-02-19 (×12): qty 2

## 2022-02-19 MED ORDER — POLYETHYLENE GLYCOL 3350 17 G PO PACK
17.0000 g | PACK | Freq: Every day | ORAL | Status: DC
  Administered 2022-02-19 – 2022-02-25 (×5): 17 g
  Filled 2022-02-19 (×5): qty 1

## 2022-02-19 MED ORDER — POTASSIUM CHLORIDE 20 MEQ PO PACK
40.0000 meq | PACK | Freq: Once | ORAL | Status: AC
  Administered 2022-02-19: 40 meq
  Filled 2022-02-19: qty 2

## 2022-02-19 MED ORDER — REVEFENACIN 175 MCG/3ML IN SOLN
175.0000 ug | Freq: Every day | RESPIRATORY_TRACT | Status: DC
  Administered 2022-02-19 – 2022-02-25 (×6): 175 ug via RESPIRATORY_TRACT
  Filled 2022-02-19 (×6): qty 3

## 2022-02-19 NOTE — Consult Note (Signed)
NAME:  Shannon Chen, MRN:  [id number], DOB:  07-Aug-1945, LOS: 1 ADMISSION DATE:  02/18/2022, CONSULTATION DATE: 02/19/2022 REFERRING MD: Dr. Lorrin Goodell, CHIEF COMPLAINT: Ventilator management  History of Present Illness:  Patient presented with sudden onset left-sided weakness, aphasia, right gaze deviation. Patient was found slumped over, not talking had been seen 15 minutes earlier Brought in for code stroke.  At baseline, requires some help, able to walk around with a walker  Had emergent thrombectomy Achieved TICI 2B and coil embolization of M3 MCA remain TICI 2A, related to branch embolization On vent postprocedure  Pertinent  Medical History   Past Medical History:  Diagnosis Date   Arthritis    AV block, Mobitz II    a. 09/2016 s/p MDT [id number] Azure XT DR MRI DC PPM   CAD (coronary artery disease)    a. 11/2020 NSTEMI/Cath: LM mild diff dzs, LAD, diff dzs, LCX 30d, RCA Ca2+ w/ diff dzs throughout, 81m. FFR cath not able to advance-->Med Rx.   Carotid artery disease ([place])    a. 09/2016 Carotid U/S: RICA 99-24%, LICA 26-83%;  b. 05/1960 CTA Neck: RICA 60, RCCA 30, LICA 22%, L Vert 60, L Basilar 70; c. 10/2019 s/p R CEA.   Essential hypertension    History of stress test    a. 12/2016 MV: EF 51%, no ischemia/infarct; b. 10/2019 MV: EF 61%, no ischemia/infarct.   Hyperlipidemia    Hypertrophic cardiomyopathy ([place])    a. 09/2016 Echo: EF 60-65%, no rwma, Gr1 DD, Ca2+ MV annulus; b. 11/2017 Echo: EF 70-75%, no rwma, Gr1 DD, mild AS vs dynamic LVOT obs. Nl RV fxn. Hyperdynamic LVEF w/ sev LVH; c. 10/2019 Echo: EF 70-75%, no rwma, gr1 DD, sev LVH, Gr1 DD, nl PASP, triv MR, mild-mod Ao sclerosis; c. 05/2020 Echo: EF 60-65%; e. 11/2020 Echo: EF 60-65%, sev LVH, nl RV fxn, Mild MR. Ao Sclerosis.   Presence of permanent cardiac pacemaker    Significant Hospital Events: Including procedures, antibiotic start and stop dates in addition to other pertinent events    02/18/2022-thrombectomy  Interim History / Subjective:  Patient with altered mental status, right gaze preference and aphasia, activated code stroke  Objective   Blood pressure (!) 131/57, pulse 76, temperature 98.7 F (37.1 C), temperature source Oral, resp. rate 17, weight 60.5 kg, SpO2 95 %.    Vent Mode: PRVC FiO2 (%):  [40 %] 40 % Set Rate:  [18 bmp] 18 bmp Vt Set:  [490 mL] 490 mL PEEP:  [5 cmH20] 5 cmH20 Plateau Pressure:  [17 cmH20] 17 cmH20   Intake/Output Summary (Last 24 hours) at 02/19/2022 0008 Last data filed at 02/18/2022 2350 Gross per 24 hour  Intake 1000 ml  Output 100 ml  Net 900 ml   Filed Weights   02/18/22 1900 02/19/22 0000  Weight: 61.8 kg 60.5 kg    Examination: General: Elderly, chronically ill-appearing HENT: Endotracheal tube in place, moist oral mucosa Lungs: Clear breath sounds bilaterally  Cardiovascular: S1-S2 appreciated Abdomen: Soft, bowel sounds appreciated Extremities: No clubbing, no edema Neuro: Sedated GU:   CT angio Head and Neck with contrast: Right MCA M2 occlusion  CT perfusion: Core 55ml, penumbra 17ml.   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 S/p thrombectomy S/p coil embolization of M3 Achieved TICI 2A -Neurochecks -MRI pending  Hypoxemic respiratory failure -Continue mechanical ventilation -Target TVol 6-8cc/kgIBW -Target Plateau Pressure < 30cm H20 -Ventilator  associated pneumonia prevention protocol  Hypertension -Goal blood pressure 100-140 -Continue Cleviprex  Type 2 diabetes -SSI  Chronic obstructive pulmonary disease -Bronchodilators  History of coronary artery disease History of hypertrophic cardiomyopathy -Monitor  AKI -Avoid nephrotoxic medications -Maintain renal perfusion -Trend electrolytes  Background history of obstructive lung disease -On Trelegy as outpatient -As needed albuterol -Yupelri and Brovana   Best  Practice (right click and "Reselect all SmartList Selections" daily)   Diet/type: NPO DVT prophylaxis: SCD GI prophylaxis: PPI Lines: N/A Foley:  N/A Code Status:  full code Last date of multidisciplinary goals of care discussion [pending]  Labs   CBC: Recent Labs  Lab 02/18/22 1940 02/18/22 2002  WBC 9.7  --   NEUTROABS 7.8*  --   HGB 11.3* 11.2*  HCT 36.4 33.0*  MCV 90.8  --   PLT 74*  --     Basic Metabolic Panel: Recent Labs  Lab 02/18/22 2002  NA 140  K 5.5*  CL 114*  GLUCOSE 158*  BUN 48*  CREATININE 1.30*   GFR: Estimated Creatinine Clearance: 32.8 mL/min (A) (by C-G formula based on SCr of 1.3 mg/dL (H)). Recent Labs  Lab 02/18/22 1940  WBC 9.7    Liver Function Tests: No results for input(s): "AST", "ALT", "ALKPHOS", "BILITOT", "PROT", "ALBUMIN" in the last 168 hours. No results for input(s): "LIPASE", "AMYLASE" in the last 168 hours. No results for input(s): "AMMONIA" in the last 168 hours.  ABG    Component Value Date/Time   HCO3 25.4 12/19/2021 0449   TCO2 19 (L) 02/18/2022 2002   ACIDBASEDEF 0.4 12/19/2021 0449   O2SAT 90 12/19/2021 0449     Coagulation Profile: No results for input(s): "INR", "PROTIME" in the last 168 hours.  Cardiac Enzymes: No results for input(s): "CKTOTAL", "CKMB", "CKMBINDEX", "TROPONINI" in the last 168 hours.  HbA1C: Hgb A1c MFr Bld  Date/Time Value Ref Range Status  12/09/2021 10:11 PM 6.4 (H) 4.8 - 5.6 % Final    Comment:    (NOTE) Pre diabetes:          5.7%-6.4%  Diabetes:              >6.4%  Glycemic control for   <7.0% adults with diabetes   12/12/2020 10:24 PM 6.7 (H) 4.8 - 5.6 % Final    Comment:    (NOTE) Pre diabetes:          5.7%-6.4%  Diabetes:              >6.4%  Glycemic control for   <7.0% adults with diabetes     CBG: Recent Labs  Lab 02/18/22 2332  GLUCAP 184*    Review of Systems:   Unable to provide history  Past Medical History:  She,  has a past medical  history of Arthritis, AV block, Mobitz II, CAD (coronary artery disease), Carotid artery disease ([name]), Essential hypertension, History of stress test, Hyperlipidemia, Hypertrophic cardiomyopathy ([name]), and Presence of permanent cardiac pacemaker.   Surgical History:   Past Surgical History:  Procedure Laterality Date   ABDOMINAL HYSTERECTOMY     CORONARY STENT INTERVENTION N/A 12/31/2020   Procedure: CORONARY STENT INTERVENTION;  Surgeon: Iran Ouch, MD;  Location: [facility];  Service: Cardiovascular;  Laterality: N/A;   ENDARTERECTOMY Right 11/16/2019   Procedure: ENDARTERECTOMY CAROTID;  Surgeon: Renford Dills, MD;  Location: [facility] OR[facility];  Service: Vascular;  Laterality: Right;   ENDOVASCULAR REPAIR/STENT GRAFT N/A 12/18/2021   Procedure: ENDOVASCULAR REPAIR/STENT GRAFT;  Surgeon: Katha Cabal, MD;  Location: [facility];  Service: Cardiovascular;  Laterality: N/A;   INTRAVASCULAR PRESSURE WIRE/FFR STUDY N/A 12/13/2020   Procedure: INTRAVASCULAR PRESSURE WIRE/FFR STUDY;  Surgeon: Leonie Man, MD;  Location: [facility];  Service: Cardiovascular;  Laterality: N/A;   LEFT HEART CATH AND CORONARY ANGIOGRAPHY N/A 12/13/2020   Procedure: LEFT HEART CATH AND CORONARY ANGIOGRAPHY;  Surgeon: Leonie Man, MD;  Location: [facility];  Service: Cardiovascular;  Laterality: N/A;   LEFT HEART CATH AND CORONARY ANGIOGRAPHY N/A 12/31/2020   Procedure: LEFT HEART CATH AND CORONARY ANGIOGRAPHY;  Surgeon: Wellington Hampshire, MD;  Location: [facility];  Service: Cardiovascular;  Laterality: N/A;   MUSCLE BIOPSY Left 12/16/2021   Procedure: MUSCLE BIOPSY;  Surgeon: Ronny Bacon, MD;  Location: [facility] OR[facility];  Service: General;  Laterality: Left;   PACEMAKER IMPLANT N/A 10/08/2016   Procedure: Pacemaker Implant;  Surgeon: Evans Lance, MD;  Location: [facility];  Service: Cardiovascular;  Laterality: N/A;     Social History:    reports that she quit smoking about 3 years ago. Her smoking use included cigarettes. She has a 30.00 pack-year smoking history. She has been exposed to tobacco smoke. She has never used smokeless tobacco. She reports that she does not drink alcohol and does not use drugs.   Family History:  Her family history includes CAD in her father; Diabetes in her mother.   Allergies No Known Allergies     The patient is critically ill with multiple organ systems failure and requires high complexity decision making for assessment and support, frequent evaluation and titration of therapies, application of advanced monitoring technologies and extensive interpretation of multiple databases. Critical Care Time devoted to patient care services described in this note independent of APP/resident time (if applicable)  is 31 minutes.   Sherrilyn Rist MD [facility] Personal pager: See Amion If unanswered, please page CCM On-call: [phone]

## 2022-02-19 NOTE — Progress Notes (Signed)
PT Cancellation Note  Patient Details Name: LUNABELLA BADGETT MRN: [id number] DOB: 1945-08-10   Cancelled Treatment:    Reason Eval/Treat Not Completed: Medical issues which prohibited therapy (Pt still sedated per nurse and nurse wants PT to wait to tomorrow.)   Alvira Philips 02/19/2022, 8:39 AM Dariela Stoker M,PT Acute Rehab Services [phone]

## 2022-02-19 NOTE — Progress Notes (Signed)
SLP Cancellation Note  Patient Details Name: Shannon Chen MRN: [id number] DOB: 11/28/1945   Cancelled treatment:       Reason Eval/Treat Not Completed: Patient not medically ready (SLP consulted for speech-language-cognition evaluation; pt is currently is on the vent so SLP will follow up on subsequent date.)  Arman Loy I. Hardin Negus, [place], [place] Office number [phone]  Horton Marshall 02/19/2022, 2:31 PM

## 2022-02-19 NOTE — Anesthesia Postprocedure Evaluation (Addendum)
Anesthesia Post Note  Patient: Shannon Chen  Procedure(s) Performed: IR WITH ANESTHESIA     Patient location during evaluation: ICU Anesthesia Type: General Level of consciousness: sedated and patient remains intubated per anesthesia plan Pain management: pain level controlled Vital Signs Assessment: post-procedure vital signs reviewed and stable Respiratory status: patient remains intubated per anesthesia plan and patient on ventilator - see flowsheet for VS Cardiovascular status: stable Anesthetic complications: no   No notable events documented.  Last Vitals:  Vitals:   02/19/22 0615 02/19/22 0630  BP:    Pulse: 80 80  Resp: 18 18  Temp:    SpO2: 100% 100%    Last Pain:  Vitals:   02/19/22 0400  TempSrc: Oral                 Nolon Nations

## 2022-02-19 NOTE — Progress Notes (Signed)
Patient was transported to MRI & back to 3M11 without any complications.

## 2022-02-19 NOTE — Consult Note (Signed)
WOC Nurse Consult Note: Reason for Consult: Left anterior thigh with nonintact lesion.  Unclear etiology.   Wound type: unclear, not over bony prominence.  Pressure Injury POA: NA Measurement: 2 cm x 1 cm x 1.5 cm punched out appearance Wound bed: unable to visualize Drainage (amount, consistency, odor) minimal serosanguinous  musty odor Periwound: intact Dressing procedure/placement/frequency: Cleanse left thigh wound with NS and pat dry.  Apply iodoform packing strip. Cover with gauze and tape.  Change daily.  Will not follow at this time.  Please re-consult if needed.  Estrellita Ludwig MSN, RN, FNP-BC [facility]ON Wound, Ostomy, Continence Nurse [facility] [phone] Pager [phone]

## 2022-02-19 NOTE — Progress Notes (Signed)
Initial Nutrition Assessment  DOCUMENTATION CODES:   Non-severe (moderate) malnutrition in context of chronic illness  INTERVENTION:   Once OG tube is advanced and placement of side port below GE junction is confirmed, initiate tube feeds: - Start Vital 1.5 @ 20 ml/hr and advance by 10 ml q 6 hours to goal rate of 50 ml/hr (1200 ml/day) - PROSource TF20 60 ml daily  Tube feeding regimen at goal rate provides 1880 kcal, 101 grams of protein, and 917 ml of H2O.   NUTRITION DIAGNOSIS:   Moderate Malnutrition related to chronic illness as evidenced by mild fat depletion, moderate muscle depletion.  GOAL:   Patient will meet greater than or equal to 90% of their needs  MONITOR:   Vent status, Labs, Weight trends, TF tolerance  REASON FOR ASSESSMENT:   Ventilator, Consult Enteral/tube feeding initiation and management  ASSESSMENT:   77 year old female who presented to the ED on 1/02 from group home as a Code Stroke. PMH of CAD, heart block s/p PPM, HTN, HLD, R carotid endarterectomy, kidney mass, PAD, T2DM. Pt admitted with acute R MCA stroke with R MCA M2 occlusion.  01/02 - s/p mechanical thrombectomy, endovascular vessel embolization  Discussed pt with RN and during ICU rounds. Consult received for enteral nutrition initiation and management. Pt with OG tube tip in stomach but side port at GE junction per abdominal x-ray from early this AM. Discussed with RN who will advance tube and obtain repeat abdominal x-ray after pt goes to MRI and before starting tube feeds.  Unable to obtain diet and weight history at this time. Reviewed weight history in chart. Pt with progressive weight loss over the last 10-11 months. Overall, pt has lost 11.5 kg since 04/08/21. This is a 16% weight loss in just over 10 months which is not quite clinically significant for timeframe but is concerning given progressive nature of weight loss. Pt meets criteria for moderate malnutrition based on  NFPE.  Will start tube feeds at trickle rate and slowly advance to goal. RN aware.  Patient is currently intubated on ventilator support MV: 8.6 L/min Temp (24hrs), Avg:98.9 F (37.2 C), Min:98.1 F (36.7 C), Max:100.1 F (37.8 C)  Drips: Propofol: stopped this AM Cleviprex: 16 mg/hr (provides 1536 kcal daily from lipid) NS: 75 ml/hr  Medications reviewed and include: vitamin B12 1000 mcg daily, colace, folic acid, SSI q 4 hours, IV protonix, miralax   Labs reviewed: BUN 25, hemoglobin 9.2, platelets 101  CBG's: 117-184  UOP: 1010 ml x 24 hours I/O's: +1.4 L since admit  NUTRITION - FOCUSED PHYSICAL EXAM:  Flowsheet Row Most Recent Value  Orbital Region Moderate depletion  Upper Arm Region Mild depletion  Thoracic and Lumbar Region Mild depletion  Buccal Region Unable to assess  Temple Region Mild depletion  Clavicle Bone Region Moderate depletion  Clavicle and Acromion Bone Region Moderate depletion  Scapular Bone Region Mild depletion  Dorsal Hand Moderate depletion  Patellar Region Mild depletion  Anterior Thigh Region Moderate depletion  Posterior Calf Region Moderate depletion  Edema (RD Assessment) None  Hair Reviewed  Eyes Reviewed  Mouth Reviewed  Skin Reviewed  Nails Reviewed       Diet Order:   Diet Order             Diet NPO time specified  Diet effective now                   EDUCATION NEEDS:   Not appropriate for education at  this time  Skin:  Skin Assessment: Reviewed RN Assessment  Last BM:  no documented BM  Height:   Ht Readings from Last 1 Encounters:  02/06/22 5' 5.5" (1.664 m)    Weight:   Wt Readings from Last 1 Encounters:  02/19/22 60.5 kg    Ideal Body Weight:  58 kg  BMI:  Body mass index is 21.86 kg/m.  Estimated Nutritional Needs:   Kcal:  1800-2000  Protein:  90-105 grams  Fluid:  1.8-2.0 L    Gustavus Bryant, MS, RD, LDN Inpatient Clinical Dietitian Please see AMiON for contact information.

## 2022-02-19 NOTE — Progress Notes (Signed)
OT Cancellation Note  Patient Details Name: Shannon Chen MRN: [id number] DOB: 12/29/1945   Cancelled Treatment:    Reason Eval/Treat Not Completed: Medical issues which prohibited therapy. Pt intubated and sedated, via chat text with RN, we will wait until tomorrow and check back as to appropriateness for evaluation.  [place], OTR/L Acute Rehab Services [facility] [phone] Office [phone]    Almon Register 02/19/2022, 8:44 AM

## 2022-02-19 NOTE — Progress Notes (Signed)
Called MRI. MRI is going to reach out to [place] [phone] once patient has been cleared.

## 2022-02-19 NOTE — Progress Notes (Addendum)
   NAME:  Shannon Chen, MRN:  [id number], DOB:  Jun 28, 1945, LOS: 1 ADMISSION DATE:  02/18/2022, CONSULTATION DATE: 02/19/2022 REFERRING MD: Dr. Lorrin Goodell, CHIEF COMPLAINT: Ventilator management  History of Present Illness:  Patient presented with sudden onset left-sided weakness, aphasia, right gaze deviation. Patient was found slumped over, not talking had been seen 15 minutes earlier Brought in for code stroke.  At baseline, requires some help, able to walk around with a walker  Had emergent thrombectomy Achieved TICI 2B and coil embolization of M3 MCA remain TICI 2A, related to branch embolization On vent postprocedure  Pertinent  Medical History   has a past medical history of Arthritis, AV block, Mobitz II, CAD (coronary artery disease), Carotid artery disease ([place]), Essential hypertension, History of stress test, Hyperlipidemia, Hypertrophic cardiomyopathy ([place]), and Presence of permanent cardiac pacemaker.   Significant Hospital Events: Including procedures, antibiotic start and stop dates in addition to other pertinent events   02/18/2022-thrombectomy  Interim History / Subjective:  Thrombectomy and coiling last night. Remains on vent. Needs tighter BP control prior to SAT.   Objective   Blood pressure (!) 115/31, pulse 80, temperature 98.5 F (36.9 C), temperature source Oral, resp. rate 18, weight 60.5 kg, SpO2 100 %.    Vent Mode: PRVC FiO2 (%):  [40 %] 40 % Set Rate:  [18 bmp] 18 bmp Vt Set:  [490 mL] 490 mL PEEP:  [5 cmH20] 5 cmH20 Plateau Pressure:  [17 cmH20] 17 cmH20   Intake/Output Summary (Last 24 hours) at 02/19/2022 0740 Last data filed at 02/19/2022 0600 Gross per 24 hour  Intake 1755.26 ml  Output 1130 ml  Net 625.26 ml    Filed Weights   02/18/22 1900 02/19/22 0000  Weight: 61.8 kg 60.5 kg    Examination:  General: Elderly female in NAD on vent HENT: [place]/AT, PERRL, no JVD Lungs: Clear bilateral breath sounds Cardiovascular: RRR, no MRG Abdomen:  Soft, ND, NT Extremities: No acute deformity or ROM limitation Neuro: Sedated  CT angio Head and Neck with contrast: Right MCA M2 occlusion  CT perfusion: Core 34ml, penumbra 69ml.   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   Hypoxemic respiratory failure - Continue mechanical ventilation - Target TVol 6-8cc/kgIBW - Target Plateau Pressure < 30cm H20 - Ventilator associated pneumonia prevention protocol - Can pursue SBT/SAT once BP better controlled and MRI is completed  Hypertension - Goal blood pressure 130-150 per Stroke service.  - Continue Cleviprex  Type 2 diabetes - SSI  Chronic obstructive pulmonary disease without exacerbation - On Trelegy as outpatient - As needed albuterol - Yupelri and Brovana   History of coronary artery disease History of hypertrophic cardiomyopathy - Monitor  AKI - Maintain renal perfusion - Trend electrolytes  Best Practice (right click and "Reselect all SmartList Selections" daily)   Diet/type: NPO DVT prophylaxis: SCD GI prophylaxis: PPI Lines: N/A Foley:  N/A Code Status:  full code Last date of multidisciplinary goals of care discussion [pending]   Critical care time 42 minutes  Georgann Housekeeper, AGACNP-BC [facility]  See Amion for personal pager PCCM on call pager [phone] until 7pm. Please call Elink 7p-7a. [phone]  02/19/2022 8:18 AM

## 2022-02-19 NOTE — Progress Notes (Signed)
Pt arrived from IR with the following personal belongings:  - 1 pair of green pants - 1 multi colored shirt - 2 white socks  No family at bedside at this time.

## 2022-02-19 NOTE — Progress Notes (Signed)
Consult for USGPIV for vasopressor protocol. Veins in both arms too small and too deep for vasopressor protocol. [place] RN notified.

## 2022-02-19 NOTE — Progress Notes (Signed)
Echocardiogram 2D Echocardiogram has been performed.  Ronny Flurry 02/19/2022, 10:13 AM

## 2022-02-19 NOTE — Progress Notes (Addendum)
STROKE TEAM PROGRESS NOTE   INTERVAL HISTORY Patient is seen in her room with no family at the bedside.  Yesterday, she was noted to have sudden onset left facial droop, left-sided weakness, right gaze deviation and aphasia.  TNK was not offered due to low platelet count, and mechanical thrombectomy was performed.  TICI2B flow was achieved in the right MCA, however procedure was complicated by vessel rupture requiring vessel sacrifice with coil embolization.  Patient remained intubated after procedure and was transferred to the ICU. Blood pressure is regular adequately controlled on Cleviprex.  Exam is limited due to sedation but she remains aphasic with dense left hemiplegia with right gaze deviation. Vitals:   02/19/22 1045 02/19/22 1100 02/19/22 1115 02/19/22 1142  BP:  (!) 130/38    Pulse: 89 86 86   Resp: 19 18 12    Temp:    100.1 F (37.8 C)  TempSrc:    Axillary  SpO2: 99% 99% 100%   Weight:       CBC:  Recent Labs  Lab 02/18/22 1940 02/18/22 2002 02/19/22 0420 02/19/22 0612  WBC 9.7  --  8.3 8.4  NEUTROABS 7.8*  --   --  6.9  HGB 11.3*   < > 8.7* 9.2*  HCT 36.4   < > 26.9* 29.9*  MCV 90.8  --  87.1 88.2  PLT 74*  --  86* 101*   < > = values in this interval not displayed.   Basic Metabolic Panel:  Recent Labs  Lab 02/18/22 2353 02/18/22 2355 02/19/22 0612  NA 140  --  144  K 3.4*  --  3.6  CL 112*  --  113*  CO2 16*  --  20*  GLUCOSE 216*  --  130*  BUN 27*  --  25*  CREATININE 1.03* 0.98 0.97  CALCIUM 7.8*  --  8.0*  MG  --   --  1.9   Lipid Panel:  Recent Labs  Lab 02/19/22 0420  CHOL 70  TRIG 134  HDL 12*  CHOLHDL 5.8  VLDL 27  LDLCALC 31   HgbA1c: No results for input(s): "HGBA1C" in the last 168 hours. Urine Drug Screen: No results for input(s): "LABOPIA", "COCAINSCRNUR", "LABBENZ", "AMPHETMU", "THCU", "LABBARB" in the last 168 hours.  Alcohol Level  Recent Labs  Lab 02/18/22 2351  ETH <10    IMAGING past 24 hours ECHOCARDIOGRAM  COMPLETE  Result Date: 02/19/2022    ECHOCARDIOGRAM REPORT   Patient Name:   Shannon Chen Date of Exam: 02/19/2022 Medical Rec #:  04/20/2022       Height:       65.5 in Accession #:    [id number]      Weight:       133.4 lb Date of Birth:  1945/10/06       BSA:          1.675 m Patient Age:    76 years        BP:           115/31 mmHg Patient Gender: F               HR:           83 bpm. Exam Location:  Inpatient Procedure: 2D Echo, Cardiac Doppler and Color Doppler Indications:    Stroke I63.9  History:        Patient has prior history of Echocardiogram examinations, most  recent 12/30/2020. Cardiomyopathy, Previous Myocardial                 Infarction and CAD, PAD and COPD, Arrythmias:RBBB and Atrial                 Fibrillation, Signs/Symptoms:Hypotension and Syncope; Risk                 Factors:Hypertension, Diabetes, Current Smoker and Dyslipidemia.  Sonographer:    Lucendia HerrlichShanika Turnbull Referring Phys: [id number] [facility] KHALIQDINA IMPRESSIONS  1. Left ventricular ejection fraction, by estimation, is 70 to 75%. The left ventricle has hyperdynamic function. The left ventricle has no regional wall motion abnormalities. There is moderate concentric left ventricular hypertrophy. Left ventricular diastolic parameters are consistent with Grade II diastolic dysfunction (pseudonormalization).  2. Right ventricular systolic function is normal. The right ventricular size is normal. There is normal pulmonary artery systolic pressure.  3. The mitral valve is degenerative. Mild to moderate mitral valve regurgitation. Moderate mitral annular calcification.  4. The aortic valve is abnormal. Aortic valve regurgitation is mild to moderate. Mild aortic valve stenosis.  5. The inferior vena cava is normal in size with greater than 50% respiratory variability, suggesting right atrial pressure of 3 mmHg. Comparison(s): Prior images reviewed side by side. Aortic valve is not well visualized in patients past studies or  recent CT. Aortic regurgitation not seen in prior study. FINDINGS  Left Ventricle: Left ventricular ejection fraction, by estimation, is 70 to 75%. The left ventricle has hyperdynamic function. The left ventricle has no regional wall motion abnormalities. The left ventricular internal cavity size was normal in size. There is moderate concentric left ventricular hypertrophy. Left ventricular diastolic parameters are consistent with Grade II diastolic dysfunction (pseudonormalization). Right Ventricle: The right ventricular size is normal. No increase in right ventricular wall thickness. Right ventricular systolic function is normal. There is normal pulmonary artery systolic pressure. The tricuspid regurgitant velocity is 2.58 m/s, and  with an assumed right atrial pressure of 3 mmHg, the estimated right ventricular systolic pressure is 29.6 mmHg. Left Atrium: Left atrial size was normal in size. Right Atrium: Right atrial size was normal in size. Pericardium: There is no evidence of pericardial effusion. Mitral Valve: The mitral valve is degenerative in appearance. Moderate mitral annular calcification. Mild to moderate mitral valve regurgitation. Tricuspid Valve: The tricuspid valve is normal in structure. Tricuspid valve regurgitation is trivial. No evidence of tricuspid stenosis. Aortic Valve: The aortic valve is abnormal. Aortic valve regurgitation is mild to moderate. Mild aortic stenosis is present. Aortic valve mean gradient measures 17.0 mmHg. Aortic valve peak gradient measures 29.9 mmHg. Aortic valve area, by VTI measures 1.93 cm. Pulmonic Valve: The pulmonic valve was not well visualized. Pulmonic valve regurgitation is mild to moderate. No evidence of pulmonic stenosis. Aorta: The aortic root and ascending aorta are structurally normal, with no evidence of dilitation. Venous: The inferior vena cava is normal in size with greater than 50% respiratory variability, suggesting right atrial pressure of 3  mmHg. IAS/Shunts: The interatrial septum appears to be lipomatous. No atrial level shunt detected by color flow Doppler. Additional Comments: A device lead is visualized.  LEFT VENTRICLE PLAX 2D LVIDd:         4.40 cm   Diastology LVIDs:         2.40 cm   LV e' medial:    5.22 cm/s LV PW:         1.10 cm   LV E/e' medial:  28.5 LV  IVS:        1.30 cm   LV e' lateral:   7.51 cm/s LVOT diam:     2.00 cm   LV E/e' lateral: 19.8 LV SV:         101 LV SV Index:   60 LVOT Area:     3.14 cm                           3D Volume EF:                          3D EF:        62 %                          LV EDV:       138 ml                          LV ESV:       52 ml                          LV SV:        86 ml RIGHT VENTRICLE             IVC RV S prime:     13.10 cm/s  IVC diam: 1.25 cm TAPSE (M-mode): 2.2 cm LEFT ATRIUM             Index        RIGHT ATRIUM          Index LA diam:        3.70 cm 2.21 cm/m   RA Area:     9.49 cm LA Vol (A2C):   34.7 ml 20.72 ml/m  RA Volume:   17.00 ml 10.15 ml/m LA Vol (A4C):   62.1 ml 37.08 ml/m LA Biplane Vol: 49.1 ml 29.32 ml/m  AORTIC VALVE AV Area (Vmax):    1.92 cm AV Area (Vmean):   1.89 cm AV Area (VTI):     1.93 cm AV Vmax:           273.33 cm/s AV Vmean:          192.000 cm/s AV VTI:            0.524 m AV Peak Grad:      29.9 mmHg AV Mean Grad:      17.0 mmHg LVOT Vmax:         167.00 cm/s LVOT Vmean:        115.667 cm/s LVOT VTI:          0.322 m LVOT/AV VTI ratio: 0.61  AORTA Ao Root diam: 2.90 cm Ao Asc diam:  2.70 cm MITRAL VALVE                TRICUSPID VALVE MV Area (PHT): 4.89 cm     TR Peak grad:   26.6 mmHg MV Decel Time: 155 msec     TR Vmax:        258.00 cm/s MR Peak grad: 101.2 mmHg MR Mean grad: 84.0 mmHg     SHUNTS MR Vmax:      503.00 cm/s   Systemic VTI:  0.32 m MR Vmean:     436.0 cm/s    Systemic Diam: 2.00 cm MV E velocity: 149.00  cm/s MV A velocity: 118.00 cm/s MV E/A ratio:  1.26 Shannon LamMahesh Chandrasekhar MD Electronically signed by Shannon LamMahesh Chandrasekhar  MD Signature Date/Time: 02/19/2022/11:36:45 AM    Final    DG Abd Portable 1V  Result Date: 02/19/2022 CLINICAL DATA:  Check gastric catheter placement EXAM: PORTABLE ABDOMEN - 1 VIEW COMPARISON:  None Available. FINDINGS: Scattered large and small bowel gas is noted. Aortic stent graft is noted. Renal excretion of contrast is noted related to prior vascular procedure. Gastric catheter is noted with the tip in the stomach. Proximal side port however lies at the gastroesophageal junction. This should be advanced deeper into the stomach. IMPRESSION: Gastric catheter is described which should be advanced deeper into the stomach. Electronically Signed   By: Alcide CleverMark  Lukens M.D.   On: 02/19/2022 00:36   CT CEREBRAL PERFUSION W CONTRAST  Result Date: 02/18/2022 CLINICAL DATA:  Left facial droop EXAM: CT PERFUSION BRAIN TECHNIQUE: Multiphase CT imaging of the brain was performed following IV bolus contrast injection. Subsequent parametric perfusion maps were calculated using RAPID software. RADIATION DOSE REDUCTION: This exam was performed according to the departmental dose-optimization program which includes automated exposure control, adjustment of the mA and/or kV according to patient size and/or use of iterative reconstruction technique. CONTRAST:  40mL OMNIPAQUE IOHEXOL 350 MG/ML SOLN COMPARISON:  None Available. FINDINGS: CT Brain Perfusion Findings: CBF (<30%) Volume: 32mL Perfusion (Tmax>6.0s) volume: 37mL Mismatch Volume: 5mL ASPECTS on noncontrast CT Head: 10 at 7:47 p.m. today. Infarct Core: 32 mL Infarction Location:Anterior right MCA territory IMPRESSION: A 32 mL core infarct with 5 mL penumbra. Electronically Signed   By: Deatra RobinsonKevin  Herman M.D.   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 EXAM: CT ANGIOGRAPHY CHEST, ABDOMEN AND PELVIS TECHNIQUE: Non-contrast CT of the chest was  initially obtained. Multidetector CT imaging through the chest, abdomen and pelvis was performed using the standard protocol during bolus administration of intravenous contrast. Multiplanar reconstructed images and MIPs were obtained and reviewed to evaluate the vascular anatomy. RADIATION DOSE REDUCTION: This exam was performed according to the departmental dose-optimization program which includes automated exposure control, adjustment of the mA and/or kV according to patient size and/or use of iterative reconstruction technique. CONTRAST:  75mL OMNIPAQUE IOHEXOL 350 MG/ML SOLN COMPARISON:  December 15, 2021 FINDINGS: CTA CHEST FINDINGS Cardiovascular: A dual lead AICD is noted. There is marked severity calcification of the thoracic aorta. The distal aspect of the descending thoracic aorta is tortuous in appearance and measures approximately 3.1 cm x 2.8 cm. Satisfactory opacification of the pulmonary arteries to the segmental level. No evidence of pulmonary embolism. Normal heart size with marked severity coronary artery calcification. No pericardial effusion. Mediastinum/Nodes: No enlarged mediastinal, hilar, or axillary lymph nodes. Thyroid gland, trachea, and esophagus demonstrate no significant findings. Lungs/Pleura: Mild, bilateral atelectatic changes are seen without evidence of acute infiltrate, pleural effusion or pneumothorax. Musculoskeletal: No chest wall abnormality. No acute or significant osseous findings. Review of the MIP images confirms the above findings. CTA ABDOMEN AND PELVIS FINDINGS VASCULAR Aorta: Patent aortoiliac stent graft, without evidence of stent occlusion or stent leak. Celiac: Marked severity calcification without evidence of aneurysm, dissection, vasculitis or significant stenosis. SMA: Marked severity calcification without evidence of aneurysm, dissection, vasculitis or significant stenosis. Renals: Both renal arteries are patent without evidence of aneurysm, dissection,  vasculitis, fibromuscular dysplasia or significant stenosis. IMA: Patent without evidence of aneurysm, dissection, vasculitis  or significant stenosis. 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 Veins: No obvious venous abnormality within the limitations of this arterial phase study. Review of the MIP images confirms the above findings. NON-VASCULAR Hepatobiliary: No focal liver abnormality is seen. No gallstones or gallbladder wall thickening. The common bile duct measures 11.6 mm in diameter. Pancreas: There is mild pancreatic ductal dilatation without evidence of surrounding inflammatory changes. Spleen: A 3.1 cm x 2.1 cm cystic appearing area is seen within the anterior aspect of the spleen. Adrenals/Urinary Tract: There is diffuse bilateral adrenal gland enlargement, left greater than right. Kidneys are normal in size, without renal calculi or hydronephrosis. An 8 mm diameter simple cyst is seen within the anterolateral aspect of the mid left kidney. Bladder is unremarkable. A 4.8 cm x 4.0 cm cystic appearing structure is seen adjacent to the anterolateral aspect of the urinary bladder on the left. This represents a new finding when compared to the prior study. Stomach/Bowel: Stomach is within normal limits. Appendix appears normal. No evidence of bowel wall thickening, distention, or inflammatory changes. Noninflamed diverticula are seen throughout the descending and sigmoid colon. Lymphatic: No abnormal abdominal or pelvic lymph nodes are identified. Reproductive: Status post hysterectomy. Other: A 2.1 cm x 1.4 cm fat containing ventral hernia is noted along the mid abdomen. No abdominopelvic ascites. Musculoskeletal: Multilevel degenerative changes seen throughout the lumbar spine. Review of the MIP images confirms the above findings. IMPRESSION: 1. No evidence of  pulmonary embolism or other acute intrathoracic process. 2. Patent aortoiliac stent graft without evidence of stent occlusion or stent leak. 3. 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 4. Colonic diverticulosis. 5. Cystic appearing area adjacent to the anterolateral aspect of the urinary bladder on the left, which represents a new finding when compared to the prior study. This may represent a large bladder diverticulum. 6. Stable splenic cyst. 7. Small fat containing ventral hernia along the mid abdomen. 8. Aortic atherosclerosis. Aortic Atherosclerosis (ICD10-I70.0). Electronically Signed   By: Aram Candela M.D.   On: 02/18/2022 20:25   CT HEAD CODE STROKE WO CONTRAST  Result Date: 02/18/2022 CLINICAL DATA:  Left facial droop and right gaze deviation EXAM: CT HEAD WITHOUT CONTRAST CT ANGIOGRAPHY OF THE HEAD AND NECK TECHNIQUE: Contiguous axial images were obtained from the base of the skull through the vertex without intravenous contrast. Multidetector CT imaging of the head and neck was performed using the standard protocol during bolus administration of intravenous contrast. Multiplanar CT image reconstructions and MIPs were obtained to evaluate the vascular anatomy. Carotid stenosis measurements (when applicable) are obtained utilizing NASCET criteria, using the distal internal carotid diameter as the denominator. RADIATION DOSE REDUCTION: This exam was performed according to the departmental dose-optimization program which includes automated exposure control, adjustment of the mA and/or kV according to patient size and/or use of iterative reconstruction technique. CONTRAST:  73mL OMNIPAQUE IOHEXOL 350 MG/ML SOLN COMPARISON:  12/09/2021 head CT FINDINGS: CT HEAD Brain: No acute intracranial hemorrhage. 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 There is  periventricular hypoattenuation compatible with chronic microvascular disease. Generalized volume loss. Vascular: Atherosclerosis of the internal carotid arteries at the skull base. Skull: Normal. Sinuses/Orbits: Bilateral posterior ethmoid air cell mucosal thickening. Orbits are normal. ASPECTS [facility] Stroke Program Early  CT Score) - Ganglionic level infarction (caudate, lentiform nuclei, internal capsule, insula, M1-M3 cortex): 7 - Supraganglionic infarction (M4-M6 cortex): 3 Total score (0-10 with 10 being normal): 10 Review of the MIP images confirms the above findings CTA NECK Aortic arch: Aortic atherosclerosis.  Normal branching pattern. Right carotid system: Atherosclerosis throughout the course of the right common carotid artery. The bifurcation is widely patent. No hemodynamically significant stenosis of the right ICA. Left carotid system: Common carotid atherosclerosis without stenosis. Atherosclerotic calcification at the bifurcation extending into the ICA resulting in 60% stenosis. The distal ICA is widely patent. Vertebral arteries:Left dominant. Multifocal atherosclerosis along the course of both vertebral arteries resulting in mild stenosis of the left mid V2 segment head multifocal severe stenosis of the right P2 segment. Both vertebral arteries remain patent to the skull base. Skeleton: Negative Other neck: Unremarkable. Upper chest: Clear CTA HEAD Anterior circulation: There is calcific atherosclerosis of both internal carotid arteries at the skull base with mild stenosis bilaterally. The anterior cerebral arteries are normal. The right MCA is patent proximally in the M1 segment is normal. There is an early right bifurcation. There is occlusion of a M2 superior division branch in the anterior sylvian fissure (series 8, image 109). The left MCA is normal. Posterior circulation: Both P comms are patent. There is atherosclerotic irregularity of the basilar artery with moderate stenosis of the  midportion. The diminutive right vertebral artery terminates in PICA. Both PCAs are patent. There is mild atherosclerotic irregularity bilaterally. Venous sinuses: Normal allowing for venous dominant contrast phase. Anatomic variants: Bilateral fetal origins of the posterior cerebral arteries. Delayed phase: None IMPRESSION: 1. Occlusion of a right M2 superior division branch in the anterior Sylvian fissure. No other intracranial occlusion. 2. No acute intracranial hemorrhage. ASPECTS is 10. 3. 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 4. 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 5. 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 Aortic Atherosclerosis (ICD10-I70.0). These results were called by telephone at the time of interpretation on 02/18/2022 at 8:10 pm to provider [facility] , who verbally acknowledged these results. Electronically Signed   By: Ulyses Jarred M.D.   On: 02/18/2022 20:23   CT ANGIO HEAD NECK W WO CM (CODE STROKE)  Result Date: 02/18/2022 CLINICAL DATA:  Left facial droop and right gaze deviation EXAM: CT HEAD WITHOUT CONTRAST CT ANGIOGRAPHY OF THE HEAD AND NECK TECHNIQUE: Contiguous axial images were obtained from the base of the skull through the vertex without intravenous contrast. Multidetector CT imaging of the head and neck was performed using the standard protocol during bolus administration of intravenous contrast. Multiplanar CT image reconstructions and MIPs were obtained to evaluate the vascular anatomy. Carotid stenosis measurements (when applicable) are obtained utilizing NASCET criteria, using the distal internal carotid diameter as the denominator. RADIATION DOSE REDUCTION: This exam was performed according to the departmental dose-optimization program which includes automated exposure control, adjustment of the mA and/or kV according to patient size and/or use of iterative reconstruction technique. CONTRAST:  72mL  OMNIPAQUE IOHEXOL 350 MG/ML SOLN COMPARISON:  12/09/2021 head CT FINDINGS: CT HEAD Brain: No acute intracranial hemorrhage. 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 There is periventricular hypoattenuation compatible with chronic microvascular disease. Generalized volume loss. Vascular: Atherosclerosis of the internal carotid arteries at the skull base. Skull: Normal. Sinuses/Orbits: Bilateral posterior ethmoid air cell mucosal thickening. Orbits are normal. ASPECTS [facility] Stroke Program Early CT Score) - Ganglionic level  infarction (caudate, lentiform nuclei, internal capsule, insula, M1-M3 cortex): 7 - Supraganglionic infarction (M4-M6 cortex): 3 Total score (0-10 with 10 being normal): 10 Review of the MIP images confirms the above findings CTA NECK Aortic arch: Aortic atherosclerosis.  Normal branching pattern. Right carotid system: Atherosclerosis throughout the course of the right common carotid artery. The bifurcation is widely patent. No hemodynamically significant stenosis of the right ICA. Left carotid system: Common carotid atherosclerosis without stenosis. Atherosclerotic calcification at the bifurcation extending into the ICA resulting in 60% stenosis. The distal ICA is widely patent. Vertebral arteries:Left dominant. Multifocal atherosclerosis along the course of both vertebral arteries resulting in mild stenosis of the left mid V2 segment head multifocal severe stenosis of the right P2 segment. Both vertebral arteries remain patent to the skull base. Skeleton: Negative Other neck: Unremarkable. Upper chest: Clear CTA HEAD Anterior circulation: There is calcific atherosclerosis of both internal carotid arteries at the skull base with mild stenosis bilaterally. The anterior cerebral arteries are normal. The right MCA is patent proximally in the M1 segment is normal. There is an early right bifurcation. There is occlusion of a M2 superior division branch in the anterior sylvian fissure  (series 8, image 109). The left MCA is normal. Posterior circulation: Both P comms are patent. There is atherosclerotic irregularity of the basilar artery with moderate stenosis of the midportion. The diminutive right vertebral artery terminates in PICA. Both PCAs are patent. There is mild atherosclerotic irregularity bilaterally. Venous sinuses: Normal allowing for venous dominant contrast phase. Anatomic variants: Bilateral fetal origins of the posterior cerebral arteries. Delayed phase: None IMPRESSION: 1. Occlusion of a right M2 superior division branch in the anterior Sylvian fissure. No other intracranial occlusion. 2. No acute intracranial hemorrhage. ASPECTS is 10. 3. 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 4. 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 5. 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 Aortic Atherosclerosis (ICD10-I70.0). These results were called by telephone at the time of interpretation on 02/18/2022 at 8:10 pm to [facility] , who verbally acknowledged these results. Electronically Signed   By: Deatra RobinsonKevin  Herman M.D.   On: 02/18/2022 20:23    PHYSICAL EXAM General: Intubated elderly African-American lady   in no acute distress Respiratory: Respirations synchronous with ventilator Neurological (with propofol off): Eyes closed.  Does not respond to verbal stimuli.  Pupils equal round and reactive to light, right gaze deviation and will not cross midline., does not blink to threat on the left, good antigravity strength in right upper extremity with nonpurposeful movement, no withdrawal in left upper extremity.  Withdraws both lower extremities to noxious stimuli, withdraws left upper extremity to noxious stimuli  ASSESSMENT/PLAN Shannon Chen is a 77 y.o. 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  TNK was not offered due to low platelet count, and mechanical thrombectomy was performed.  TICI2B flow was achieved in the right MCA, however procedure was complicated by vessel rupture requiring vessel sacrifice with coil embolization.  Patient remained intubated after procedure and was transferred to the ICU.  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  Multiple old infarcts, chronic microvascular disease ASPECTS 10.    CTA head & neck occlusion of right M2 superior division branch, 60% stenosis of left ICA, severe stenosis of right V2 segment, moderate stenosis of basilar artery MRI pending 2D Echo EF 70 to 75%, grade 2 diastolic dysfunction, normal left atrial size, no atrial level shunt LDL 31 HgbA1c 6.4 VTE prophylaxis -Lovenox    Diet   Diet NPO time specified   aspirin 81 mg daily and clopidogrel 75 mg daily prior to admission, now on No antithrombotic.  Pending MRI results Therapy recommendations: Pending Disposition: Pending  Hypertension Home meds: Norvasc 2.5 mg daily Stable Keep systolic blood pressure 120-140 for first 24 hours, then less than 180 Long-term BP goal normotensive  Hyperlipidemia Home meds: Rosuvastatin 40 mg daily, resumed in hospital LDL 31, goal < 70 Continue statin at discharge  Diabetes type II Controlled Home meds: [place] Grandchild [employer] 10 mg daily HgbA1c 6.4, goal < 7.0 CBGs Recent Labs    02/19/22 0348 02/19/22 0748 02/19/22 1140  GLUCAP 147* 120* 117*    SSI  Respiratory failure Patient left intubated after  procedure Ventilator management per CCM Will attempt extubation after MRI today  Other Stroke Risk Factors Advanced Age >/= 70  Former cigarette smoker Hx stroke Coronary artery disease  Other Active Problems None  Hospital day # 1  Cortney E Ernestina Columbia , MSN, AGACNP-BC [facility] See Amion for schedule and pager information 02/19/2022 12:11 PM   STROKE MD NOTE :  I have personally obtained history,examined this patient, reviewed notes, independently viewed imaging studies, participated in medical decision making and plan of care.ROS completed by me personally and pertinent positives fully documented  I have made any additions or clarifications directly to the above note. Agree with note above.  Patient presented with right hemispheric infarct due to right M2/MCA occlusion and was not given thrombolysis due to low platelet count.  She underwent mechanical thrombectomy with partial recanalization and procedure complicated by vessel rupture requiring vessel sacrifice with embolization.  Her neurological exams are significant left hemiplegia.  Recommend close neurological observation and strict blood pressure control as per post thrombectomy protocol.  Check MRI scan of the brain later today.  Will discuss with family prognosis and goals of care.  Discussed with Dr. Sherlon Handing interventional neuroradiology pulmonary critical care team. This patient is critically ill and at significant risk of neurological worsening, death and care requires constant monitoring of vital signs, hemodynamics,respiratory and cardiac monitoring, extensive review of multiple databases, frequent neurological assessment, discussion with family, other specialists and medical decision making of high complexity.I have made any additions or clarifications directly to the above note.This critical care time does not reflect procedure time, or teaching time or supervisory time of PA/NP/Med Resident etc but could  involve care discussion time.  I spent 40 minutes of neurocritical care time  in the care of  this patient.     Delia Heady, MD Medical Director [facility] Pager: [phone] 02/19/2022 2:53 PM   To contact Stroke Continuity provider, please refer to [email or web address]. After hours, contact General Neurology

## 2022-02-19 NOTE — Progress Notes (Signed)
eLink Physician-Brief Progress Note Patient Name: NANETTE WIRSING DOB: March 30, 1945 MRN: [id number]   Date of Service  02/19/2022  HPI/Events of Note  Hypotension - Patient is on a Phenylephrine IV infusion via PIV. No order for Phenylephrine IV infusion.   eICU Interventions  Plan: Phenylephrine IV infusion via PIV. Titrate to MAP >= 65.      Intervention Category Major Interventions: Hypotension - evaluation and management  Tirzah Fross [place] 02/19/2022, 3:22 AM

## 2022-02-19 NOTE — Progress Notes (Signed)
  Transition of Care [facility]) Screening Note   Patient Details  Name: CYNIA ABRUZZO Date of Birth: 1945-06-25   Transition of Care [facility]) CM/SW Contact:    Tom-Johnson, Renea Ee, RN Phone Number: 02/19/2022, 1:32 PM  Patient is admitted for Acute Right MCA Stroke with Left facial droop. Underwent Cerebral Angiogram Mechanical Thrombectomy for Rt MCA Occlusion on 02/18/22 by Interventional Neuroradiology. Patient is currently Intubated.   Transition of Care Department [facility]) has reviewed patient and no TOC needs or recommendations have been identified at this time. TOC will continue to monitor patient advancement through interdisciplinary progression rounds. If new patient transition needs arise, please place a TOC consult.

## 2022-02-19 NOTE — Progress Notes (Signed)
eLink Physician-Brief Progress Note Patient Name: Shannon Chen DOB: 10-13-1945 MRN: [id number]   Date of Service  02/19/2022  HPI/Events of Note  Nursing request for AM labs.  eICU Interventions  Will order CBC with platelets, BMP and Mg++ level now.      Intervention Category Major Interventions: Other:  [place] Perry Cornelia Copa 02/19/2022, 6:00 AM

## 2022-02-20 ENCOUNTER — Ambulatory Visit: Payer: Medicare HMO | Admitting: Neurology

## 2022-02-20 DIAGNOSIS — I63511 Cerebral infarction due to unspecified occlusion or stenosis of right middle cerebral artery: Secondary | ICD-10-CM | POA: Diagnosis not present

## 2022-02-20 DIAGNOSIS — I609 Nontraumatic subarachnoid hemorrhage, unspecified: Secondary | ICD-10-CM

## 2022-02-20 LAB — BASIC METABOLIC PANEL
Anion gap: 9 (ref 5–15)
BUN: 31 mg/dL — ABNORMAL HIGH (ref 8–23)
CO2: 17 mmol/L — ABNORMAL LOW (ref 22–32)
Calcium: 7.8 mg/dL — ABNORMAL LOW (ref 8.9–10.3)
Chloride: 117 mmol/L — ABNORMAL HIGH (ref 98–111)
Creatinine, Ser: 0.92 mg/dL (ref 0.44–1.00)
GFR, Estimated: >60 mL/min (ref >60)
Glucose, Bld: 158 mg/dL — ABNORMAL HIGH (ref 70–99)
Potassium: 3.7 mmol/L (ref 3.5–5.1)
Sodium: 143 mmol/L (ref 135–145)

## 2022-02-20 LAB — MAGNESIUM
Magnesium: 1.9 mg/dL (ref 1.7–2.4)
Magnesium: 2 mg/dL (ref 1.7–2.4)

## 2022-02-20 LAB — CBC
HCT: 30.8 % — ABNORMAL LOW (ref 36.0–46.0)
Hemoglobin: 9.9 g/dL — ABNORMAL LOW (ref 12.0–15.0)
MCH: 28.7 pg (ref 26.0–34.0)
MCHC: 32.1 g/dL (ref 30.0–36.0)
MCV: 89.3 fL (ref 80.0–100.0)
Platelets: 122 10*3/uL — ABNORMAL LOW (ref 150–400)
RBC: 3.45 MIL/uL — ABNORMAL LOW (ref 3.87–5.11)
RDW: 15 % (ref 11.5–15.5)
WBC: 9.6 10*3/uL (ref 4.0–10.5)
nRBC: 0 % (ref 0.0–0.2)

## 2022-02-20 LAB — GLUCOSE, CAPILLARY
Glucose-Capillary: 156 mg/dL — ABNORMAL HIGH (ref 70–99)
Glucose-Capillary: 145 mg/dL — ABNORMAL HIGH (ref 70–99)
Glucose-Capillary: 158 mg/dL — ABNORMAL HIGH (ref 70–99)
Glucose-Capillary: 93 mg/dL (ref 70–99)
Glucose-Capillary: 109 mg/dL — ABNORMAL HIGH (ref 70–99)
Glucose-Capillary: 126 mg/dL — ABNORMAL HIGH (ref 70–99)

## 2022-02-20 LAB — TRIGLYCERIDES: Triglycerides: 140 mg/dL (ref <150)

## 2022-02-20 LAB — PHOSPHORUS
Phosphorus: 3.3 mg/dL (ref 2.5–4.6)
Phosphorus: 2.8 mg/dL (ref 2.5–4.6)

## 2022-02-20 MED ORDER — METOPROLOL SUCCINATE ER 50 MG PO TB24: 50.0000 mg | ORAL_TABLET | Freq: Every day | ORAL | Status: DC

## 2022-02-20 MED ORDER — HYDRALAZINE HCL 20 MG/ML IJ SOLN: 10.0000 mg | INTRAMUSCULAR | Status: DC | PRN

## 2022-02-20 MED ORDER — AMLODIPINE BESYLATE 2.5 MG PO TABS
2.5000 mg | ORAL_TABLET | Freq: Every day | ORAL | Status: DC
  Administered 2022-02-20 – 2022-02-25 (×6): 2.5 mg
  Filled 2022-02-20 (×6): qty 1

## 2022-02-20 MED ORDER — LABETALOL HCL 5 MG/ML IV SOLN
20.0000 mg | INTRAVENOUS | Status: DC | PRN
  Administered 2022-02-24 – 2022-02-25 (×2): 20 mg via INTRAVENOUS
  Filled 2022-02-20 (×2): qty 4

## 2022-02-20 MED ORDER — BUDESONIDE 0.25 MG/2ML IN SUSP
0.2500 mg | Freq: Two times a day (BID) | RESPIRATORY_TRACT | Status: DC
  Administered 2022-02-20 – 2022-02-25 (×9): 0.25 mg via RESPIRATORY_TRACT
  Filled 2022-02-20 (×10): qty 2

## 2022-02-20 MED ORDER — METOPROLOL TARTRATE 25 MG PO TABS
25.0000 mg | ORAL_TABLET | Freq: Two times a day (BID) | ORAL | Status: DC
  Administered 2022-02-20 – 2022-02-25 (×11): 25 mg
  Filled 2022-02-20 (×11): qty 1

## 2022-02-20 MED ORDER — POTASSIUM CHLORIDE 20 MEQ PO PACK
40.0000 meq | PACK | Freq: Once | ORAL | Status: AC
  Administered 2022-02-20: 40 meq
  Filled 2022-02-20: qty 2

## 2022-02-20 NOTE — Progress Notes (Signed)
Referring Physician(s): Dr. Antony Contras  Supervising Physician: Pedro Earls  Patient Status:  [facility] - In-pt  Chief Complaint: Code Stroke  Subjective: Intubated, arouses with assessment, but not fully alert.   Allergies: Patient has no known allergies.  Medications: Prior to Admission medications   Medication Sig Start Date End Date Taking? Authorizing Provider  acetaminophen (TYLENOL) 325 MG tablet Take 650 mg by mouth every 4 (four) hours as needed for mild pain or moderate pain.   Yes [provider]  albuterol (VENTOLIN HFA) 108 (90 Base) MCG/ACT inhaler Inhale 1-2 puffs into the lungs every 6 (six) hours as needed for wheezing or shortness of breath. 11/13/20  Yes [provider]  alum & mag hydroxide-simeth (MAALOX/MYLANTA) 200-200-20 MG/5ML suspension Take 15 mLs by mouth every 6 (six) hours as needed for flatulence. 12/23/21  Yes Fritzi Mandes, MD  Amino Acids-Protein Hydrolys (FEEDING SUPPLEMENT, PRO-STAT 64,) LIQD Take 30 mLs by mouth in the morning and at bedtime.   Yes [provider]  amLODipine (NORVASC) 2.5 MG tablet Take 2.5 mg by mouth daily. 01/08/21  Yes Minna Merritts, MD  aspirin EC 81 MG EC tablet Take 1 tablet (81 mg total) by mouth daily. Swallow whole. 11/09/19  Yes Wouk, Ailene Rud, MD  calcium carbonate (OS-CAL - DOSED IN MG OF ELEMENTAL CALCIUM) 1250 (500 Ca) MG tablet Take 1 tablet (1,250 mg total) by mouth 2 (two) times daily with a meal. 12/23/21  Yes Fritzi Mandes, MD  clopidogrel (PLAVIX) 75 MG tablet Take 75 mg by mouth daily. 03/29/21  Yes [provider]  cyanocobalamin 1000 MCG tablet Take 1 tablet (1,000 mcg total) by mouth daily. 12/24/21  Yes Fritzi Mandes, MD  dapagliflozin propanediol (FARXIGA) 10 MG TABS tablet Take 10 mg by mouth daily.   Yes [provider]  folic acid (FOLVITE) 1 MG tablet Take 1 tablet (1 mg total) by mouth daily. 12/24/21  Yes Fritzi Mandes, MD  KERENDIA 20 MG TABS  Take 20 mg by mouth daily. 12/04/21  Yes [provider]  Menthol, Topical Analgesic, (BIOFREEZE) 4 % GEL Apply 1 Application topically in the morning, at noon, and at bedtime.   Yes [provider]  metoprolol succinate (TOPROL-XL) 50 MG 24 hr tablet Take 1 tablet (50 mg total) by mouth daily. 12/16/20 02/19/22 Yes Val Riles, MD  mirtazapine (REMERON) 7.5 MG tablet Take 7.5 mg by mouth daily. 12/04/20  Yes [provider]  Multiple Vitamin (MULTIVITAMIN WITH MINERALS) TABS tablet Take 1 tablet by mouth daily. 12/24/21  Yes Fritzi Mandes, MD  oxyCODONE-acetaminophen (PERCOCET/ROXICET) 5-325 MG tablet Take 1 tablet by mouth every 6 (six) hours as needed for moderate pain. 12/23/21  Yes Fritzi Mandes, MD  pantoprazole (PROTONIX) 40 MG tablet Take 1 tablet (40 mg total) by mouth daily. 12/24/21  Yes Fritzi Mandes, MD  polyethylene glycol (MIRALAX) 17 g packet Take 17 g by mouth daily as needed for mild constipation. 11/28/19  Yes Loel Dubonnet, NP  potassium chloride (KLOR-CON) 10 MEQ tablet Take 10 mEq by mouth daily. 03/29/21  Yes [provider]  predniSONE (DELTASONE) 20 MG tablet Take 3 tablets (60 mg total) by mouth daily with breakfast. Take it till you are seen by Rheumatology Patient taking differently: Take 10 mg by mouth daily with breakfast. Take 0.5 tablet everyday for 14 days. 12/24/21  Yes Fritzi Mandes, MD  rosuvastatin (CRESTOR) 40 MG tablet TAKE 1 TABLET BY MOUTH DAILY 12/04/20  Yes Gollan,  Kathlene November, MD  TRELEGY ELLIPTA 200-62.5-25 MCG/ACT AEPB Inhale 1 puff into the lungs daily. 12/07/20  Yes [provider]  Vibegron (GEMTESA) 75 MG TABS Take 75 mg by mouth daily.   Yes [provider]  Vitamin D, Ergocalciferol, (DRISDOL) 1.25 MG (50000 UNIT) CAPS capsule Take 1 capsule (50,000 Units total) by mouth every 7 (seven) days. 12/23/21  Yes Fritzi Mandes, MD  cephALEXin (KEFLEX) 500 MG capsule Take 500 mg by mouth 2 (two) times daily. Patient  not taking: Reported on 02/19/2022 01/15/22   [provider]  feeding supplement (ENSURE ENLIVE / ENSURE PLUS) LIQD Take 237 mLs by mouth 3 (three) times daily between meals. Patient not taking: Reported on 02/19/2022 12/23/21   Fritzi Mandes, MD     Vital Signs: BP (!) 152/49   Pulse 91   Temp 99.8 F (37.7 C) (Axillary)   Resp 19   Wt 141 lb 12.1 oz (64.3 kg)   SpO2 100%   BMI 23.23 kg/m   Physical Exam Intubated on vent Neuro: significant drool/secretions, minimally arousable.  Does not participate in exam.  Skin: R radial procedure site intact.   Imaging: IR PERCUTANEOUS ART THROMBECTOMY/INFUSION INTRACRANIAL INC DIAG ANGIO  Result Date: 02/20/2022 INDICATION: 77 year old female presenting with sudden onset left-sided weakness, aphasia and right gaze deviation; NIHSS 22 (later improved to 18). 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 Head CT showed a large acute territory infarct or hemorrhage. CT angiogram of the head and neck showed a right M2/MCA occlusion. He was taken to our service for mechanical thrombectomy. EXAM: ULTRASOUND-GUIDED VASCULAR ACCESS DIAGNOSTIC CEREBRAL ANGIOGRAM MECHANICAL THROMBECTOMY TRANSCATHETER EMBOLIZATION FLAT PANEL HEAD CT COMPARISON:  CT/CT angiogram of the head and neck February 18, 2022. MEDICATIONS: No antibiotics utilized. ANESTHESIA/SEDATION: The procedure was performed under general anesthesia. CONTRAST:  110 mL of Omnipaque 300 milligram/mL. FLUOROSCOPY: Radiation Exposure Index (as provided by the fluoroscopic device): 4,401 mGy Kerma COMPLICATIONS: SIR LEVEL B - Normal therapy, includes overnight admission for observation. TECHNIQUE: Informed written consent was obtained from the  patient's niece after a thorough discussion of the procedural risks, benefits and alternatives. All questions were addressed. Maximal Sterile Barrier Technique was utilized including caps, mask, sterile gowns, sterile gloves, sterile drape, hand hygiene and skin antiseptic. A timeout was performed prior to the initiation of the procedure. Using the modified Seldinger technique and a micropuncture kit, access was gained to the right radial artery at the wrist and a 6 French sheath was placed. Real-time ultrasound guidance was utilized for vascular access including the acquisition of a permanent ultrasound image documenting patency of the accessed vessel. Slow intra arterial infusion of 200 mcg nitroglycerin diluted in patient's own blood was performed. No significant fluctuation in patient's blood pressure seen. Then, a right radial artery angiogram was obtained via sheath side port. Next, a 6 [place] benchmark catheter was navigated over a 0.035" Terumo Glidewire into the right subclavian artery under fluoroscopic guidance. Under fluoroscopy, the catheter was then advanced into the distal cervical segment of the right ICA. Frontal and lateral angiograms of the head were obtained. FINDINGS: 1. Normal brachial artery branching pattern seen. No significant anatomical variation. The right radial artery caliber is adequate for vascular access. Atherosclerotic changes are noted along the right ulnar artery without hemodynamically significant stenosis. 2. 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 3. Occlusion of a dominant right M2/MCA anterior division branch. Luminal irregularity of the M1 and proximal M2 segments of the right MCA, consistent with atherosclerotic disease. Severe stenosis at the proximal right A1/ACA. 4. Opacification of the right PCA vascular tree via a  prominent posterior communicating artery. Luminal irregularity noted along the left PCA, consistent with atherosclerotic disease with moderate stenosis at the distal P2 segment. PROCEDURE: Using biplane roadmap guidance, a 5 [place] Sof[place] aspiration catheter was navigated over a phenom 21 microcatheter and an Aristotle 14 microguidewire into the cavernous segment of the right ICA. The microcatheter was then navigated over the wire into the right M2/MCA anterior division branch. Then, a 3 x 20 mm solitaire stent retriever was deployed spanning the M2 segment. The device was allowed to intercalated with the clot for 4 minutes. The aspiration catheter was advanced to the M1 segment and connected to an aspiration pump. The thrombectomy device and aspiration catheter were removed under constant aspiration. The guide catheter was aspirated for debris. Right internal carotid artery angiograms with lateral views the showed partial recanalization of the right MCA (T2b) with 2 M3 branches to the perirolandic area occluded. Using biplane roadmap guidance, a 5 [place] Sof[place] aspiration catheter was navigated over a phenom 21 microcatheter and an Aristotle 14 microguidewire into the cavernous segment of the right ICA. Upon navigating microcatheter over the wire into the right M2/MCA unexpected deflection of the catheter was noted. Frontal and lateral angiograms with microcatheter contrast injection were performed showing contrast extravasation. Transcatheter embolization of injured vessel was performed with 3 detachable coils (2 x 3 mm target XL soft (x2) and 2 x 3 cm target Nano). Follow-up right internal carotid artery angiograms with frontal and lateral views of the head showed in significant decrease of contrast extravasation with mild persistent extravasation. The microcatheter was again navigated into the affected branch. Microcatheter contrast injection showed persistent contrast extravasation. Additional coil was placed  more proximally (1.5 x 3 mm target 360 nano coil) resulting in branch occlusion. Right internal carotid artery angiograms with frontal and lateral views of the head showed occlusion of the right ICA terminus with new clot formation. Using biplane roadmap guidance, a 5 [place] Sof[place] aspiration catheter was navigated over an Aristotle 24 microguidewire into the cavernous segment of the right ICA. The aspiration catheter was then advanced to the level of occlusion and connected to an aspiration pump. Continuous aspiration was performed for 2 minutes. The guide catheter was connected to a VacLok syringe. The aspiration catheter was subsequently removed under constant aspiration. The guide catheter was aspirated for debris. Right internal carotid artery angiograms with frontal and lateral views of the head showed complete recanalization of the right ICA and M1 segment. The posterior division remains patent. However, the anterior division is occluded. Given hemorrhagic complication from minimal catheter manipulation, decision was made to abort the procedure. Flat panel CT of the head was obtained and post processed in a separate workstation with concurrent attending physician supervision. Selected images were sent to PACS. Expected right sylvian fissure contrast extravasation. The catheter was subsequently withdrawn. An inflatable band was placed and inflated over the right wrist access site. The vascular sheath was withdrawn and the band was slowly deflated until brisk flow was noted through the arteriotomy site. At this point, the band was reinflated with additional 4 cc  of air to obtain patent hemostasis. IMPRESSION: Mechanical thrombectomy performed for treatment of a dominant right Z6/XWR occlusion complicated by vessel perforation treated with detachable coils. Given need for vessel sacrifice, final MCA status is TICI 2A, similar to the beginning of the procedure. PLAN: Refer to EMR. Electronically Signed   By:  Pedro Earls M.D.   On: 02/20/2022 13:02   IR US Guide Vasc Access Right  Result Date: 02/20/2022 INDICATION: 77 year old female presenting with sudden onset left-sided weakness, aphasia and right gaze deviation; NIHSS 22 (later improved to 18). 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 Head CT showed a large acute territory infarct or hemorrhage. CT angiogram of the head and neck showed a right M2/MCA occlusion. He was taken to our service for mechanical thrombectomy. EXAM: ULTRASOUND-GUIDED VASCULAR ACCESS DIAGNOSTIC CEREBRAL ANGIOGRAM MECHANICAL THROMBECTOMY TRANSCATHETER EMBOLIZATION FLAT PANEL HEAD CT COMPARISON:  CT/CT angiogram of the head and neck February 18, 2022. MEDICATIONS: No antibiotics utilized. ANESTHESIA/SEDATION: The procedure was performed under general anesthesia. CONTRAST:  110 mL of Omnipaque 300 milligram/mL. FLUOROSCOPY: Radiation Exposure Index (as provided by the fluoroscopic device): 6,045 mGy Kerma COMPLICATIONS: SIR LEVEL B - Normal therapy, includes overnight admission for observation. TECHNIQUE: Informed written consent was obtained from the patient's niece after a thorough discussion of the procedural risks, benefits and alternatives. All questions were addressed. Maximal Sterile Barrier Technique was utilized including caps, mask, sterile gowns, sterile gloves, sterile drape, hand hygiene and skin antiseptic. A timeout was performed prior to the initiation of the procedure. Using the modified Seldinger technique and a micropuncture kit, access was gained to the right radial artery at the wrist and a 6 French sheath was placed. Real-time ultrasound guidance was utilized for vascular access including  the acquisition of a permanent ultrasound image documenting patency of the accessed vessel. Slow intra arterial infusion of 200 mcg nitroglycerin diluted in patient's own blood was performed. No significant fluctuation in patient's blood pressure seen. Then, a right radial artery angiogram was obtained via sheath side port. Next, a 6 [place] benchmark catheter was navigated over a 0.035" Terumo Glidewire into the right subclavian artery under fluoroscopic guidance. Under fluoroscopy, the catheter was then advanced into the distal cervical segment of the right ICA. Frontal and lateral angiograms of the head were obtained. FINDINGS: 1. Normal brachial artery branching pattern seen. No significant anatomical variation. The right radial artery caliber is adequate for vascular access. Atherosclerotic changes are noted along the right ulnar artery without hemodynamically significant stenosis. 2. 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 3. Occlusion of a dominant right M2/MCA anterior division branch. Luminal irregularity of the M1 and proximal M2 segments of the right MCA, consistent with atherosclerotic disease. Severe stenosis at the proximal right A1/ACA. 4. Opacification of the right PCA vascular tree via a prominent posterior communicating artery. Luminal irregularity noted along the left PCA, consistent with atherosclerotic disease with moderate stenosis at the distal P2 segment. PROCEDURE: Using biplane roadmap guidance, a 5 [place] Sof[place] aspiration catheter was navigated over a phenom 21 microcatheter and an Aristotle 14 microguidewire into the cavernous segment of the right ICA. The microcatheter was then navigated over the wire into the  right M2/MCA anterior division branch. Then, a 3 x 20 mm solitaire stent retriever was deployed spanning the M2 segment. The device  was allowed to intercalated with the clot for 4 minutes. The aspiration catheter was advanced to the M1 segment and connected to an aspiration pump. The thrombectomy device and aspiration catheter were removed under constant aspiration. The guide catheter was aspirated for debris. Right internal carotid artery angiograms with lateral views the showed partial recanalization of the right MCA (T2b) with 2 M3 branches to the perirolandic area occluded. Using biplane roadmap guidance, a 5 [place] aspiration catheter was navigated over a phenom 21 microcatheter and an Aristotle 14 microguidewire into the cavernous segment of the right ICA. Upon navigating microcatheter over the wire into the right M2/MCA unexpected deflection of the catheter was noted. Frontal and lateral angiograms with microcatheter contrast injection were performed showing contrast extravasation. Transcatheter embolization of injured vessel was performed with 3 detachable coils (2 x 3 mm target XL soft (x2) and 2 x 3 cm target Nano). Follow-up right internal carotid artery angiograms with frontal and lateral views of the head showed in significant decrease of contrast extravasation with mild persistent extravasation. The microcatheter was again navigated into the affected branch. Microcatheter contrast injection showed persistent contrast extravasation. Additional coil was placed more proximally (1.5 x 3 mm target 360 nano coil) resulting in branch occlusion. Right internal carotid artery angiograms with frontal and lateral views of the head showed occlusion of the right ICA terminus with new clot formation. Using biplane roadmap guidance, a 5 [place] Sof[place] aspiration catheter was navigated over an Aristotle 24 microguidewire into the cavernous segment of the right ICA. The aspiration catheter was then advanced to the level of occlusion and connected to an aspiration pump. Continuous aspiration was performed for 2 minutes. The guide catheter was  connected to a VacLok syringe. The aspiration catheter was subsequently removed under constant aspiration. The guide catheter was aspirated for debris. Right internal carotid artery angiograms with frontal and lateral views of the head showed complete recanalization of the right ICA and M1 segment. The posterior division remains patent. However, the anterior division is occluded. Given hemorrhagic complication from minimal catheter manipulation, decision was made to abort the procedure. Flat panel CT of the head was obtained and post processed in a separate workstation with concurrent attending physician supervision. Selected images were sent to PACS. Expected right sylvian fissure contrast extravasation. The catheter was subsequently withdrawn. An inflatable band was placed and inflated over the right wrist access site. The vascular sheath was withdrawn and the band was slowly deflated until brisk flow was noted through the arteriotomy site. At this point, the band was reinflated with additional 4 cc of air to obtain patent hemostasis. IMPRESSION: Mechanical thrombectomy performed for treatment of a dominant right C5/ENI occlusion complicated by vessel perforation treated with detachable coils. Given need for vessel sacrifice, final MCA status is TICI 2A, similar to the beginning of the procedure. PLAN: Refer to EMR. Electronically Signed   By: Pedro Earls M.D.   On: 02/20/2022 13:02   IR Angiogram Follow Up Study  Result Date: 02/20/2022 INDICATION: 77 year old female presenting with sudden onset left-sided weakness, aphasia and right gaze deviation; NIHSS 22 (later improved to 18). 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  Head CT showed a large acute territory infarct or hemorrhage. CT angiogram of the head and neck showed a right M2/MCA occlusion. He was taken to our service for mechanical thrombectomy. EXAM: ULTRASOUND-GUIDED VASCULAR ACCESS DIAGNOSTIC CEREBRAL ANGIOGRAM MECHANICAL THROMBECTOMY TRANSCATHETER EMBOLIZATION FLAT PANEL HEAD CT COMPARISON:  CT/CT angiogram of the head and neck February 18, 2022. MEDICATIONS: No antibiotics utilized. ANESTHESIA/SEDATION: The procedure was performed under general anesthesia. CONTRAST:  110 mL of Omnipaque 300 milligram/mL. FLUOROSCOPY: Radiation Exposure Index (as provided by the fluoroscopic device): 9,622 mGy Kerma COMPLICATIONS: SIR LEVEL B - Normal therapy, includes overnight admission for observation. TECHNIQUE: Informed written consent was obtained from the patient's niece after a thorough discussion of the procedural risks, benefits and alternatives. All questions were addressed. Maximal Sterile Barrier Technique was utilized including caps, mask, sterile gowns, sterile gloves, sterile drape, hand hygiene and skin antiseptic. A timeout was performed prior to the initiation of the procedure. Using the modified Seldinger technique and a micropuncture kit, access was gained to the right radial artery at the wrist and a 6 French sheath was placed. Real-time ultrasound guidance was utilized for vascular access including the acquisition of a permanent ultrasound image documenting patency of the accessed vessel. Slow intra arterial infusion of 200 mcg nitroglycerin diluted in patient's own blood was performed. No significant fluctuation in patient's blood pressure seen. Then, a right radial artery angiogram was obtained via sheath side port. Next, a 6 [place] benchmark catheter was navigated over a 0.035" Terumo Glidewire into the right subclavian artery under fluoroscopic guidance. Under fluoroscopy, the catheter was then advanced into the distal cervical segment of the right ICA. Frontal  and lateral angiograms of the head were obtained. FINDINGS: 1. Normal brachial artery branching pattern seen. No significant anatomical variation. The right radial artery caliber is adequate for vascular access. Atherosclerotic changes are noted along the right ulnar artery without hemodynamically significant stenosis. 2. 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 3. Occlusion of a dominant right M2/MCA anterior division branch. Luminal irregularity of the M1 and proximal M2 segments of the right MCA, consistent with atherosclerotic disease. Severe stenosis at the proximal right A1/ACA. 4. Opacification of the right PCA vascular tree via a prominent posterior communicating artery. Luminal irregularity noted along the left PCA, consistent with atherosclerotic disease with moderate stenosis at the distal P2 segment. PROCEDURE: Using biplane roadmap guidance, a 5 [place] Sof[place] aspiration catheter was navigated over a phenom 21 microcatheter and an Aristotle 14 microguidewire into the cavernous segment of the right ICA. The microcatheter was then navigated over the wire into the right M2/MCA anterior division branch. Then, a 3 x 20 mm solitaire stent retriever was deployed spanning the M2 segment. The device was allowed to intercalated with the clot for 4 minutes. The aspiration catheter was advanced to the M1 segment and connected to an aspiration pump. The thrombectomy device and aspiration catheter were removed under constant aspiration. The guide catheter was aspirated for debris. Right internal carotid artery angiograms with lateral views the showed partial recanalization of the right MCA (T2b) with 2 M3 branches to the perirolandic area occluded. Using biplane roadmap guidance, a 5 [place] aspiration catheter was navigated over a phenom 21 microcatheter and an  Aristotle 14 microguidewire into the cavernous segment of the right ICA. Upon navigating microcatheter over the wire into the right M2/MCA unexpected deflection of the  catheter was noted. Frontal and lateral angiograms with microcatheter contrast injection were performed showing contrast extravasation. Transcatheter embolization of injured vessel was performed with 3 detachable coils (2 x 3 mm target XL soft (x2) and 2 x 3 cm target Nano). Follow-up right internal carotid artery angiograms with frontal and lateral views of the head showed in significant decrease of contrast extravasation with mild persistent extravasation. The microcatheter was again navigated into the affected branch. Microcatheter contrast injection showed persistent contrast extravasation. Additional coil was placed more proximally (1.5 x 3 mm target 360 nano coil) resulting in branch occlusion. Right internal carotid artery angiograms with frontal and lateral views of the head showed occlusion of the right ICA terminus with new clot formation. Using biplane roadmap guidance, a 5 [place] aspiration catheter was navigated over an Aristotle 24 microguidewire into the cavernous segment of the right ICA. The aspiration catheter was then advanced to the level of occlusion and connected to an aspiration pump. Continuous aspiration was performed for 2 minutes. The guide catheter was connected to a VacLok syringe. The aspiration catheter was subsequently removed under constant aspiration. The guide catheter was aspirated for debris. Right internal carotid artery angiograms with frontal and lateral views of the head showed complete recanalization of the right ICA and M1 segment. The posterior division remains patent. However, the anterior division is occluded. Given hemorrhagic complication from minimal catheter manipulation, decision was made to abort the procedure. Flat panel CT of the head was obtained and post processed in a separate workstation  with concurrent attending physician supervision. Selected images were sent to PACS. Expected right sylvian fissure contrast extravasation. The catheter was subsequently withdrawn. An inflatable band was placed and inflated over the right wrist access site. The vascular sheath was withdrawn and the band was slowly deflated until brisk flow was noted through the arteriotomy site. At this point, the band was reinflated with additional 4 cc of air to obtain patent hemostasis. IMPRESSION: Mechanical thrombectomy performed for treatment of a dominant right G5/XMI occlusion complicated by vessel perforation treated with detachable coils. Given need for vessel sacrifice, final MCA status is TICI 2A, similar to the beginning of the procedure. PLAN: Refer to EMR. Electronically Signed   By: Pedro Earls M.D.   On: 02/20/2022 13:02   IR Angiogram Follow Up Study  Result Date: 02/20/2022 INDICATION: 77 year old female presenting with sudden onset left-sided weakness, aphasia and right gaze deviation; NIHSS 22 (later improved to 18). 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 Head CT showed a large acute territory infarct or hemorrhage. CT angiogram of the head and neck showed a right M2/MCA occlusion. He was taken to our service for mechanical thrombectomy. EXAM: ULTRASOUND-GUIDED VASCULAR ACCESS DIAGNOSTIC CEREBRAL ANGIOGRAM MECHANICAL THROMBECTOMY TRANSCATHETER EMBOLIZATION FLAT PANEL HEAD CT COMPARISON:  CT/CT angiogram of the head and neck February 18, 2022. MEDICATIONS: No antibiotics utilized. ANESTHESIA/SEDATION: The procedure was performed under general anesthesia. CONTRAST:  110 mL of Omnipaque 300 milligram/mL. FLUOROSCOPY: Radiation  Exposure Index (as provided by the fluoroscopic device): 6,803 mGy Kerma COMPLICATIONS: SIR LEVEL B - Normal therapy, includes overnight admission for observation. TECHNIQUE: Informed written consent was obtained from the patient's niece after a thorough discussion of the procedural risks, benefits and alternatives. All questions were addressed. Maximal Sterile Barrier Technique was utilized  including caps, mask, sterile gowns, sterile gloves, sterile drape, hand hygiene and skin antiseptic. A timeout was performed prior to the initiation of the procedure. Using the modified Seldinger technique and a micropuncture kit, access was gained to the right radial artery at the wrist and a 6 French sheath was placed. Real-time ultrasound guidance was utilized for vascular access including the acquisition of a permanent ultrasound image documenting patency of the accessed vessel. Slow intra arterial infusion of 200 mcg nitroglycerin diluted in patient's own blood was performed. No significant fluctuation in patient's blood pressure seen. Then, a right radial artery angiogram was obtained via sheath side port. Next, a 6 [place] benchmark catheter was navigated over a 0.035" Terumo Glidewire into the right subclavian artery under fluoroscopic guidance. Under fluoroscopy, the catheter was then advanced into the distal cervical segment of the right ICA. Frontal and lateral angiograms of the head were obtained. FINDINGS: 1. Normal brachial artery branching pattern seen. No significant anatomical variation. The right radial artery caliber is adequate for vascular access. Atherosclerotic changes are noted along the right ulnar artery without hemodynamically significant stenosis. 2. 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 3. Occlusion of a dominant right M2/MCA anterior division branch.  Luminal irregularity of the M1 and proximal M2 segments of the right MCA, consistent with atherosclerotic disease. Severe stenosis at the proximal right A1/ACA. 4. Opacification of the right PCA vascular tree via a prominent posterior communicating artery. Luminal irregularity noted along the left PCA, consistent with atherosclerotic disease with moderate stenosis at the distal P2 segment. PROCEDURE: Using biplane roadmap guidance, a 5 [place] aspiration catheter was navigated over a phenom 21 microcatheter and an Aristotle 14 microguidewire into the cavernous segment of the right ICA. The microcatheter was then navigated over the wire into the right M2/MCA anterior division branch. Then, a 3 x 20 mm solitaire stent retriever was deployed spanning the M2 segment. The device was allowed to intercalated with the clot for 4 minutes. The aspiration catheter was advanced to the M1 segment and connected to an aspiration pump. The thrombectomy device and aspiration catheter were removed under constant aspiration. The guide catheter was aspirated for debris. Right internal carotid artery angiograms with lateral views the showed partial recanalization of the right MCA (T2b) with 2 M3 branches to the perirolandic area occluded. Using biplane roadmap guidance, a 5 [place] aspiration catheter was navigated over a phenom 21 microcatheter and an Aristotle 14 microguidewire into the cavernous segment of the right ICA. Upon navigating microcatheter over the wire into the right M2/MCA unexpected deflection of the catheter was noted. Frontal and lateral angiograms with microcatheter contrast injection were performed showing contrast extravasation. Transcatheter embolization of injured vessel was performed with 3 detachable coils (2 x 3 mm target XL soft (x2) and 2 x 3 cm target Nano). Follow-up right internal carotid artery angiograms with frontal and lateral views of the head showed in significant decrease of contrast  extravasation with mild persistent extravasation. The microcatheter was again navigated into the affected branch. Microcatheter contrast injection showed persistent contrast extravasation. Additional coil was placed more proximally (1.5 x 3 mm target 360 nano coil) resulting in branch occlusion. Right internal carotid artery angiograms with frontal and lateral views of the head showed occlusion of the right ICA terminus with new clot formation. Using biplane roadmap guidance, a 5 [place] aspiration catheter was  navigated over an Aristotle 24 microguidewire into the cavernous segment of the right ICA. The aspiration catheter was then advanced to the level of occlusion and connected to an aspiration pump. Continuous aspiration was performed for 2 minutes. The guide catheter was connected to a VacLok syringe. The aspiration catheter was subsequently removed under constant aspiration. The guide catheter was aspirated for debris. Right internal carotid artery angiograms with frontal and lateral views of the head showed complete recanalization of the right ICA and M1 segment. The posterior division remains patent. However, the anterior division is occluded. Given hemorrhagic complication from minimal catheter manipulation, decision was made to abort the procedure. Flat panel CT of the head was obtained and post processed in a separate workstation with concurrent attending physician supervision. Selected images were sent to PACS. Expected right sylvian fissure contrast extravasation. The catheter was subsequently withdrawn. An inflatable band was placed and inflated over the right wrist access site. The vascular sheath was withdrawn and the band was slowly deflated until brisk flow was noted through the arteriotomy site. At this point, the band was reinflated with additional 4 cc of air to obtain patent hemostasis. IMPRESSION: Mechanical thrombectomy performed for treatment of a dominant right I5/OYD occlusion  complicated by vessel perforation treated with detachable coils. Given need for vessel sacrifice, final MCA status is TICI 2A, similar to the beginning of the procedure. PLAN: Refer to EMR. Electronically Signed   By: Pedro Earls M.D.   On: 02/20/2022 13:02   IR Angiogram Follow Up Study  Result Date: 02/20/2022 INDICATION: 77 year old female presenting with sudden onset left-sided weakness, aphasia and right gaze deviation; NIHSS 22 (later improved to 18). 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 Head CT showed a large acute territory infarct or hemorrhage. CT angiogram of the head and neck showed a right M2/MCA occlusion. He was taken to our service for mechanical thrombectomy. EXAM: ULTRASOUND-GUIDED VASCULAR ACCESS DIAGNOSTIC CEREBRAL ANGIOGRAM MECHANICAL THROMBECTOMY TRANSCATHETER EMBOLIZATION FLAT PANEL HEAD CT COMPARISON:  CT/CT angiogram of the head and neck February 18, 2022. MEDICATIONS: No antibiotics utilized. ANESTHESIA/SEDATION: The procedure was performed under general anesthesia. CONTRAST:  110 mL of Omnipaque 300 milligram/mL. FLUOROSCOPY: Radiation Exposure Index (as provided by the fluoroscopic device): 7,412 mGy Kerma COMPLICATIONS: SIR LEVEL B - Normal therapy, includes overnight admission for observation. TECHNIQUE: Informed written consent was obtained from the patient's niece after a thorough discussion of the procedural risks, benefits and alternatives. All questions were addressed. Maximal Sterile Barrier Technique was utilized including caps, mask, sterile gowns, sterile gloves, sterile drape, hand hygiene and skin antiseptic. A timeout was performed prior to the initiation of the procedure. Using the  modified Seldinger technique and a micropuncture kit, access was gained to the right radial artery at the wrist and a 6 French sheath was placed. Real-time ultrasound guidance was utilized for vascular access including the acquisition of a permanent ultrasound image documenting patency of the accessed vessel. Slow intra arterial infusion of 200 mcg nitroglycerin diluted in patient's own blood was performed. No significant fluctuation in patient's blood pressure seen. Then, a right radial artery angiogram was obtained via sheath side port. Next, a 6 [place] benchmark catheter was navigated over a 0.035" Terumo Glidewire into the right subclavian artery under fluoroscopic guidance. Under fluoroscopy, the catheter was then  advanced into the distal cervical segment of the right ICA. Frontal and lateral angiograms of the head were obtained. FINDINGS: 1. Normal brachial artery branching pattern seen. No significant anatomical variation. The right radial artery caliber is adequate for vascular access. Atherosclerotic changes are noted along the right ulnar artery without hemodynamically significant stenosis. 2. 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 3. Occlusion of a dominant right M2/MCA anterior division branch. Luminal irregularity of the M1 and proximal M2 segments of the right MCA, consistent with atherosclerotic disease. Severe stenosis at the proximal right A1/ACA. 4. Opacification of the right PCA vascular tree via a prominent posterior communicating artery. Luminal irregularity noted along the left PCA, consistent with atherosclerotic disease with moderate stenosis at the distal P2 segment. PROCEDURE: Using biplane roadmap guidance, a 5 [place] aspiration catheter was navigated over a phenom 21 microcatheter and an Aristotle 14 microguidewire into the cavernous  segment of the right ICA. The microcatheter was then navigated over the wire into the right M2/MCA anterior division branch. Then, a 3 x 20 mm solitaire stent retriever was deployed spanning the M2 segment. The device was allowed to intercalated with the clot for 4 minutes. The aspiration catheter was advanced to the M1 segment and connected to an aspiration pump. The thrombectomy device and aspiration catheter were removed under constant aspiration. The guide catheter was aspirated for debris. Right internal carotid artery angiograms with lateral views the showed partial recanalization of the right MCA (T2b) with 2 M3 branches to the perirolandic area occluded. Using biplane roadmap guidance, a 5 [place] aspiration catheter was navigated over a phenom 21 microcatheter and an Aristotle 14 microguidewire into the cavernous segment of the right ICA. Upon navigating microcatheter over the wire into the right M2/MCA unexpected deflection of the catheter was noted. Frontal and lateral angiograms with microcatheter contrast injection were performed showing contrast extravasation. Transcatheter embolization of injured vessel was performed with 3 detachable coils (2 x 3 mm target XL soft (x2) and 2 x 3 cm target Nano). Follow-up right internal carotid artery angiograms with frontal and lateral views of the head showed in significant decrease of contrast extravasation with mild persistent extravasation. The microcatheter was again navigated into the affected branch. Microcatheter contrast injection showed persistent contrast extravasation. Additional coil was placed more proximally (1.5 x 3 mm target 360 nano coil) resulting in branch occlusion. Right internal carotid artery angiograms with frontal and lateral views of the head showed occlusion of the right ICA terminus with new clot formation. Using biplane roadmap guidance, a 5 [place] aspiration catheter was navigated over an Aristotle 24 microguidewire into the  cavernous segment of the right ICA. The aspiration catheter was then advanced to the level of occlusion and connected to an aspiration pump. Continuous aspiration was performed for 2 minutes. The guide catheter was connected to a VacLok syringe. The aspiration catheter was subsequently removed under constant aspiration. The guide catheter was aspirated for debris. Right internal carotid artery angiograms with frontal and lateral views of the head showed complete recanalization of the right ICA and M1 segment. The posterior division remains patent. However, the anterior division is occluded. Given hemorrhagic complication from minimal catheter manipulation, decision was made to abort the procedure. Flat panel CT of the head was obtained and post processed in a separate workstation with concurrent attending physician supervision. Selected images were sent to  PACS. Expected right sylvian fissure contrast extravasation. The catheter was subsequently withdrawn. An inflatable band was placed and inflated over the right wrist access site. The vascular sheath was withdrawn and the band was slowly deflated until brisk flow was noted through the arteriotomy site. At this point, the band was reinflated with additional 4 cc of air to obtain patent hemostasis. IMPRESSION: Mechanical thrombectomy performed for treatment of a dominant right Z6/XWR occlusion complicated by vessel perforation treated with detachable coils. Given need for vessel sacrifice, final MCA status is TICI 2A, similar to the beginning of the procedure. PLAN: Refer to EMR. Electronically Signed   By: Pedro Earls M.D.   On: 02/20/2022 13:02   IR Angiogram Follow Up Study  Result Date: 02/20/2022 INDICATION: 77 year old female presenting with sudden onset left-sided weakness, aphasia and right gaze deviation; NIHSS 22 (later improved to 18). 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 Head CT showed a large acute territory infarct or hemorrhage. CT angiogram of the head and neck showed a right M2/MCA occlusion. He was taken to our service for mechanical thrombectomy. EXAM: ULTRASOUND-GUIDED VASCULAR ACCESS DIAGNOSTIC CEREBRAL ANGIOGRAM MECHANICAL THROMBECTOMY TRANSCATHETER EMBOLIZATION FLAT PANEL HEAD CT COMPARISON:  CT/CT angiogram of the head and neck February 18, 2022. MEDICATIONS: No antibiotics utilized. ANESTHESIA/SEDATION: The procedure was performed under general anesthesia. CONTRAST:  110 mL of Omnipaque 300 milligram/mL. FLUOROSCOPY: Radiation Exposure Index (as provided by the fluoroscopic device): 6,045 mGy Kerma COMPLICATIONS: SIR LEVEL B - Normal therapy, includes overnight admission for observation. TECHNIQUE: Informed written consent was obtained from the patient's niece after a thorough discussion of the procedural risks, benefits and alternatives. All questions were addressed. Maximal Sterile Barrier Technique was utilized including caps, mask, sterile gowns, sterile gloves, sterile drape, hand hygiene and skin antiseptic. A timeout was performed prior to the initiation of the procedure. Using the modified Seldinger technique and a micropuncture kit, access was gained to the right radial artery at the wrist and a 6 French sheath was placed. Real-time ultrasound guidance was utilized for vascular access including the acquisition of a permanent ultrasound image documenting patency of the accessed vessel. Slow intra arterial infusion of 200 mcg nitroglycerin diluted in patient's own blood was performed. No significant fluctuation in patient's blood pressure seen. Then, a right radial artery angiogram was obtained via sheath side port. Next, a 6 [place] benchmark catheter  was navigated over a 0.035" Terumo Glidewire into the right subclavian artery under fluoroscopic guidance. Under fluoroscopy, the catheter was then advanced into the distal cervical segment of the right ICA. Frontal and lateral angiograms of the head were obtained. FINDINGS: 1. Normal brachial artery branching pattern seen. No significant anatomical variation. The right radial artery caliber is adequate for vascular access. Atherosclerotic changes are noted along the right ulnar artery without hemodynamically significant stenosis. 2. 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 3. Occlusion of a dominant right M2/MCA anterior division branch. Luminal irregularity of the M1 and proximal M2 segments of the right MCA, consistent with atherosclerotic disease. Severe stenosis at the proximal right A1/ACA. 4. Opacification of the right PCA vascular tree via a prominent posterior communicating artery. Luminal irregularity noted along the  left PCA, consistent with atherosclerotic disease with moderate stenosis at the distal P2 segment. PROCEDURE: Using biplane roadmap guidance, a 5 [place] aspiration catheter was navigated over a phenom 21 microcatheter and an Aristotle 14 microguidewire into the cavernous segment of the right ICA. The microcatheter was then navigated over the wire into the right M2/MCA anterior division branch. Then, a 3 x 20 mm solitaire stent retriever was deployed spanning the M2 segment. The device was allowed to intercalated with the clot for 4 minutes. The aspiration catheter was advanced to the M1 segment and connected to an aspiration pump. The thrombectomy device and aspiration catheter were removed under constant aspiration. The guide catheter was aspirated for debris. Right internal carotid artery angiograms with lateral views the showed partial  recanalization of the right MCA (T2b) with 2 M3 branches to the perirolandic area occluded. Using biplane roadmap guidance, a 5 [place] aspiration catheter was navigated over a phenom 21 microcatheter and an Aristotle 14 microguidewire into the cavernous segment of the right ICA. Upon navigating microcatheter over the wire into the right M2/MCA unexpected deflection of the catheter was noted. Frontal and lateral angiograms with microcatheter contrast injection were performed showing contrast extravasation. Transcatheter embolization of injured vessel was performed with 3 detachable coils (2 x 3 mm target XL soft (x2) and 2 x 3 cm target Nano). Follow-up right internal carotid artery angiograms with frontal and lateral views of the head showed in significant decrease of contrast extravasation with mild persistent extravasation. The microcatheter was again navigated into the affected branch. Microcatheter contrast injection showed persistent contrast extravasation. Additional coil was placed more proximally (1.5 x 3 mm target 360 nano coil) resulting in branch occlusion. Right internal carotid artery angiograms with frontal and lateral views of the head showed occlusion of the right ICA terminus with new clot formation. Using biplane roadmap guidance, a 5 [place] aspiration catheter was navigated over an Aristotle 24 microguidewire into the cavernous segment of the right ICA. The aspiration catheter was then advanced to the level of occlusion and connected to an aspiration pump. Continuous aspiration was performed for 2 minutes. The guide catheter was connected to a VacLok syringe. The aspiration catheter was subsequently removed under constant aspiration. The guide catheter was aspirated for debris. Right internal carotid artery angiograms with frontal and lateral views of the head showed complete recanalization of the right ICA and M1 segment. The posterior division remains patent. However, the anterior  division is occluded. Given hemorrhagic complication from minimal catheter manipulation, decision was made to abort the procedure. Flat panel CT of the head was obtained and post processed in a separate workstation with concurrent attending physician supervision. Selected images were sent to PACS. Expected right sylvian fissure contrast extravasation. The catheter was subsequently withdrawn. An inflatable band was placed and inflated over the right wrist access site. The vascular sheath was withdrawn and the band was slowly deflated until brisk flow was noted through the arteriotomy site. At this point, the band was reinflated with additional 4 cc of air to obtain patent hemostasis. IMPRESSION: Mechanical thrombectomy performed for treatment of a dominant right H2/DJM occlusion complicated by vessel perforation treated with detachable coils. Given need for vessel sacrifice, final MCA status is TICI 2A, similar to the beginning of the procedure. PLAN: Refer to EMR. Electronically Signed   By: Pedro Earls M.D.   On: 02/20/2022 13:02   DG Abd Portable 1V  Result Date: 02/19/2022 CLINICAL DATA:  Enteric tube placement  EXAM: PORTABLE ABDOMEN - 1 VIEW COMPARISON:  Abdominal radiograph dated 02/19/2022 at 12:20 a.m. FINDINGS: Enteric tube has been advanced with tip and side hole projecting over the gastric body. Partially imaged pacemaker leads project over the right atrium and ventricle. Partially imaged aorto bi-iliac stent graft. Diffusely dilated gas-filled bowel loops are again seen. IMPRESSION: Enteric tube has been advanced with tip and side hole projecting over the gastric body. Electronically Signed   By: Darrin Nipper M.D.   On: 02/19/2022 16:21   MR BRAIN WO CONTRAST  Result Date: 02/19/2022 CLINICAL DATA:  Right MCA infarct.  Status post thrombectomy. EXAM: MRI HEAD WITHOUT CONTRAST TECHNIQUE: Multiplanar, multiecho pulse sequences of the brain and surrounding structures were obtained  without intravenous contrast. COMPARISON:  CT/CTA/CTP head 1 day prior. FINDINGS: Brain: There is extensive diffusion restriction with associated FLAIR signal abnormality throughout much of the right MCA distribution involving the lentiform nucleus, insula, corona radiata, and frontal, parietal, and temporal lobe cortex. There are scattered additional smaller infarcts in the bilateral cerebellar hemispheres and bilateral supratentorial cortex which are likely embolic in etiology. There is susceptibility artifact overlying the right cerebral hemisphere primarily in the sylvian fissure with associated sulcal FLAIR hyperintensity likely reflecting subarachnoid hemorrhage (Heidelberg classification 3c: Subarachnoid hemorrhage). Edema from the right MCA infarct results in sulcal effacement and partial effacement of the right lateral ventricle but no midline shift. There are additional remote infarcts in the bilateral basal ganglia/corona radiata and background advanced chronic small-vessel ischemic change. There are numerous foci of SWI signal dropout throughout both cerebral hemispheres primarily peripherally distributed. There is no solid mass lesion. Vascular: The proximal flow voids of the major intracranial vasculature are normal. Skull and upper cervical spine: Choose Sinuses/Orbits: There is mild mucosal thickening in the paranasal sinuses. The globes and orbits are unremarkable. Other: There is a probable sebaceous cyst in the right cheek. IMPRESSION: 1. Large acute infarct in the right MCA distribution with edema and mild regional mass effect but no midline shift. 2. Numerous additional smaller infarcts in the bilateral cerebral and cerebellar hemispheres are likely embolic in nature. 3. Subarachnoid hemorrhage primarily in the right sylvian fissure. 4. Numerous peripherally distributed punctate chronic microhemorrhages suspicious for cerebral amyloid angiopathy. Electronically Signed   By: Valetta Mole M.D.    On: 02/19/2022 15:50   ECHOCARDIOGRAM COMPLETE  Result Date: 02/19/2022    ECHOCARDIOGRAM REPORT   Patient Name:   Shannon Chen Date of Exam: 02/19/2022 Medical Rec #:  [id number]       Height:       65.5 in Accession #:    [id number]      Weight:       133.4 lb Date of Birth:  09/29/1945       BSA:          1.675 m Patient Age:    77 years        BP:           115/31 mmHg Patient Gender: F               HR:           83 bpm. Exam Location:  Inpatient Procedure: 2D Echo, Cardiac Doppler and Color Doppler Indications:    Stroke I63.9  History:        Patient has prior history of Echocardiogram examinations, most                 recent 12/30/2020. Cardiomyopathy, Previous Myocardial  Infarction and CAD, PAD and COPD, Arrythmias:RBBB and Atrial                 Fibrillation, Signs/Symptoms:Hypotension and Syncope; Risk                 Factors:Hypertension, Diabetes, Current Smoker and Dyslipidemia.  Sonographer:    Ronny Flurry Referring Phys: [id number] [place]  1. Left ventricular ejection fraction, by estimation, is 70 to 75%. The left ventricle has hyperdynamic function. The left ventricle has no regional wall motion abnormalities. There is moderate concentric left ventricular hypertrophy. Left ventricular diastolic parameters are consistent with Grade II diastolic dysfunction (pseudonormalization).  2. Right ventricular systolic function is normal. The right ventricular size is normal. There is normal pulmonary artery systolic pressure.  3. The mitral valve is degenerative. Mild to moderate mitral valve regurgitation. Moderate mitral annular calcification.  4. The aortic valve is abnormal. Aortic valve regurgitation is mild to moderate. Mild aortic valve stenosis.  5. The inferior vena cava is normal in size with greater than 50% respiratory variability, suggesting right atrial pressure of 3 mmHg. Comparison(s): Prior images reviewed side by side. Aortic valve is not well  visualized in patients past studies or recent CT. Aortic regurgitation not seen in prior study. FINDINGS  Left Ventricle: Left ventricular ejection fraction, by estimation, is 70 to 75%. The left ventricle has hyperdynamic function. The left ventricle has no regional wall motion abnormalities. The left ventricular internal cavity size was normal in size. There is moderate concentric left ventricular hypertrophy. Left ventricular diastolic parameters are consistent with Grade II diastolic dysfunction (pseudonormalization). Right Ventricle: The right ventricular size is normal. No increase in right ventricular wall thickness. Right ventricular systolic function is normal. There is normal pulmonary artery systolic pressure. The tricuspid regurgitant velocity is 2.58 m/s, and  with an assumed right atrial pressure of 3 mmHg, the estimated right ventricular systolic pressure is 74.9 mmHg. Left Atrium: Left atrial size was normal in size. Right Atrium: Right atrial size was normal in size. Pericardium: There is no evidence of pericardial effusion. Mitral Valve: The mitral valve is degenerative in appearance. Moderate mitral annular calcification. Mild to moderate mitral valve regurgitation. Tricuspid Valve: The tricuspid valve is normal in structure. Tricuspid valve regurgitation is trivial. No evidence of tricuspid stenosis. Aortic Valve: The aortic valve is abnormal. Aortic valve regurgitation is mild to moderate. Mild aortic stenosis is present. Aortic valve mean gradient measures 17.0 mmHg. Aortic valve peak gradient measures 29.9 mmHg. Aortic valve area, by VTI measures 1.93 cm. Pulmonic Valve: The pulmonic valve was not well visualized. Pulmonic valve regurgitation is mild to moderate. No evidence of pulmonic stenosis. Aorta: The aortic root and ascending aorta are structurally normal, with no evidence of dilitation. Venous: The inferior vena cava is normal in size with greater than 50% respiratory variability,  suggesting right atrial pressure of 3 mmHg. IAS/Shunts: The interatrial septum appears to be lipomatous. No atrial level shunt detected by color flow Doppler. Additional Comments: A device lead is visualized.  LEFT VENTRICLE PLAX 2D LVIDd:         4.40 cm   Diastology LVIDs:         2.40 cm   LV e' medial:    5.22 cm/s LV PW:         1.10 cm   LV E/e' medial:  28.5 LV IVS:        1.30 cm   LV e' lateral:   7.51 cm/s LVOT diam:  2.00 cm   LV E/e' lateral: 19.8 LV SV:         101 LV SV Index:   60 LVOT Area:     3.14 cm                           3D Volume EF:                          3D EF:        62 %                          LV EDV:       138 ml                          LV ESV:       52 ml                          LV SV:        86 ml RIGHT VENTRICLE             IVC RV S prime:     13.10 cm/s  IVC diam: 1.25 cm TAPSE (M-mode): 2.2 cm LEFT ATRIUM             Index        RIGHT ATRIUM          Index LA diam:        3.70 cm 2.21 cm/m   RA Area:     9.49 cm LA Vol (A2C):   34.7 ml 20.72 ml/m  RA Volume:   17.00 ml 10.15 ml/m LA Vol (A4C):   62.1 ml 37.08 ml/m LA Biplane Vol: 49.1 ml 29.32 ml/m  AORTIC VALVE AV Area (Vmax):    1.92 cm AV Area (Vmean):   1.89 cm AV Area (VTI):     1.93 cm AV Vmax:           273.33 cm/s AV Vmean:          192.000 cm/s AV VTI:            0.524 m AV Peak Grad:      29.9 mmHg AV Mean Grad:      17.0 mmHg LVOT Vmax:         167.00 cm/s LVOT Vmean:        115.667 cm/s LVOT VTI:          0.322 m LVOT/AV VTI ratio: 0.61  AORTA Ao Root diam: 2.90 cm Ao Asc diam:  2.70 cm MITRAL VALVE                TRICUSPID VALVE MV Area (PHT): 4.89 cm     TR Peak grad:   26.6 mmHg MV Decel Time: 155 msec     TR Vmax:        258.00 cm/s MR Peak grad: 101.2 mmHg MR Mean grad: 84.0 mmHg     SHUNTS MR Vmax:      503.00 cm/s   Systemic VTI:  0.32 m MR Vmean:     436.0 cm/s    Systemic Diam: 2.00 cm MV E velocity: 149.00 cm/s MV A velocity: 118.00 cm/s MV E/A ratio:  1.26 Rudean Haskell MD  Electronically signed by Rudean Haskell MD Signature Date/Time: 02/19/2022/11:36:45 AM  Final    DG Abd Portable 1V  Result Date: 02/19/2022 CLINICAL DATA:  Check gastric catheter placement EXAM: PORTABLE ABDOMEN - 1 VIEW COMPARISON:  None Available. FINDINGS: Scattered large and small bowel gas is noted. Aortic stent graft is noted. Renal excretion of contrast is noted related to prior vascular procedure. Gastric catheter is noted with the tip in the stomach. Proximal side port however lies at the gastroesophageal junction. This should be advanced deeper into the stomach. IMPRESSION: Gastric catheter is described which should be advanced deeper into the stomach. Electronically Signed   By: Inez Catalina M.D.   On: 02/19/2022 00:36   CT CEREBRAL PERFUSION W CONTRAST  Result Date: 02/18/2022 CLINICAL DATA:  Left facial droop EXAM: CT PERFUSION BRAIN TECHNIQUE: Multiphase CT imaging of the brain was performed following IV bolus contrast injection. Subsequent parametric perfusion maps were calculated using RAPID software. RADIATION DOSE REDUCTION: This exam was performed according to the departmental dose-optimization program which includes automated exposure control, adjustment of the mA and/or kV according to patient size and/or use of iterative reconstruction technique. CONTRAST:  19m OMNIPAQUE IOHEXOL 350 MG/ML SOLN COMPARISON:  None Available. FINDINGS: CT Brain Perfusion Findings: CBF (<30%) Volume: 368mPerfusion (Tmax>6.0s) volume: 3762mismatch Volume: 5mL59mPECTS on noncontrast CT Head: 10 at 7:47 p.m. today. Infarct Core: 32 mL Infarction Location:Anterior right MCA territory IMPRESSION: A 32 mL core infarct with 5 mL penumbra. Electronically Signed   By: KeviUlyses Jarred.   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 EXAM: CT ANGIOGRAPHY CHEST, ABDOMEN AND PELVIS  TECHNIQUE: Non-contrast CT of the chest was initially obtained. Multidetector CT imaging through the chest, abdomen and pelvis was performed using the standard protocol during bolus administration of intravenous contrast. Multiplanar reconstructed images and MIPs were obtained and reviewed to evaluate the vascular anatomy. RADIATION DOSE REDUCTION: This exam was performed according to the departmental dose-optimization program which includes automated exposure control, adjustment of the mA and/or kV according to patient size and/or use of iterative reconstruction technique. CONTRAST:  75mL46mIPAQUE IOHEXOL 350 MG/ML SOLN COMPARISON:  December 15, 2021 FINDINGS: CTA CHEST FINDINGS Cardiovascular: A dual lead AICD is noted. There is marked severity calcification of the thoracic aorta. The distal aspect of the descending thoracic aorta is tortuous in appearance and measures approximately 3.1 cm x 2.8 cm. Satisfactory opacification of the pulmonary arteries to the segmental level. No evidence of pulmonary embolism. Normal heart size with marked severity coronary artery calcification. No pericardial effusion. Mediastinum/Nodes: No enlarged mediastinal, hilar, or axillary lymph nodes. Thyroid gland, trachea, and esophagus demonstrate no significant findings. Lungs/Pleura: Mild, bilateral atelectatic changes are seen without evidence of acute infiltrate, pleural effusion or pneumothorax. Musculoskeletal: No chest wall abnormality. No acute or significant osseous findings. Review of the MIP images confirms the above findings. CTA ABDOMEN AND PELVIS FINDINGS VASCULAR Aorta: Patent aortoiliac stent graft, without evidence of stent occlusion or stent leak. Celiac: Marked severity calcification without evidence of aneurysm, dissection, vasculitis or significant stenosis. SMA: Marked severity calcification without evidence of aneurysm, dissection, vasculitis or significant stenosis. Renals: Both renal arteries are patent without  evidence of aneurysm, dissection, vasculitis, fibromuscular dysplasia or significant stenosis. IMA: Patent without evidence of aneurysm, dissection, vasculitis or significant stenosis. 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 Veins: No obvious venous abnormality within the limitations of this arterial phase study. Review of the MIP images confirms the above findings. NON-VASCULAR Hepatobiliary: No focal liver abnormality is seen. No gallstones or gallbladder wall thickening. The common bile duct measures 11.6 mm in diameter. Pancreas: There is mild pancreatic ductal dilatation without evidence of surrounding inflammatory changes. Spleen: A 3.1 cm x 2.1 cm cystic appearing area is seen within the anterior aspect of the spleen. Adrenals/Urinary Tract: There is diffuse bilateral adrenal gland enlargement, left greater than right. Kidneys are normal in size, without renal calculi or hydronephrosis. An 8 mm diameter simple cyst is seen within the anterolateral aspect of the mid left kidney. Bladder is unremarkable. A 4.8 cm x 4.0 cm cystic appearing structure is seen adjacent to the anterolateral aspect of the urinary bladder on the left. This represents a new finding when compared to the prior study. Stomach/Bowel: Stomach is within normal limits. Appendix appears normal. No evidence of bowel wall thickening, distention, or inflammatory changes. Noninflamed diverticula are seen throughout the descending and sigmoid colon. Lymphatic: No abnormal abdominal or pelvic lymph nodes are identified. Reproductive: Status post hysterectomy. Other: A 2.1 cm x 1.4 cm fat containing ventral hernia is noted along the mid abdomen. No abdominopelvic ascites. Musculoskeletal: Multilevel degenerative changes seen throughout the lumbar spine. Review of the MIP images confirms the above findings.  IMPRESSION: 1. No evidence of pulmonary embolism or other acute intrathoracic process. 2. Patent aortoiliac stent graft without evidence of stent occlusion or stent leak. 3. 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 4. Colonic diverticulosis. 5. Cystic appearing area adjacent to the anterolateral aspect of the urinary bladder on the left, which represents a new finding when compared to the prior study. This may represent a large bladder diverticulum. 6. Stable splenic cyst. 7. Small fat containing ventral hernia along the mid abdomen. 8. Aortic atherosclerosis. Aortic Atherosclerosis (ICD10-I70.0). Electronically Signed   By: Virgina Norfolk M.D.   On: 02/18/2022 20:25   CT HEAD CODE STROKE WO CONTRAST  Result Date: 02/18/2022 CLINICAL DATA:  Left facial droop and right gaze deviation EXAM: CT HEAD WITHOUT CONTRAST CT ANGIOGRAPHY OF THE HEAD AND NECK TECHNIQUE: Contiguous axial images were obtained from the base of the skull through the vertex without intravenous contrast. Multidetector CT imaging of the head and neck was performed using the standard protocol during bolus administration of intravenous contrast. Multiplanar CT image reconstructions and MIPs were obtained to evaluate the vascular anatomy. Carotid stenosis measurements (when applicable) are obtained utilizing NASCET criteria, using the distal internal carotid diameter as the denominator. RADIATION DOSE REDUCTION: This exam was performed according to the departmental dose-optimization program which includes automated exposure control, adjustment of the mA and/or kV according to patient size and/or use of iterative reconstruction technique. CONTRAST:  51m OMNIPAQUE IOHEXOL 350 MG/ML SOLN COMPARISON:  12/09/2021 head CT FINDINGS: CT HEAD Brain: No acute intracranial hemorrhage. There are multiple old infarcts of the  deep gray nuclei. There is periventricular hypoattenuation compatible with chronic microvascular disease. Generalized volume loss. Vascular: Atherosclerosis of the internal carotid arteries at the skull base. Skull: Normal. Sinuses/Orbits: Bilateral posterior ethmoid air cell mucosal thickening. Orbits are normal. ASPECTS ([place] Early CT Score) - Ganglionic level infarction (caudate, lentiform nuclei, internal capsule, insula, M1-M3 cortex): 7 - Supraganglionic infarction (M4-M6 cortex): 3 Total score (0-10 with 10 being normal):  10 Review of the MIP images confirms the above findings CTA NECK Aortic arch: Aortic atherosclerosis.  Normal branching pattern. Right carotid system: Atherosclerosis throughout the course of the right common carotid artery. The bifurcation is widely patent. No hemodynamically significant stenosis of the right ICA. Left carotid system: Common carotid atherosclerosis without stenosis. Atherosclerotic calcification at the bifurcation extending into the ICA resulting in 60% stenosis. The distal ICA is widely patent. Vertebral arteries:Left dominant. Multifocal atherosclerosis along the course of both vertebral arteries resulting in mild stenosis of the left mid V2 segment head multifocal severe stenosis of the right P2 segment. Both vertebral arteries remain patent to the skull base. Skeleton: Negative Other neck: Unremarkable. Upper chest: Clear CTA HEAD Anterior circulation: There is calcific atherosclerosis of both internal carotid arteries at the skull base with mild stenosis bilaterally. The anterior cerebral arteries are normal. The right MCA is patent proximally in the M1 segment is normal. There is an early right bifurcation. There is occlusion of a M2 superior division branch in the anterior sylvian fissure (series 8, image 109). The left MCA is normal. Posterior circulation: Both P comms are patent. There is atherosclerotic irregularity of the basilar artery with  moderate stenosis of the midportion. The diminutive right vertebral artery terminates in PICA. Both PCAs are patent. There is mild atherosclerotic irregularity bilaterally. Venous sinuses: Normal allowing for venous dominant contrast phase. Anatomic variants: Bilateral fetal origins of the posterior cerebral arteries. Delayed phase: None IMPRESSION: 1. Occlusion of a right M2 superior division branch in the anterior Sylvian fissure. No other intracranial occlusion. 2. No acute intracranial hemorrhage. ASPECTS is 10. 3. 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 4. 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 5. 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 Aortic Atherosclerosis (ICD10-I70.0). These results were called by telephone at the time of interpretation on 02/18/2022 at 8:10 pm to provider [facility] , who verbally acknowledged these results. Electronically Signed   By: Ulyses Jarred M.D.   On: 02/18/2022 20:23   CT ANGIO HEAD NECK W WO CM (CODE STROKE)  Result Date: 02/18/2022 CLINICAL DATA:  Left facial droop and right gaze deviation EXAM: CT HEAD WITHOUT CONTRAST CT ANGIOGRAPHY OF THE HEAD AND NECK TECHNIQUE: Contiguous axial images were obtained from the base of the skull through the vertex without intravenous contrast. Multidetector CT imaging of the head and neck was performed using the standard protocol during bolus administration of intravenous contrast. Multiplanar CT image reconstructions and MIPs were obtained to evaluate the vascular anatomy. Carotid stenosis measurements (when applicable) are obtained utilizing NASCET criteria, using the distal internal carotid diameter as the denominator. RADIATION DOSE REDUCTION: This exam was performed according to the departmental dose-optimization program which includes automated exposure control, adjustment of the mA and/or kV according to patient size and/or use of iterative reconstruction  technique. CONTRAST:  56m OMNIPAQUE IOHEXOL 350 MG/ML SOLN COMPARISON:  12/09/2021 head CT FINDINGS: CT HEAD Brain: No acute intracranial hemorrhage. 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 There is periventricular hypoattenuation compatible with chronic microvascular disease. Generalized volume loss. Vascular: Atherosclerosis of the internal carotid arteries at the skull base. Skull: Normal. Sinuses/Orbits: Bilateral posterior ethmoid air cell mucosal thickening. Orbits are normal. ASPECTS ([place] Early CT Score) - Ganglionic level infarction (caudate, lentiform nuclei, internal capsule, insula, M1-M3 cortex): 7 - Supraganglionic infarction (M4-M6 cortex): 3 Total score (0-10 with 10 being normal): 10 Review of the MIP  images confirms the above findings CTA NECK Aortic arch: Aortic atherosclerosis.  Normal branching pattern. Right carotid system: Atherosclerosis throughout the course of the right common carotid artery. The bifurcation is widely patent. No hemodynamically significant stenosis of the right ICA. Left carotid system: Common carotid atherosclerosis without stenosis. Atherosclerotic calcification at the bifurcation extending into the ICA resulting in 60% stenosis. The distal ICA is widely patent. Vertebral arteries:Left dominant. Multifocal atherosclerosis along the course of both vertebral arteries resulting in mild stenosis of the left mid V2 segment head multifocal severe stenosis of the right P2 segment. Both vertebral arteries remain patent to the skull base. Skeleton: Negative Other neck: Unremarkable. Upper chest: Clear CTA HEAD Anterior circulation: There is calcific atherosclerosis of both internal carotid arteries at the skull base with mild stenosis bilaterally. The anterior cerebral arteries are normal. The right MCA is patent proximally in the M1 segment is normal. There is an early right bifurcation. There is occlusion of a M2 superior division branch in  the anterior sylvian fissure (series 8, image 109). The left MCA is normal. Posterior circulation: Both P comms are patent. There is atherosclerotic irregularity of the basilar artery with moderate stenosis of the midportion. The diminutive right vertebral artery terminates in PICA. Both PCAs are patent. There is mild atherosclerotic irregularity bilaterally. Venous sinuses: Normal allowing for venous dominant contrast phase. Anatomic variants: Bilateral fetal origins of the posterior cerebral arteries. Delayed phase: None IMPRESSION: 1. Occlusion of a right M2 superior division branch in the anterior Sylvian fissure. No other intracranial occlusion. 2. No acute intracranial hemorrhage. ASPECTS is 10. 3. 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 4. 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 5. 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 Aortic Atherosclerosis (ICD10-I70.0). These results were called by telephone at the time of interpretation on 02/18/2022 at 8:10 pm to provider [facility] , who verbally acknowledged these results. Electronically Signed   By: Ulyses Jarred M.D.   On: 02/18/2022 20:23    Labs:  CBC: Recent Labs    02/18/22 1940 02/18/22 2002 02/19/22 0420 02/19/22 0612 02/20/22 0326  WBC 9.7  --  8.3 8.4 9.6  HGB 11.3* 11.2* 8.7* 9.2* 9.9*  HCT 36.4 33.0* 26.9* 29.9* 30.8*  PLT 74*  --  86* 101* 122*    COAGS: Recent Labs    02/18/22 2355  INR 1.3*  APTT 32    BMP: Recent Labs    12/20/21 0604 02/18/22 2002 02/18/22 2353 02/18/22 2355 02/19/22 0612 02/20/22 0326  NA 142 140 140  --  144 143  K 3.9 5.5* 3.4*  --  3.6 3.7  CL 112* 114* 112*  --  113* 117*  CO2 25  --  16*  --  20* 17*  GLUCOSE 115* 158* 216*  --  130* 158*  BUN 32* 48* 27*  --  25* 31*  CALCIUM 7.8*  --  7.8*  --  8.0* 7.8*  CREATININE 0.84 1.30* 1.03* 0.98 0.97 0.92  GFRNONAA >60  --  56* 59* 60* >60    LIVER FUNCTION  TESTS: Recent Labs    12/11/21 0407 12/12/21 0528 12/13/21 0436 02/18/22 2353  BILITOT 0.8 0.6 0.8 1.0  AST 80* 60* 56* 19  ALT 101* 94* 95* 20  ALKPHOS 50 54 79 118  PROT 6.4* 6.3* 6.5 5.3*  ALBUMIN 3.1* 3.0* 2.9* 1.9*    Assessment and Plan: Right M2/MCA occlusion s/p thrombectomy complicated by  vessel rupture requiring coil embolization.  Recanalization of the right supraclinoid right ICA, unchanged appearance/flow in the MCA.  Patient remains intubated.  Note plans for one-way extubation with possible transition to comfort care.  Right radial wrist site intact.  No further plans with IR.   Electronically Signed: Docia Barrier, PA 02/20/2022, 4:06 PM   I spent a total of 15 Minutes at the the patient's bedside AND on the patient's hospital floor or unit, greater than 50% of which was counseling/coordinating care for R MCA occlusion.

## 2022-02-20 NOTE — Progress Notes (Signed)
SLP Cancellation Note  Patient Details Name: Shannon Chen MRN: [id number] DOB: Oct 11, 1945   Cancelled treatment:       Reason Eval/Treat Not Completed: Patient not medically ready (remains on vent this am). Will f/u as able.    Osie Bond., M.A. [place] Office [phone]  Secure chat preferred  02/20/2022, 7:41 AM

## 2022-02-20 NOTE — Progress Notes (Signed)
NAME:  Shannon Chen, MRN:  [id number], DOB:  1945/09/26, LOS: 2 ADMISSION DATE:  02/18/2022, CONSULTATION DATE: 02/19/2022 REFERRING MD: Dr. Lorrin Goodell, CHIEF COMPLAINT: Ventilator management  History of Present Illness:  Patient presented with sudden onset left-sided weakness, aphasia, right gaze deviation. Patient was found slumped over, not talking had been seen 15 minutes earlier Brought in for code stroke.  At baseline, requires some help, able to walk around with a walker  Had emergent thrombectomy Achieved TICI 2B and coil embolization of M3 MCA remain TICI 2A, related to branch embolization On vent postprocedure  Pertinent  Medical History   has a past medical history of Arthritis, AV block, Mobitz II, CAD (coronary artery disease), Carotid artery disease ([place]), Essential hypertension, History of stress test, Hyperlipidemia, Hypertrophic cardiomyopathy ([place]), and Presence of permanent cardiac pacemaker.   Significant Hospital Events: Including procedures, antibiotic start and stop dates in addition to other pertinent events   02/18/2022-thrombectomy   Interim History / Subjective:  Hypertensive overnight. UOP lower. More awake today.   Objective   Blood pressure (!) 170/56, pulse 87, temperature 99 F (37.2 C), temperature source Oral, resp. rate 16, weight 64.3 kg, SpO2 100 %.    Vent Mode: PRVC FiO2 (%):  [40 %] 40 % Set Rate:  [18 bmp] 18 bmp Vt Set:  [490 mL] 490 mL PEEP:  [5 cmH20] 5 cmH20 Plateau Pressure:  [10 cmH20-17 cmH20] 14 cmH20   Intake/Output Summary (Last 24 hours) at 02/20/2022 0807 Last data filed at 02/20/2022 0600 Gross per 24 hour  Intake 2898.01 ml  Output 527 ml  Net 2371.01 ml    Filed Weights   02/18/22 1900 02/19/22 0000 02/20/22 0500  Weight: 61.8 kg 60.5 kg 64.3 kg    Examination:  General: Elderly female on vent HENT: [place]/AT, PERRL, no JVD Lungs: Clear Cardiovascular: RRR, no MRG Abdomen: Soft, NT, ND Extremities: No acute  deformity. No collection  Neuro: Moving R side spontaneously. Maybe wiggles R toes to command? No upper extremity or facial commands followed. Withdrawals from pain L lower. Very wean movement of L upper to pain.   CT angio Head and Neck with contrast: Right MCA M2 occlusion  CT perfusion: Core 73ml, penumbra 57ml.   1/3 MRI brain:  Large acute infarct in the right MCA distribution with edema and mild regional mass effect but no midline shift. Numerous additional smaller infarcts in the bilateral cerebral and cerebellar hemispheres are likely embolic in nature. Subarachnoid hemorrhage primarily in the right sylvian fissure. Numerous peripherally distributed punctate chronic micro hemorrhages suspicious for cerebral amyloid angiopathy.  Echo LVEF 70-75%, moderate LVH. Normal RV. Mild/mod MR.    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 SAH S/p thrombectomy S/p coil embolization of M3 Achieved TICI 2A - Neuro checks - Neurology following - Keep SBP < 160 mmHg - Significant stroke deficits are anticipated.   Hypoxemic respiratory failure - Continue mechanical ventilation - Wean as tolerated (SBT currently tolerating well) - Ventilator associated pneumonia prevention protocol - Minimize sedation. Can add precedex if something is needed.   Hypertension - Continue Cleviprex (still requireing elevated doses) - Restart home toprol, amlodipine - keep SBP < 160 per [facility] protocol.   Type 2 diabetes - SSI  Chronic obstructive pulmonary disease without exacerbation - On Trelegy as outpatient - As needed albuterol - Yupelri, brovana, budesonide.   History of coronary artery disease History of hypertrophic  cardiomyopathy - Monitor  AKI - Maintain renal perfusion - Trend electrolytes  Best Practice (right click and "Reselect all SmartList Selections" daily)   Diet/type: NPO TF DVT prophylaxis: LMWH GI  prophylaxis: PPI Lines: N/A Foley:  N/A Code Status:  full code Last date of multidisciplinary goals of care discussion [pending]   Critical care time 42 minutes  Georgann Housekeeper, AGACNP-BC [place] for personal pager PCCM on call pager [phone] until 7pm. Please call Elink 7p-7a. [phone]  02/20/2022 8:07 AM

## 2022-02-20 NOTE — Progress Notes (Addendum)
STROKE TEAM PROGRESS NOTE   INTERVAL HISTORY Patient is seen in her room with no family at the bedside.  She remains intubated off sedation.  Hope to extubate today.  She continues to require Cleviprex to maintain control of blood pressure, will start as needed medications and liberalize blood pressure goal to systolic less than 195.  Her neurological exam is stable.  Will reevaluate CT head in the morning. Vitals:   02/20/22 0914 02/20/22 0915 02/20/22 0930 02/20/22 1152  BP:      Pulse:  91 92   Resp:  19 17   Temp:    99.8 F (37.7 C)  TempSrc:    Axillary  SpO2: 100% 100% 100%   Weight:       CBC:  Recent Labs  Lab 02/18/22 1940 02/18/22 2002 02/19/22 0612 02/20/22 0326  WBC 9.7   < > 8.4 9.6  NEUTROABS 7.8*  --  6.9  --   HGB 11.3*   < > 9.2* 9.9*  HCT 36.4   < > 29.9* 30.8*  MCV 90.8   < > 88.2 89.3  PLT 74*   < > 101* 122*   < > = values in this interval not displayed.    Basic Metabolic Panel:  Recent Labs  Lab 02/19/22 0612 02/19/22 1627 02/20/22 0326  NA 144  --  143  K 3.6  --  3.7  CL 113*  --  117*  CO2 20*  --  17*  GLUCOSE 130*  --  158*  BUN 25*  --  31*  CREATININE 0.97  --  0.92  CALCIUM 8.0*  --  7.8*  MG 1.9 1.9 1.9  PHOS  --  3.2 3.3    Lipid Panel:  Recent Labs  Lab 02/19/22 0420 02/20/22 0326  CHOL 70  --   TRIG 134 140  HDL 12*  --   CHOLHDL 5.8  --   VLDL 27  --   LDLCALC 31  --     HgbA1c:  Recent Labs  Lab 02/19/22 0420  HGBA1C 7.1*   Urine Drug Screen: No results for input(s): "LABOPIA", "COCAINSCRNUR", "LABBENZ", "AMPHETMU", "THCU", "LABBARB" in the last 168 hours.  Alcohol Level  Recent Labs  Lab 02/18/22 2351  ETH <10     IMAGING past 24 hours DG Abd Portable 1V  Result Date: 02/19/2022 CLINICAL DATA:  Enteric tube placement EXAM: PORTABLE ABDOMEN - 1 VIEW COMPARISON:  Abdominal radiograph dated 02/19/2022 at 12:20 a.m. FINDINGS: Enteric tube has been advanced with tip and side hole projecting over the  gastric body. Partially imaged pacemaker leads project over the right atrium and ventricle. Partially imaged aorto bi-iliac stent graft. Diffusely dilated gas-filled bowel loops are again seen. IMPRESSION: Enteric tube has been advanced with tip and side hole projecting over the gastric body. Electronically Signed   By: Darrin Nipper M.D.   On: 02/19/2022 16:21   MR BRAIN WO CONTRAST  Result Date: 02/19/2022 CLINICAL DATA:  Right MCA infarct.  Status post thrombectomy. EXAM: MRI HEAD WITHOUT CONTRAST TECHNIQUE: Multiplanar, multiecho pulse sequences of the brain and surrounding structures were obtained without intravenous contrast. COMPARISON:  CT/CTA/CTP head 1 day prior. FINDINGS: Brain: There is extensive diffusion restriction with associated FLAIR signal abnormality throughout much of the right MCA distribution involving the lentiform nucleus, insula, corona radiata, and frontal, parietal, and temporal lobe cortex. There are scattered additional smaller infarcts in the bilateral cerebellar hemispheres and bilateral supratentorial cortex which are likely embolic in  etiology. There is susceptibility artifact overlying the right cerebral hemisphere primarily in the sylvian fissure with associated sulcal FLAIR hyperintensity likely reflecting subarachnoid hemorrhage (Heidelberg classification 3c: Subarachnoid hemorrhage). Edema from the right MCA infarct results in sulcal effacement and partial effacement of the right lateral ventricle but no midline shift. There are additional remote infarcts in the bilateral basal ganglia/corona radiata and background advanced chronic small-vessel ischemic change. There are numerous foci of SWI signal dropout throughout both cerebral hemispheres primarily peripherally distributed. There is no solid mass lesion. Vascular: The proximal flow voids of the major intracranial vasculature are normal. Skull and upper cervical spine: Choose Sinuses/Orbits: There is mild mucosal thickening  in the paranasal sinuses. The globes and orbits are unremarkable. Other: There is a probable sebaceous cyst in the right cheek. IMPRESSION: 1. Large acute infarct in the right MCA distribution with edema and mild regional mass effect but no midline shift. 2. Numerous additional smaller infarcts in the bilateral cerebral and cerebellar hemispheres are likely embolic in nature. 3. Subarachnoid hemorrhage primarily in the right sylvian fissure. 4. Numerous peripherally distributed punctate chronic microhemorrhages suspicious for cerebral amyloid angiopathy. Electronically Signed   By: Valetta Mole M.D.   On: 02/19/2022 15:50    PHYSICAL EXAM General: Intubated elderly African-American lady in no acute distress Respiratory: Respirations synchronous with ventilator Neurological Exam (no sedation): Aphasic.  Will not follow commands.  Eyes open and will regard examiner.  Pupils equal round and reactive to light, right gaze deviation and will not cross midline, does not blink to threat on the left.  Will move bilateral upper extremities spontaneously and on purposefully, will withdraw bilateral lower extremities to noxious stimuli.  ASSESSMENT/PLAN Shannon Chen is a 77 y.o. 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  TNK was not offered due to low platelet count, and mechanical thrombectomy was performed.  TICI2B flow was achieved in the right MCA, however procedure was complicated by vessel rupture requiring vessel sacrifice with coil embolization.  Patient remained intubated after procedure and was transferred to the ICU.  Stroke:  right MCA stroke due to occlusion of dominant right M2/MCA status post thrombectomy with partial  recanalization [id number] complicated by vessel rupture requiring vessel sacrifice with coil embolization Etiology: Severe intracranial atherosclerotic disease Code Stroke CT head No acute abnormality.  Multiple old infarcts, chronic microvascular disease ASPECTS 10.    CTA head & neck occlusion of right M2 superior division branch, 60% stenosis of left ICA, severe stenosis of right V2 segment, moderate stenosis of basilar artery MRI large acute right MCA territory infarct, as well as numerous smaller infarcts in bilateral cerebral and cerebellar hemispheres, subarachnoid hemorrhage and right sylvian fissure, numerous chronic microhemorrhages suspicious for CAA 2D Echo EF 70 to 27%, grade 2 diastolic dysfunction, normal left atrial size, no atrial level shunt LDL 31 HgbA1c 6.4 VTE prophylaxis -Lovenox    Diet   Diet NPO time specified   aspirin 81 mg daily and clopidogrel 75 mg daily prior to admission, now on No antithrombotic, will evaluate with CT head in the morning and consider starting aspirin at that time. Therapy recommendations: Pending Disposition: Pending  Hypertension Home meds: Norvasc 2.5 mg daily Stable Keep systolic blood pressure 062-376 for first 24 hours, then less than 180 Long-term BP goal  normotensive  Hyperlipidemia Home meds: Rosuvastatin 40 mg daily, resumed in hospital LDL 31, goal < 70 Continue statin at discharge  Diabetes type II Controlled Home meds: Farxiga 10 mg daily HgbA1c 6.4, goal < 7.0 CBGs Recent Labs    02/20/22 0338 02/20/22 0814 02/20/22 1150  GLUCAP 156* 145* 158*     SSI  Respiratory failure Patient left intubated after procedure Ventilator management per CCM Will attempt extubation today  Other Stroke Risk Factors Advanced Age >/= 71  Former cigarette smoker Hx stroke Coronary artery disease  Other Active Problems None  Hospital day # 2  Cortney E Ernestina Columbia , MSN, AGACNP-BC [facility] See Amion for  schedule and pager information 02/20/2022 12:30 PM  I have personally obtained history,examined this patient, reviewed notes, independently viewed imaging studies, participated in medical decision making and plan of care.ROS completed by me personally and pertinent positives fully documented  I have made any additions or clarifications directly to the above note. Agree with note above.  Patient remains on ventilatory support for respiratory failure but appears to be weaning.  Prognosis seems guarded.  Will discuss with family goals of care and would likely recommend one-way extubation as if she fails she may require tracheostomy and PEG tube and prolonged nursing home care which the patient and family may not want.  Discussed with Dr. Chestine Spore critical care medicine.  No family available at the bedside. This patient is critically ill and at significant risk of neurological worsening, death and care requires constant monitoring of vital signs, hemodynamics,respiratory and cardiac monitoring, extensive review of multiple databases, frequent neurological assessment, discussion with family, other specialists and medical decision making of high complexity.I have made any additions or clarifications directly to the above note.This critical care time does not reflect procedure time, or teaching time or supervisory time of PA/NP/Med Resident etc but could involve care discussion time.  I spent 30 minutes of neurocritical care time  in the care of  this patient.     Delia Heady, MD Medical Director [facility] Pager: [phone] 02/20/2022 2:34 PM   To contact Stroke Continuity provider, please refer to [email or web address]. After hours, contact General Neurology

## 2022-02-20 NOTE — Progress Notes (Signed)
OT Cancellation Note  Patient Details Name: Shannon Chen MRN: [id number] DOB: 1945-07-19   Cancelled Treatment:    Reason Eval/Treat Not Completed: Patient not medically ready(pt remains on vent, not following commands, and awaiting [place] meeting). Per RN will sign off and await re-consult when appropriate.   [place] Office [place] 02/20/2022, 7:59 AM

## 2022-02-20 NOTE — Progress Notes (Signed)
PT Cancellation Note  Patient Details Name: Shannon Chen MRN: [id number] DOB: 05-23-1945   Cancelled Treatment:    Reason Eval/Treat Not Completed: Patient not medically ready (pt remains on vent, not following commands and await [place] meeting. Per RN will sign off and await reconsult when appropriate)   Sadao Weyer B [name] 02/20/2022, 7:54 AM [place] Office: [phone]

## 2022-02-20 NOTE — IPAL (Addendum)
  Interdisciplinary Goals of Care Family Meeting   Date carried out: 02/20/2022  Location of the meeting: Bedside  Member's involved: Nurse Practitioner, Bedside Registered Nurse, and Family Member or next of kin  Durable Power of Attorney or acting medical decision maker: Shannon Chen. Nephew. Also spoke with other nieces and nephews Shannon Chen and Shannon Chen) who all elected Shannon Chen the Media planner.     Discussion: We discussed goals of care for Shannon Chen . Above all they do not want her to suffer and do not believe she would want to spend a prolonged course on the ventilator. We will give her another day or two on the vent to see if she progresses, but will plan for a once-way extubation.  Code status:   Code Status: DNR   Disposition: Continue current acute care  Time spent for the meeting: 68 minutes    Shannon Chen, AGACNP-BC [place] for personal pager PCCM on call pager [phone] until 7pm. Please call Elink 7p-7a. [phone]  02/20/2022 3:26 PM

## 2022-02-21 ENCOUNTER — Inpatient Hospital Stay (HOSPITAL_COMMUNITY): Payer: Medicare HMO

## 2022-02-21 ENCOUNTER — Encounter (HOSPITAL_COMMUNITY): Payer: Self-pay

## 2022-02-21 DIAGNOSIS — I63511 Cerebral infarction due to unspecified occlusion or stenosis of right middle cerebral artery: Secondary | ICD-10-CM | POA: Diagnosis not present

## 2022-02-21 DIAGNOSIS — J9600 Acute respiratory failure, unspecified whether with hypoxia or hypercapnia: Secondary | ICD-10-CM

## 2022-02-21 HISTORY — PX: IR CT HEAD LTD: IMG2386

## 2022-02-21 LAB — POCT I-STAT 7, (LYTES, BLD GAS, ICA,H+H)
Acid-base deficit: 9 mmol/L — ABNORMAL HIGH (ref 0.0–2.0)
Bicarbonate: 16 mmol/L — ABNORMAL LOW (ref 20.0–28.0)
Calcium, Ion: 1.23 mmol/L (ref 1.15–1.40)
HCT: 30 % — ABNORMAL LOW (ref 36.0–46.0)
Hemoglobin: 10.2 g/dL — ABNORMAL LOW (ref 12.0–15.0)
O2 Saturation: 97 %
Patient temperature: 98.7
Potassium: 3.5 mmol/L (ref 3.5–5.1)
Sodium: 143 mmol/L (ref 135–145)
TCO2: 17 mmol/L — ABNORMAL LOW (ref 22–32)
pCO2 arterial: 30.4 mmHg — ABNORMAL LOW (ref 32–48)
pH, Arterial: 7.33 — ABNORMAL LOW (ref 7.35–7.45)
pO2, Arterial: 99 mmHg (ref 83–108)

## 2022-02-21 LAB — URINALYSIS, COMPLETE (UACMP) WITH MICROSCOPIC
Bilirubin Urine: NEGATIVE
Glucose, UA: 50 mg/dL — AB
Ketones, ur: NEGATIVE mg/dL
Nitrite: NEGATIVE
Protein, ur: 100 mg/dL — AB
RBC / HPF: >50 RBC/hpf — ABNORMAL HIGH (ref 0–5)
Specific Gravity, Urine: 1.025 (ref 1.005–1.030)
WBC, UA: >50 WBC/hpf — ABNORMAL HIGH (ref 0–5)
pH: 5 (ref 5.0–8.0)

## 2022-02-21 LAB — CBC
HCT: 28.1 % — ABNORMAL LOW (ref 36.0–46.0)
Hemoglobin: 8.8 g/dL — ABNORMAL LOW (ref 12.0–15.0)
MCH: 27.9 pg (ref 26.0–34.0)
MCHC: 31.3 g/dL (ref 30.0–36.0)
MCV: 89.2 fL (ref 80.0–100.0)
Platelets: 101 10*3/uL — ABNORMAL LOW (ref 150–400)
RBC: 3.15 MIL/uL — ABNORMAL LOW (ref 3.87–5.11)
RDW: 15.2 % (ref 11.5–15.5)
WBC: 7.5 10*3/uL (ref 4.0–10.5)
nRBC: 0 % (ref 0.0–0.2)

## 2022-02-21 LAB — BASIC METABOLIC PANEL
Anion gap: 7 (ref 5–15)
BUN: 23 mg/dL (ref 8–23)
CO2: 18 mmol/L — ABNORMAL LOW (ref 22–32)
Calcium: 8 mg/dL — ABNORMAL LOW (ref 8.9–10.3)
Chloride: 120 mmol/L — ABNORMAL HIGH (ref 98–111)
Creatinine, Ser: 0.65 mg/dL (ref 0.44–1.00)
GFR, Estimated: >60 mL/min (ref >60)
Glucose, Bld: 185 mg/dL — ABNORMAL HIGH (ref 70–99)
Potassium: 3.8 mmol/L (ref 3.5–5.1)
Sodium: 145 mmol/L (ref 135–145)

## 2022-02-21 LAB — MAGNESIUM: Magnesium: 1.9 mg/dL (ref 1.7–2.4)

## 2022-02-21 LAB — GLUCOSE, CAPILLARY
Glucose-Capillary: 115 mg/dL — ABNORMAL HIGH (ref 70–99)
Glucose-Capillary: 172 mg/dL — ABNORMAL HIGH (ref 70–99)
Glucose-Capillary: 106 mg/dL — ABNORMAL HIGH (ref 70–99)
Glucose-Capillary: 110 mg/dL — ABNORMAL HIGH (ref 70–99)
Glucose-Capillary: 79 mg/dL (ref 70–99)
Glucose-Capillary: 79 mg/dL (ref 70–99)

## 2022-02-21 LAB — PHOSPHORUS: Phosphorus: 3 mg/dL (ref 2.5–4.6)

## 2022-02-21 MED ORDER — FENTANYL CITRATE (PF) 250 MCG/5ML IJ SOLN
INTRAMUSCULAR | Status: AC
  Filled 2022-02-21: qty 5

## 2022-02-21 MED ORDER — ORAL CARE MOUTH RINSE: 15.0000 mL | OROMUCOSAL | Status: DC | PRN

## 2022-02-21 MED ORDER — POTASSIUM CHLORIDE 20 MEQ PO PACK
40.0000 meq | PACK | Freq: Once | ORAL | Status: AC
  Administered 2022-02-21: 40 meq
  Filled 2022-02-21: qty 2

## 2022-02-21 MED ORDER — ASPIRIN 300 MG RE SUPP
300.0000 mg | Freq: Every day | RECTAL | Status: DC
  Administered 2022-02-21: 300 mg via RECTAL
  Filled 2022-02-21: qty 1

## 2022-02-21 MED ORDER — ORAL CARE MOUTH RINSE
15.0000 mL | OROMUCOSAL | Status: DC
  Administered 2022-02-21 – 2022-02-25 (×17): 15 mL via OROMUCOSAL

## 2022-02-21 MED ORDER — MIDAZOLAM HCL (PF) 10 MG/2ML IJ SOLN
INTRAMUSCULAR | Status: AC
  Filled 2022-02-21: qty 2

## 2022-02-21 MED ORDER — PROPOFOL 10 MG/ML IV BOLUS
INTRAVENOUS | Status: AC
  Filled 2022-02-21: qty 20

## 2022-02-21 NOTE — Progress Notes (Signed)
Pt transported to and from CT without event. Pt placed back on 5/5 40% wean mode on return.

## 2022-02-21 NOTE — TOC Progression Note (Signed)
Transition of Care [facility]) - Progression Note    Patient Details  Name: Shannon Chen MRN: [id number] Date of Birth: 12-19-1945  Transition of Care [facility]) CM/SW Contact  Tom-Johnson, Renea Ee, RN Phone Number: 02/21/2022, 2:48 PM  Clinical Narrative:     Patient is extubated today, on 2L O2. Awaiting PT/OT eval for disposition. CM will continue to follow.        Expected Discharge Plan and Services                                               Social Determinants of Health (SDOH) Interventions SDOH Screenings   Food Insecurity: No Food Insecurity (12/10/2021)  Housing: Medium Risk (12/10/2021)  Transportation Needs: No Transportation Needs (12/10/2021)  Utilities: Not At Risk (12/10/2021)  Tobacco Use: Medium Risk (02/21/2022)    Readmission Risk Interventions    12/10/2021    2:16 PM 12/15/2020    4:10 PM  Readmission Risk Prevention Plan  Transportation Screening Complete Complete  PCP or Specialist Appt within 5-7 Days  Complete  Home Care Screening  Complete  Medication Review (RN CM)  Complete  HRI or Home Care Consult Complete   Social Work Consult for [place] Planning/Counseling Complete   Palliative Care Screening Not Applicable   Medication Review Press photographer) Complete

## 2022-02-21 NOTE — Progress Notes (Signed)
NAME:  Shannon Chen, MRN:  [id number], DOB:  March 14, 1945, LOS: 3 ADMISSION DATE:  02/18/2022, CONSULTATION DATE: 02/19/2022 REFERRING MD: Dr. Lorrin Goodell, CHIEF COMPLAINT: Ventilator management  History of Present Illness:  Patient presented with sudden onset left-sided weakness, aphasia, right gaze deviation. Patient was found slumped over, not talking had been seen 15 minutes earlier Brought in for code stroke.  At baseline, requires some help, able to walk around with a walker  Had emergent thrombectomy Achieved TICI 2B and coil embolization of M3 MCA remain TICI 2A, related to branch embolization On vent postprocedure  Pertinent  Medical History   has a past medical history of Arthritis, AV block, Mobitz II, CAD (coronary artery disease), Carotid artery disease ([place]), Essential hypertension, History of stress test, Hyperlipidemia, Hypertrophic cardiomyopathy ([place]), and Presence of permanent cardiac pacemaker.   Significant Hospital Events: Including procedures, antibiotic start and stop dates in addition to other pertinent events   02/18/2022-thrombectomy   Interim History / Subjective:   Remains critically ill, intubated Off Cleviprex p Low-grade febrile 100.6 Tmax  Objective   Blood pressure (!) 156/62, pulse 88, temperature 98.4 F (36.9 C), temperature source Axillary, resp. rate (!) 24, weight 61.5 kg, SpO2 100 %.    Vent Mode: CPAP;PSV FiO2 (%):  [40 %] 40 % Set Rate:  [18 bmp] 18 bmp Vt Set:  [490 mL] 490 mL PEEP:  [5 cmH20] 5 cmH20 Pressure Support:  [5 cmH20] 5 cmH20   Intake/Output Summary (Last 24 hours) at 02/21/2022 0859 Last data filed at 02/21/2022 0800 Gross per 24 hour  Intake 1918.97 ml  Output 910 ml  Net 1008.97 ml    Filed Weights   02/19/22 0000 02/20/22 0500 02/21/22 0228  Weight: 60.5 kg 64.3 kg 61.5 kg    Examination:  General: Elderly female on vent HENT: [place]/AT, PERRL, no JVD Lungs: Clear, minimal secretions Cardiovascular: RRR, no  MRG Abdomen: Soft, NT, ND Extremities: No acute deformity. No collection  Neuro: Moving R side spontaneously and to command.  Does not move right foot left hemiplegia  CT angio Head and Neck with contrast: Right MCA M2 occlusion  CT perfusion: Core 72ml, penumbra 82ml.   1/3 MRI brain:  Large acute infarct in the right MCA distribution with edema and mild regional mass effect but no midline shift. Numerous additional smaller infarcts in the bilateral cerebral and cerebellar hemispheres are likely embolic in nature. Subarachnoid hemorrhage primarily in the right sylvian fissure. Numerous peripherally distributed punctate chronic micro hemorrhages suspicious for cerebral amyloid angiopathy.  Echo LVEF 70-75%, moderate LVH. Normal RV. Mild/mod MR.   Labs show normal electrolytes , normal renal function.  No Leukocytosis, slight drop in hemoglobin from 9.9-8.8     Resolved Hospital Problem list   AKI  Assessment & Plan:  Acute right MCA stroke with right MCA M2 occlusion -post thrombectomy with partial recanalization [id number] complicated by vessel rupture requiring vessel sacrifice with coil embolization  SAH S/p thrombectomy S/p coil embolization of M3 Achieved TICI 2A - Neuro checks - Neurology following - Keep SBP < 160 mmHg - Significant stroke deficits are anticipated.   Acute respiratory failure , intubated for procedure  - Wean as tolerated currently on pressure support 5/5 -One-way extubation per IPAL discussion on 1/4  Hypertension -  Cleviprex  off - Restart home toprol, amlodipine - keep SBP < 160 per [facility] protocol.   Type 2 diabetes - SSI  Chronic obstructive pulmonary disease without exacerbation - On Trelegy as outpatient -  As needed albuterol - Yupelri, brovana, budesonide.   History of coronary artery disease History of hypertrophic cardiomyopathy - Monitor     Best Practice (right click and "Reselect all SmartList Selections" daily)   Diet/type:  NPO TF DVT prophylaxis: LMWH GI prophylaxis: PPI Lines: N/A Foley:  N/A Code Status:  full code Last date of multidisciplinary goals of care discussion 1/4 >>DNR,plan for a one-way extubation.    Critical care time 32 minutes  Horice Carrero V. Elsworth Soho MD  See Shea [place] for personal pager PCCM on call pager [phone] until 7pm. Please call Elink 7p-7a. [phone]  02/21/2022 8:59 AM

## 2022-02-21 NOTE — Progress Notes (Signed)
SLP Cancellation Note  Patient Details Name: Shannon Chen MRN: [id number] DOB: [birth year]   Cancelled treatment:       Reason Eval/Treat Not Completed: Patient not medically ready. Pt remains on vent. SLP to sign off at this time - please reorder when ready.    Osie Bond., M.A. [place] Office [phone]  Secure chat preferred  02/21/2022, 7:49 AM

## 2022-02-21 NOTE — Progress Notes (Signed)
STROKE TEAM PROGRESS NOTE   INTERVAL HISTORY Patient  Extubated this morning and is breathing well but is having increased oral secretions requiring frequent suctioning.  He continues to have right gaze deviation and dense left hemiplegia.  Vital signs stable. Follow-up CT scan of the head this morning shows evolving right MCA infarct with small hyperdensity in the sylvian fissure. Vitals:   02/21/22 1100 02/21/22 1130 02/21/22 1141 02/21/22 1200  BP:  (!) 155/138  (!) 150/44  Pulse: 84 84  84  Resp: (!) 26 (!) 25  20  Temp:   98.5 F (36.9 C)   TempSrc:   Axillary   SpO2: 98% 100%  99%  Weight:       CBC:  Recent Labs  Lab 02/18/22 1940 02/18/22 2002 02/19/22 0612 02/20/22 0326 02/21/22 0654  WBC 9.7   < > 8.4 9.6 7.5  NEUTROABS 7.8*  --  6.9  --   --   HGB 11.3*   < > 9.2* 9.9* 8.8*  HCT 36.4   < > 29.9* 30.8* 28.1*  MCV 90.8   < > 88.2 89.3 89.2  PLT 74*   < > 101* 122* 101*   < > = values in this interval not displayed.   Basic Metabolic Panel:  Recent Labs  Lab 02/20/22 0326 02/20/22 1621 02/21/22 0654  NA 143  --  145  K 3.7  --  3.8  CL 117*  --  120*  CO2 17*  --  18*  GLUCOSE 158*  --  185*  BUN 31*  --  23  CREATININE 0.92  --  0.65  CALCIUM 7.8*  --  8.0*  MG 1.9 2.0 1.9  PHOS 3.3 2.8 3.0   Lipid Panel:  Recent Labs  Lab 02/19/22 0420 02/20/22 0326  CHOL 70  --   TRIG 134 140  HDL 12*  --   CHOLHDL 5.8  --   VLDL 27  --   LDLCALC 31  --    HgbA1c:  Recent Labs  Lab 02/19/22 0420  HGBA1C 7.1*   Urine Drug Screen: No results for input(s): "LABOPIA", "COCAINSCRNUR", "LABBENZ", "AMPHETMU", "THCU", "LABBARB" in the last 168 hours.  Alcohol Level  Recent Labs  Lab 02/18/22 2351  ETH <10    IMAGING past 24 hours CT HEAD WO CONTRAST (5MM)  Result Date: 02/21/2022 CLINICAL DATA:  Stroke, follow up EXAM: CT HEAD WITHOUT CONTRAST TECHNIQUE: Contiguous axial images were obtained from the base of the skull through the vertex without  intravenous contrast. RADIATION DOSE REDUCTION: This exam was performed according to the departmental dose-optimization program which includes automated exposure control, adjustment of the mA and/or kV according to patient size and/or use of iterative reconstruction technique. COMPARISON:  Flat panel CT from IR procedure 02/19/2022. MRI head from 02/19/2022. FINDINGS: Brain: Small amount of hyperdensity within the right sylvian fissure and overlying frontal convexity appears decreased relative to flat panel CT from 02/19/2022 interventional procedure. Evolving infarct in the right MCA distribution, better characterized on prior MRI. Embolization coils in this region. No hydrocephalus. No mass lesion. Vascular: See above. Skull: No acute fracture. Sinuses/Orbits: No acute findings. Other: No mastoid effusions. IMPRESSION: 1. 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 Therefore, this is favored to largely represent contrast staining; however, a small amount of subarachnoid hemorrhage is probable  given appearance on recent MRI. Heidelberg bleeding classification 3c. 2. No progressive mass effect. Electronically Signed   By: Margaretha Sheffield M.D.   On: 02/21/2022 09:01    PHYSICAL EXAM General: Frail elderly African-American lady in no acute distress Respiratory: Not labored breathing  neurological Exam): Awake alert aphasic.  Will not follow commands.  Eyes open and will regard examiner.  Pupils equal round and reactive to light, right gaze deviation and will not cross midline, does not blink to threat on the left.  Will move bilateral upper extremities spontaneously and on purposefully, will withdraw bilateral lower extremities to noxious stimuli.  ASSESSMENT/PLAN Shannon Chen is a 77  y.o. 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  TNK was not offered due to low platelet count, and mechanical thrombectomy was performed.  TICI2B flow was achieved in the right MCA, however procedure was complicated by vessel rupture requiring vessel sacrifice with coil embolization.  Patient remained intubated after procedure and was transferred to the ICU.  Stroke:  right MCA stroke due to occlusion of dominant right M2/MCA status post thrombectomy with partial recanalization [id number] complicated by vessel rupture requiring vessel sacrifice with coil embolization Etiology: Severe intracranial atherosclerotic disease Code Stroke CT head No acute abnormality.  Multiple old infarcts, chronic microvascular disease ASPECTS 10.    CTA head & neck occlusion of right M2 superior division branch, 60% stenosis of left ICA, severe stenosis of right V2 segment, moderate stenosis of basilar artery MRI large acute right MCA territory infarct, as well as numerous smaller infarcts in bilateral cerebral and cerebellar hemispheres, subarachnoid hemorrhage and right sylvian fissure, numerous chronic microhemorrhages suspicious for CAA 2D Echo EF 70 to 86%, grade 2 diastolic dysfunction, normal left atrial size, no atrial level shunt LDL 31 HgbA1c 6.4 VTE prophylaxis -Lovenox    Diet   Diet NPO time specified   aspirin 81 mg daily and clopidogrel 75 mg daily prior to admission, now on No antithrombotic, will evaluate with CT head in the morning and consider starting aspirin at that time. Therapy recommendations: Pending Disposition: Pending  Hypertension Home meds: Norvasc 2.5 mg daily Stable Keep systolic blood pressure 578-469 for first 24  hours, then less than 180 Long-term BP goal normotensive  Hyperlipidemia Home meds: Rosuvastatin 40 mg daily, resumed in hospital LDL 31, goal < 70 Continue statin at discharge  Diabetes type II Controlled Home meds: Farxiga 10 mg daily HgbA1c 6.4, goal < 7.0 CBGs Recent Labs    02/21/22 0322 02/21/22 0719 02/21/22 1140  GLUCAP 115* 172* 106*    SSI  Respiratory failure Patient left intubated after procedure Ventilator management per CCM Will attempt extubation today  Other Stroke Risk Factors Advanced Age >/= 67  Former cigarette smoker Hx stroke Coronary artery disease  Other Active Problems None  Hospital day # 3  Patient is extubated but will have difficult time protecting her airway due to increased oral secretions and inability to swallow.  Family has made her DNR and did not want reintubation.  Recommend core track tube placement for nourishment and medicines.  Start aspirin for stroke prevention.  Mobilize out of bed.  Physical Occupational Therapy and speech therapy consults.  No family available at the bedside for discussion.  Greater than 50% time during this 50-minute  visit was spent in counseling and coordination of care and discussion patient care team and answering questions.    Shannon Heady, MD Medical Director [facility] Pager: [phone] 02/21/2022 2:33 PM   To contact Stroke Continuity provider, please refer to [email or web address]. After hours, contact General Neurology

## 2022-02-21 NOTE — Progress Notes (Addendum)
Pt extubated one way to 2l [place] with RN at bedside per order. Pt's vitals stable.

## 2022-02-21 NOTE — Progress Notes (Signed)
Nutrition Follow-up  DOCUMENTATION CODES:   Non-severe (moderate) malnutrition in context of chronic illness  INTERVENTION:   Once NG tube placed and placement confirmed by x-ray, initiate tube feeds: - Osmolite 1.5 @ 50 ml/hr (1200 ml/day) - PROSource TF20 60 ml daily  Tube feeding regimen provides 1880 kcal, 95 grams of protein, and 914 ml of H2O.   NUTRITION DIAGNOSIS:   Moderate Malnutrition related to chronic illness as evidenced by mild fat depletion, moderate muscle depletion.  Ongoing, being addressed via TF  GOAL:   Patient will meet greater than or equal to 90% of their needs  Met via TF  MONITOR:   Diet advancement, Labs, Weight trends, TF tolerance, Skin  REASON FOR ASSESSMENT:   Ventilator, Consult Enteral/tube feeding initiation and management  ASSESSMENT:   77 year old female who presented to the ED on 1/02 from group home as a Code Stroke. PMH of CAD, heart block s/p PPM, HTN, HLD, R carotid endarterectomy, kidney mass, PAD, T2DM. Pt admitted with acute R MCA stroke with R MCA M2 occlusion.  01/02 - s/p mechanical thrombectomy, endovascular vessel embolization 01/05 - extubated  Discussed pt with RN. One-way extubation earlier today. Pt is NPO. Cortrak order in place. Due to high volume of Cortrak consults, Cortrak team unable to place Cortrak today. Recommend RN place NG tube at bedside and obtain abdominal x-ray to confirm placement. Discussed recommendation with RN via phone call. Tube feeding orders in place once enteral access is obtained.  Admit weight: 61.8 kg Current weight: 61.5 kg  Medications reviewed and include: vitamin B12 1000 mcg daily, colace, folic acid, SSI q 4 hours, IV protonix, miralax  Labs reviewed: hemoglobin 8.8, platelets 101 CBG's: 96-172 x 24 hours  UOP: 1010 ml x 24 hours I/O's: +4.0 L since admit  Diet Order:   Diet Order             Diet NPO time specified  Diet effective now                    EDUCATION NEEDS:   Not appropriate for education at this time  Skin:  Skin Assessment: Reviewed RN Assessment (wound to L thigh)  Last BM:  02/20/22 smear  Height:   Ht Readings from Last 1 Encounters:  02/06/22 5' 5.5" (1.664 m)    Weight:   Wt Readings from Last 1 Encounters:  02/21/22 61.5 kg    Ideal Body Weight:  58 kg  BMI:  Body mass index is 22.22 kg/m.  Estimated Nutritional Needs:   Kcal:  1800-2000  Protein:  90-105 grams  Fluid:  1.8-2.0 L    Gustavus Bryant, MS, RD, LDN Inpatient Clinical Dietitian Please see [facility] for contact information.

## 2022-02-22 ENCOUNTER — Inpatient Hospital Stay (HOSPITAL_COMMUNITY): Payer: Medicare HMO

## 2022-02-22 DIAGNOSIS — R509 Fever, unspecified: Secondary | ICD-10-CM | POA: Diagnosis not present

## 2022-02-22 DIAGNOSIS — J9611 Chronic respiratory failure with hypoxia: Secondary | ICD-10-CM | POA: Diagnosis not present

## 2022-02-22 DIAGNOSIS — N390 Urinary tract infection, site not specified: Secondary | ICD-10-CM

## 2022-02-22 DIAGNOSIS — I63511 Cerebral infarction due to unspecified occlusion or stenosis of right middle cerebral artery: Secondary | ICD-10-CM | POA: Diagnosis not present

## 2022-02-22 LAB — CBC
HCT: 30.8 % — ABNORMAL LOW (ref 36.0–46.0)
Hemoglobin: 9.3 g/dL — ABNORMAL LOW (ref 12.0–15.0)
MCH: 27.2 pg (ref 26.0–34.0)
MCHC: 30.2 g/dL (ref 30.0–36.0)
MCV: 90.1 fL (ref 80.0–100.0)
Platelets: 136 10*3/uL — ABNORMAL LOW (ref 150–400)
RBC: 3.42 MIL/uL — ABNORMAL LOW (ref 3.87–5.11)
RDW: 15.1 % (ref 11.5–15.5)
WBC: 8.5 10*3/uL (ref 4.0–10.5)
nRBC: 0 % (ref 0.0–0.2)

## 2022-02-22 LAB — GLUCOSE, CAPILLARY
Glucose-Capillary: 139 mg/dL — ABNORMAL HIGH (ref 70–99)
Glucose-Capillary: 147 mg/dL — ABNORMAL HIGH (ref 70–99)
Glucose-Capillary: 162 mg/dL — ABNORMAL HIGH (ref 70–99)
Glucose-Capillary: 167 mg/dL — ABNORMAL HIGH (ref 70–99)
Glucose-Capillary: 212 mg/dL — ABNORMAL HIGH (ref 70–99)
Glucose-Capillary: 96 mg/dL (ref 70–99)

## 2022-02-22 LAB — BASIC METABOLIC PANEL WITH GFR
Anion gap: 13 (ref 5–15)
BUN: 19 mg/dL (ref 8–23)
CO2: 15 mmol/L — ABNORMAL LOW (ref 22–32)
Calcium: 8.4 mg/dL — ABNORMAL LOW (ref 8.9–10.3)
Chloride: 118 mmol/L — ABNORMAL HIGH (ref 98–111)
Creatinine, Ser: 0.64 mg/dL (ref 0.44–1.00)
GFR, Estimated: >60 mL/min
Glucose, Bld: 152 mg/dL — ABNORMAL HIGH (ref 70–99)
Potassium: 4.1 mmol/L (ref 3.5–5.1)
Sodium: 146 mmol/L — ABNORMAL HIGH (ref 135–145)

## 2022-02-22 LAB — PHOSPHORUS: Phosphorus: 3.4 mg/dL (ref 2.5–4.6)

## 2022-02-22 LAB — MAGNESIUM: Magnesium: 2.1 mg/dL (ref 1.7–2.4)

## 2022-02-22 MED ORDER — SODIUM CHLORIDE 0.9 % IV SOLN
1.0000 g | INTRAVENOUS | Status: DC
  Administered 2022-02-22 – 2022-02-24 (×3): 1 g via INTRAVENOUS
  Filled 2022-02-22 (×4): qty 10

## 2022-02-22 MED ORDER — ASPIRIN 325 MG PO TABS
325.0000 mg | ORAL_TABLET | Freq: Every day | ORAL | Status: DC
  Administered 2022-02-22 – 2022-02-25 (×4): 325 mg
  Filled 2022-02-22 (×4): qty 1

## 2022-02-22 MED ORDER — FAMOTIDINE 20 MG PO TABS
20.0000 mg | ORAL_TABLET | Freq: Every day | ORAL | Status: DC
  Administered 2022-02-22 – 2022-02-25 (×4): 20 mg
  Filled 2022-02-22 (×4): qty 1

## 2022-02-22 MED ORDER — ROSUVASTATIN CALCIUM 20 MG PO TABS
20.0000 mg | ORAL_TABLET | Freq: Every day | ORAL | Status: DC
  Administered 2022-02-23 – 2022-02-25 (×3): 20 mg
  Filled 2022-02-22 (×3): qty 1

## 2022-02-22 MED ORDER — OSMOLITE 1.5 CAL PO LIQD
1000.0000 mL | ORAL | Status: DC
  Administered 2022-02-22 – 2022-02-25 (×4): 1000 mL
  Filled 2022-02-22 (×2): qty 1000

## 2022-02-22 NOTE — Progress Notes (Signed)
eLink Physician-Brief Progress Note Patient Name: Shannon Chen DOB: 11/07/1945 MRN: [id number]   Date of Service  02/22/2022  HPI/Events of Note  Nursing request to review abdominal film for NGT placement and write orders for enteral nutrition. Review of abdominal film reveals NGT has been advanced, now NGT tip within the gastric antrum.   eICU Interventions  Plan: OK to use NGT. Osmolite 1.5 per tube at 25-50 mL/hour.     Intervention Category Major Interventions: Other:  Lysle Dingwall 02/22/2022, 12:43 AM

## 2022-02-22 NOTE — Evaluation (Signed)
Physical Therapy Evaluation Patient Details Name: Shannon Chen MRN: [id number] DOB: [birth year] Today's Date: 02/22/2022  History of Present Illness  Pt is a 77 y.o. female admitted from group home on 02/18/22 with acute onset L-side weakness, aphasia, R gaze deviation. Workup revealed R M2/MCA occlusion; s/p mechanical thrombectomy and clot embolization. ETT 1/2-1/5. PMH includes CAD, HTN, HLD, cardiomyopathy, pacemaker, arthritis.   Clinical Impression  Pt presents with an overall decrease in functional mobility secondary to above. Per chart, pt resident at group home, typically ambulatory with RW, assist from caregiver for ADL/iADLs as needed. Today, pt requiring totalA for all aspects of mobility and care. Pt with restless RUE movement, trace R ankle/quad movement; no active L-side movement noted beyond some withdrawal to painful stimuli. Pt awake but not visually tracking or following commands. Will trial acute PT services to maximize functional mobility; pt will require extensive post-acute rehab pending [place] discussions.      Recommendations for follow up therapy are one component of a multi-disciplinary discharge planning process, led by the attending physician.  Recommendations may be updated based on patient status, additional functional criteria and insurance authorization.  Follow Up Recommendations Skilled nursing-short term rehab (<3 hours/day) Can patient physically be transported by private vehicle: No    Assistance Recommended at Discharge Frequent or constant Supervision/Assistance  Patient can return home with the following  Two people to help with walking and/or transfers;Two people to help with bathing/dressing/bathroom;Assistance with feeding    Equipment Recommendations  (TBD)  Recommendations for Other Services       Functional Status Assessment Patient has had a recent decline in their functional status and demonstrates the ability to make significant improvements in  function in a reasonable and predictable amount of time.     Precautions / Restrictions Precautions Precautions: Fall;Other (comment) Precaution Comments: L side inattention, cortrak, R hand mitten Restrictions Weight Bearing Restrictions: No      Mobility  Bed Mobility Overal bed mobility: Needs Assistance             General bed mobility comments: totalA to scoot up and reposition to midline in bed; bed placed in chair position though pt unable to maintain cervical extension with this, therefore HOB reduced to 45'    Transfers                        Ambulation/Gait                  Stairs            Wheelchair Mobility    Modified Rankin (Stroke Patients Only)       Balance Overall balance assessment: Needs assistance     Sitting balance - Comments: totalA to achieve and maintain long sitting from elevated HOB                                     Pertinent Vitals/Pain Pain Assessment Pain Assessment: Faces Faces Pain Scale: Hurts a little bit Pain Location: pain response in all four extremities Pain Descriptors / Indicators: Guarding, Grimacing Pain Intervention(s): Monitored during session    Home Living Family/patient expects to be discharged to:: Group home Living Arrangements: Group Home Available Help at Discharge: Available 24 hours/day               Additional Comments: per chart, pt resident at group home  Prior Function Prior Level of Function : Needs assist             Mobility Comments: from PT Evaluation note 12/2021, pt was indep without DME prior to admission; per chart, ambulatory with RW since then ADLs Comments: per chart, pt requires assist with bathing, dressing, meal prep and finances since admission d/c home from hospital 01/2022     Hand Dominance        Extremity/Trunk Assessment   Upper Extremity Assessment Upper Extremity Assessment: RUE deficits/detail;LUE  deficits/detail;Difficult to assess due to impaired cognition RUE Deficits / Details: restless with R hand/wrist/elbow motion, though unable to bring hand all the way up to face LUE Deficits / Details: no active movement noted in LUE beyond response to painful stimuli; notable tone    Lower Extremity Assessment Lower Extremity Assessment: RLE deficits/detail;LLE deficits/detail;Difficult to assess due to impaired cognition RLE Deficits / Details: trace R ankle/toes and quad movement noted; reactive to painful stimuli on nailbed LLE Deficits / Details: no active movement noted in LLE other than slight reaction to painful stimuli to nailbed    Cervical / Trunk Assessment Cervical / Trunk Assessment: Other exceptions Cervical / Trunk Exceptions: no significant trunk/neck muscle activation noted  Communication   Communication: Expressive difficulties;Receptive difficulties  Cognition Arousal/Alertness: Awake/alert Behavior During Therapy: Flat affect Overall Cognitive Status: Difficult to assess                                 General Comments: no command following noted. pt restless with RUE, seems to be attempting to pull at [facility], also increased RUE movement with stimuli elsewhere. pt initially visually attending to therapist on both sides upon entering room and mouthing word, but unable to recreate any visual tracking after this brief moment. responds to painful stimuli in all four extremities        General Comments General comments (skin integrity, edema, etc.): SpO2 down to 94% on RA, replaced 2L O2 [place], HR 100s; unsure reliable high BP reading as pt frequently moving RUE. pt with significant gurgling, unable to clear secretions requiring frequent suction during session    Exercises     Assessment/Plan    PT Assessment Patient needs continued PT services  PT Problem List Decreased strength;Decreased range of motion;Decreased activity tolerance;Decreased  balance;Decreased mobility;Decreased cognition;Decreased coordination;Cardiopulmonary status limiting activity;Impaired sensation;Impaired tone       PT Treatment Interventions Functional mobility training;Therapeutic activities;Therapeutic exercise;Balance training;Neuromuscular re-education;Cognitive remediation;Patient/family education;Wheelchair mobility training    PT Goals (Current goals can be found in the Care Plan section)  Acute Rehab PT Goals PT Goal Formulation: Patient unable to participate in goal setting Time For Goal Achievement: 03/08/22 Potential to Achieve Goals: Poor    Frequency Min 2X/week     Co-evaluation PT/OT/SLP Co-Evaluation/Treatment: Yes Reason for Co-Treatment: Complexity of the patient's impairments (multi-system involvement);Necessary to address cognition/behavior during functional activity;For patient/therapist safety;To address functional/ADL transfers           AM-PAC PT "6 Clicks" Mobility  Outcome Measure Help needed turning from your back to your side while in a flat bed without using bedrails?: Total Help needed moving from lying on your back to sitting on the side of a flat bed without using bedrails?: Total Help needed moving to and from a bed to a chair (including a wheelchair)?: Total Help needed standing up from a chair using your arms (e.g., wheelchair or bedside chair)?: Total Help needed  to walk in hospital room?: Total Help needed climbing 3-5 steps with a railing? : Total 6 Click Score: 6    End of Session Equipment Utilized During Treatment: Oxygen Activity Tolerance: Treatment limited secondary to medical complications (Comment) Patient left: in bed;with call bell/phone within reach;with bed alarm set Nurse Communication: Mobility status PT Visit Diagnosis: Other abnormalities of gait and mobility (R26.89);Other symptoms and signs involving the nervous system (R29.898);Hemiplegia and hemiparesis Hemiplegia - Right/Left:  Left Hemiplegia - caused by: Cerebral infarction    Time: 1440-1504 PT Time Calculation (min) (ACUTE ONLY): 24 min   Charges:   PT Evaluation $PT Eval Moderate Complexity: 1 Mod         Shannon Chen, PT, DPT Acute Rehabilitation Services  Personal: Secure Chat Rehab Office: [phone]  Shannon Chen 02/22/2022, 3:31 PM

## 2022-02-22 NOTE — Evaluation (Signed)
Occupational Therapy Evaluation Patient Details Name: Shannon Chen MRN: [id number] DOB: 1945-06-29 Today's Date: 02/22/2022   History of Present Illness Pt is a 77 y.o. female admitted from group home on 02/18/22 with acute onset L-side weakness, aphasia, R gaze deviation. Workup revealed R M2/MCA occlusion; s/p mechanical thrombectomy and clot embolization. ETT 1/2-1/5. PMH includes CAD, HTN, HLD, cardiomyopathy, pacemaker, arthritis.   Clinical Impression   Per chart, prior to admission Pt was at a group home. Pt was using a RW for mobility, and while she had a caregiver to assist with ADL/IADL she was performing them. Today Pt is total A for all aspects of ADL and total A +2 for bed mobility. She sometimes seems to be intentional with RUE tapping and movement increases in RUE in response to pain stimulus in all four extremities, or when therapists was face or provide suction. She did not follow any commands throughout session. OT will follow acutely, and recommending SNF post-acute.      Recommendations for follow up therapy are one component of a multi-disciplinary discharge planning process, led by the attending physician.  Recommendations may be updated based on patient status, additional functional criteria and insurance authorization.   Follow Up Recommendations  Skilled nursing-short term rehab (<3 hours/day)     Assistance Recommended at Discharge Frequent or constant Supervision/Assistance  Patient can return home with the following Two people to help with walking and/or transfers;Two people to help with bathing/dressing/bathroom    Functional Status Assessment  Patient has had a recent decline in their functional status and/or demonstrates limited ability to make significant improvements in function in a reasonable and predictable amount of time  Equipment Recommendations  Other (comment) (TBA)    Recommendations for Other Services Other (comment) (Palliative)      Precautions / Restrictions Precautions Precautions: Fall;Other (comment) Precaution Comments: L side inattention, cortrak, R hand mitten Restrictions Weight Bearing Restrictions: No      Mobility Bed Mobility Overal bed mobility: Needs Assistance             General bed mobility comments: totalA to scoot up and reposition to midline in bed; bed placed in chair position though pt unable to maintain cervical extension with this, therefore HOB reduced to 45'    Transfers                   General transfer comment: NT this session      Balance Overall balance assessment: Needs assistance     Sitting balance - Comments: totalA to achieve and maintain long sitting from elevated HOB                                   ADL either performed or assessed with clinical judgement   ADL Overall ADL's : Needs assistance/impaired                                       General ADL Comments: total A at this time     Vision   Vision Assessment?: Vision impaired- to be further tested in functional context Additional Comments: initially Pt with intentional gaze to the left (when therapists first entered room, however unable to replicate, intentional gaze to the R waxed and wained, tracking to midline 25% of the time     Perception  Praxis      Pertinent Vitals/Pain Pain Assessment Pain Assessment: Faces Faces Pain Scale: Hurts a little bit Pain Location: pain response in all four extremities Pain Descriptors / Indicators: Guarding, Grimacing Pain Intervention(s): Monitored during session     Hand Dominance Right   Extremity/Trunk Assessment Upper Extremity Assessment Upper Extremity Assessment: RUE deficits/detail;LUE deficits/detail RUE Deficits / Details: restless with R hand/wrist/elbow motion, though unable to bring hand all the way up to face, PROM WFL LUE Deficits / Details: no active movement noted in LUE beyond response to  painful stimuli; notable tone, full PROM without suggestion of pain LUE Coordination: decreased fine motor;decreased gross motor   Lower Extremity Assessment Lower Extremity Assessment: Defer to PT evaluation RLE Deficits / Details: trace R ankle/toes and quad movement noted; reactive to painful stimuli on nailbed LLE Deficits / Details: no active movement noted in LLE other than slight reaction to painful stimuli to nailbed   Cervical / Trunk Assessment Cervical / Trunk Assessment: Other exceptions Cervical / Trunk Exceptions: no significant trunk/neck muscle activation noted   Communication Communication Communication: Expressive difficulties;Receptive difficulties   Cognition Arousal/Alertness: Awake/alert Behavior During Therapy: Flat affect Overall Cognitive Status: Difficult to assess                                 General Comments: no command following noted. pt restless with RUE, seems to be attempting to pull at [facility], also increased RUE movement with stimuli elsewhere. pt initially visually attending to therapist on both sides upon entering room and mouthing word, but unable to recreate any visual tracking after this brief moment. responds to painful stimuli in all four extremities     General Comments  SpO2 down to 94% on RA, replaced 2L O2 [place], HR 100s; unsure reliable high BP reading as pt frequently moving RUE. pt with significant gurgling, unable to clear secretions requiring frequent suction during session    Exercises Exercises: Other exercises Other Exercises Other Exercises: full PROM of BUE   Shoulder Instructions      Home Living Family/patient expects to be discharged to:: Group home Living Arrangements: Group Home Available Help at Discharge: Available 24 hours/day                             Additional Comments: per chart, pt resident at group home      Prior Functioning/Environment Prior Level of Function : Needs assist              Mobility Comments: from PT Evaluation note 12/2021, pt was indep without DME prior to admission; per chart, ambulatory with RW since then ADLs Comments: per chart, pt requires assist with bathing, dressing, meal prep and finances since admission d/c home from hospital 01/2022        OT Problem List:        OT Treatment/Interventions:      OT Goals(Current goals can be found in the care plan section) Acute Rehab OT Goals Patient Stated Goal: none stated OT Goal Formulation: Patient unable to participate in goal setting Time For Goal Achievement: 03/08/22 Potential to Achieve Goals: Poor ADL Goals Pt Will Perform Grooming: with mod assist;bed level Additional ADL Goal #1: Pt will track to the left side 50% of the time Additional ADL Goal #2: Pt will perform bed mobility prior to engaging in ADL at mod A +2  OT  Frequency:      Co-evaluation PT/OT/SLP Co-Evaluation/Treatment: Yes Reason for Co-Treatment: Complexity of the patient's impairments (multi-system involvement);Necessary to address cognition/behavior during functional activity;For patient/therapist safety;To address functional/ADL transfers PT goals addressed during session: Balance;Strengthening/ROM OT goals addressed during session: ADL's and self-care;Strengthening/ROM      AM-PAC OT "6 Clicks" Daily Activity     Outcome Measure                 End of Session Nurse Communication: Mobility status;Need for lift equipment;Other (comment) (O2 levels on RA during session (but O2 replaced at EOS))  Activity Tolerance: Other (comment) (Evaluation completed, but limited by decreased cognition) Patient left: in bed (HOB 45 degrees)                   Time: 6734-1937 OT Time Calculation (min): 24 min Charges:  OT General Charges $OT Visit: 1 Visit OT Evaluation $OT Eval Moderate Complexity: 1 Mod Nyoka Cowden OTR/L Acute Rehabilitation Services Office: [phone]   Emelda Fear 02/22/2022, 5:11  PM

## 2022-02-22 NOTE — Progress Notes (Addendum)
STROKE TEAM PROGRESS NOTE   INTERVAL HISTORY Pt in bed, no family at bedside.  Does not respond to any questions.  Withdraws from pain on L side, more purposeful movements on R.  R gaze continues.  Patient was extubated yesterday and is breathing well but is having increased oral secretions requiring frequent suctioning. She is likely aspirating all secretions as she cannot swallow.  She continues to have right gaze deviation and dense left hemiplegia.  Vital signs stable.  Could consider pacemaker interrogation for question of afib.  After family discussion yesterday with CCM, one way extubation, coded DNR and tube feeds initiated.   Dr. Roda Shutters spoke with nephew today, "continue what we do now, no escalation of care, if breathing getting worse, will consider comfort care measures. if getting better, will do SNF."  Also treating for UTI(+) today with Rocephin.   Vitals:   02/22/22 1130 02/22/22 1200 02/22/22 1216 02/22/22 1230  BP:  (!) 133/41    Pulse: 84 81  85  Resp: 20 (!) 21  (!) 22  Temp:   100.1 F (37.8 C)   TempSrc:   Axillary   SpO2: 98% 99%  98%  Weight:       CBC:  Recent Labs  Lab 02/18/22 1940 02/18/22 2002 02/19/22 0612 02/20/22 0326 02/21/22 0654 02/22/22 1043  WBC 9.7   < > 8.4   < > 7.5 8.5  NEUTROABS 7.8*  --  6.9  --   --   --   HGB 11.3*   < > 9.2*   < > 8.8* 9.3*  HCT 36.4   < > 29.9*   < > 28.1* 30.8*  MCV 90.8   < > 88.2   < > 89.2 90.1  PLT 74*   < > 101*   < > 101* 136*   < > = values in this interval not displayed.   Basic Metabolic Panel:  Recent Labs  Lab 02/21/22 0654 02/22/22 0900 02/22/22 0901  NA 145  --  146*  K 3.8  --  4.1  CL 120*  --  118*  CO2 18*  --  15*  GLUCOSE 185*  --  152*  BUN 23  --  19  CREATININE 0.65  --  0.64  CALCIUM 8.0*  --  8.4*  MG 1.9 2.1  --   PHOS 3.0 3.4  --    Lipid Panel:  Recent Labs  Lab 02/19/22 0420 02/20/22 0326  CHOL 70  --   TRIG 134 140  HDL 12*  --   CHOLHDL 5.8  --   VLDL 27  --    LDLCALC 31  --    HgbA1c:  Recent Labs  Lab 02/19/22 0420  HGBA1C 7.1*   Urine Drug Screen: No results for input(s): "LABOPIA", "COCAINSCRNUR", "LABBENZ", "AMPHETMU", "THCU", "LABBARB" in the last 168 hours.  Alcohol Level  Recent Labs  Lab 02/18/22 2351  ETH <10    IMAGING past 24 hours DG CHEST PORT 1 VIEW  Result Date: 02/22/2022 CLINICAL DATA:  Fever, short of breath EXAM: PORTABLE CHEST 1 VIEW COMPARISON:  CT scan of the chest 02/18/2022; abdominal radiograph yesterday FINDINGS: Gastric tube remains present. Left subclavian approach cardiac rhythm maintenance device with leads projecting over the right atrium and right ventricle. Atherosclerotic calcifications present in the transverse aorta. Stable cardiomegaly. Nonspecific mild patchy airspace opacity in the left lung base. Trace blunting of the left costophrenic angle. Small effusion suspected. No pulmonary edema. No pneumothorax.  No acute osseous abnormality. IMPRESSION: Mild patchy/streaky airspace opacity in the left lung base may reflect atelectasis or infiltrate. Suspect small left pleural effusion. Electronically Signed   By: Malachy Moan M.D.   On: 02/22/2022 08:06   DG Abd Portable 1V  Result Date: 02/21/2022 CLINICAL DATA:  Nasogastric tube placement EXAM: PORTABLE ABDOMEN - 1 VIEW COMPARISON:  02/21/2022 FINDINGS: Since the prior examination, the nasogastric tube is been advanced and its tip is now seen in the expected gastric antrum. Visualized abdominal gas pattern is nonobstructive. No gross free intraperitoneal gas within the visualized abdomen. Visualized lung bases are clear. Cardiac size within normal limits. Pacemaker leads noted within the right heart. Aortic stent graft is partially visualized. IMPRESSION: 1. Nasogastric tube tip advanced, now within the gastric antrum. Electronically Signed   By: Helyn Numbers M.D.   On: 02/21/2022 19:09   DG Abd Portable 1V  Result Date: 02/21/2022 CLINICAL DATA:   Orogastric tube placement. EXAM: PORTABLE ABDOMEN - 1 VIEW COMPARISON:  Radiograph 02/19/2022 FINDINGS: Tip of the enteric tube is below the diaphragm in the stomach. The side-port is in the region of the gastroesophageal junction. Nonspecific but nonobstructive upper abdominal bowel gas pattern. Aorto bi-iliac stent graft in place. IMPRESSION: Tip of the enteric tube below the diaphragm in the stomach. The side-port is in the region of the gastroesophageal junction. Advancement of at least 3 cm would place the side-port below the diaphragm. Electronically Signed   By: Narda Rutherford M.D.   On: 02/21/2022 17:44    PHYSICAL EXAM General: no acute distress Respiratory: Not labored breathing  neurological Exam:  Awake alert aphasic.  Will not follow commands.   Eyes open and will regard examiner.  Pupils equal round and reactive to light, right gaze deviation and will not cross midline, does not blink to threat on the left.   Will move bilateral upper extremities spontaneously and on purposefully, will withdraw bilateral lower extremities to noxious stimuli.  ASSESSMENT/PLAN Shannon Chen is a 77 y.o. 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  TNK was not offered due to low platelet count, and mechanical thrombectomy was performed.  TICI2B flow was achieved in the right MCA, however procedure was complicated by vessel rupture requiring vessel sacrifice with coil embolization.  Patient remained intubated after procedure and was transferred to the ICU.  Pt now extubated on room air but likely aspirating, requiring regular suctioning.  Also UTI(+) with Rocephin.  Will have pacemaker interrogated to look for possible afib as  cause for stroke.  Transferring out of ICU.  Will monitor for improvement and transfer to SNF.  If condition begins to worsen we can discuss Comfort Care as pt had a one way extubation and is now DNR.  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  Multiple old infarcts.   CTA head & neck occlusion of right M2 superior division branch, 60% stenosis of left ICA, severe stenosis of right V2 segment, moderate stenosis of basilar artery MRI large acute right MCA territory infarct, as well as numerous smaller infarcts in bilateral cerebral and cerebellar  hemispheres, subarachnoid hemorrhage at right sylvian fissure, numerous chronic microhemorrhages suspicious for CAA Repeat CT scan 02/21/22 showed evolving right MCA infarct with small hyperdensity in the sylvian fissure. 2D Echo EF 70 to 75%, normal left atrial size, no atrial level shunt Pacemaker interrogation pending LDL 31 HgbA1c 6.4 VTE prophylaxis -Lovenox aspirin 81 mg daily and clopidogrel 75 mg daily prior to admission, now on ASA 325 Therapy recommendations: SNF Disposition: Pending, discussed with POA nephew [name], no escalation of care, if decline, will consider comfort care   Respiratory failure Fever  Remain intubated post IR One way extubation 02/21/22, on [place] Copious secretion Tmax 100.5->100.4 On rocephin   UTI Fever 100.5->100.4 UA WBC > 50 On rocephin Urine culture pending  Carotid stenosis S/p right CEA in the past CTA head and neck - Left ICA 60% On ASA now  Hypertension Home meds: Norvasc 2.5 mg daily Stable On norvasc 2.5mg  and metoprolol 25mg  bid Long-term BP goal normotensive  Hyperlipidemia Home meds: Rosuvastatin 40 mg daily, resumed in hospital LDL 31, goal < 70 Continue statin at  discharge  Dysphagia  NPO NG tube in place On TF@ 50  Other Stroke Risk Factors Advanced Age >/= 86  Former cigarette smoker Hx stroke Coronary artery disease 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  Other Active Problems Thrombocytopenia platelet 86->101 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 MD to update note as needed.   Dellia Beckwith, [facility] Hospitalist [phone] cell   ATTENDING NOTE: I reviewed above note and agree with the assessment and plan. Pt was seen and examined.   77 year old female with history of CAD, AV block status post pacemaker, hypertension, hyperlipidemia, right CEA, PAD, kidney mass admitted for left-sided weakness, left facial droop, right gaze and aphasia.  CT showed no acute finding but old infarcts.  CT head and neck right M2 occlusion, left ICA 60% stenosis, right V2 severe stenosis, basilar artery moderate stenosis.  Status post IR with TICI2b however underwent coil embolization due to vessel rupture.  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  EF 70 to 75%.  LDL 31, A1c 6.4.  Repeat CT 02/21/2022 stable infarct and SAH.  Creatinine 0.65.  Hemoglobin 8.8, platelet 86-101.  UA WBC more than 50, urine culture pending.  On exam, no family is at the bedside. Pt awake, alert, eyes open, nonverbal and not following commands. Not able to name and repeat per request. Right gaze preference and not cross midline, not blinking to visual threat on the left but able to blink on the right. Left facial droop. Tongue protrusion not cooperative. RUE spontaneous movement against gravity. LUE flaccid. BLEs 2/5 withdraw to pain. Sensation, coordination and gait not tested.   Etiology for patient's stroke likely due to large vessel atherosclerosis given multifocal intracranial  stenosis.  Now on aspirin 325 in the setting of SAH and anemia.  Low-grade fever, UA showed UTI, on Rocephin.  BP stable, on Norvasc and metoprolol.  Decrease Crestor 40-20 given low LDL.  Continue tube feeding.  Discussed with POA [name] nephew over the phone, patient had one-way sedation 02/20/2022, currently has copious secretions but still maintain O2 sats on nasal cannula.  No escalation of care at this time, if respiratory or neuro decline, will consider comfort care measures at that time.  PT/OT recommend SNF.  For detailed assessment and plan, please refer to above/below as I have made changes wherever appropriate.   Marvel Plan, MD PhD Stroke Neurology 02/22/2022 3:23 PM  This patient is critically ill due to large right MCA stroke, status post IR and coil embolization due to [facility], respiratory failure with one-way extubation and at significant risk of neurological worsening, death form respiratory failure, recurrent stroke, hemorrhagic transformation, vasospasm, cerebral edema, sepsis. This patient's care requires constant monitoring of vital signs, hemodynamics, respiratory and cardiac monitoring, review of multiple databases, neurological assessment, discussion with family, other specialists and medical decision making of high complexity. I spent 40 minutes of neurocritical care time in the care of this patient. I had long discussion with nephew Zollie Beckers over the phone, updated pt current condition, treatment plan and potential prognosis, and answered all the questions.  He expressed understanding and appreciation.      To contact Stroke Continuity provider, please refer to [email or web address]. After hours, contact General Neurology.If 7pm- 7am, please page neurology on call as listed in AMION.

## 2022-02-23 DIAGNOSIS — I63511 Cerebral infarction due to unspecified occlusion or stenosis of right middle cerebral artery: Secondary | ICD-10-CM | POA: Diagnosis not present

## 2022-02-23 LAB — CBC
HCT: 36 % (ref 36.0–46.0)
Hemoglobin: 10.7 g/dL — ABNORMAL LOW (ref 12.0–15.0)
MCH: 26.9 pg (ref 26.0–34.0)
MCHC: 29.7 g/dL — ABNORMAL LOW (ref 30.0–36.0)
MCV: 90.5 fL (ref 80.0–100.0)
Platelets: 143 10*3/uL — ABNORMAL LOW (ref 150–400)
RBC: 3.98 MIL/uL (ref 3.87–5.11)
RDW: 15.2 % (ref 11.5–15.5)
WBC: 8.8 10*3/uL (ref 4.0–10.5)
nRBC: 0 % (ref 0.0–0.2)

## 2022-02-23 LAB — URINE CULTURE: Special Requests: NORMAL

## 2022-02-23 LAB — GLUCOSE, CAPILLARY
Glucose-Capillary: 157 mg/dL — ABNORMAL HIGH (ref 70–99)
Glucose-Capillary: 154 mg/dL — ABNORMAL HIGH (ref 70–99)
Glucose-Capillary: 169 mg/dL — ABNORMAL HIGH (ref 70–99)
Glucose-Capillary: 177 mg/dL — ABNORMAL HIGH (ref 70–99)
Glucose-Capillary: 201 mg/dL — ABNORMAL HIGH (ref 70–99)

## 2022-02-23 LAB — BASIC METABOLIC PANEL
Anion gap: 13 (ref 5–15)
BUN: 23 mg/dL (ref 8–23)
CO2: 22 mmol/L (ref 22–32)
Calcium: 8.7 mg/dL — ABNORMAL LOW (ref 8.9–10.3)
Chloride: 114 mmol/L — ABNORMAL HIGH (ref 98–111)
Creatinine, Ser: 0.88 mg/dL (ref 0.44–1.00)
GFR, Estimated: >60 mL/min (ref >60)
Glucose, Bld: 186 mg/dL — ABNORMAL HIGH (ref 70–99)
Potassium: 4.1 mmol/L (ref 3.5–5.1)
Sodium: 149 mmol/L — ABNORMAL HIGH (ref 135–145)

## 2022-02-23 MED ORDER — SODIUM CHLORIDE 0.9 % IV SOLN
250.0000 mL | INTRAVENOUS | Status: DC
  Administered 2022-02-23 – 2022-02-24 (×3): 250 mL via INTRAVENOUS

## 2022-02-23 NOTE — Plan of Care (Addendum)
Called POA nephew [name] twice today and left message for calling back to discuss about GOA.   Rosalin Hawking, MD PhD Stroke Neurology 02/23/2022 2:49 PM

## 2022-02-23 NOTE — Progress Notes (Signed)
Spoke with pt's nephew about pt's status and he stated ihe was ok with starting comfort care for Shannon Chen.

## 2022-02-23 NOTE — Progress Notes (Signed)
STROKE TEAM PROGRESS NOTE   INTERVAL HISTORY No family at bedside.  Patient lying in bed, not in acute distress but still has copious secretions not able to cough it out.  Intermittent SOB per RN.  Spontaneous moving right arm but not other extremities.  Fever overnight, Tmax 102.9, on Rocephin.   Vitals:   02/23/22 0408 02/23/22 0743 02/23/22 0955 02/23/22 1128  BP: (!) 162/44 (!) 154/41  (!) 170/42  Pulse: (!) 103 (!) 102  (!) 106  Resp: 15 16  16   Temp: (!) 102.9 F (39.4 C) 99.6 F (37.6 C)  99.1 F (37.3 C)  TempSrc: Axillary Oral    SpO2: 99% 99% 98% 100%  Weight: 63.3 kg      CBC:  Recent Labs  Lab 02/18/22 1940 02/18/22 2002 02/19/22 0612 02/20/22 0326 02/22/22 1043 02/23/22 0247  WBC 9.7   < > 8.4   < > 8.5 8.8  NEUTROABS 7.8*  --  6.9  --   --   --   HGB 11.3*   < > 9.2*   < > 9.3* 10.7*  HCT 36.4   < > 29.9*   < > 30.8* 36.0  MCV 90.8   < > 88.2   < > 90.1 90.5  PLT 74*   < > 101*   < > 136* 143*   < > = values in this interval not displayed.   Basic Metabolic Panel:  Recent Labs  Lab 02/21/22 0654 02/22/22 0900 02/22/22 0901 02/23/22 0247  NA 145  --  146* 149*  K 3.8  --  4.1 4.1  CL 120*  --  118* 114*  CO2 18*  --  15* 22  GLUCOSE 185*  --  152* 186*  BUN 23  --  19 23  CREATININE 0.65  --  0.64 0.88  CALCIUM 8.0*  --  8.4* 8.7*  MG 1.9 2.1  --   --   PHOS 3.0 3.4  --   --    Lipid Panel:  Recent Labs  Lab 02/19/22 0420 02/20/22 0326  CHOL 70  --   TRIG 134 140  HDL 12*  --   CHOLHDL 5.8  --   VLDL 27  --   LDLCALC 31  --    HgbA1c:  Recent Labs  Lab 02/19/22 0420  HGBA1C 7.1*   Urine Drug Screen: No results for input(s): "LABOPIA", "COCAINSCRNUR", "LABBENZ", "AMPHETMU", "THCU", "LABBARB" in the last 168 hours.  Alcohol Level  Recent Labs  Lab 02/18/22 2351  ETH <10    IMAGING past 24 hours No results found.  PHYSICAL EXAM General: no acute distress, drowsy sleepy Respiratory: labored breathing from time to time with  deep secretions  Neuro: drowsy sleepy, eyes open briefly with voice, nonverbal and not following commands. Right gaze preference and not cross midline, not blinking to visual threat on the left but able to blink on the right. Left facial droop. Tongue protrusion not cooperative. RUE spontaneous movement against gravity. LUE flaccid. BLEs 2-/5 withdraw to pain. Sensation, coordination and gait not tested.    ASSESSMENT/PLAN Ms. KRISTIN BARCUS is a 77 y.o. 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  TNK was not offered due to low platelet count, and mechanical thrombectomy was performed.  TICI2B  flow was achieved in the right MCA, however procedure was complicated by vessel rupture requiring vessel sacrifice with coil embolization.  Patient remained intubated after procedure and was transferred to the ICU.  Pt now extubated on room air but likely aspirating, requiring regular suctioning.  Also UTI(+) with Rocephin.  Will have pacemaker interrogated to look for possible afib as cause for stroke.  Transferring out of ICU.  Will monitor for improvement and transfer to SNF.  If condition begins to worsen we can discuss Comfort Care as pt had a one way extubation and is now DNR.  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  Multiple old infarcts.   CTA head & neck occlusion of right M2 superior division branch, 60% stenosis of left ICA, severe stenosis of right V2 segment, moderate stenosis of basilar artery MRI large acute right  MCA territory infarct, as well as numerous smaller infarcts in bilateral cerebral and cerebellar hemispheres, subarachnoid hemorrhage at right sylvian fissure, numerous chronic microhemorrhages suspicious for CAA Repeat CT scan 02/21/22 showed evolving right MCA infarct with small hyperdensity in the sylvian fissure. 2D Echo EF 70 to 75%, normal left atrial size, no atrial level shunt Pacemaker interrogation pending LDL 31 HgbA1c 6.4 VTE prophylaxis -Lovenox aspirin 81 mg daily and clopidogrel 75 mg daily prior to admission, now on ASA 325 Therapy recommendations: SNF Disposition: Pending, discussed with POA nephew Zollie Beckers, no escalation of care, if further decline, will consider comfort care   Respiratory failure Fever  Remain intubated post IR One way extubation 02/21/22, on [place] Copious secretion with intermittent SOB Tmax 100.5->102.9 On rocephin   UTI Fever 100.5->102.9 UA WBC > 50 On rocephin Urine culture pending  Carotid stenosis S/p right CEA in the past CTA head and neck - Left ICA 60% On ASA now  Hypertension Home meds: Norvasc 2.5 mg daily Stable On norvasc 2.5mg  and metoprolol 25mg  bid Long-term BP goal normotensive  Hyperlipidemia Home meds: Rosuvastatin 40 mg daily, resumed in hospital LDL 31, goal < 70 Continue statin at discharge  Dysphagia  NPO NG tube in place On TF@ 50  Other Stroke Risk Factors Advanced Age >/= 64  Former cigarette smoker Hx stroke Coronary artery disease 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  Other Active Problems Thrombocytopenia platelet 86->101->143 Hypernatremia Na 149 -> will increase IVF @ 50 AVB on pacer Kidney mass  Hospital day # 5    74, MD PhD Stroke Neurology 02/23/2022 2:18 PM     To contact Stroke Continuity provider, please refer to 04/24/2022. After hours, contact General Neurology.If 7pm- 7am, please page neurology on call as listed in AMION.

## 2022-02-24 ENCOUNTER — Inpatient Hospital Stay (HOSPITAL_COMMUNITY): Payer: Medicare HMO

## 2022-02-24 DIAGNOSIS — A419 Sepsis, unspecified organism: Secondary | ICD-10-CM

## 2022-02-24 DIAGNOSIS — I609 Nontraumatic subarachnoid hemorrhage, unspecified: Secondary | ICD-10-CM

## 2022-02-24 DIAGNOSIS — I63511 Cerebral infarction due to unspecified occlusion or stenosis of right middle cerebral artery: Secondary | ICD-10-CM | POA: Diagnosis not present

## 2022-02-24 LAB — CBC
HCT: 32.7 % — ABNORMAL LOW (ref 36.0–46.0)
HCT: 26.9 % — ABNORMAL LOW (ref 36.0–46.0)
Hemoglobin: 9.5 g/dL — ABNORMAL LOW (ref 12.0–15.0)
Hemoglobin: 8 g/dL — ABNORMAL LOW (ref 12.0–15.0)
MCH: 26.9 pg (ref 26.0–34.0)
MCH: 27.2 pg (ref 26.0–34.0)
MCHC: 29.1 g/dL — ABNORMAL LOW (ref 30.0–36.0)
MCHC: 29.7 g/dL — ABNORMAL LOW (ref 30.0–36.0)
MCV: 92.6 fL (ref 80.0–100.0)
MCV: 91.5 fL (ref 80.0–100.0)
Platelets: 188 10*3/uL (ref 150–400)
Platelets: 183 10*3/uL (ref 150–400)
RBC: 3.53 MIL/uL — ABNORMAL LOW (ref 3.87–5.11)
RBC: 2.94 MIL/uL — ABNORMAL LOW (ref 3.87–5.11)
RDW: 15.6 % — ABNORMAL HIGH (ref 11.5–15.5)
RDW: 15.6 % — ABNORMAL HIGH (ref 11.5–15.5)
WBC: 10.7 10*3/uL — ABNORMAL HIGH (ref 4.0–10.5)
WBC: 8.7 10*3/uL (ref 4.0–10.5)
nRBC: 0 % (ref 0.0–0.2)
nRBC: 0 % (ref 0.0–0.2)

## 2022-02-24 LAB — BASIC METABOLIC PANEL
Anion gap: 10 (ref 5–15)
Anion gap: 8 (ref 5–15)
BUN: 28 mg/dL — ABNORMAL HIGH (ref 8–23)
BUN: 34 mg/dL — ABNORMAL HIGH (ref 8–23)
CO2: 22 mmol/L (ref 22–32)
CO2: 23 mmol/L (ref 22–32)
Calcium: 8.2 mg/dL — ABNORMAL LOW (ref 8.9–10.3)
Calcium: 8 mg/dL — ABNORMAL LOW (ref 8.9–10.3)
Chloride: 117 mmol/L — ABNORMAL HIGH (ref 98–111)
Chloride: 118 mmol/L — ABNORMAL HIGH (ref 98–111)
Creatinine, Ser: 0.9 mg/dL (ref 0.44–1.00)
Creatinine, Ser: 0.89 mg/dL (ref 0.44–1.00)
GFR, Estimated: >60 mL/min (ref >60)
GFR, Estimated: >60 mL/min (ref >60)
Glucose, Bld: 165 mg/dL — ABNORMAL HIGH (ref 70–99)
Glucose, Bld: 244 mg/dL — ABNORMAL HIGH (ref 70–99)
Potassium: 4.1 mmol/L (ref 3.5–5.1)
Potassium: 4.1 mmol/L (ref 3.5–5.1)
Sodium: 149 mmol/L — ABNORMAL HIGH (ref 135–145)
Sodium: 149 mmol/L — ABNORMAL HIGH (ref 135–145)

## 2022-02-24 LAB — GLUCOSE, CAPILLARY
Glucose-Capillary: 151 mg/dL — ABNORMAL HIGH (ref 70–99)
Glucose-Capillary: 175 mg/dL — ABNORMAL HIGH (ref 70–99)
Glucose-Capillary: 176 mg/dL — ABNORMAL HIGH (ref 70–99)
Glucose-Capillary: 184 mg/dL — ABNORMAL HIGH (ref 70–99)
Glucose-Capillary: 186 mg/dL — ABNORMAL HIGH (ref 70–99)
Glucose-Capillary: 205 mg/dL — ABNORMAL HIGH (ref 70–99)
Glucose-Capillary: 210 mg/dL — ABNORMAL HIGH (ref 70–99)

## 2022-02-24 LAB — BLOOD GAS, ARTERIAL
Acid-Base Excess: 0.1 mmol/L (ref 0.0–2.0)
Bicarbonate: 23.6 mmol/L (ref 20.0–28.0)
Drawn by: 270271
O2 Saturation: 97.5 %
Patient temperature: 36
pCO2 arterial: 33 mmHg (ref 32–48)
pH, Arterial: 7.47 — ABNORMAL HIGH (ref 7.35–7.45)
pO2, Arterial: 68 mmHg — ABNORMAL LOW (ref 83–108)

## 2022-02-24 LAB — LACTIC ACID, PLASMA: Lactic Acid, Venous: 1.5 mmol/L (ref 0.5–1.9)

## 2022-02-24 LAB — C-REACTIVE PROTEIN: CRP: 4.9 mg/dL — ABNORMAL HIGH (ref <1.0)

## 2022-02-24 LAB — PROCALCITONIN: Procalcitonin: 0.13 ng/mL

## 2022-02-24 MED ORDER — VANCOMYCIN HCL 1250 MG/250ML IV SOLN
1250.0000 mg | Freq: Once | INTRAVENOUS | Status: AC
  Administered 2022-02-25: 1250 mg via INTRAVENOUS
  Filled 2022-02-24: qty 250

## 2022-02-24 MED ORDER — METRONIDAZOLE 500 MG/100ML IV SOLN
500.0000 mg | Freq: Two times a day (BID) | INTRAVENOUS | Status: DC
  Administered 2022-02-25 (×2): 500 mg via INTRAVENOUS
  Filled 2022-02-24 (×2): qty 100

## 2022-02-24 NOTE — Care Management Important Message (Signed)
Important Message  Patient Details  Name: Shannon Chen MRN: [id number] Date of Birth: 01-11-1946   Medicare Important Message Given:  Yes     [name] Montine Circle 02/24/2022, 3:16 PM

## 2022-02-24 NOTE — Plan of Care (Signed)
  Problem: Nutrition: Goal: Dietary intake will improve Outcome: Progressing   Problem: Fluid Volume: Goal: Ability to maintain a balanced intake and output will improve Outcome: Progressing   Problem: Education: Goal: Knowledge of disease or condition will improve Outcome: Not Progressing   Problem: Health Behavior/Discharge Planning: Goal: Ability to manage health-related needs will improve Outcome: Not Progressing   Problem: Self-Care: Goal: Ability to participate in self-care as condition permits will improve Outcome: Not Progressing   Problem: Nutrition: Goal: Risk of aspiration will decrease Outcome: Not Progressing   Problem: Health Behavior/Discharge Planning: Goal: Ability to manage health-related needs will improve Outcome: Not Progressing

## 2022-02-24 NOTE — Consult Note (Addendum)
Consult Note    RAYSHELL GOECKE URK:[id number] DOB: 1946/01/25 DOA: 02/18/2022  PCP: Remi Haggard, FNP  I have personally briefly reviewed patient's old medical records in [place]  Chief Complaint: red MEWS, concern for Sepsis  HPI: Shannon Chen is a 77 y.o. female with medical history significant of  CAD, heart block status post PPM, hypertension, hyperlipidemia, right carotid endarterectomy, kidney mass, peripheral arterial disease with penetrating atherosclerotic ulcer of aorta and common iliac artery aneurysm presents on 1/2 with suddent on set left sided weakness, aphasia and right gaze deviation who on evaluation was found to have acute R MCA stroke with R MCA M2 occulusion for which patient under went thrombectomy by neuro IR complicated by intracranial M3 branch rupture treated with coil embolization. In addition  there was also occlusion of the supraclinoid right ICA which was treated with one pass direct contact aspiration performed achieving complete recanalization of the ICA . Patient  s/p procedure intubated due to complicating SAH and depressed mental status. Patient course complicated by s/p one way extubation by persistent fever thought to be related to UTI vs Aspiration as patient has copious amount of respiratory symptoms with concern for aspiration pna.  Patient today noted to have increase respiratory rate in setting of elevated temp and continued respiratory secretions. Of note patient is a DNR and due to poor progression per noted Comfort care is being considered by family.  However, current Goals of care discussion has not occurred as of yet.  Due to continue fever and concern for sepsis internal medicine is asked to assist with patient care.  Vitals: temp 100.7 (102.6), BP 139/44, rr 28  sat 98% on RA  Labs Wbc 8.7, hgb 8-9.5,-10.7 RDW 15.6 Cxr:1. No acute cardiopulmonary disease. No change from the most recent prior study. Jan 5th, UA+:wbc>50 , rbc>50, few  bacteria  Tx ctx Review of Systems: As per HPI otherwise 10 point review of systems negative.   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 09/2016 s/p MDT [id number] Azure XT DR MRI DC PPM   CAD (coronary artery disease)    a. 11/2020 NSTEMI/Cath: LM mild diff dzs, LAD, diff dzs, LCX 30d, RCA Ca2+ w/ diff dzs throughout, 43m. FFR cath not able to advance-->Med Rx.   Carotid artery disease ([place])    a. 09/2016 Carotid U/S: RICA 61-60%, LICA 73-71%;  b. 0/6269 CTA Neck: RICA 60, RCCA 30, LICA 48%, L Vert 60, L Basilar 70; c. 10/2019 s/p R CEA.   Essential hypertension    History of stress test    a. 12/2016 MV: EF 51%, no ischemia/infarct; b. 10/2019 MV: EF 61%, no ischemia/infarct.   Hyperlipidemia    Hypertrophic cardiomyopathy ([place])    a. 09/2016 Echo: EF 60-65%, no rwma, Gr1 DD, Ca2+ MV annulus; b. 11/2017 Echo: EF 70-75%, no rwma, Gr1 DD, mild AS vs dynamic LVOT obs. Nl RV fxn. Hyperdynamic LVEF w/ sev LVH; c. 10/2019 Echo: EF 70-75%, no rwma, gr1 DD, sev LVH, Gr1 DD, nl PASP, triv MR, mild-mod Ao sclerosis; c. 05/2020 Echo: EF 60-65%; e. 11/2020 Echo: EF 60-65%, sev LVH, nl RV fxn, Mild MR. Ao Sclerosis.   Presence of permanent cardiac pacemaker     Past Surgical History:  Procedure Laterality Date   ABDOMINAL HYSTERECTOMY     CORONARY STENT INTERVENTION N/A 12/31/2020   Procedure: CORONARY STENT INTERVENTION;  Surgeon: Wellington Hampshire, MD;  Location: [facility] INVASIVE CV LAB;  Service: Cardiovascular;  Laterality: N/A;   ENDARTERECTOMY Right 11/16/2019   Procedure: ENDARTERECTOMY CAROTID;  Surgeon: Renford Dills, MD;  Location: [facility] ORS;  Service: Vascular;  Laterality: Right;   ENDOVASCULAR REPAIR/STENT GRAFT N/A 12/18/2021   Procedure: ENDOVASCULAR REPAIR/STENT GRAFT;  Surgeon: Renford Dills, MD;  Location: [facility] INVASIVE CV LAB;  Service: Cardiovascular;  Laterality: N/A;   INTRAVASCULAR PRESSURE WIRE/FFR STUDY N/A 12/13/2020   Procedure:  INTRAVASCULAR PRESSURE WIRE/FFR STUDY;  Surgeon: Marykay Lex, MD;  Location: [facility] CV [facility];  Service: Cardiovascular;  Laterality: N/A;   IR ANGIOGRAM FOLLOW UP STUDY  02/18/2022   IR ANGIOGRAM FOLLOW UP STUDY  02/18/2022   IR ANGIOGRAM FOLLOW UP STUDY  02/18/2022   IR ANGIOGRAM FOLLOW UP STUDY  02/18/2022   IR CT HEAD LTD  02/21/2022   IR PERCUTANEOUS ART THROMBECTOMY/INFUSION INTRACRANIAL INC DIAG ANGIO  02/18/2022   IR US GUIDE VASC ACCESS RIGHT  02/18/2022   LEFT HEART CATH AND CORONARY ANGIOGRAPHY N/A 12/13/2020   Procedure: LEFT HEART CATH AND CORONARY ANGIOGRAPHY;  Surgeon: Marykay Lex, MD;  Location: [facility] CV [facility];  Service: Cardiovascular;  Laterality: N/A;   LEFT HEART CATH AND CORONARY ANGIOGRAPHY N/A 12/31/2020   Procedure: LEFT HEART CATH AND CORONARY ANGIOGRAPHY;  Surgeon: Iran Ouch, MD;  Location: [facility];  Service: Cardiovascular;  Laterality: N/A;   MUSCLE BIOPSY Left 12/16/2021   Procedure: MUSCLE BIOPSY;  Surgeon: Campbell Lerner, MD;  Location: [facility] ORS;  Service: General;  Laterality: Left;   PACEMAKER IMPLANT N/A 10/08/2016   Procedure: Pacemaker Implant;  Surgeon: Marinus Maw, MD;  Location: [facility];  Service: Cardiovascular;  Laterality: N/A;   RADIOLOGY WITH ANESTHESIA N/A 02/18/2022   Procedure: IR WITH ANESTHESIA;  Surgeon: Julieanne Cotton, MD;  Location: [facility] OR;  Service: Radiology;  Laterality: N/A;     reports that she quit smoking about 3 years ago. Her smoking use included cigarettes. She has a 30.00 pack-year smoking history. She has been exposed to tobacco smoke. She has never used smokeless tobacco. She reports that she does not drink alcohol and does not use drugs.  No Known Allergies  Family History  Problem Relation Age of Onset   Diabetes Mother    CAD Father     Prior to Admission medications   Medication Sig Start Date End Date Taking? Authorizing Provider  acetaminophen (TYLENOL) 325 MG tablet Take  650 mg by mouth every 4 (four) hours as needed for mild pain or moderate pain.   Yes [provider]  albuterol (VENTOLIN HFA) 108 (90 Base) MCG/ACT inhaler Inhale 1-2 puffs into the lungs every 6 (six) hours as needed for wheezing or shortness of breath. 11/13/20  Yes [provider]  alum & mag hydroxide-simeth (MAALOX/MYLANTA) 200-200-20 MG/5ML suspension Take 15 mLs by mouth every 6 (six) hours as needed for flatulence. 12/23/21  Yes Enedina Finner, MD  Amino Acids-Protein Hydrolys (FEEDING SUPPLEMENT, PRO-STAT 64,) LIQD Take 30 mLs by mouth in the morning and at bedtime.   Yes [provider]  amLODipine (NORVASC) 2.5 MG tablet Take 2.5 mg by mouth daily. 01/08/21  Yes Antonieta Iba, MD  aspirin EC 81 MG EC tablet Take 1 tablet (81 mg total) by mouth daily. Swallow whole. 11/09/19  Yes Wouk, Wilfred Curtis, MD  calcium carbonate (OS-CAL - DOSED IN MG OF ELEMENTAL CALCIUM) 1250 (500 Ca) MG tablet Take 1 tablet (1,250 mg total)  by mouth 2 (two) times daily with a meal. 12/23/21  Yes Enedina Finner, MD  clopidogrel (PLAVIX) 75 MG tablet Take 75 mg by mouth daily. 03/29/21  Yes [provider]  cyanocobalamin 1000 MCG tablet Take 1 tablet (1,000 mcg total) by mouth daily. 12/24/21  Yes Enedina Finner, MD  dapagliflozin propanediol (FARXIGA) 10 MG TABS tablet Take 10 mg by mouth daily.   Yes [provider]  folic acid (FOLVITE) 1 MG tablet Take 1 tablet (1 mg total) by mouth daily. 12/24/21  Yes Enedina Finner, MD  KERENDIA 20 MG TABS Take 20 mg by mouth daily. 12/04/21  Yes [provider]  Menthol, Topical Analgesic, (BIOFREEZE) 4 % GEL Apply 1 Application topically in the morning, at noon, and at bedtime.   Yes [provider]  metoprolol succinate (TOPROL-XL) 50 MG 24 hr tablet Take 1 tablet (50 mg total) by mouth daily. 12/16/20 02/19/22 Yes Gillis Santa, MD  mirtazapine (REMERON) 7.5 MG tablet Take 7.5 mg by mouth daily. 12/04/20  Yes [provider]  Multiple Vitamin (MULTIVITAMIN WITH MINERALS) TABS tablet Take 1 tablet by mouth daily. 12/24/21  Yes Enedina Finner, MD  oxyCODONE-acetaminophen (PERCOCET/ROXICET) 5-325 MG tablet Take 1 tablet by mouth every 6 (six) hours as needed for moderate pain. 12/23/21  Yes Enedina Finner, MD  pantoprazole (PROTONIX) 40 MG tablet Take 1 tablet (40 mg total) by mouth daily. 12/24/21  Yes Enedina Finner, MD  polyethylene glycol (MIRALAX) 17 g packet Take 17 g by mouth daily as needed for mild constipation. 11/28/19  Yes Alver Sorrow, NP  potassium chloride (KLOR-CON) 10 MEQ tablet Take 10 mEq by mouth daily. 03/29/21  Yes [provider]  predniSONE (DELTASONE) 20 MG tablet Take 3 tablets (60 mg total) by mouth daily with breakfast. Take it till you are seen by Rheumatology Patient taking differently: Take 10 mg by mouth daily with breakfast. Take 0.5 tablet everyday for 14 days. 12/24/21  Yes Enedina Finner, MD  rosuvastatin (CRESTOR) 40 MG tablet TAKE 1 TABLET BY MOUTH DAILY 12/04/20  Yes Antonieta Iba, MD  TRELEGY ELLIPTA 200-62.5-25 MCG/ACT AEPB Inhale 1 puff into the lungs daily. 12/07/20  Yes [provider]  Vibegron (GEMTESA) 75 MG TABS Take 75 mg by mouth daily.   Yes [provider]  Vitamin D, Ergocalciferol, (DRISDOL) 1.25 MG (50000 UNIT) CAPS capsule Take 1 capsule (50,000 Units total) by mouth every 7 (seven) days. 12/23/21  Yes Enedina Finner, MD  cephALEXin (KEFLEX) 500 MG capsule Take 500 mg by mouth 2 (two) times daily. Patient not taking: Reported on 02/19/2022 01/15/22   [provider]  feeding supplement (ENSURE ENLIVE / ENSURE PLUS) LIQD Take 237 mLs by mouth 3 (three) times daily between meals. Patient not taking: Reported on 02/19/2022 12/23/21   Enedina Finner, MD    Physical Exam: Vitals:   02/24/22 2036 02/24/22 2100 02/24/22 2120 02/24/22 2122  BP: (!) 139/44 (!) 142/42    Pulse:      Resp: (!) 28 (!) 30    Temp: (!) 100.7 F (38.2 C)      TempSrc: Axillary     SpO2: 97% 98% 97% 97%  Weight:        Constitutional: NAD, calm, comfortable Vitals:   02/24/22 2036 02/24/22 2100 02/24/22 2120 02/24/22 2122  BP: (!) 139/44 (!) 142/42    Pulse:      Resp: (!) 28 (!) 30    Temp: (!) 100.7 F (38.2 C)  TempSrc: Axillary     SpO2: 97% 98% 97% 97%  Weight:       Eyes: PERRL, lids and conjunctivae normal ENMT: Mucous membranes are moist. Posterior pharynx clear of any exudate or lesions.Normal dentition.  Neck: normal, supple, no masses, no thyromegaly Respiratory: clear to auscultation bilaterally, no wheezing, no crackles. Normal respiratory effort. No accessory muscle use.  Cardiovascular: Regular rate and rhythm, no murmurs / rubs / gallops. No extremity edema. 2+ pedal pulses. No carotid bruits.  Abdomen: no tenderness, no masses palpated. No hepatosplenomegaly. Bowel sounds positive.  Musculoskeletal: no clubbing / cyanosis. No joint deformity upper and lower extremities. Good ROM, no contractures. Normal muscle tone.  Skin: no rashes, lesions, ulcers. No induration Neurologic: CN 2-12 grossly intact. Sensation intact, DTR normal. Strength 5/5 in all 4.  Psychiatric: Normal judgment and insight. Alert and oriented x 3. Normal mood.    Labs on Admission: I have personally reviewed following labs and imaging studies  CBC: Recent Labs  Lab 02/18/22 1940 02/18/22 2002 02/19/22 0612 02/20/22 0326 02/21/22 0654 02/22/22 1043 02/23/22 0247 02/24/22 0411  WBC 9.7   < > 8.4 9.6 7.5 8.5 8.8 10.7*  NEUTROABS 7.8*  --  6.9  --   --   --   --   --   HGB 11.3*   < > 9.2* 9.9* 8.8* 9.3* 10.7* 9.5*  HCT 36.4   < > 29.9* 30.8* 28.1* 30.8* 36.0 32.7*  MCV 90.8   < > 88.2 89.3 89.2 90.1 90.5 92.6  PLT 74*   < > 101* 122* 101* 136* 143* 188   < > = values in this interval not displayed.   Basic Metabolic Panel: Recent Labs  Lab 02/19/22 1627 02/20/22 0326 02/20/22 1621 02/21/22 0654 02/22/22 0900 02/22/22 0901  02/23/22 0247 02/24/22 0411  NA  --  143  --  145  --  146* 149* 149*  K  --  3.7  --  3.8  --  4.1 4.1 4.1  CL  --  117*  --  120*  --  118* 114* 117*  CO2  --  17*  --  18*  --  15* 22 22  GLUCOSE  --  158*  --  185*  --  152* 186* 165*  BUN  --  31*  --  23  --  19 23 28*  CREATININE  --  0.92  --  0.65  --  0.64 0.88 0.90  CALCIUM  --  7.8*  --  8.0*  --  8.4* 8.7* 8.2*  MG 1.9 1.9 2.0 1.9 2.1  --   --   --   PHOS 3.2 3.3 2.8 3.0 3.4  --   --   --    GFR: Estimated Creatinine Clearance: 47.3 mL/min (by C-G formula based on SCr of 0.9 mg/dL). Liver Function Tests: Recent Labs  Lab 02/18/22 2353  AST 19  ALT 20  ALKPHOS 118  BILITOT 1.0  PROT 5.3*  ALBUMIN 1.9*   No results for input(s): "LIPASE", "AMYLASE" in the last 168 hours. No results for input(s): "AMMONIA" in the last 168 hours. Coagulation Profile: Recent Labs  Lab 02/18/22 2355  INR 1.3*   Cardiac Enzymes: No results for input(s): "CKTOTAL", "CKMB", "CKMBINDEX", "TROPONINI" in the last 168 hours. BNP (last 3 results) No results for input(s): "PROBNP" in the last 8760 hours. HbA1C: No results for input(s): "HGBA1C" in the last 72 hours. CBG: Recent Labs  Lab 02/24/22 0426 02/24/22  6073 02/24/22 1145 02/24/22 1629 02/24/22 2034  GLUCAP 151* 176* 184* 205* 186*   Lipid Profile: No results for input(s): "CHOL", "HDL", "LDLCALC", "TRIG", "CHOLHDL", "LDLDIRECT" in the last 72 hours. Thyroid Function Tests: No results for input(s): "TSH", "T4TOTAL", "FREET4", "T3FREE", "THYROIDAB" in the last 72 hours. Anemia Panel: No results for input(s): "VITAMINB12", "FOLATE", "FERRITIN", "TIBC", "IRON", "RETICCTPCT" in the last 72 hours. Urine analysis:    Component Value Date/Time   COLORURINE AMBER (A) 02/21/2022 2300   APPEARANCEUR CLOUDY (A) 02/21/2022 2300   LABSPEC 1.025 02/21/2022 2300   PHURINE 5.0 02/21/2022 2300   GLUCOSEU 50 (A) 02/21/2022 2300   HGBUR LARGE (A) 02/21/2022 2300   BILIRUBINUR  NEGATIVE 02/21/2022 2300   KETONESUR NEGATIVE 02/21/2022 2300   PROTEINUR 100 (A) 02/21/2022 2300   NITRITE NEGATIVE 02/21/2022 2300   LEUKOCYTESUR MODERATE (A) 02/21/2022 2300    Radiological Exams on Admission: DG CHEST PORT 1 VIEW  Result Date: 02/24/2022 CLINICAL DATA:  Tachypnea. EXAM: PORTABLE CHEST 1 VIEW COMPARISON:  02/22/2022 and older studies. FINDINGS: Cardiac silhouette mildly enlarged.  No mediastinal or hilar masses. Clear lungs. Nasal/orogastric tube is stable, tip in the distal stomach. Stable left anterior chest wall dual lead pacemaker. IMPRESSION: 1. No acute cardiopulmonary disease. No change from the most recent prior study. Electronically Signed   By: Amie Portland M.D.   On: 02/24/2022 21:17    EKG: Independently reviewed.   Assessment/Plan    Acute R MCA CVA complicated by vessel rupture and SAH -no meaningful improvement  nonverbal and not following commands.  -per most recent neuro exam : Right gaze preference and not cross midline, not blinking to visual threat on the left but able to blink on the right. Left facial droop. Tongue protrusion not cooperative.  RUE spontaneous movement against gravity. LUE flaccid. BLEs 2-/5 withdraw to pain.  Sensation, coordination and gait not tested.   -poor prognosis  -currently plans for goals of care discussion with family  -per notes it appears family may be considering CMO  -will place palliative care consult to assist with gaols of care    Sepsis source presumed lung vs UTI -fever/tacycardia/tahycpnea ,+ infection -will broaden antibiotics vanc/ctx/metronidazole  -repeat urine culture as it was a poor sample  -repeat blood cultures  -check inflammatory marker, lactic acid -supportive care fever   Recurrent Aspiration due to copious respiratory secretions  -noted high fever that his persistent tmax 103 -despite being on abx  -patient also noted to be tachycardic as well as tachypneic  -cxr NAD -send complete  respiratory panel    UTI -fever since 1/5 -ctx started 1/6 -urine culture multiple species, recollection ordered  Hypoxemic respiratory failure s/p CVA treated with thrombectomy complicated by vessel rupture and SAH -one way extubated on 1/5    Hypertension -Goal blood pressure 100-140 -Continue Cleviprex   Type 2 diabetes -SSI   Chronic obstructive pulmonary disease -Bronchodilators   History of coronary artery disease History of hypertrophic cardiomyopathy -Monitor   AKI -Avoid nephrotoxic medications -Maintain renal perfusion -Trend electrolytes   Background history of obstructive lung disease -On Trelegy as outpatient -As needed albuterol -Mikael Spray and Brovana  DVT prophylaxis:scd Code Status: DNR Family Communication: none at bedside Disposition Plan: patient  expected to be admitted greater than 2 midnights  Consults called:neurology Admission status: inpatient    Lurline Del MD [facility]   If 7PM-7AM, please contact night-coverage [email or web address] Password [facility]  02/24/2022, 9:41 PM

## 2022-02-24 NOTE — Progress Notes (Addendum)
STROKE TEAM PROGRESS NOTE   INTERVAL HISTORY No family at bedside.  Patient lying in bed, not in acute distress but still has copious secretions not able to cough it out.  Snoring respirations this morning.  Spontaneous moving right arm but not other extremities.  Tmax 39.29F overnight.  Nephew is coming to the bedside, plan for GOC discussion in person today  Vitals:   02/23/22 2347 02/24/22 0344 02/24/22 0400 02/24/22 0723  BP: (!) 167/43 (!) 170/44  (!) 167/39  Pulse: (!) 102 (!) 108  (!) 110  Resp: (!) 24 20  (!) 24  Temp: (!) 103 F (39.4 C) (!) 102 F (38.9 C)  98.9 F (37.2 C)  TempSrc: Oral Oral  Oral  SpO2: 99% 99%  97%  Weight:   64 kg    CBC:  Recent Labs  Lab 02/18/22 1940 02/18/22 2002 02/19/22 0612 02/20/22 0326 02/23/22 0247 02/24/22 0411  WBC 9.7   < > 8.4   < > 8.8 10.7*  NEUTROABS 7.8*  --  6.9  --   --   --   HGB 11.3*   < > 9.2*   < > 10.7* 9.5*  HCT 36.4   < > 29.9*   < > 36.0 32.7*  MCV 90.8   < > 88.2   < > 90.5 92.6  PLT 74*   < > 101*   < > 143* 188   < > = values in this interval not displayed.   Basic Metabolic Panel:  Recent Labs  Lab 02/21/22 0654 02/22/22 0900 02/22/22 0901 02/23/22 0247 02/24/22 0411  NA 145  --    < > 149* 149*  K 3.8  --    < > 4.1 4.1  CL 120*  --    < > 114* 117*  CO2 18*  --    < > 22 22  GLUCOSE 185*  --    < > 186* 165*  BUN 23  --    < > 23 28*  CREATININE 0.65  --    < > 0.88 0.90  CALCIUM 8.0*  --    < > 8.7* 8.2*  MG 1.9 2.1  --   --   --   PHOS 3.0 3.4  --   --   --    < > = values in this interval not displayed.   Lipid Panel:  Recent Labs  Lab 02/19/22 0420 02/20/22 0326  CHOL 70  --   TRIG 134 140  HDL 12*  --   CHOLHDL 5.8  --   VLDL 27  --   LDLCALC 31  --    HgbA1c:  Recent Labs  Lab 02/19/22 0420  HGBA1C 7.1*   Urine Drug Screen: No results for input(s): "LABOPIA", "COCAINSCRNUR", "LABBENZ", "AMPHETMU", "THCU", "LABBARB" in the last 168 hours.  Alcohol Level  Recent Labs   Lab 02/18/22 2351  ETH <10    IMAGING past 24 hours No results found.  PHYSICAL EXAM General: no acute distress, drowsy sleepy Respiratory: labored breathing from time to time with deep secretions  Neuro: drowsy sleepy, eyes open briefly with voice, nonverbal and not following commands. Right gaze preference and not cross midline, not blinking to visual threat on the left but able to blink on the right. Left facial droop. Tongue protrusion not cooperative.  RUE spontaneous movement against gravity. LUE flaccid. BLEs 2-/5 withdraw to pain.  Sensation, coordination and gait not tested.    ASSESSMENT/PLAN Ms. Shannon Scarce  JENESE Chen is a 77 y.o. 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  TNK was not offered due to low platelet count, and mechanical thrombectomy was performed.  TICI2B flow was achieved in the right MCA, however procedure was complicated by vessel rupture requiring vessel sacrifice with coil embolization.  Patient remained intubated after procedure and was transferred to the ICU.  Pt now extubated on room air but likely aspirating, requiring regular suctioning.  Also UTI(+) with Rocephin.  Will have pacemaker interrogated to look for possible afib as cause for stroke.  Transferring out of ICU.  Will monitor for improvement and transfer to SNF.  If condition begins to worsen we can discuss Comfort Care as pt had a one way extubation and is now DNR.  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  Multiple old  infarcts.   CTA head & neck occlusion of right M2 superior division branch, 60% stenosis of left ICA, severe stenosis of right V2 segment, moderate stenosis of basilar artery MRI large acute right MCA territory infarct, as well as numerous smaller infarcts in bilateral cerebral and cerebellar hemispheres, subarachnoid hemorrhage at right sylvian fissure, numerous chronic microhemorrhages suspicious for CAA Repeat CT scan 02/21/22 showed evolving right MCA infarct with small hyperdensity in the sylvian fissure. 2D Echo EF 70 to 75%, normal left atrial size, no atrial level shunt Pacemaker interrogation  LDL 31 HgbA1c 6.4 VTE prophylaxis -Lovenox aspirin 81 mg daily and clopidogrel 75 mg daily prior to admission, now on ASA 325 Therapy recommendations: SNF Disposition: Discussed with POA nephew Zollie Beckers, no escalation of care, if further decline, will consider comfort care   Respiratory failure Aspiration pneumonia Fever  Leukocytosis  Remain intubated post IR One way extubation 02/21/22, on [place] Copious secretion with intermittent SOB Tmax 100.5->102.9->103 WBC 8.8->10.7 On rocephin   UTI Fever 103.4 UA WBC > 50 On rocephin  Carotid stenosis S/p right CEA in the past CTA head and neck - Left ICA 60% On ASA now  Hypertension Home meds: Norvasc 2.5 mg daily Stable On norvasc 2.5mg  and metoprolol 25mg  bid Long-term BP goal normotensive  Hyperlipidemia Home meds: Rosuvastatin 40 mg daily, resumed in hospital LDL 31, goal < 70 Continue statin at discharge  Dysphagia  NPO NG tube in place On TF@ 50  Other Stroke Risk Factors Advanced Age >/= 81  Former cigarette smoker Hx stroke Coronary artery disease 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  Other Active Problems Thrombocytopenia platelet 86->101->143 Hypernatremia Na 149 -> will increase IVF @ 50 AVB on pacer Kidney mass  Hospital day # 6    Patient seen and examined by NP/APP with MD. MD to update note  as needed.   76, DNP, FNP-BC [facility] Pager: [phone]   ATTENDING NOTE: I reviewed above note and agree with the assessment and plan.   Pt continues to be lethargic and somnolence. Continues to have fever, Tmax 103, on tylenol and ice packs. Continue to have secretions not able to suction out. On rocephin. Again discussed with Nephew POA [phone], he planned to come over  today for further [place] decisions.    For detailed assessment and plan, please refer to above/below as I have made changes wherever appropriate.   Rosalin Hawking, MD PhD Stroke Neurology 02/24/2022 4:50 PM     To contact Stroke Continuity provider, please refer to [email or web address] After hours, contact General Neurology.If 7pm- 7am, please page neurology on call as listed in [place].

## 2022-02-24 NOTE — Progress Notes (Signed)
Called by RN regarding red MEWS due to change in patient's vitals with temp 102.6, tachycardic 120s sinus rhythm, tachypneic with RR 30, BP 184/51, 6 beat run of SVT.   Sepsis workup initiated. Obtaining CXR, CBC, BMP and blood cultures. Hospitalist service is being consulted.   Electronically signed: Dr. Kerney Elbe

## 2022-02-25 ENCOUNTER — Ambulatory Visit: Payer: Medicare HMO | Admitting: Physician Assistant

## 2022-02-25 DIAGNOSIS — I63511 Cerebral infarction due to unspecified occlusion or stenosis of right middle cerebral artery: Secondary | ICD-10-CM | POA: Diagnosis not present

## 2022-02-25 LAB — RESPIRATORY PANEL BY PCR

## 2022-02-25 LAB — BLOOD CULTURE ID PANEL (REFLEXED) - BCID2

## 2022-02-25 LAB — URINALYSIS, ROUTINE W REFLEX MICROSCOPIC
Bilirubin Urine: NEGATIVE
Glucose, UA: NEGATIVE mg/dL
Ketones, ur: NEGATIVE mg/dL
Nitrite: NEGATIVE
Protein, ur: 100 mg/dL — AB
Specific Gravity, Urine: 1.027 (ref 1.005–1.030)
pH: 5 (ref 5.0–8.0)

## 2022-02-25 LAB — CBC
HCT: 28.9 % — ABNORMAL LOW (ref 36.0–46.0)
Hemoglobin: 8.5 g/dL — ABNORMAL LOW (ref 12.0–15.0)
MCH: 27.2 pg (ref 26.0–34.0)
MCHC: 29.4 g/dL — ABNORMAL LOW (ref 30.0–36.0)
MCV: 92.3 fL (ref 80.0–100.0)
Platelets: 214 10*3/uL (ref 150–400)
RBC: 3.13 MIL/uL — ABNORMAL LOW (ref 3.87–5.11)
RDW: 15.9 % — ABNORMAL HIGH (ref 11.5–15.5)
WBC: 11 10*3/uL — ABNORMAL HIGH (ref 4.0–10.5)
nRBC: 0 % (ref 0.0–0.2)

## 2022-02-25 LAB — BASIC METABOLIC PANEL
Anion gap: 11 (ref 5–15)
BUN: 35 mg/dL — ABNORMAL HIGH (ref 8–23)
CO2: 21 mmol/L — ABNORMAL LOW (ref 22–32)
Calcium: 8.1 mg/dL — ABNORMAL LOW (ref 8.9–10.3)
Chloride: 118 mmol/L — ABNORMAL HIGH (ref 98–111)
Creatinine, Ser: 0.87 mg/dL (ref 0.44–1.00)
GFR, Estimated: >60 mL/min (ref >60)
Glucose, Bld: 196 mg/dL — ABNORMAL HIGH (ref 70–99)
Potassium: 4.4 mmol/L (ref 3.5–5.1)
Sodium: 150 mmol/L — ABNORMAL HIGH (ref 135–145)

## 2022-02-25 LAB — LACTIC ACID, PLASMA: Lactic Acid, Venous: 1.4 mmol/L (ref 0.5–1.9)

## 2022-02-25 LAB — GLUCOSE, CAPILLARY
Glucose-Capillary: 194 mg/dL — ABNORMAL HIGH (ref 70–99)
Glucose-Capillary: 160 mg/dL — ABNORMAL HIGH (ref 70–99)
Glucose-Capillary: 196 mg/dL — ABNORMAL HIGH (ref 70–99)
Glucose-Capillary: 191 mg/dL — ABNORMAL HIGH (ref 70–99)

## 2022-02-25 MED ORDER — BIOTENE DRY MOUTH MT LIQD: 15.0000 mL | OROMUCOSAL | Status: DC | PRN

## 2022-02-25 MED ORDER — ACETAMINOPHEN 650 MG RE SUPP: 650.0000 mg | Freq: Four times a day (QID) | RECTAL | Status: DC | PRN

## 2022-02-25 MED ORDER — ONDANSETRON HCL 4 MG PO TABS: 4.0000 mg | ORAL_TABLET | Freq: Four times a day (QID) | ORAL | Status: DC | PRN

## 2022-02-25 MED ORDER — VANCOMYCIN HCL IN DEXTROSE 1-5 GM/200ML-% IV SOLN: 1000.0000 mg | INTRAVENOUS | Status: DC

## 2022-02-25 MED ORDER — LORAZEPAM 1 MG PO TABS: 2.0000 mg | ORAL_TABLET | ORAL | Status: DC | PRN

## 2022-02-25 MED ORDER — HALOPERIDOL LACTATE 5 MG/ML IJ SOLN: 0.5000 mg | INTRAMUSCULAR | Status: DC | PRN

## 2022-02-25 MED ORDER — LORAZEPAM 1 MG PO TABS: 1.0000 mg | ORAL_TABLET | ORAL | Status: DC | PRN

## 2022-02-25 MED ORDER — GLYCOPYRROLATE 0.2 MG/ML IJ SOLN
0.2000 mg | INTRAMUSCULAR | Status: DC | PRN
  Filled 2022-02-25: qty 1

## 2022-02-25 MED ORDER — HALOPERIDOL 0.5 MG PO TABS: 0.5000 mg | ORAL_TABLET | ORAL | Status: DC | PRN

## 2022-02-25 MED ORDER — ONDANSETRON HCL 4 MG/2ML IJ SOLN: 4.0000 mg | Freq: Four times a day (QID) | INTRAMUSCULAR | Status: DC | PRN

## 2022-02-25 MED ORDER — LORAZEPAM 2 MG/ML PO CONC: 1.0000 mg | ORAL | Status: DC | PRN

## 2022-02-25 MED ORDER — HALOPERIDOL LACTATE 2 MG/ML PO CONC: 0.5000 mg | ORAL | Status: DC | PRN

## 2022-02-25 MED ORDER — SCOPOLAMINE 1 MG/3DAYS TD PT72
1.0000 | MEDICATED_PATCH | TRANSDERMAL | Status: DC
  Administered 2022-02-25: 1.5 mg via TRANSDERMAL
  Filled 2022-02-25 (×2): qty 1

## 2022-02-25 MED ORDER — ACETAMINOPHEN 325 MG PO TABS: 650.0000 mg | ORAL_TABLET | Freq: Four times a day (QID) | ORAL | Status: DC | PRN

## 2022-02-25 MED ORDER — MORPHINE SULFATE (PF) 2 MG/ML IV SOLN
1.0000 mg | INTRAVENOUS | Status: DC | PRN
  Administered 2022-02-26: 1 mg via INTRAVENOUS
  Filled 2022-02-25: qty 1

## 2022-02-25 MED ORDER — ACETAMINOPHEN 160 MG/5ML PO SOLN: 325.0000 mg | ORAL | Status: DC | PRN

## 2022-02-25 MED ORDER — GLYCOPYRROLATE 0.2 MG/ML IJ SOLN
0.2000 mg | INTRAMUSCULAR | Status: DC | PRN
  Administered 2022-02-26 (×2): 0.2 mg via INTRAVENOUS
  Filled 2022-02-25: qty 1

## 2022-02-25 MED ORDER — ACETAMINOPHEN 10 MG/ML IV SOLN
1000.0000 mg | INTRAVENOUS | Status: AC | PRN
  Administered 2022-02-25 – 2022-02-26 (×2): 1000 mg via INTRAVENOUS
  Filled 2022-02-25 (×3): qty 100

## 2022-02-25 MED ORDER — POLYVINYL ALCOHOL 1.4 % OP SOLN: 1.0000 [drp] | Freq: Four times a day (QID) | OPHTHALMIC | Status: DC | PRN

## 2022-02-25 MED ORDER — GLYCOPYRROLATE 1 MG PO TABS: 1.0000 mg | ORAL_TABLET | ORAL | Status: DC | PRN

## 2022-02-25 NOTE — Plan of Care (Signed)
  Problem: Nutrition: Goal: Dietary intake will improve Outcome: Progressing   Problem: Fluid Volume: Goal: Ability to maintain a balanced intake and output will improve Outcome: Progressing   Problem: Education: Goal: Knowledge of disease or condition will improve Outcome: Not Progressing   Problem: Ischemic Stroke/TIA Tissue Perfusion: Goal: Complications of ischemic stroke/TIA will be minimized Outcome: Not Progressing   Problem: Health Behavior/Discharge Planning: Goal: Ability to manage health-related needs will improve Outcome: Not Progressing   Problem: Self-Care: Goal: Ability to communicate needs accurately will improve Outcome: Not Progressing   Problem: Nutrition: Goal: Risk of aspiration will decrease Outcome: Not Progressing

## 2022-02-25 NOTE — Progress Notes (Addendum)
STROKE TEAM PROGRESS NOTE   INTERVAL HISTORY No family at bedside. Spoke with Shannon Chen, in agreement to proceed with comfort care.  Febrile overnight, increased respiratory effort.   Vitals:   02/25/22 0301 02/25/22 0526 02/25/22 0644 02/25/22 0715  BP: (!) 155/59 (!) 179/48 (!) 157/63 (!) 155/44  Pulse:    91  Resp: (!) 30 (!) 28 (!) 28 (!) 28  Temp: (!) 101.4 F (38.6 C) (!) 101.1 F (38.4 C) 99.4 F (37.4 C) (!) 100.4 F (38 C)  TempSrc: Axillary Axillary Axillary Axillary  SpO2: 97% 97% 98% 98%  Weight:       CBC:  Recent Labs  Lab 02/18/22 1940 02/18/22 2002 02/19/22 0612 02/20/22 0326 02/24/22 2117 02/25/22 0429  WBC 9.7   < > 8.4   < > 8.7 11.0*  NEUTROABS 7.8*  --  6.9  --   --   --   HGB 11.3*   < > 9.2*   < > 8.0* 8.5*  HCT 36.4   < > 29.9*   < > 26.9* 28.9*  MCV 90.8   < > 88.2   < > 91.5 92.3  PLT 74*   < > 101*   < > 183 214   < > = values in this interval not displayed.    Basic Metabolic Panel:  Recent Labs  Lab 02/21/22 0654 02/22/22 0900 02/22/22 0901 02/24/22 2117 02/25/22 0429  NA 145  --    < > 149* 150*  K 3.8  --    < > 4.1 4.4  CL 120*  --    < > 118* 118*  CO2 18*  --    < > 23 21*  GLUCOSE 185*  --    < > 244* 196*  BUN 23  --    < > 34* 35*  CREATININE 0.65  --    < > 0.89 0.87  CALCIUM 8.0*  --    < > 8.0* 8.1*  MG 1.9 2.1  --   --   --   PHOS 3.0 3.4  --   --   --    < > = values in this interval not displayed.    Lipid Panel:  Recent Labs  Lab 02/19/22 0420 02/20/22 0326  CHOL 70  --   TRIG 134 140  HDL 12*  --   CHOLHDL 5.8  --   VLDL 27  --   LDLCALC 31  --     HgbA1c:  Recent Labs  Lab 02/19/22 0420  HGBA1C 7.1*    Urine Drug Screen: No results for input(s): "LABOPIA", "COCAINSCRNUR", "LABBENZ", "AMPHETMU", "THCU", "LABBARB" in the last 168 hours.  Alcohol Level  Recent Labs  Lab 02/18/22 2351  ETH <10     IMAGING past 24 hours DG CHEST PORT 1 VIEW  Result Date: 02/24/2022 CLINICAL DATA:   Tachypnea. EXAM: PORTABLE CHEST 1 VIEW COMPARISON:  02/22/2022 and older studies. FINDINGS: Cardiac silhouette mildly enlarged.  No mediastinal or hilar masses. Clear lungs. Nasal/orogastric tube is stable, tip in the distal stomach. Stable left anterior chest wall dual lead pacemaker. IMPRESSION: 1. No acute cardiopulmonary disease. No change from the most recent prior study. Electronically Signed   By: Lajean Manes M.D.   On: 02/24/2022 21:17    PHYSICAL EXAM General: no acute distress, drowsy sleepy Respiratory: labored breathing from time to time with deep secretions  Neuro: drowsy sleepy, eyes open briefly with voice, nonverbal and not following commands. Right  gaze preference and not cross midline, not blinking to visual threat on the left but able to blink on the right. Left facial droop. Tongue protrusion not cooperative.  RUE spontaneous movement against gravity. LUE flaccid. BLEs 2-/5 withdraw to pain.  Sensation, coordination and gait not tested.    ASSESSMENT/PLAN [name] is a 77 y.o. 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  TNK was not offered due to low platelet count, and mechanical thrombectomy was performed.  TICI2B flow was achieved in the right MCA, however procedure was complicated by vessel rupture requiring vessel sacrifice with coil embolization.  Patient remained intubated after procedure and was transferred to the ICU.  Pt now extubated on room air but likely aspirating, requiring regular suctioning.  Also UTI(+) with Rocephin.  Will have pacemaker interrogated to look for possible afib as cause for stroke.  Transferring out of ICU.  Will monitor for improvement and transfer to SNF.  If  condition begins to worsen we can discuss Comfort Care as pt had a one way extubation and is now DNR.  Will proceed with comfort care.   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  Multiple old infarcts.   CTA head & neck occlusion of right M2 superior division branch, 60% stenosis of left ICA, severe stenosis of right V2 segment, moderate stenosis of basilar artery MRI large acute right MCA territory infarct, as well as numerous smaller infarcts in bilateral cerebral and cerebellar hemispheres, subarachnoid hemorrhage at right sylvian fissure, numerous chronic microhemorrhages suspicious for CAA Repeat CT scan 02/21/22 showed evolving right MCA infarct with small hyperdensity in the sylvian fissure. 2D Echo EF 70 to 75%, normal left atrial size, no atrial level shunt Pacemaker interrogation  LDL 31 HgbA1c 6.4 VTE prophylaxis -Lovenox aspirin 81 mg daily and clopidogrel 75 mg daily prior to admission, was on ASA 325. Now d/c for comfort care Disposition: Discussed with POA nephew Shannon Chen, will proceed to comfort care given decline  Respiratory failure Aspiration pneumonia Fever  Leukocytosis  Remain intubated post IR One way extubation 02/21/22, on [place] Copious secretion with intermittent SOB Tmax 100.5->102.9->103->103.1 WBC 8.8->10.7 -> 11.0 Was on rocephin   UTI Fever 103.4 UA WBC > 50 Was treated with rocephin  Carotid stenosis S/p right CEA in the past CTA head and neck - Left ICA 60% Was on ASA, now in comfort care  Hypertension Home meds: Norvasc 2.5 mg daily Was on norvasc 2.5mg  and metoprolol 25mg  bid Now off BP meds for comfort care  Hyperlipidemia Home meds: Rosuvastatin 40 mg daily, resumed in hospital LDL 31, goal < 70  Dysphagia  NPO Was on TF Now d/c  NG tube for comfort   Other Stroke Risk Factors Advanced Age >/= 71  Former cigarette smoker Hx stroke Coronary artery disease 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  Other Active Problems Thrombocytopenia platelet 86->101->143 Hypernatremia Na 149 -> 150 AVB on pacer Kidney mass  Hospital day # 7    Patient seen and examined by NP/APP with MD. MD to update note as needed.   76  Pamalee Leyden, DNP, FNP-BC [facility] Pager: [phone]  ATTENDING NOTE: I reviewed above note and agree with the assessment and plan. Pt was seen and examined.   Pt overnight continue to have fever, and respiratory decline. Discussed again with POA Shannon Chen this am, family decided to proceed with palliative care.   For detailed assessment and plan, please refer to above/below as I have made changes wherever appropriate.   Marvel Plan, MD PhD Stroke Neurology 02/25/2022 9:47 PM     To contact Stroke Continuity provider, please refer to [email or web address]. After hours, contact General Neurology.If 7pm- 7am, please page neurology on call as listed in [place].

## 2022-02-25 NOTE — Progress Notes (Signed)
PHARMACY - PHYSICIAN COMMUNICATION CRITICAL VALUE ALERT - BLOOD CULTURE IDENTIFICATION (BCID)  Shannon Chen is an 77 y.o. female who presented to [facility] on 02/18/2022 with a chief complaint of sepsis  Assessment:  21 YOF recently noted to have an acute stroke/SAH with signs/symptoms of sepsis and now with 1 of 4 blood cultures growing GPC in chains and BCID detecting Enterococcus faecalis. Patient noted to also have a pacemaker however plans now made for comfort care.   Name of physician (or Provider) ContactedCharlean Merl (neurology)  Current antibiotics: None - comfort care  Changes to prescribed antibiotics recommended:  No antibiotics warranted with comfort care  No results found for this or any previous visit.  Thank you for allowing pharmacy to be a part of this patient's care.  Alycia Rossetti, PharmD, BCPS Infectious Diseases Clinical Pharmacist 02/25/2022 2:02 PM   **Pharmacist phone directory can now be found on [email or web address] ([email or web address]).  Listed under [place].

## 2022-02-25 NOTE — Progress Notes (Signed)
Physical Therapy Discharge  Patient is being discharged from PT services secondary to:   Medical decline- transitioning to comfort care and MD discontinued order.     Please see latest Therapy Progress Note for current level of functioning and progress toward goals.  Progress and discharge plan and discussed with patient/caregiver and they  Patient unable to participate in discharge [place], [place]  Office [phone]

## 2022-02-25 NOTE — Plan of Care (Signed)
  Plan of Care  Spoke with [name] and decision maker), he is ready to proceed with comfort care. RN (Amy) present for conversation and case management team updated. Orders updated.    Janine Ores, DNP, FNP-BC [facility] Pager: [phone]

## 2022-02-25 NOTE — Progress Notes (Signed)
Spoke to stroke team NP Janine Ores, patient has been made comfort care.  TRH will sign off.  Call us again if needed

## 2022-02-25 NOTE — Progress Notes (Signed)
Pharmacy Antibiotic Note  Shannon Chen is a 77 y.o. female admitted on 02/18/2022 , now spiking fever with tachycardia and tacypnea >> concern for sepsis.  Pharmacy has been consulted for vancomycin dosing.  Plan: Vancomycin 1250mg  IV x1 then 1000mg  IV Q24H. Goal AUC 400-550.  Expected AUC 490.  Weight: 64 kg (141 lb 1.5 oz)  Temp (24hrs), Avg:101 F (38.3 C), Min:98.9 F (37.2 C), Max:103.2 F (39.6 C)  Recent Labs  Lab 02/21/22 0654 02/22/22 0901 02/22/22 1043 02/23/22 0247 02/24/22 0411 02/24/22 2117 02/24/22 2326  WBC 7.5  --  8.5 8.8 10.7* 8.7  --   CREATININE 0.65 0.64  --  0.88 0.90 0.89  --   LATICACIDVEN  --   --   --   --   --  1.5 1.4    Estimated Creatinine Clearance: 47.9 mL/min (by C-G formula based on SCr of 0.89 mg/dL).    No Known Allergies   Thank you for allowing pharmacy to be a part of this patient's care.  Wynona Neat, PharmD, BCPS  02/25/2022 4:11 AM

## 2022-02-26 DIAGNOSIS — I63511 Cerebral infarction due to unspecified occlusion or stenosis of right middle cerebral artery: Secondary | ICD-10-CM | POA: Diagnosis not present

## 2022-02-26 DIAGNOSIS — E440 Moderate protein-calorie malnutrition: Secondary | ICD-10-CM | POA: Diagnosis not present

## 2022-02-26 DIAGNOSIS — Z515 Encounter for palliative care: Secondary | ICD-10-CM | POA: Diagnosis not present

## 2022-02-26 DIAGNOSIS — I639 Cerebral infarction, unspecified: Secondary | ICD-10-CM | POA: Diagnosis not present

## 2022-02-26 DIAGNOSIS — J9601 Acute respiratory failure with hypoxia: Secondary | ICD-10-CM

## 2022-02-26 DIAGNOSIS — R509 Fever, unspecified: Secondary | ICD-10-CM | POA: Diagnosis not present

## 2022-02-26 MED ORDER — MORPHINE 100MG IN NS 100ML (1MG/ML) PREMIX INFUSION
1.0000 mg/h | INTRAVENOUS | Status: DC
  Administered 2022-02-26: 1 mg/h via INTRAVENOUS
  Filled 2022-02-26: qty 100
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
## 2022-02-26 NOTE — Progress Notes (Signed)
Notified Dr. Cheral Marker of Pts. Fever 103.1.

## 2022-02-26 NOTE — Consult Note (Signed)
Palliative Care Consult Note                                  Date: 02/26/2022   Patient Name: Shannon Chen  DOB: Feb 03, 1946  MRN: [id number]  Age / Sex: 77 y.o., female  PCP: Shannon Haggard, FNP Referring Physician: Stroke, Md, MD  Reason for Consultation: Establishing goals of care  HPI/Patient Profile: 77 y.o. female  with past medical history of CAD, heart block with pacemaker, PAD, carotid artery disease, hypertension, and hyperlipidemia who was admitted to [facility] on 02/18/2022 with right MCA stroke.  He was not a candidate for TNK.  Mechanical thrombectomy was performed but procedure was complicated by vessel rupture.  Patient remained intubated after procedure and was transferred to ICU.  She was extubated on 1/5, but was unable to manage her secretions and required frequent suctioning.  Concern for aspiration.  Palliative Medicine was consulted for goals of care.  Past Medical History:  Diagnosis Date   Arthritis    AV block, Mobitz II    CAD (coronary artery disease)    Carotid artery disease ([name])    Essential hypertension    History of stress test    Hyperlipidemia    Hypertrophic cardiomyopathy ([name])    Presence of permanent cardiac pacemaker     Subjective:   I have reviewed medical records including progress notes, labs and imaging, and assessed the patient at bedside.  Patient appears comfortable.  She is on a morphine infusion at 2 mg/hour.  She is unresponsive to voice and light touch. No non-verbal signs of pain or discomfort noted. Respirations are even and unlabored. No excessive respiratory secretions noted.   No family present at bedside currently. I spoke with nephew/Shannon Chen (by phone) to discuss diagnosis, prognosis, GOC, EOL wishes, disposition, and options.  Shannon Chen shares that there is not a documented HCPOA, that he has been designated by the family as the point of contact.  I introduced Palliative  Medicine as specialized medical care for people living with serious illness. It focuses on providing relief from the symptoms and stress of a serious illness.   We discussed patient's current medical condition and what it means in the larger context of her ongoing co-morbidities. Current clinical status was reviewed.   I confirmed with Shannon Chen that the goal of care is comfort measures only.  Discussed that comfort care means allowing a natural course to occur rather than pursuing interventions to prolong life. It also means managing symptoms at end-of-life to allow for a dignified and peaceful passing.   Discussed the option to transfer to a residential hospice facility for a more peaceful setting at end-of-life.  Shannon Chen agrees and states preference for facility in [place].   Review of Systems  Unable to perform ROS   Objective:   Primary Diagnoses: Present on Admission:  Acute right MCA stroke ([place])  Malnutrition of moderate degree   Physical Exam Vitals reviewed.  Constitutional:      General: She is not in acute distress.    Appearance: She is ill-appearing.  Pulmonary:     Effort: Pulmonary effort is normal.  Neurological:     Mental Status: She is unresponsive.     Vital Signs:  BP (!) 145/38 (BP Location: Left Arm)   Pulse 98   Temp 99.7 F (37.6 C)   Resp 16   Wt 64 kg   SpO2 98%  BMI 23.12 kg/m   Palliative Assessment/Data: PPS 10%     Assessment & Plan:   SUMMARY OF RECOMMENDATIONS   Continue comfort measures Continue morphine infusion No labs, antibiotics, IV fluids, or cardiac monitoring Referral to hospice facility in [place] PMT will continue to follow  Symptom Management:  Lorazepam (ATIVAN) prn for anxiety Haloperidol (HALDOL) prn for agitation  Glycopyrrolate (ROBINUL) for excessive secretions Ondansetron (ZOFRAN) prn for nausea Polyvinyl alcohol (LIQUIFILM TEARS) prn for dry eyes Antiseptic oral rinse (BIOTENE) prn for dry  mouth  Primary Decision Maker: Shannon Chen - designated point of contact for the family  Code Status/Advance Care Planning: DNR  Prognosis:  < 2 weeks    Thank you for allowing [place] to participate in the care of Shannon Chen  MDM - High   Signed by: Elie Confer, NP Palliative Medicine Team  Team Phone # [phone]  For individual providers, please see AMION

## 2022-02-26 NOTE — Progress Notes (Signed)
Nutrition Brief Note  Chart reviewed. Pt now transitioning to comfort care.  No further nutrition interventions planned at this time.  Please re-consult as needed.   Amiaya Mcneeley M Sergi Gellner, RD, LDN, CNSC.   

## 2022-02-26 NOTE — Progress Notes (Addendum)
Patient noted to have increased work of breathing, elevated HR in 110-120s, elevated temperature. Patient on 4L [place] currently. Dr. Erlinda Hong made aware.  Ice packs applied to patient for now

## 2022-02-26 NOTE — Progress Notes (Addendum)
STROKE TEAM PROGRESS NOTE   INTERVAL HISTORY No family at bedside. Pt barely open eyes on voice, still has high grade fever on tylenol IV PRN. Mild tachypnea, will change morphine PRN to low dose drip. Continue comfort care measures.   Vitals:   02/26/22 0822 02/26/22 1313 02/26/22 1333 02/26/22 1548  BP: (!) 145/38     Pulse: (!) 119   98  Resp: 16     Temp: (!) 102.9 F (39.4 C) (!) 102 F (38.9 C)  99.7 F (37.6 C)  TempSrc: Oral     SpO2: 96%  90% 98%  Weight:       CBC:  Recent Labs  Lab 02/24/22 2117 02/25/22 0429  WBC 8.7 11.0*  HGB 8.0* 8.5*  HCT 26.9* 28.9*  MCV 91.5 92.3  PLT 183 621   Basic Metabolic Panel:  Recent Labs  Lab 02/21/22 0654 02/22/22 0900 02/22/22 0901 02/24/22 2117 02/25/22 0429  NA 145  --    < > 149* 150*  K 3.8  --    < > 4.1 4.4  CL 120*  --    < > 118* 118*  CO2 18*  --    < > 23 21*  GLUCOSE 185*  --    < > 244* 196*  BUN 23  --    < > 34* 35*  CREATININE 0.65  --    < > 0.89 0.87  CALCIUM 8.0*  --    < > 8.0* 8.1*  MG 1.9 2.1  --   --   --   PHOS 3.0 3.4  --   --   --    < > = values in this interval not displayed.   Lipid Panel:  Recent Labs  Lab 02/20/22 0326  TRIG 140   HgbA1c:  No results for input(s): "HGBA1C" in the last 168 hours. Urine Drug Screen: No results for input(s): "LABOPIA", "COCAINSCRNUR", "LABBENZ", "AMPHETMU", "THCU", "LABBARB" in the last 168 hours.  Alcohol Level  No results for input(s): "ETH" in the last 168 hours.  IMAGING past 24 hours No results found.  PHYSICAL EXAM General: mild acute distress due to tachypnea, drowsy sleepy Respiratory: labored breathing from time to time with deep secretions  Neuro: limited exam due to comfort care measures. Pt slightly open eyes on voice, but not able to keep eyes open. Mild tachypnea. Nonverbal, not following commands. Left facial droop. No spontaneous movement.     ASSESSMENT/PLAN Ms. NIALAH SARAVIA is a 77 y.o. 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  TNK was not offered due to low platelet count, and mechanical thrombectomy was performed.  TICI2B flow was achieved in the right MCA, however procedure was complicated by vessel rupture requiring vessel sacrifice with coil embolization.  Patient remained intubated after procedure and was transferred to the ICU.  Pt now extubated on room air but likely aspirating, requiring regular suctioning.  Also UTI(+) with Rocephin.  Will have pacemaker interrogated to look for possible afib as cause for stroke.  Transferring out of ICU.  Will monitor for improvement and transfer to SNF.  If condition begins to worsen we can discuss [place] as pt had a one way extubation and is now DNR.  Will proceed with comfort care.  Stroke:  right MCA stroke due to occlusion of dominant right M2 s/p IR with FYBO1B complicated by [facility] and vessel sacrifice with coil embolization, etiology likely due to severe intracranial atherosclerotic disease Code Stroke CT head No acute abnormality.  Multiple old infarcts.   CTA head & neck occlusion of right M2 superior division branch, 60% stenosis of left ICA, severe stenosis of right V2 segment, moderate stenosis of basilar artery MRI large acute right MCA territory infarct, as well as numerous smaller infarcts in bilateral cerebral and cerebellar hemispheres, subarachnoid hemorrhage at right sylvian fissure, numerous chronic microhemorrhages suspicious for CAA Repeat CT scan 02/21/22 showed evolving right MCA infarct with small hyperdensity in the sylvian fissure. 2D Echo EF 70 to 75%, normal left atrial size, no atrial level shunt Pacemaker interrogation  LDL 31 HgbA1c 6.4 VTE prophylaxis  -Lovenox aspirin 81 mg daily and clopidogrel 75 mg daily prior to admission, on no antithrombotics for comfort care On morphine drip now  Respiratory failure Aspiration pneumonia Fever  Leukocytosis  Remain intubated post IR One way extubation 02/21/22, on [place] Copious secretion with intermittent SOB Tmax 100.5->102.9->103->103.1->102 WBC 8.8->10.7 -> 11.0 Was on rocephin   UTI Fever 103.4->102 UA WBC > 46 Was treated with rocephin  Carotid stenosis S/p right CEA in the past CTA head and neck - Left ICA 60% Was on ASA, now in comfort care  Hypertension Home meds: Norvasc 2.5 mg daily Was on norvasc 2.5mg  and metoprolol 25mg  bid Now off BP meds for comfort care  Hyperlipidemia Home meds: Rosuvastatin 40 mg daily, resumed in hospital LDL 31, goal < 70  Dysphagia  NPO Was on TF Now d/c NG tube for comfort   Other Stroke Risk Factors Advanced Age >/= 24  Former cigarette smoker Hx stroke Coronary artery disease 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  Other Active Problems Thrombocytopenia platelet 86->101->143 Hypernatremia Na 149 -> 150 AVB on pacer Kidney mass  Hospital day # 8    Rosalin Hawking, MD PhD Stroke Neurology 02/26/2022 4:08 PM     To contact Stroke Continuity provider, please refer to [email or web address] After hours, contact General Neurology.If 7pm- 7am, please page neurology on call as listed in [place].

## 2022-02-26 NOTE — Progress Notes (Signed)
Patient arrived overnight from 3W as a comfort care. Patient unable to answer admission questions and does not have family at the bedside. Admission will be deferred at this time. Assessment charted. No signs of distress.

## 2022-02-27 DIAGNOSIS — Z515 Encounter for palliative care: Secondary | ICD-10-CM

## 2022-02-27 DIAGNOSIS — J9601 Acute respiratory failure with hypoxia: Secondary | ICD-10-CM | POA: Diagnosis not present

## 2022-02-27 DIAGNOSIS — J9602 Acute respiratory failure with hypercapnia: Secondary | ICD-10-CM

## 2022-02-27 DIAGNOSIS — I63511 Cerebral infarction due to unspecified occlusion or stenosis of right middle cerebral artery: Secondary | ICD-10-CM | POA: Diagnosis not present

## 2022-02-27 DIAGNOSIS — R509 Fever, unspecified: Secondary | ICD-10-CM | POA: Diagnosis not present

## 2022-02-27 LAB — CULTURE, BLOOD (ROUTINE X 2)
Special Requests: ADEQUATE
Special Requests: ADEQUATE

## 2022-02-27 MED ORDER — GLYCOPYRROLATE 1 MG PO TABS: 1.0000 mg | ORAL_TABLET | ORAL | Status: AC | PRN

## 2022-02-27 MED ORDER — ONDANSETRON HCL 4 MG/2ML IJ SOLN: 4.0000 mg | Freq: Four times a day (QID) | INTRAMUSCULAR | 0 refills | Status: AC | PRN

## 2022-02-27 MED ORDER — LORAZEPAM 2 MG PO TABS: 2.0000 mg | ORAL_TABLET | ORAL | 0 refills | Status: AC | PRN

## 2022-02-27 MED ORDER — HALOPERIDOL 0.5 MG PO TABS: 0.5000 mg | ORAL_TABLET | ORAL | Status: AC | PRN

## 2022-02-27 MED ORDER — HALOPERIDOL LACTATE 5 MG/ML IJ SOLN: 0.5000 mg | INTRAMUSCULAR | Status: AC | PRN

## 2022-02-27 MED ORDER — ACETAMINOPHEN 10 MG/ML IV SOLN: 1000.0000 mg | Freq: Four times a day (QID) | INTRAVENOUS | Status: DC | PRN

## 2022-02-27 MED ORDER — HALOPERIDOL LACTATE 2 MG/ML PO CONC: 0.5000 mg | ORAL | 0 refills | Status: AC | PRN

## 2022-02-27 MED ORDER — LORAZEPAM 1 MG PO TABS: 1.0000 mg | ORAL_TABLET | ORAL | 0 refills | Status: AC | PRN

## 2022-02-27 MED ORDER — POLYVINYL ALCOHOL 1.4 % OP SOLN: 1.0000 [drp] | Freq: Four times a day (QID) | OPHTHALMIC | 0 refills | Status: AC | PRN

## 2022-02-27 MED ORDER — ONDANSETRON HCL 4 MG PO TABS: 4.0000 mg | ORAL_TABLET | Freq: Four times a day (QID) | ORAL | 0 refills | Status: AC | PRN

## 2022-02-27 MED ORDER — GLYCOPYRROLATE 0.2 MG/ML IJ SOLN: 0.2000 mg | INTRAMUSCULAR | Status: AC | PRN

## 2022-02-27 MED ORDER — MORPHINE BOLUS VIA INFUSION
4.0000 mg | Freq: Once | INTRAVENOUS | Status: AC
  Administered 2022-02-27: 4 mg via INTRAVENOUS
  Filled 2022-02-27: qty 4

## 2022-02-27 MED ORDER — LORAZEPAM 2 MG/ML PO CONC: 1.0000 mg | ORAL | 0 refills | Status: AC | PRN

## 2022-02-27 MED ORDER — ACETAMINOPHEN 10 MG/ML IV SOLN
1000.0000 mg | Freq: Four times a day (QID) | INTRAVENOUS | Status: DC
  Administered 2022-02-27: 1000 mg via INTRAVENOUS
  Filled 2022-02-27: qty 100

## 2022-02-27 NOTE — TOC Transition Note (Addendum)
Transition of Care [facility]) - CM/SW Discharge Note   Patient Details  Name: Shannon Chen MRN: [id number] Date of Birth: Jan 08, 1946  Transition of [facility]) CM/SW Contact:  Curlene Labrum, RN Phone Number: 02/27/2022, 12:24 PM   Clinical Narrative:    CM spoke with Roselee Nova, CM with [facility] and the patient has been accepted at [facility] facility in [place], [place] once consents are signed and bed is available.  PTAR will be arranged once bed confirmation has been made.  02/27/2022 1553 - CM spoke with Roselee Nova, MSW with [facility] and the patient's Inpatient hospice bed at [facility] home in [place] at [place]. [place], [place] will be available at 6 pm tonight after the family signs consent forms.  PTAR was called by CM and transport was set up for 6 pm tonight.  Bedside nursing to call report to the facility.   PTAR packet present at the secretary's desk to include Medical necessity, facesheet, discharge summary and DNR.  Patient's PIV and oxygen to remain for transport to the facility.  02/27/2022 1614 - PTAR called back and patient schedule for transport as "ready".  PTAR states that transport time should be in the next couple of hours.   Final next level of care: [place] Barriers to Discharge: No Barriers Identified (waiting on available INpatient hospice facility through [facility])   Patient Goals and CMS Choice CMS Medicare.gov Compare Post Acute Care list provided to:: Patient Represenative (must comment) (patient's nephew) Choice offered to / list presented to : [place] / [place]  Discharge Placement                         Discharge Plan and Services Additional resources added to the After Visit Summary for       Post Acute Care Choice: Residential Hospice Bed                               Social Determinants of Health (SDOH) Interventions SDOH Screenings   Food  Insecurity: No Food Insecurity (12/10/2021)  Housing: Medium Risk (12/10/2021)  Transportation Needs: No Transportation Needs (12/10/2021)  Utilities: Not At Risk (12/10/2021)  Tobacco Use: Medium Risk (02/21/2022)     Readmission Risk Interventions    02/26/2022    4:42 PM 12/10/2021    2:16 PM 12/15/2020    4:10 PM  Readmission Risk Prevention Plan  Transportation Screening Complete Complete Complete  PCP or Specialist Appt within 5-7 Days   Complete  Home Care Screening   Complete  Medication Review (RN CM)   Complete  HRI or Home Care Consult  Complete   Social Work Consult for [place] Planning/Counseling  Complete   Palliative Care Screening  Not Applicable   Medication Review Press photographer) Complete Complete   PCP or Specialist appointment within 3-5 days of discharge Complete    HRI or [place] Complete    SW Recovery Care/Counseling Consult Complete    Palliative Care Screening Complete    Skilled Nursing Facility Complete

## 2022-02-27 NOTE — Progress Notes (Signed)
Manufacturing engineer [facility]) Hospital Liaison Note  Referral received for patient/family interest in [place]. Chart under review by [facility] physician.   Hospice eligibility confirmed.   Bed offered and accepted for transfer today. Unit RN please call report to [phone] prior to patient leaving the unit. Please send signed DNR and paperwork with patient at discharge.   Please leave all IV access and ports in place.   Call with any questions or concerns. Thank you  Roselee Nova, [facility] Liaison  [phone]

## 2022-02-27 NOTE — Progress Notes (Signed)
Patient given IV bolus 4 mg of morphine as ordered. Patient d/c with PIV clean, dry and intact. Sherlyn Hay, LPN

## 2022-02-27 NOTE — Progress Notes (Signed)
Report given to Marine scientist at [facility] in [place], [place]. IV intact and patent at this time

## 2022-02-27 NOTE — Discharge Summary (Signed)
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 s/p IR with TICI2b complicated by [employer] [facility] and vessel sacrifice with coil embolization, etiology likely due to severe intracranial atherosclerotic disease   Active Problems:   Acute respiratory failure with hypoxia ([name])   Aspiration pneumonia   Malnutrition of moderate degree   SAH (subarachnoid hemorrhage) ([name])   Fever   Leukocytosis   UTI   Carotid stenosis   HTN   HLD   Dysphagia   CAD   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    Hypernatremia   Thrombocytopenia    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   Hospice care patient   Allergies as of 02/27/2022   No Known Allergies      Medication List     STOP taking these medications    albuterol 108 (90 Base) MCG/ACT inhaler Commonly known as: VENTOLIN HFA   alum & mag hydroxide-simeth 200-200-20 MG/5ML suspension Commonly known as: MAALOX/MYLANTA   amLODipine 2.5 MG tablet Commonly known as: NORVASC   aspirin EC 81 MG tablet   Biofreeze 4 % Gel Generic drug: Menthol (Topical Analgesic)   calcium carbonate 1250 (500 Ca) MG tablet Commonly known as: OS-CAL - dosed in mg of elemental calcium   cephALEXin 500 MG capsule Commonly known as: KEFLEX   clopidogrel 75 MG tablet Commonly known as: PLAVIX   cyanocobalamin 1000 MCG tablet   dapagliflozin propanediol 10 MG Tabs tablet Commonly known as: FARXIGA   feeding supplement (PRO-STAT 64) Liqd   feeding supplement Liqd   folic acid 1 MG tablet Commonly known as: FOLVITE   Gemtesa 75 MG Tabs Generic drug: Vibegron   Kerendia 20 MG Tabs Generic drug: Finerenone   metoprolol succinate  50 MG 24 hr tablet Commonly known as: TOPROL-XL   mirtazapine 7.5 MG tablet Commonly known as: REMERON   multivitamin with minerals Tabs tablet   oxyCODONE-acetaminophen 5-325 MG tablet Commonly known as: PERCOCET/ROXICET   pantoprazole 40 MG tablet Commonly known as: PROTONIX   polyethylene glycol 17 g packet Commonly known as: MiraLax   potassium chloride 10 MEQ tablet Commonly known as: KLOR-CON   predniSONE 20 MG tablet Commonly known as: DELTASONE   rosuvastatin 40 MG tablet Commonly known as: CRESTOR   Trelegy Ellipta 200-62.5-25 MCG/ACT Aepb Generic drug: Fluticasone-Umeclidin-Vilant   Vitamin D (Ergocalciferol) 1.25 MG (50000 UNIT) Caps capsule Commonly known as: DRISDOL       TAKE these medications    acetaminophen 325 MG tablet Commonly known as: TYLENOL Take 650 mg by mouth every 4 (four) hours as needed for mild pain or moderate pain.   glycopyrrolate 1 MG tablet Commonly known as: ROBINUL Place 1 tablet (1 mg total) into feeding tube every 4 (four) hours as needed (excessive secretions).   glycopyrrolate 0.2 MG/ML injection Commonly known as: ROBINUL Inject 1 mL (0.2 mg total) into the skin every 4 (four) hours as needed (excessive secretions).   glycopyrrolate 0.2 MG/ML injection Commonly known as: ROBINUL Inject 1 mL (0.2 mg total) into the vein every 4 (four) hours as needed (excessive secretions).   haloperidol 0.5  MG tablet Commonly known as: HALDOL Place 1 tablet (0.5 mg total) into feeding tube every 4 (four) hours as needed for agitation (or delirium).   haloperidol lactate 5 MG/ML injection Commonly known as: HALDOL Inject 0.1 mLs (0.5 mg total) into the vein every 4 (four) hours as needed (or delirium).   haloperidol 2 MG/ML solution Commonly known as: HALDOL Place 0.3 mLs (0.6 mg total) under the tongue every 4 (four) hours as needed for agitation (or delirium).   LORazepam 1 MG tablet Commonly known as: ATIVAN Take 1 tablet  (1 mg total) by mouth every 4 (four) hours as needed for anxiety.   LORazepam 2 MG/ML concentrated solution Commonly known as: ATIVAN Place 0.5 mLs (1 mg total) under the tongue every 4 (four) hours as needed for anxiety.   LORazepam 2 MG tablet Commonly known as: ATIVAN Take 1 tablet (2 mg total) by mouth every 4 (four) hours as needed for sleep or anxiety.   ondansetron 4 MG tablet Commonly known as: ZOFRAN Place 1 tablet (4 mg total) into feeding tube every 6 (six) hours as needed for nausea.   ondansetron 4 MG/2ML Soln injection Commonly known as: ZOFRAN Inject 2 mLs (4 mg total) into the vein every 6 (six) hours as needed for nausea.   polyvinyl alcohol 1.4 % ophthalmic solution Commonly known as: LIQUIFILM TEARS Place 1 drop into both eyes 4 (four) times daily as needed for dry eyes.        LABORATORY STUDIES CBC    Component Value Date/Time   WBC 11.0 (H) 02/25/2022 0429   RBC 3.13 (L) 02/25/2022 0429   HGB 8.5 (L) 02/25/2022 0429   HCT 28.9 (L) 02/25/2022 0429   PLT 214 02/25/2022 0429   MCV 92.3 02/25/2022 0429   MCH 27.2 02/25/2022 0429   MCHC 29.4 (L) 02/25/2022 0429   RDW 15.9 (H) 02/25/2022 0429   LYMPHSABS 0.8 02/19/2022 0612   MONOABS 0.6 02/19/2022 0612   EOSABS 0.0 02/19/2022 0612   BASOSABS 0.0 02/19/2022 0612   CMP    Component Value Date/Time   NA 150 (H) 02/25/2022 0429   K 4.4 02/25/2022 0429   CL 118 (H) 02/25/2022 0429   CO2 21 (L) 02/25/2022 0429   GLUCOSE 196 (H) 02/25/2022 0429   BUN 35 (H) 02/25/2022 0429   CREATININE 0.87 02/25/2022 0429   CALCIUM 8.1 (L) 02/25/2022 0429   PROT 5.3 (L) 02/18/2022 2353   ALBUMIN 1.9 (L) 02/18/2022 2353   AST 19 02/18/2022 2353   ALT 20 02/18/2022 2353   ALKPHOS 118 02/18/2022 2353   BILITOT 1.0 02/18/2022 2353   GFRNONAA >60 02/25/2022 0429   GFRAA >60 11/18/2019 0619   COAGS Lab Results  Component Value Date   INR 1.3 (H) 02/18/2022   INR 1.1 12/29/2020   INR 1.0 12/28/2020   Lipid  Panel    Component Value Date/Time   CHOL 70 02/19/2022 0420   TRIG 140 02/20/2022 0326   HDL 12 (L) 02/19/2022 0420   CHOLHDL 5.8 02/19/2022 0420   VLDL 27 02/19/2022 0420   LDLCALC 31 02/19/2022 0420   HgbA1C  Lab Results  Component Value Date   HGBA1C 7.1 (H) 02/19/2022   Urinalysis    Component Value Date/Time   COLORURINE YELLOW 02/25/2022 0002   APPEARANCEUR CLOUDY (A) 02/25/2022 0002   LABSPEC 1.027 02/25/2022 0002   PHURINE 5.0 02/25/2022 0002   GLUCOSEU NEGATIVE 02/25/2022 0002   HGBUR SMALL (A) 02/25/2022 0002   BILIRUBINUR  NEGATIVE 02/25/2022 0002   KETONESUR NEGATIVE 02/25/2022 0002   PROTEINUR 100 (A) 02/25/2022 0002   NITRITE NEGATIVE 02/25/2022 0002   LEUKOCYTESUR SMALL (A) 02/25/2022 0002   Urine Drug Screen     Component Value Date/Time   LABOPIA NONE DETECTED 04/24/2018 1600   COCAINSCRNUR NONE DETECTED 04/24/2018 1600   LABBENZ NONE DETECTED 04/24/2018 1600   AMPHETMU NONE DETECTED 04/24/2018 1600   THCU NONE DETECTED 04/24/2018 1600   LABBARB NONE DETECTED 04/24/2018 1600    Alcohol Level    Component Value Date/Time   ETH <10 02/18/2022 2351     SIGNIFICANT DIAGNOSTIC STUDIES DG CHEST PORT 1 VIEW  Result Date: 02/24/2022 CLINICAL DATA:  Tachypnea. EXAM: PORTABLE CHEST 1 VIEW COMPARISON:  02/22/2022 and older studies. FINDINGS: Cardiac silhouette mildly enlarged.  No mediastinal or hilar masses. Clear lungs. Nasal/orogastric tube is stable, tip in the distal stomach. Stable left anterior chest wall dual lead pacemaker. IMPRESSION: 1. No acute cardiopulmonary disease. No change from the most recent prior study. Electronically Signed   By: Amie Portland M.D.   On: 02/24/2022 21:17   DG CHEST PORT 1 VIEW  Result Date: 02/22/2022 CLINICAL DATA:  Fever, short of breath EXAM: PORTABLE CHEST 1 VIEW COMPARISON:  CT scan of the chest 02/18/2022; abdominal radiograph yesterday FINDINGS: Gastric tube remains present. Left subclavian approach cardiac rhythm  maintenance device with leads projecting over the right atrium and right ventricle. Atherosclerotic calcifications present in the transverse aorta. Stable cardiomegaly. Nonspecific mild patchy airspace opacity in the left lung base. Trace blunting of the left costophrenic angle. Small effusion suspected. No pulmonary edema. No pneumothorax. No acute osseous abnormality. IMPRESSION: Mild patchy/streaky airspace opacity in the left lung base may reflect atelectasis or infiltrate. Suspect small left pleural effusion. Electronically Signed   By: Malachy Moan M.D.   On: 02/22/2022 08:06   DG Abd Portable 1V  Result Date: 02/21/2022 CLINICAL DATA:  Nasogastric tube placement EXAM: PORTABLE ABDOMEN - 1 VIEW COMPARISON:  02/21/2022 FINDINGS: Since the prior examination, the nasogastric tube is been advanced and its tip is now seen in the expected gastric antrum. Visualized abdominal gas pattern is nonobstructive. No gross free intraperitoneal gas within the visualized abdomen. Visualized lung bases are clear. Cardiac size within normal limits. Pacemaker leads noted within the right heart. Aortic stent graft is partially visualized. IMPRESSION: 1. Nasogastric tube tip advanced, now within the gastric antrum. Electronically Signed   By: Helyn Numbers M.D.   On: 02/21/2022 19:09   DG Abd Portable 1V  Result Date: 02/21/2022 CLINICAL DATA:  Orogastric tube placement. EXAM: PORTABLE ABDOMEN - 1 VIEW COMPARISON:  Radiograph 02/19/2022 FINDINGS: Tip of the enteric tube is below the diaphragm in the stomach. The side-port is in the region of the gastroesophageal junction. Nonspecific but nonobstructive upper abdominal bowel gas pattern. Aorto bi-iliac stent graft in place. IMPRESSION: Tip of the enteric tube below the diaphragm in the stomach. The side-port is in the region of the gastroesophageal junction. Advancement of at least 3 cm would place the side-port below the diaphragm. Electronically Signed   By: Narda Rutherford M.D.   On: 02/21/2022 17:44   CT HEAD WO CONTRAST ( )  Result Date: 02/21/2022 CLINICAL DATA:  Stroke, follow up EXAM: CT HEAD WITHOUT CONTRAST TECHNIQUE: Contiguous axial images were obtained from the base of the skull through the vertex without intravenous contrast. RADIATION DOSE REDUCTION: This exam was performed according to the departmental dose-optimization program which includes automated exposure control, adjustment  of the mA and/or kV according to patient size and/or use of iterative reconstruction technique. COMPARISON:  Flat panel CT from IR procedure 02/19/2022. MRI head from 02/19/2022. FINDINGS: Brain: Small amount of hyperdensity within the right sylvian fissure and overlying frontal convexity appears decreased relative to flat panel CT from 02/19/2022 interventional procedure. Evolving infarct in the right MCA distribution, better characterized on prior MRI. Embolization coils in this region. No hydrocephalus. No mass lesion. Vascular: See above. Skull: No acute fracture. Sinuses/Orbits: No acute findings. Other: No mastoid effusions. IMPRESSION: 1. 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 Therefore, this is favored to largely represent contrast staining; however, a small amount of subarachnoid hemorrhage is probable given appearance on recent MRI. Heidelberg bleeding classification 3c. 2. No progressive mass effect. Electronically Signed   By: Feliberto Harts M.D.   On: 02/21/2022 09:01   IR PERCUTANEOUS ART THROMBECTOMY/INFUSION INTRACRANIAL INC DIAG ANGIO  Result Date: 02/20/2022 INDICATION: 77 year old female presenting with sudden onset left-sided weakness, aphasia and right gaze deviation; NIHSS 22 (later improved to 18). For past medical history significant  for coronary artery disease, heart block status post ppm, hypertension, hyperlipidemia, right carotid end arterectomy, kidney mass, peripheral arterial disease with penetrating atherosclerotic ulcer of aorta and common iliac artery aneurysm; baseline modified Rankin scale 4. Head CT showed a large acute territory infarct or hemorrhage. CT angiogram of the head and neck showed a right M2/MCA occlusion. He was taken to our service for mechanical thrombectomy. EXAM: ULTRASOUND-GUIDED VASCULAR ACCESS DIAGNOSTIC CEREBRAL ANGIOGRAM MECHANICAL THROMBECTOMY TRANSCATHETER EMBOLIZATION FLAT PANEL HEAD CT COMPARISON:  CT/CT angiogram of the head and neck February 18, 2022. MEDICATIONS: No antibiotics utilized. ANESTHESIA/SEDATION: The procedure was performed under general anesthesia. CONTRAST:  110 mL of Omnipaque 300 milligram/mL. FLUOROSCOPY: Radiation Exposure Index (as provided by the fluoroscopic device): 2,242 mGy Kerma COMPLICATIONS: SIR LEVEL B - Normal therapy, includes overnight admission for observation. TECHNIQUE: Informed written consent was obtained from the patient's niece after a thorough discussion of the procedural risks, benefits and alternatives. All questions were addressed. Maximal Sterile Barrier Technique was utilized including caps, mask, sterile gowns, sterile gloves, sterile drape, hand hygiene and skin antiseptic. A timeout was performed prior to the initiation of the procedure. Using the modified Seldinger technique and a micropuncture kit, access was gained to the right radial artery at the wrist and a 6 French sheath was placed. Real-time ultrasound guidance was utilized for vascular access including the acquisition of a permanent ultrasound image documenting patency of the accessed vessel. Slow intra arterial infusion of 200 mcg nitroglycerin diluted in patient's own blood was performed. No significant fluctuation in patient's blood pressure seen. Then, a right radial artery angiogram was obtained  via sheath side port. Next, a 6 [place] benchmark catheter was navigated over a 0.035" Terumo Glidewire into the right subclavian artery under fluoroscopic guidance. Under fluoroscopy, the catheter was then advanced into the distal cervical segment of the right ICA. Frontal and lateral angiograms of the head were obtained. FINDINGS: 1. Normal brachial artery branching pattern seen. No significant anatomical variation. The right radial artery caliber is adequate for vascular access. Atherosclerotic changes are noted along the right ulnar artery without hemodynamically significant stenosis. 2. 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 3. Occlusion of a dominant right M2/MCA anterior division branch. Luminal irregularity of the M1 and proximal M2 segments of the right MCA, consistent with atherosclerotic disease. Severe stenosis at the proximal right A1/ACA. 4. Opacification of the right PCA vascular tree via a prominent posterior communicating artery. Luminal irregularity noted along the left PCA, consistent with atherosclerotic disease with moderate stenosis at the distal P2 segment. PROCEDURE: Using biplane roadmap guidance, a 5 [place] Sofia aspiration catheter was navigated over a phenom 21 microcatheter and an Aristotle 14 microguidewire into the cavernous segment of the right ICA. The microcatheter was then navigated over the wire into the right M2/MCA anterior division branch. Then, a 3 x 20 mm solitaire stent retriever was deployed spanning the M2 segment. The device was allowed to intercalated with the clot for 4 minutes. The aspiration catheter was advanced to the M1 segment and connected to an aspiration pump. The thrombectomy device and aspiration catheter were removed under constant aspiration. The guide catheter was aspirated for debris. Right internal carotid  artery angiograms with lateral views the showed partial recanalization of the right MCA (T2b) with 2 M3 branches to the perirolandic area occluded. Using biplane roadmap guidance, a 5 [place] Sofia aspiration catheter was navigated over a phenom 21 microcatheter and an Aristotle 14 microguidewire into the cavernous segment of the right ICA. Upon navigating microcatheter over the wire into the right M2/MCA unexpected deflection of the catheter was noted. Frontal and lateral angiograms with microcatheter contrast injection were performed showing contrast extravasation. Transcatheter embolization of injured vessel was performed with 3 detachable coils (2 x 3 mm target XL soft (x2) and 2 x 3 cm target Nano). Follow-up right internal carotid artery angiograms with frontal and lateral views of the head showed in significant decrease of contrast extravasation with mild persistent extravasation. The microcatheter was again navigated into the affected branch. Microcatheter contrast injection showed persistent contrast extravasation. Additional coil was placed more proximally (1.5 x 3 mm target 360 nano coil) resulting in branch occlusion. Right internal carotid artery angiograms with frontal and lateral views of the head showed occlusion of the right ICA terminus with new clot formation. Using biplane roadmap guidance, a 5 [place] Sofia aspiration catheter was navigated over an Aristotle 24 microguidewire into the cavernous segment of the right ICA. The aspiration catheter was then advanced to the level of occlusion and connected to an aspiration pump. Continuous aspiration was performed for 2 minutes. The guide catheter was connected to a VacLok syringe. The aspiration catheter was subsequently removed under constant aspiration. The guide catheter was aspirated for debris. Right internal carotid artery angiograms with frontal and lateral views of the head showed complete recanalization of the right ICA and M1 segment. The  posterior division remains patent. However, the anterior division is occluded. Given hemorrhagic complication from minimal catheter manipulation, decision was made to abort the procedure. Flat panel CT of the head was obtained and post processed in a separate workstation with concurrent attending physician supervision. Selected images were sent to PACS. Expected right sylvian fissure contrast extravasation. The catheter was subsequently withdrawn. An inflatable band was placed and inflated over the right wrist access site. The vascular sheath was withdrawn and the band was slowly deflated until brisk flow was noted through the arteriotomy site. At this point, the band was reinflated with additional 4 cc of air to obtain patent hemostasis. IMPRESSION: Mechanical thrombectomy performed for treatment of a dominant right M2/MCA occlusion complicated by vessel perforation treated  with detachable coils. Given need for vessel sacrifice, final MCA status is TICI 2A, similar to the beginning of the procedure. PLAN: Refer to EMR. Electronically Signed   By: Baldemar Lenis M.D.   On: 02/20/2022 13:02   IR US Guide Vasc Access Right  Result Date: 02/20/2022 INDICATION: 77 year old female presenting with sudden onset left-sided weakness, aphasia and right gaze deviation; NIHSS 22 (later improved to 18). 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 Head CT showed a large acute territory infarct or hemorrhage. CT angiogram of the head and neck showed a right M2/MCA occlusion. He was taken to our service for mechanical thrombectomy. EXAM: ULTRASOUND-GUIDED VASCULAR ACCESS DIAGNOSTIC CEREBRAL ANGIOGRAM MECHANICAL THROMBECTOMY TRANSCATHETER EMBOLIZATION  FLAT PANEL HEAD CT COMPARISON:  CT/CT angiogram of the head and neck February 18, 2022. MEDICATIONS: No antibiotics utilized. ANESTHESIA/SEDATION: The procedure was performed under general anesthesia. CONTRAST:  110 mL of Omnipaque 300 milligram/mL. FLUOROSCOPY: Radiation Exposure Index (as provided by the fluoroscopic device): 2,242 mGy Kerma COMPLICATIONS: SIR LEVEL B - Normal therapy, includes overnight admission for observation. TECHNIQUE: Informed written consent was obtained from the patient's niece after a thorough discussion of the procedural risks, benefits and alternatives. All questions were addressed. Maximal Sterile Barrier Technique was utilized including caps, mask, sterile gowns, sterile gloves, sterile drape, hand hygiene and skin antiseptic. A timeout was performed prior to the initiation of the procedure. Using the modified Seldinger technique and a micropuncture kit, access was gained to the right radial artery at the wrist and a 6 French sheath was placed. Real-time ultrasound guidance was utilized for vascular access including the acquisition of a permanent ultrasound image documenting patency of the accessed vessel. Slow intra arterial infusion of 200 mcg nitroglycerin diluted in patient's own blood was performed. No significant fluctuation in patient's blood pressure seen. Then, a right radial artery angiogram was obtained via sheath side port. Next, a 6 [place] benchmark catheter was navigated over a 0.035" Terumo Glidewire into the right subclavian artery under fluoroscopic guidance. Under fluoroscopy, the catheter was then advanced into the distal cervical segment of the right ICA. Frontal and lateral angiograms of the head were obtained. FINDINGS: 1. Normal brachial artery branching pattern seen. No significant anatomical variation. The right radial artery caliber is adequate for vascular access. Atherosclerotic changes are noted along the right ulnar artery without hemodynamically  significant stenosis. 2. 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 3. Occlusion of a dominant right M2/MCA anterior division branch. Luminal irregularity of the M1 and proximal M2 segments of the right MCA, consistent with atherosclerotic disease. Severe stenosis at the proximal right A1/ACA. 4. Opacification of the right PCA vascular tree via a prominent posterior communicating artery. Luminal irregularity noted along the left PCA, consistent with atherosclerotic disease with moderate stenosis at the distal P2 segment. PROCEDURE: Using biplane roadmap guidance, a 5 [place] Sofia aspiration catheter was navigated over a phenom 21 microcatheter and an Aristotle 14 microguidewire into the cavernous segment of the right ICA. The microcatheter was then navigated over the wire into the right M2/MCA anterior division branch. Then, a 3 x 20 mm solitaire stent retriever was deployed spanning the M2 segment. The device was  allowed to intercalated with the clot for 4 minutes. The aspiration catheter was advanced to the M1 segment and connected to an aspiration pump. The thrombectomy device and aspiration catheter were removed under constant aspiration. The guide catheter was aspirated for debris. Right internal carotid artery angiograms with lateral views the showed partial recanalization of the right MCA (T2b) with 2 M3 branches to the perirolandic area occluded. Using biplane roadmap guidance, a 5 [place] Sofia aspiration catheter was navigated over a phenom 21 microcatheter and an Aristotle 14 microguidewire into the cavernous segment of the right ICA. Upon navigating microcatheter over the wire into the right M2/MCA unexpected deflection of the catheter was noted. Frontal and lateral angiograms with microcatheter contrast injection were performed showing contrast extravasation.  Transcatheter embolization of injured vessel was performed with 3 detachable coils (2 x 3 mm target XL soft (x2) and 2 x 3 cm target Nano). Follow-up right internal carotid artery angiograms with frontal and lateral views of the head showed in significant decrease of contrast extravasation with mild persistent extravasation. The microcatheter was again navigated into the affected branch. Microcatheter contrast injection showed persistent contrast extravasation. Additional coil was placed more proximally (1.5 x 3 mm target 360 nano coil) resulting in branch occlusion. Right internal carotid artery angiograms with frontal and lateral views of the head showed occlusion of the right ICA terminus with new clot formation. Using biplane roadmap guidance, a 5 [place] Sofia aspiration catheter was navigated over an Aristotle 24 microguidewire into the cavernous segment of the right ICA. The aspiration catheter was then advanced to the level of occlusion and connected to an aspiration pump. Continuous aspiration was performed for 2 minutes. The guide catheter was connected to a VacLok syringe. The aspiration catheter was subsequently removed under constant aspiration. The guide catheter was aspirated for debris. Right internal carotid artery angiograms with frontal and lateral views of the head showed complete recanalization of the right ICA and M1 segment. The posterior division remains patent. However, the anterior division is occluded. Given hemorrhagic complication from minimal catheter manipulation, decision was made to abort the procedure. Flat panel CT of the head was obtained and post processed in a separate workstation with concurrent attending physician supervision. Selected images were sent to PACS. Expected right sylvian fissure contrast extravasation. The catheter was subsequently withdrawn. An inflatable band was placed and inflated over the right wrist access site. The vascular sheath was withdrawn and the band  was slowly deflated until brisk flow was noted through the arteriotomy site. At this point, the band was reinflated with additional 4 cc of air to obtain patent hemostasis. IMPRESSION: Mechanical thrombectomy performed for treatment of a dominant right M2/MCA occlusion complicated by vessel perforation treated with detachable coils. Given need for vessel sacrifice, final MCA status is TICI 2A, similar to the beginning of the procedure. PLAN: Refer to EMR. Electronically Signed   By: Baldemar Lenis M.D.   On: 02/20/2022 13:02   IR Angiogram Follow Up Study  Result Date: 02/20/2022 INDICATION: 77 year old female presenting with sudden onset left-sided weakness, aphasia and right gaze deviation; NIHSS 22 (later improved to 18). 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 Head CT showed a large acute territory infarct or hemorrhage. CT angiogram of the head and neck showed a right M2/MCA occlusion.  He was taken to our service for mechanical thrombectomy. EXAM: ULTRASOUND-GUIDED VASCULAR ACCESS DIAGNOSTIC CEREBRAL ANGIOGRAM MECHANICAL THROMBECTOMY TRANSCATHETER EMBOLIZATION FLAT PANEL HEAD CT COMPARISON:  CT/CT angiogram of the head and neck February 18, 2022. MEDICATIONS: No antibiotics utilized. ANESTHESIA/SEDATION: The procedure was performed under general anesthesia. CONTRAST:  110 mL of Omnipaque 300 milligram/mL. FLUOROSCOPY: Radiation Exposure Index (as provided by the fluoroscopic device): 2,242 mGy Kerma COMPLICATIONS: SIR LEVEL B - Normal therapy, includes overnight admission for observation. TECHNIQUE: Informed written consent was obtained from the patient's niece after a thorough discussion of the procedural risks, benefits and  alternatives. All questions were addressed. Maximal Sterile Barrier Technique was utilized including caps, mask, sterile gowns, sterile gloves, sterile drape, hand hygiene and skin antiseptic. A timeout was performed prior to the initiation of the procedure. Using the modified Seldinger technique and a micropuncture kit, access was gained to the right radial artery at the wrist and a 6 French sheath was placed. Real-time ultrasound guidance was utilized for vascular access including the acquisition of a permanent ultrasound image documenting patency of the accessed vessel. Slow intra arterial infusion of 200 mcg nitroglycerin diluted in patient's own blood was performed. No significant fluctuation in patient's blood pressure seen. Then, a right radial artery angiogram was obtained via sheath side port. Next, a 6 [place] benchmark catheter was navigated over a 0.035" Terumo Glidewire into the right subclavian artery under fluoroscopic guidance. Under fluoroscopy, the catheter was then advanced into the distal cervical segment of the right ICA. Frontal and lateral angiograms of the head were obtained. FINDINGS: 1. Normal brachial artery branching pattern seen. No significant anatomical variation. The right radial artery caliber is adequate for vascular access. Atherosclerotic changes are noted along the right ulnar artery without hemodynamically significant stenosis. 2. 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 3. Occlusion of a dominant right M2/MCA anterior division branch. Luminal irregularity of the M1 and proximal M2 segments of the right MCA, consistent with atherosclerotic disease. Severe stenosis at the proximal right A1/ACA. 4. Opacification of the right PCA vascular tree via a prominent posterior communicating artery. Luminal irregularity noted along the left PCA,  consistent with atherosclerotic disease with moderate stenosis at the distal P2 segment. PROCEDURE: Using biplane roadmap guidance, a 5 [place] Sofia aspiration catheter was navigated over a phenom 21 microcatheter and an Aristotle 14 microguidewire into the cavernous segment of the right ICA. The microcatheter was then navigated over the wire into the right M2/MCA anterior division branch. Then, a 3 x 20 mm solitaire stent retriever was deployed spanning the M2 segment. The device was allowed to intercalated with the clot for 4 minutes. The aspiration catheter was advanced to the M1 segment and connected to an aspiration pump. The thrombectomy device and aspiration catheter were removed under constant aspiration. The guide catheter was aspirated for debris. Right internal carotid artery angiograms with lateral views the showed partial recanalization of the right MCA (T2b) with 2 M3 branches to the perirolandic area occluded. Using biplane roadmap guidance, a 5 [place] Sofia aspiration catheter was navigated over a phenom 21 microcatheter and an Aristotle 14 microguidewire into the cavernous segment of the right ICA. Upon navigating microcatheter over the wire into the right M2/MCA unexpected deflection of the catheter was noted. Frontal and lateral angiograms with microcatheter contrast injection were performed showing contrast extravasation. Transcatheter embolization of injured vessel was performed with  3 detachable coils (2 x 3 mm target XL soft (x2) and 2 x 3 cm target Nano). Follow-up right internal carotid artery angiograms with frontal and lateral views of the head showed in significant decrease of contrast extravasation with mild persistent extravasation. The microcatheter was again navigated into the affected branch. Microcatheter contrast injection showed persistent contrast extravasation. Additional coil was placed more proximally (1.5 x 3 mm target 360 nano coil) resulting in branch occlusion. Right  internal carotid artery angiograms with frontal and lateral views of the head showed occlusion of the right ICA terminus with new clot formation. Using biplane roadmap guidance, a 5 [place] Sofia aspiration catheter was navigated over an Aristotle 24 microguidewire into the cavernous segment of the right ICA. The aspiration catheter was then advanced to the level of occlusion and connected to an aspiration pump. Continuous aspiration was performed for 2 minutes. The guide catheter was connected to a VacLok syringe. The aspiration catheter was subsequently removed under constant aspiration. The guide catheter was aspirated for debris. Right internal carotid artery angiograms with frontal and lateral views of the head showed complete recanalization of the right ICA and M1 segment. The posterior division remains patent. However, the anterior division is occluded. Given hemorrhagic complication from minimal catheter manipulation, decision was made to abort the procedure. Flat panel CT of the head was obtained and post processed in a separate workstation with concurrent attending physician supervision. Selected images were sent to PACS. Expected right sylvian fissure contrast extravasation. The catheter was subsequently withdrawn. An inflatable band was placed and inflated over the right wrist access site. The vascular sheath was withdrawn and the band was slowly deflated until brisk flow was noted through the arteriotomy site. At this point, the band was reinflated with additional 4 cc of air to obtain patent hemostasis. IMPRESSION: Mechanical thrombectomy performed for treatment of a dominant right M2/MCA occlusion complicated by vessel perforation treated with detachable coils. Given need for vessel sacrifice, final MCA status is TICI 2A, similar to the beginning of the procedure. PLAN: Refer to EMR. Electronically Signed   By: Baldemar Lenis M.D.   On: 02/20/2022 13:02   IR Angiogram Follow Up  Study  Result Date: 02/20/2022 INDICATION: 77 year old female presenting with sudden onset left-sided weakness, aphasia and right gaze deviation; NIHSS 22 (later improved to 18). 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 Head CT showed a large acute territory infarct or hemorrhage. CT angiogram of the head and neck showed a right M2/MCA occlusion. He was taken to our service for mechanical thrombectomy. EXAM: ULTRASOUND-GUIDED VASCULAR ACCESS DIAGNOSTIC CEREBRAL ANGIOGRAM MECHANICAL THROMBECTOMY TRANSCATHETER EMBOLIZATION FLAT PANEL HEAD CT COMPARISON:  CT/CT angiogram of the head and neck February 18, 2022. MEDICATIONS: No antibiotics utilized. ANESTHESIA/SEDATION: The procedure was performed under general anesthesia. CONTRAST:  110 mL of Omnipaque 300 milligram/mL. FLUOROSCOPY: Radiation Exposure Index (as provided by the fluoroscopic device): 2,242 mGy Kerma COMPLICATIONS: SIR LEVEL B - Normal therapy, includes overnight admission for observation. TECHNIQUE: Informed written consent was obtained from the patient's niece after a thorough discussion of the procedural risks, benefits and alternatives. All questions were addressed. Maximal Sterile Barrier Technique was utilized including caps, mask, sterile gowns, sterile gloves, sterile drape, hand hygiene and skin antiseptic. A timeout was performed prior to the initiation of the  procedure. Using the modified Seldinger technique and a micropuncture kit, access was gained to the right radial artery at the wrist and a 6 French sheath was placed. Real-time ultrasound guidance was utilized for vascular access including the acquisition of a permanent ultrasound image documenting patency of the accessed  vessel. Slow intra arterial infusion of 200 mcg nitroglycerin diluted in patient's own blood was performed. No significant fluctuation in patient's blood pressure seen. Then, a right radial artery angiogram was obtained via sheath side port. Next, a 6 [place] benchmark catheter was navigated over a 0.035" Terumo Glidewire into the right subclavian artery under fluoroscopic guidance. Under fluoroscopy, the catheter was then advanced into the distal cervical segment of the right ICA. Frontal and lateral angiograms of the head were obtained. FINDINGS: 1. Normal brachial artery branching pattern seen. No significant anatomical variation. The right radial artery caliber is adequate for vascular access. Atherosclerotic changes are noted along the right ulnar artery without hemodynamically significant stenosis. 2. 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 3. Occlusion of a dominant right M2/MCA anterior division branch. Luminal irregularity of the M1 and proximal M2 segments of the right MCA, consistent with atherosclerotic disease. Severe stenosis at the proximal right A1/ACA. 4. Opacification of the right PCA vascular tree via a prominent posterior communicating artery. Luminal irregularity noted along the left PCA, consistent with atherosclerotic disease with moderate stenosis at the distal P2 segment. PROCEDURE: Using biplane roadmap guidance, a 5 [place] Sofia aspiration catheter was navigated over a phenom 21 microcatheter and an Aristotle 14 microguidewire into the cavernous segment of the right ICA. The microcatheter was then navigated over the wire into the right M2/MCA anterior division branch. Then, a 3 x 20 mm solitaire stent retriever was deployed spanning the M2 segment. The device was allowed to intercalated with the clot for 4 minutes. The aspiration catheter was  advanced to the M1 segment and connected to an aspiration pump. The thrombectomy device and aspiration catheter were removed under constant aspiration. The guide catheter was aspirated for debris. Right internal carotid artery angiograms with lateral views the showed partial recanalization of the right MCA (T2b) with 2 M3 branches to the perirolandic area occluded. Using biplane roadmap guidance, a 5 [place] Sofia aspiration catheter was navigated over a phenom 21 microcatheter and an Aristotle 14 microguidewire into the cavernous segment of the right ICA. Upon navigating microcatheter over the wire into the right M2/MCA unexpected deflection of the catheter was noted. Frontal and lateral angiograms with microcatheter contrast injection were performed showing contrast extravasation. Transcatheter embolization of injured vessel was performed with 3 detachable coils (2 x 3 mm target XL soft (x2) and 2 x 3 cm target Nano). Follow-up right internal carotid artery angiograms with frontal and lateral views of the head showed in significant decrease of contrast extravasation with mild persistent extravasation. The microcatheter was again navigated into the affected branch. Microcatheter contrast injection showed persistent contrast extravasation. Additional coil was placed more proximally (1.5 x 3 mm target 360 nano coil) resulting in branch occlusion. Right internal carotid artery angiograms with frontal and lateral views of the head showed occlusion of the right ICA terminus with new clot formation. Using biplane roadmap guidance, a 5 [place] Sofia aspiration catheter was navigated over an Aristotle 24 microguidewire into the cavernous segment of the right ICA. The aspiration catheter was then advanced to the level of  occlusion and connected to an aspiration pump. Continuous aspiration was performed for 2 minutes. The guide catheter was connected to a VacLok syringe. The aspiration catheter was subsequently removed under  constant aspiration. The guide catheter was aspirated for debris. Right internal carotid artery angiograms with frontal and lateral views of the head showed complete recanalization of the right ICA and M1 segment. The posterior division remains patent. However, the anterior division is occluded. Given hemorrhagic complication from minimal catheter manipulation, decision was made to abort the procedure. Flat panel CT of the head was obtained and post processed in a separate workstation with concurrent attending physician supervision. Selected images were sent to PACS. Expected right sylvian fissure contrast extravasation. The catheter was subsequently withdrawn. An inflatable band was placed and inflated over the right wrist access site. The vascular sheath was withdrawn and the band was slowly deflated until brisk flow was noted through the arteriotomy site. At this point, the band was reinflated with additional 4 cc of air to obtain patent hemostasis. IMPRESSION: Mechanical thrombectomy performed for treatment of a dominant right M2/MCA occlusion complicated by vessel perforation treated with detachable coils. Given need for vessel sacrifice, final MCA status is TICI 2A, similar to the beginning of the procedure. PLAN: Refer to EMR. Electronically Signed   By: Baldemar LenisKatyucia  de Macedo Rodrigues M.D.   On: 02/20/2022 13:02   IR Angiogram Follow Up Study  Result Date: 02/20/2022 INDICATION: 77 year old female presenting with sudden onset left-sided weakness, aphasia and right gaze deviation; NIHSS 22 (later improved to 18). 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 Head CT showed a large acute territory infarct or hemorrhage. CT angiogram of the head  and neck showed a right M2/MCA occlusion. He was taken to our service for mechanical thrombectomy. EXAM: ULTRASOUND-GUIDED VASCULAR ACCESS DIAGNOSTIC CEREBRAL ANGIOGRAM MECHANICAL THROMBECTOMY TRANSCATHETER EMBOLIZATION FLAT PANEL HEAD CT COMPARISON:  CT/CT angiogram of the head and neck February 18, 2022. MEDICATIONS: No antibiotics utilized. ANESTHESIA/SEDATION: The procedure was performed under general anesthesia. CONTRAST:  110 mL of Omnipaque 300 milligram/mL. FLUOROSCOPY: Radiation Exposure Index (as provided by the fluoroscopic device): 2,242 mGy Kerma COMPLICATIONS: SIR LEVEL B - Normal therapy, includes overnight admission for observation. TECHNIQUE: Informed written consent was obtained from the patient's niece after a thorough discussion of the procedural risks, benefits and alternatives. All questions were addressed. Maximal Sterile Barrier Technique was utilized including caps, mask, sterile gowns, sterile gloves, sterile drape, hand hygiene and skin antiseptic. A timeout was performed prior to the initiation of the procedure. Using the modified Seldinger technique and a micropuncture kit, access was gained to the right radial artery at the wrist and a 6 French sheath was placed. Real-time ultrasound guidance was utilized for vascular access including the acquisition of a permanent ultrasound image documenting patency of the accessed vessel. Slow intra arterial infusion of 200 mcg nitroglycerin diluted in patient's own blood was performed. No significant fluctuation in patient's blood pressure seen. Then, a right radial artery angiogram was obtained via sheath side port. Next, a 6 [place] benchmark catheter was navigated over a 0.035" Terumo Glidewire into the right subclavian artery under fluoroscopic guidance. Under fluoroscopy, the catheter was then advanced into the distal cervical segment of the right ICA. Frontal and lateral angiograms of the head were obtained. FINDINGS: 1. Normal brachial artery  branching pattern seen. No significant anatomical variation. The right radial artery caliber is adequate for vascular access. Atherosclerotic changes are noted along the right ulnar artery without hemodynamically significant stenosis. 2. 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 3. Occlusion of a dominant right M2/MCA anterior division branch. Luminal irregularity of the M1 and proximal M2 segments of the right MCA, consistent with atherosclerotic disease. Severe stenosis at the proximal right A1/ACA. 4. Opacification of the right PCA vascular tree via a prominent posterior communicating artery. Luminal irregularity noted along the left PCA, consistent with atherosclerotic disease with moderate stenosis at the distal P2 segment. PROCEDURE: Using biplane roadmap guidance, a 5 [place] Sofia aspiration catheter was navigated over a phenom 21 microcatheter and an Aristotle 14 microguidewire into the cavernous segment of the right ICA. The microcatheter was then navigated over the wire into the right M2/MCA anterior division branch. Then, a 3 x 20 mm solitaire stent retriever was deployed spanning the M2 segment. The device was allowed to intercalated with the clot for 4 minutes. The aspiration catheter was advanced to the M1 segment and connected to an aspiration pump. The thrombectomy device and aspiration catheter were removed under constant aspiration. The guide catheter was aspirated for debris. Right internal carotid artery angiograms with lateral views the showed partial recanalization of the right MCA (T2b) with 2 M3 branches to the perirolandic area occluded. Using biplane roadmap guidance, a 5 [place] Sofia aspiration catheter was navigated over a phenom 21 microcatheter and an Aristotle 14 microguidewire into the cavernous segment of the right ICA. Upon navigating  microcatheter over the wire into the right M2/MCA unexpected deflection of the catheter was noted. Frontal and lateral angiograms with microcatheter contrast injection were performed showing contrast extravasation. Transcatheter embolization of injured vessel was performed with 3 detachable coils (2 x 3 mm target XL soft (x2) and 2 x 3 cm target Nano). Follow-up right internal carotid artery angiograms with frontal and lateral views of the head showed in significant decrease of contrast extravasation with mild persistent extravasation. The microcatheter was again navigated into the affected branch. Microcatheter contrast injection showed persistent contrast extravasation. Additional coil was placed more proximally (1.5 x 3 mm target 360 nano coil) resulting in branch occlusion. Right internal carotid artery angiograms with frontal and lateral views of the head showed occlusion of the right ICA terminus with new clot formation. Using biplane roadmap guidance, a 5 [place] Sofia aspiration catheter was navigated over an Aristotle 24 microguidewire into the cavernous segment of the right ICA. The aspiration catheter was then advanced to the level of occlusion and connected to an aspiration pump. Continuous aspiration was performed for 2 minutes. The guide catheter was connected to a VacLok syringe. The aspiration catheter was subsequently removed under constant aspiration. The guide catheter was aspirated for debris. Right internal carotid artery angiograms with frontal and lateral views of the head showed complete recanalization of the right ICA and M1 segment. The posterior division remains patent. However, the anterior division is occluded. Given hemorrhagic complication from minimal catheter manipulation, decision was made to abort the procedure. Flat panel CT of the head was obtained and post processed in a separate workstation with concurrent attending physician supervision. Selected images were sent to PACS.  Expected right sylvian fissure contrast extravasation. The catheter was subsequently withdrawn. An inflatable band was placed and inflated over the right wrist access  site. The vascular sheath was withdrawn and the band was slowly deflated until brisk flow was noted through the arteriotomy site. At this point, the band was reinflated with additional 4 cc of air to obtain patent hemostasis. IMPRESSION: Mechanical thrombectomy performed for treatment of a dominant right M2/MCA occlusion complicated by vessel perforation treated with detachable coils. Given need for vessel sacrifice, final MCA status is TICI 2A, similar to the beginning of the procedure. PLAN: Refer to EMR. Electronically Signed   By: Baldemar LenisKatyucia  de Macedo Rodrigues M.D.   On: 02/20/2022 13:02   IR Angiogram Follow Up Study  Result Date: 02/20/2022 INDICATION: 77 year old female presenting with sudden onset left-sided weakness, aphasia and right gaze deviation; NIHSS 22 (later improved to 18). 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 Head CT showed a large acute territory infarct or hemorrhage. CT angiogram of the head and neck showed a right M2/MCA occlusion. He was taken to our service for mechanical thrombectomy. EXAM: ULTRASOUND-GUIDED VASCULAR ACCESS DIAGNOSTIC CEREBRAL ANGIOGRAM MECHANICAL THROMBECTOMY TRANSCATHETER EMBOLIZATION FLAT PANEL HEAD CT COMPARISON:  CT/CT angiogram of the head and neck February 18, 2022. MEDICATIONS: No antibiotics utilized. ANESTHESIA/SEDATION: The procedure was performed under general anesthesia. CONTRAST:  110 mL of Omnipaque 300 milligram/mL. FLUOROSCOPY: Radiation Exposure Index (as provided by the fluoroscopic device): 2,242 mGy Kerma  COMPLICATIONS: SIR LEVEL B - Normal therapy, includes overnight admission for observation. TECHNIQUE: Informed written consent was obtained from the patient's niece after a thorough discussion of the procedural risks, benefits and alternatives. All questions were addressed. Maximal Sterile Barrier Technique was utilized including caps, mask, sterile gowns, sterile gloves, sterile drape, hand hygiene and skin antiseptic. A timeout was performed prior to the initiation of the procedure. Using the modified Seldinger technique and a micropuncture kit, access was gained to the right radial artery at the wrist and a 6 French sheath was placed. Real-time ultrasound guidance was utilized for vascular access including the acquisition of a permanent ultrasound image documenting patency of the accessed vessel. Slow intra arterial infusion of 200 mcg nitroglycerin diluted in patient's own blood was performed. No significant fluctuation in patient's blood pressure seen. Then, a right radial artery angiogram was obtained via sheath side port. Next, a 6 [place] benchmark catheter was navigated over a 0.035" Terumo Glidewire into the right subclavian artery under fluoroscopic guidance. Under fluoroscopy, the catheter was then advanced into the distal cervical segment of the right ICA. Frontal and lateral angiograms of the head were obtained. FINDINGS: 1. Normal brachial artery branching pattern seen. No significant anatomical variation. The right radial artery caliber is adequate for vascular access. Atherosclerotic changes are noted along the right ulnar artery without hemodynamically significant stenosis. 2. 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 3. Occlusion of a dominant right M2/MCA anterior division branch. Luminal irregularity of the M1 and proximal M2 segments of the right MCA,  consistent with atherosclerotic disease. Severe stenosis at the proximal right A1/ACA. 4. Opacification of the right PCA vascular tree via a prominent posterior communicating artery. Luminal irregularity noted along the left PCA, consistent with atherosclerotic disease with moderate stenosis at the distal P2 segment. PROCEDURE: Using biplane roadmap guidance, a 5 [place] Sofia aspiration  catheter was navigated over a phenom 21 microcatheter and an Aristotle 14 microguidewire into the cavernous segment of the right ICA. The microcatheter was then navigated over the wire into the right M2/MCA anterior division branch. Then, a 3 x 20 mm solitaire stent retriever was deployed spanning the M2 segment. The device was allowed to intercalated with the clot for 4 minutes. The aspiration catheter was advanced to the M1 segment and connected to an aspiration pump. The thrombectomy device and aspiration catheter were removed under constant aspiration. The guide catheter was aspirated for debris. Right internal carotid artery angiograms with lateral views the showed partial recanalization of the right MCA (T2b) with 2 M3 branches to the perirolandic area occluded. Using biplane roadmap guidance, a 5 [place] Sofia aspiration catheter was navigated over a phenom 21 microcatheter and an Aristotle 14 microguidewire into the cavernous segment of the right ICA. Upon navigating microcatheter over the wire into the right M2/MCA unexpected deflection of the catheter was noted. Frontal and lateral angiograms with microcatheter contrast injection were performed showing contrast extravasation. Transcatheter embolization of injured vessel was performed with 3 detachable coils (2 x 3 mm target XL soft (x2) and 2 x 3 cm target Nano). Follow-up right internal carotid artery angiograms with frontal and lateral views of the head showed in significant decrease of contrast extravasation with mild persistent extravasation. The microcatheter was again  navigated into the affected branch. Microcatheter contrast injection showed persistent contrast extravasation. Additional coil was placed more proximally (1.5 x 3 mm target 360 nano coil) resulting in branch occlusion. Right internal carotid artery angiograms with frontal and lateral views of the head showed occlusion of the right ICA terminus with new clot formation. Using biplane roadmap guidance, a 5 [place] Sofia aspiration catheter was navigated over an Aristotle 24 microguidewire into the cavernous segment of the right ICA. The aspiration catheter was then advanced to the level of occlusion and connected to an aspiration pump. Continuous aspiration was performed for 2 minutes. The guide catheter was connected to a VacLok syringe. The aspiration catheter was subsequently removed under constant aspiration. The guide catheter was aspirated for debris. Right internal carotid artery angiograms with frontal and lateral views of the head showed complete recanalization of the right ICA and M1 segment. The posterior division remains patent. However, the anterior division is occluded. Given hemorrhagic complication from minimal catheter manipulation, decision was made to abort the procedure. Flat panel CT of the head was obtained and post processed in a separate workstation with concurrent attending physician supervision. Selected images were sent to PACS. Expected right sylvian fissure contrast extravasation. The catheter was subsequently withdrawn. An inflatable band was placed and inflated over the right wrist access site. The vascular sheath was withdrawn and the band was slowly deflated until brisk flow was noted through the arteriotomy site. At this point, the band was reinflated with additional 4 cc of air to obtain patent hemostasis. IMPRESSION: Mechanical thrombectomy performed for treatment of a dominant right M2/MCA occlusion complicated by vessel perforation treated with detachable coils. Given need for  vessel sacrifice, final MCA status is TICI 2A, similar to the beginning of the procedure. PLAN: Refer to EMR. Electronically Signed   By: Baldemar Lenis M.D.   On: 02/20/2022 13:02   DG Abd Portable 1V  Result Date: 02/19/2022 CLINICAL DATA:  Enteric tube placement EXAM: PORTABLE ABDOMEN - 1 VIEW COMPARISON:  Abdominal radiograph dated 02/19/2022 at 12:20 a.m. FINDINGS: Enteric tube has been advanced with tip and  side hole projecting over the gastric body. Partially imaged pacemaker leads project over the right atrium and ventricle. Partially imaged aorto bi-iliac stent graft. Diffusely dilated gas-filled bowel loops are again seen. IMPRESSION: Enteric tube has been advanced with tip and side hole projecting over the gastric body. Electronically Signed   By: Agustin Cree M.D.   On: 02/19/2022 16:21   MR BRAIN WO CONTRAST  Result Date: 02/19/2022 CLINICAL DATA:  Right MCA infarct.  Status post thrombectomy. EXAM: MRI HEAD WITHOUT CONTRAST TECHNIQUE: Multiplanar, multiecho pulse sequences of the brain and surrounding structures were obtained without intravenous contrast. COMPARISON:  CT/CTA/CTP head 1 day prior. FINDINGS: Brain: There is extensive diffusion restriction with associated FLAIR signal abnormality throughout much of the right MCA distribution involving the lentiform nucleus, insula, corona radiata, and frontal, parietal, and temporal lobe cortex. There are scattered additional smaller infarcts in the bilateral cerebellar hemispheres and bilateral supratentorial cortex which are likely embolic in etiology. There is susceptibility artifact overlying the right cerebral hemisphere primarily in the sylvian fissure with associated sulcal FLAIR hyperintensity likely reflecting subarachnoid hemorrhage (Heidelberg classification 3c: Subarachnoid hemorrhage). Edema from the right MCA infarct results in sulcal effacement and partial effacement of the right lateral ventricle but no midline shift.  There are additional remote infarcts in the bilateral basal ganglia/corona radiata and background advanced chronic small-vessel ischemic change. There are numerous foci of SWI signal dropout throughout both cerebral hemispheres primarily peripherally distributed. There is no solid mass lesion. Vascular: The proximal flow voids of the major intracranial vasculature are normal. Skull and upper cervical spine: Choose Sinuses/Orbits: There is mild mucosal thickening in the paranasal sinuses. The globes and orbits are unremarkable. Other: There is a probable sebaceous cyst in the right cheek. IMPRESSION: 1. Large acute infarct in the right MCA distribution with edema and mild regional mass effect but no midline shift. 2. Numerous additional smaller infarcts in the bilateral cerebral and cerebellar hemispheres are likely embolic in nature. 3. Subarachnoid hemorrhage primarily in the right sylvian fissure. 4. Numerous peripherally distributed punctate chronic microhemorrhages suspicious for cerebral amyloid angiopathy. Electronically Signed   By: Lesia Hausen M.D.   On: 02/19/2022 15:50   ECHOCARDIOGRAM COMPLETE  Result Date: 02/19/2022    ECHOCARDIOGRAM REPORT   Patient Name:   MAKENNAH OMURA Date of Exam: 02/19/2022 Medical Rec #:  [id number]       Height:       65.5 in Accession #:    [id number]      Weight:       133.4 lb Date of Birth:  1945/02/19       BSA:          1.675 m Patient Age:    76 years        BP:           115/31 mmHg Patient Gender: F               HR:           83 bpm. Exam Location:  Inpatient Procedure: 2D Echo, Cardiac Doppler and Color Doppler Indications:    Stroke I63.9  History:        Patient has prior history of Echocardiogram examinations, most                 recent 12/30/2020. Cardiomyopathy, Previous Myocardial                 Infarction and CAD, PAD and COPD, Arrythmias:RBBB and Atrial  Fibrillation, Signs/Symptoms:Hypotension and Syncope; Risk                  Factors:Hypertension, Diabetes, Current Smoker and Dyslipidemia.  Sonographer:    Lucendia Herrlich Referring Phys: [id number] [facility] KHALIQDINA IMPRESSIONS  1. Left ventricular ejection fraction, by estimation, is 70 to 75%. The left ventricle has hyperdynamic function. The left ventricle has no regional wall motion abnormalities. There is moderate concentric left ventricular hypertrophy. Left ventricular diastolic parameters are consistent with Grade II diastolic dysfunction (pseudonormalization).  2. Right ventricular systolic function is normal. The right ventricular size is normal. There is normal pulmonary artery systolic pressure.  3. The mitral valve is degenerative. Mild to moderate mitral valve regurgitation. Moderate mitral annular calcification.  4. The aortic valve is abnormal. Aortic valve regurgitation is mild to moderate. Mild aortic valve stenosis.  5. The inferior vena cava is normal in size with greater than 50% respiratory variability, suggesting right atrial pressure of 3 mmHg. Comparison(s): Prior images reviewed side by side. Aortic valve is not well visualized in patients past studies or recent CT. Aortic regurgitation not seen in prior study. FINDINGS  Left Ventricle: Left ventricular ejection fraction, by estimation, is 70 to 75%. The left ventricle has hyperdynamic function. The left ventricle has no regional wall motion abnormalities. The left ventricular internal cavity size was normal in size. There is moderate concentric left ventricular hypertrophy. Left ventricular diastolic parameters are consistent with Grade II diastolic dysfunction (pseudonormalization). Right Ventricle: The right ventricular size is normal. No increase in right ventricular wall thickness. Right ventricular systolic function is normal. There is normal pulmonary artery systolic pressure. The tricuspid regurgitant velocity is 2.58 m/s, and  with an assumed right atrial pressure of 3 mmHg, the estimated right  ventricular systolic pressure is 29.6 mmHg. Left Atrium: Left atrial size was normal in size. Right Atrium: Right atrial size was normal in size. Pericardium: There is no evidence of pericardial effusion. Mitral Valve: The mitral valve is degenerative in appearance. Moderate mitral annular calcification. Mild to moderate mitral valve regurgitation. Tricuspid Valve: The tricuspid valve is normal in structure. Tricuspid valve regurgitation is trivial. No evidence of tricuspid stenosis. Aortic Valve: The aortic valve is abnormal. Aortic valve regurgitation is mild to moderate. Mild aortic stenosis is present. Aortic valve mean gradient measures 17.0 mmHg. Aortic valve peak gradient measures 29.9 mmHg. Aortic valve area, by VTI measures 1.93 cm. Pulmonic Valve: The pulmonic valve was not well visualized. Pulmonic valve regurgitation is mild to moderate. No evidence of pulmonic stenosis. Aorta: The aortic root and ascending aorta are structurally normal, with no evidence of dilitation. Venous: The inferior vena cava is normal in size with greater than 50% respiratory variability, suggesting right atrial pressure of 3 mmHg. IAS/Shunts: The interatrial septum appears to be lipomatous. No atrial level shunt detected by color flow Doppler. Additional Comments: A device lead is visualized.  LEFT VENTRICLE PLAX 2D LVIDd:         4.40 cm   Diastology LVIDs:         2.40 cm   LV e' medial:    5.22 cm/s LV PW:         1.10 cm   LV E/e' medial:  28.5 LV IVS:        1.30 cm   LV e' lateral:   7.51 cm/s LVOT diam:     2.00 cm   LV E/e' lateral: 19.8 LV SV:         101 LV SV  Index:   60 LVOT Area:     3.14 cm                           3D Volume EF:                          3D EF:        62 %                          LV EDV:       138 ml                          LV ESV:       52 ml                          LV SV:        86 ml RIGHT VENTRICLE             IVC RV S prime:     13.10 cm/s  IVC diam: 1.25 cm TAPSE (M-mode): 2.2 cm LEFT  ATRIUM             Index        RIGHT ATRIUM          Index LA diam:        3.70 cm 2.21 cm/m   RA Area:     9.49 cm LA Vol (A2C):   34.7 ml 20.72 ml/m  RA Volume:   17.00 ml 10.15 ml/m LA Vol (A4C):   62.1 ml 37.08 ml/m LA Biplane Vol: 49.1 ml 29.32 ml/m  AORTIC VALVE AV Area (Vmax):    1.92 cm AV Area (Vmean):   1.89 cm AV Area (VTI):     1.93 cm AV Vmax:           273.33 cm/s AV Vmean:          192.000 cm/s AV VTI:            0.524 m AV Peak Grad:      29.9 mmHg AV Mean Grad:      17.0 mmHg LVOT Vmax:         167.00 cm/s LVOT Vmean:        115.667 cm/s LVOT VTI:          0.322 m LVOT/AV VTI ratio: 0.61  AORTA Ao Root diam: 2.90 cm Ao Asc diam:  2.70 cm MITRAL VALVE                TRICUSPID VALVE MV Area (PHT): 4.89 cm     TR Peak grad:   26.6 mmHg MV Decel Time: 155 msec     TR Vmax:        258.00 cm/s MR Peak grad: 101.2 mmHg MR Mean grad: 84.0 mmHg     SHUNTS MR Vmax:      503.00 cm/s   Systemic VTI:  0.32 m MR Vmean:     436.0 cm/s    Systemic Diam: 2.00 cm MV E velocity: 149.00 cm/s MV A velocity: 118.00 cm/s MV E/A ratio:  1.26 Riley Lam MD Electronically signed by Riley Lam MD Signature Date/Time: 02/19/2022/11:36:45 AM    Final    DG Abd Portable 1V  Result Date: 02/19/2022 CLINICAL DATA:  Check gastric catheter  placement EXAM: PORTABLE ABDOMEN - 1 VIEW COMPARISON:  None Available. FINDINGS: Scattered large and small bowel gas is noted. Aortic stent graft is noted. Renal excretion of contrast is noted related to prior vascular procedure. Gastric catheter is noted with the tip in the stomach. Proximal side port however lies at the gastroesophageal junction. This should be advanced deeper into the stomach. IMPRESSION: Gastric catheter is described which should be advanced deeper into the stomach. Electronically Signed   By: Alcide Clever M.D.   On: 02/19/2022 00:36   CT CEREBRAL PERFUSION W CONTRAST  Result Date: 02/18/2022 CLINICAL DATA:  Left facial droop EXAM: CT  PERFUSION BRAIN TECHNIQUE: Multiphase CT imaging of the brain was performed following IV bolus contrast injection. Subsequent parametric perfusion maps were calculated using RAPID software. RADIATION DOSE REDUCTION: This exam was performed according to the departmental dose-optimization program which includes automated exposure control, adjustment of the mA and/or kV according to patient size and/or use of iterative reconstruction technique. CONTRAST:  40mL OMNIPAQUE IOHEXOL 350 MG/ML SOLN COMPARISON:  None Available. FINDINGS: CT Brain Perfusion Findings: CBF (<30%) Volume: 32mL Perfusion (Tmax>6.0s) volume: 37mL Mismatch Volume: 5mL ASPECTS on noncontrast CT Head: 10 at 7:47 p.m. today. Infarct Core: 32 mL Infarction Location:Anterior right MCA territory IMPRESSION: A 32 mL core infarct with 5 mL penumbra. Electronically Signed   By: Deatra Robinson M.D.   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 EXAM: CT ANGIOGRAPHY CHEST, ABDOMEN AND PELVIS TECHNIQUE: Non-contrast CT of the chest was initially obtained. Multidetector CT imaging through the chest, abdomen and pelvis was performed using the standard protocol during bolus administration of intravenous contrast. Multiplanar reconstructed images and MIPs were obtained and reviewed to evaluate the vascular anatomy. RADIATION DOSE REDUCTION: This exam was performed according to the departmental dose-optimization program which includes automated exposure control, adjustment of the mA and/or kV according to patient size and/or use of iterative reconstruction technique. CONTRAST:  75mL OMNIPAQUE IOHEXOL 350 MG/ML SOLN COMPARISON:  December 15, 2021 FINDINGS: CTA CHEST FINDINGS Cardiovascular: A dual lead AICD is noted. There is marked severity calcification of the thoracic aorta. The distal aspect of the descending thoracic aorta is tortuous in  appearance and measures approximately 3.1 cm x 2.8 cm. Satisfactory opacification of the pulmonary arteries to the segmental level. No evidence of pulmonary embolism. Normal heart size with marked severity coronary artery calcification. No pericardial effusion. Mediastinum/Nodes: No enlarged mediastinal, hilar, or axillary lymph nodes. Thyroid gland, trachea, and esophagus demonstrate no significant findings. Lungs/Pleura: Mild, bilateral atelectatic changes are seen without evidence of acute infiltrate, pleural effusion or pneumothorax. Musculoskeletal: No chest wall abnormality. No acute or significant osseous findings. Review of the MIP images confirms the above findings. CTA ABDOMEN AND PELVIS FINDINGS VASCULAR Aorta: Patent aortoiliac stent graft, without evidence of stent occlusion or stent leak. Celiac: Marked severity calcification without evidence of aneurysm, dissection, vasculitis or significant stenosis. SMA: Marked severity calcification without evidence of aneurysm, dissection, vasculitis or significant stenosis. Renals: Both renal arteries are patent without evidence of aneurysm, dissection, vasculitis, fibromuscular dysplasia or significant stenosis. IMA: Patent without evidence of aneurysm, dissection, vasculitis or significant stenosis. 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 Veins: No obvious venous abnormality within the limitations of this arterial phase study. Review of the  MIP images confirms the above findings. NON-VASCULAR Hepatobiliary: No focal liver abnormality is seen. No gallstones or gallbladder wall thickening. The common bile duct measures 11.6 mm in diameter. Pancreas: There is mild pancreatic ductal dilatation without evidence of surrounding inflammatory changes. Spleen: A 3.1 cm x 2.1 cm cystic appearing area is seen within the anterior  aspect of the spleen. Adrenals/Urinary Tract: There is diffuse bilateral adrenal gland enlargement, left greater than right. Kidneys are normal in size, without renal calculi or hydronephrosis. An 8 mm diameter simple cyst is seen within the anterolateral aspect of the mid left kidney. Bladder is unremarkable. A 4.8 cm x 4.0 cm cystic appearing structure is seen adjacent to the anterolateral aspect of the urinary bladder on the left. This represents a new finding when compared to the prior study. Stomach/Bowel: Stomach is within normal limits. Appendix appears normal. No evidence of bowel wall thickening, distention, or inflammatory changes. Noninflamed diverticula are seen throughout the descending and sigmoid colon. Lymphatic: No abnormal abdominal or pelvic lymph nodes are identified. Reproductive: Status post hysterectomy. Other: A 2.1 cm x 1.4 cm fat containing ventral hernia is noted along the mid abdomen. No abdominopelvic ascites. Musculoskeletal: Multilevel degenerative changes seen throughout the lumbar spine. Review of the MIP images confirms the above findings. IMPRESSION: 1. No evidence of pulmonary embolism or other acute intrathoracic process. 2. Patent aortoiliac stent graft without evidence of stent occlusion or stent leak. 3. 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 4. Colonic diverticulosis. 5. Cystic appearing area adjacent to the anterolateral aspect of the urinary bladder on the left, which represents a new finding when compared to the prior study. This may represent a large bladder diverticulum. 6. Stable splenic cyst. 7. Small fat containing ventral hernia along the mid abdomen. 8. Aortic atherosclerosis. Aortic Atherosclerosis (ICD10-I70.0). Electronically Signed   By: Aram Candela M.D.   On: 02/18/2022 20:25   CT HEAD CODE STROKE WO CONTRAST  Result  Date: 02/18/2022 CLINICAL DATA:  Left facial droop and right gaze deviation EXAM: CT HEAD WITHOUT CONTRAST CT ANGIOGRAPHY OF THE HEAD AND NECK TECHNIQUE: Contiguous axial images were obtained from the base of the skull through the vertex without intravenous contrast. Multidetector CT imaging of the head and neck was performed using the standard protocol during bolus administration of intravenous contrast. Multiplanar CT image reconstructions and MIPs were obtained to evaluate the vascular anatomy. Carotid stenosis measurements (when applicable) are obtained utilizing NASCET criteria, using the distal internal carotid diameter as the denominator. RADIATION DOSE REDUCTION: This exam was performed according to the departmental dose-optimization program which includes automated exposure control, adjustment of the mA and/or kV according to patient size and/or use of iterative reconstruction technique. CONTRAST:  51mL OMNIPAQUE IOHEXOL 350 MG/ML SOLN COMPARISON:  12/09/2021 head CT FINDINGS: CT HEAD Brain: No acute intracranial hemorrhage. 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 There is periventricular hypoattenuation compatible with chronic microvascular disease. Generalized volume loss. Vascular: Atherosclerosis of the internal carotid arteries at the skull base. Skull: Normal. Sinuses/Orbits: Bilateral posterior ethmoid air cell mucosal thickening. Orbits are normal. ASPECTS ([place]ber[place] Stroke Program Early CT Score) - Ganglionic level infarction (caudate, lentiform nuclei, internal capsule, insula, M1-M3 cortex): 7 - Supraganglionic infarction (M4-M6 cortex): 3 Total score (0-10 with 10 being normal): 10 Review of the MIP images confirms the above findings CTA NECK Aortic arch: Aortic atherosclerosis.  Normal  branching pattern. Right carotid system: Atherosclerosis throughout the course of the right common carotid artery. The bifurcation is widely patent. No hemodynamically significant stenosis of the  right ICA. Left carotid system: Common carotid atherosclerosis without stenosis. Atherosclerotic calcification at the bifurcation extending into the ICA resulting in 60% stenosis. The distal ICA is widely patent. Vertebral arteries:Left dominant. Multifocal atherosclerosis along the course of both vertebral arteries resulting in mild stenosis of the left mid V2 segment head multifocal severe stenosis of the right P2 segment. Both vertebral arteries remain patent to the skull base. Skeleton: Negative Other neck: Unremarkable. Upper chest: Clear CTA HEAD Anterior circulation: There is calcific atherosclerosis of both internal carotid arteries at the skull base with mild stenosis bilaterally. The anterior cerebral arteries are normal. The right MCA is patent proximally in the M1 segment is normal. There is an early right bifurcation. There is occlusion of a M2 superior division branch in the anterior sylvian fissure (series 8, image 109). The left MCA is normal. Posterior circulation: Both P comms are patent. There is atherosclerotic irregularity of the basilar artery with moderate stenosis of the midportion. The diminutive right vertebral artery terminates in PICA. Both PCAs are patent. There is mild atherosclerotic irregularity bilaterally. Venous sinuses: Normal allowing for venous dominant contrast phase. Anatomic variants: Bilateral fetal origins of the posterior cerebral arteries. Delayed phase: None IMPRESSION: 1. Occlusion of a right M2 superior division branch in the anterior Sylvian fissure. No other intracranial occlusion. 2. No acute intracranial hemorrhage. ASPECTS is 10. 3. 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 4. 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 5. 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 Aortic Atherosclerosis (ICD10-I70.0). These results were called by telephone at the time of interpretation on 02/18/2022 at 8:10 pm to provider  [place] , who verbally acknowledged these results. Electronically Signed   By: Deatra Robinson M.D.   On: 02/18/2022 20:23   CT ANGIO HEAD NECK W WO CM (CODE STROKE)  Result Date: 02/18/2022 CLINICAL DATA:  Left facial droop and right gaze deviation EXAM: CT HEAD WITHOUT CONTRAST CT ANGIOGRAPHY OF THE HEAD AND NECK TECHNIQUE: Contiguous axial images were obtained from the base of the skull through the vertex without intravenous contrast. Multidetector CT imaging of the head and neck was performed using the standard protocol during bolus administration of intravenous contrast. Multiplanar CT image reconstructions and MIPs were obtained to evaluate the vascular anatomy. Carotid stenosis measurements (when applicable) are obtained utilizing NASCET criteria, using the distal internal carotid diameter as the denominator. RADIATION DOSE REDUCTION: This exam was performed according to the departmental dose-optimization program which includes automated exposure control, adjustment of the mA and/or kV according to patient size and/or use of iterative reconstruction technique. CONTRAST:  48mL OMNIPAQUE IOHEXOL 350 MG/ML SOLN COMPARISON:  12/09/2021 head CT FINDINGS: CT HEAD Brain: No acute intracranial hemorrhage. 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 There is periventricular hypoattenuation compatible with chronic microvascular disease. Generalized volume loss. Vascular: Atherosclerosis of the internal carotid arteries at the skull base. Skull: Normal. Sinuses/Orbits: Bilateral posterior ethmoid air cell mucosal thickening. Orbits are normal. ASPECTS (Alberta Stroke Program Early CT Score) - Ganglionic level infarction (caudate, lentiform nuclei, internal capsule, insula, M1-M3 cortex): 7 - Supraganglionic infarction (M4-M6 cortex): 3 Total score (0-10 with 10 being normal): 10 Review of the MIP images confirms the above findings CTA NECK Aortic arch: Aortic atherosclerosis.  Normal branching  pattern. Right carotid  system: Atherosclerosis throughout the course of the right common carotid artery. The bifurcation is widely patent. No hemodynamically significant stenosis of the right ICA. Left carotid system: Common carotid atherosclerosis without stenosis. Atherosclerotic calcification at the bifurcation extending into the ICA resulting in 60% stenosis. The distal ICA is widely patent. Vertebral arteries:Left dominant. Multifocal atherosclerosis along the course of both vertebral arteries resulting in mild stenosis of the left mid V2 segment head multifocal severe stenosis of the right P2 segment. Both vertebral arteries remain patent to the skull base. Skeleton: Negative Other neck: Unremarkable. Upper chest: Clear CTA HEAD Anterior circulation: There is calcific atherosclerosis of both internal carotid arteries at the skull base with mild stenosis bilaterally. The anterior cerebral arteries are normal. The right MCA is patent proximally in the M1 segment is normal. There is an early right bifurcation. There is occlusion of a M2 superior division branch in the anterior sylvian fissure (series 8, image 109). The left MCA is normal. Posterior circulation: Both P comms are patent. There is atherosclerotic irregularity of the basilar artery with moderate stenosis of the midportion. The diminutive right vertebral artery terminates in PICA. Both PCAs are patent. There is mild atherosclerotic irregularity bilaterally. Venous sinuses: Normal allowing for venous dominant contrast phase. Anatomic variants: Bilateral fetal origins of the posterior cerebral arteries. Delayed phase: None IMPRESSION: 1. Occlusion of a right M2 superior division branch in the anterior Sylvian fissure. No other intracranial occlusion. 2. No acute intracranial hemorrhage. ASPECTS is 10. 3. 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 4. 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 5. Moderate  stenosis of the midportion of the basilar artery. Aortic Atherosclerosis (ICD10-I70.0). These results were called by telephone at the time of interpretation on 02/18/2022 at 8:10 pm to provider [facility] , who verbally acknowledged these results. Electronically Signed   By: Deatra RobinsonKevin  Herman M.D.   On: 02/18/2022 20:23      HISTORY OF PRESENT ILLNESS Romie JumperVinnie M Mayor is a 77 y.o. 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   Patient lives in a group home and I spoke to caretaker to get history.  Patient and caretaker were discussing options for dinner at 1800.  Caretaker left an 1815 and walked back to see patient slumped over and not talking.  She was worried about a stroke and called EMS.   On arrival, EMS noted left-sided weakness, left facial droop, aphasia and right gaze deviation and activated code stroke.   Patient's caretaker reports that over the last couple days, patient has been reporting some generalized weakness.  She was able to walk with a walker today around noon and had a sandwich.   Since her discharge from the hospital last month, patient has been requiring assistance with dressing and undressing herself, bathing, meal preparation, finances.  Patient is able to usually get up and walk short distances with a walker and communicate and watches TV.   LKW: 1815 on 02/18/2022. mRS: 4 tNKASE: tnkase was not offered immediately given labs demonstrated downtrending platelets with last platelet count of 96 in early nov 2023 and patient noted to have a widened pulse pressure with significant history of common iliac aneurysms and penetrating aortic atherosclerotic ulcers. Initially discussed with  patient's niece Ms. Cordelia PenSherry  buie about tnkase pending platelet count and CT dissection study. However, platelet count resulted at 74 and thus tnkase was not offered. Thrombectomy: Yes, offered. CT angio demonstrated occlusion of right M2 superior division. Felt that the M2 occlusion may not explain a high NIHSS of 18. CT perfusion obtained which demonstrated 32ml core with 5ml of penumbra. Dr. Nyra Jabs discussed details, risks and benefits of thrombectomy along with alternatives with patient's niece Ms. Sherri [phone]) over phone and Ms. Buie consented to thrombectomy. Delay to thrombectomy: complexity of her presentation, requiring a 18g IV to get CT perfusion, evaluating for dissection and waiting for platelet count to result ended up causing significant delay to making tnkase decision and delaying IR activation.   HOSPITAL COURSE Ms. ALESIA OSHIELDS is a 77 y.o. 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  TNK was not offered due to low platelet count, and mechanical thrombectomy was performed.  TICI2B flow was achieved in the right MCA, however procedure was complicated by vessel rupture requiring vessel sacrifice with coil embolization.  Patient remained intubated after procedure and was transferred to the ICU.   Pt now extubated on room air but likely aspirating, requiring regular suctioning.  Also UTI(+) with Rocephin.  Will have pacemaker interrogated to look for possible afib as cause for stroke.   Transferring out of ICU.  Will monitor for improvement and transfer to SNF.  If condition begins to worsen we can discuss Comfort Care as pt had a one way extubation and is now DNR.   Pt condition gradually  decline with fever, leukocytosis, copious secretions, aspiration and decreased consciousness. Discussed with POA nephew Zollie Beckers, proceeded with comfort care.    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  Multiple old infarcts.   CTA head & neck occlusion of right M2 superior division branch, 60% stenosis of left ICA, severe stenosis of right V2 segment, moderate stenosis of basilar artery MRI large acute right MCA territory infarct, as well as numerous smaller infarcts in bilateral cerebral and cerebellar hemispheres, subarachnoid hemorrhage at right sylvian fissure, numerous chronic microhemorrhages suspicious for CAA Repeat CT scan 02/21/22 showed evolving right MCA infarct with small hyperdensity in the sylvian fissure. 2D Echo EF 70 to 75%, normal left atrial size, no atrial level shunt Pacemaker interrogation  LDL 31 HgbA1c 6.4 VTE prophylaxis -Lovenox aspirin 81 mg daily and clopidogrel 75 mg daily prior to admission, on no antithrombotics for comfort care Was on morphine drip for comfort care, will give morphine bolus prior to transport.   Respiratory failure Aspiration pneumonia Fever  Leukocytosis  Remain intubated post IR One way extubation 02/21/22, on [place] Copious secretion with intermittent SOB Tmax 100.5->102.9->103->103.1->102 WBC 8.8->10.7 -> 11.0 Was on rocephin before comfort care   UTI Fever 103.4->102 UA WBC > 50 Was treated with rocephin   Carotid stenosis S/p right CEA in the past CTA head and neck - Left ICA 60% Was on ASA, now in comfort care   Hypertension Home meds: Norvasc 2.5 mg daily Was on norvasc 2.5mg  and metoprolol 25mg  bid Now off BP meds for comfort care  Hyperlipidemia Home meds: Rosuvastatin 40 mg daily, resumed in hospital LDL 31,  goal < 70   Dysphagia  NPO Was on TF Now d/c NG tube for comfort    Other Stroke Risk Factors Advanced Age >/= 109  Former cigarette smoker Hx stroke Coronary artery disease 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   Other Active Problems Thrombocytopenia platelet 86->101->143 Hypernatremia Na 149 -> 150 AVB on pacer Kidney mass   DISCHARGE EXAM Blood pressure (!) 118/34, pulse 100, temperature 98.9 F (37.2 C), temperature source Oral, resp. rate 18, weight 64 kg, SpO2 98 %.  General: mild acute distress due to tachypnea, drowsy sleepy Respiratory: labored breathing from time to time with deep secretions  Neuro: limited exam due to comfort care measures. Pt slightly open eyes on voice, but not able to keep eyes open. Mild tachypnea. Nonverbal, not following commands. Left facial droop. No spontaneous movement.     Discharge Diet       Diet   Diet NPO time specified   liquids  DISCHARGE PLAN Disposition:  residential hospice  Continue comfort care measures.    30 minutes were spent preparing discharge.  Rosalin Hawking, MD PhD Stroke Neurology 02/27/2022 12:31 PM

## 2022-02-27 NOTE — Plan of Care (Signed)
  Problem: Pain Managment: Goal: General experience of comfort will improve Outcome: Progressing   Problem: Safety: Goal: Ability to remain free from injury will improve Outcome: Progressing   Problem: Skin Integrity: Goal: Risk for impaired skin integrity will decrease Outcome: Progressing   

## 2022-03-05 DIAGNOSIS — I714 Abdominal aortic aneurysm, without rupture, unspecified: Secondary | ICD-10-CM | POA: Insufficient documentation

## 2022-03-05 NOTE — Progress Notes (Deleted)
MRN : [id number]  Shannon Chen is a 77 y.o. (1945-11-20) female who presents with chief complaint of check circulation.  History of Present Illness:    She presented approximately 1 month ago to [facility] with fairly abrupt onset of weakness of the lower extremities and loss of sensation.  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  Diffuse atherosclerotic changes were also mentioned.   Patient denies unusual abdominal pain or unusual back pain, no other abdominal complaints.  No history of an abrupt onset of a painful toe associated with blue discoloration.      No family history of AAA.    Patient denies amaurosis fugax or TIA symptoms. There is no history of claudication or rest pain symptoms of the lower extremities.  The patient denies angina or shortness of breath.   CT scan is reviewed by me and I concur with the left common iliac artery aneurysm is now 3 cm to 3.2 cm in diameter.  The right common iliac artery aneurysm is 2.7 cm.  The abdominal aorta is 25 mm in diameter.  Of significance when I compared the CT scan to previous scans dated January 2022 and November 2022 there has been rapid enlargement of the left common iliac artery aneurysm.  On the previous scans it was noted to be 2.0 cm and 2.4 cm respectively.   The patient underwent EVAR of the abdominal aortic aneurysm on 12/18/2021, showed patent graft with no evidence of endoleak.  No outpatient medications have been marked as taking for the 03/06/22 encounter (Appointment) with Delana Meyer, Dolores Lory, MD.    Past Medical History:  Diagnosis Date   Arthritis    AV block,  Mobitz II    a. 09/2016 s/p MDT [id number] Azure XT DR MRI DC PPM   CAD (coronary artery disease)    a. 11/2020 NSTEMI/Cath: LM mild diff dzs, LAD, diff dzs, LCX 30d, RCA Ca2+ w/ diff dzs throughout, 47m FFR cath not able to advance-->Med Rx.   Carotid artery disease ([place]    a. 09/2016 Carotid U/S: RICA [id number] LICA 50000000  b. [id number] Neck: RICA 60, RCCA 30, LICA [id number] L Vert 60, L Basilar 70; c. 10/2019 s/p R CEA.   Essential hypertension    History of stress test    a. 12/2016 MV: EF 51%, no ischemia/infarct; b. 10/2019 MV: EF 61%, no ischemia/infarct.   Hyperlipidemia    Hypertrophic cardiomyopathy ([place]    a. 09/2016 Echo: EF 60-65%, no rwma, Gr1 DD, Ca2+ MV annulus; b. 11/2017 Echo: EF 70-75%, no rwma, Gr1 DD, mild AS vs dynamic LVOT obs. Nl RV fxn. Hyperdynamic LVEF w/ sev LVH; c. 10/2019 Echo: EF 70-75%, no rwma, gr1 DD, sev LVH, Gr1 DD, nl PASP, triv MR, mild-mod Ao sclerosis; c. 05/2020 Echo: EF 60-65%; e. 11/2020 Echo: EF 60-65%, sev LVH, nl RV fxn, Mild MR. Ao Sclerosis.   Presence of permanent cardiac pacemaker     Past Surgical History:  Procedure Laterality Date   ABDOMINAL HYSTERECTOMY     CORONARY STENT INTERVENTION N/A 12/31/2020   Procedure: CORONARY STENT INTERVENTION;  Surgeon:  Wellington Hampshire, MD;  Location: [place] [facility];  Service: Cardiovascular;  Laterality: N/A;   ENDARTERECTOMY Right 11/16/2019   Procedure: ENDARTERECTOMY CAROTID;  Surgeon: Katha Cabal, MD;  Location: [facility];  Service: Vascular;  Laterality: Right;   ENDOVASCULAR REPAIR/STENT GRAFT N/A 12/18/2021   Procedure: ENDOVASCULAR REPAIR/STENT GRAFT;  Surgeon: Katha Cabal, MD;  Location: [place] [facility];  Service: Cardiovascular;  Laterality: N/A;   INTRAVASCULAR PRESSURE WIRE/FFR STUDY N/A 12/13/2020   Procedure: INTRAVASCULAR PRESSURE WIRE/FFR STUDY;  Surgeon: Leonie Man, MD;  Location: [place] [facility];  Service: Cardiovascular;  Laterality: N/A;   IR ANGIOGRAM FOLLOW UP  STUDY  02/18/2022   IR ANGIOGRAM FOLLOW UP STUDY  02/18/2022   IR ANGIOGRAM FOLLOW UP STUDY  02/18/2022   IR ANGIOGRAM FOLLOW UP STUDY  02/18/2022   IR CT HEAD LTD  02/21/2022   IR PERCUTANEOUS ART THROMBECTOMY/INFUSION INTRACRANIAL INC DIAG ANGIO  02/18/2022   IR US GUIDE VASC ACCESS RIGHT  02/18/2022   LEFT HEART CATH AND CORONARY ANGIOGRAPHY N/A 12/13/2020   Procedure: LEFT HEART CATH AND CORONARY ANGIOGRAPHY;  Surgeon: Leonie Man, MD;  Location: [place] [facility];  Service: Cardiovascular;  Laterality: N/A;   LEFT HEART CATH AND CORONARY ANGIOGRAPHY N/A 12/31/2020   Procedure: LEFT HEART CATH AND CORONARY ANGIOGRAPHY;  Surgeon: Wellington Hampshire, MD;  Location: [place] [facility];  Service: Cardiovascular;  Laterality: N/A;   MUSCLE BIOPSY Left 12/16/2021   Procedure: MUSCLE BIOPSY;  Surgeon: Ronny Bacon, MD;  Location: [facility];  Service: General;  Laterality: Left;   PACEMAKER IMPLANT N/A 10/08/2016   Procedure: Pacemaker Implant;  Surgeon: Evans Lance, MD;  Location: [place] [facility];  Service: Cardiovascular;  Laterality: N/A;   RADIOLOGY WITH ANESTHESIA N/A 02/18/2022   Procedure: IR WITH ANESTHESIA;  Surgeon: Luanne Bras, MD;  Location: [place];  Service: Radiology;  Laterality: N/A;    Social History Social History   Tobacco Use   Smoking status: Former    Packs/day: 1.00    Years: 30.00    Total pack years: 30.00    Types: Cigarettes    Quit date: 11/09/2018    Years since quitting: 3.3    Passive exposure: Past   Smokeless tobacco: Never  Vaping Use   Vaping Use: Never used  Substance Use Topics   Alcohol use: No   Drug use: No    Family History Family History  Problem Relation Age of Onset   Diabetes Mother    CAD Father     No Known Allergies   REVIEW OF SYSTEMS (Negative unless checked)  Constitutional: []$ Weight loss  []$ Fever  []$ Chills Cardiac: []$ Chest pain   []$ Chest pressure   []$ Palpitations   []$ Shortness of breath when laying flat    []$ Shortness of breath with exertion. Vascular:  [x]$ Pain in legs with walking   []$ Pain in legs at rest  []$ History of DVT   []$ Phlebitis   []$ Swelling in legs   []$ Varicose veins   []$ Non-healing ulcers Pulmonary:   []$ Uses home oxygen   []$ Productive cough   []$ Hemoptysis   []$ Wheeze  []$ COPD   []$ Asthma Neurologic:  []$ Dizziness   []$ Seizures   []$ History of stroke   []$ History of TIA  []$ Aphasia   []$ Vissual changes   []$ Weakness or numbness in arm   []$ Weakness or numbness in leg Musculoskeletal:   []$ Joint swelling   []$ Joint pain   []$ Low back pain Hematologic:  []$ Easy bruising  []$ Easy bleeding   []$   Hypercoagulable state   []$ Anemic Gastrointestinal:  []$ Diarrhea   []$ Vomiting  []$ Gastroesophageal reflux/heartburn   []$ Difficulty swallowing. Genitourinary:  []$ Chronic kidney disease   []$ Difficult urination  []$ Frequent urination   []$ Blood in urine Skin:  []$ Rashes   []$ Ulcers  Psychological:  []$ History of anxiety   []$  History of major depression.  Physical Examination  There were no vitals filed for this visit. There is no height or weight on file to calculate BMI. Gen: WD/WN, NAD Head: [place]/AT, No temporalis wasting.  Ear/Nose/Throat: Hearing grossly intact, nares w/o erythema or drainage Eyes: PER, EOMI, sclera nonicteric.  Neck: Supple, no masses.  No bruit or JVD.  Pulmonary:  Good air movement, no audible wheezing, no use of accessory muscles.  Cardiac: RRR, normal S1, S2, no Murmurs. Vascular:  mild trophic changes, no open wounds Vessel Right Left  Radial Palpable Palpable  PT Not Palpable Not Palpable  DP Not Palpable Not Palpable  Gastrointestinal: soft, non-distended. No guarding/no peritoneal signs.  Musculoskeletal: M/S 5/5 throughout.  No visible deformity.  Neurologic: CN 2-12 intact. Pain and light touch intact in extremities.  Symmetrical.  Speech is fluent. Motor exam as listed above. Psychiatric: Judgment intact, Mood & affect appropriate for pt's clinical situation. Dermatologic: No rashes or  ulcers noted.  No changes consistent with cellulitis.   CBC Lab Results  Component Value Date   WBC 11.0 (H) 02/25/2022   HGB 8.5 (L) 02/25/2022   HCT 28.9 (L) 02/25/2022   MCV 92.3 02/25/2022   PLT 214 02/25/2022    BMET    Component Value Date/Time   NA 150 (H) 02/25/2022 0429   K 4.4 02/25/2022 0429   CL 118 (H) 02/25/2022 0429   CO2 21 (L) 02/25/2022 0429   GLUCOSE 196 (H) 02/25/2022 0429   BUN 35 (H) 02/25/2022 0429   CREATININE 0.87 02/25/2022 0429   CALCIUM 8.1 (L) 02/25/2022 0429   GFRNONAA >60 02/25/2022 0429   GFRAA >60 11/18/2019 [id number]   Estimated Creatinine Clearance: 49 mL/min (by C-G formula based on SCr of 0.87 mg/dL).  COAG Lab Results  Component Value Date   INR 1.3 (H) 02/18/2022   INR 1.1 12/29/2020   INR 1.0 12/28/2020    Radiology DG CHEST PORT 1 VIEW  Result Date: 02/24/2022 CLINICAL DATA:  Tachypnea. EXAM: PORTABLE CHEST 1 VIEW COMPARISON:  02/22/2022 and older studies. FINDINGS: Cardiac silhouette mildly enlarged.  No mediastinal or hilar masses. Clear lungs. Nasal/orogastric tube is stable, tip in the distal stomach. Stable left anterior chest wall dual lead pacemaker. IMPRESSION: 1. No acute cardiopulmonary disease. No change from the most recent prior study. Electronically Signed   By: Lajean Manes M.D.   On: 02/24/2022 21:17   DG CHEST PORT 1 VIEW  Result Date: 02/22/2022 CLINICAL DATA:  Fever, short of breath EXAM: PORTABLE CHEST 1 VIEW COMPARISON:  CT scan of the chest 02/18/2022; abdominal radiograph yesterday FINDINGS: Gastric tube remains present. Left subclavian approach cardiac rhythm maintenance device with leads projecting over the right atrium and right ventricle. Atherosclerotic calcifications present in the transverse aorta. Stable cardiomegaly. Nonspecific mild patchy airspace opacity in the left lung base. Trace blunting of the left costophrenic angle. Small effusion suspected. No pulmonary edema. No pneumothorax. No acute osseous  abnormality. IMPRESSION: Mild patchy/streaky airspace opacity in the left lung base may reflect atelectasis or infiltrate. Suspect small left pleural effusion. Electronically Signed   By: Jacqulynn Cadet M.D.   On: 02/22/2022 08:06   DG Abd Portable 1V  Result Date:  02/21/2022 CLINICAL DATA:  Nasogastric tube placement EXAM: PORTABLE ABDOMEN - 1 VIEW COMPARISON:  02/21/2022 FINDINGS: Since the prior examination, the nasogastric tube is been advanced and its tip is now seen in the expected gastric antrum. Visualized abdominal gas pattern is nonobstructive. No gross free intraperitoneal gas within the visualized abdomen. Visualized lung bases are clear. Cardiac size within normal limits. Pacemaker leads noted within the right heart. Aortic stent graft is partially visualized. IMPRESSION: 1. Nasogastric tube tip advanced, now within the gastric antrum. Electronically Signed   By: Fidela Salisbury M.D.   On: 02/21/2022 19:09   DG Abd Portable 1V  Result Date: 02/21/2022 CLINICAL DATA:  Orogastric tube placement. EXAM: PORTABLE ABDOMEN - 1 VIEW COMPARISON:  Radiograph 02/19/2022 FINDINGS: Tip of the enteric tube is below the diaphragm in the stomach. The side-port is in the region of the gastroesophageal junction. Nonspecific but nonobstructive upper abdominal bowel gas pattern. Aorto bi-iliac stent graft in place. IMPRESSION: Tip of the enteric tube below the diaphragm in the stomach. The side-port is in the region of the gastroesophageal junction. Advancement of at least 3 cm would place the side-port below the diaphragm. Electronically Signed   By: Keith Rake M.D.   On: 02/21/2022 17:44   CT HEAD WO CONTRAST (5MM)  Result Date: 02/21/2022 CLINICAL DATA:  Stroke, follow up EXAM: CT HEAD WITHOUT CONTRAST TECHNIQUE: Contiguous axial images were obtained from the base of the skull through the vertex without intravenous contrast. RADIATION DOSE REDUCTION: This exam was performed according to the departmental  dose-optimization program which includes automated exposure control, adjustment of the mA and/or kV according to patient size and/or use of iterative reconstruction technique. COMPARISON:  Flat panel CT from IR procedure 02/19/2022. MRI head from 02/19/2022. FINDINGS: Brain: Small amount of hyperdensity within the right sylvian fissure and overlying frontal convexity appears decreased relative to flat panel CT from 02/19/2022 interventional procedure. Evolving infarct in the right MCA distribution, better characterized on prior MRI. Embolization coils in this region. No hydrocephalus. No mass lesion. Vascular: See above. Skull: No acute fracture. Sinuses/Orbits: No acute findings. Other: No mastoid effusions. IMPRESSION: 1. 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 Therefore, this is favored to largely represent contrast staining; however, a small amount of subarachnoid hemorrhage is probable given appearance on recent MRI. Heidelberg bleeding classification 3c. 2. No progressive mass effect. Electronically Signed   By: Margaretha Sheffield M.D.   On: 02/21/2022 09:01   IR PERCUTANEOUS ART THROMBECTOMY/INFUSION INTRACRANIAL INC DIAG ANGIO  Result Date: 02/20/2022 INDICATION: 77 year old female presenting with sudden onset left-sided weakness, aphasia and right gaze deviation; NIHSS 22 (later improved to 18). 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 Head CT showed a large acute territory infarct or hemorrhage. CT angiogram of the head  and neck showed a right M2/MCA occlusion. He was taken to our service for mechanical thrombectomy. EXAM: ULTRASOUND-GUIDED VASCULAR ACCESS DIAGNOSTIC CEREBRAL ANGIOGRAM MECHANICAL THROMBECTOMY TRANSCATHETER EMBOLIZATION FLAT PANEL HEAD CT COMPARISON:  CT/CT angiogram of the head and neck February 18, 2022. MEDICATIONS: No antibiotics utilized. ANESTHESIA/SEDATION: The procedure was performed under general anesthesia. CONTRAST:  110 mL of Omnipaque 300  milligram/mL. FLUOROSCOPY: Radiation Exposure Index (as provided by the fluoroscopic device): [id number] mGy Kerma COMPLICATIONS: SIR LEVEL B - Normal therapy, includes overnight admission for observation. TECHNIQUE: Informed written consent was obtained from the patient's niece after a thorough discussion of the procedural risks, benefits and alternatives. All questions were addressed. Maximal Sterile Barrier Technique was utilized including caps, mask, sterile gowns, sterile gloves, sterile drape, hand hygiene and skin antiseptic. A timeout was performed prior to the initiation of the procedure. Using the modified Seldinger technique and a micropuncture kit, access was gained to the right radial artery at the wrist and a 6 French sheath was placed. Real-time ultrasound guidance was utilized for vascular access including the acquisition of a permanent ultrasound image documenting patency of the accessed vessel. Slow intra arterial infusion of 200 mcg nitroglycerin diluted in patient's own blood was performed. No significant fluctuation in patient's blood pressure seen. Then, a right radial artery angiogram was obtained via sheath side port. Next, a 6 [place] benchmark catheter was navigated over a 0.035" Terumo Glidewire into the right subclavian artery under fluoroscopic guidance. Under fluoroscopy, the catheter was then advanced into the distal cervical segment of the right ICA. Frontal and lateral angiograms of the head were obtained. FINDINGS: 1. Normal brachial artery  branching pattern seen. No significant anatomical variation. The right radial artery caliber is adequate for vascular access. Atherosclerotic changes are noted along the right ulnar artery without hemodynamically significant stenosis. 2. 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 3. Occlusion of a dominant right M2/MCA anterior division branch. Luminal irregularity of the M1 and proximal M2 segments of the right MCA, consistent with atherosclerotic disease. Severe stenosis at the proximal right A1/ACA. 4. Opacification of the right PCA vascular tree via a prominent posterior communicating artery. Luminal irregularity noted along the left PCA, consistent with atherosclerotic disease with moderate stenosis at the distal P2 segment. PROCEDURE: Using biplane roadmap guidance, a 5 [place] Sof[place] aspiration catheter was navigated over a phenom 21 microcatheter and an Aristotle 14 microguidewire into the cavernous segment of the right ICA. The microcatheter was then navigated over the wire into the right M2/MCA anterior division branch. Then, a 3 x 20 mm solitaire stent retriever was deployed spanning the M2 segment. The device was allowed to intercalated with the clot for 4 minutes. The aspiration catheter was advanced to the M1 segment and connected to an aspiration pump. The thrombectomy device and aspiration catheter were removed under constant aspiration. The guide catheter was aspirated for debris. Right internal carotid artery angiograms with lateral views the showed partial recanalization of the right MCA (T2b) with 2 M3 branches to the perirolandic area occluded. Using biplane roadmap guidance, a 5 [place] Sof[place] aspiration catheter was navigated over a phenom 21 microcatheter and an Aristotle 14 microguidewire into the cavernous segment of the right ICA. Upon navigating  microcatheter over the wire into the right M2/MCA unexpected deflection of the catheter was noted. Frontal and lateral angiograms with microcatheter contrast injection were performed showing contrast extravasation. Transcatheter embolization of injured vessel was performed with 3 detachable coils (2 x 3 mm target XL soft (x2) and 2 x 3 cm target Nano). Follow-up right internal carotid artery angiograms with frontal and lateral views of the head showed in significant decrease of contrast extravasation with mild persistent extravasation. The microcatheter was again navigated into the affected branch. Microcatheter contrast injection  showed persistent contrast extravasation. Additional coil was placed more proximally (1.5 x 3 mm target 360 nano coil) resulting in branch occlusion. Right internal carotid artery angiograms with frontal and lateral views of the head showed occlusion of the right ICA terminus with new clot formation. Using biplane roadmap guidance, a 5 [place] Sof[place] aspiration catheter was navigated over an Aristotle 24 microguidewire into the cavernous segment of the right ICA. The aspiration catheter was then advanced to the level of occlusion and connected to an aspiration pump. Continuous aspiration was performed for 2 minutes. The guide catheter was connected to a VacLok syringe. The aspiration catheter was subsequently removed under constant aspiration. The guide catheter was aspirated for debris. Right internal carotid artery angiograms with frontal and lateral views of the head showed complete recanalization of the right ICA and M1 segment. The posterior division remains patent. However, the anterior division is occluded. Given hemorrhagic complication from minimal catheter manipulation, decision was made to abort the procedure. Flat panel CT of the head was obtained and post processed in a separate workstation with concurrent attending physician supervision. Selected images were sent to PACS.  Expected right sylvian fissure contrast extravasation. The catheter was subsequently withdrawn. An inflatable band was placed and inflated over the right wrist access site. The vascular sheath was withdrawn and the band was slowly deflated until brisk flow was noted through the arteriotomy site. At this point, the band was reinflated with additional 4 cc of air to obtain patent hemostasis. IMPRESSION: Mechanical thrombectomy performed for treatment of a dominant right 123456 occlusion complicated by vessel perforation treated with detachable coils. Given need for vessel sacrifice, final MCA status is TICI 2A, similar to the beginning of the procedure. PLAN: Refer to EMR. Electronically Signed   By: Pedro Earls M.D.   On: 02/20/2022 13:02   IR US Guide Vasc Access Right  Result Date: 02/20/2022 INDICATION: 77 year old female presenting with sudden onset left-sided weakness, aphasia and right gaze deviation; NIHSS 22 (later improved to 18). 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 Head CT showed a large acute territory infarct or hemorrhage. CT angiogram of the head and neck showed a right M2/MCA occlusion. He was taken to our service for mechanical thrombectomy. EXAM: ULTRASOUND-GUIDED VASCULAR ACCESS DIAGNOSTIC CEREBRAL ANGIOGRAM MECHANICAL THROMBECTOMY TRANSCATHETER EMBOLIZATION FLAT PANEL HEAD CT COMPARISON:  CT/CT angiogram of the head and neck February 18, 2022. MEDICATIONS: No antibiotics utilized. ANESTHESIA/SEDATION: The procedure was performed under general anesthesia. CONTRAST:  110 mL of Omnipaque 300 milligram/mL. FLUOROSCOPY: Radiation Exposure Index (as provided by the fluoroscopic device): [id number] mGy Kerma  COMPLICATIONS: SIR LEVEL B - Normal therapy, includes overnight admission for observation. TECHNIQUE: Informed written consent was obtained from the patient's niece after a thorough discussion of the procedural risks, benefits and alternatives. All questions were addressed. Maximal Sterile Barrier Technique was utilized including caps, mask, sterile gowns, sterile gloves, sterile drape, hand hygiene and skin antiseptic. A timeout was performed prior to the initiation of the procedure. Using the modified Seldinger technique and a micropuncture kit, access was gained to the right radial artery at the wrist and a 6 French sheath was placed. Real-time ultrasound guidance was utilized for vascular access including the acquisition of a permanent ultrasound image documenting patency of the accessed vessel. Slow intra arterial infusion  of 200 mcg nitroglycerin diluted in patient's own blood was performed. No significant fluctuation in patient's blood pressure seen. Then, a right radial artery angiogram was obtained via sheath side port. Next, a 6 [place] benchmark catheter was navigated over a 0.035" Terumo Glidewire into the right subclavian artery under fluoroscopic guidance. Under fluoroscopy, the catheter was then advanced into the distal cervical segment of the right ICA. Frontal and lateral angiograms of the head were obtained. FINDINGS: 1. Normal brachial artery branching pattern seen. No significant anatomical variation. The right radial artery caliber is adequate for vascular access. Atherosclerotic changes are noted along the right ulnar artery without hemodynamically significant stenosis. 2. 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 3. Occlusion of a dominant right M2/MCA anterior division branch. Luminal irregularity of the M1 and proximal M2 segments of the right MCA,  consistent with atherosclerotic disease. Severe stenosis at the proximal right A1/ACA. 4. Opacification of the right PCA vascular tree via a prominent posterior communicating artery. Luminal irregularity noted along the left PCA, consistent with atherosclerotic disease with moderate stenosis at the distal P2 segment. PROCEDURE: Using biplane roadmap guidance, a 5 [place] Sof[place] aspiration catheter was navigated over a phenom 21 microcatheter and an Aristotle 14 microguidewire into the cavernous segment of the right ICA. The microcatheter was then navigated over the wire into the right M2/MCA anterior division branch. Then, a 3 x 20 mm solitaire stent retriever was deployed spanning the M2 segment. The device was allowed to intercalated with the clot for 4 minutes. The aspiration catheter was advanced to the M1 segment and connected to an aspiration pump. The thrombectomy device and aspiration catheter were removed under constant aspiration. The guide catheter was aspirated for debris. Right internal carotid artery angiograms with lateral views the showed partial recanalization of the right MCA (T2b) with 2 M3 branches to the perirolandic area occluded. Using biplane roadmap guidance, a 5 [place] aspiration catheter was navigated over a phenom 21 microcatheter and an Aristotle 14 microguidewire into the cavernous segment of the right ICA. Upon navigating microcatheter over the wire into the right M2/MCA unexpected deflection of the catheter was noted. Frontal and lateral angiograms with microcatheter contrast injection were performed showing contrast extravasation. Transcatheter embolization of injured vessel was performed with 3 detachable coils (2 x 3 mm target XL soft (x2) and 2 x 3 cm target Nano). Follow-up right internal carotid artery angiograms with frontal and lateral views of the head showed in significant decrease of contrast extravasation with mild persistent extravasation. The microcatheter was again  navigated into the affected branch. Microcatheter contrast injection showed persistent contrast extravasation. Additional coil was placed more proximally (1.5 x 3 mm target 360 nano coil) resulting in branch occlusion. Right internal carotid artery angiograms with frontal and lateral views of the head showed occlusion of the right ICA terminus with new clot formation. Using biplane roadmap guidance, a 5 [place] aspiration catheter was navigated over an Aristotle 24 microguidewire into the cavernous segment of the right ICA. The aspiration catheter was then advanced to the level of occlusion and connected to an aspiration pump. Continuous aspiration was performed for 2 minutes. The guide catheter was connected to a VacLok syringe. The aspiration catheter was subsequently removed under constant aspiration. The guide catheter was aspirated for debris. Right internal carotid artery angiograms with frontal and lateral views of the head showed complete  recanalization of the right ICA and M1 segment. The posterior division remains patent. However, the anterior division is occluded. Given hemorrhagic complication from minimal catheter manipulation, decision was made to abort the procedure. Flat panel CT of the head was obtained and post processed in a separate workstation with concurrent attending physician supervision. Selected images were sent to PACS. Expected right sylvian fissure contrast extravasation. The catheter was subsequently withdrawn. An inflatable band was placed and inflated over the right wrist access site. The vascular sheath was withdrawn and the band was slowly deflated until brisk flow was noted through the arteriotomy site. At this point, the band was reinflated with additional 4 cc of air to obtain patent hemostasis. IMPRESSION: Mechanical thrombectomy performed for treatment of a dominant right 123456 occlusion complicated by vessel perforation treated with detachable coils. Given need for  vessel sacrifice, final MCA status is TICI 2A, similar to the beginning of the procedure. PLAN: Refer to EMR. Electronically Signed   By: Pedro Earls M.D.   On: 02/20/2022 13:02   IR Angiogram Follow Up Study  Result Date: 02/20/2022 INDICATION: 77 year old female presenting with sudden onset left-sided weakness, aphasia and right gaze deviation; NIHSS 22 (later improved to 18). 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 Head CT showed a large acute territory infarct or hemorrhage. CT angiogram of the head and neck showed a right M2/MCA occlusion. He was taken to our service for mechanical thrombectomy. EXAM: ULTRASOUND-GUIDED VASCULAR ACCESS DIAGNOSTIC CEREBRAL ANGIOGRAM MECHANICAL THROMBECTOMY TRANSCATHETER EMBOLIZATION FLAT PANEL HEAD CT COMPARISON:  CT/CT angiogram of the head and neck February 18, 2022. MEDICATIONS: No antibiotics utilized. ANESTHESIA/SEDATION: The procedure was performed under general anesthesia. CONTRAST:  110 mL of Omnipaque 300 milligram/mL. FLUOROSCOPY: Radiation Exposure Index (as provided by the fluoroscopic device): [id number] mGy Kerma COMPLICATIONS: SIR LEVEL B - Normal therapy, includes overnight admission for observation. TECHNIQUE: Informed written consent was obtained from the patient's niece after a thorough discussion of the procedural risks, benefits and alternatives. All questions were addressed. Maximal Sterile Barrier Technique was utilized including caps, mask, sterile gowns, sterile gloves, sterile drape, hand hygiene and skin antiseptic. A timeout was performed prior to the initiation of the procedure. Using the modified Seldinger technique and a micropuncture kit, access was gained to the  right radial artery at the wrist and a 6 French sheath was placed. Real-time ultrasound guidance was utilized for vascular access including the acquisition of a permanent ultrasound image documenting patency of the accessed vessel. Slow intra arterial infusion of 200 mcg nitroglycerin diluted in patient's own blood was performed. No significant fluctuation in patient's blood pressure seen. Then, a right radial artery angiogram was obtained via sheath side port. Next, a 6 [place] benchmark catheter was navigated over a 0.035" Terumo Glidewire into the right subclavian artery under fluoroscopic guidance. Under fluoroscopy, the catheter was then advanced into the distal cervical segment of the right ICA. Frontal and lateral angiograms of the head were obtained. FINDINGS: 1. Normal brachial artery branching pattern seen. No significant anatomical variation. The right radial artery caliber is adequate for vascular access. Atherosclerotic changes are noted along the right ulnar artery without hemodynamically significant stenosis. 2. 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 3. Occlusion of a dominant right M2/MCA anterior division branch. Luminal irregularity of the M1 and proximal M2 segments of the right MCA, consistent with atherosclerotic disease. Severe stenosis at the proximal right A1/ACA. 4. Opacification of the right PCA vascular tree via a prominent posterior communicating artery. Luminal irregularity noted along the left PCA, consistent with atherosclerotic disease with moderate stenosis at the distal P2 segment. PROCEDURE: Using biplane roadmap guidance, a 5 [place] aspiration catheter was navigated over a phenom 21 microcatheter and an Aristotle 14 microguidewire into the cavernous segment of the right ICA. The microcatheter was then navigated over the wire  into the right M2/MCA anterior division branch. Then, a 3 x 20 mm solitaire stent retriever was deployed spanning the M2 segment. The device was allowed to intercalated with the clot for 4 minutes. The aspiration catheter was advanced to the M1 segment and connected to an aspiration pump. The thrombectomy device and aspiration catheter were removed under constant aspiration. The guide catheter was aspirated for debris. Right internal carotid artery angiograms with lateral views the showed partial recanalization of the right MCA (T2b) with 2 M3 branches to the perirolandic area occluded. Using biplane roadmap guidance, a 5 [place] aspiration catheter was navigated over a phenom 21 microcatheter and an Aristotle 14 microguidewire into the cavernous segment of the right ICA. Upon navigating microcatheter over the wire into the right M2/MCA unexpected deflection of the catheter was noted. Frontal and lateral angiograms with microcatheter contrast injection were performed showing contrast extravasation. Transcatheter embolization of injured vessel was performed with 3 detachable coils (2 x 3 mm target XL soft (x2) and 2 x 3 cm target Nano). Follow-up right internal carotid artery angiograms with frontal and lateral views of the head showed in significant decrease of contrast extravasation with mild persistent extravasation. The microcatheter was again navigated into the affected branch. Microcatheter contrast injection showed persistent contrast extravasation. Additional coil was placed more proximally (1.5 x 3 mm target 360 nano coil) resulting in branch occlusion. Right internal carotid artery angiograms with frontal and lateral views of the head showed occlusion of the right ICA terminus with new clot formation. Using biplane roadmap guidance, a 5 [place] aspiration catheter was navigated over an Aristotle 24 microguidewire into the cavernous segment of the right ICA. The aspiration catheter was then  advanced to the level of occlusion and connected to an aspiration pump. Continuous aspiration was performed for 2 minutes. The guide catheter was connected to a VacLok syringe. The aspiration catheter was subsequently removed under constant aspiration. The guide catheter was aspirated for debris. Right internal carotid artery angiograms with frontal and lateral views of the head showed complete recanalization of the right ICA and M1 segment. The posterior division remains patent. However, the anterior division is occluded. Given hemorrhagic complication from minimal catheter manipulation, decision was made to abort the procedure. Flat panel CT of the head was obtained and post processed in a separate workstation with concurrent attending physician supervision. Selected images were sent to PACS. Expected right sylvian fissure contrast extravasation. The catheter was subsequently withdrawn. An inflatable band was placed and inflated over the right wrist access site. The vascular sheath was withdrawn and the band was slowly deflated until brisk flow was noted through the arteriotomy site. At this point, the band was reinflated with additional 4 cc of air to obtain patent hemostasis. IMPRESSION: Mechanical thrombectomy performed for treatment of a dominant right 123456 occlusion complicated by vessel perforation  treated with detachable coils. Given need for vessel sacrifice, final MCA status is TICI 2A, similar to the beginning of the procedure. PLAN: Refer to EMR. Electronically Signed   By: Pedro Earls M.D.   On: 02/20/2022 13:02   IR Angiogram Follow Up Study  Result Date: 02/20/2022 INDICATION: 77 year old female presenting with sudden onset left-sided weakness, aphasia and right gaze deviation; NIHSS 22 (later improved to 18). 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 Head CT showed a large acute territory infarct or hemorrhage. CT angiogram of the head and neck showed a right M2/MCA occlusion. He was taken to our service for mechanical thrombectomy. EXAM: ULTRASOUND-GUIDED VASCULAR ACCESS DIAGNOSTIC CEREBRAL ANGIOGRAM MECHANICAL THROMBECTOMY TRANSCATHETER EMBOLIZATION FLAT PANEL HEAD CT COMPARISON:  CT/CT angiogram of the head and neck February 18, 2022. MEDICATIONS: No antibiotics utilized. ANESTHESIA/SEDATION: The procedure was performed under general anesthesia. CONTRAST:  110 mL of Omnipaque 300 milligram/mL. FLUOROSCOPY: Radiation Exposure Index (as provided by the fluoroscopic device): [id number] mGy Kerma COMPLICATIONS: SIR LEVEL B - Normal therapy, includes overnight admission for observation. TECHNIQUE: Informed written consent was obtained from the patient's niece after a thorough discussion of the procedural risks, benefits and alternatives. All questions were addressed. Maximal Sterile Barrier Technique was utilized including caps, mask, sterile gowns, sterile gloves, sterile drape, hand hygiene and skin antiseptic. A timeout was performed prior to the initiation of the procedure. Using the modified Seldinger technique and a micropuncture kit, access was gained to the right radial artery at the wrist and a 6 French sheath was placed. Real-time ultrasound guidance was utilized for vascular access including the acquisition of a permanent ultrasound image documenting patency of the accessed vessel. Slow intra arterial infusion of 200 mcg nitroglycerin diluted in patient's own blood was performed. No significant fluctuation in patient's blood pressure seen. Then, a right radial artery angiogram was obtained via sheath side port. Next, a 6 [place] benchmark catheter was navigated over a 0.035" Terumo Glidewire into the right subclavian  artery under fluoroscopic guidance. Under fluoroscopy, the catheter was then advanced into the distal cervical segment of the right ICA. Frontal and lateral angiograms of the head were obtained. FINDINGS: 1. Normal brachial artery branching pattern seen. No significant anatomical variation. The right radial artery caliber is adequate for vascular access. Atherosclerotic changes are noted along the right ulnar artery without hemodynamically significant stenosis. 2. 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 3. Occlusion of a dominant right M2/MCA anterior division branch. Luminal irregularity of the M1 and proximal M2 segments of the right MCA, consistent with atherosclerotic disease. Severe stenosis at the proximal right A1/ACA. 4. Opacification of the right PCA vascular tree via a prominent posterior communicating artery. Luminal irregularity noted along the left PCA, consistent with atherosclerotic disease with moderate stenosis at the distal P2 segment. PROCEDURE: Using biplane roadmap guidance, a 5 [place] aspiration catheter was navigated over a phenom 21 microcatheter and an Aristotle 14 microguidewire into the cavernous segment of the right ICA. The microcatheter was then navigated over the wire into the right M2/MCA anterior division branch. Then, a 3 x 20 mm solitaire stent retriever was deployed spanning the M2 segment. The device was  allowed to intercalated with the clot for 4 minutes. The aspiration catheter was advanced to the M1 segment and connected to an aspiration pump. The thrombectomy device and aspiration catheter were removed under constant aspiration. The guide catheter was aspirated for debris. Right internal carotid artery angiograms with lateral views the showed partial recanalization of the right MCA (T2b) with 2 M3 branches to the  perirolandic area occluded. Using biplane roadmap guidance, a 5 [place] aspiration catheter was navigated over a phenom 21 microcatheter and an Aristotle 14 microguidewire into the cavernous segment of the right ICA. Upon navigating microcatheter over the wire into the right M2/MCA unexpected deflection of the catheter was noted. Frontal and lateral angiograms with microcatheter contrast injection were performed showing contrast extravasation. Transcatheter embolization of injured vessel was performed with 3 detachable coils (2 x 3 mm target XL soft (x2) and 2 x 3 cm target Nano). Follow-up right internal carotid artery angiograms with frontal and lateral views of the head showed in significant decrease of contrast extravasation with mild persistent extravasation. The microcatheter was again navigated into the affected branch. Microcatheter contrast injection showed persistent contrast extravasation. Additional coil was placed more proximally (1.5 x 3 mm target 360 nano coil) resulting in branch occlusion. Right internal carotid artery angiograms with frontal and lateral views of the head showed occlusion of the right ICA terminus with new clot formation. Using biplane roadmap guidance, a 5 [place] aspiration catheter was navigated over an Aristotle 24 microguidewire into the cavernous segment of the right ICA. The aspiration catheter was then advanced to the level of occlusion and connected to an aspiration pump. Continuous aspiration was performed for 2 minutes. The guide catheter was connected to a VacLok syringe. The aspiration catheter was subsequently removed under constant aspiration. The guide catheter was aspirated for debris. Right internal carotid artery angiograms with frontal and lateral views of the head showed complete recanalization of the right ICA and M1 segment. The posterior division remains patent. However, the anterior division is occluded. Given hemorrhagic complication from minimal  catheter manipulation, decision was made to abort the procedure. Flat panel CT of the head was obtained and post processed in a separate workstation with concurrent attending physician supervision. Selected images were sent to PACS. Expected right sylvian fissure contrast extravasation. The catheter was subsequently withdrawn. An inflatable band was placed and inflated over the right wrist access site. The vascular sheath was withdrawn and the band was slowly deflated until brisk flow was noted through the arteriotomy site. At this point, the band was reinflated with additional 4 cc of air to obtain patent hemostasis. IMPRESSION: Mechanical thrombectomy performed for treatment of a dominant right 123456 occlusion complicated by vessel perforation treated with detachable coils. Given need for vessel sacrifice, final MCA status is TICI 2A, similar to the beginning of the procedure. PLAN: Refer to EMR. Electronically Signed   By: Pedro Earls M.D.   On: 02/20/2022 13:02   IR Angiogram Follow Up Study  Result Date: 02/20/2022 INDICATION: 77 year old female presenting with sudden onset left-sided weakness, aphasia and right gaze deviation; NIHSS 22 (later improved to 18). 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 Head CT showed a large acute territory infarct or hemorrhage. CT angiogram of the head and neck showed a right M2/MCA occlusion.  He was taken to our service for mechanical thrombectomy. EXAM: ULTRASOUND-GUIDED VASCULAR ACCESS DIAGNOSTIC CEREBRAL ANGIOGRAM MECHANICAL THROMBECTOMY TRANSCATHETER EMBOLIZATION FLAT PANEL HEAD CT COMPARISON:  CT/CT angiogram of the head and neck February 18, 2022. MEDICATIONS: No antibiotics utilized.  ANESTHESIA/SEDATION: The procedure was performed under general anesthesia. CONTRAST:  110 mL of Omnipaque 300 milligram/mL. FLUOROSCOPY: Radiation Exposure Index (as provided by the fluoroscopic device): [id number] mGy Kerma COMPLICATIONS: SIR LEVEL B - Normal therapy, includes overnight admission for observation. TECHNIQUE: Informed written consent was obtained from the patient's niece after a thorough discussion of the procedural risks, benefits and alternatives. All questions were addressed. Maximal Sterile Barrier Technique was utilized including caps, mask, sterile gowns, sterile gloves, sterile drape, hand hygiene and skin antiseptic. A timeout was performed prior to the initiation of the procedure. Using the modified Seldinger technique and a micropuncture kit, access was gained to the right radial artery at the wrist and a 6 French sheath was placed. Real-time ultrasound guidance was utilized for vascular access including the acquisition of a permanent ultrasound image documenting patency of the accessed vessel. Slow intra arterial infusion of 200 mcg nitroglycerin diluted in patient's own blood was performed. No significant fluctuation in patient's blood pressure seen. Then, a right radial artery angiogram was obtained via sheath side port. Next, a 6 [place] benchmark catheter was navigated over a 0.035" Terumo Glidewire into the right subclavian artery under fluoroscopic guidance. Under fluoroscopy, the catheter was then advanced into the distal cervical segment of the right ICA. Frontal and lateral angiograms of the head were obtained. FINDINGS: 1. Normal brachial artery branching pattern seen. No significant anatomical variation. The right radial artery caliber is adequate for vascular access. Atherosclerotic changes are noted along the right ulnar artery without hemodynamically significant stenosis. 2. 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 3. Occlusion of a dominant right M2/MCA anterior division branch. Luminal irregularity of the M1 and proximal M2 segments of the right MCA, consistent with atherosclerotic disease. Severe stenosis at the proximal right A1/ACA. 4. Opacification of the right PCA vascular tree via a prominent posterior communicating artery. Luminal irregularity noted along the left PCA, consistent with atherosclerotic disease with moderate stenosis at the distal P2 segment. PROCEDURE: Using biplane roadmap guidance, a 5 [place] aspiration catheter was navigated over a phenom 21 microcatheter and an Aristotle 14 microguidewire into the cavernous segment of the right ICA. The microcatheter was then navigated over the wire into the right M2/MCA anterior division branch. Then, a 3 x 20 mm solitaire stent retriever was deployed spanning the M2 segment. The device was allowed to intercalated with the clot for 4 minutes. The aspiration catheter was advanced to the M1 segment and connected to an aspiration pump. The thrombectomy device and aspiration catheter were removed under constant aspiration. The guide catheter was aspirated for debris. Right internal carotid artery angiograms with lateral views the showed partial recanalization of the right MCA (T2b) with 2 M3 branches to the perirolandic area occluded. Using biplane roadmap guidance, a 5 [place] aspiration catheter was navigated over a phenom 21 microcatheter and an Aristotle 14 microguidewire into the cavernous segment of the right ICA. Upon navigating microcatheter over the wire into the right M2/MCA unexpected deflection of the catheter was noted. Frontal and lateral angiograms with microcatheter contrast injection were performed showing contrast extravasation. Transcatheter embolization of injured vessel was performed with  3 detachable coils (2 x 3 mm target XL soft (x2) and 2 x 3 cm target  Nano). Follow-up right internal carotid artery angiograms with frontal and lateral views of the head showed in significant decrease of contrast extravasation with mild persistent extravasation. The microcatheter was again navigated into the affected branch. Microcatheter contrast injection showed persistent contrast extravasation. Additional coil was placed more proximally (1.5 x 3 mm target 360 nano coil) resulting in branch occlusion. Right internal carotid artery angiograms with frontal and lateral views of the head showed occlusion of the right ICA terminus with new clot formation. Using biplane roadmap guidance, a 5 [place] aspiration catheter was navigated over an Aristotle 24 microguidewire into the cavernous segment of the right ICA. The aspiration catheter was then advanced to the level of occlusion and connected to an aspiration pump. Continuous aspiration was performed for 2 minutes. The guide catheter was connected to a VacLok syringe. The aspiration catheter was subsequently removed under constant aspiration. The guide catheter was aspirated for debris. Right internal carotid artery angiograms with frontal and lateral views of the head showed complete recanalization of the right ICA and M1 segment. The posterior division remains patent. However, the anterior division is occluded. Given hemorrhagic complication from minimal catheter manipulation, decision was made to abort the procedure. Flat panel CT of the head was obtained and post processed in a separate workstation with concurrent attending physician supervision. Selected images were sent to PACS. Expected right sylvian fissure contrast extravasation. The catheter was subsequently withdrawn. An inflatable band was placed and inflated over the right wrist access site. The vascular sheath was withdrawn and the band was slowly deflated until brisk flow was noted through the arteriotomy site. At this point, the band was reinflated with additional 4  cc of air to obtain patent hemostasis. IMPRESSION: Mechanical thrombectomy performed for treatment of a dominant right 123456 occlusion complicated by vessel perforation treated with detachable coils. Given need for vessel sacrifice, final MCA status is TICI 2A, similar to the beginning of the procedure. PLAN: Refer to EMR. Electronically Signed   By: Pedro Earls M.D.   On: 02/20/2022 13:02   IR Angiogram Follow Up Study  Result Date: 02/20/2022 INDICATION: 77 year old female presenting with sudden onset left-sided weakness, aphasia and right gaze deviation; NIHSS 22 (later improved to 18). 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 Head CT showed a large acute territory infarct or hemorrhage. CT angiogram of the head and neck showed a right M2/MCA occlusion. He was taken to our service for mechanical thrombectomy. EXAM: ULTRASOUND-GUIDED VASCULAR ACCESS DIAGNOSTIC CEREBRAL ANGIOGRAM MECHANICAL THROMBECTOMY TRANSCATHETER EMBOLIZATION FLAT PANEL HEAD CT COMPARISON:  CT/CT angiogram of the head and neck February 18, 2022. MEDICATIONS: No antibiotics utilized. ANESTHESIA/SEDATION: The procedure was performed under general anesthesia. CONTRAST:  110 mL of Omnipaque 300 milligram/mL. FLUOROSCOPY: Radiation Exposure Index (as provided by the fluoroscopic device): [id number] mGy Kerma COMPLICATIONS: SIR LEVEL B - Normal therapy, includes overnight admission for observation. TECHNIQUE: Informed written consent was obtained from the patient's niece after a thorough discussion of the procedural risks, benefits and alternatives. All questions were addressed. Maximal Sterile Barrier Technique was utilized including caps, mask, sterile gowns, sterile  gloves, sterile drape, hand hygiene and skin antiseptic. A timeout was performed prior to the initiation of  the procedure. Using the modified Seldinger technique and a micropuncture kit, access was gained to the right radial artery at the wrist and a 6 French sheath was placed. Real-time ultrasound guidance was utilized for vascular access including the acquisition of a permanent ultrasound image documenting patency of the accessed vessel. Slow intra arterial infusion of 200 mcg nitroglycerin diluted in patient's own blood was performed. No significant fluctuation in patient's blood pressure seen. Then, a right radial artery angiogram was obtained via sheath side port. Next, a 6 [place] benchmark catheter was navigated over a 0.035" Terumo Glidewire into the right subclavian artery under fluoroscopic guidance. Under fluoroscopy, the catheter was then advanced into the distal cervical segment of the right ICA. Frontal and lateral angiograms of the head were obtained. FINDINGS: 1. Normal brachial artery branching pattern seen. No significant anatomical variation. The right radial artery caliber is adequate for vascular access. Atherosclerotic changes are noted along the right ulnar artery without hemodynamically significant stenosis. 2. 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 3. Occlusion of a dominant right M2/MCA anterior division branch. Luminal irregularity of the M1 and proximal M2 segments of the right MCA, consistent with atherosclerotic disease. Severe stenosis at the proximal right A1/ACA. 4. Opacification of the right PCA vascular tree via a prominent posterior communicating artery. Luminal irregularity noted along the left PCA, consistent with atherosclerotic disease with moderate stenosis at the distal P2 segment. PROCEDURE: Using biplane roadmap guidance, a 5  [place] Sof[place] aspiration catheter was navigated over a phenom 21 microcatheter and an Aristotle 14 microguidewire into the cavernous segment of the right ICA. The microcatheter was then navigated over the wire into the right M2/MCA anterior division branch. Then, a 3 x 20 mm solitaire stent retriever was deployed spanning the M2 segment. The device was allowed to intercalated with the clot for 4 minutes. The aspiration catheter was advanced to the M1 segment and connected to an aspiration pump. The thrombectomy device and aspiration catheter were removed under constant aspiration. The guide catheter was aspirated for debris. Right internal carotid artery angiograms with lateral views the showed partial recanalization of the right MCA (T2b) with 2 M3 branches to the perirolandic area occluded. Using biplane roadmap guidance, a 5 [place] Sof[place] aspiration catheter was navigated over a phenom 21 microcatheter and an Aristotle 14 microguidewire into the cavernous segment of the right ICA. Upon navigating microcatheter over the wire into the right M2/MCA unexpected deflection of the catheter was noted. Frontal and lateral angiograms with microcatheter contrast injection were performed showing contrast extravasation. Transcatheter embolization of injured vessel was performed with 3 detachable coils (2 x 3 mm target XL soft (x2) and 2 x 3 cm target Nano). Follow-up right internal carotid artery angiograms with frontal and lateral views of the head showed in significant decrease of contrast extravasation with mild persistent extravasation. The microcatheter was again navigated into the affected branch. Microcatheter contrast injection showed persistent contrast extravasation. Additional coil was placed more proximally (1.5 x 3 mm target 360 nano coil) resulting in branch occlusion. Right internal carotid artery angiograms with frontal and lateral views of the head showed occlusion of the right ICA terminus with new clot  formation. Using biplane roadmap guidance, a 5 [place] Sofia aspiration catheter was navigated over an Aristotle 24 microguidewire into the cavernous segment of the right ICA. The aspiration catheter was then advanced to the level  of occlusion and connected to an aspiration pump. Continuous aspiration was performed for 2 minutes. The guide catheter was connected to a VacLok syringe. The aspiration catheter was subsequently removed under constant aspiration. The guide catheter was aspirated for debris. Right internal carotid artery angiograms with frontal and lateral views of the head showed complete recanalization of the right ICA and M1 segment. The posterior division remains patent. However, the anterior division is occluded. Given hemorrhagic complication from minimal catheter manipulation, decision was made to abort the procedure. Flat panel CT of the head was obtained and post processed in a separate workstation with concurrent attending physician supervision. Selected images were sent to PACS. Expected right sylvian fissure contrast extravasation. The catheter was subsequently withdrawn. An inflatable band was placed and inflated over the right wrist access site. The vascular sheath was withdrawn and the band was slowly deflated until brisk flow was noted through the arteriotomy site. At this point, the band was reinflated with additional 4 cc of air to obtain patent hemostasis. IMPRESSION: Mechanical thrombectomy performed for treatment of a dominant right 123456 occlusion complicated by vessel perforation treated with detachable coils. Given need for vessel sacrifice, final MCA status is TICI 2A, similar to the beginning of the procedure. PLAN: Refer to EMR. Electronically Signed   By: Pedro Earls M.D.   On: 02/20/2022 13:02   DG Abd Portable 1V  Result Date: 02/19/2022 CLINICAL DATA:  Enteric tube placement EXAM: PORTABLE ABDOMEN - 1 VIEW COMPARISON:  Abdominal radiograph dated  02/19/2022 at 12:20 a.m. FINDINGS: Enteric tube has been advanced with tip and side hole projecting over the gastric body. Partially imaged pacemaker leads project over the right atrium and ventricle. Partially imaged aorto bi-iliac stent graft. Diffusely dilated gas-filled bowel loops are again seen. IMPRESSION: Enteric tube has been advanced with tip and side hole projecting over the gastric body. Electronically Signed   By: Darrin Nipper M.D.   On: 02/19/2022 16:21   MR BRAIN WO CONTRAST  Result Date: 02/19/2022 CLINICAL DATA:  Right MCA infarct.  Status post thrombectomy. EXAM: MRI HEAD WITHOUT CONTRAST TECHNIQUE: Multiplanar, multiecho pulse sequences of the brain and surrounding structures were obtained without intravenous contrast. COMPARISON:  CT/CTA/CTP head 1 day prior. FINDINGS: Brain: There is extensive diffusion restriction with associated FLAIR signal abnormality throughout much of the right MCA distribution involving the lentiform nucleus, insula, corona radiata, and frontal, parietal, and temporal lobe cortex. There are scattered additional smaller infarcts in the bilateral cerebellar hemispheres and bilateral supratentorial cortex which are likely embolic in etiology. There is susceptibility artifact overlying the right cerebral hemisphere primarily in the sylvian fissure with associated sulcal FLAIR hyperintensity likely reflecting subarachnoid hemorrhage (Heidelberg classification 3c: Subarachnoid hemorrhage). Edema from the right MCA infarct results in sulcal effacement and partial effacement of the right lateral ventricle but no midline shift. There are additional remote infarcts in the bilateral basal ganglia/corona radiata and background advanced chronic small-vessel ischemic change. There are numerous foci of SWI signal dropout throughout both cerebral hemispheres primarily peripherally distributed. There is no solid mass lesion. Vascular: The proximal flow voids of the major intracranial  vasculature are normal. Skull and upper cervical spine: Choose Sinuses/Orbits: There is mild mucosal thickening in the paranasal sinuses. The globes and orbits are unremarkable. Other: There is a probable sebaceous cyst in the right cheek. IMPRESSION: 1. Large acute infarct in the right MCA distribution with edema and mild regional mass effect but no midline shift. 2. Numerous additional smaller infarcts in  the bilateral cerebral and cerebellar hemispheres are likely embolic in nature. 3. Subarachnoid hemorrhage primarily in the right sylvian fissure. 4. Numerous peripherally distributed punctate chronic microhemorrhages suspicious for cerebral amyloid angiopathy. Electronically Signed   By: Valetta Mole M.D.   On: 02/19/2022 15:50   ECHOCARDIOGRAM COMPLETE  Result Date: 02/19/2022    ECHOCARDIOGRAM REPORT   Patient Name:   Shannon Chen Date of Exam: 02/19/2022 Medical Rec #:  [id number]       Height:       65.5 in Accession #:    [id number]      Weight:       133.4 lb Date of Birth:  1945-09-22       BSA:          1.675 m Patient Age:    31 years        BP:           115/31 mmHg Patient Gender: F               HR:           83 bpm. Exam Location:  Inpatient Procedure: 2D Echo, Cardiac Doppler and Color Doppler Indications:    Stroke I63.9  History:        Patient has prior history of Echocardiogram examinations, most                 recent 12/30/2020. Cardiomyopathy, Previous Myocardial                 Infarction and CAD, PAD and COPD, Arrythmias:RBBB and Atrial                 Fibrillation, Signs/Symptoms:Hypotension and Syncope; Risk                 Factors:Hypertension, Diabetes, Current Smoker and Dyslipidemia.  Sonographer:    Ronny Flurry Referring Phys: [id number] [place]  1. Left ventricular ejection fraction, by estimation, is 70 to 75%. The left ventricle has hyperdynamic function. The left ventricle has no regional wall motion abnormalities. There is moderate concentric left  ventricular hypertrophy. Left ventricular diastolic parameters are consistent with Grade II diastolic dysfunction (pseudonormalization).  2. Right ventricular systolic function is normal. The right ventricular size is normal. There is normal pulmonary artery systolic pressure.  3. The mitral valve is degenerative. Mild to moderate mitral valve regurgitation. Moderate mitral annular calcification.  4. The aortic valve is abnormal. Aortic valve regurgitation is mild to moderate. Mild aortic valve stenosis.  5. The inferior vena cava is normal in size with greater than 50% respiratory variability, suggesting right atrial pressure of 3 mmHg. Comparison(s): Prior images reviewed side by side. Aortic valve is not well visualized in patients past studies or recent CT. Aortic regurgitation not seen in prior study. FINDINGS  Left Ventricle: Left ventricular ejection fraction, by estimation, is 70 to 75%. The left ventricle has hyperdynamic function. The left ventricle has no regional wall motion abnormalities. The left ventricular internal cavity size was normal in size. There is moderate concentric left ventricular hypertrophy. Left ventricular diastolic parameters are consistent with Grade II diastolic dysfunction (pseudonormalization). Right Ventricle: The right ventricular size is normal. No increase in right ventricular wall thickness. Right ventricular systolic function is normal. There is normal pulmonary artery systolic pressure. The tricuspid regurgitant velocity is 2.58 m/s, and  with an assumed right atrial pressure of 3 mmHg, the estimated right ventricular systolic pressure is 0000000 mmHg. Left Atrium: Left atrial size  was normal in size. Right Atrium: Right atrial size was normal in size. Pericardium: There is no evidence of pericardial effusion. Mitral Valve: The mitral valve is degenerative in appearance. Moderate mitral annular calcification. Mild to moderate mitral valve regurgitation. Tricuspid Valve: The  tricuspid valve is normal in structure. Tricuspid valve regurgitation is trivial. No evidence of tricuspid stenosis. Aortic Valve: The aortic valve is abnormal. Aortic valve regurgitation is mild to moderate. Mild aortic stenosis is present. Aortic valve mean gradient measures 17.0 mmHg. Aortic valve peak gradient measures 29.9 mmHg. Aortic valve area, by VTI measures 1.93 cm. Pulmonic Valve: The pulmonic valve was not well visualized. Pulmonic valve regurgitation is mild to moderate. No evidence of pulmonic stenosis. Aorta: The aortic root and ascending aorta are structurally normal, with no evidence of dilitation. Venous: The inferior vena cava is normal in size with greater than 50% respiratory variability, suggesting right atrial pressure of 3 mmHg. IAS/Shunts: The interatrial septum appears to be lipomatous. No atrial level shunt detected by color flow Doppler. Additional Comments: A device lead is visualized.  LEFT VENTRICLE PLAX 2D LVIDd:         4.40 cm   Diastology LVIDs:         2.40 cm   LV e' medial:    5.22 cm/s LV PW:         1.10 cm   LV E/e' medial:  28.5 LV IVS:        1.30 cm   LV e' lateral:   7.51 cm/s LVOT diam:     2.00 cm   LV E/e' lateral: 19.8 LV SV:         101 LV SV Index:   60 LVOT Area:     3.14 cm                           3D Volume EF:                          3D EF:        62 %                          LV EDV:       138 ml                          LV ESV:       52 ml                          LV SV:        86 ml RIGHT VENTRICLE             IVC RV S prime:     13.10 cm/s  IVC diam: 1.25 cm TAPSE (M-mode): 2.2 cm LEFT ATRIUM             Index        RIGHT ATRIUM          Index LA diam:        3.70 cm 2.21 cm/m   RA Area:     9.49 cm LA Vol (A2C):   34.7 ml 20.72 ml/m  RA Volume:   17.00 ml 10.15 ml/m LA Vol (A4C):   62.1 ml 37.08 ml/m LA Biplane Vol: 49.1 ml 29.32 ml/m  AORTIC VALVE AV  Area (Vmax):    1.92 cm AV Area (Vmean):   1.89 cm AV Area (VTI):     1.93 cm AV Vmax:            273.33 cm/s AV Vmean:          192.000 cm/s AV VTI:            0.524 m AV Peak Grad:      29.9 mmHg AV Mean Grad:      17.0 mmHg LVOT Vmax:         167.00 cm/s LVOT Vmean:        115.667 cm/s LVOT VTI:          0.322 m LVOT/AV VTI ratio: 0.61  AORTA Ao Root diam: 2.90 cm Ao Asc diam:  2.70 cm MITRAL VALVE                TRICUSPID VALVE MV Area (PHT): 4.89 cm     TR Peak grad:   26.6 mmHg MV Decel Time: 155 msec     TR Vmax:        258.00 cm/s MR Peak grad: 101.2 mmHg MR Mean grad: 84.0 mmHg     SHUNTS MR Vmax:      503.00 cm/s   Systemic VTI:  0.32 m MR Vmean:     436.0 cm/s    Systemic Diam: 2.00 cm MV E velocity: 149.00 cm/s MV A velocity: 118.00 cm/s MV E/A ratio:  1.26 Rudean Haskell MD Electronically signed by Rudean Haskell MD Signature Date/Time: 02/19/2022/11:36:45 AM    Final    DG Abd Portable 1V  Result Date: 02/19/2022 CLINICAL DATA:  Check gastric catheter placement EXAM: PORTABLE ABDOMEN - 1 VIEW COMPARISON:  None Available. FINDINGS: Scattered large and small bowel gas is noted. Aortic stent graft is noted. Renal excretion of contrast is noted related to prior vascular procedure. Gastric catheter is noted with the tip in the stomach. Proximal side port however lies at the gastroesophageal junction. This should be advanced deeper into the stomach. IMPRESSION: Gastric catheter is described which should be advanced deeper into the stomach. Electronically Signed   By: Inez Catalina M.D.   On: 02/19/2022 00:36   CT CEREBRAL PERFUSION W CONTRAST  Result Date: 02/18/2022 CLINICAL DATA:  Left facial droop EXAM: CT PERFUSION BRAIN TECHNIQUE: Multiphase CT imaging of the brain was performed following IV bolus contrast injection. Subsequent parametric perfusion maps were calculated using RAPID software. RADIATION DOSE REDUCTION: This exam was performed according to the departmental dose-optimization program which includes automated exposure control, adjustment of the mA and/or kV according to  patient size and/or use of iterative reconstruction technique. CONTRAST:  21m OMNIPAQUE IOHEXOL 350 MG/ML SOLN COMPARISON:  None Available. FINDINGS: CT Brain Perfusion Findings: CBF (<30%) Volume: 335mPerfusion (Tmax>6.0s) volume: 3728mismatch Volume: 5mL58mPECTS on noncontrast CT Head: 10 at 7:47 p.m. today. Infarct Core: 32 mL Infarction Location:Anterior right MCA territory IMPRESSION: A 32 mL core infarct with 5 mL penumbra. Electronically Signed   By: KeviUlyses Jarred.   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 EXAM: CT ANGIOGRAPHY CHEST, ABDOMEN AND PELVIS TECHNIQUE: Non-contrast CT of the chest was initially obtained. Multidetector CT imaging through the chest, abdomen and pelvis was performed using the standard protocol during bolus administration of intravenous contrast. Multiplanar reconstructed images and MIPs were obtained and reviewed to  evaluate the vascular anatomy. RADIATION DOSE REDUCTION: This exam was performed according to the departmental dose-optimization program which includes automated exposure control, adjustment of the mA and/or kV according to patient size and/or use of iterative reconstruction technique. CONTRAST:  53m OMNIPAQUE IOHEXOL 350 MG/ML SOLN COMPARISON:  December 15, 2021 FINDINGS: CTA CHEST FINDINGS Cardiovascular: A dual lead AICD is noted. There is marked severity calcification of the thoracic aorta. The distal aspect of the descending thoracic aorta is tortuous in appearance and measures approximately 3.1 cm x 2.8 cm. Satisfactory opacification of the pulmonary arteries to the segmental level. No evidence of pulmonary embolism. Normal heart size with marked severity coronary artery calcification. No pericardial effusion. Mediastinum/Nodes: No enlarged mediastinal, hilar, or axillary lymph nodes. Thyroid gland, trachea, and esophagus  demonstrate no significant findings. Lungs/Pleura: Mild, bilateral atelectatic changes are seen without evidence of acute infiltrate, pleural effusion or pneumothorax. Musculoskeletal: No chest wall abnormality. No acute or significant osseous findings. Review of the MIP images confirms the above findings. CTA ABDOMEN AND PELVIS FINDINGS VASCULAR Aorta: Patent aortoiliac stent graft, without evidence of stent occlusion or stent leak. Celiac: Marked severity calcification without evidence of aneurysm, dissection, vasculitis or significant stenosis. SMA: Marked severity calcification without evidence of aneurysm, dissection, vasculitis or significant stenosis. Renals: Both renal arteries are patent without evidence of aneurysm, dissection, vasculitis, fibromuscular dysplasia or significant stenosis. IMA: Patent without evidence of aneurysm, dissection, vasculitis or significant stenosis. 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 Veins: No obvious venous abnormality within the limitations of this arterial phase study. Review of the MIP images confirms the above findings. NON-VASCULAR Hepatobiliary: No focal liver abnormality is seen. No gallstones or gallbladder wall thickening. The common bile duct measures 11.6 mm in diameter. Pancreas: There is mild pancreatic ductal dilatation without evidence of surrounding inflammatory changes. Spleen: A 3.1 cm x 2.1 cm cystic appearing area is seen within the anterior aspect of the spleen. Adrenals/Urinary Tract: There is diffuse bilateral adrenal gland enlargement, left greater than right. Kidneys are normal in size, without renal calculi or hydronephrosis. An 8 mm diameter simple cyst is seen within the anterolateral aspect of the mid left kidney. Bladder is unremarkable. A 4.8 cm x 4.0 cm cystic appearing structure is seen adjacent to the  anterolateral aspect of the urinary bladder on the left. This represents a new finding when compared to the prior study. Stomach/Bowel: Stomach is within normal limits. Appendix appears normal. No evidence of bowel wall thickening, distention, or inflammatory changes. Noninflamed diverticula are seen throughout the descending and sigmoid colon. Lymphatic: No abnormal abdominal or pelvic lymph nodes are identified. Reproductive: Status post hysterectomy. Other: A 2.1 cm x 1.4 cm fat containing ventral hernia is noted along the mid abdomen. No abdominopelvic ascites. Musculoskeletal: Multilevel degenerative changes seen throughout the lumbar spine. Review of the MIP images confirms the above findings. IMPRESSION: 1. No evidence of pulmonary embolism or other acute intrathoracic process. 2. Patent aortoiliac stent graft without evidence of stent occlusion or stent leak. 3. 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 4. Colonic diverticulosis. 5. Cystic appearing area adjacent to the anterolateral aspect of the urinary bladder on the left, which represents a new finding when compared to the prior study. This may represent a large bladder diverticulum. 6. Stable  splenic cyst. 7. Small fat containing ventral hernia along the mid abdomen. 8. Aortic atherosclerosis. Aortic Atherosclerosis (ICD10-I70.0). Electronically Signed   By: Virgina Norfolk M.D.   On: 02/18/2022 20:25   CT HEAD CODE STROKE WO CONTRAST  Result Date: 02/18/2022 CLINICAL DATA:  Left facial droop and right gaze deviation EXAM: CT HEAD WITHOUT CONTRAST CT ANGIOGRAPHY OF THE HEAD AND NECK TECHNIQUE: Contiguous axial images were obtained from the base of the skull through the vertex without intravenous contrast. Multidetector CT imaging of the head and neck was performed using the standard protocol during bolus administration  of intravenous contrast. Multiplanar CT image reconstructions and MIPs were obtained to evaluate the vascular anatomy. Carotid stenosis measurements (when applicable) are obtained utilizing NASCET criteria, using the distal internal carotid diameter as the denominator. RADIATION DOSE REDUCTION: This exam was performed according to the departmental dose-optimization program which includes automated exposure control, adjustment of the mA and/or kV according to patient size and/or use of iterative reconstruction technique. CONTRAST:  42m OMNIPAQUE IOHEXOL 350 MG/ML SOLN COMPARISON:  12/09/2021 head CT FINDINGS: CT HEAD Brain: No acute intracranial hemorrhage. 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 There is periventricular hypoattenuation compatible with chronic microvascular disease. Generalized volume loss. Vascular: Atherosclerosis of the internal carotid arteries at the skull base. Skull: Normal. Sinuses/Orbits: Bilateral posterior ethmoid air cell mucosal thickening. Orbits are normal. ASPECTS ([place]roke Program Early CT Score) - Ganglionic level infarction (caudate, lentiform nuclei, internal capsule, insula, M1-M3 cortex): 7 - Supraganglionic infarction (M4-M6 cortex): 3 Total score (0-10 with 10 being normal): 10 Review of the MIP images confirms the above findings CTA NECK Aortic arch: Aortic atherosclerosis.  Normal branching pattern. Right carotid system: Atherosclerosis throughout the course of the right common carotid artery. The bifurcation is widely patent. No hemodynamically significant stenosis of the right ICA. Left carotid system: Common carotid atherosclerosis without stenosis. Atherosclerotic calcification at the bifurcation extending into the ICA resulting in 60% stenosis. The distal ICA is widely patent. Vertebral arteries:Left dominant. Multifocal atherosclerosis along the course of both vertebral arteries resulting in mild stenosis of the left mid V2 segment head  multifocal severe stenosis of the right P2 segment. Both vertebral arteries remain patent to the skull base. Skeleton: Negative Other neck: Unremarkable. Upper chest: Clear CTA HEAD Anterior circulation: There is calcific atherosclerosis of both internal carotid arteries at the skull base with mild stenosis bilaterally. The anterior cerebral arteries are normal. The right MCA is patent proximally in the M1 segment is normal. There is an early right bifurcation. There is occlusion of a M2 superior division branch in the anterior sylvian fissure (series 8, image 109). The left MCA is normal. Posterior circulation: Both P comms are patent. There is atherosclerotic irregularity of the basilar artery with moderate stenosis of the midportion. The diminutive right vertebral artery terminates in PICA. Both PCAs are patent. There is mild atherosclerotic irregularity bilaterally. Venous sinuses: Normal allowing for venous dominant contrast phase. Anatomic variants: Bilateral fetal origins of the posterior cerebral arteries. Delayed phase: None IMPRESSION: 1. Occlusion of a right M2 superior division branch in the anterior Sylvian fissure. No other intracranial occlusion. 2. No acute intracranial hemorrhage. ASPECTS is 10. 3. 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 4. 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 5. 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 Aortic Atherosclerosis (ICD10-I70.0). These results were called by telephone at the time of interpretation on 02/18/2022 at 8:10 pm  to provider [facility] , who verbally acknowledged these results. Electronically Signed   By: Ulyses Jarred M.D.   On: 02/18/2022 20:23   CT ANGIO HEAD NECK W WO CM (CODE STROKE)  Result Date: 02/18/2022 CLINICAL DATA:  Left facial droop and right gaze deviation EXAM: CT HEAD WITHOUT CONTRAST CT ANGIOGRAPHY OF THE HEAD AND NECK TECHNIQUE: Contiguous axial images were obtained  from the base of the skull through the vertex without intravenous contrast. Multidetector CT imaging of the head and neck was performed using the standard protocol during bolus administration of intravenous contrast. Multiplanar CT image reconstructions and MIPs were obtained to evaluate the vascular anatomy. Carotid stenosis measurements (when applicable) are obtained utilizing NASCET criteria, using the distal internal carotid diameter as the denominator. RADIATION DOSE REDUCTION: This exam was performed according to the departmental dose-optimization program which includes automated exposure control, adjustment of the mA and/or kV according to patient size and/or use of iterative reconstruction technique. CONTRAST:  9m OMNIPAQUE IOHEXOL 350 MG/ML SOLN COMPARISON:  12/09/2021 head CT FINDINGS: CT HEAD Brain: No acute intracranial hemorrhage. 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 There is periventricular hypoattenuation compatible with chronic microvascular disease. Generalized volume loss. Vascular: Atherosclerosis of the internal carotid arteries at the skull base. Skull: Normal. Sinuses/Orbits: Bilateral posterior ethmoid air cell mucosal thickening. Orbits are normal. ASPECTS ([place]roke Program Early CT Score) - Ganglionic level infarction (caudate, lentiform nuclei, internal capsule, insula, M1-M3 cortex): 7 - Supraganglionic infarction (M4-M6 cortex): 3 Total score (0-10 with 10 being normal): 10 Review of the MIP images confirms the above findings CTA NECK Aortic arch: Aortic atherosclerosis.  Normal branching pattern. Right carotid system: Atherosclerosis throughout the course of the right common carotid artery. The bifurcation is widely patent. No hemodynamically significant stenosis of the right ICA. Left carotid system: Common carotid atherosclerosis without stenosis. Atherosclerotic calcification at the bifurcation extending into the ICA resulting in 60% stenosis. The distal  ICA is widely patent. Vertebral arteries:Left dominant. Multifocal atherosclerosis along the course of both vertebral arteries resulting in mild stenosis of the left mid V2 segment head multifocal severe stenosis of the right P2 segment. Both vertebral arteries remain patent to the skull base. Skeleton: Negative Other neck: Unremarkable. Upper chest: Clear CTA HEAD Anterior circulation: There is calcific atherosclerosis of both internal carotid arteries at the skull base with mild stenosis bilaterally. The anterior cerebral arteries are normal. The right MCA is patent proximally in the M1 segment is normal. There is an early right bifurcation. There is occlusion of a M2 superior division branch in the anterior sylvian fissure (series 8, image 109). The left MCA is normal. Posterior circulation: Both P comms are patent. There is atherosclerotic irregularity of the basilar artery with moderate stenosis of the midportion. The diminutive right vertebral artery terminates in PICA. Both PCAs are patent. There is mild atherosclerotic irregularity bilaterally. Venous sinuses: Normal allowing for venous dominant contrast phase. Anatomic variants: Bilateral fetal origins of the posterior cerebral arteries. Delayed phase: None IMPRESSION: 1. Occlusion of a right M2 superior division branch in the anterior Sylvian fissure. No other intracranial occlusion. 2. No acute intracranial hemorrhage. ASPECTS is 10. 3. 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 4. 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 5. 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 Aortic Atherosclerosis (ICD10-I70.0). These results were called by telephone at the time of interpretation on 02/18/2022 at 8:10 pm [facility]  who verbally acknowledged these results. Electronically Signed   By: Ulyses Jarred M.D.   On: 02/18/2022 20:23     Assessment/Plan There are no diagnoses linked to  this encounter.   [name], MD  03/05/2022 7:52 AM

## 2022-03-06 ENCOUNTER — Ambulatory Visit (INDEPENDENT_AMBULATORY_CARE_PROVIDER_SITE_OTHER): Payer: Medicare HMO | Admitting: Vascular Surgery

## 2022-03-20 DEATH — deceased

## 2022-03-31 ENCOUNTER — Ambulatory Visit: Payer: Medicare HMO | Admitting: Podiatry

## 2022-05-08 ENCOUNTER — Ambulatory Visit: Payer: Medicare HMO | Admitting: Podiatry
